# Patient Record
Sex: Male | Born: 1960 | Race: Black or African American | Hispanic: No | Marital: Single | Smoking: Current every day smoker
Health system: Southern US, Community
[De-identification: ages and names within clinical notes are randomized; demographics above are authoritative.]

## PROBLEM LIST (undated history)

## (undated) DIAGNOSIS — M545 Low back pain, unspecified: Secondary | ICD-10-CM

## (undated) DIAGNOSIS — R569 Unspecified convulsions: Secondary | ICD-10-CM

## (undated) DIAGNOSIS — M1990 Unspecified osteoarthritis, unspecified site: Secondary | ICD-10-CM

## (undated) DIAGNOSIS — D649 Anemia, unspecified: Secondary | ICD-10-CM

## (undated) DIAGNOSIS — J45909 Unspecified asthma, uncomplicated: Secondary | ICD-10-CM

## (undated) DIAGNOSIS — F1010 Alcohol abuse, uncomplicated: Secondary | ICD-10-CM

## (undated) DIAGNOSIS — I10 Essential (primary) hypertension: Secondary | ICD-10-CM

## (undated) DIAGNOSIS — J449 Chronic obstructive pulmonary disease, unspecified: Secondary | ICD-10-CM

## (undated) DIAGNOSIS — I82409 Acute embolism and thrombosis of unspecified deep veins of unspecified lower extremity: Secondary | ICD-10-CM

## (undated) DIAGNOSIS — R0602 Shortness of breath: Secondary | ICD-10-CM

## (undated) DIAGNOSIS — G8929 Other chronic pain: Secondary | ICD-10-CM

## (undated) DIAGNOSIS — E119 Type 2 diabetes mellitus without complications: Secondary | ICD-10-CM

## (undated) HISTORY — PX: NO PAST SURGERIES: SHX2092

---

## 2000-01-18 ENCOUNTER — Encounter: Payer: Self-pay | Admitting: Emergency Medicine

## 2000-01-18 ENCOUNTER — Emergency Department (HOSPITAL_COMMUNITY): Admission: EM | Admit: 2000-01-18 | Discharge: 2000-01-19 | Payer: Self-pay | Admitting: Emergency Medicine

## 2001-03-12 ENCOUNTER — Emergency Department (HOSPITAL_COMMUNITY): Admission: EM | Admit: 2001-03-12 | Discharge: 2001-03-12 | Payer: Self-pay | Admitting: Emergency Medicine

## 2002-02-08 ENCOUNTER — Emergency Department (HOSPITAL_COMMUNITY): Admission: EM | Admit: 2002-02-08 | Discharge: 2002-02-08 | Payer: Self-pay | Admitting: Emergency Medicine

## 2002-02-08 ENCOUNTER — Encounter: Payer: Self-pay | Admitting: Emergency Medicine

## 2002-03-08 ENCOUNTER — Emergency Department (HOSPITAL_COMMUNITY): Admission: EM | Admit: 2002-03-08 | Discharge: 2002-03-08 | Payer: Self-pay | Admitting: Emergency Medicine

## 2003-06-28 ENCOUNTER — Emergency Department (HOSPITAL_COMMUNITY): Admission: EM | Admit: 2003-06-28 | Discharge: 2003-06-28 | Payer: Self-pay | Admitting: Emergency Medicine

## 2003-09-10 ENCOUNTER — Encounter: Payer: Self-pay | Admitting: Emergency Medicine

## 2003-09-10 ENCOUNTER — Emergency Department (HOSPITAL_COMMUNITY): Admission: EM | Admit: 2003-09-10 | Discharge: 2003-09-10 | Payer: Self-pay | Admitting: Emergency Medicine

## 2004-10-09 ENCOUNTER — Ambulatory Visit: Payer: Self-pay | Admitting: Nurse Practitioner

## 2004-10-10 ENCOUNTER — Ambulatory Visit: Payer: Self-pay | Admitting: *Deleted

## 2004-11-02 ENCOUNTER — Ambulatory Visit: Payer: Self-pay | Admitting: Nurse Practitioner

## 2004-12-03 ENCOUNTER — Emergency Department (HOSPITAL_COMMUNITY): Admission: AC | Admit: 2004-12-03 | Discharge: 2004-12-04 | Payer: Self-pay
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
## 2005-03-03 ENCOUNTER — Emergency Department (HOSPITAL_COMMUNITY): Admission: EM | Admit: 2005-03-03 | Discharge: 2005-03-03 | Payer: Self-pay | Admitting: Emergency Medicine

## 2005-03-24 ENCOUNTER — Emergency Department (HOSPITAL_COMMUNITY): Admission: EM | Admit: 2005-03-24 | Discharge: 2005-03-24 | Payer: Self-pay | Admitting: Emergency Medicine

## 2006-04-30 ENCOUNTER — Emergency Department (HOSPITAL_COMMUNITY): Admission: EM | Admit: 2006-04-30 | Discharge: 2006-04-30 | Payer: Self-pay | Admitting: Emergency Medicine
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
## 2006-07-01 ENCOUNTER — Emergency Department (HOSPITAL_COMMUNITY): Admission: EM | Admit: 2006-07-01 | Discharge: 2006-07-01 | Payer: Self-pay | Admitting: *Deleted

## 2006-08-29 ENCOUNTER — Emergency Department (HOSPITAL_COMMUNITY): Admission: EM | Admit: 2006-08-29 | Discharge: 2006-08-29 | Payer: Self-pay | Admitting: *Deleted

## 2006-10-01 ENCOUNTER — Emergency Department (HOSPITAL_COMMUNITY): Admission: EM | Admit: 2006-10-01 | Discharge: 2006-10-01 | Payer: Self-pay | Admitting: Emergency Medicine

## 2006-12-11 ENCOUNTER — Emergency Department (HOSPITAL_COMMUNITY): Admission: EM | Admit: 2006-12-11 | Discharge: 2006-12-12 | Payer: Self-pay | Admitting: Emergency Medicine

## 2007-04-29 ENCOUNTER — Inpatient Hospital Stay (HOSPITAL_COMMUNITY): Admission: EM | Admit: 2007-04-29 | Discharge: 2007-05-02 | Payer: Self-pay | Admitting: Emergency Medicine

## 2007-04-29 IMAGING — CR DG HAND 3V BILAT
6 series · 6 of 6 positions shown · non-contrast
Comparison: none

CLINICAL DATA: Bilateral metacarpal pain. 
 BILATERAL HANDS ? 3 VIEW: 
 LEFT HAND:

[x hand ap left]
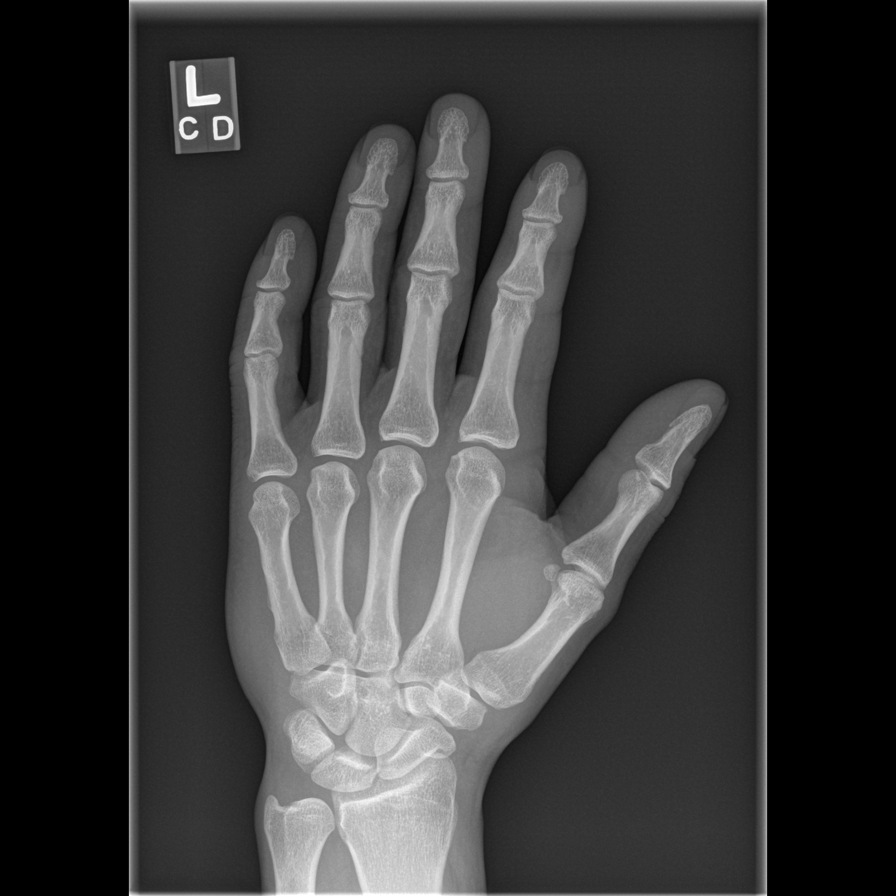

[x hand oblique left]
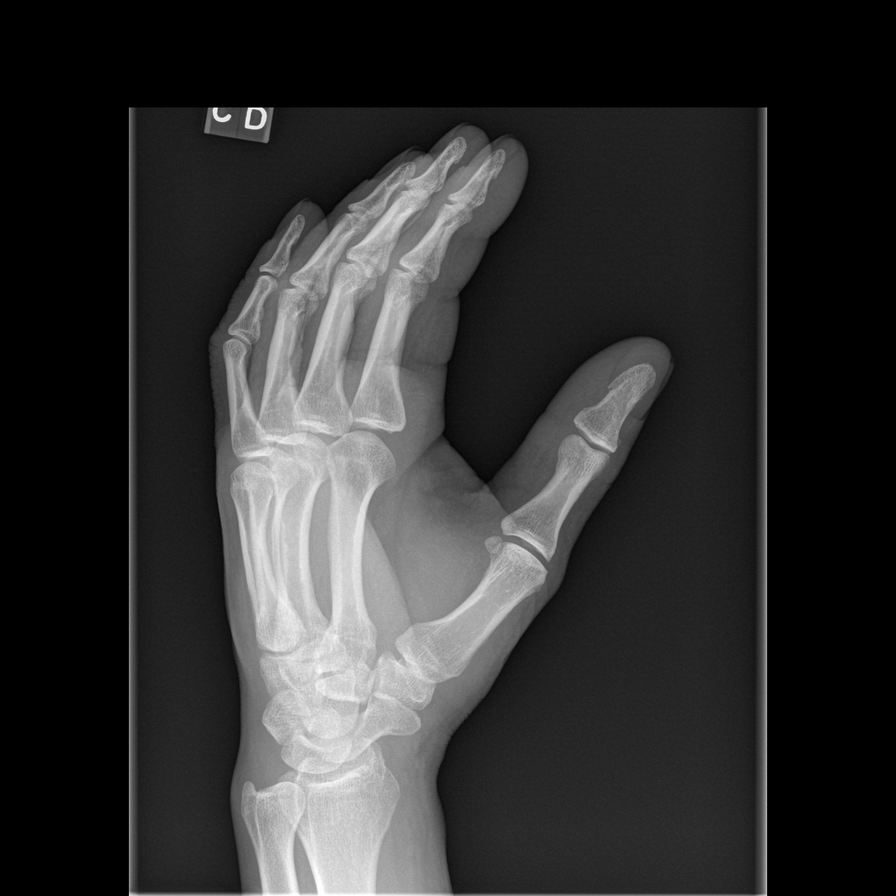

[x hand lat left]
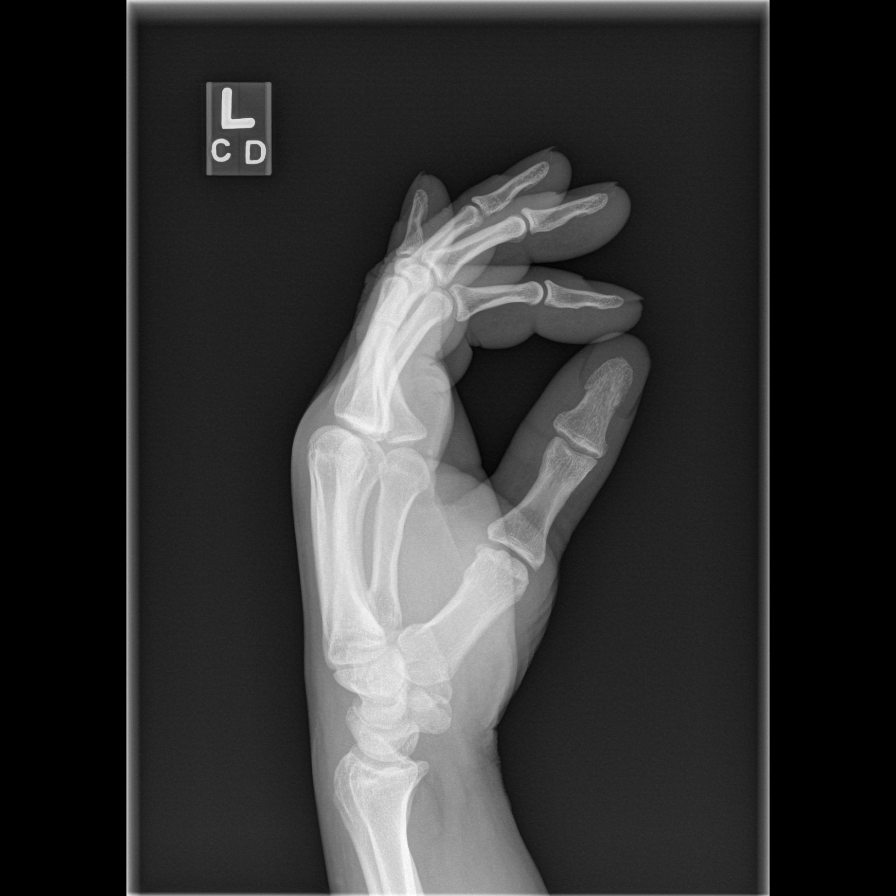

[x hand ap right]
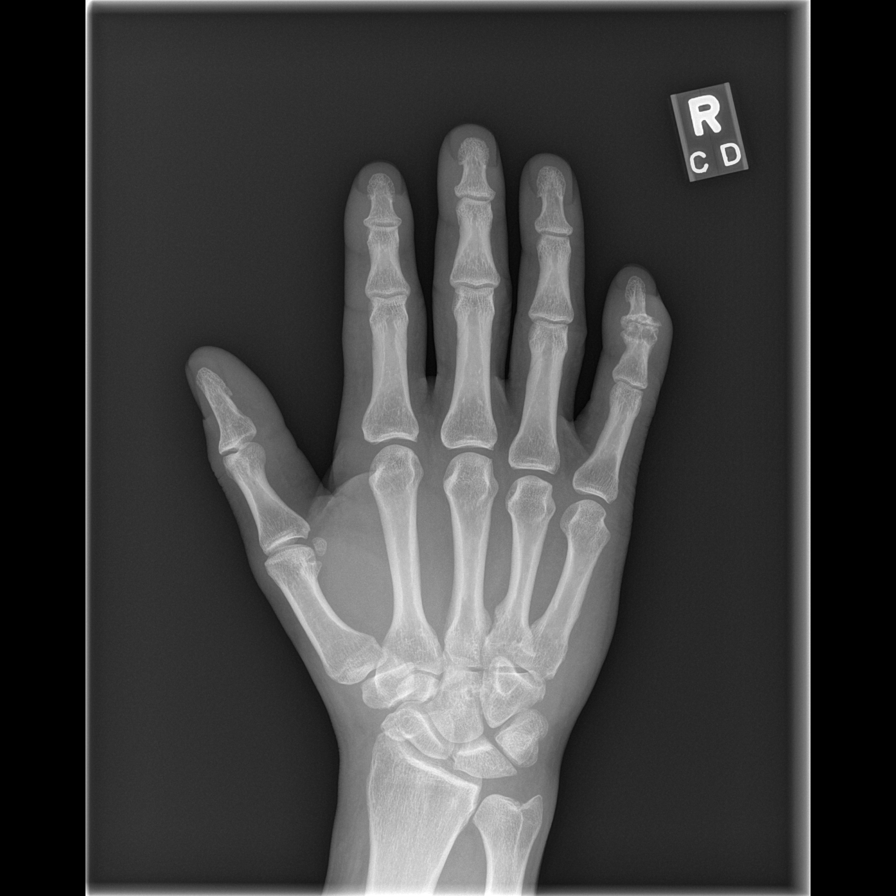

[x hand oblique right]
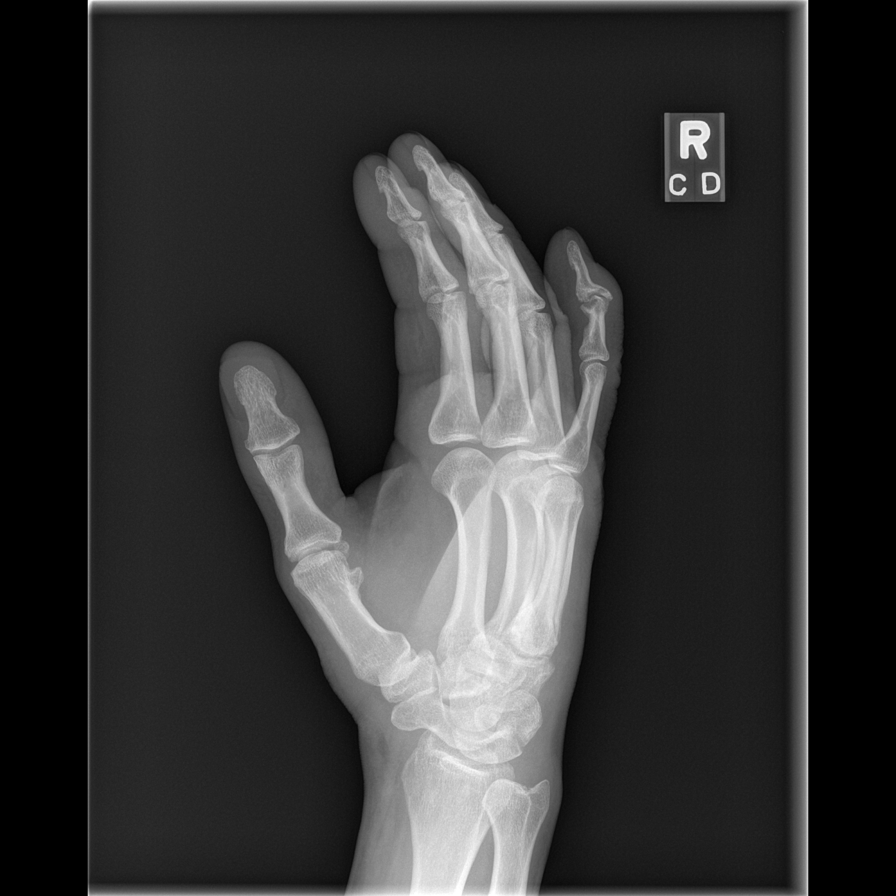

[x hand lat right]
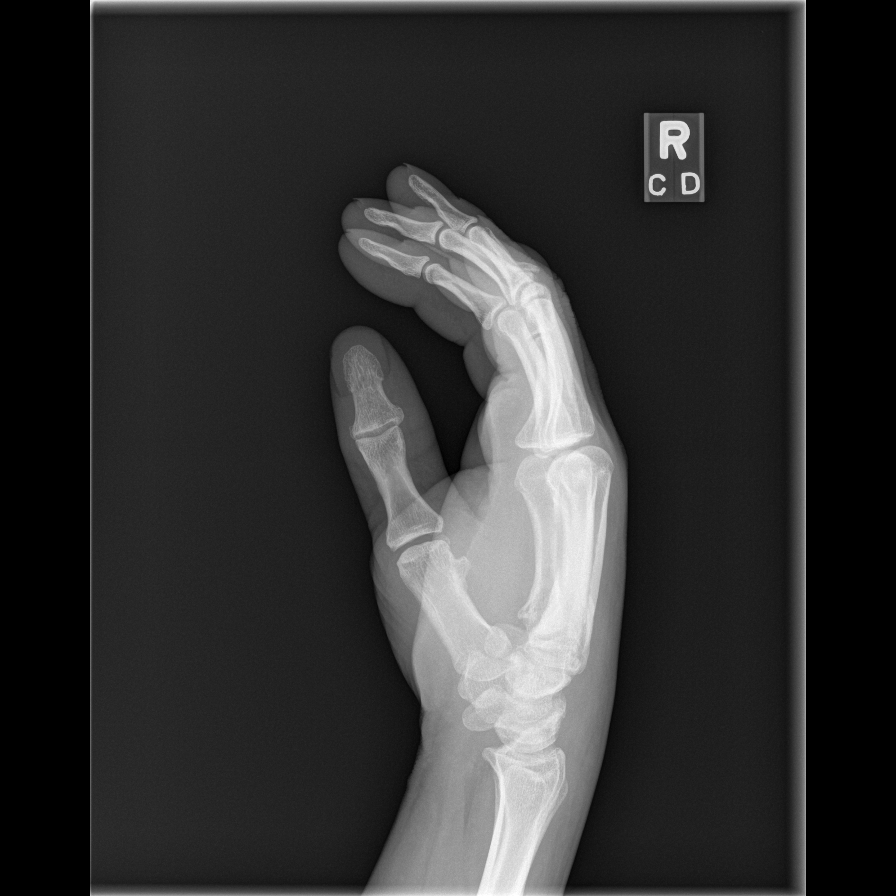

[6 of 6 positions shown; findings below may reference images not displayed]

FINDINGS: MCP joint spaces are well maintained as all of the joint spaces throughout the carpus and fingers.   No erosions are identified.  No soft tissue swelling.  There is no fracture.  
 RIGHT HAND:
 Three views of the right hand also demonstrate well-maintained MCP joint spaces.   There is marked degenerative change of the DIP joint of the little finger with prominent osteophytes identified.  There is no fracture.
IMPRESSION: No acute finding or evidence of inflammatory arthropathy in either hand.  Marked degenerative changes of the dip joint and the little finger of the right hand is noted.

## 2007-07-20 ENCOUNTER — Emergency Department (HOSPITAL_COMMUNITY): Admission: EM | Admit: 2007-07-20 | Discharge: 2007-07-20 | Payer: Self-pay | Admitting: Emergency Medicine

## 2007-08-12 ENCOUNTER — Emergency Department (HOSPITAL_COMMUNITY): Admission: EM | Admit: 2007-08-12 | Discharge: 2007-08-12 | Payer: Self-pay | Admitting: *Deleted

## 2007-08-20 ENCOUNTER — Encounter (INDEPENDENT_AMBULATORY_CARE_PROVIDER_SITE_OTHER): Payer: Self-pay | Admitting: *Deleted

## 2008-02-02 ENCOUNTER — Emergency Department (HOSPITAL_COMMUNITY): Admission: EM | Admit: 2008-02-02 | Discharge: 2008-02-02 | Payer: Self-pay | Admitting: Emergency Medicine

## 2008-03-05 ENCOUNTER — Emergency Department (HOSPITAL_COMMUNITY): Admission: EM | Admit: 2008-03-05 | Discharge: 2008-03-05 | Payer: Self-pay | Admitting: Emergency Medicine

## 2008-06-18 ENCOUNTER — Emergency Department (HOSPITAL_COMMUNITY): Admission: EM | Admit: 2008-06-18 | Discharge: 2008-06-18 | Payer: Self-pay | Admitting: Emergency Medicine

## 2008-06-18 ENCOUNTER — Encounter (INDEPENDENT_AMBULATORY_CARE_PROVIDER_SITE_OTHER): Payer: Self-pay | Admitting: Emergency Medicine

## 2008-06-18 ENCOUNTER — Ambulatory Visit: Payer: Self-pay | Admitting: Vascular Surgery

## 2008-06-18 IMAGING — CR DG SHOULDER 2+V*R*
3 series · 3 of 3 positions shown · non-contrast
Comparison: None

CLINICAL DATA: Pain.  Swelling.

RIGHT SHOULDER - 2+ VIEW

[w shoulder ap external right]
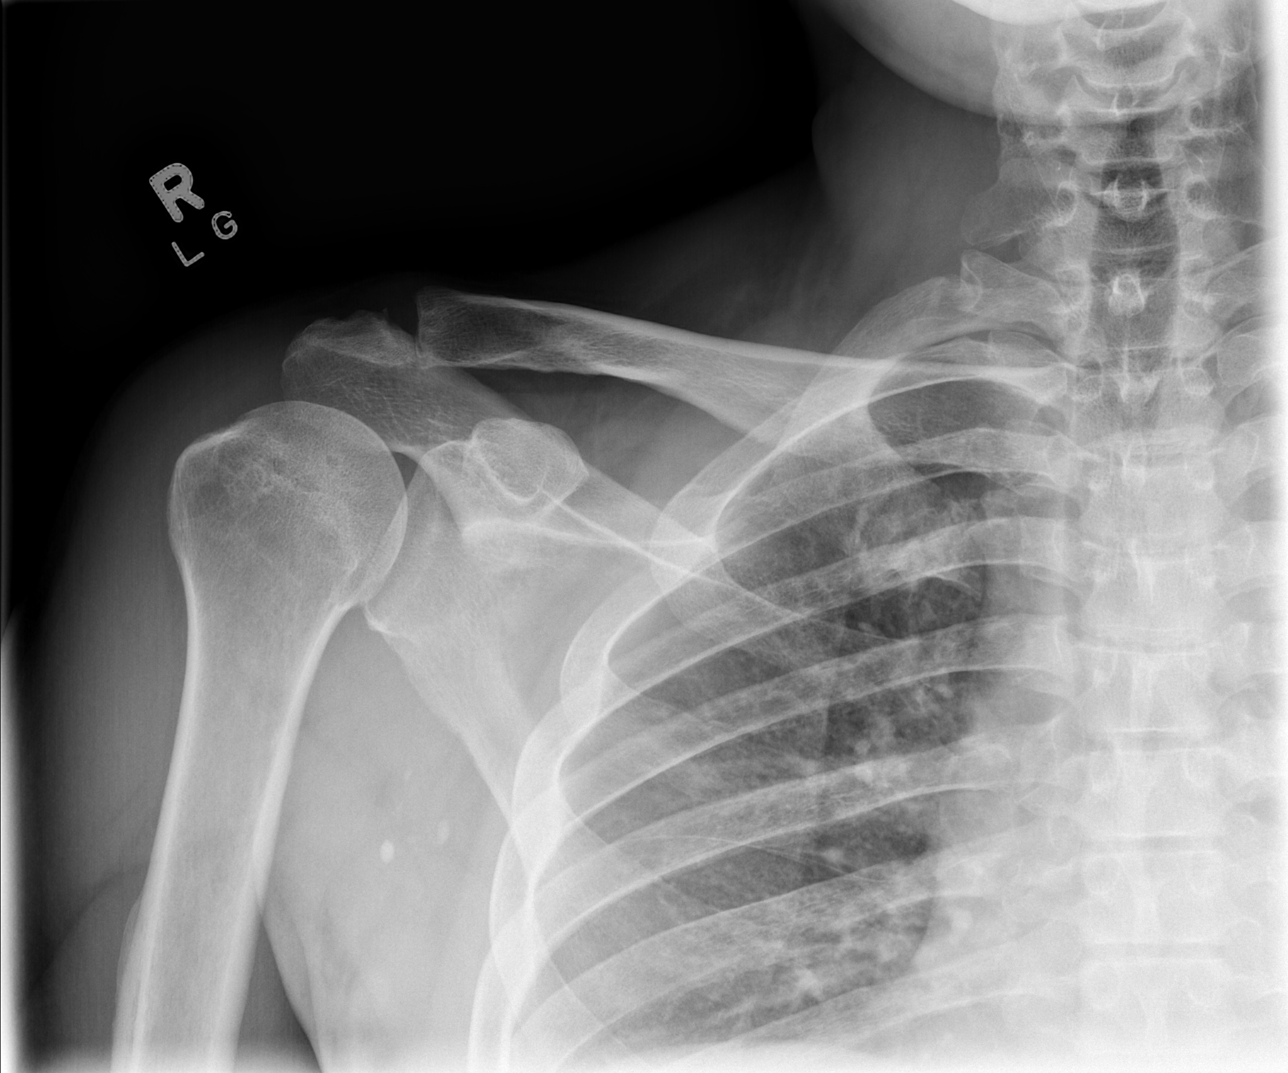

[w shoulder ap internal right]
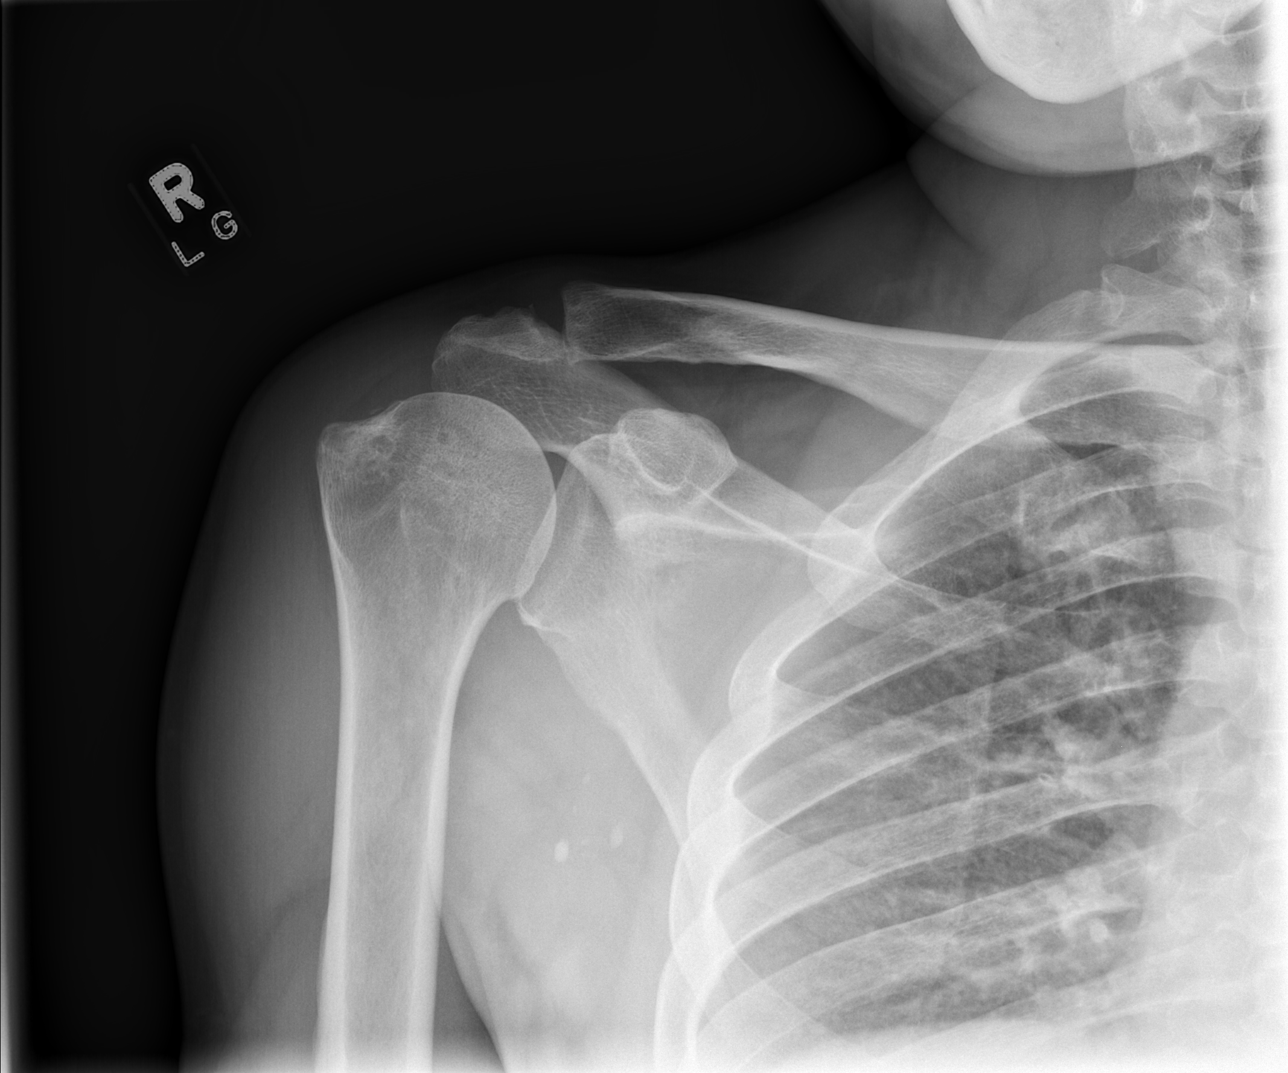

[w shoulder y view right *]
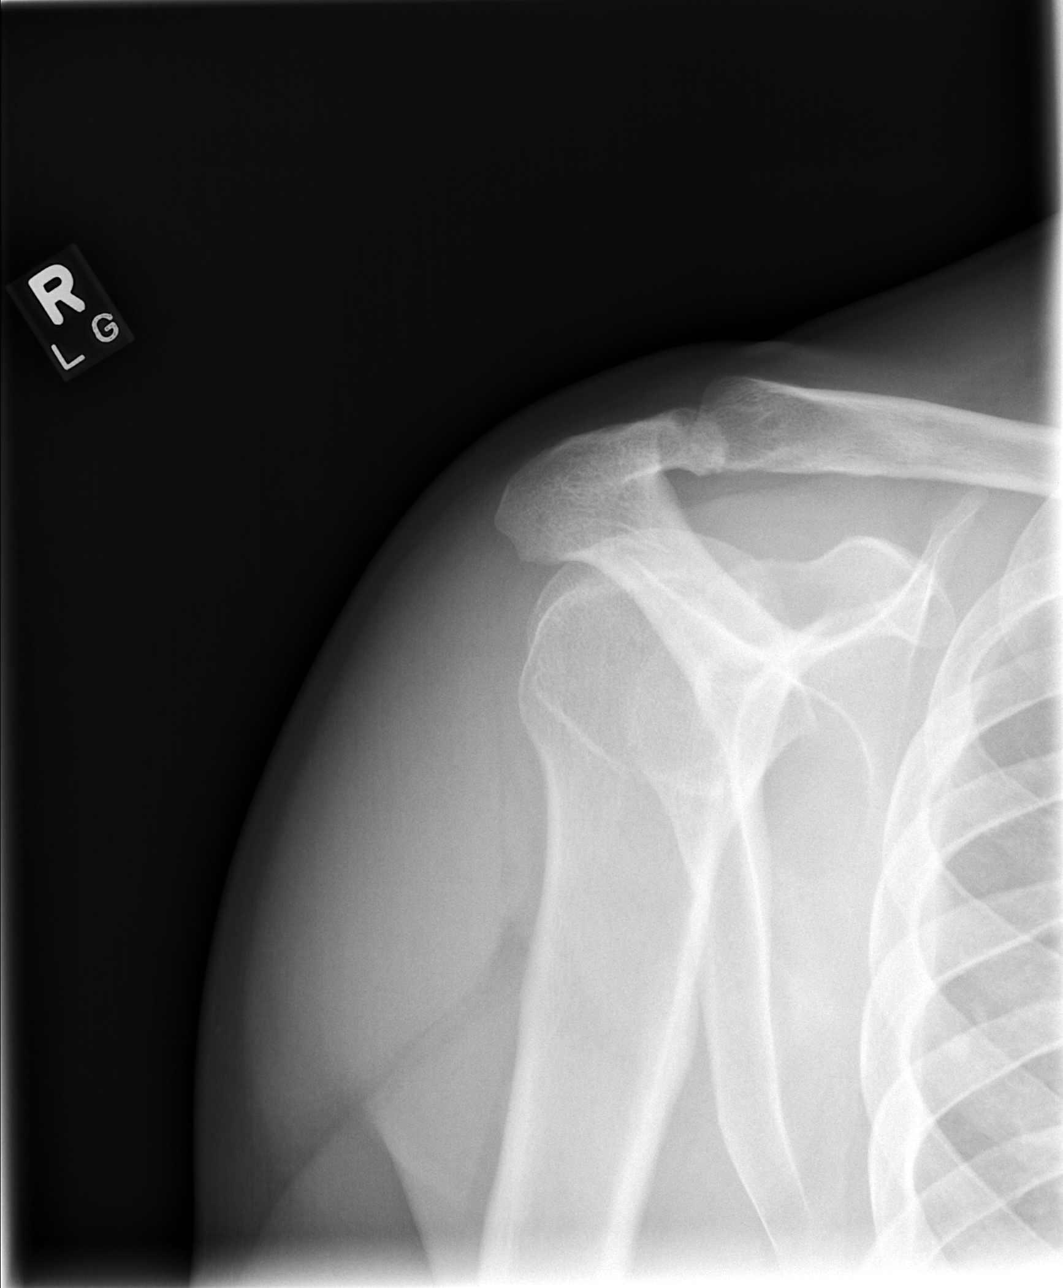

[3 of 3 positions shown; findings below may reference images not displayed]

FINDINGS: Mild acromioclavicular joint degenerative change with
insertional resorptive cysts at the rotator cuff.  No acute
fracture dislocation.  foci of increased density identified about
the right axilla and may be within calcified axillary lymph nodes.
IMPRESSION: 1.  Degenerative change without acute finding about the right
shoulder.
2.  Foci of increased density suspicious for calcified right
axillary lymph nodes.

## 2008-06-18 IMAGING — CR DG CHEST 2V
2 series · 2 of 2 positions shown · non-contrast
Comparison: None

CLINICAL DATA: right arm swelling and pain.

CHEST - 2 VIEW

[w chest pa]
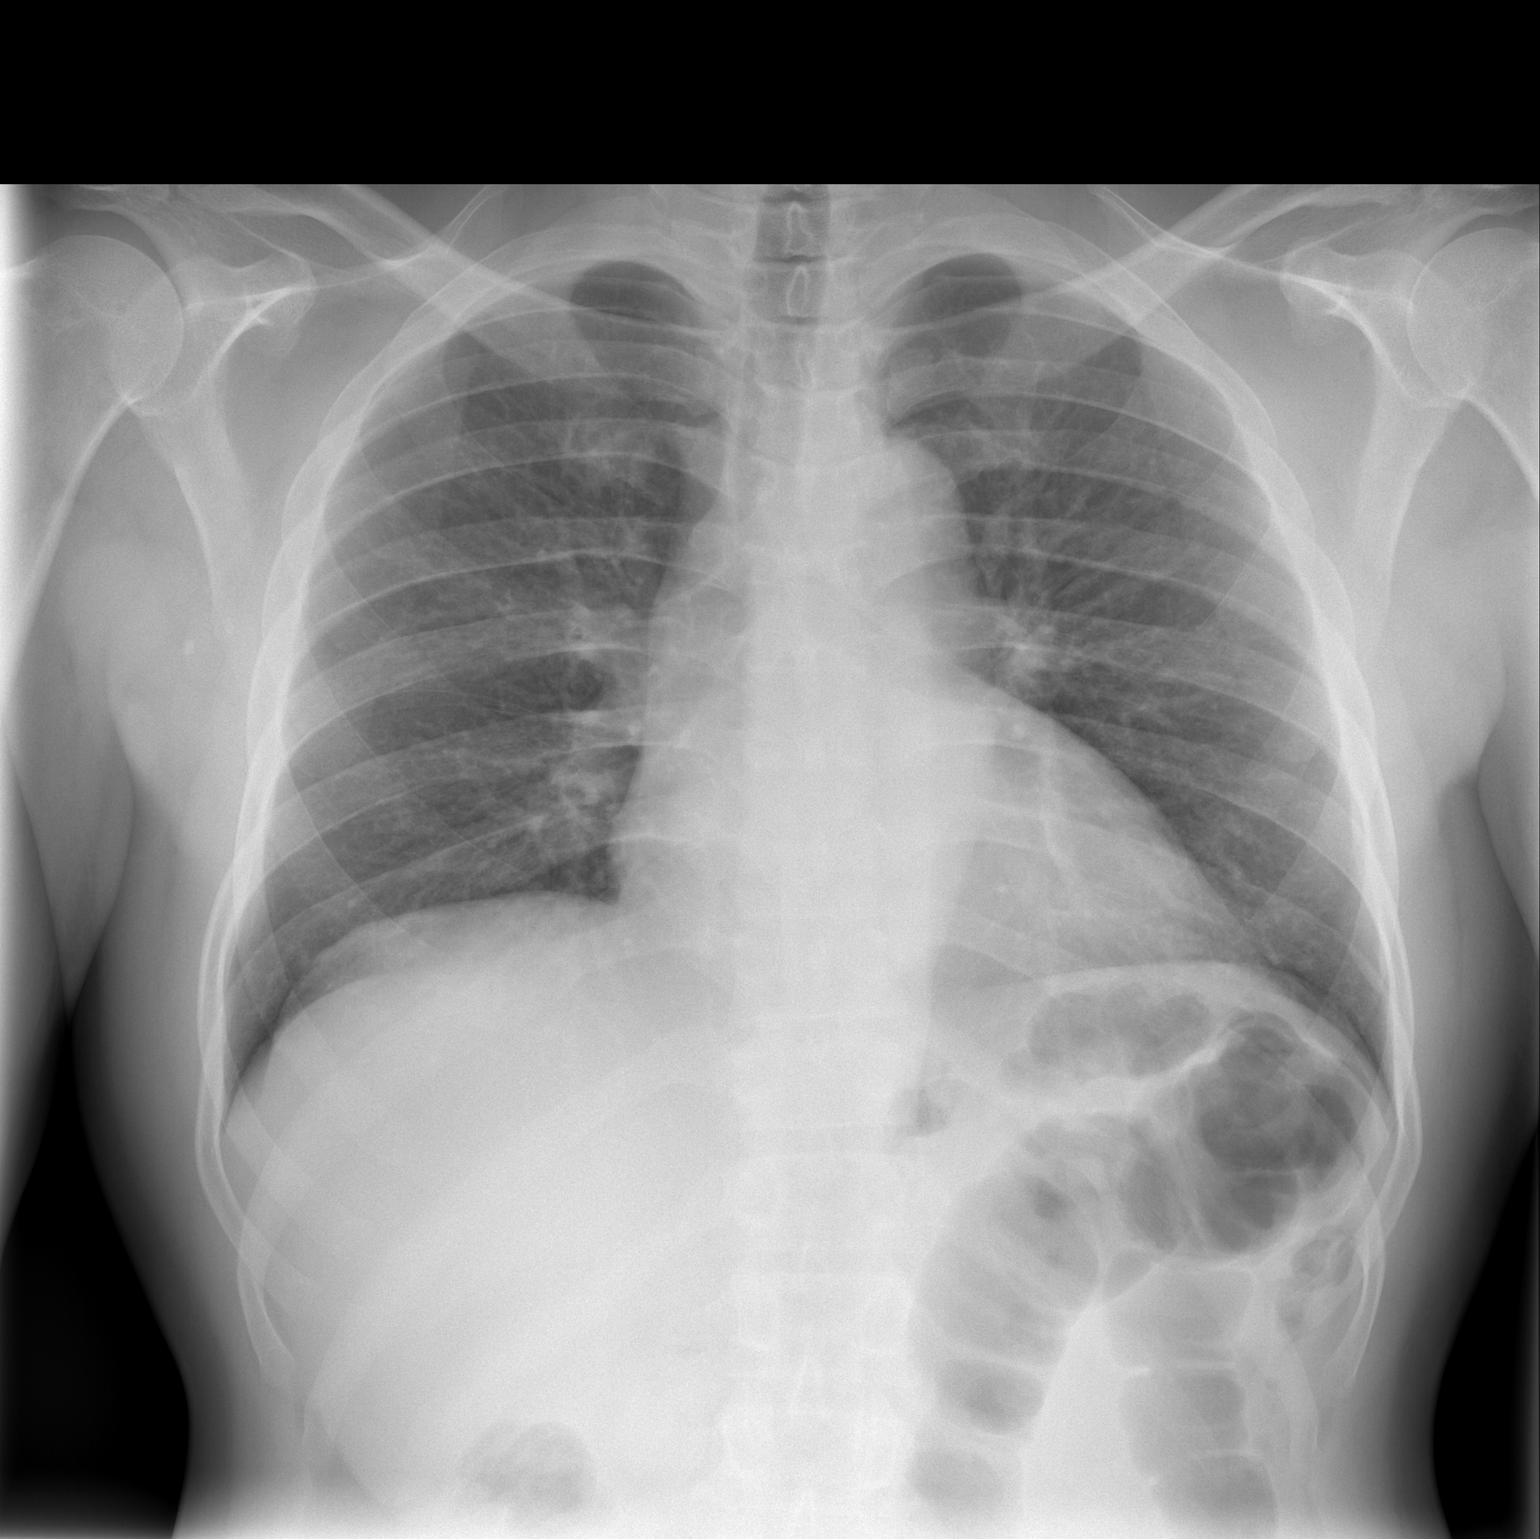

[w chest lat]
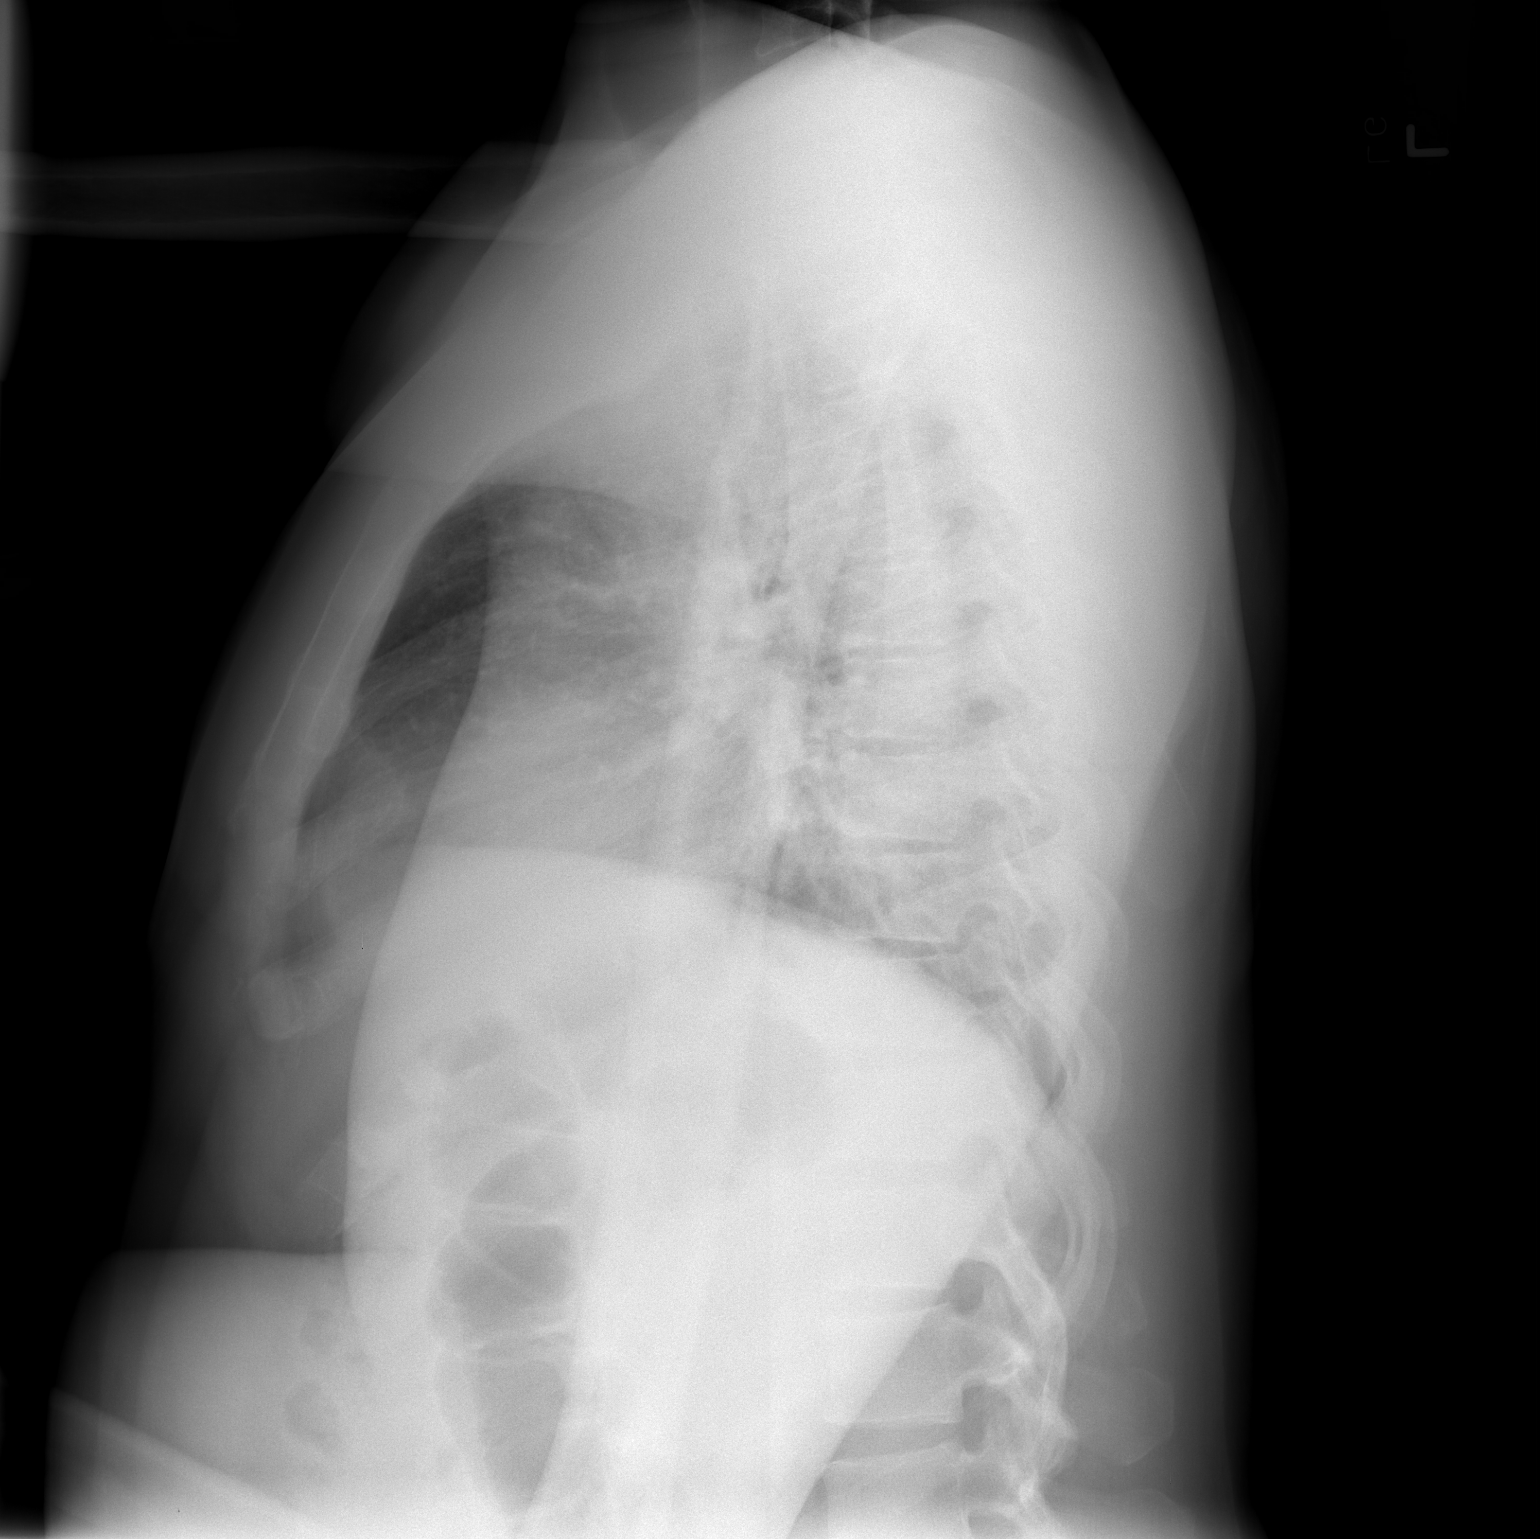

[2 of 2 positions shown; findings below may reference images not displayed]

FINDINGS: Lateral view limited as the patient is not able to raise
the arm.  Question foci of increased density projecting over the
right axilla. Midline trachea. Heart size upper limits of normal.
Normal mediastinal contours. No pleural effusion or pneumothorax.
Low lung volumes with resultant pulmonary interstitial prominence.
Mildly prominent loops of bowel in the left upper quadrant
incidentally noted.
IMPRESSION: 1. No acute cardiopulmonary disease.
2.  Possible foci of increased density project over the right
axilla.  Indeterminate.
3.  Prominent left upper quadrant bowel loops without free
intraperitoneal air.

## 2008-06-18 IMAGING — CR DG HAND COMPLETE 3+V*R*
3 series · 3 of 3 positions shown · non-contrast
Comparison: None

CLINICAL DATA: Inflamed right hand.  No known injury.  Patient
unable to straighten fingers.

RIGHT HAND - COMPLETE 3+ VIEW

[x hand ap right]
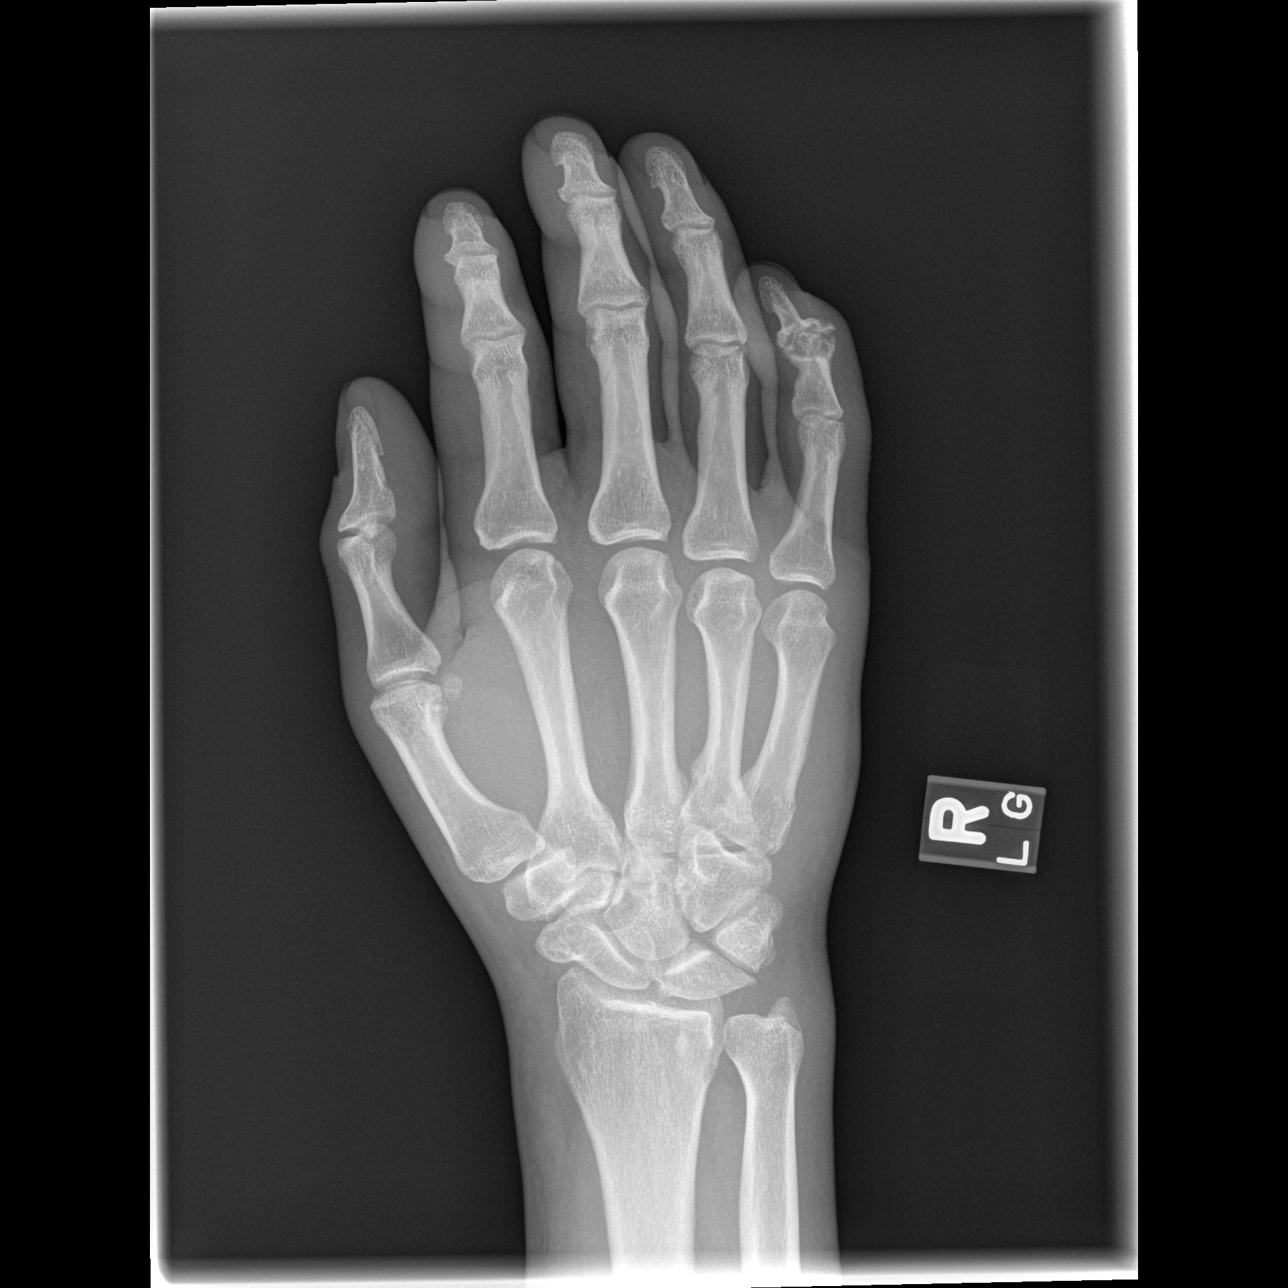

[x hand oblique right]
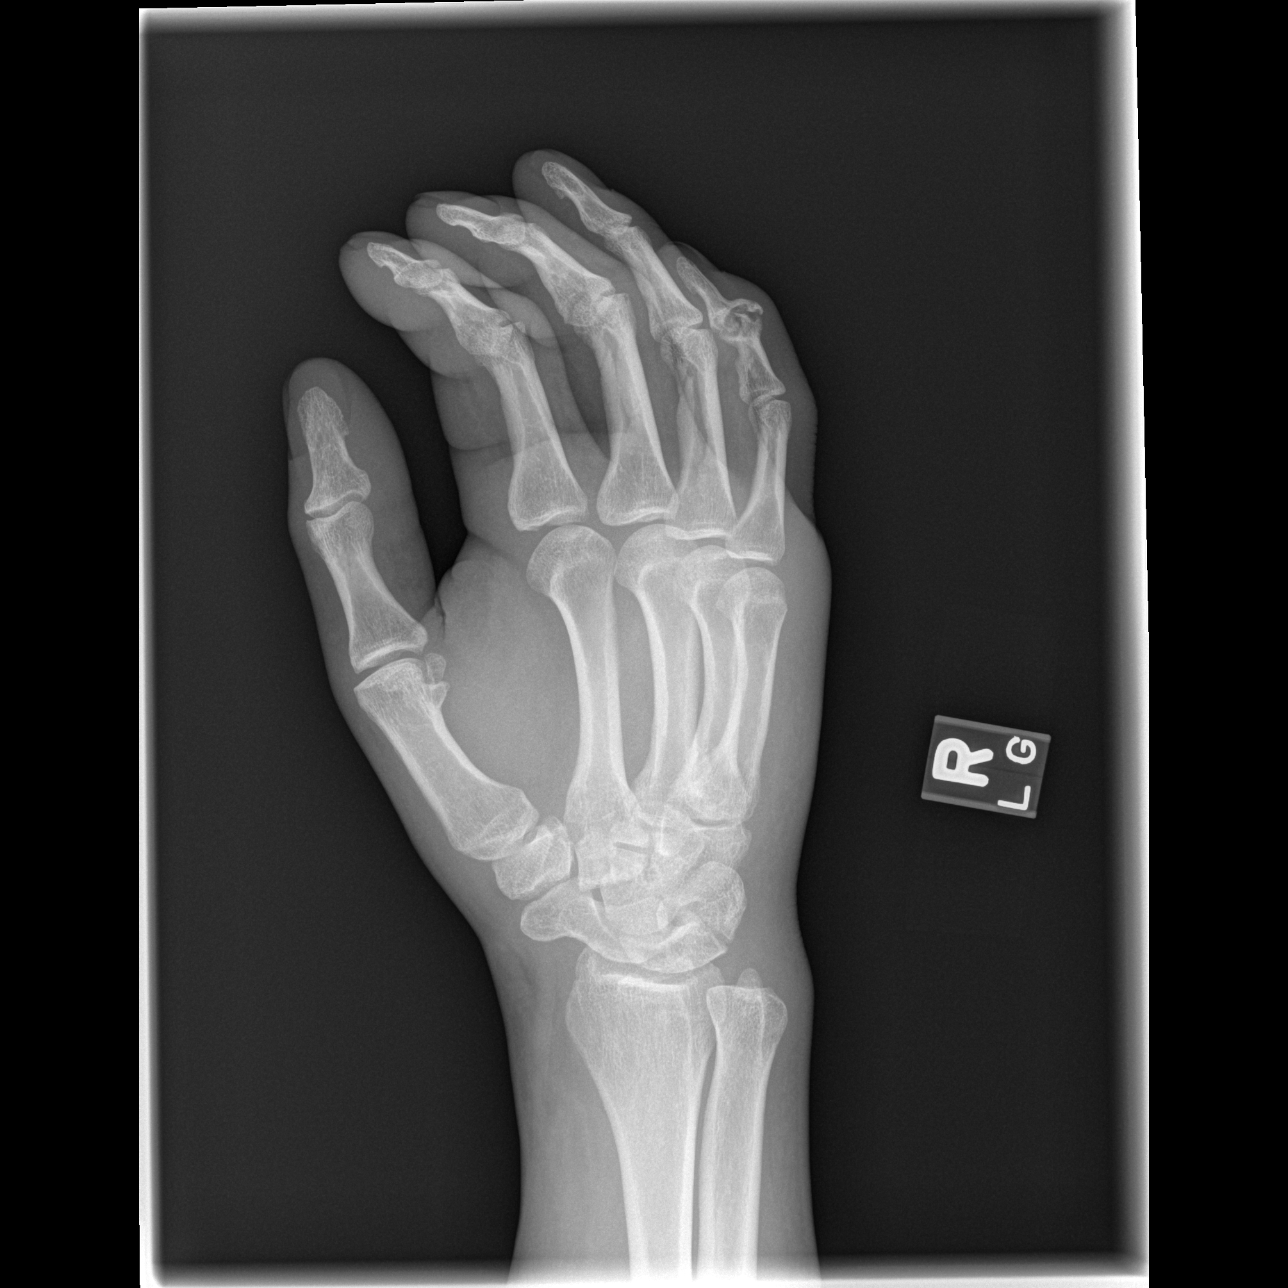

[x hand lat right]
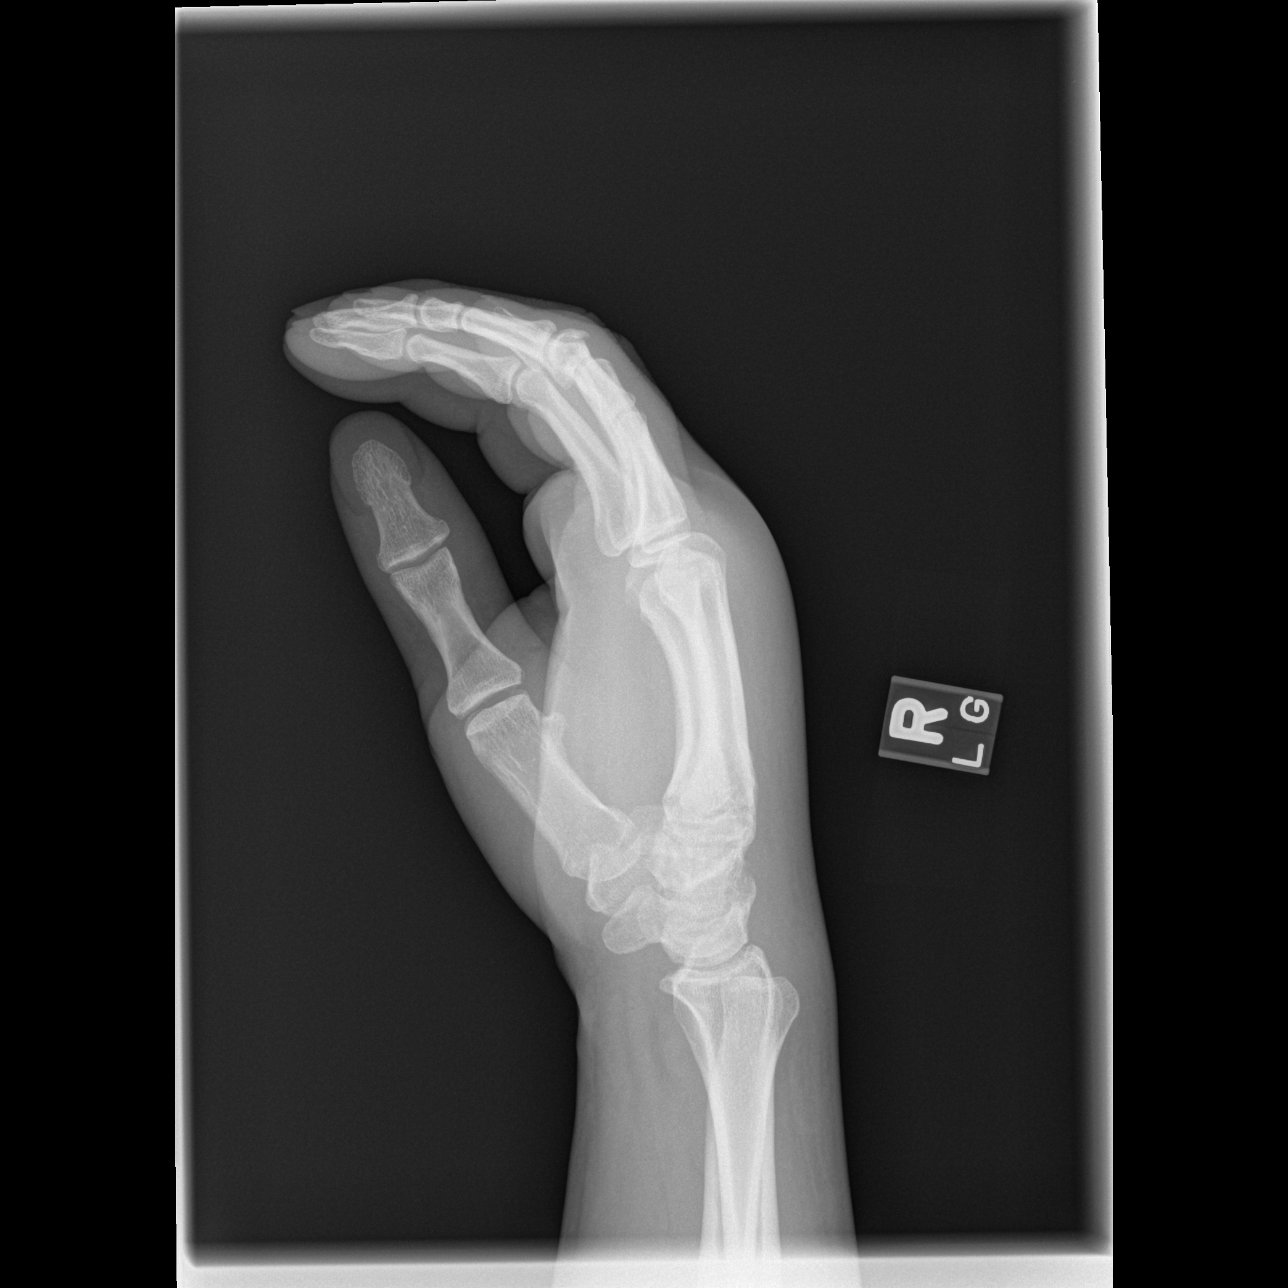

[3 of 3 positions shown; findings below may reference images not displayed]

FINDINGS: Limited by patient positioning with fingers not extended.
Diffuse soft tissue swelling about the metacarpal especially
dorsally.  The fingers are not separated on the lateral view which
limits evaluation.  Abnormal appearance of the distal
interphalangeal joint of the fifth ray.  Suboptimally evaluated.
Probable joint space narrowing with osseous irregularity and
probable overgrowth about the margin of the joint.
IMPRESSION: 1.  Limited by patient positioning.
2.  Diffuse soft tissue swelling without acute
fracture/dislocation.
3.  Abnormal appearance of the distal interphalangeal joint of the
fifth ray.  Possibilities include remote trauma/age advanced
degenerative change, inflammatory arthritis (question psoriatic
arthritis), or inflammatory osteoarthritis.

## 2008-07-09 ENCOUNTER — Emergency Department (HOSPITAL_COMMUNITY): Admission: EM | Admit: 2008-07-09 | Discharge: 2008-07-09 | Payer: Self-pay | Admitting: Emergency Medicine

## 2008-07-14 ENCOUNTER — Emergency Department (HOSPITAL_COMMUNITY): Admission: EM | Admit: 2008-07-14 | Discharge: 2008-07-14 | Payer: Self-pay | Admitting: *Deleted

## 2008-07-14 IMAGING — CR DG CHEST 2V
2 series · 2 of 2 positions shown · non-contrast
Comparison: [DATE]

CLINICAL DATA: Chest pain/short of breath

CHEST - 2 VIEW

[w chest pa]
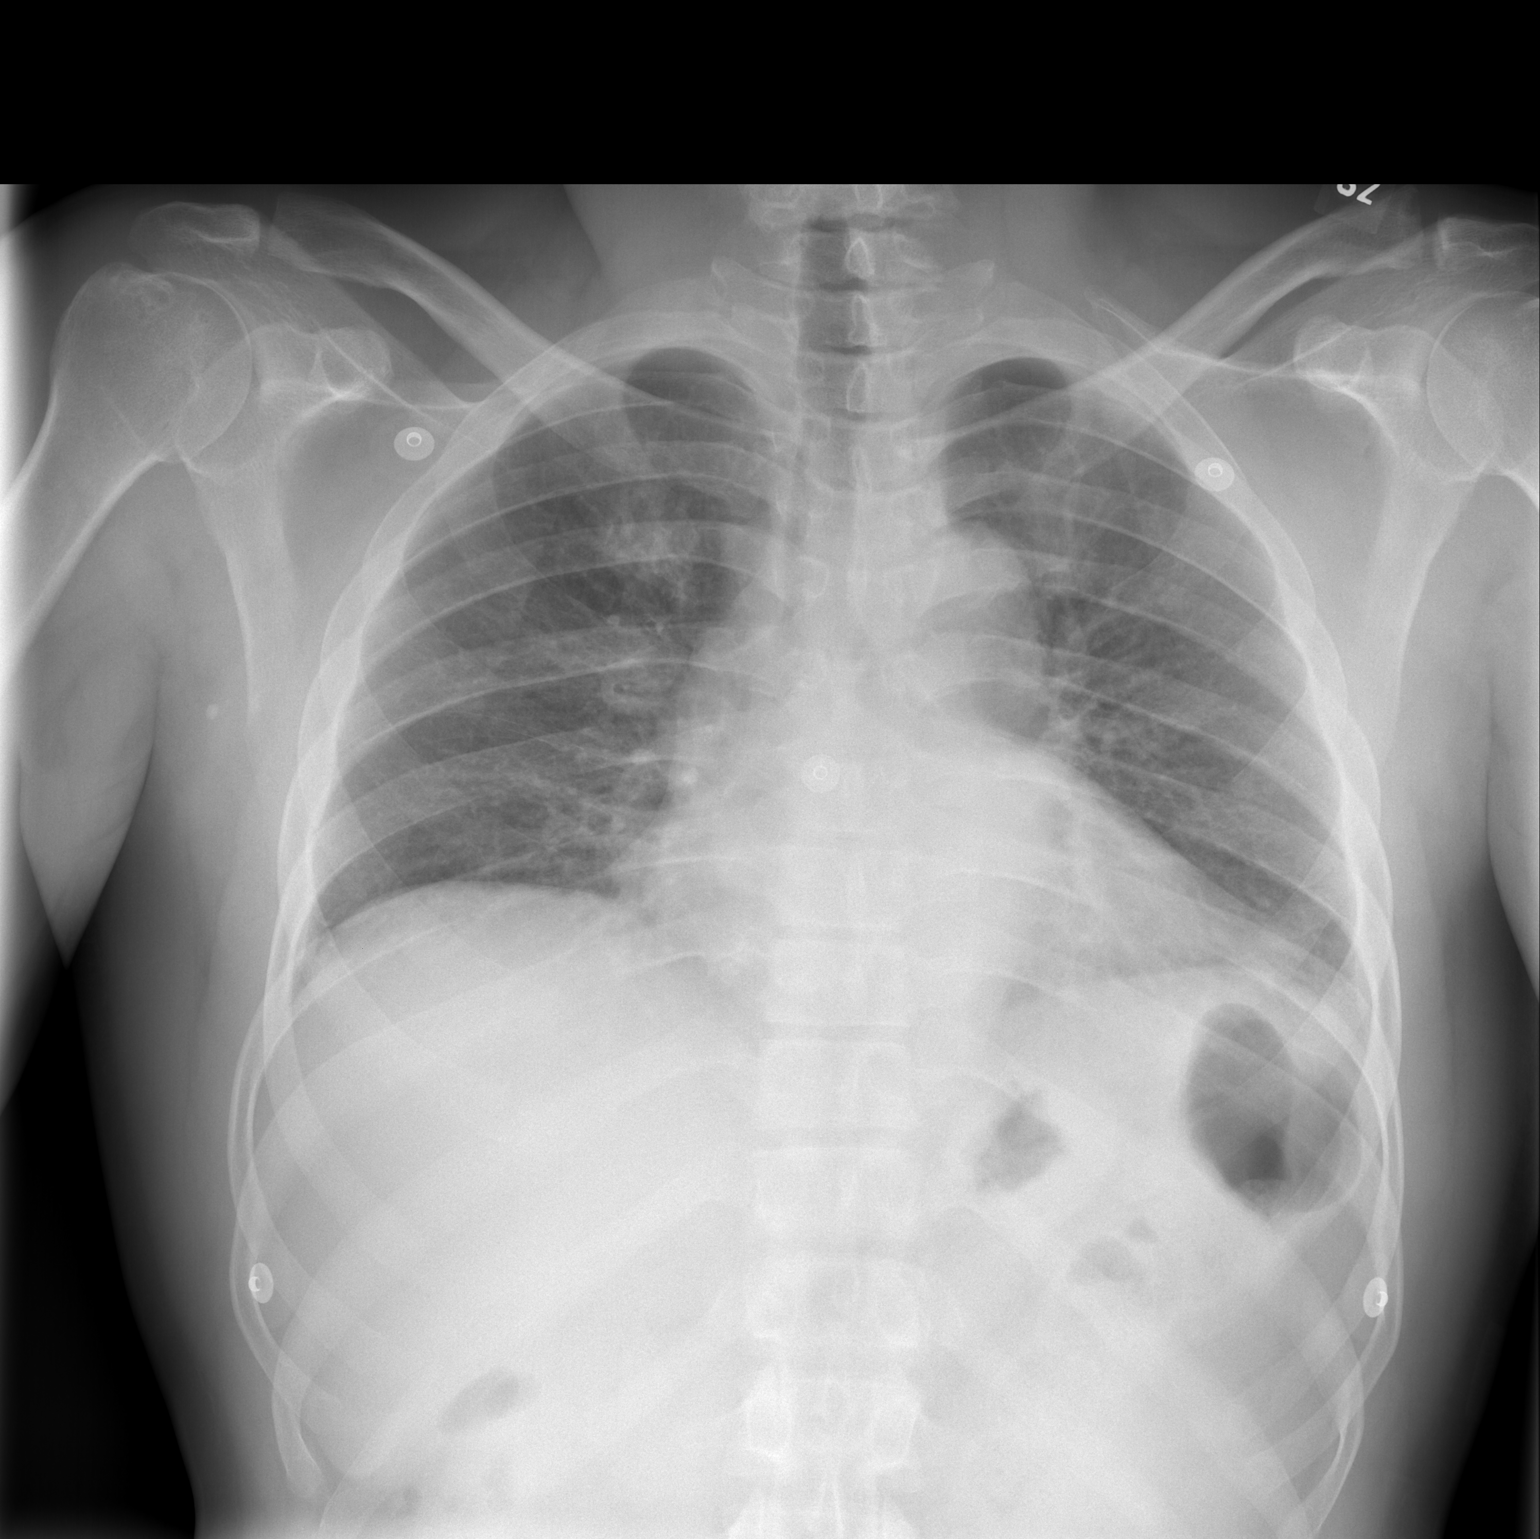

[w chest lat]
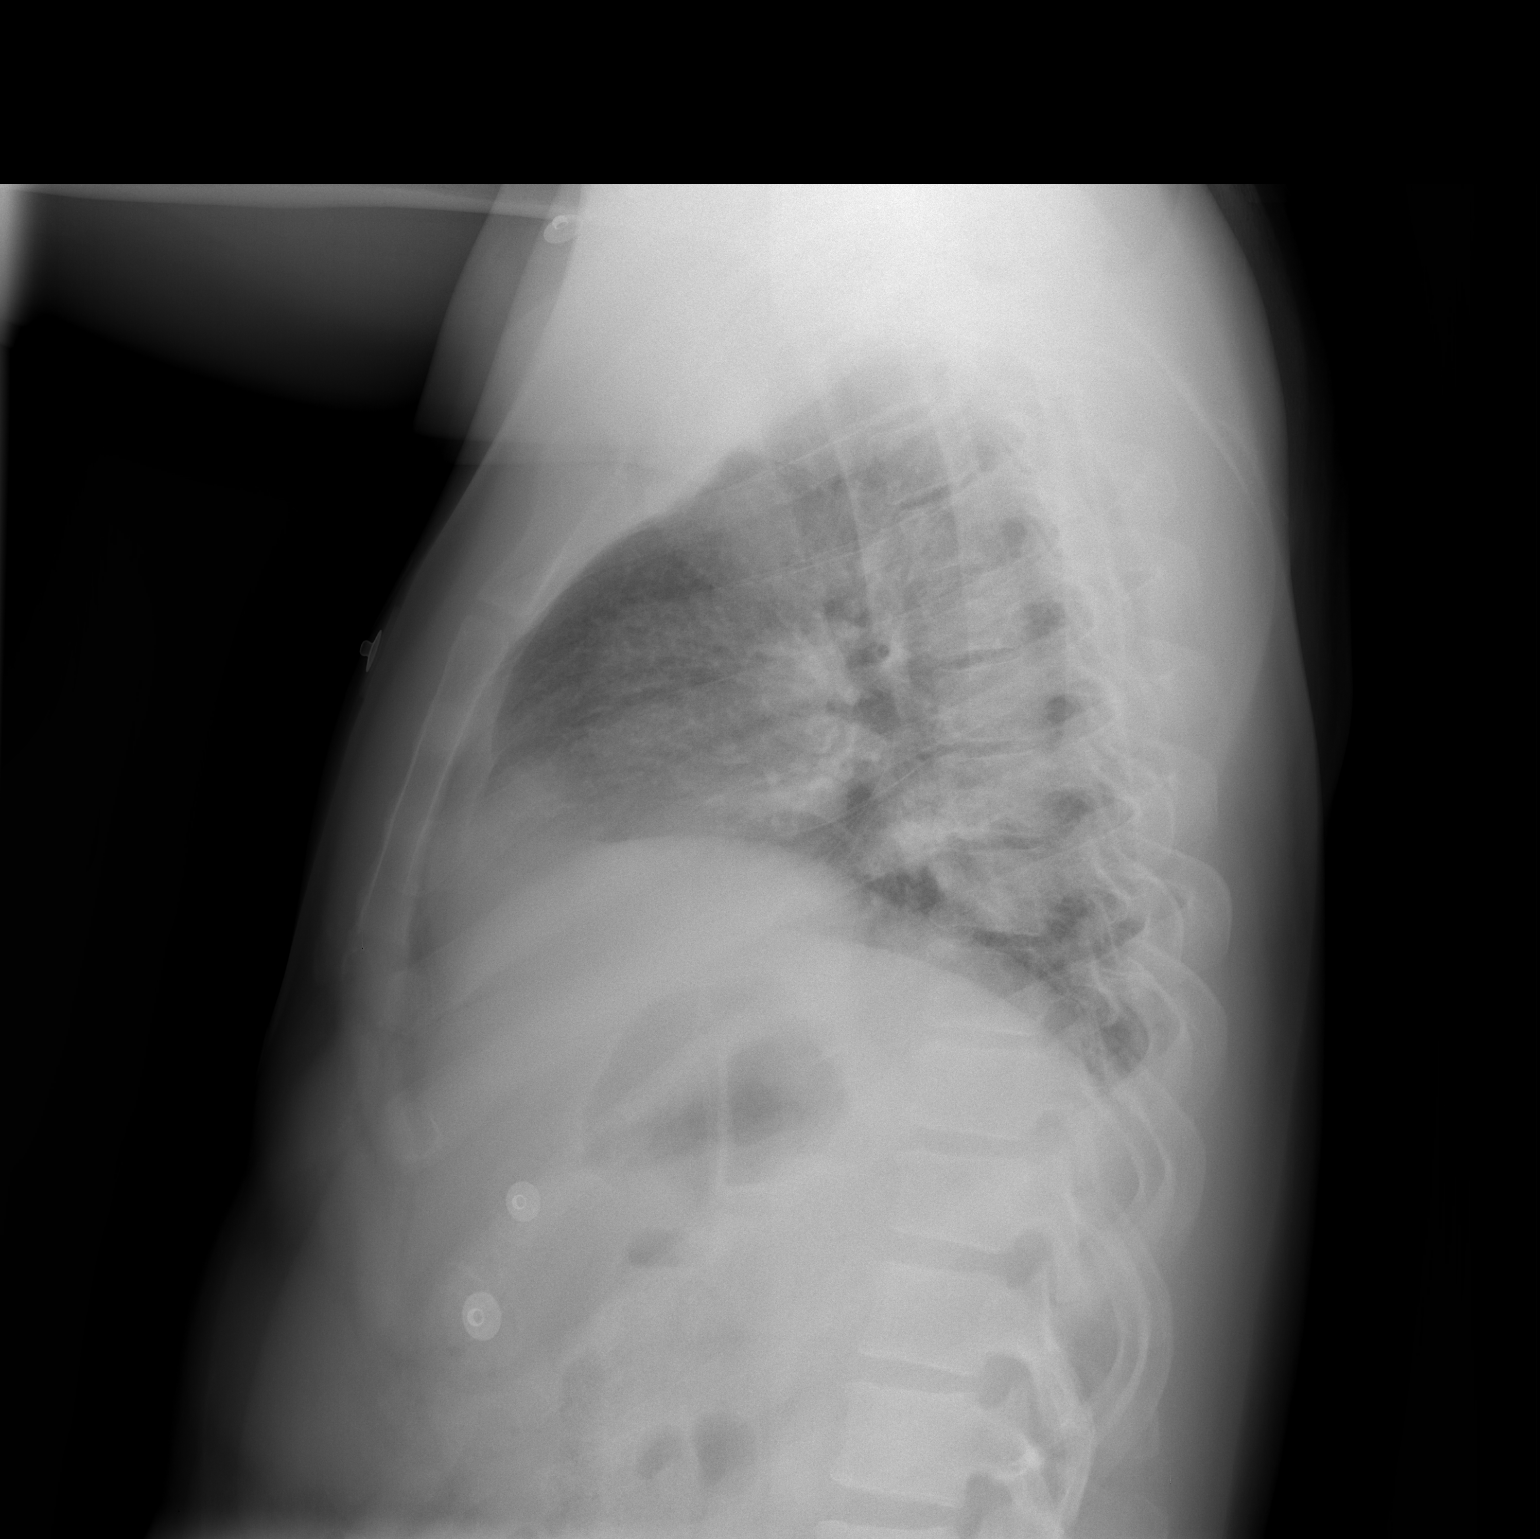

[2 of 2 positions shown; findings below may reference images not displayed]

FINDINGS: Suboptimal level of inspiration.  No heart upper normal.
There is crowding of the pulmonary markings due to shallow
inspiration.  There is no definite congestive heart failure or
pneumonia.  No pleural fluid or osseous lesions.
IMPRESSION: Suboptimal level of inspiration with some volume loss at the bases
- no definite pneumonia or congestive heart failure.

## 2009-03-28 ENCOUNTER — Emergency Department (HOSPITAL_COMMUNITY): Admission: EM | Admit: 2009-03-28 | Discharge: 2009-03-29 | Payer: Self-pay | Admitting: *Deleted

## 2009-03-29 IMAGING — CT CT ANGIO CHEST
2 of 5 series · 19 of 36 positions shown · IV contrast (APPLIED)
Comparison: Today's chest x-ray.

CLINICAL DATA: Right-sided pain.

CT ANGIOGRAPHY CHEST
TECHNIQUE: Multidetector CT imaging of the chest was performed
using the standard protocol during bolus administration of
intravenous contrast. Multiplanar CT image reconstructions
including MIPs were obtained to evaluate the vascular anatomy.
Contrast: 100 ml [WB]

[Series 8: pulm embolism 1.0 b25f thins · axial · 0.66mm/px · z∈[+1116,+1328]mm · 16 of 236 slices shown]
[im 12/236  lung]
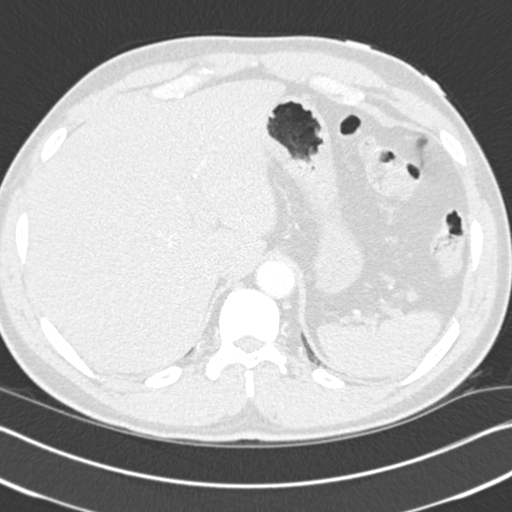
[im 24/236  mediastinal]
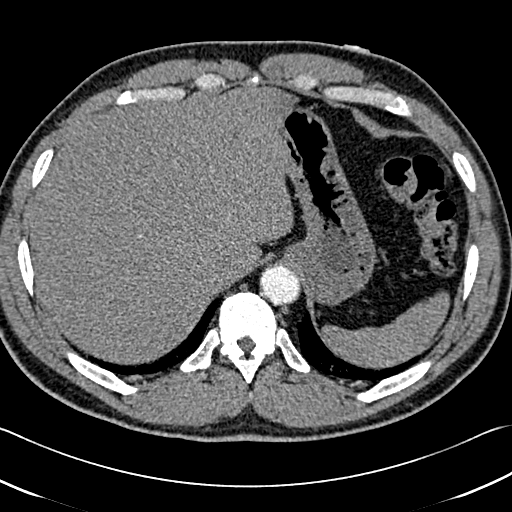
[im 36/236  lung]
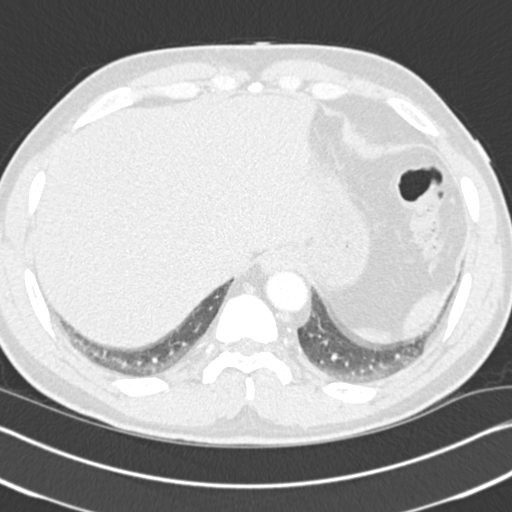
[im 59/236  mediastinal]
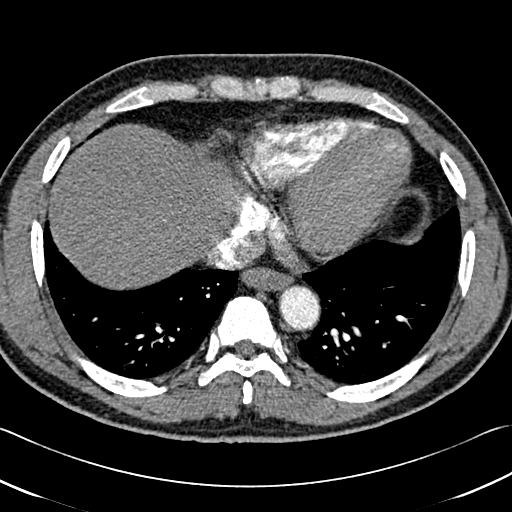
[im 71/236  lung]
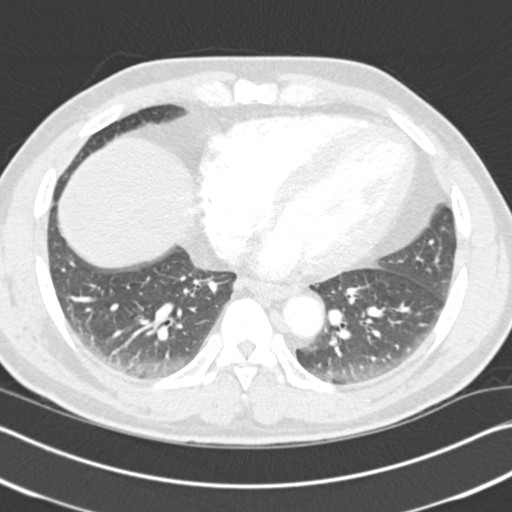
[im 83/236  mediastinal]
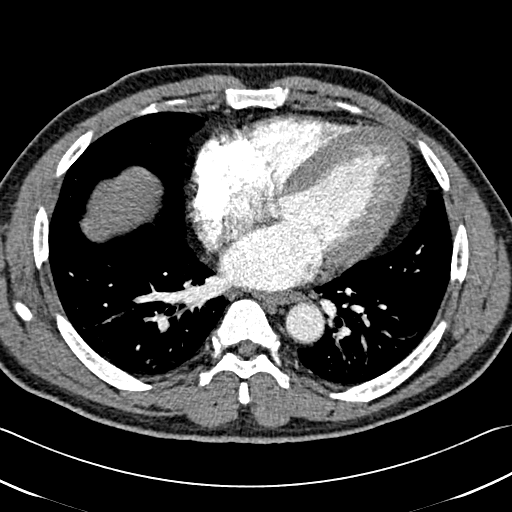
[im 95/236  lung]
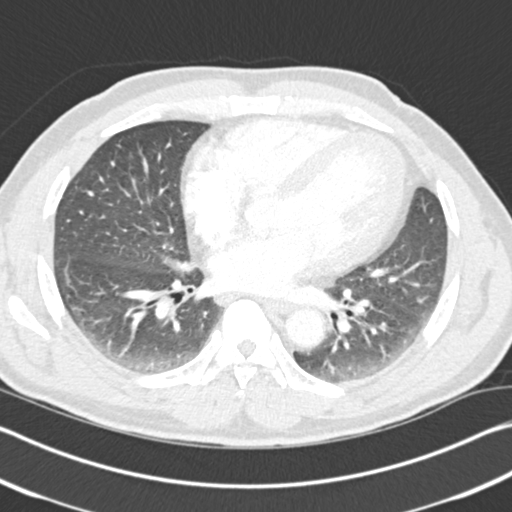
[im 106/236  mediastinal]
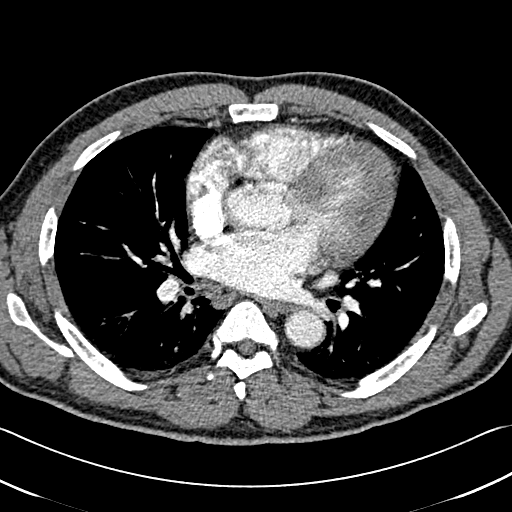
[im 130/236  lung]
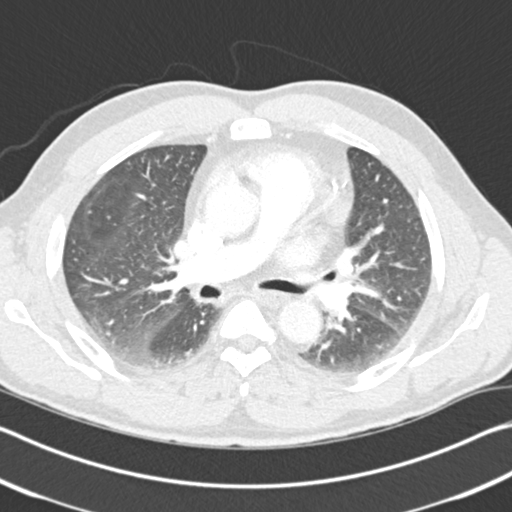
[im 142/236  mediastinal]
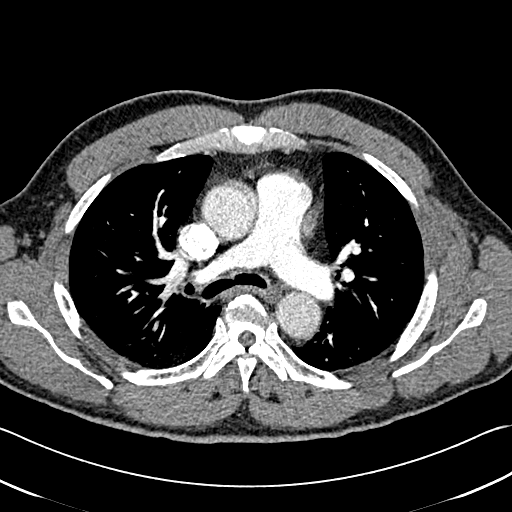
[im 153/236  lung]
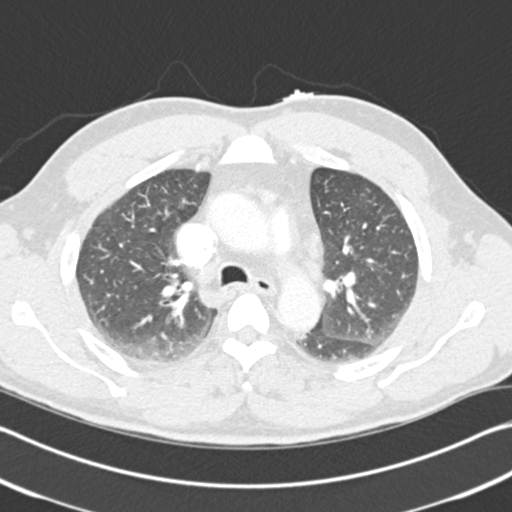
[im 165/236  mediastinal]
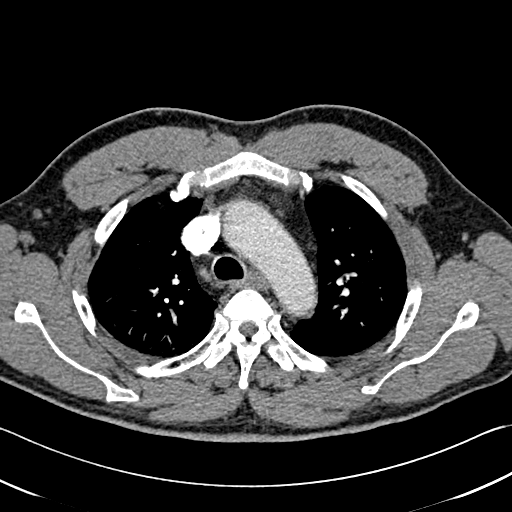
[im 177/236  lung]
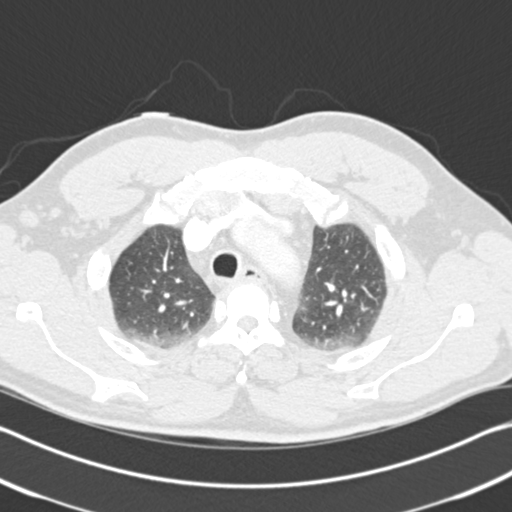
[im 200/236  mediastinal]
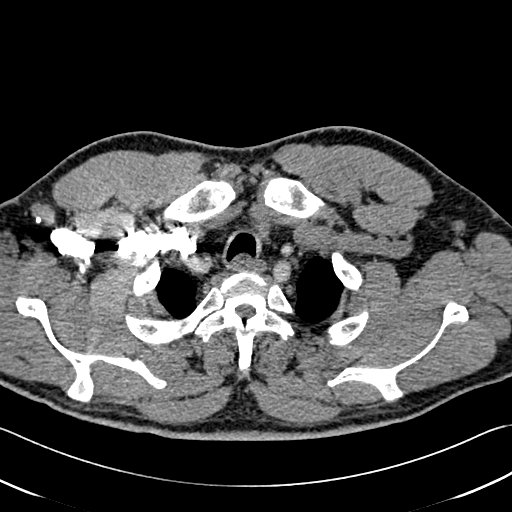
[im 212/236  lung]
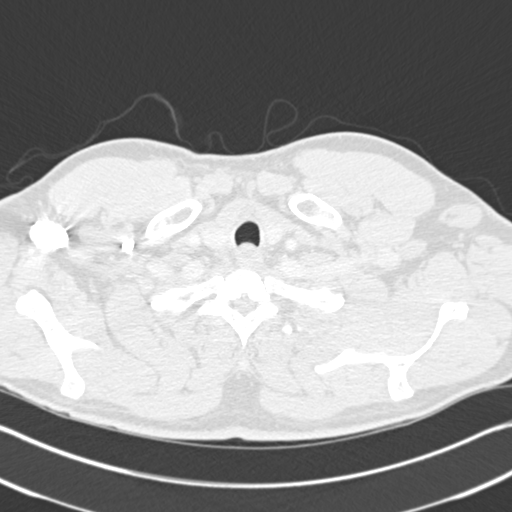
[im 224/236  mediastinal]
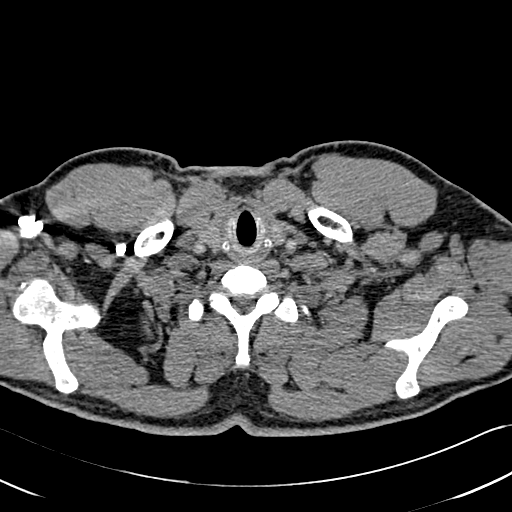

[Series 602: cor · coronal · 0.66mm/px · 3 of 105 slices shown]
[im 21/105  mediastinal]
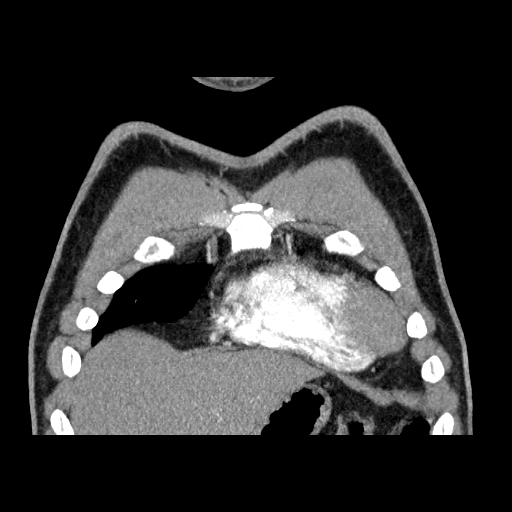
[im 42/105  mediastinal]
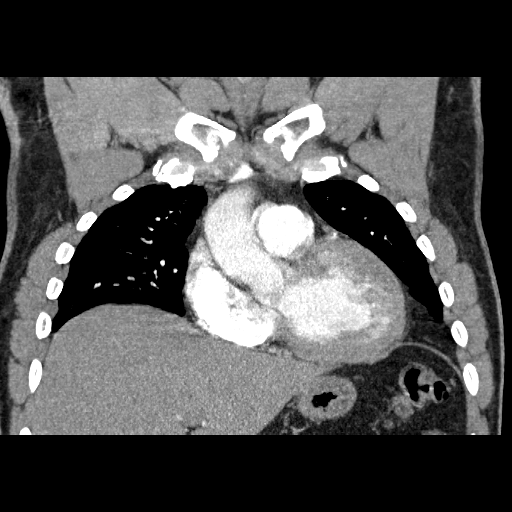
[im 63/105  mediastinal]
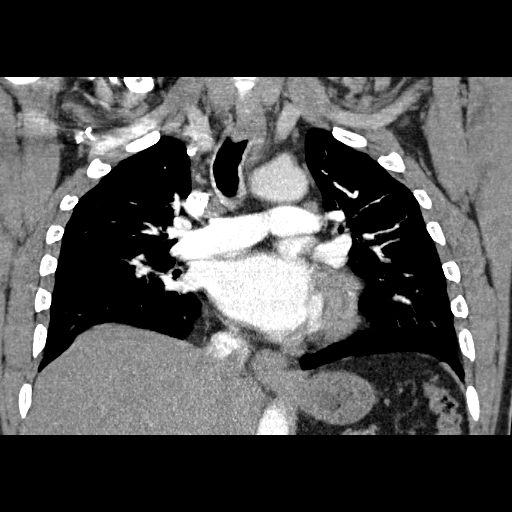

[19 of 36 positions shown; findings below may reference images not displayed]

FINDINGS: No filling defects in the pulmonary arteries to suggest
pulmonary emboli.  There is cardiomegaly.  Coronary artery
calcifications are present.  Small scattered mediastinal lymph
nodes, none pathologically enlarged.  There are mildly enlarged
bilateral axillary nodes.  Index left axillary node measures 2.8 x
1.8 cm on image 9.

No pleural or pericardial effusion.  Clustered tiny nodules in the
right upper lobe, most likely postinflammatory.  Dependent ground-
glass opacities in the lungs, likely dependent atelectasis.

Imaging into the upper abdomen shows no acute findings.  No acute
bony abnormality.

 Review of the MIP images confirms the above findings.
IMPRESSION: No evidence of pulmonary embolus.

Cardiomegaly, coronary artery calcifications.

A few clustered small nodules in the right upper lobe, most likely
infectious or inflammatory.

Borderline and mildly enlarged bilateral axillary adenopathy.
Recommend close clinical follow-up.

## 2009-03-29 IMAGING — CR DG SHOULDER 2+V*R*
3 series · 3 of 3 positions shown · non-contrast
Comparison: [DATE]

CLINICAL DATA: Right shoulder pain.

RIGHT SHOULDER - 2+ VIEW

[t shoulder ap internal righ]
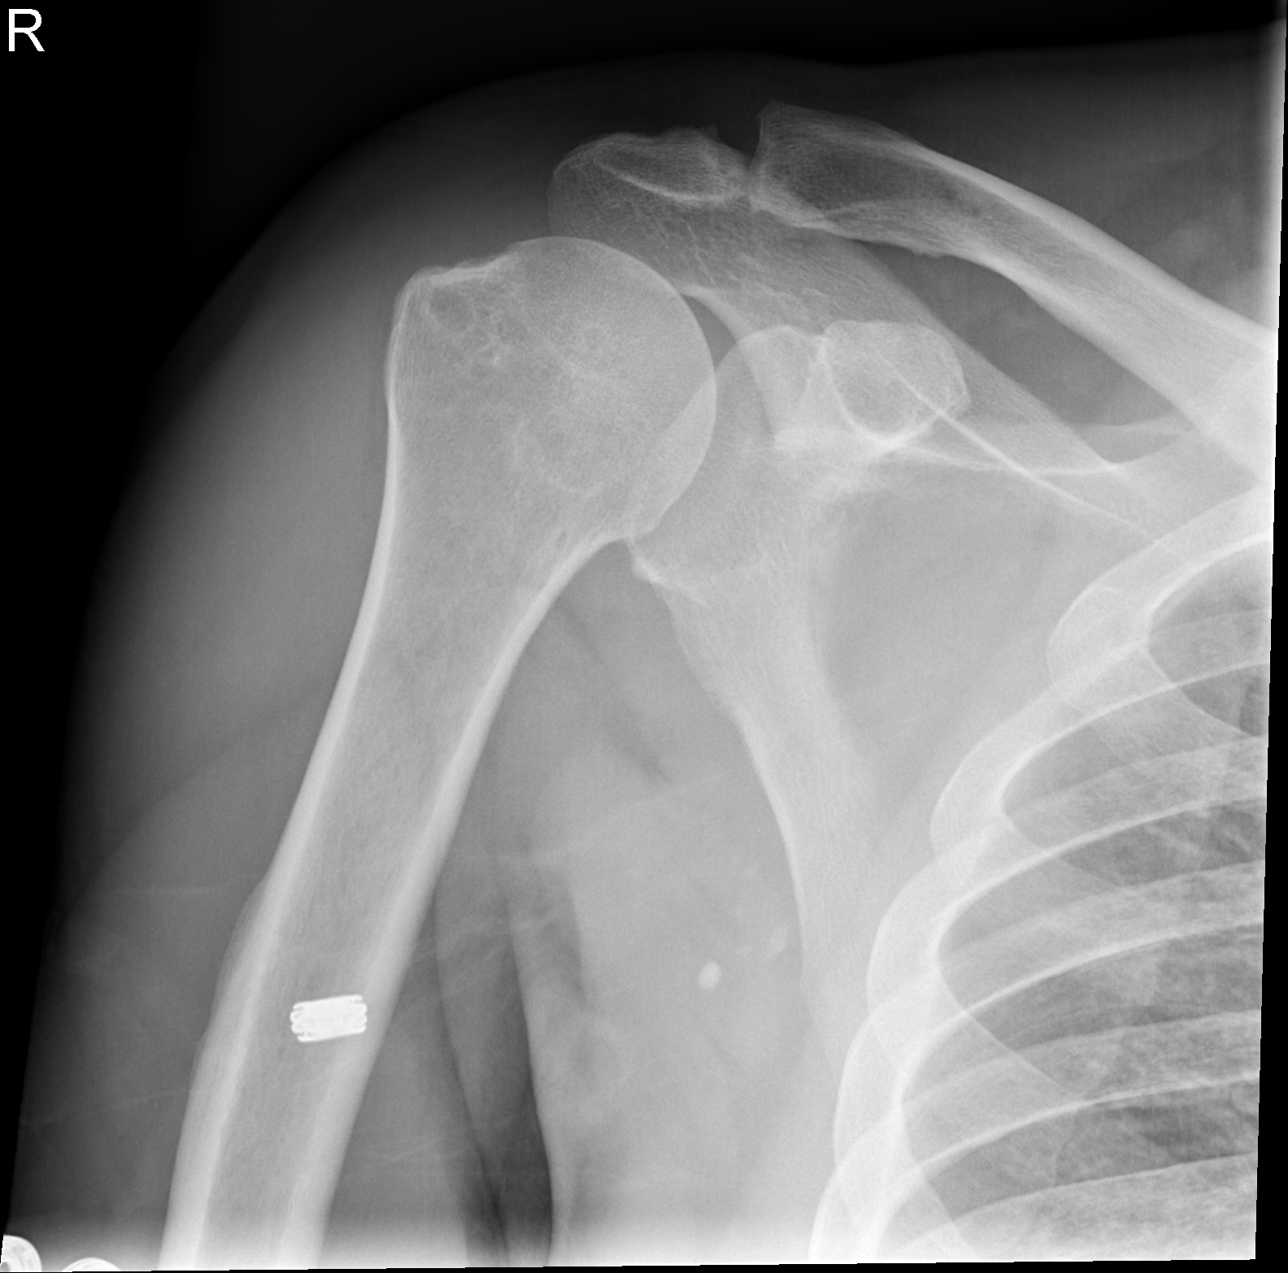

[t shoulder ap external righ]
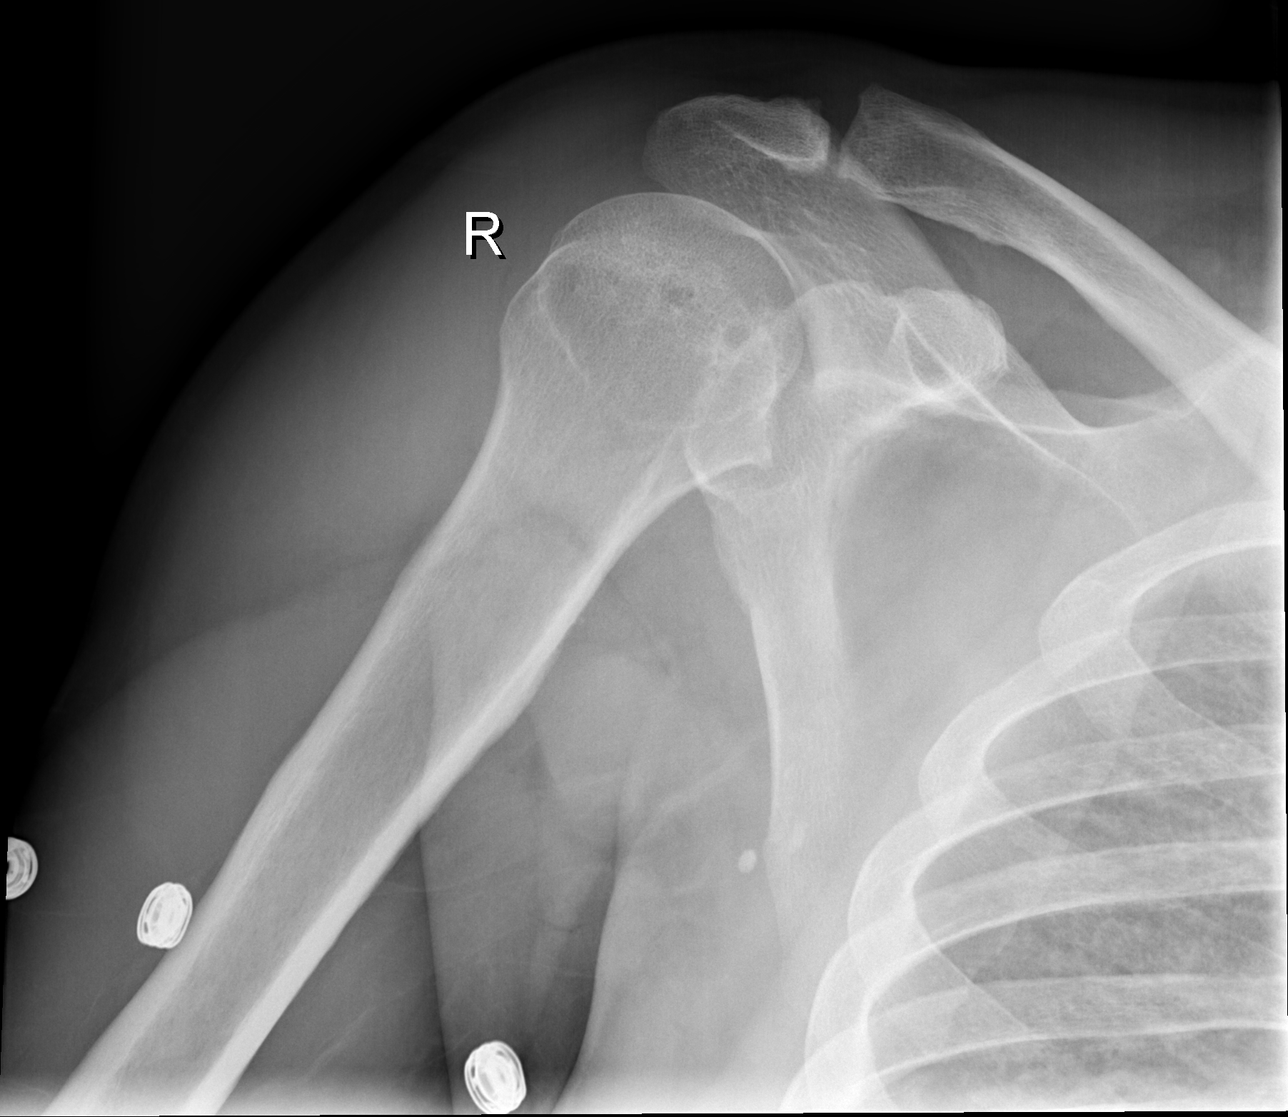

[t shoulder y view right]
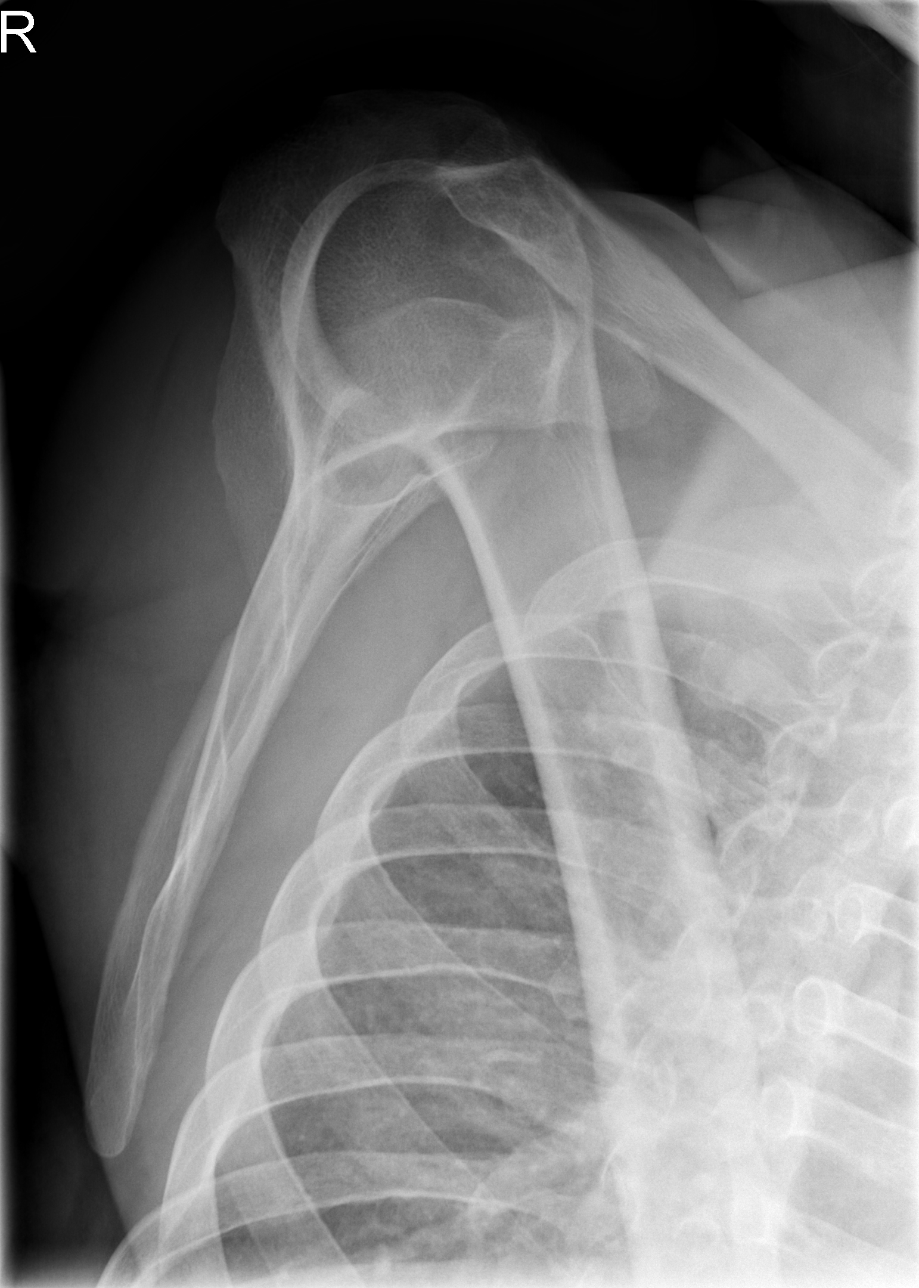

[3 of 3 positions shown; findings below may reference images not displayed]

FINDINGS: Degenerative changes noted in the right shoulder, stable
since prior study.  Also stable R probable calcified right axillary
lymph nodes. No acute bony abnormality.  Specifically, no fracture,
subluxation, or dislocation.  Soft tissues are intact.
IMPRESSION: No acute bony abnormality.

## 2009-03-29 IMAGING — CR DG CHEST 2V
2 series · 2 of 2 positions shown · non-contrast
Comparison: [DATE]

CLINICAL DATA: Chest pain.

CHEST - 2 VIEW

[w chest lat]
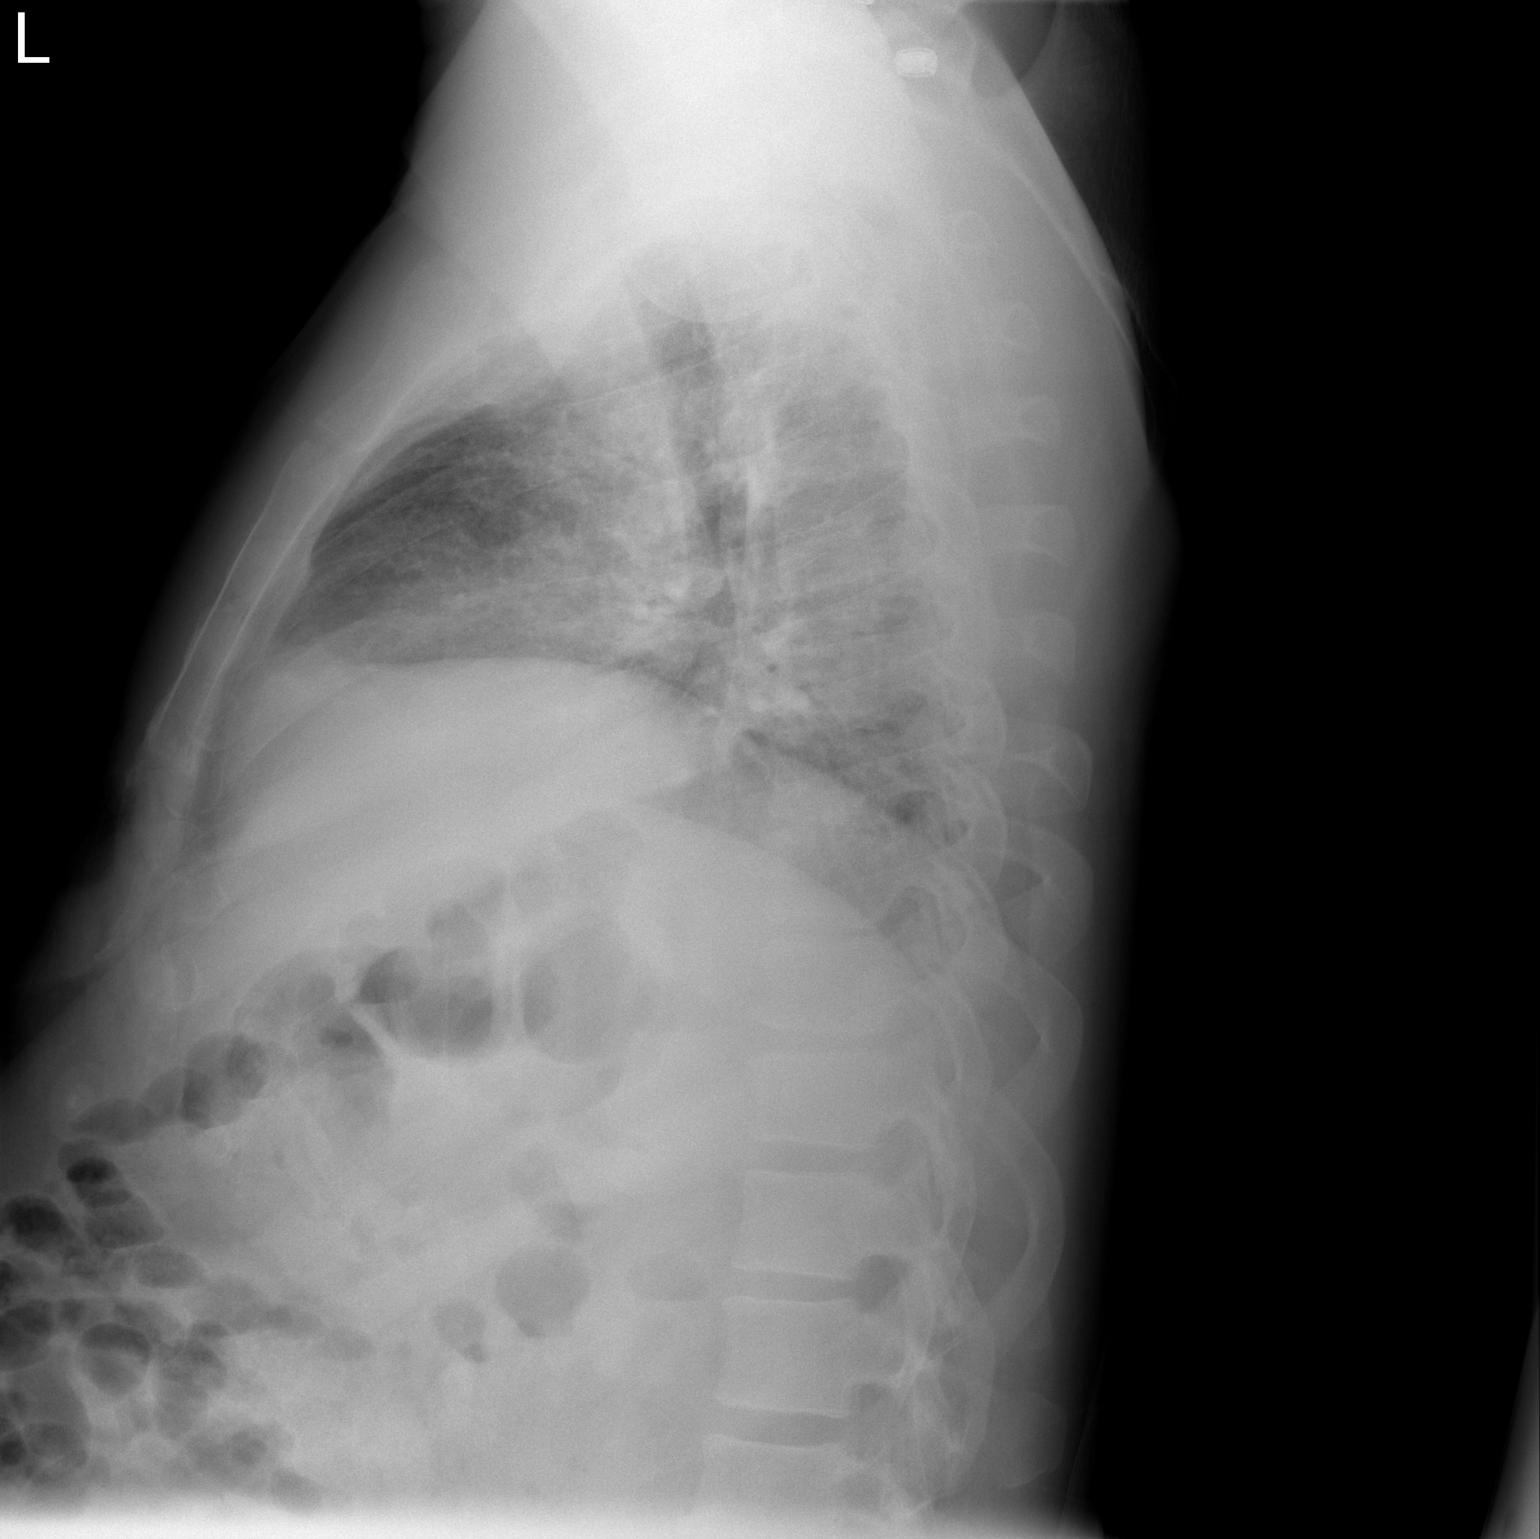

[view not recorded]
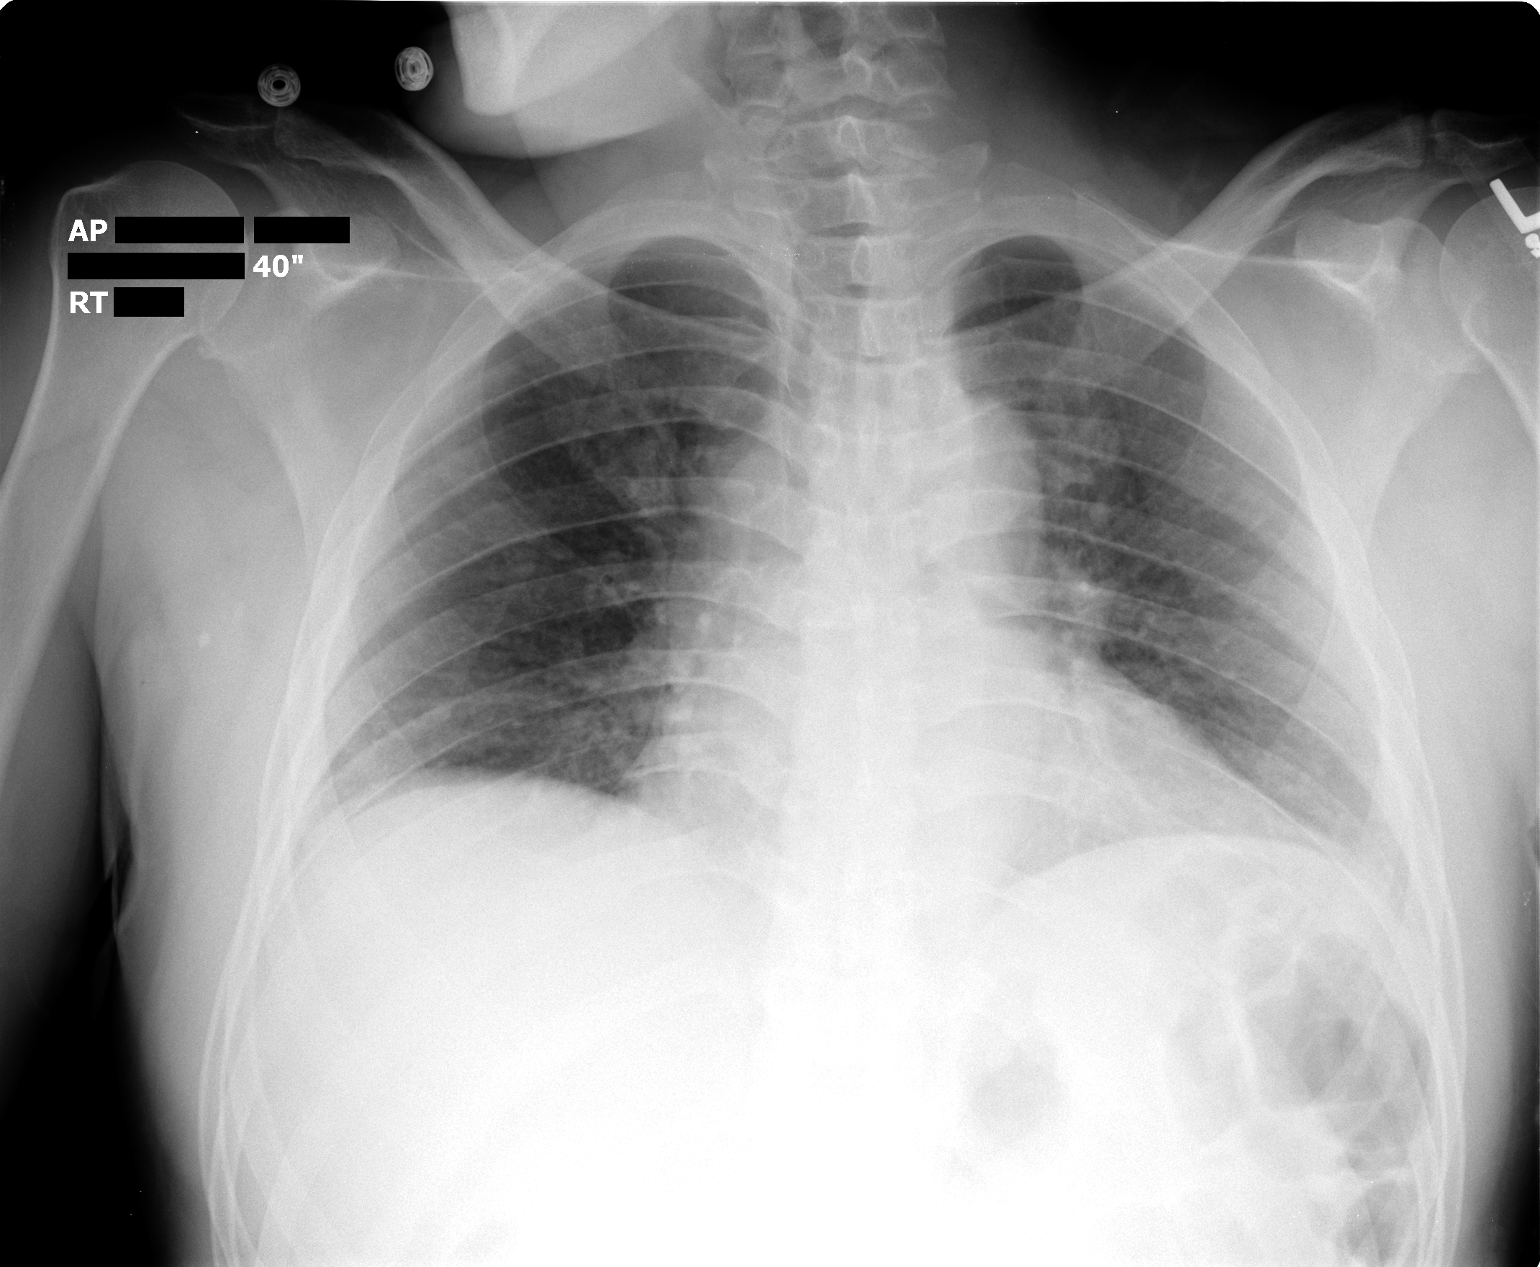

[2 of 2 positions shown; findings below may reference images not displayed]

FINDINGS: There are low lung volumes with bibasilar atelectasis and
mild cardiomegaly.  Again seen are calcifications in the right
axilla, likely calcified nodes, unchanged.  No effusions.  No acute
bony abnormality.
IMPRESSION: Low lung volumes, bibasilar atelectasis, cardiomegaly.

## 2009-04-27 ENCOUNTER — Ambulatory Visit: Payer: Self-pay | Admitting: Cardiology

## 2009-04-27 DIAGNOSIS — R079 Chest pain, unspecified: Secondary | ICD-10-CM

## 2009-04-27 DIAGNOSIS — I10 Essential (primary) hypertension: Secondary | ICD-10-CM | POA: Insufficient documentation

## 2009-04-27 DIAGNOSIS — F17200 Nicotine dependence, unspecified, uncomplicated: Secondary | ICD-10-CM

## 2009-04-27 DIAGNOSIS — I209 Angina pectoris, unspecified: Secondary | ICD-10-CM | POA: Insufficient documentation

## 2009-06-29 ENCOUNTER — Emergency Department (HOSPITAL_COMMUNITY): Admission: EM | Admit: 2009-06-29 | Discharge: 2009-06-30 | Payer: Self-pay | Admitting: Emergency Medicine

## 2009-06-29 IMAGING — CR DG CHEST 2V
2 series · 2 of 2 positions shown · non-contrast
Comparison: [DATE]

CLINICAL DATA: Chest pain, hypertension, smoker

CHEST - 2 VIEW

[w chest pa]
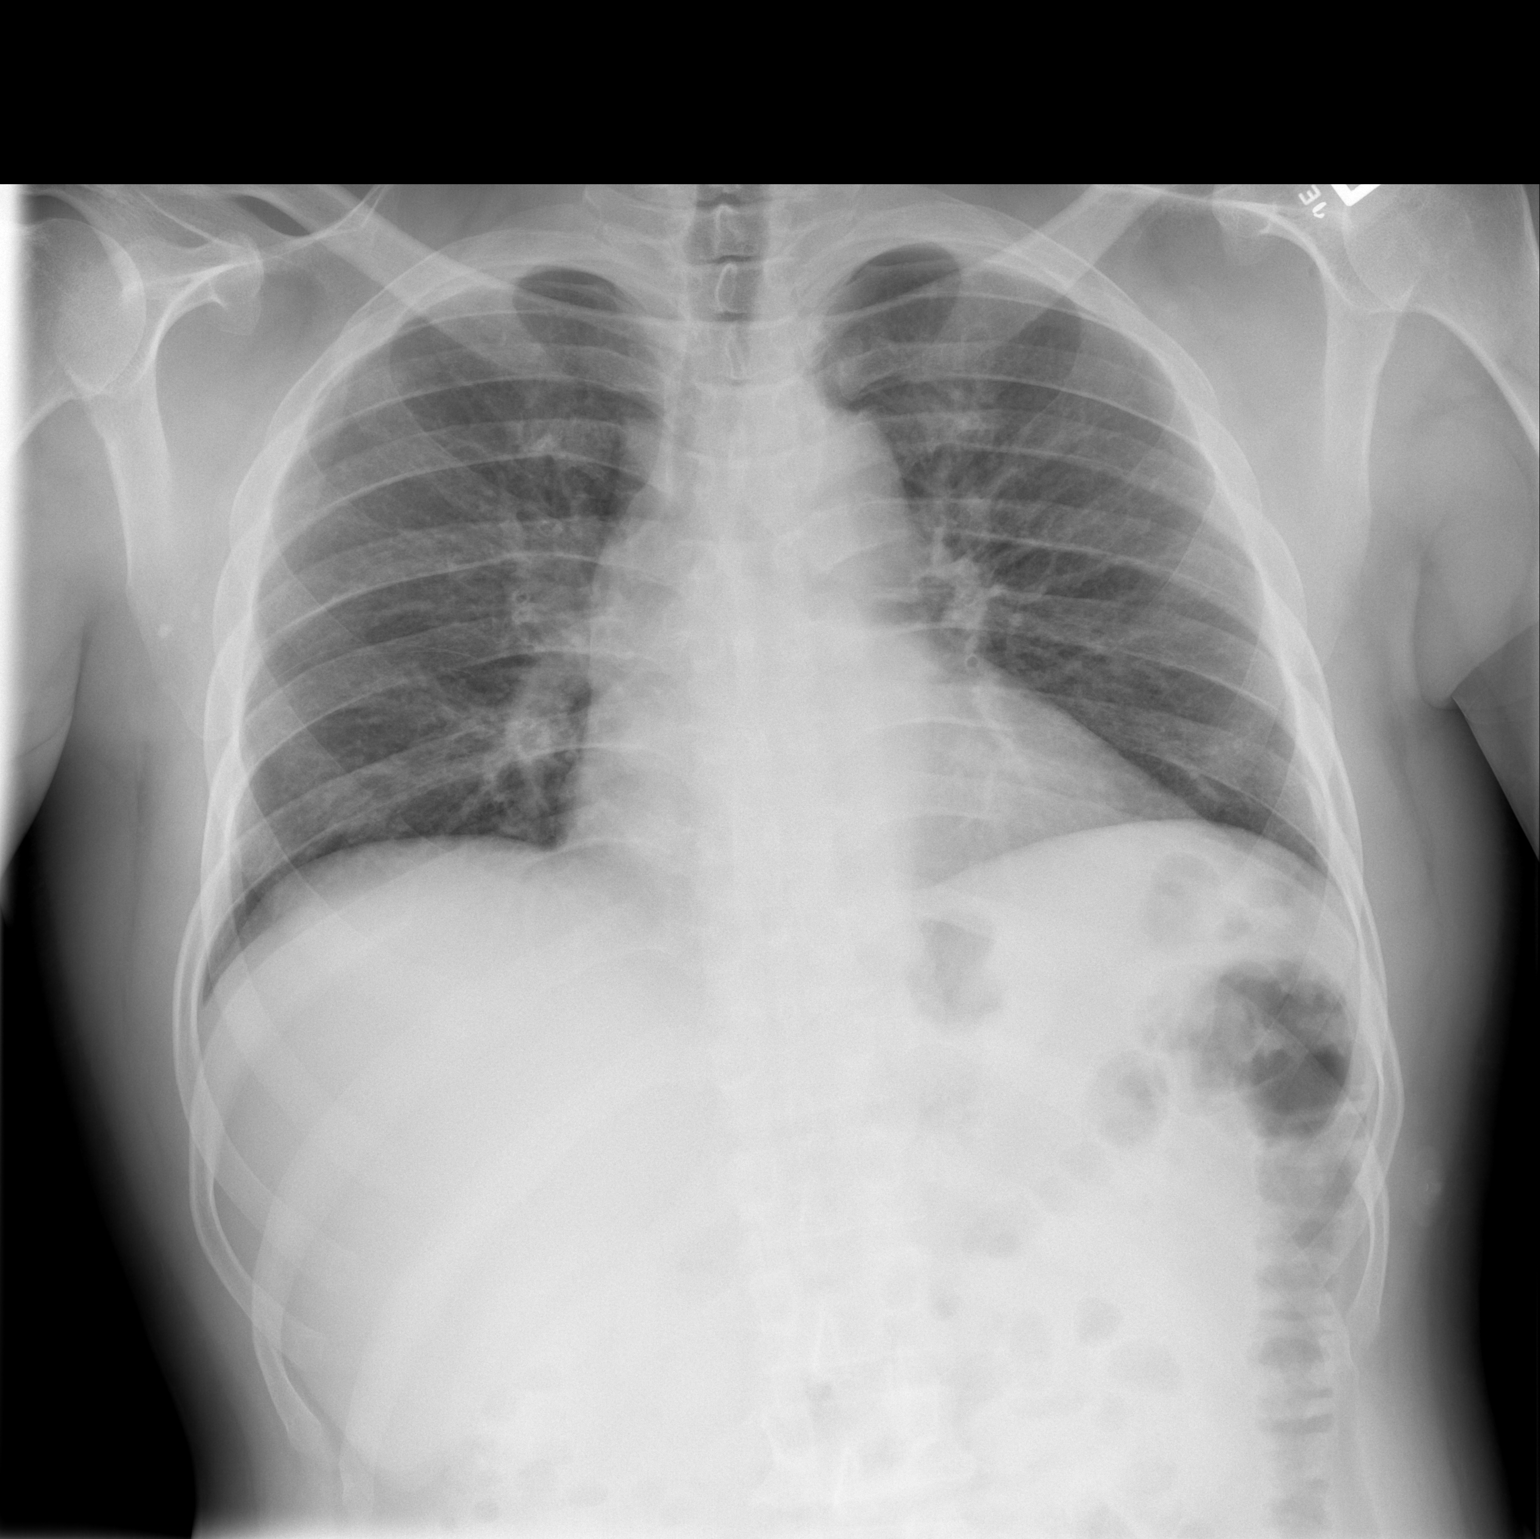

[w chest lat]
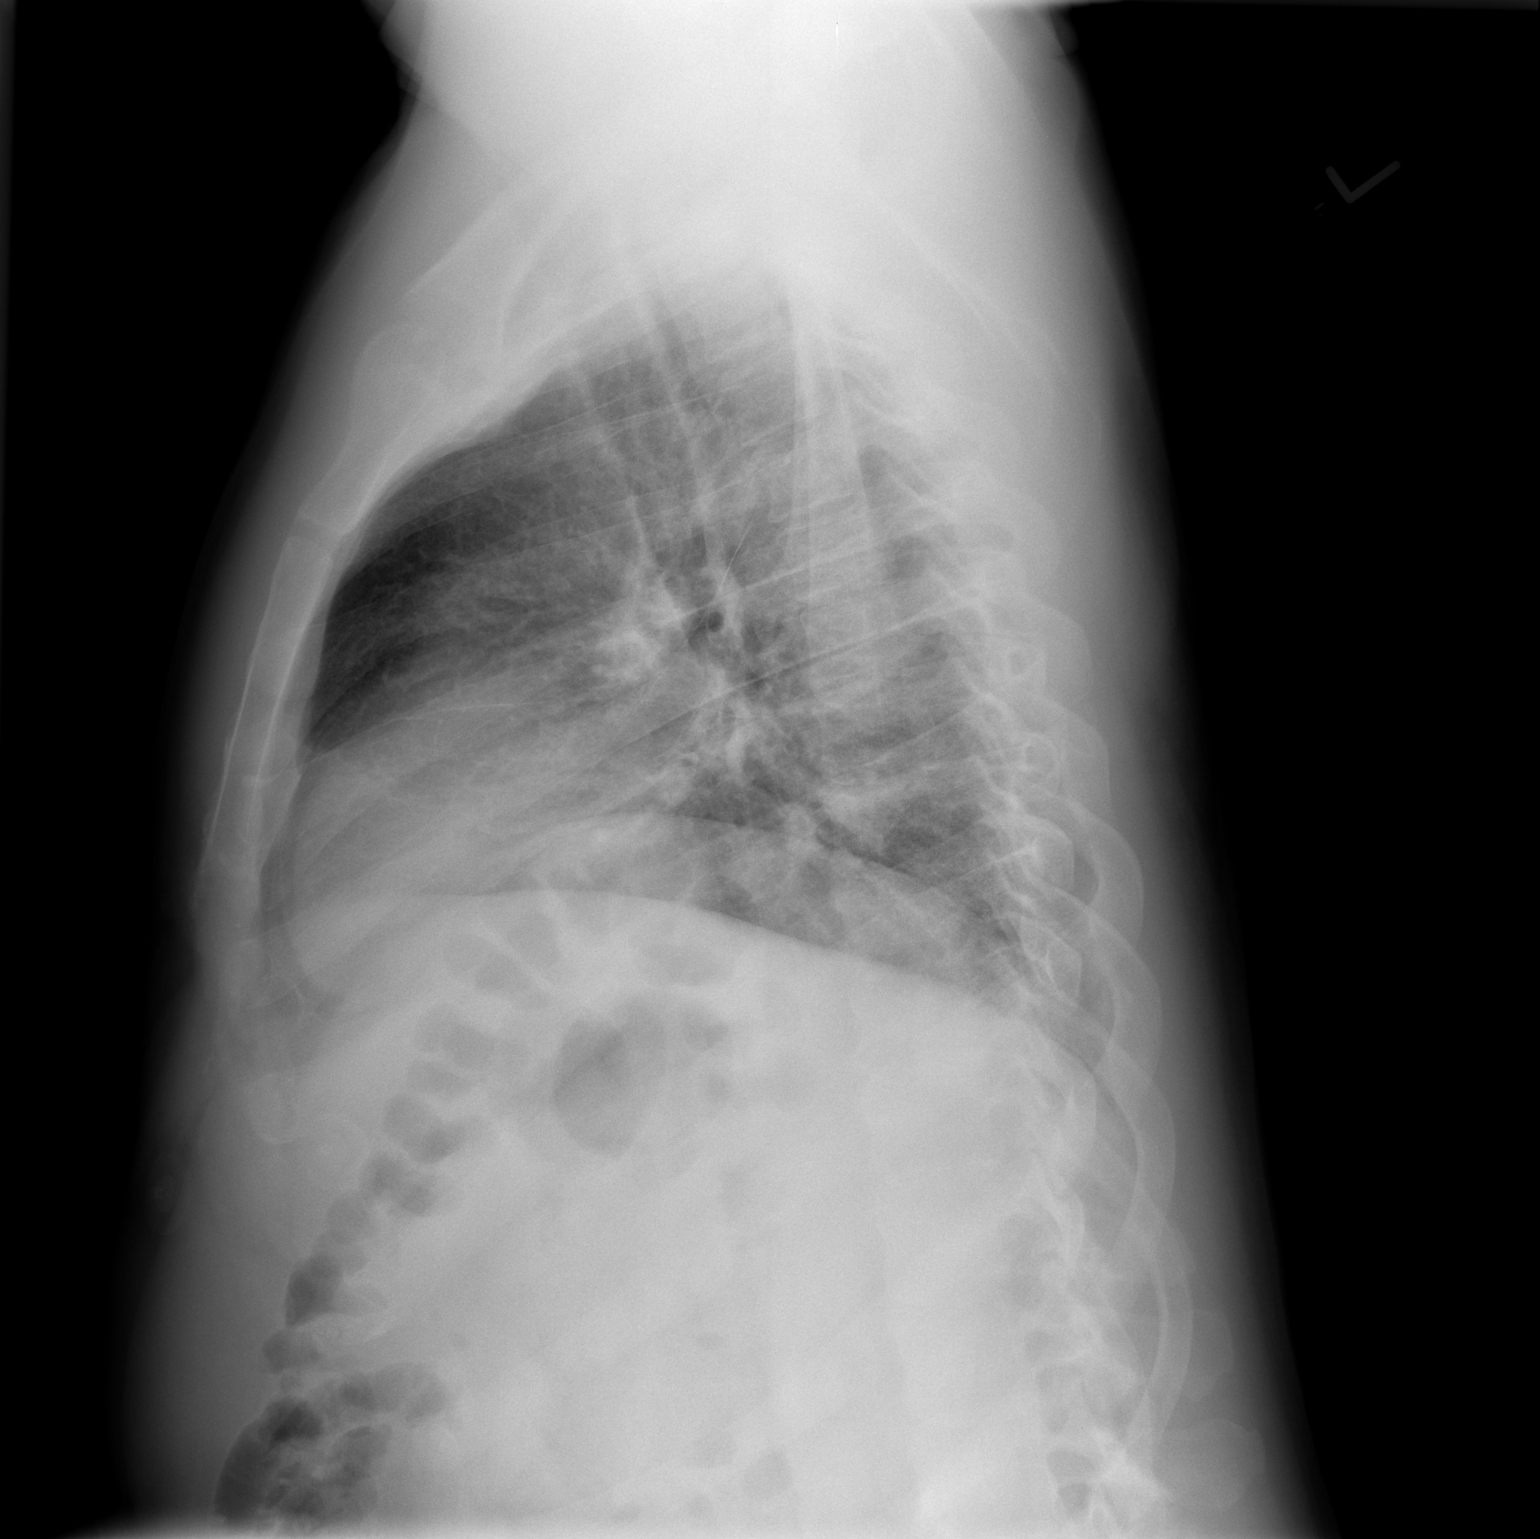

[2 of 2 positions shown; findings below may reference images not displayed]

FINDINGS: Low lung volumes with minimal basilar atelectasis.
Accentuated heart size.
Mediastinal contours normal.
Moderate bronchitic changes without infiltrate or effusion.
No pneumothorax.
Bones unremarkable.
IMPRESSION: Decreased lung volumes with moderate bronchitic changes and minimal
basilar atelectasis.

## 2009-07-06 ENCOUNTER — Inpatient Hospital Stay (HOSPITAL_COMMUNITY): Admission: EM | Admit: 2009-07-06 | Discharge: 2009-07-09 | Payer: Self-pay | Admitting: Emergency Medicine

## 2009-07-06 IMAGING — CT CT ANGIO CHEST
1 of 2 series · 19 of 32 positions shown · IV contrast (agent unspecified)
Comparison: [DATE] and chest radiograph on [DATE]

CLINICAL DATA: 48-year-old with pain and elevated D-dimer.
Shortness of breath.

CT ANGIOGRAPHY CHEST WITH CONTRAST
TECHNIQUE: Multidetector CT imaging of the chest was performed
using the standard protocol during bolus administration of
intravenous contrast. Multiplanar CT image reconstructions
including MIPs were obtained to evaluate the vascular anatomy.
Contrast: 80 ml [MZ]

[Series 7: pe thins @ 1mm · axial · 0.70mm/px · z∈[-298,-40]mm · 19 of 284 slices shown]
[im 13/284  lung]
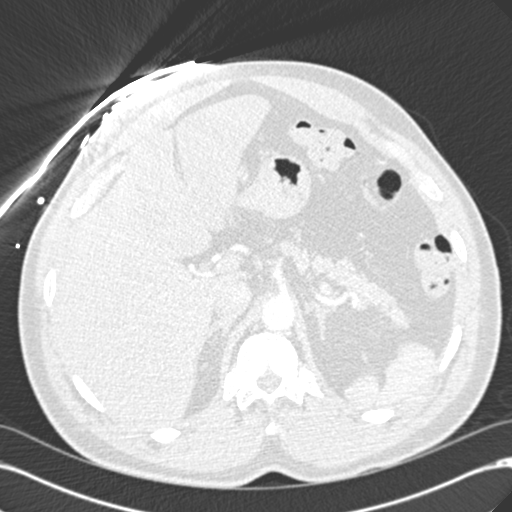
[im 25/284  soft-tissue]
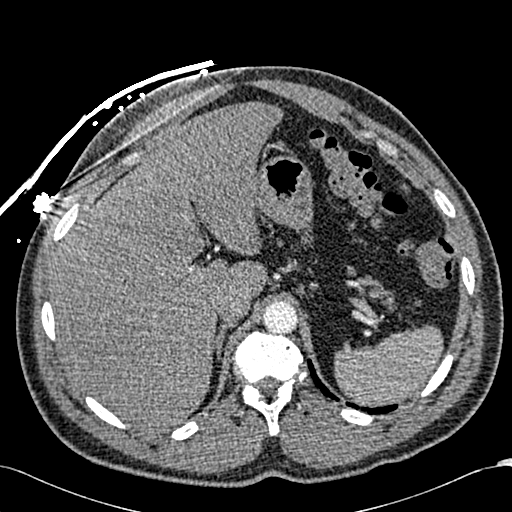
[im 37/284  lung]
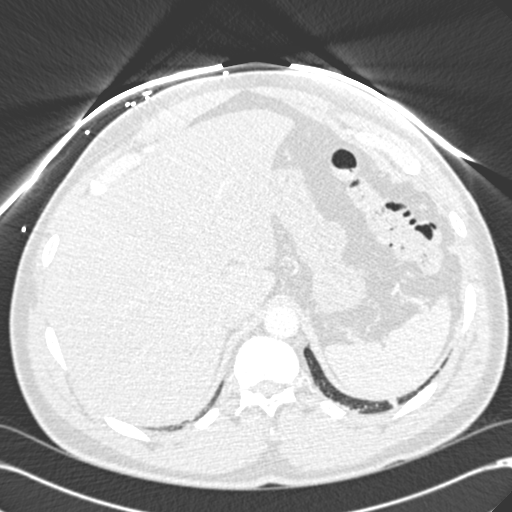
[im 62/284  soft-tissue]
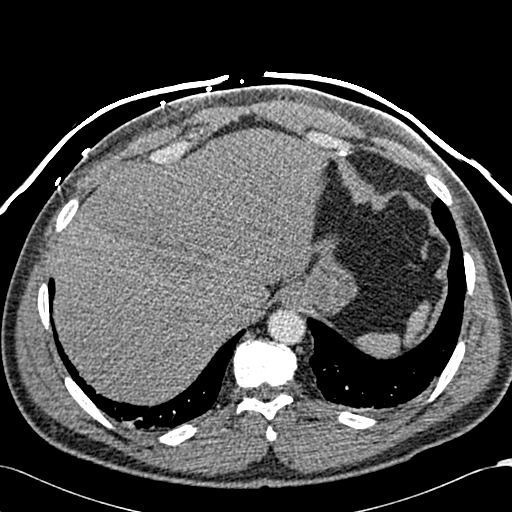
[im 74/284  lung]
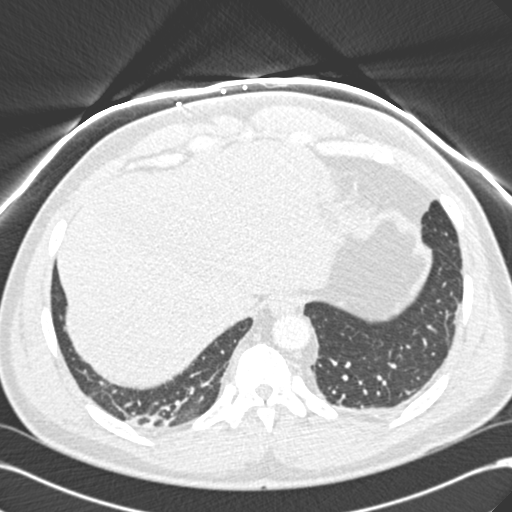
[im 87/284  soft-tissue]
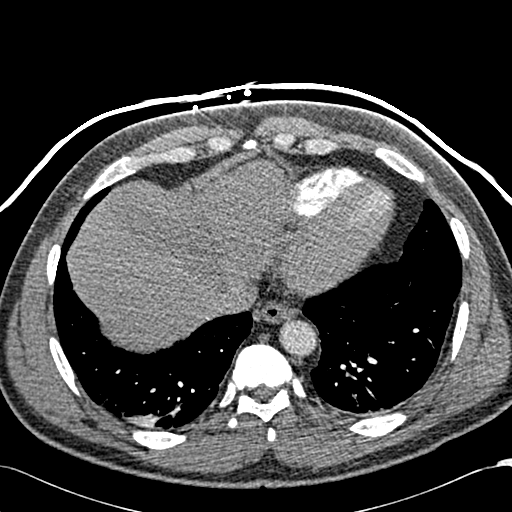
[im 99/284  lung]
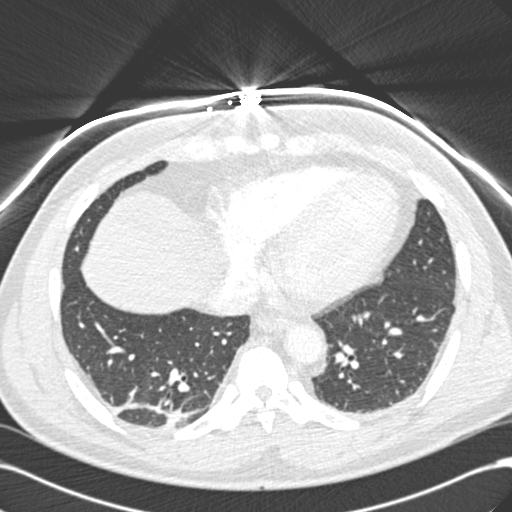
[im 111/284  soft-tissue]
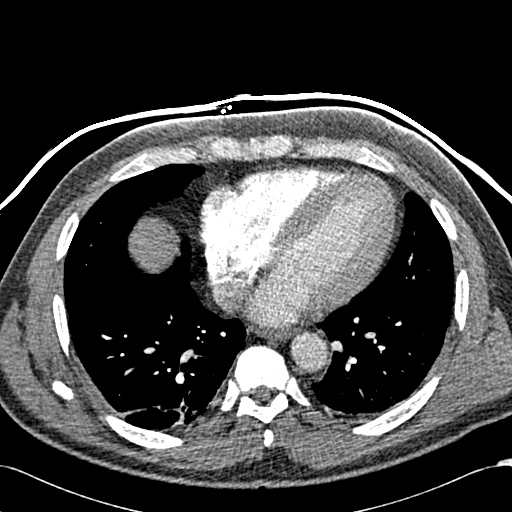
[im 124/284  lung]
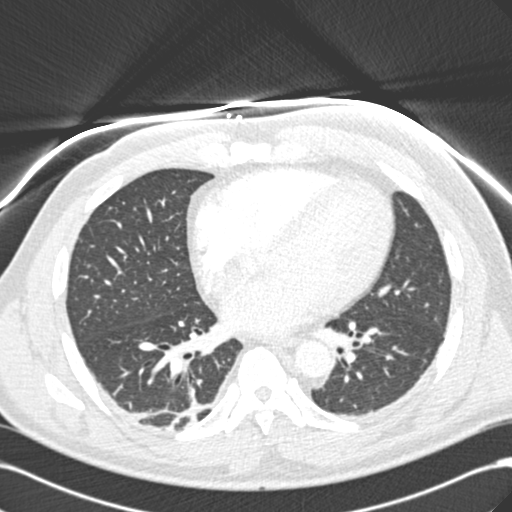
[im 148/284  soft-tissue]
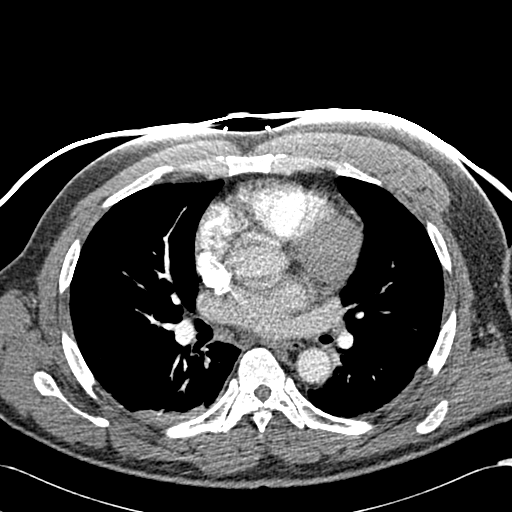
[im 160/284  lung]
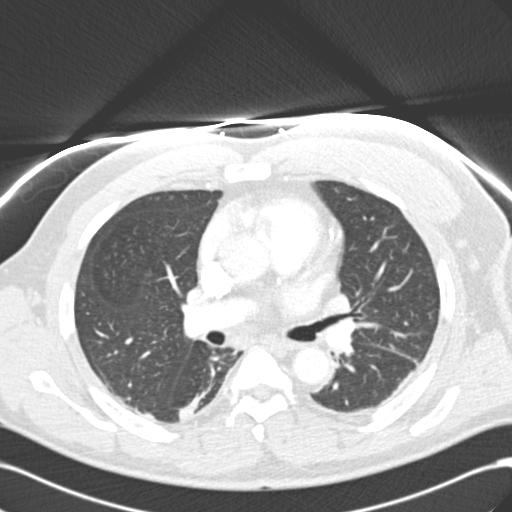
[im 173/284  soft-tissue]
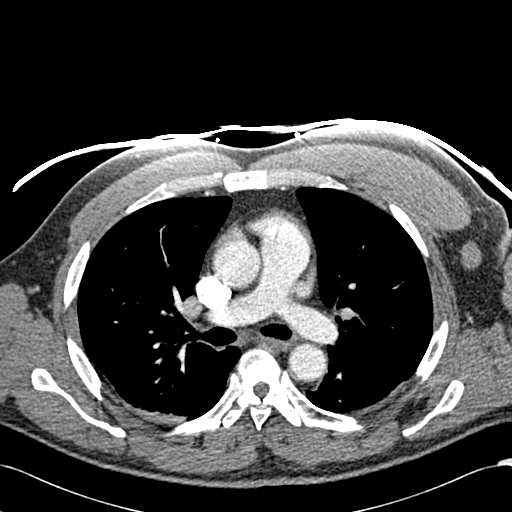
[im 185/284  lung]
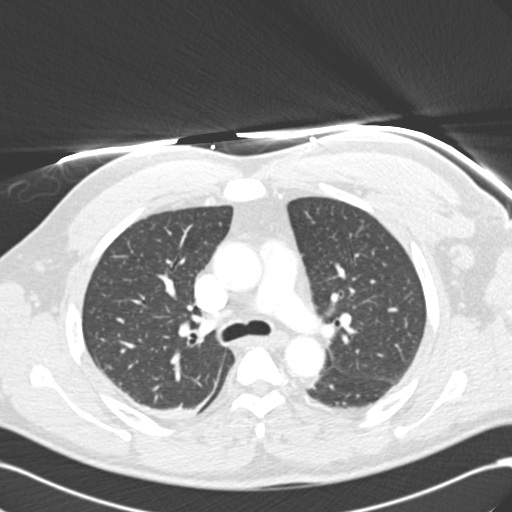
[im 197/284  soft-tissue]
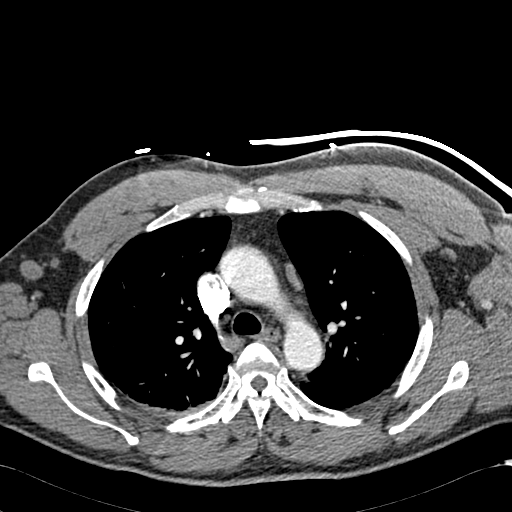
[im 210/284  lung]
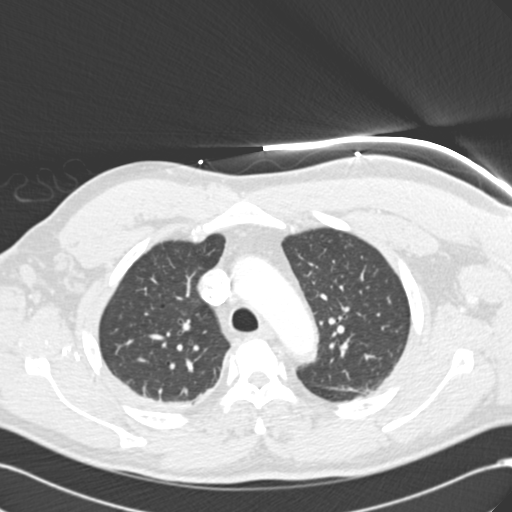
[im 222/284  soft-tissue]
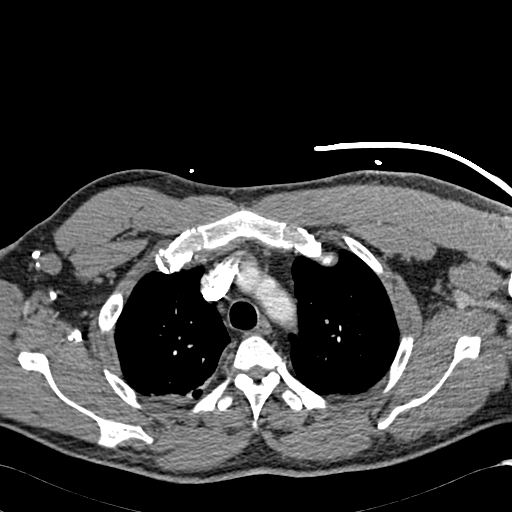
[im 247/284  lung]
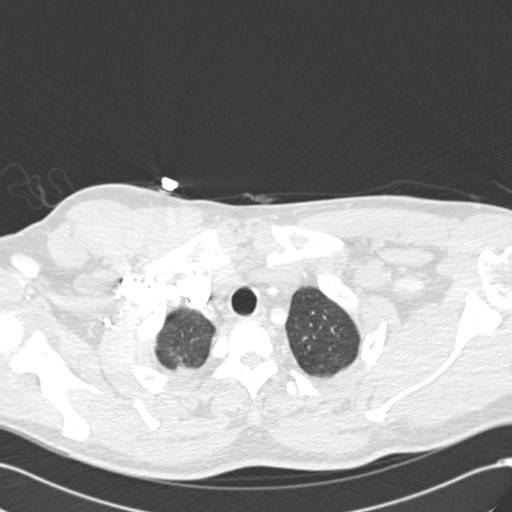
[im 259/284  soft-tissue]
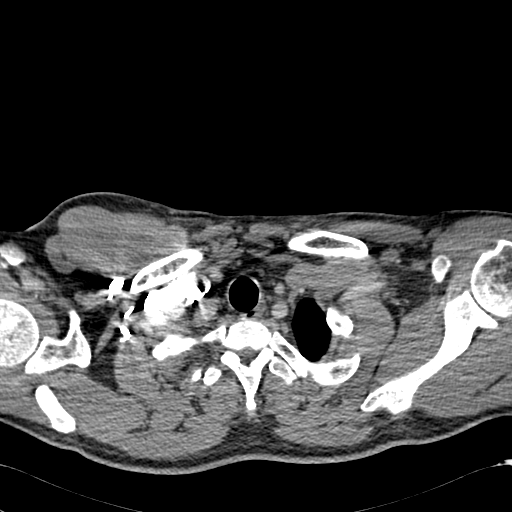
[im 271/284  lung]
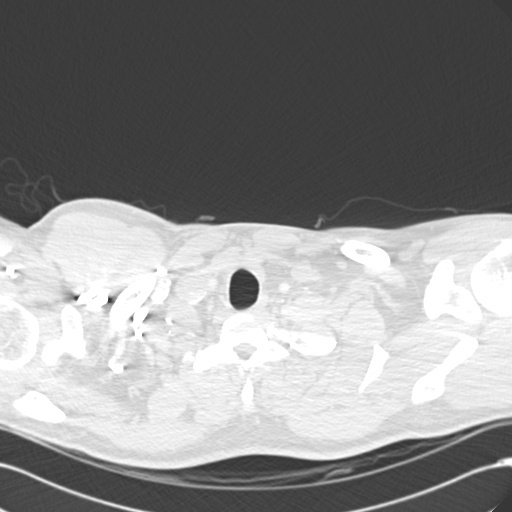

[19 of 32 positions shown; findings below may reference images not displayed]

FINDINGS: No evidence to suggest a pulmonary embolism.  The upper
lobe pulmonary arteries are not well opacified but there are no
large filling defects.  The images of the upper abdomen are
unremarkable.  There is a prominent lymph node in the gastrohepatic
ligament on sequence #6, image 92 that measures 1.2 x 0.9 cm.
Again seen is a small amount of right subcarinal soft tissue that
measures 1.1 cm on image 43, sequence 6 and previously measured
cm.  Again noted are prominent mediastinal lymph nodes which have
not significantly changed.  The patient has a prominent left
axillary lymph node measuring up to 2.2 cm and previously measured
2.8 cm.  Right axillary lymph node measures 2 cm and previously
measured 1.8 cm.  There is another right axillary node on image 29
that measures up to 1.9 cm and previously measured 1.5 cm.  There
is no significant pericardial or pleural fluid.  The trachea and
mainstem bronchi are patent.  The patient has extensive dependent
atelectasis.  There are small punctate densities near the right
minor fissure which have not significantly changed.  There is no
focal airspace disease.  No acute bony abnormalities.

Review of the MIP images confirms the above findings.
IMPRESSION: Negative for pulmonary embolism.

Dependent atelectatic changes.

Punctate nodular densities near the right minor fissure which have
not significantly changed.

Slight enlargement of the right axillary lymph nodes.  No
significant change in the mediastinal lymph nodes.

## 2009-07-06 IMAGING — CR DG CHEST 2V
2 series · 2 of 2 positions shown · non-contrast
Comparison: Chest x-ray of [DATE]

CLINICAL DATA: Diffuse pain, short of breath, cough

CHEST - 2 VIEW

[w chest lat]
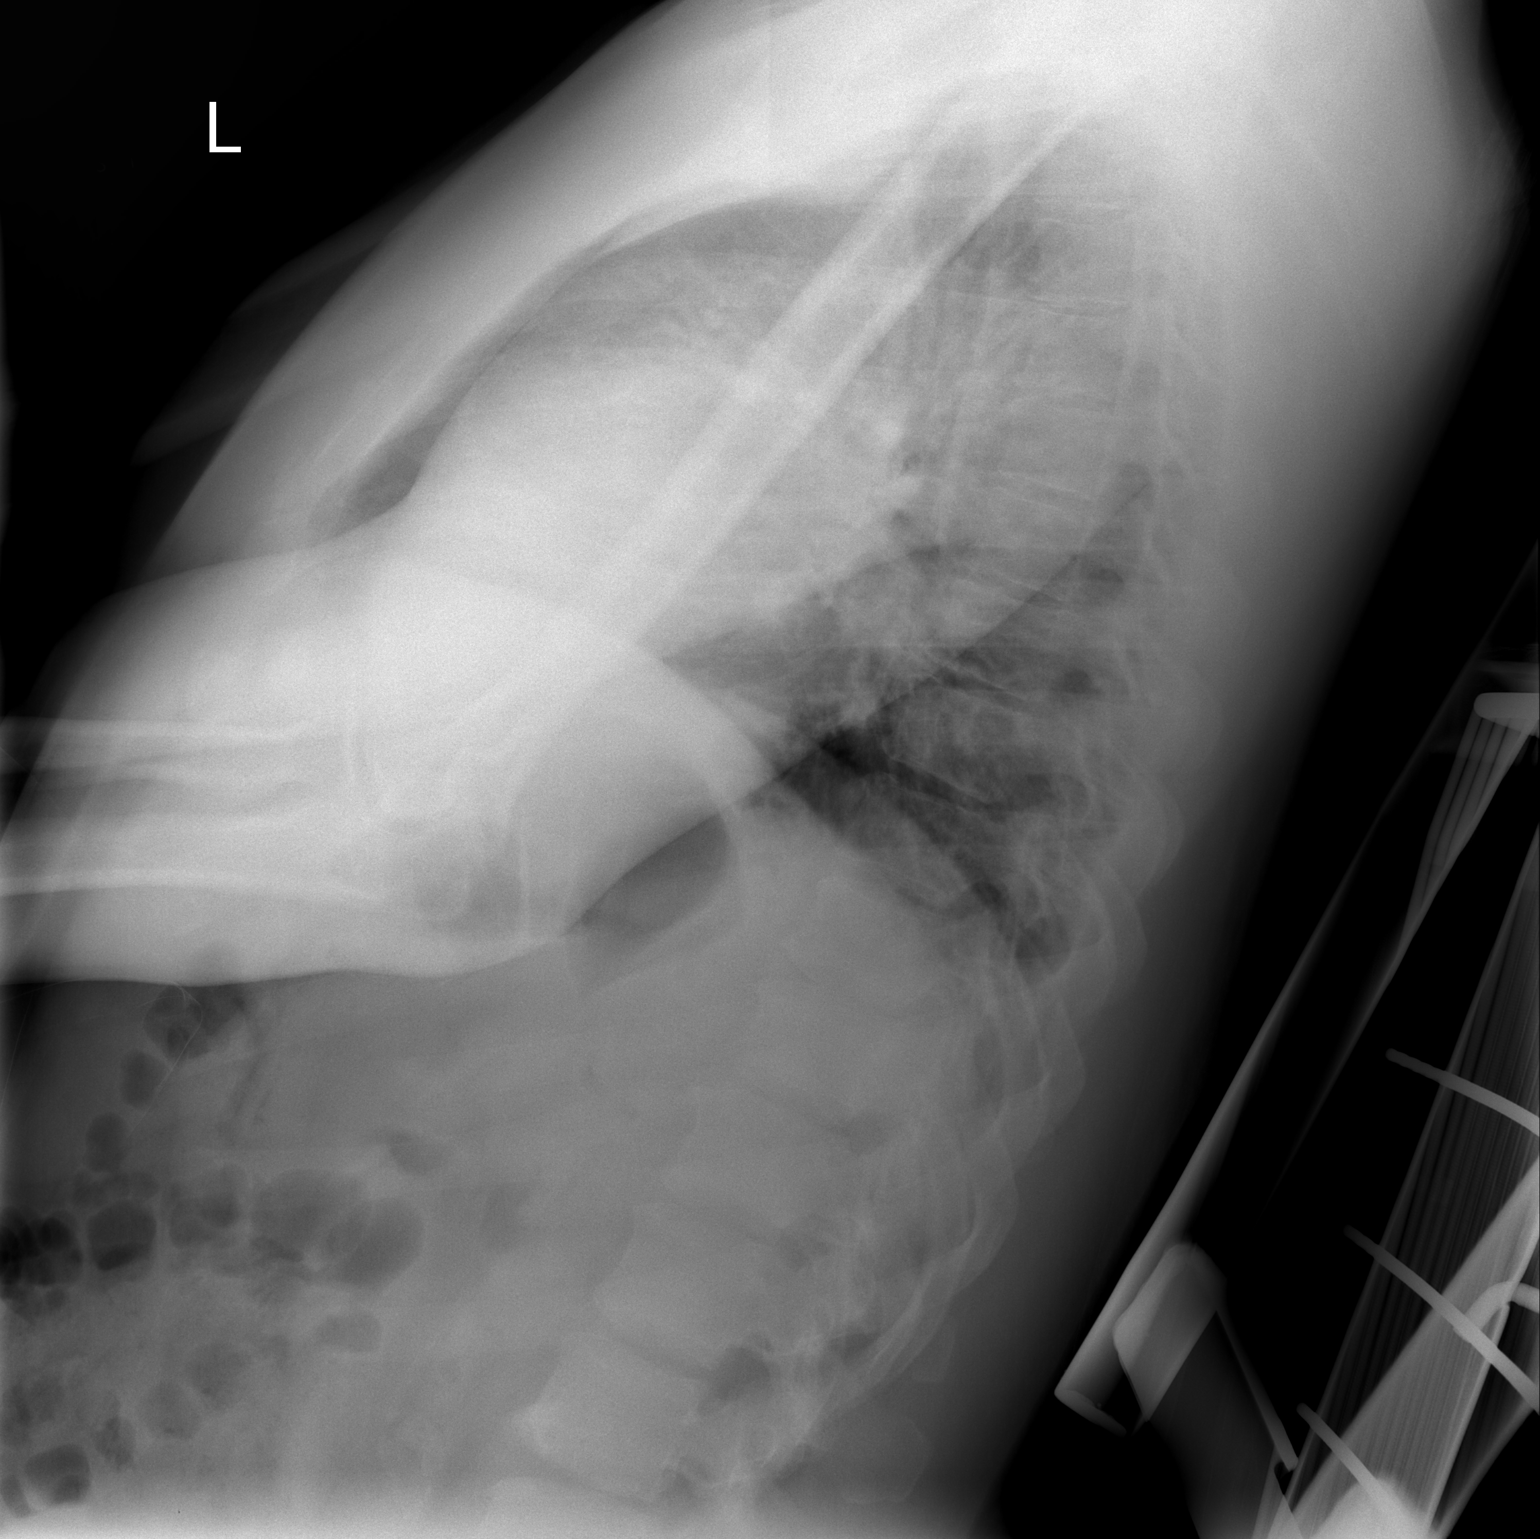

[view not recorded]
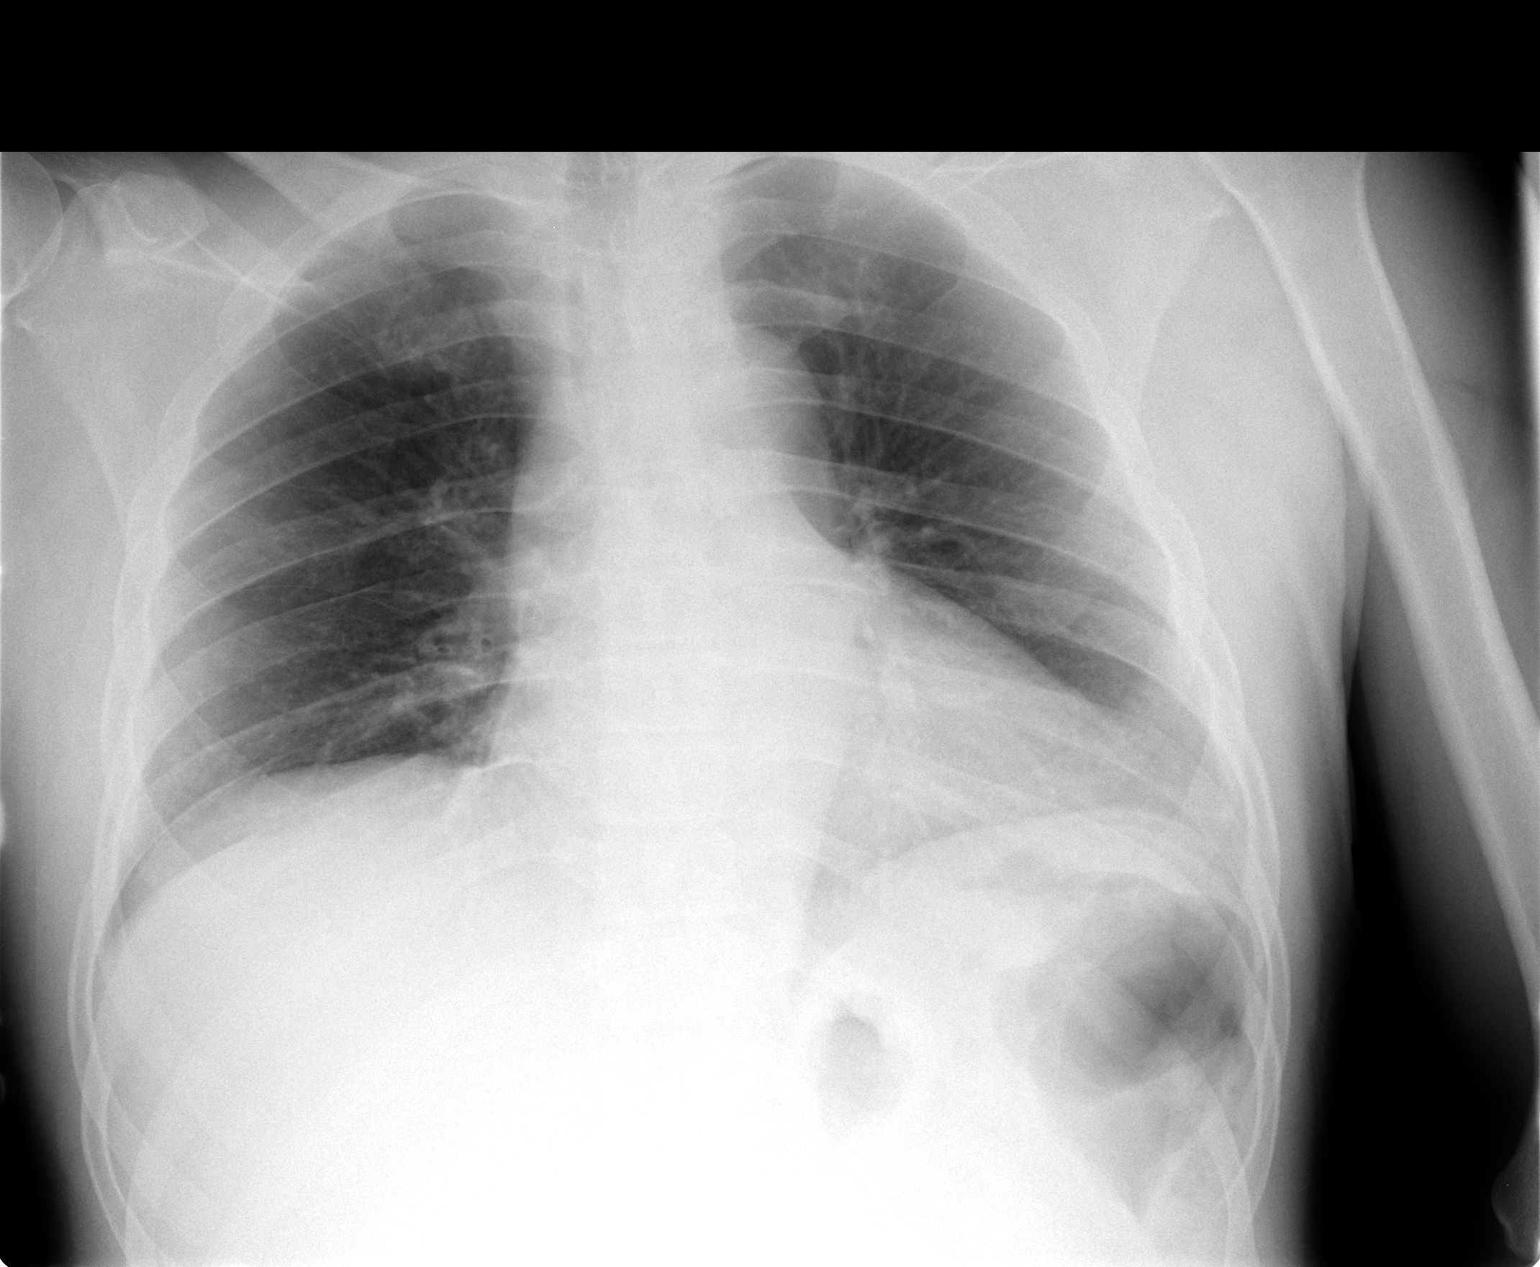

[2 of 2 positions shown; findings below may reference images not displayed]

FINDINGS: The lungs are clear.  Cardiomegaly is stable.  No bony
abnormality is seen.
IMPRESSION: Stable cardiomegaly.  No active lung disease.

## 2009-07-07 ENCOUNTER — Encounter (INDEPENDENT_AMBULATORY_CARE_PROVIDER_SITE_OTHER): Payer: Self-pay | Admitting: Internal Medicine

## 2009-07-29 ENCOUNTER — Telehealth (INDEPENDENT_AMBULATORY_CARE_PROVIDER_SITE_OTHER): Payer: Self-pay | Admitting: *Deleted

## 2009-09-22 ENCOUNTER — Ambulatory Visit: Payer: Self-pay | Admitting: Internal Medicine

## 2009-09-22 LAB — CONVERTED CEMR LAB
Alkaline Phosphatase: 69 units/L (ref 39–117)
BUN: 11 mg/dL (ref 6–23)
CO2: 21 meq/L (ref 19–32)
Creatinine, Ser: 0.94 mg/dL (ref 0.40–1.50)
Glucose, Bld: 175 mg/dL — ABNORMAL HIGH (ref 70–99)
Hgb A1c MFr Bld: 6.5 % — ABNORMAL HIGH (ref 4.6–6.1)
Total Bilirubin: 0.8 mg/dL (ref 0.3–1.2)

## 2009-10-19 ENCOUNTER — Ambulatory Visit: Payer: Self-pay | Admitting: Internal Medicine

## 2011-03-11 LAB — CBC
HCT: 33.9 % — ABNORMAL LOW (ref 39.0–52.0)
Hemoglobin: 11.4 g/dL — ABNORMAL LOW (ref 13.0–17.0)
Hemoglobin: 14.1 g/dL (ref 13.0–17.0)
MCHC: 34 g/dL (ref 30.0–36.0)
MCHC: 34 g/dL (ref 30.0–36.0)
Platelets: 305 10*3/uL (ref 150–400)
Platelets: 317 10*3/uL (ref 150–400)
Platelets: 283 10*3/uL (ref 150–400)
RDW: 15.6 % — ABNORMAL HIGH (ref 11.5–15.5)
RDW: 15.9 % — ABNORMAL HIGH (ref 11.5–15.5)
WBC: 6.3 10*3/uL (ref 4.0–10.5)

## 2011-03-11 LAB — POCT CARDIAC MARKERS
CKMB, poc: 1 ng/mL (ref 1.0–8.0)
Troponin i, poc: <0.05 ng/mL (ref 0.00–0.09)
Troponin i, poc: <0.05 ng/mL (ref 0.00–0.09)

## 2011-03-11 LAB — CULTURE, BLOOD (ROUTINE X 2): Culture: NO GROWTH

## 2011-03-11 LAB — CARDIAC PANEL(CRET KIN+CKTOT+MB+TROPI)
CK, MB: 0.7 ng/mL (ref 0.3–4.0)
CK, MB: 0.9 ng/mL (ref 0.3–4.0)
CK, MB: 1.2 ng/mL (ref 0.3–4.0)
Relative Index: 1.1 (ref 0.0–2.5)
Relative Index: INVALID (ref 0.0–2.5)
Total CK: 76 U/L (ref 7–232)
Total CK: 94 U/L (ref 7–232)
Total CK: 98 U/L (ref 7–232)
Troponin I: 0.02 ng/mL (ref 0.00–0.06)

## 2011-03-11 LAB — DIFFERENTIAL
Basophils Absolute: 0 10*3/uL (ref 0.0–0.1)
Basophils Absolute: 0 10*3/uL (ref 0.0–0.1)
Basophils Relative: 0 % (ref 0–1)
Eosinophils Relative: 0 % (ref 0–5)
Eosinophils Relative: 2 % (ref 0–5)
Lymphocytes Relative: 24 % (ref 12–46)
Monocytes Absolute: 1.1 10*3/uL — ABNORMAL HIGH (ref 0.1–1.0)
Monocytes Absolute: 0.4 10*3/uL (ref 0.1–1.0)
Monocytes Relative: 6 % (ref 3–12)
Neutro Abs: 9.4 10*3/uL — ABNORMAL HIGH (ref 1.7–7.7)

## 2011-03-11 LAB — BASIC METABOLIC PANEL
BUN: 20 mg/dL (ref 6–23)
CO2: 26 mEq/L (ref 19–32)
Glucose, Bld: 317 mg/dL — ABNORMAL HIGH (ref 70–99)
Potassium: 3.7 mEq/L (ref 3.5–5.1)
Sodium: 136 mEq/L (ref 135–145)

## 2011-03-11 LAB — HEPATIC FUNCTION PANEL
ALT: 20 U/L (ref 0–53)
AST: 36 U/L (ref 0–37)
Total Protein: 8 g/dL (ref 6.0–8.3)

## 2011-03-11 LAB — COMPREHENSIVE METABOLIC PANEL
AST: 28 U/L (ref 0–37)
Albumin: 3.7 g/dL (ref 3.5–5.2)
Alkaline Phosphatase: 71 U/L (ref 39–117)
Chloride: 107 mEq/L (ref 96–112)
Creatinine, Ser: 1.1 mg/dL (ref 0.4–1.5)
GFR calc Af Amer: >60 mL/min (ref >60)
Potassium: 3.3 mEq/L — ABNORMAL LOW (ref 3.5–5.1)
Total Bilirubin: 1 mg/dL (ref 0.3–1.2)
Total Protein: 7.5 g/dL (ref 6.0–8.3)

## 2011-03-11 LAB — URINALYSIS, ROUTINE W REFLEX MICROSCOPIC
Bilirubin Urine: NEGATIVE
Ketones, ur: NEGATIVE mg/dL
Leukocytes, UA: NEGATIVE
Nitrite: POSITIVE — AB
Nitrite: NEGATIVE
Protein, ur: 100 mg/dL — AB
Specific Gravity, Urine: 1.026 (ref 1.005–1.030)
Urobilinogen, UA: 0.2 mg/dL (ref 0.0–1.0)
pH: 5 (ref 5.0–8.0)

## 2011-03-11 LAB — POCT I-STAT, CHEM 8
BUN: 11 mg/dL (ref 6–23)
Calcium, Ion: 1.13 mmol/L (ref 1.12–1.32)
Chloride: 96 mEq/L (ref 96–112)
Glucose, Bld: 137 mg/dL — ABNORMAL HIGH (ref 70–99)

## 2011-03-11 LAB — ANA: Anti Nuclear Antibody(ANA): NEGATIVE

## 2011-03-11 LAB — URINE MICROSCOPIC-ADD ON

## 2011-03-11 LAB — D-DIMER, QUANTITATIVE

## 2011-03-11 LAB — GLUCOSE, CAPILLARY: Glucose-Capillary: 132 mg/dL — ABNORMAL HIGH (ref 70–99)

## 2011-03-11 LAB — HEPATITIS PANEL, ACUTE
Hep B C IgM: NEGATIVE
Hepatitis B Surface Ag: NEGATIVE

## 2011-03-11 LAB — RAPID URINE DRUG SCREEN, HOSP PERFORMED
Amphetamines: NOT DETECTED
Barbiturates: NOT DETECTED
Benzodiazepines: NOT DETECTED
Cocaine: NOT DETECTED
Opiates: NOT DETECTED

## 2011-03-11 LAB — HIV ANTIBODY (ROUTINE TESTING W REFLEX): HIV: NONREACTIVE

## 2011-03-11 LAB — ETHANOL: Alcohol, Ethyl (B): 158 mg/dL — ABNORMAL HIGH (ref 0–10)

## 2011-03-11 LAB — C-PEPTIDE: C-Peptide: 3.56 ng/mL (ref 0.80–3.90)

## 2011-03-11 LAB — RPR: RPR Ser Ql: NONREACTIVE

## 2011-03-11 LAB — GC/CHLAMYDIA PROBE AMP, GENITAL

## 2011-03-11 LAB — HEPATITIS B SURFACE ANTIBODY,QUALITATIVE: Hep B S Ab: NEGATIVE

## 2011-03-11 LAB — HEPATITIS C ANTIBODY: HCV Ab: NEGATIVE

## 2011-03-11 LAB — RHEUMATOID FACTOR: Rhuematoid fact SerPl-aCnc: 23 IU/mL — ABNORMAL HIGH (ref 0–20)

## 2011-03-14 LAB — POCT I-STAT, CHEM 8
Creatinine, Ser: 1.3 mg/dL (ref 0.4–1.5)
Glucose, Bld: 136 mg/dL — ABNORMAL HIGH (ref 70–99)
HCT: 45 % (ref 39.0–52.0)
Hemoglobin: 15.3 g/dL (ref 13.0–17.0)
Potassium: 3.5 mEq/L (ref 3.5–5.1)
Sodium: 139 mEq/L (ref 135–145)
TCO2: 22 mmol/L (ref 0–100)

## 2011-03-14 LAB — BASIC METABOLIC PANEL
BUN: 13 mg/dL (ref 6–23)
Calcium: 9.2 mg/dL (ref 8.4–10.5)
Creatinine, Ser: 1.03 mg/dL (ref 0.4–1.5)
GFR calc non Af Amer: >60 mL/min (ref >60)
Potassium: 3.5 mEq/L (ref 3.5–5.1)

## 2011-03-14 LAB — DIFFERENTIAL
Basophils Absolute: 0 10*3/uL (ref 0.0–0.1)
Basophils Relative: 0 % (ref 0–1)
Lymphocytes Relative: 21 % (ref 12–46)
Neutro Abs: 6.4 10*3/uL (ref 1.7–7.7)
Neutrophils Relative %: 73 % (ref 43–77)

## 2011-03-14 LAB — PROTIME-INR
INR: 1 (ref 0.00–1.49)
Prothrombin Time: 13.3 seconds (ref 11.6–15.2)

## 2011-03-14 LAB — POCT CARDIAC MARKERS
CKMB, poc: 2.7 ng/mL (ref 1.0–8.0)
Myoglobin, poc: 168 ng/mL (ref 12–200)

## 2011-03-14 LAB — RAPID URINE DRUG SCREEN, HOSP PERFORMED
Amphetamines: NOT DETECTED
Barbiturates: NOT DETECTED
Benzodiazepines: NOT DETECTED
Cocaine: NOT DETECTED

## 2011-03-14 LAB — CBC
Hemoglobin: 14.5 g/dL (ref 13.0–17.0)
MCHC: 35 g/dL (ref 30.0–36.0)
RDW: 15.9 % — ABNORMAL HIGH (ref 11.5–15.5)

## 2011-03-14 LAB — URINALYSIS, ROUTINE W REFLEX MICROSCOPIC
Glucose, UA: NEGATIVE mg/dL
Hgb urine dipstick: NEGATIVE
Ketones, ur: NEGATIVE mg/dL
Protein, ur: NEGATIVE mg/dL
pH: 5.5 (ref 5.0–8.0)

## 2011-03-14 LAB — CK TOTAL AND CKMB (NOT AT ARMC)
CK, MB: 3.3 ng/mL (ref 0.3–4.0)
Relative Index: 0.9 (ref 0.0–2.5)
Total CK: 388 U/L — ABNORMAL HIGH (ref 7–232)

## 2011-03-14 LAB — TROPONIN I: Troponin I: 0.01 ng/mL (ref 0.00–0.06)

## 2011-03-14 LAB — ETHANOL: Alcohol, Ethyl (B): 226 mg/dL — ABNORMAL HIGH (ref 0–10)

## 2011-04-17 NOTE — H&P (Signed)
NAME:  Darryl Hurley, Darryl Hurley               ACCOUNT NO.:  [id number]   MEDICAL RECORD NO.:  [id number]          PATIENT TYPE:  INP   LOCATION:  1428                         FACILITY:  [facility]   PHYSICIAN:  Virgie Dad, MD     DATE OF BIRTH:  12/25/1960   DATE OF ADMISSION:  07/06/2009  DATE OF DISCHARGE:                              HISTORY & PHYSICAL   CHIEF COMPLAINT:  Shortness of breath, painful joints, and dysuria.   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  He was brought to the emergency room at the coaxing  of his wife for complaints of generalized swelling and tenderness of all  the joints of the body, especially both elbows, both hands, both knees,  both ankles and feet.  One month ago he was treated for an apparent  urinary tract infection and finished the antibiotic.  He denied any  penile discharge.  He said he had always felt swellen all over and had  some kind of gout, but this time the gout is all over for the past 8  weeks.  He has been increasingly short of breath, needs 3 pillows on  lying down.  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  No chest pain.  Workup in the emergency  room showed blood work, sodium 133, potassium 4, BUN 11, creatinine 1.4,  blood sugar 137, hemoglobin 14.1, hematocrit 41.3, white count 11.2, and  platelet count 305,000.  His cardiac enzymes are negative.  Uric acid  11.9.  Liver function tests are normal except for low albumin of 2.9.  The D-dimer is elevated to 3.95 and sedimentation rate was markedly  elevated to 127 mm per hour.  Urinalysis showed positive nitrite.  No  leukocyte esterase.  Urine showed many bacteria.  Chest x-ray showed  lungs are clear.  Cardiomegaly.  Electrocardiogram showed sinus  tachycardia with LVH and an isolated deep Q-wave on lead III.   PAST MEDICAL  HISTORY:  He has not had any operations.  No  hospitalization for serious illness other than a visit to [facility] 1 month ago for a urinary tract infection.  He has been  healthy.  Also he has gout every now and then for which he takes  allopurinol and colchicine.  The last gout attack was 6 months ago and  it was his big toe on the left.   SOCIAL HISTORY:  He works as a Curator.  He does not smoke, although  the wife says the patient does smoke, consuming a pack of cigarettes  every 3 days.  He drinks beer every day and [employer] gin on the  weekend.   ALLERGIES:  No known drug allergies.   MEDICATIONS:  The medications he remembers are Oxycodone for pain and  nifedipine for blood pressure which he has not taken lately.   FAMILY HISTORY:  Mother died of coma.  Father killed himself.  Baby  brother has asthma and a  sister died from an accident.   REVIEW OF SYSTEMS:  He gets frequent headaches.  Never gets his blood  pressure checked.  Every now and them he buys his blood pressure pill,  but stated he does not have insurance so he does not get to buy his  medications.  Also he had a prescription for hydrochlorothiazide and if  he takes it every day ,he gets gout.  He has no complaints of blurring  of vision, although his wife believes he needs glasses.  No complaints  of post nasal discharge.  Increasing complaints of shortness of breath,  even at rest, and more so on exertion.  He uses 3 pillows for sleep.  He  has shortness of breath and some wheezing even at rest.  Denies any  chest pain or palpitations.  He has complaints of dyspepsia every now  and then for which he takes Mylanta or Tums.  No diarrhea.  No  constipation.  The patient stated if he takes his hydrochlorothiazide he  urinates a lot, but lately he has no complaints in reference to the  genitourinary tract.  Musculoskeletal complaints on and off for the last  2 years for acute gout of the big toe.  He has  generalized stiffness,  but for the last 8 weeks his elbows on both sides, his wrists and  fingers are swollen and tender and so are his knees, his ankles, and his  toes.  Stiffness in the morning lasts more than 1-1/2 hours and when he  starts moving he used to be able to get up.  For the last 2 days the  pain of the extremities, both upper and lower extremities is very severe  to the point that today he cannot walk because of painful knees and  bilateral ankles.   PHYSICAL EXAMINATION:  VITAL SIGNS:  Blood pressure on admission was  140/90, at 1:10 it was 158/90, heart rate 100, respirations 18,  temperature 98.8, oxygen saturation with oxygen at 2 L per minute was  92%.  O2 saturation on room air is 87%.  SKIN:  Warm and dry.  No dermatitis.  HEENT:  Pupils are equal and reacting to light.  No scleral icterus.  NECK:  The neck veins are flat, not distended.  Thyroid gland is normal  in size.  Carotids, normal upstroke.  CHEST:  Bilateral wheezing and sibilant rales.  Clear on coughing.  HEART:  Heart rate 100 per minute.  Regular.  S1, S2 normal.  No S4.  No  gallop.  No murmur heard.  ABDOMEN:  Protuberant.  No ascites.  Negative fluid wave test.  Thick  abdominal adiposity.  Liver cannot be palpated.  Normal bowel sounds.  No guarding.  No tenderness.  EXTREMITIES:  Pedal edema 2+ bilaterally and also has swollen, tender  hands and fingers.  No sign of DVT.  Upper extremities, elbows  bilaterally and the wrists are swollen and tender with increased  temperature.  The digits are bilaterally swollen and tender, both distal  interphalangeal joints and proximal interphalangeal joints.  Examination  of the lower extremities, even the toes, all the phalangeals are swollen  and tender.  Bilateral ankles are swollen and tender.  Bilateral knees  are swollen and tender but no palpable effusion.  NEUROLOGIC:  Cerebellar function intact.  Gait is not tested because the  patient cannot get  out of bed and put weight on his lower extremities  because of pain and tenderness of bilateral  knees and feet.  Spinal and  cranial nerves are normal.  Deep tendon reflexes are normal.  There is  no Babinski.   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  Known gout usually affecting the big toe, on allopurinol and colchicine.  Recent history of urinary tract infection 1 month ago.   1. Hypoxemia, wheezing, orthopnea, with uncontrolled hypertension,      cardiomegaly on x-ray, and left ventricular hypertrophy on      electrocardiogram.  All these findings are suggestive of congestive      heart failure, although surprisingly the BNP is normal at 45.      Nevertheless, the patient has been given Lasix and lisinopril and      Norvasc and carvedilol to treat the high blood pressure.  Will      order cardiac echo to assess cardiac function and better yet,      myocardial perfusion scan will assess cardiac function more      effectively than the screening cardiac echo.  2. Polyarthralgia, polyarthritis, with visible swelling of the      bilateral upper extremities and lower extremities.  This is not      gout.  The sed rate is elevated.  Will rule out rheumatoid      arthritis, polymyalgia rheumatica, other autoimmune diseases that      causes polyarthralgia, polyarthritis, synovitis.  Will also      consider Ryder syndrome since the patient had an apparent urinary      tract infection 1 month ago.  Human immunodeficiency virus, RPR,      and penile culture was ordered and IV Rocephin 1 g was initially      started in the emergency department and will continue with that      pending cultures.  Solu-Medrol is given according to the emergency      department doctor for suspect polymyalgia rheumatica.  This will      also stop acute gout which looks like he has an acute gout of the      left big toe  because this is more swollen and tender compared to      bilateral swelling and edema of the other joints.  Also colchicine      is given for gout.  3. Elevated D-dimer.  CTA is negative for pulmonary embolism.  Will      order deep venous thrombosis prophylaxis.  4. Hypoalbuminemia which most likely is not the reason for the      synovitis and the edema, but will monitor albumin.  The rest of the      liver function tests are normal.  The patient has been advised if      he wants counseling      regarding alcohol abuse and stated that he would like to stop      drinking.  5. Urinalysis showed positive urine as stated above.  Urine culture,      blood culture, and even culture of the penile aspirate.  Rocephin      has been started pending cultures.      Virgie Dad, MD  Electronically Signed     EA/MEDQ  D:  07/06/2009  T:  07/06/2009  Job:  [id number]

## 2011-04-17 NOTE — Discharge Summary (Signed)
NAME:  Darryl Hurley, Darryl Hurley NO.:  [id number]   MEDICAL RECORD NO.:  [id number]          PATIENT TYPE:  INP   LOCATION:  1317                         FACILITY:  [facility]   PHYSICIAN:  Isidor Holts, M.D.  DATE OF BIRTH:  March 04, 1961   DATE OF ADMISSION:  04/29/2007  DATE OF DISCHARGE:  05/02/2007                               DISCHARGE SUMMARY   PRIMARY CARE PHYSICIAN:  PMD is [facility], the patient is unassigned.   DISCHARGE DIAGNOSES:  1. Flare up of polyarticular gout.  2. Hypertension.  3. Smoker.  4. Alcohol excess.   DISCHARGE MEDICATIONS:  1. Lisinopril 20 mg p.o. daily.  2. Ibuprofen 400 mg p.o. t.i.d. with food, for 1 week only.  3. Nicoderm CQ (7 mg/24 hours), 1 patch to skin daily.  4. Prednisone 30 mg p.o. daily for 3 days, then 20 mg p.o. daily for 3      days, then 10 mg p.o. daily for 3 days, then stop.   PROCEDURES:  Bilateral hand x-rays dated Apr 29, 2007.  No acute finding  or evidence of inflammatory arthropathy in either hand.  Marked  degenerative changes of the DIP joint of the little finger of the right  hand is noted.   CONSULTATIONS:  None.   ADMISSION HISTORY:  See H&P notes of Apr 29, 2007. However, in brief,  this is a 50 year old male, with known history of hypertension, gout and  smoking history, who presents with a 2-day history of marked swelling  and tenderness of the right wrist, and also the small joints of both  hands.  On evaluation he was found to have elevated serum uric acid  level of 11.1.  He was admitted for further evaluation, investigation  and management.   CLINICAL COURSE:  1. Polyarticular gout.  The patient had a known history of gout and      periodically has flare-ups.  He presents with yet another flare,      which on presentation was associated with swelling and tenderness      of the right wrist joint, first metacarpophalangeal joints of the      hands, and also proximal interphalangeal joints of the  index      fingers of both hands.  Because of the symmetrical distribution of      these findings the possibility of an inflammatory arthropathy was      entertained.  X-ray of the affected areas however showed no      evidence of synovitis.  Antinuclear antibody and rheumatoid factor      were negative.  The patient was managed with NSAIDs and steroids,      with satisfactory clinical response.  By May 01, 2007 the      inflammatory phenomena had largely subside, the patient had      dramatically improved functionality in the affected joints, was      keen to be discharged.  It is anticipated that he will complete      another 1 week of NSAID therapy, and has been discharged on a      tapering  course of steroids.  Our recommendation is that after      approximately 4 weeks the patient may be commenced on Allopurinol      for prophylactic purposes.  This is deferred to his primary MD.   1. Hypertension.  The patient has a known history of hypertension.      Pre-admission, however he was not on any regular medication.  The      hypertension was managed with Lisinopril, with normalization of the      BP.  He has been recommended a heart-healthy diet.   1. Smoker.  The patient continues to smoke, although during the course      of this admission however he expressed a determination to quit.  He      has been counseled appropriately and commenced on Nicoderm CQ      patch.   1. Alcohol excess.  The patient states that he drinks about 2-3 beers      a day, and his last drink was on Apr 17, 2007.  During the course      of his hospitalization, he showed no evidence of alcohol withdrawal      phenomena.  He has been counseled to limit his drinking somewhat.   DISPOSITION:  The patient was considered sufficiently recovered and  clinically stable to be discharged on May 02, 2007.  He may return to  regular duties on May 05, 2007.   DIET:  Heart-healthy diet.   ACTIVITY:  As tolerated.    FOLLOWUP INSTRUCTIONS:  The patient is instructed to follow up with his  primary MD at [facility] within 1 week of discharge.  He apparently  used to attend [facility] in the past, but has not done so for a long  time now.  He has been supplied the appropriate information and will  reregister at [facility].   SPECIAL INSTRUCTIONS TO PATIENT'S PMD:  Recommended to commence the  patient on prophylactic Allopurinol in approximately 4 week's time.      Isidor Holts, M.D.  Electronically Signed     CO/MEDQ  D:  05/02/2007  T:  05/02/2007  Job:  [id number]   cc:   Medical Department [facility]

## 2011-04-17 NOTE — H&P (Signed)
NAME:  Darryl Hurley, Darryl Hurley NO.:  [id number]   MEDICAL RECORD NO.:  [id number]          PATIENT TYPE:  EMS   LOCATION:  ED                           FACILITY:  [facility]   PHYSICIAN:  Isidor Holts, M.D.  DATE OF BIRTH:  05/18/1961   DATE OF ADMISSION:  04/29/2007  DATE OF DISCHARGE:                              HISTORY & PHYSICAL   CHIEF COMPLAINT:  Pain in the right wrist and small joints of both  hands, for the past 3 days.   HISTORY OF PRESENT ILLNESS:  This is a 50 year old male, with past  medical history significant for hypertension and gout.  Although his PMD  is at [facility], he does not go to the clinic on a regular basis.  According to him, he was quite well until 3 days ago, when he developed  swelling and pain in the right wrist joint and similar symptoms  affecting the small digits of both hands bilaterally.  These symptoms  have continued to progress.  In the past his gout flare-up has mainly  affected his left knee and very occasionally, his right knee.  As the  patient is not on any regular medication, does not regularly see a  physician, he has come to the emergency department.   PAST MEDICAL HISTORY:  1. Hypertension.  2. Gout.  3. Smoking history.   MEDICATION HISTORY:  He is not on any regular medication.   ALLERGIES:  NO KNOWN DRUG ALLERGIES.   REVIEW OF SYSTEMS:  As per HPI and chief complaint negative.  Patient  has no complaints of abdominal pain, vomiting, diarrhea, fever,  shortness of breath or chest pain.   SOCIAL HISTORY:  The patient is single, has an offspring who is alive  and well.  He does not have regular employment, although he works part-  time as a Engineer, water at a service station.  Smokes about 4 to 5 cigarettes  per day, has done so all his life.  Drinks alcohol about three beers per  day.  Last drink was on 04/28/07.   FAMILY HISTORY:  The patient's mother is deceased status post CVA.  She  was diabetic.  His father  killed himself.  Medical history is not known  to the patient.   PHYSICAL EXAMINATION:  VITALS:  Temperature 97.3, pulse 84 per minute  regular, respiratory rate 16, BP 180/127 mmHg, rechecked 142/104 mmHg,  pulse oximeter 96% on room air.  GENERAL:  The patient is somewhat drowsy following intravenous  injections of opiates in the emergency department.  However, he is  easily rousable, alert when roused, communicative, not short of breath  at rest.  HEENT: No clinical pallor or jaundice.  No conjunctival injection.  Throat is clear.  NECK:  Supple.  JVP not seen.  No palpable lymphadenopathy.  No palpable  goiter.  CHEST:  Clinically clear to auscultation.  No wheezes or crackles.  HEART:  Heart sounds S1 and S2 are heard.  Normal, regular, no murmurs.  ABDOMEN:  Full, soft and nontender.  No palpable organomegaly.  No  palpable masses.  Normal bowel sounds.  EXTREMITIES:  No pitting edema, palpable peripheral pulses.  MUSCULOSKELETAL SYSTEM:  The patient has evidence of active synovitis  right wrist as indicated by swelling, increased local temperature,  bogginess and tenderness.  He also experiences substantial pain on  attempts to flex right wrist.  Left wrist examination is quite  unremarkable.  On both hands the patient has swelling of the first  metacarpal phalangeal joint of the thumbs.  Also proximal and distal  interphalangeal joints of the index fingers bilaterally.  Examination of  his lower extremities reveals unremarkable right knee joint.  The left  knee joint is somewhat swollen and a mild effusion is palpable, however,  there is full range of motion and no increased local temperature.  No  pain on passive or active movement.  The rest of the musculoskeletal  examination was quite unremarkable.  CENTRAL NERVOUS SYSTEM:  No focal neurologic deficits on gross  examination.   INVESTIGATION:  CBC:  WBC 10.9, hemoglobin 13.9, hematocrit 40,  platelets 326.  Electrolytes:   Sodium 139, potassium 3.9, chloride 102,  CO2 25, BUN 11, creatinine 1.25, glucose 124, AST 33, ALT 19, uric acid  11.1.   ASSESSMENT AND PLAN:  1. Flare-up of polyarticular gout.  We shall admit the patient because      he is in considerable discomfort, unable to utilize both upper      extremities.  Utilize scheduled NSAID therapy, p.r.n. opioid      analgesics, and steroids. After this acute flare up has subsided,      say in about two to three weeks time, it would be advisable to      commence Allopurinol therapy for prophylactic purposes. For      completeness, due to bilateral symmetrical distribution of      arthropathy, will do ESR, Rheumatoid factor, Anti nuclear antibody      titers.   1. Uncontrolled hypertension.  We shall utilize ACE inhibitor      treatment, monitor, and adjust medications as indicated.   1. Smoking history.  The patient has been counseled appropriately.  We      have instituted Nicoderm CQ patch.   1. Alcohol excess.  The patient's last drink was on 04/28/07.  We shall      counsel the patient appropriately, and watch for alcohol withdrawal      phenomena.  Should this occur, we shall utilize p.r.n.      Benzodiazepine.  Meanwhile, we shall place the patient on vitamin      supplements.   Further management will depend on clinical course.      Isidor Holts, M.D.  Electronically Signed     CO/MEDQ  D:  04/29/2007  T:  04/29/2007  Job:  [id number]

## 2011-04-17 NOTE — Discharge Summary (Signed)
NAME:  Darryl Hurley, FIDEL NO.:  [id number]   MEDICAL RECORD NO.:  [id number]          PATIENT TYPE:  INP   LOCATION:  1428                         FACILITY:  [facility]   PHYSICIAN:  Michelene Gardener, MD    DATE OF BIRTH:  11-04-1961   DATE OF ADMISSION:  07/06/2009  DATE OF DISCHARGE:  07/09/2009                               DISCHARGE SUMMARY   ADDENDUM:  For details about the discharge diagnosis, discharge  medications, procedures, consultations, history and hospital course,  please refer to previously dictated discharge summary done by Dr. Ladell Pier on August 6.   The patient remains stable since the dictation of discharge summary.  He  developed back pain overnight which was evaluated by troponin that came  to be normal and EKG that did not show any evidence of arrhythmia.  His  oxygen was discontinued and he maintained a good saturation on room air.  Sugar was followed and his glucose was 132.  I advised him to be more  compliant with his diet and to follow with his primary doctor for  further adjustment.  Currently, vitals are stable.  The patient will be  discharged today in stable condition.  Will follow with [facility]  within a week.   Total assessment time is 40 minutes.      Michelene Gardener, MD  Electronically Signed     NAE/MEDQ  D:  07/09/2009  T:  07/09/2009  Job:  [id number]

## 2011-04-17 NOTE — Discharge Summary (Signed)
NAME:  Darryl Hurley, Darryl Hurley               ACCOUNT NO.:  [id number]   MEDICAL RECORD NO.:  [id number]          PATIENT TYPE:  INP   LOCATION:  1428                         FACILITY:  [facility]   PHYSICIAN:  Ladell Pier, M.D.   DATE OF BIRTH:  04-06-1961   DATE OF ADMISSION:  07/06/2009  DATE OF DISCHARGE:                               DISCHARGE SUMMARY   DISCHARGE DIAGNOSES:  1. Dyspnea, question of the etiology.  2. Polyarthralgias.  3. Urinary tract infection.  4. Elevated D-dimer with negative spiral CT scan.  5. Obesity.  6. Hypoalbuminemia.  7. Elevated blood sugars, question of diabetes.  8. Gout.   DISCHARGE MEDICATIONS:  1. Norvasc 10 mg daily.  2. Lisinopril 40 mg daily.  3. Cipro 500 mg twice daily for 2 days.  4. Colchicine p.r.n.  5. Allopurinol as directed by PCP.  6. Nifedipine discontinued.   FOLLOWUP APPOINTMENTS:  The patient is to follow up with [facility] in  1 week.   PROCEDURES:  The patient had a 2-D echo done, official results still  pending.  Preliminary results shows normal LV function, mild LVH, no  significant valvular abnormality.  CT scan of the chest showed no PE,  atelectasis, punctate nodular densities near the right minor fissure  which are not significantly changed from the previous CAT scan, slightly  enlarged left and right axillary lymph node, no significant change in  mediastinal lymph nodes.   CONSULTANTS:  None.   HISTORY OF PRESENT ILLNESS:  The patient is a 50 year old African  American male with longstanding history of untreated hypertension who  was brought to the emergency room by his wife secondary to generalized  swelling and tenderness throughout all the joints especially the elbows,  both hands, both knees, ankles, feet.  Please see admission note for  remainder of history.   Past medical history, family history, social history, meds, allergies  and review of systems per admission H and P.   PHYSICAL EXAMINATION ON DISCHARGE:   VITAL SIGNS:  Temperature 98.3,  pulse of 59, respirations 19, blood pressure 144/93, pulse ox 97% on  room air.  GENERAL:  The patient is sitting up in bed, does not seem to be in any  acute distress.  HEENT:  Head is normocephalic, atraumatic.  Pupils reactive to light.  Throat is without erythema.  CARDIOVASCULAR:  Regular rate and rhythm.  LUNGS:  He has some rhonchi throughout.  ABDOMEN:  Positive bowel sounds.  EXTREMITIES:  Without edema.   HOSPITAL COURSE:  1. Dyspnea:  The patient was admitted to the hospital.  It was first      thought that he had new onset CHF.  He was started on CHF regimen      but his echo came back normal.  His dyspnea improved.  He did have      some blood work with some inflammation noted with elevated D-dimer      and mild elevation of rheumatoid factor of 23 and C-reactive      protein of 37.8.  The patient's ANA however is negative.  The  patient however needs to follow up outpatient with his primary care      doctor for further workup of these findings.  He may need a      rheumatologic workup with his joint symptoms.  I am unsure of the      etiology of his dyspnea with the CT findings and his 2-D echo      findings.  His symptoms have now resolved.  2. Hypertension:  The patient was hypertensive with admission.  His      blood pressures are improving with the addition of medications.      Will continue to monitor.  3. Elevated blood sugar:  His blood sugar was noted to be 370.  Will      check his hemoglobin A1c and will do Accu-Cheks and cover with      sliding scale insulin.  It could be that his blood sugar is      elevated from getting Solu-Medrol IV in the emergency room.  Will      monitor his blood sugars for now and check his hemoglobin A1c.  If      he does have diabetes then he will need to get diabetes teaching      and be discharged on antidiabetic medication.  He also has a BMP      scheduled which will have a fasting blood  sugar on it in the      morning as well. An addendum will be dictated for this discharge      summary with the results of these findings.   DISCHARGE LABS:  WBC 17.6, hemoglobin 11.4, MCV 88.8, platelets 317.  Sodium 136, potassium 3.7, chloride 100, CO2 26, glucose 317, BUN 20,  creatinine 1.13, rheumatoid factor of 23.  HIV negative.  C-reactive  protein 37.8, cryptococcal antigen negative.  CK 111, MB 1.2, troponin  0.02, RPR nonreactive.  GC and chlamydia negative.  BNP less than 30, D-  dimer of 3.95.  CT angiogram of the chest negative for PE, dependent  atelectasis, punctate nodular densities near the right minor fissure  which have not significantly changed.      Ladell Pier, M.D.  Electronically Signed     NJ/MEDQ  D:  07/08/2009  T:  07/08/2009  Job:  [id number]

## 2011-04-20 NOTE — Consult Note (Signed)
NAME:  Darryl Hurley, TORAL NO.:  [id number]   MEDICAL RECORD NO.:  [id number]                   PATIENT TYPE:  EMS   LOCATION:  MAJO                                 FACILITY:  [facility]   PHYSICIAN:  Candyce Churn, M.D.          DATE OF BIRTH:  Sep 25, 1961   DATE OF CONSULTATION:  09/11/2003  DATE OF DISCHARGE:                                   CONSULTATION   EMERGENCY ROOM CONSULTATION   FINDINGS:  1. Olecranon bursitis - likely secondary to gout.  2. History of gout.   TREATMENT:  1. Colchicine 0.6 mg b.i.d. to t.i.d. p.r.n. left elbow pain.  2. Indocin 50 mg p.o. b.i.d. p.r.n. elbow pain.  3. Keflex 500 mg t.i.d. for seven days for possible mild bronchitis.   EMERGENCY ROOM COURSE:  Mr. Janard Culp is a pleasant 50 year old male who  has a history of gout.  He started to develop left elbow pain this morning.  This progressed during the day.  He also started to develop a congested  cough in the last couple of days.  He denies any chills.  He is on no  medications currently but has taken colchicine and Indocin in the past for  gouty episodes.  His gouty episodes usually involve his great toe or knee  but he had had it in the midfoot.  On physical examination he is somewhat  somnolent secondary to treatment with Vicodin one p.o. x1; he is arousable,  however.  He states that his elbow does not hurt to flex and extend the  forearm but is painful over the olecranon bursa area on the left.   PHYSICAL EXAMINATION:  VITAL SIGNS:  His temperature initially was 99.0 and  Tmax was 100.6.  Pulse initially was 104, respiratory rate 20 and easy.  Pain was 9/10 on his scale when first admitted.  O2 saturation is 83% on  room air.  HEENT:  Benign.  NECK:  Supple without JVD.  CHEST:  Has coarse breath sounds bilaterally but there are no expiratory  wheezes or rales.  CARDIAC:  Regular rhythm without murmur, rub, or gallop.  ABDOMEN:  Soft, nontender.  EXTREMITIES:  Within normal limits except for the left upper extremity.  He  has swelling of the posterior aspect of the left elbow and tenderness over  the olecranon bursa.   The ER physicians tried to aspirate the olecranon bursa but did not obtain  any fluid.   LABORATORY DATA:  Reveals white count 11,000; hemoglobin 13.8; platelet  count 222,000.  He had 76% neutrophils, 50% lymphocytes, and 7% monocytes.  Sodium is 140, potassium 3.5, chloride 108, bicarb 25, glucose 180, BUN 18,  creatinine 1.5, calcium 8.9.  Uric acid level elevated at 9.8.   ASSESSMENT:  I suspect that he has the following:  1. Acute gouty bursitis involving the left olecranon bursa.  2. May have a low-grade bronchitis secondary to smoking.  I should mention that he did have a respiratory treatment while admitted and  felt like his breathing improved a great deal.  I do not hear any chronic  wheezing.   Chest x-ray revealed no active disease and left elbow x-ray revealed  olecranon spur and fluid to suggest olecranon bursitis.   The patient will be discharged on the above medications and he will return  to [facility] for follow-up, but if he acutely worsens over the next 24-48  hours he will definitely return for follow-up.                                               Candyce Churn, M.D.    RNG/MEDQ  D:  09/11/2003  T:  09/11/2003  Job:  [id number]   cc:   [facility] on [address]

## 2011-08-28 LAB — CBC
MCHC: 35.4
Platelets: 265
RDW: 15.3

## 2011-08-28 LAB — URIC ACID: Uric Acid, Serum: 10.1 — ABNORMAL HIGH

## 2011-08-31 LAB — DIFFERENTIAL
Basophils Relative: 0
Eosinophils Absolute: 0.1
Lymphs Abs: 1.2
Neutro Abs: 7.2
Neutrophils Relative %: 78 — ABNORMAL HIGH

## 2011-08-31 LAB — CBC
Platelets: 344
RDW: 14.6
WBC: 9.2

## 2011-08-31 LAB — COMPREHENSIVE METABOLIC PANEL
ALT: 22
Alkaline Phosphatase: 65
BUN: 15
CO2: 26
Calcium: 10
GFR calc non Af Amer: >60
Glucose, Bld: 124 — ABNORMAL HIGH
Sodium: 137

## 2011-08-31 LAB — URIC ACID: Uric Acid, Serum: 10.5 — ABNORMAL HIGH

## 2011-08-31 LAB — POCT CARDIAC MARKERS: Myoglobin, poc: 60

## 2011-12-06 ENCOUNTER — Emergency Department (HOSPITAL_COMMUNITY)
Admission: EM | Admit: 2011-12-06 | Discharge: 2011-12-06 | Discharge status: Against Medical Advice | Payer: Medicaid Other

## 2011-12-06 NOTE — ED Notes (Signed)
Rt. Wrist is also red, swollen and painful

## 2011-12-06 NOTE — ED Notes (Signed)
Rt. Foot pain hx of gout, for  A few months, lower back pain, also rt. Upper thigh pain, Pt. Need pain medication

## 2011-12-07 NOTE — ED Notes (Signed)
Unable to locate pt in department.

## 2011-12-08 ENCOUNTER — Emergency Department (HOSPITAL_COMMUNITY)
Admission: EM | Admit: 2011-12-08 | Discharge: 2011-12-08 | Disposition: A | Discharge status: Home / Self Care | Payer: Medicaid Other | Attending: Emergency Medicine | Admitting: Emergency Medicine

## 2011-12-08 ENCOUNTER — Encounter (HOSPITAL_COMMUNITY): Payer: Self-pay | Admitting: Emergency Medicine

## 2011-12-08 DIAGNOSIS — M79609 Pain in unspecified limb: Secondary | ICD-10-CM | POA: Insufficient documentation

## 2011-12-08 DIAGNOSIS — R070 Pain in throat: Secondary | ICD-10-CM | POA: Insufficient documentation

## 2011-12-08 DIAGNOSIS — Z79899 Other long term (current) drug therapy: Secondary | ICD-10-CM | POA: Insufficient documentation

## 2011-12-08 DIAGNOSIS — M549 Dorsalgia, unspecified: Secondary | ICD-10-CM

## 2011-12-08 DIAGNOSIS — M545 Low back pain, unspecified: Secondary | ICD-10-CM | POA: Insufficient documentation

## 2011-12-08 DIAGNOSIS — M25569 Pain in unspecified knee: Secondary | ICD-10-CM | POA: Insufficient documentation

## 2011-12-08 DIAGNOSIS — R269 Unspecified abnormalities of gait and mobility: Secondary | ICD-10-CM | POA: Insufficient documentation

## 2011-12-08 DIAGNOSIS — M542 Cervicalgia: Secondary | ICD-10-CM

## 2011-12-08 DIAGNOSIS — M109 Gout, unspecified: Secondary | ICD-10-CM | POA: Insufficient documentation

## 2011-12-08 DIAGNOSIS — M25469 Effusion, unspecified knee: Secondary | ICD-10-CM | POA: Insufficient documentation

## 2011-12-08 DIAGNOSIS — I10 Essential (primary) hypertension: Secondary | ICD-10-CM | POA: Insufficient documentation

## 2011-12-08 DIAGNOSIS — G8929 Other chronic pain: Secondary | ICD-10-CM | POA: Insufficient documentation

## 2011-12-08 DIAGNOSIS — M7989 Other specified soft tissue disorders: Secondary | ICD-10-CM | POA: Insufficient documentation

## 2011-12-08 HISTORY — DX: Essential (primary) hypertension: I10

## 2011-12-08 MED ORDER — IBUPROFEN 600 MG PO TABS: 600.0000 mg | ORAL_TABLET | Freq: Four times a day (QID) | ORAL | Status: AC | PRN

## 2011-12-08 MED ORDER — OXYCODONE-ACETAMINOPHEN 5-325 MG PO TABS: 1.0000 | ORAL_TABLET | ORAL | Status: AC | PRN

## 2011-12-08 MED ORDER — DIAZEPAM 5 MG PO TABS: 5.0000 mg | ORAL_TABLET | Freq: Three times a day (TID) | ORAL | Status: AC | PRN

## 2011-12-08 MED ORDER — PREDNISONE 20 MG PO TABS
60.0000 mg | ORAL_TABLET | Freq: Once | ORAL | Status: AC
  Administered 2011-12-08: 60 mg via ORAL
  Filled 2011-12-08: qty 3

## 2011-12-08 MED ORDER — ATENOLOL 25 MG PO TABS: 100.0000 mg | ORAL_TABLET | Freq: Every day | ORAL | Status: DC

## 2011-12-08 MED ORDER — CLONIDINE HCL 0.2 MG PO TABS: 0.2000 mg | ORAL_TABLET | Freq: Two times a day (BID) | ORAL | Status: DC

## 2011-12-08 MED ORDER — PREDNISONE 20 MG PO TABS: 60.0000 mg | ORAL_TABLET | Freq: Every day | ORAL | Status: DC

## 2011-12-08 MED ORDER — OXYCODONE-ACETAMINOPHEN 5-325 MG PO TABS
2.0000 | ORAL_TABLET | Freq: Once | ORAL | Status: AC
  Administered 2011-12-08: 2 via ORAL
  Filled 2011-12-08: qty 2

## 2011-12-08 NOTE — ED Provider Notes (Signed)
History     CSN: [id number]  Arrival date & time 12/08/11  1128   First MD Initiated Contact with Patient 12/08/11 1512      Chief Complaint  Patient presents with  . Sore Throat    (Consider location/radiation/quality/duration/timing/severity/associated sxs/prior treatment) Patient is a 51 y.o. male presenting with musculoskeletal pain. The history is provided by the patient.  Muscle Pain This is a new problem. The current episode started in the past 7 days. The problem occurs constantly. The problem has been gradually worsening. Associated symptoms include arthralgias and joint swelling. Pertinent negatives include no fever, headaches, myalgias or weakness. The symptoms are aggravated by standing and walking. He has tried NSAIDs for the symptoms.  Pt states he has gout. States has had inflammation, swelling, pain in right big toe, right knee, right hand. Feels like previous gout. Denies fever, chills, injury. States also having neck pain and back pain that he says is chronic. Denies urinary symptoms, denies headache, dizziness, abdominal pain. Pt taking colchicine, tramadol with no relief. Also ran out of his blood pressure medications.   Past Medical History  Diagnosis Date  . Hypertension   . Gout   . Angina pectoris     History reviewed. No pertinent past surgical history.  No family history on file.  History  Substance Use Topics  . Smoking status: Not on file  . Smokeless tobacco: Not on file  . Alcohol Use: Not on file      Review of Systems  Constitutional: Negative.  Negative for fever.  HENT: Negative.   Eyes: Negative.   Respiratory: Negative.   Cardiovascular: Negative.   Genitourinary: Negative.   Musculoskeletal: Positive for joint swelling, arthralgias and gait problem. Negative for myalgias.  Neurological: Negative for dizziness, weakness, light-headedness and headaches.  Psychiatric/Behavioral: Negative.     Allergies  Review of patient's  allergies indicates no known allergies.  Home Medications   Current Outpatient Rx  Name Route Sig Dispense Refill  . CLONIDINE HCL 0.2 MG PO TABS Oral Take 0.2 mg by mouth 2 (two) times daily.     . COLCHICINE 0.6 MG PO TABS Oral Take 0.6 mg by mouth 2 (two) times daily. For gout (colcrys)     . ATENOLOL 100 MG PO TABS Oral Take 100 mg by mouth daily.     . TRAMADOL HCL 50 MG PO TABS Oral Take 50 mg by mouth every 8 (eight) hours as needed. For pain.       BP 164/108  Pulse 88  Resp 16  SpO2 98%  Physical Exam  Nursing note and vitals reviewed. Constitutional: He is oriented to person, place, and time. He appears well-developed and well-nourished. No distress.  HENT:  Head: Normocephalic and atraumatic.  Nose: Nose normal.  Mouth/Throat: Oropharynx is clear and moist.  Eyes: Pupils are equal, round, and reactive to light.  Neck: Neck supple.  Cardiovascular: Normal rate, regular rhythm and normal heart sounds.   Pulmonary/Chest: Effort normal and breath sounds normal. No respiratory distress.  Musculoskeletal: Normal range of motion.       Swelling erythema, tenderness with light touch over right MTP joint of the great toe, swelling and tenderness over right knee. Full rom, joint stable. Swelling and erythema noted over right hand. Tender to palpation. Good cap refill over all fingers. No erythema stranding from the swelling. Full rom of neck. Pain with palpation over right trapezius muscle and left lower back. No midline tenderness. Normal strength and patellar reflexes over  bilateral LE.   Neurological: He is alert and oriented to person, place, and time.  Skin: Skin is warm and dry.  Psychiatric: He has a normal mood and affect.    ED Course  Procedures (including critical care time)  Pt with chronic gout that is worsening, and chronic back pain. No new injuries. Doubt infection, swelling now for almost 6 months, no fever, no malaise. Will start on NSAIDs, stronger pain meds,  prednisone.   MDM          Lottie Mussel, PA 12/08/11 1536

## 2011-12-08 NOTE — ED Provider Notes (Signed)
Medical screening examination/treatment/procedure(s) were performed by non-physician practitioner and as supervising physician I was immediately available for consultation/collaboration.  Donnetta Hutching, MD 12/08/11 1540

## 2011-12-08 NOTE — ED Notes (Signed)
Pt. Stated, I've had gout for 6 months, I'm having neck, throat and back pain for 4 days.

## 2012-01-11 ENCOUNTER — Encounter (HOSPITAL_COMMUNITY): Payer: Self-pay | Admitting: *Deleted

## 2012-01-11 ENCOUNTER — Emergency Department (HOSPITAL_COMMUNITY)
Admission: EM | Admit: 2012-01-11 | Discharge: 2012-01-11 | Disposition: A | Discharge status: Home / Self Care | Payer: Medicaid Other | Attending: Emergency Medicine | Admitting: Emergency Medicine

## 2012-01-11 ENCOUNTER — Emergency Department (HOSPITAL_COMMUNITY): Payer: Medicaid Other

## 2012-01-11 DIAGNOSIS — R4789 Other speech disturbances: Secondary | ICD-10-CM | POA: Insufficient documentation

## 2012-01-11 DIAGNOSIS — M25519 Pain in unspecified shoulder: Secondary | ICD-10-CM | POA: Insufficient documentation

## 2012-01-11 DIAGNOSIS — M25512 Pain in left shoulder: Secondary | ICD-10-CM

## 2012-01-11 DIAGNOSIS — Z8639 Personal history of other endocrine, nutritional and metabolic disease: Secondary | ICD-10-CM | POA: Insufficient documentation

## 2012-01-11 DIAGNOSIS — I10 Essential (primary) hypertension: Secondary | ICD-10-CM | POA: Insufficient documentation

## 2012-01-11 DIAGNOSIS — M25419 Effusion, unspecified shoulder: Secondary | ICD-10-CM | POA: Insufficient documentation

## 2012-01-11 DIAGNOSIS — Z862 Personal history of diseases of the blood and blood-forming organs and certain disorders involving the immune mechanism: Secondary | ICD-10-CM | POA: Insufficient documentation

## 2012-01-11 DIAGNOSIS — R29898 Other symptoms and signs involving the musculoskeletal system: Secondary | ICD-10-CM | POA: Insufficient documentation

## 2012-01-11 DIAGNOSIS — Z79899 Other long term (current) drug therapy: Secondary | ICD-10-CM | POA: Insufficient documentation

## 2012-01-11 IMAGING — CR DG SHOULDER 2+V*L*
1 series · 2 of 2 positions shown · non-contrast
Comparison: None.

CLINICAL DATA: Shoulder pain

LEFT SHOULDER - 2+ VIEW

[Series 1: AP · U · 2 of 2 slices shown]
[im 1/2]
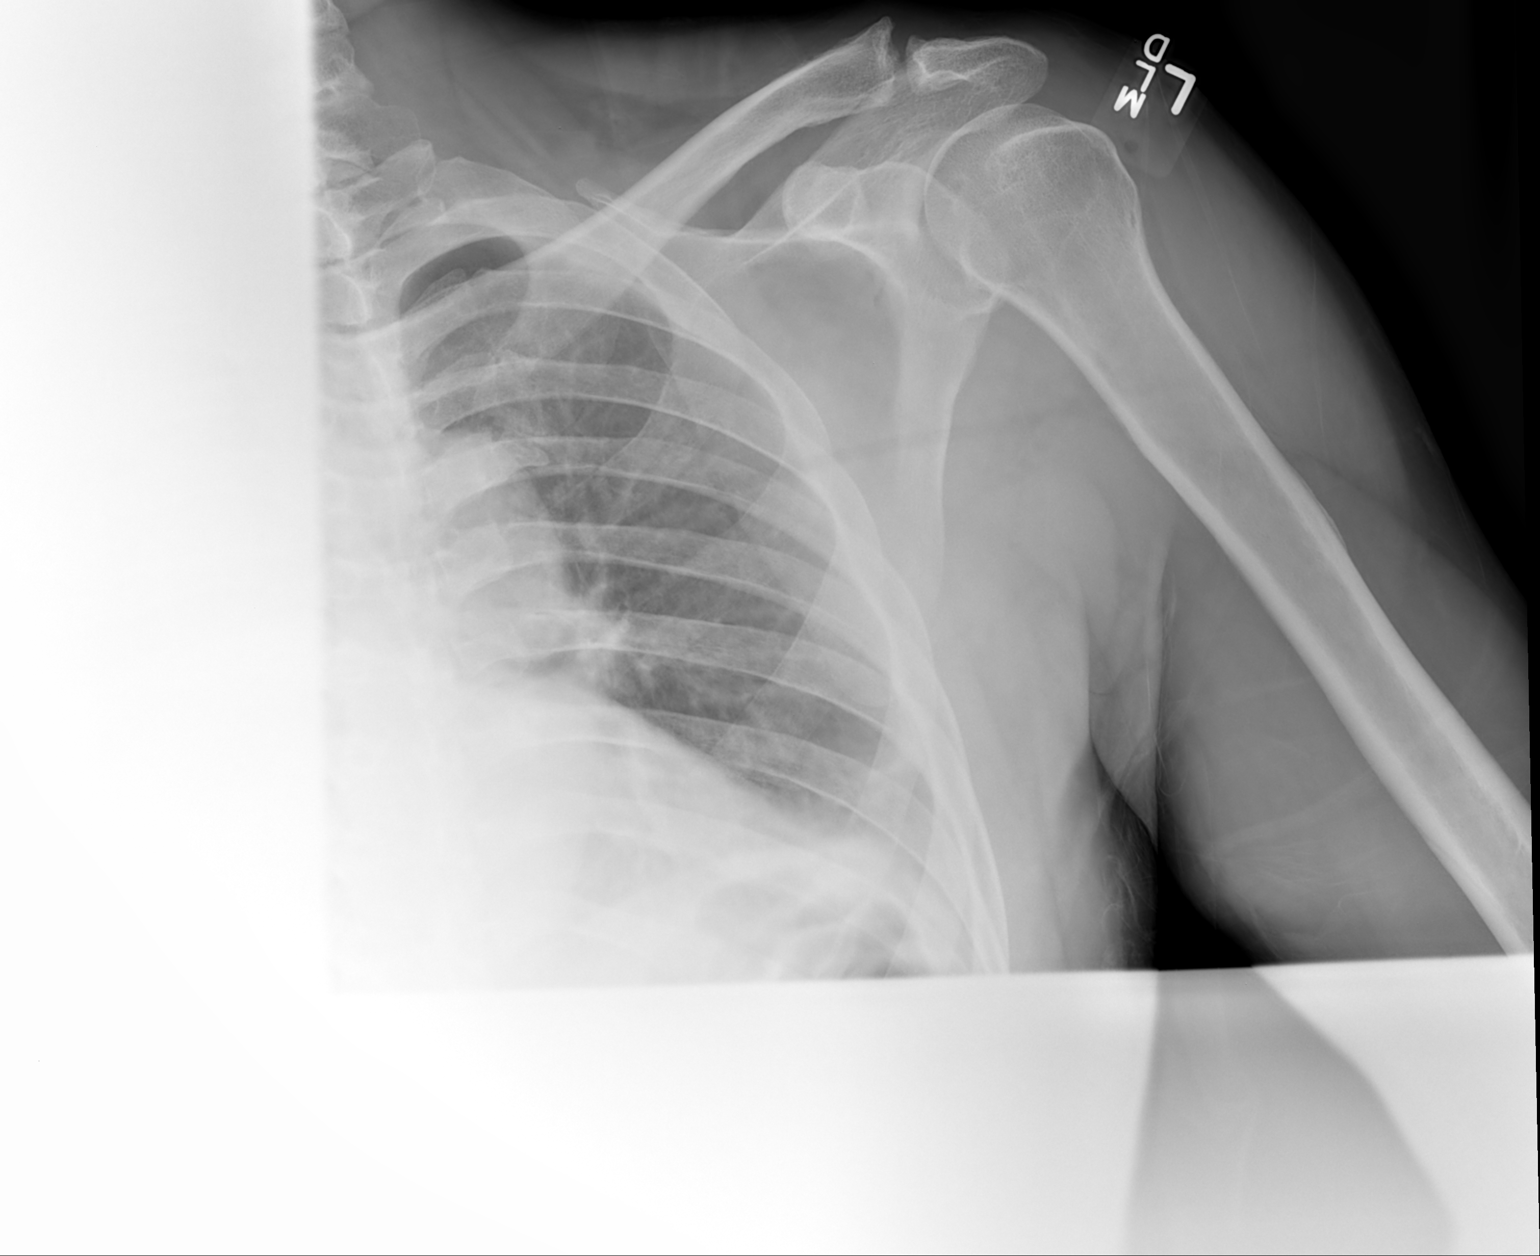
[im 2/2]
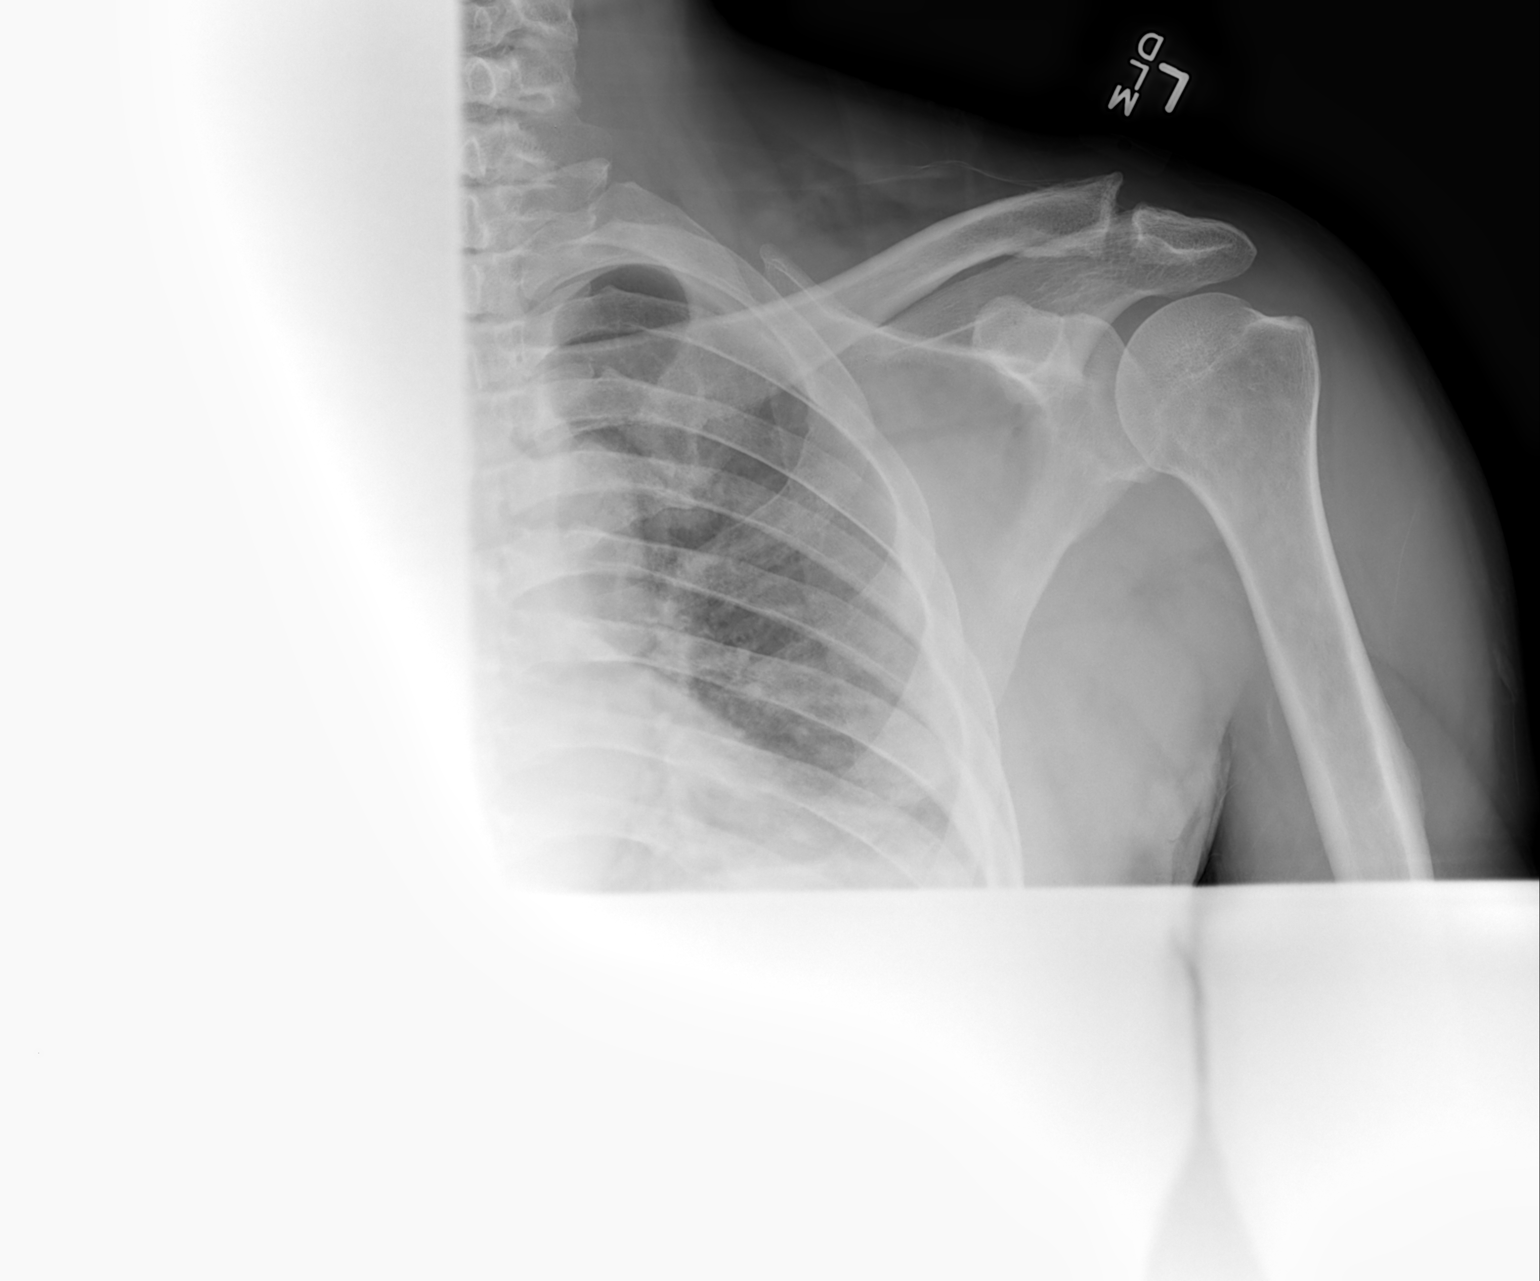

[2 of 2 positions shown; findings below may reference images not displayed]

FINDINGS: Two views of the left shoulder submitted.  There is no
acute fracture or subluxation.  Mild degenerative changes left
acromioclavicular joint.
IMPRESSION: No acute fracture or subluxation.  Mild degenerative changes left
AC joint.

## 2012-01-11 MED ORDER — INDOMETHACIN 50 MG PO CAPS
50.0000 mg | ORAL_CAPSULE | Freq: Once | ORAL | Status: AC
  Administered 2012-01-11: 50 mg via ORAL
  Filled 2012-01-11: qty 1

## 2012-01-11 MED ORDER — INDOMETHACIN 50 MG PO CAPS
50.0000 mg | ORAL_CAPSULE | Freq: Three times a day (TID) | ORAL | Status: AC
Start: 2012-01-11 — End: 2012-04-10

## 2012-01-11 NOTE — ED Provider Notes (Signed)
History     CSN: [id number]  Arrival date & time 01/11/12  1651   First MD Initiated Contact with Patient 01/11/12 1728      Chief Complaint  Patient presents with  . Shoulder Pain    (Consider location/radiation/quality/duration/timing/severity/associated sxs/prior treatment) HPI Comments: Patient here with left shoulder pain - denies any injury - states with history of gout - worse in left elbow - pain with movement of joint  Patient is a 51 y.o. male presenting with shoulder pain. The history is provided by the patient. No language interpreter was used.  Shoulder Pain This is a new problem. The current episode started in the past 7 days. The problem occurs constantly. The problem has been unchanged. Associated symptoms include arthralgias, joint swelling and weakness. Pertinent negatives include no abdominal pain, anorexia, change in bowel habit, chest pain, chills, congestion, coughing, diaphoresis, fatigue, fever, headaches, myalgias, nausea, neck pain, numbness, rash, sore throat, swollen glands, urinary symptoms, vertigo, visual change or vomiting. The symptoms are aggravated by bending. He has tried nothing for the symptoms. The treatment provided no relief.    Past Medical History  Diagnosis Date  . Hypertension   . Gout   . Angina pectoris     History reviewed. No pertinent past surgical history.  History reviewed. No pertinent family history.  History  Substance Use Topics  . Smoking status: Not on file  . Smokeless tobacco: Not on file  . Alcohol Use: Not on file      Review of Systems  Constitutional: Negative for fever, chills, diaphoresis and fatigue.  HENT: Negative for congestion, sore throat and neck pain.   Respiratory: Negative for cough.   Cardiovascular: Negative for chest pain.  Gastrointestinal: Negative for nausea, vomiting, abdominal pain, anorexia and change in bowel habit.  Musculoskeletal: Positive for joint swelling and arthralgias.  Negative for myalgias.  Skin: Negative for rash.  Neurological: Positive for weakness. Negative for vertigo, numbness and headaches.  All other systems reviewed and are negative.    Allergies  Review of patient's allergies indicates no known allergies.  Home Medications   Current Outpatient Rx  Name Route Sig Dispense Refill  . ATENOLOL 100 MG PO TABS Oral Take 100 mg by mouth daily.       BP 132/79  Pulse 91  Temp(Src) 98.9 F (37.2 C) (Oral)  Resp 14  SpO2 96%  Physical Exam  Nursing note and vitals reviewed. Constitutional: He is oriented to person, place, and time. He appears well-developed and well-nourished. No distress.       Slurred speech  HENT:  Head: Normocephalic and atraumatic.  Right Ear: External ear normal.  Left Ear: External ear normal.  Nose: Nose normal.  Mouth/Throat: Oropharynx is clear and moist. No oropharyngeal exudate.  Eyes: Conjunctivae are normal. Pupils are equal, round, and reactive to light. No scleral icterus.  Neck: Normal range of motion. Neck supple.  Cardiovascular: Normal rate, regular rhythm and normal heart sounds.  Exam reveals no gallop and no friction rub.   No murmur heard. Pulmonary/Chest: Effort normal and breath sounds normal. No respiratory distress. He exhibits no tenderness.  Abdominal: Soft. Bowel sounds are normal. He exhibits no distension. There is no tenderness.  Musculoskeletal:       Left shoulder: He exhibits decreased range of motion and tenderness. He exhibits no bony tenderness, no swelling, no effusion, no crepitus, no deformity and normal strength.  Lymphadenopathy:    He has no cervical adenopathy.  Neurological: He is  alert and oriented to person, place, and time. No cranial nerve deficit.  Skin: Skin is warm and dry.  Psychiatric: He has a normal mood and affect. His behavior is normal. Judgment and thought content normal.    ED Course  Procedures (including critical care time)  Labs Reviewed - No  data to display Dg Shoulder Left  01/11/2012  *RADIOLOGY REPORT*  Clinical Data: Shoulder pain  LEFT SHOULDER - 2+ VIEW  Comparison: None.  Findings: Two views of the left shoulder submitted.  There is no acute fracture or subluxation.  Mild degenerative changes left acromioclavicular joint.  IMPRESSION: No acute fracture or subluxation.  Mild degenerative changes left AC joint.  Original Report Authenticated By: Natasha Mead, M.D.     Left shoulder pain    MDM  Patient with history of gout with pain in left shoulder - no fracture - mild degenerative changes to joint - likely arthritis - placed on indocin - patient currently intoxicated, will not prescribe stronger pain medication - placed in shoulder sling per Meryle Ready C. [place], [place] 01/11/12 1821

## 2012-01-11 NOTE — ED Provider Notes (Signed)
Medical screening examination/treatment/procedure(s) were performed by non-physician practitioner and as supervising physician I was immediately available for consultation/collaboration.   Forbes Cellar, MD 01/11/12 [phone]

## 2012-01-11 NOTE — ED Notes (Signed)
Pt in c/o left shoulder pain x4 days, states he has been drinking today to try to cure the pain, pain increased with movement, states he has gout

## 2012-03-01 ENCOUNTER — Emergency Department (HOSPITAL_COMMUNITY)
Admission: EM | Admit: 2012-03-01 | Discharge: 2012-03-01 | Disposition: A | Discharge status: Home / Self Care | Payer: Medicaid Other | Attending: Emergency Medicine | Admitting: Emergency Medicine

## 2012-03-01 ENCOUNTER — Encounter (HOSPITAL_COMMUNITY): Payer: Self-pay | Admitting: *Deleted

## 2012-03-01 ENCOUNTER — Other Ambulatory Visit: Payer: Self-pay

## 2012-03-01 ENCOUNTER — Emergency Department (HOSPITAL_COMMUNITY): Payer: Medicaid Other

## 2012-03-01 DIAGNOSIS — F10929 Alcohol use, unspecified with intoxication, unspecified: Secondary | ICD-10-CM

## 2012-03-01 DIAGNOSIS — I10 Essential (primary) hypertension: Secondary | ICD-10-CM | POA: Insufficient documentation

## 2012-03-01 DIAGNOSIS — M549 Dorsalgia, unspecified: Secondary | ICD-10-CM | POA: Insufficient documentation

## 2012-03-01 DIAGNOSIS — R0789 Other chest pain: Secondary | ICD-10-CM

## 2012-03-01 DIAGNOSIS — R05 Cough: Secondary | ICD-10-CM | POA: Insufficient documentation

## 2012-03-01 DIAGNOSIS — Z8639 Personal history of other endocrine, nutritional and metabolic disease: Secondary | ICD-10-CM | POA: Insufficient documentation

## 2012-03-01 DIAGNOSIS — R0602 Shortness of breath: Secondary | ICD-10-CM | POA: Insufficient documentation

## 2012-03-01 DIAGNOSIS — R059 Cough, unspecified: Secondary | ICD-10-CM | POA: Insufficient documentation

## 2012-03-01 DIAGNOSIS — R071 Chest pain on breathing: Secondary | ICD-10-CM | POA: Insufficient documentation

## 2012-03-01 DIAGNOSIS — Z862 Personal history of diseases of the blood and blood-forming organs and certain disorders involving the immune mechanism: Secondary | ICD-10-CM | POA: Insufficient documentation

## 2012-03-01 DIAGNOSIS — Z79899 Other long term (current) drug therapy: Secondary | ICD-10-CM | POA: Insufficient documentation

## 2012-03-01 DIAGNOSIS — IMO0001 Reserved for inherently not codable concepts without codable children: Secondary | ICD-10-CM | POA: Insufficient documentation

## 2012-03-01 DIAGNOSIS — F1010 Alcohol abuse, uncomplicated: Secondary | ICD-10-CM | POA: Insufficient documentation

## 2012-03-01 LAB — URINALYSIS, ROUTINE W REFLEX MICROSCOPIC
Leukocytes, UA: NEGATIVE
Nitrite: NEGATIVE
Specific Gravity, Urine: 1.014 (ref 1.005–1.030)
pH: 5 (ref 5.0–8.0)

## 2012-03-01 LAB — URINE MICROSCOPIC-ADD ON

## 2012-03-01 LAB — COMPREHENSIVE METABOLIC PANEL
ALT: 10 U/L (ref 0–53)
AST: 21 U/L (ref 0–37)
Albumin: 3.3 g/dL — ABNORMAL LOW (ref 3.5–5.2)
Alkaline Phosphatase: 61 U/L (ref 39–117)
Glucose, Bld: 126 mg/dL — ABNORMAL HIGH (ref 70–99)
Potassium: 3.5 mEq/L (ref 3.5–5.1)
Sodium: 140 mEq/L (ref 135–145)
Total Protein: 7.2 g/dL (ref 6.0–8.3)

## 2012-03-01 LAB — CBC
Hemoglobin: 13.7 g/dL (ref 13.0–17.0)
MCH: 29.2 pg (ref 26.0–34.0)
MCV: 88.7 fL (ref 78.0–100.0)
RBC: 4.69 MIL/uL (ref 4.22–5.81)

## 2012-03-01 LAB — DIFFERENTIAL
Basophils Absolute: 0 10*3/uL (ref 0.0–0.1)
Basophils Relative: 0 % (ref 0–1)
Eosinophils Absolute: 0.1 10*3/uL (ref 0.0–0.7)
Lymphocytes Relative: 26 % (ref 12–46)
Neutrophils Relative %: 68 % (ref 43–77)

## 2012-03-01 LAB — CARDIAC PANEL(CRET KIN+CKTOT+MB+TROPI): Relative Index: 0.8 (ref 0.0–2.5)

## 2012-03-01 LAB — ETHANOL: Alcohol, Ethyl (B): 195 mg/dL — ABNORMAL HIGH (ref 0–11)

## 2012-03-01 LAB — RAPID URINE DRUG SCREEN, HOSP PERFORMED
Benzodiazepines: NOT DETECTED
Cocaine: NOT DETECTED

## 2012-03-01 IMAGING — CR DG CHEST 1V
1 series · 1 of 1 positions shown · non-contrast
Comparison: [DATE]

CLINICAL DATA: Left-sided chest pain.  Status seizures.  Altered
mental status.  Pain for 3 weeks.

CHEST - 1 VIEW

[w chest pa]
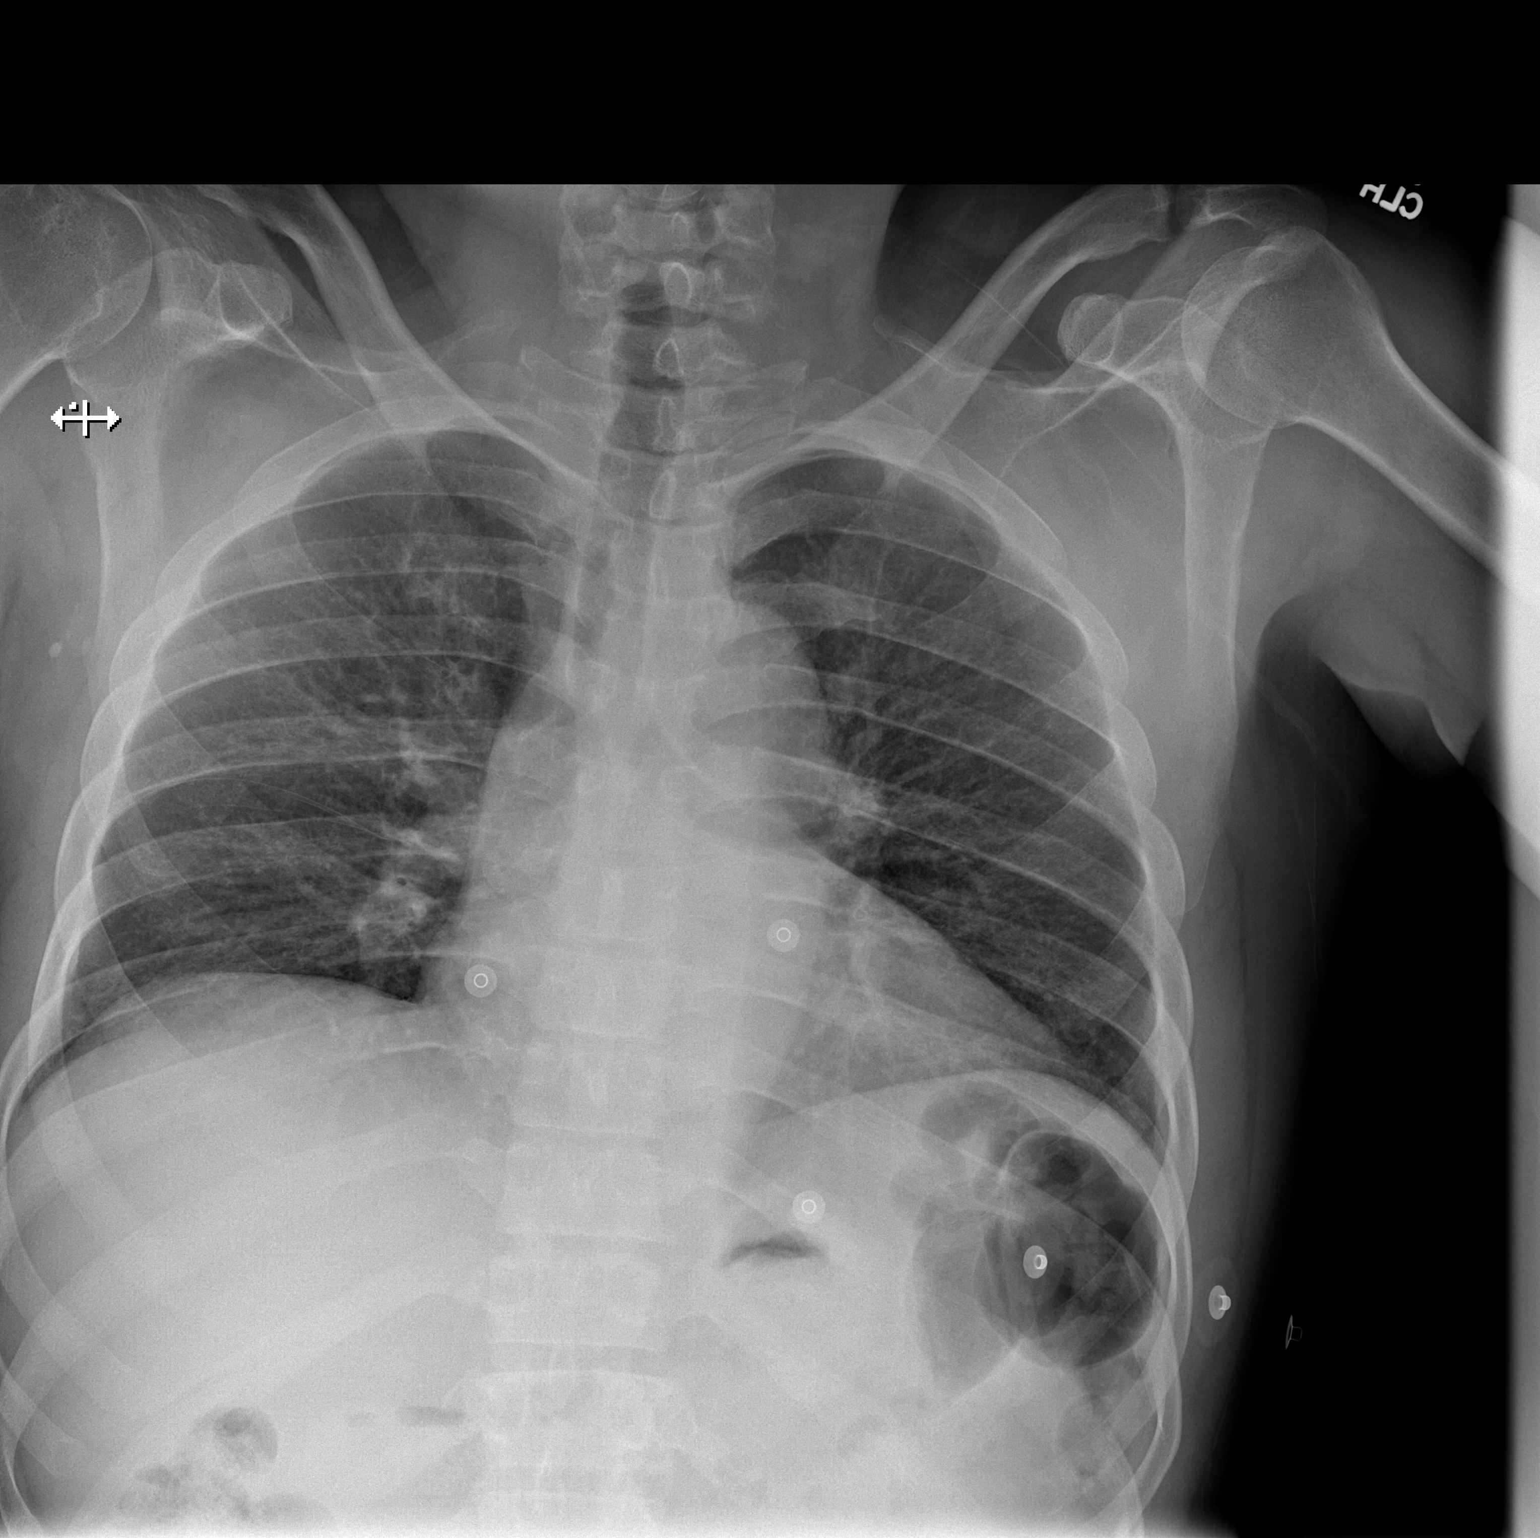

[1 of 1 positions shown; findings below may reference images not displayed]

FINDINGS: Shallow inspiration.  Normal heart size and pulmonary
vascularity.  Vascular crowding due to shallow inspiration.  No
focal airspace consolidation in the lungs.  No blunting of
costophrenic angles.  No pneumothorax.  No significant change since
previous study.
IMPRESSION: No evidence of active pulmonary disease.

## 2012-03-01 MED ORDER — TRAMADOL HCL 50 MG PO TABS
ORAL_TABLET | ORAL | Status: AC
  Filled 2012-03-01: qty 1

## 2012-03-01 MED ORDER — KETOROLAC TROMETHAMINE 30 MG/ML IJ SOLN
30.0000 mg | Freq: Once | INTRAMUSCULAR | Status: AC
  Administered 2012-03-01: 30 mg via INTRAVENOUS
  Filled 2012-03-01: qty 1

## 2012-03-01 MED ORDER — METHOCARBAMOL 100 MG/ML IJ SOLN
1000.0000 mg | Freq: Once | INTRAVENOUS | Status: AC
  Administered 2012-03-01: 1000 mg via INTRAVENOUS
  Filled 2012-03-01: qty 10

## 2012-03-01 MED ORDER — SODIUM CHLORIDE 0.9 % IV BOLUS (SEPSIS)
1000.0000 mL | Freq: Once | INTRAVENOUS | Status: AC
  Administered 2012-03-01: 1000 mL via INTRAVENOUS

## 2012-03-01 MED ORDER — TRAMADOL HCL 50 MG PO TABS
50.0000 mg | ORAL_TABLET | Freq: Once | ORAL | Status: AC
  Administered 2012-03-01: 50 mg via ORAL

## 2012-03-01 MED ORDER — METHOCARBAMOL 500 MG PO TABS: 500.0000 mg | ORAL_TABLET | Freq: Two times a day (BID) | ORAL | Status: AC

## 2012-03-01 MED ORDER — TRAMADOL HCL 50 MG PO TABS: 50.0000 mg | ORAL_TABLET | Freq: Four times a day (QID) | ORAL | Status: AC | PRN

## 2012-03-01 MED ORDER — METHOCARBAMOL 100 MG/ML IJ SOLN: 1000.0000 mg | Freq: Once | INTRAMUSCULAR | Status: DC

## 2012-03-01 NOTE — ED Notes (Signed)
Urinal at bedside.  

## 2012-03-01 NOTE — ED Provider Notes (Signed)
History     CSN: [id number]  Arrival date & time 03/01/12  0143   First MD Initiated Contact with Patient 03/01/12 0144      Chief Complaint  Patient presents with  . Chest Pain    (Consider location/radiation/quality/duration/timing/severity/associated sxs/prior treatment) HPI Pt with sharp L-sided CP worse with movement x 3 days. Pt is very poor historian and uncooperative. State he has mild SOB with non-productive cough. No fever, chills, ABd pain, N/V/D. Past Medical History  Diagnosis Date  . Hypertension   . Gout   . Angina pectoris     History reviewed. No pertinent past surgical history.  History reviewed. No pertinent family history.  History  Substance Use Topics  . Smoking status: Not on file  . Smokeless tobacco: Not on file  . Alcohol Use: Not on file      Review of Systems  Constitutional: Negative for fever and chills.  Respiratory: Positive for cough and shortness of breath.   Cardiovascular: Positive for chest pain. Negative for palpitations and leg swelling.  Gastrointestinal: Negative for nausea, vomiting, abdominal pain and diarrhea.  Musculoskeletal: Positive for myalgias and back pain. Negative for joint swelling.  Skin: Negative for rash and wound.  Neurological: Negative for weakness and numbness.    Allergies  Review of patient's allergies indicates no known allergies.  Home Medications   Current Outpatient Rx  Name Route Sig Dispense Refill  . ATENOLOL 100 MG PO TABS Oral Take 100 mg by mouth daily.     . INDOMETHACIN 50 MG PO CAPS Oral Take 1 capsule (50 mg total) by mouth 3 (three) times daily with meals. 90 capsule 0  . METHOCARBAMOL 500 MG PO TABS Oral Take 1 tablet (500 mg total) by mouth 2 (two) times daily. 20 tablet 0  . TRAMADOL HCL 50 MG PO TABS Oral Take 1 tablet (50 mg total) by mouth every 6 (six) hours as needed for pain. 15 tablet 0    BP 167/103  Pulse 92  Temp(Src) 97.9 F (36.6 C) (Oral)  Resp 14  SpO2  98%  Physical Exam  Nursing note and vitals reviewed. Constitutional: He is oriented to person, place, and time. He appears well-developed and well-nourished. No distress.  HENT:  Head: Normocephalic and atraumatic.  Mouth/Throat: Oropharynx is clear and moist.  Eyes: EOM are normal. Pupils are equal, round, and reactive to light.  Neck: Normal range of motion. Neck supple.  Cardiovascular: Normal rate and regular rhythm.   Pulmonary/Chest: Effort normal and breath sounds normal. No respiratory distress. He has no wheezes. He has no rales. He exhibits tenderness (Reproduced chest wall tenderness with palpation of L pectoralis).  Abdominal: Soft. Bowel sounds are normal. There is no tenderness. There is no rebound and no guarding.  Musculoskeletal: Normal range of motion. He exhibits tenderness (TTP over midline lumbar spine, unchanged chronic pain per pt). He exhibits no edema.  Neurological: He is alert and oriented to person, place, and time.       5/5 motor, sensation intact  Skin: Skin is warm and dry. No rash noted. No erythema.  Psychiatric:       Pt with strange uncooperative behavior and at times drowsy    ED Course  Procedures (including critical care time)  Labs Reviewed  CBC - Abnormal; Notable for the following:    RDW 16.7 (*)    All other components within normal limits  COMPREHENSIVE METABOLIC PANEL - Abnormal; Notable for the following:    Glucose,  Bld 126 (*)    Albumin 3.3 (*)    All other components within normal limits  CARDIAC PANEL(CRET KIN+CKTOT+MB+TROPI) - Abnormal; Notable for the following:    Total CK 304 (*)    All other components within normal limits  URINALYSIS, ROUTINE W REFLEX MICROSCOPIC - Abnormal; Notable for the following:    Protein, ur 30 (*)    All other components within normal limits  ETHANOL - Abnormal; Notable for the following:    Alcohol, Ethyl (B) 195 (*)    All other components within normal limits  DIFFERENTIAL  URINE RAPID  DRUG SCREEN (HOSP PERFORMED)  URINE MICROSCOPIC-ADD ON  TROPONIN I   Dg Chest 1 View  03/01/2012  *RADIOLOGY REPORT*  Clinical Data: Left-sided chest pain.  Status seizures.  Altered mental status.  Pain for 3 weeks.  CHEST - 1 VIEW  Comparison: 07/06/2009  Findings: Shallow inspiration.  Normal heart size and pulmonary vascularity.  Vascular crowding due to shallow inspiration.  No focal airspace consolidation in the lungs.  No blunting of costophrenic angles.  No pneumothorax.  No significant change since previous study.  IMPRESSION: No evidence of active pulmonary disease.  Original Report Authenticated By: Marlon Pel, M.D.     1. Chest wall pain   2. Alcohol intoxication      Date: 03/01/2012  Rate: 95  Rhythm: normal sinus rhythm  QRS Axis: normal  Intervals: normal  ST/T Wave abnormalities: nonspecific T wave changes  Conduction Disutrbances:none  Narrative Interpretation:   Old EKG Reviewed: most recent EKG 2010, new t wave inversions noted    MDM  Tech witnessed "siezure" while attempting to get EKG. His partner told him to stop and immediately stopped   Pt resting comfortably. Trop x 2neg. Pain reproduced with palpation. Likelihood for CAD very low.   Loren Racer, MD 03/01/12 [phone]

## 2012-03-01 NOTE — Discharge Instructions (Signed)
Chest Wall Pain Chest wall pain is pain in or around the bones and muscles of your chest. It may take up to 6 weeks to get better. It may take longer if you must stay physically active in your work and activities.  CAUSES  Chest wall pain may happen on its own. However, it may be caused by:  A viral illness like the flu.   Injury.   Coughing.   Exercise.   Arthritis.   Fibromyalgia.   Shingles.  HOME CARE INSTRUCTIONS   Avoid overtiring physical activity. Try not to strain or perform activities that cause pain. This includes any activities using your chest or your abdominal and side muscles, especially if heavy weights are used.   Put ice on the sore area.   Put ice in a plastic bag.   Place a towel between your skin and the bag.   Leave the ice on for 15 to 20 minutes per hour while awake for the first 2 days.   Only take over-the-counter or prescription medicines for pain, discomfort, or fever as directed by your caregiver.  SEEK IMMEDIATE MEDICAL CARE IF:   Your pain increases, or you are very uncomfortable.   You have a fever.   Your chest pain becomes worse.   You have new, unexplained symptoms.   You have nausea or vomiting.   You feel sweaty or lightheaded.   You have a cough with phlegm (sputum), or you cough up blood.  MAKE SURE YOU:   Understand these instructions.   Will watch your condition.   Will get help right away if you are not doing well or get worse.  Document Released: 11/19/2005 Document Revised: 11/08/2011 Document Reviewed: 07/16/2011 [facility] Patient Information 2012 [place], [place].

## 2012-03-01 NOTE — ED Notes (Signed)
Pt c/o lower back pain "for a while now," unable to verbalize exactly when pain began. Pt also w/ left upper chest pain worse w/ movement and tender to palpation, pt w/o n/v or diaphoresis, in no acute distress.. Pt w/ resp even and unlabored. Friend at bedside x1.

## 2012-03-01 NOTE — ED Notes (Signed)
Per EMS - pt from home - pt c/o left-sided chest pain x2 days - pt admits to some shortness of breath - denies any n/v or diaphoresis - pt given x3 0.4mg  SL nitro by EMS, reduced pain periodically. Pt not currently having chest pain. Pt in no acute distress on arrival, smiling and laughing w/ staff.

## 2012-06-14 IMAGING — CR DG KNEE 1-2V*R*
2 series · 2 of 2 positions shown · non-contrast
Comparison: [DATE]

CLINICAL DATA: Chronic bilateral knee pain, swelling

RIGHT KNEE - 1-2 VIEW

[x knee ap right]
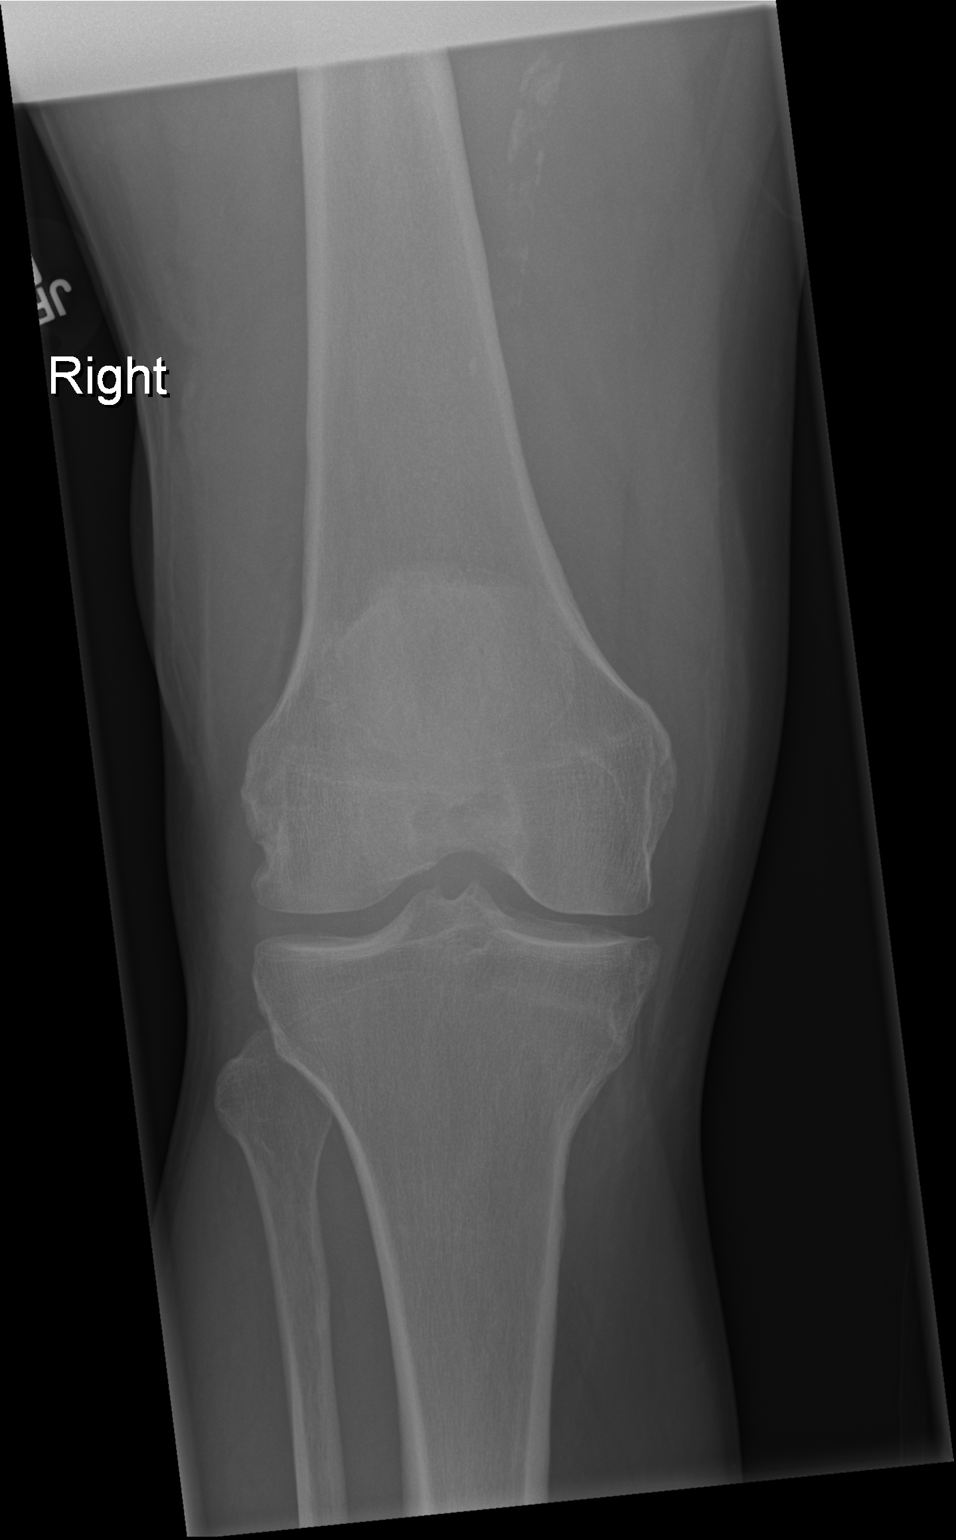

[t knee lat right]
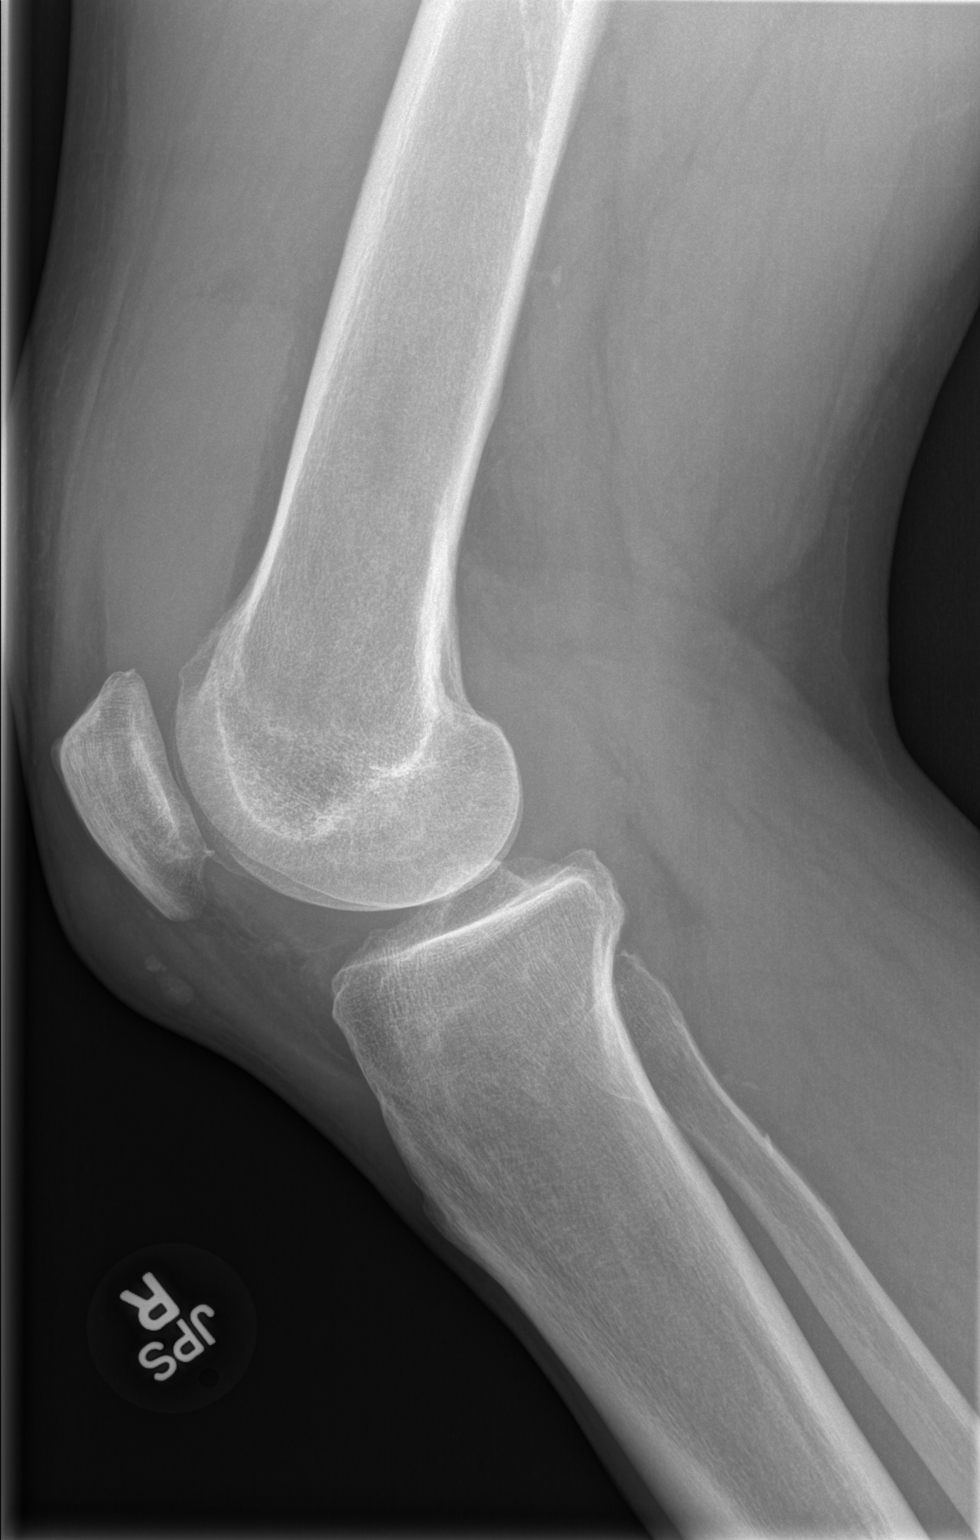

[2 of 2 positions shown; findings below may reference images not displayed]

FINDINGS: No fracture or dislocation is seen.

Mild tricompartmental degenerative changes.

Large total knee joint effusion, increased.

Prepatellar soft tissue swelling.
IMPRESSION: No fracture or dislocation is seen.

Mild degenerative changes.

Large suprapatellar knee joint effusion.

## 2012-07-21 ENCOUNTER — Emergency Department (HOSPITAL_COMMUNITY)
Admission: EM | Admit: 2012-07-21 | Discharge: 2012-07-22 | Disposition: A | Discharge status: Home / Self Care | Payer: Self-pay | Attending: Emergency Medicine | Admitting: Emergency Medicine

## 2012-07-21 DIAGNOSIS — I10 Essential (primary) hypertension: Secondary | ICD-10-CM | POA: Insufficient documentation

## 2012-07-21 DIAGNOSIS — F1010 Alcohol abuse, uncomplicated: Secondary | ICD-10-CM | POA: Insufficient documentation

## 2012-07-21 DIAGNOSIS — R509 Fever, unspecified: Secondary | ICD-10-CM | POA: Insufficient documentation

## 2012-07-21 DIAGNOSIS — Z79899 Other long term (current) drug therapy: Secondary | ICD-10-CM | POA: Insufficient documentation

## 2012-07-21 DIAGNOSIS — R0789 Other chest pain: Secondary | ICD-10-CM | POA: Insufficient documentation

## 2012-07-21 DIAGNOSIS — M109 Gout, unspecified: Secondary | ICD-10-CM | POA: Insufficient documentation

## 2012-07-21 LAB — COMPREHENSIVE METABOLIC PANEL
AST: 22 U/L (ref 0–37)
BUN: 13 mg/dL (ref 6–23)
CO2: 20 mEq/L (ref 19–32)
Calcium: 9.3 mg/dL (ref 8.4–10.5)
Chloride: 102 mEq/L (ref 96–112)
Creatinine, Ser: 1.07 mg/dL (ref 0.50–1.35)
GFR calc Af Amer: >90 mL/min (ref >90)
GFR calc non Af Amer: 79 mL/min — ABNORMAL LOW (ref >90)
Glucose, Bld: 140 mg/dL — ABNORMAL HIGH (ref 70–99)
Total Bilirubin: 0.4 mg/dL (ref 0.3–1.2)

## 2012-07-21 LAB — CBC WITH DIFFERENTIAL/PLATELET
Basophils Absolute: 0.1 10*3/uL (ref 0.0–0.1)
Eosinophils Relative: 1 % (ref 0–5)
HCT: 37.2 % — ABNORMAL LOW (ref 39.0–52.0)
Hemoglobin: 12.6 g/dL — ABNORMAL LOW (ref 13.0–17.0)
Lymphocytes Relative: 19 % (ref 12–46)
MCV: 88.6 fL (ref 78.0–100.0)
Monocytes Absolute: 0.7 10*3/uL (ref 0.1–1.0)
Monocytes Relative: 7 % (ref 3–12)
RDW: 16.4 % — ABNORMAL HIGH (ref 11.5–15.5)
WBC: 10.1 10*3/uL (ref 4.0–10.5)

## 2012-07-21 NOTE — ED Notes (Signed)
[place].  Per EMS, pt has had chest pain 4 hours ago but did not want to come to ED-- pt further proceeded to drink more alcohol.  Presents to ED drowsy but with uncooperative and aggressive behavior.

## 2012-07-21 NOTE — ED Notes (Signed)
AVW:[id number]<XB> Expected date:<BR> Expected time:<BR> Means of arrival:<BR> Comments:<BR> Intoxicated and combative

## 2012-07-22 ENCOUNTER — Emergency Department (HOSPITAL_COMMUNITY): Payer: Self-pay

## 2012-07-22 ENCOUNTER — Encounter (HOSPITAL_COMMUNITY): Payer: Self-pay | Admitting: Emergency Medicine

## 2012-07-22 LAB — GLUCOSE, CAPILLARY: Glucose-Capillary: 162 mg/dL — ABNORMAL HIGH (ref 70–99)

## 2012-07-22 LAB — TROPONIN I: Troponin I: <0.3 ng/mL (ref <0.30)

## 2012-07-22 LAB — RAPID URINE DRUG SCREEN, HOSP PERFORMED
Benzodiazepines: NOT DETECTED
Cocaine: NOT DETECTED
Opiates: NOT DETECTED

## 2012-07-22 LAB — POCT I-STAT, CHEM 8
BUN: 12 mg/dL (ref 6–23)
Calcium, Ion: 1.14 mmol/L (ref 1.12–1.23)
Chloride: 108 mEq/L (ref 96–112)
Creatinine, Ser: 1.1 mg/dL (ref 0.50–1.35)
Glucose, Bld: 113 mg/dL — ABNORMAL HIGH (ref 70–99)
HCT: 35 % — ABNORMAL LOW (ref 39.0–52.0)
Potassium: 3.7 mEq/L (ref 3.5–5.1)

## 2012-07-22 LAB — CK TOTAL AND CKMB (NOT AT ARMC): Total CK: 406 U/L — ABNORMAL HIGH (ref 7–232)

## 2012-07-22 IMAGING — CR DG CHEST 2V
2 series · 2 of 2 positions shown · non-contrast
Comparison: [DATE]

CLINICAL DATA: Fever.  Chest pain.

CHEST - 2 VIEW

[w chest lat]
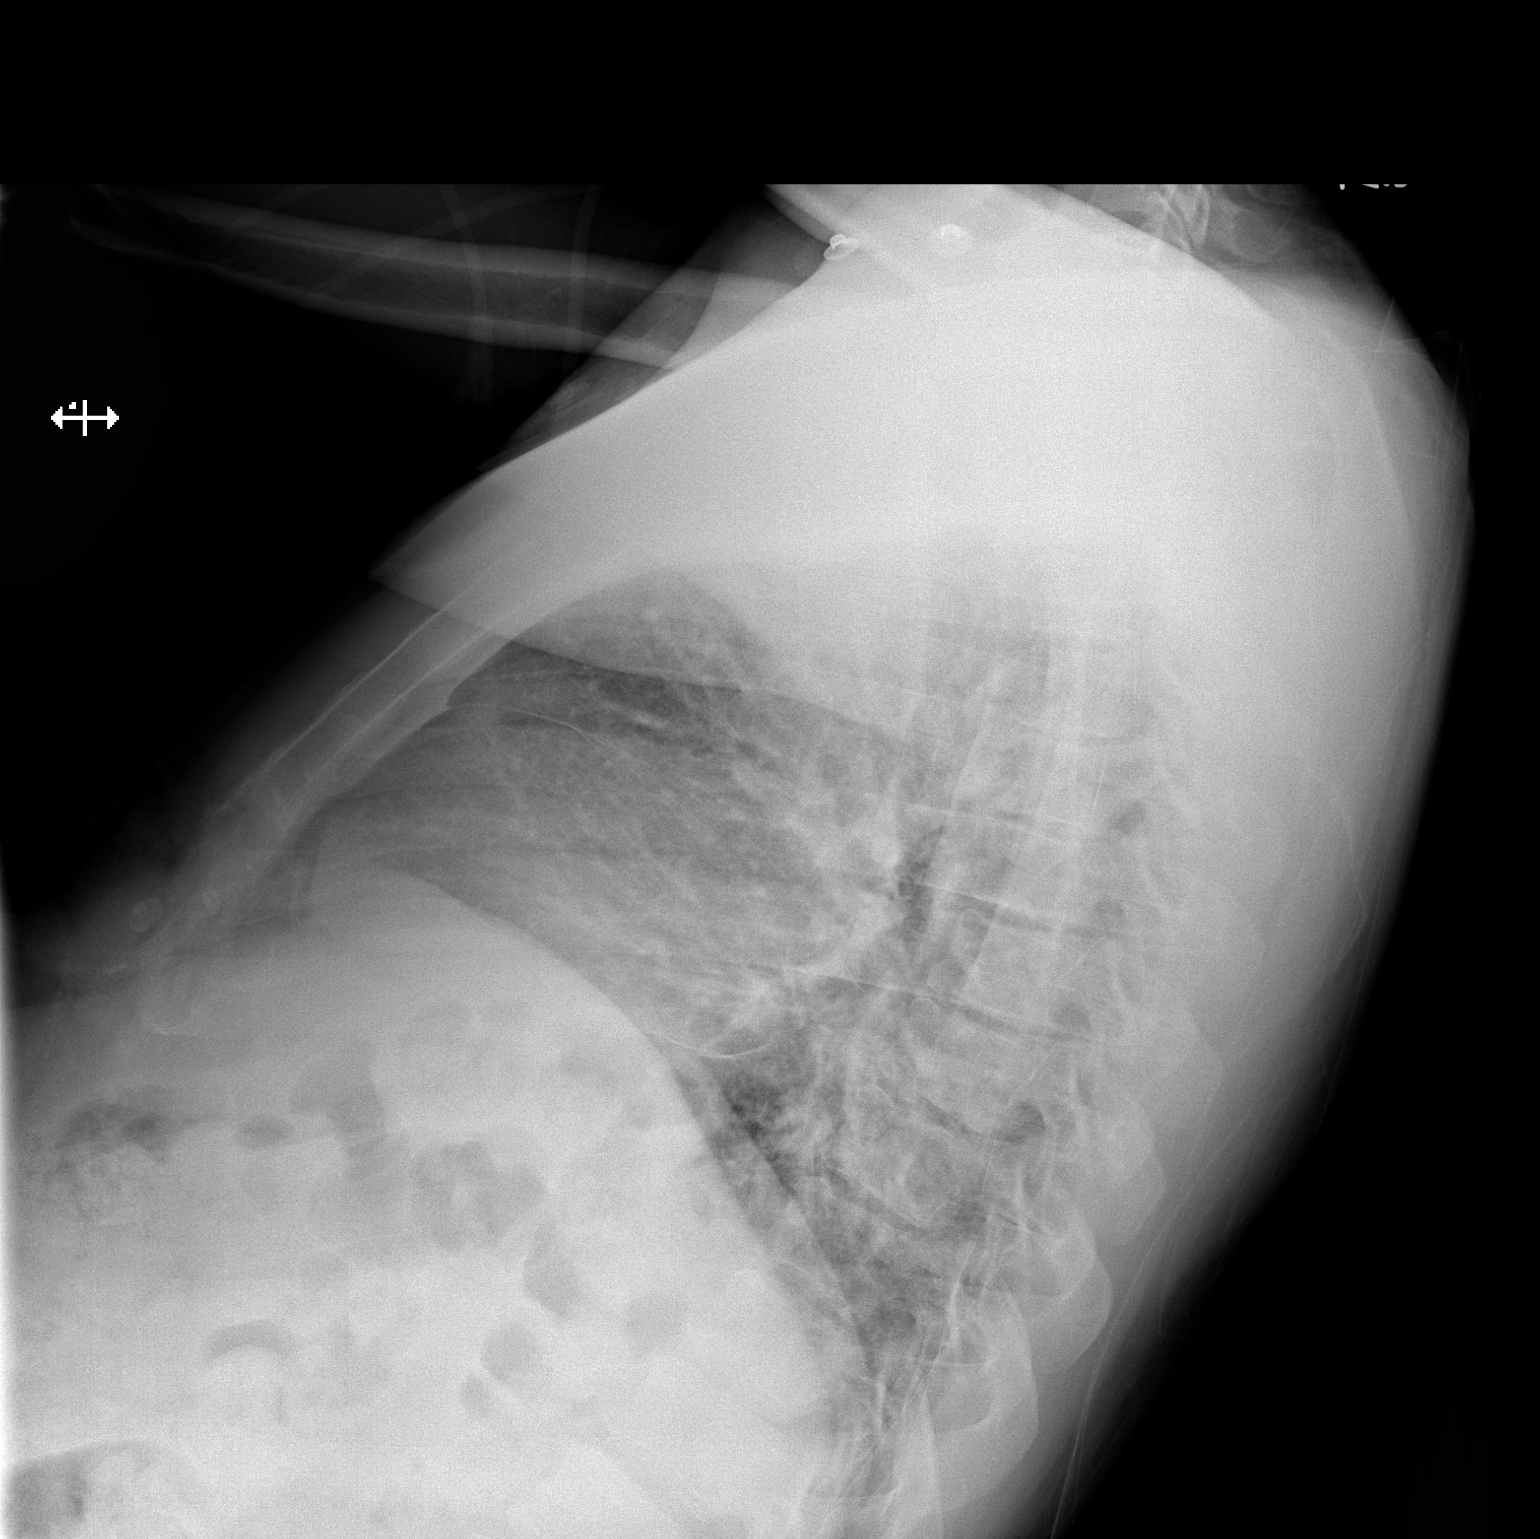

[x chest ap]
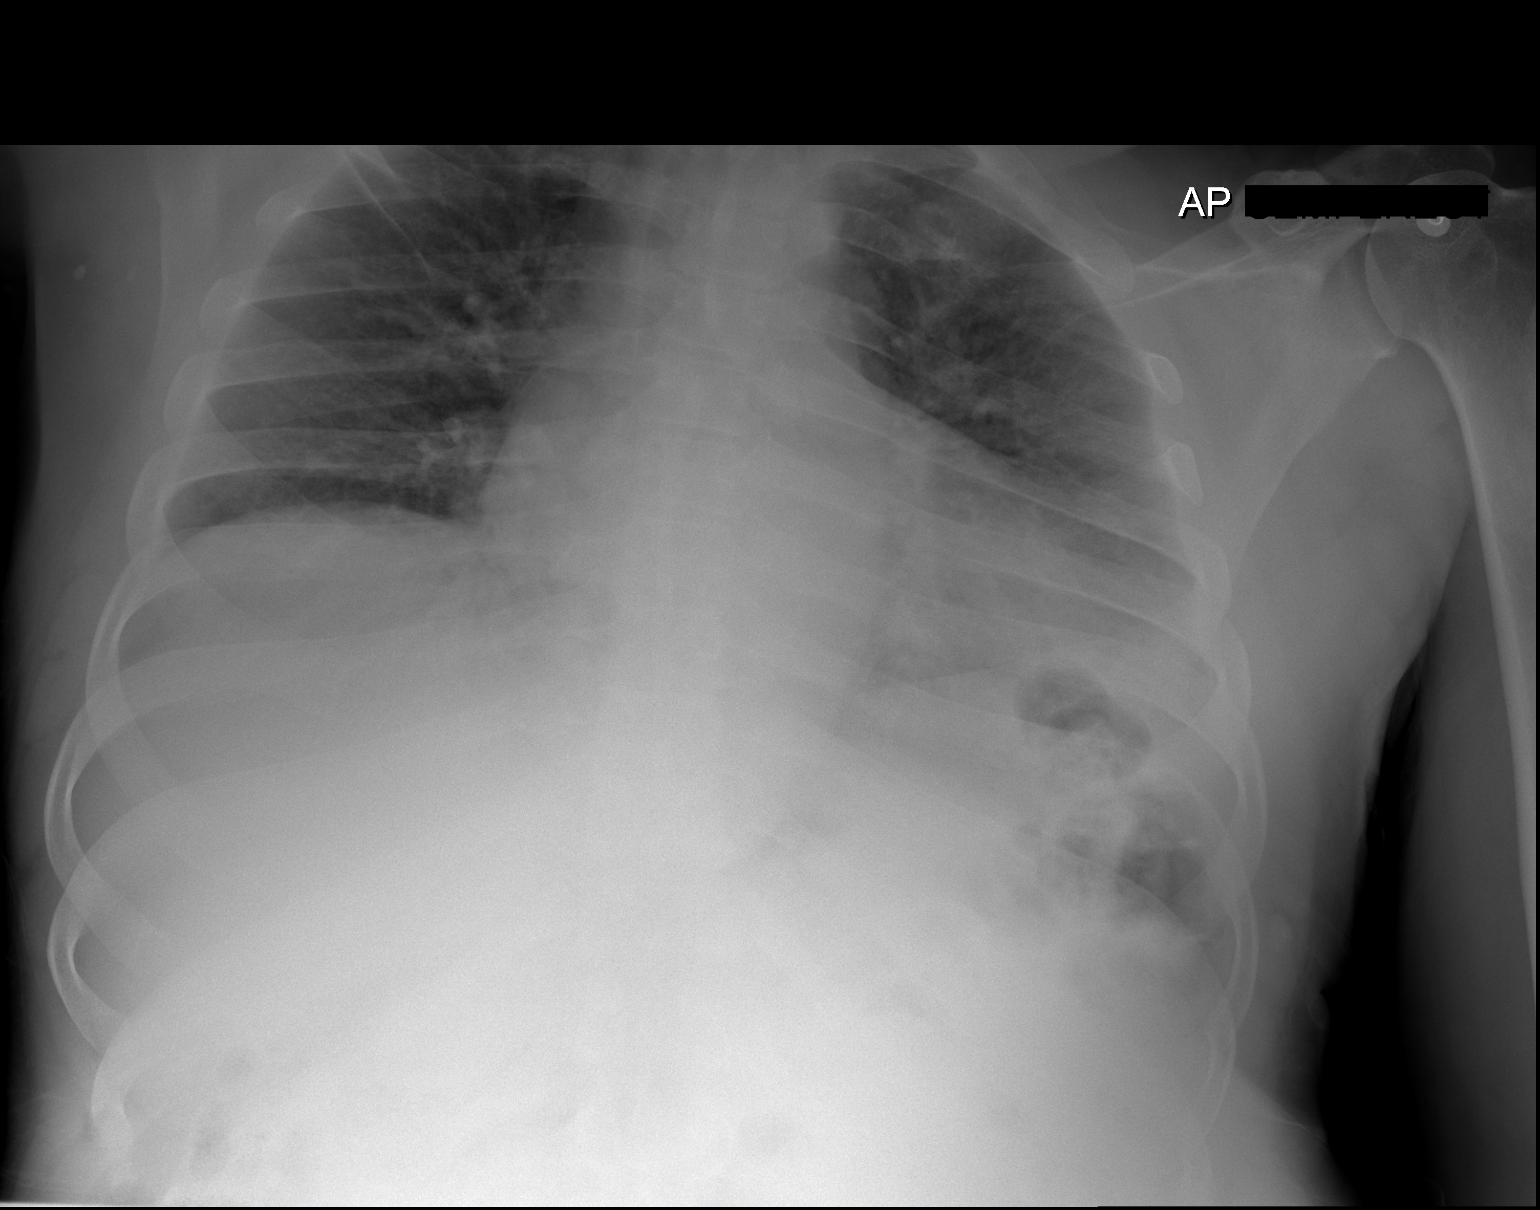

[2 of 2 positions shown; findings below may reference images not displayed]

FINDINGS: Shallow inspiration.  Normal heart size and pulmonary
vascularity for technique.  Increased opacity in the left chest is
probably due to soft tissue attenuation.  No definite airspace
consolidation or edema.  No blunting of costophrenic angles.  No
pneumothorax.  Tortuous aorta.  No significant change since
previous study, allowing for differences in technique.
IMPRESSION: No evidence of active pulmonary disease.

## 2012-07-22 MED ORDER — INDOMETHACIN 25 MG PO CAPS: 25.0000 mg | ORAL_CAPSULE | Freq: Three times a day (TID) | ORAL | Status: AC | PRN

## 2012-07-22 MED ORDER — INDOMETHACIN 50 MG PO CAPS
50.0000 mg | ORAL_CAPSULE | Freq: Once | ORAL | Status: AC
  Administered 2012-07-22: 50 mg via ORAL
  Filled 2012-07-22: qty 1

## 2012-07-22 MED ORDER — PREDNISONE 20 MG PO TABS: 60.0000 mg | ORAL_TABLET | Freq: Every day | ORAL | Status: AC

## 2012-07-22 MED ORDER — OXYCODONE-ACETAMINOPHEN 5-325 MG PO TABS
1.0000 | ORAL_TABLET | Freq: Once | ORAL | Status: AC
  Administered 2012-07-22: 1 via ORAL
  Filled 2012-07-22: qty 1

## 2012-07-22 MED ORDER — GI COCKTAIL ~~LOC~~
30.0000 mL | Freq: Once | ORAL | Status: AC
  Administered 2012-07-22: 30 mL via ORAL
  Filled 2012-07-22: qty 30

## 2012-07-22 MED ORDER — SODIUM CHLORIDE 0.9 % IV SOLN
Freq: Once | INTRAVENOUS | Status: AC
  Administered 2012-07-22: 02:00:00 via INTRAVENOUS

## 2012-07-22 MED ORDER — PREDNISONE 20 MG PO TABS
60.0000 mg | ORAL_TABLET | Freq: Once | ORAL | Status: AC
  Administered 2012-07-22: 60 mg via ORAL
  Filled 2012-07-22: qty 3

## 2012-07-22 MED ORDER — OXYCODONE-ACETAMINOPHEN 5-325 MG PO TABS: 2.0000 | ORAL_TABLET | ORAL | Status: AC | PRN

## 2012-07-22 NOTE — ED Provider Notes (Signed)
History     CSN: [id number]  Arrival date & time 07/21/12  2305   First MD Initiated Contact with Patient 07/22/12 0140      Chief Complaint  Patient presents with  . Alcohol Intoxication  . Chest Pain  . Aggressive Behavior    (Consider location/radiation/quality/duration/timing/severity/associated sxs/prior treatment) HPI 51 year old male presents to emergency department via EMS with complaint of chest pressure ongoing for the last week, left knee and ankle pain. Patient is highly intoxicated, is agitated and will not answer questions. Patient reports he's been drinking to take away the pain in his leg and chest. Patient has history of angina, hypertension and gout. He denies any shortness of breath, no sweating no vomiting. He denies history of pancreatitis.  Past Medical History  Diagnosis Date  . Hypertension   . Gout   . Angina pectoris     History reviewed. No pertinent past surgical history.  History reviewed. No pertinent family history.  History  Substance Use Topics  . Smoking status: Not on file  . Smokeless tobacco: Not on file  . Alcohol Use: Yes      Review of Systems  Unable to perform ROS: Mental status change   intoxicated  Allergies  Review of patient's allergies indicates no known allergies.  Home Medications   Current Outpatient Rx  Name Route Sig Dispense Refill  . ATENOLOL 100 MG PO TABS Oral Take 100 mg by mouth daily.     . INDOMETHACIN 25 MG PO CAPS Oral Take 25 mg by mouth 2 (two) times daily with a meal.    . INDOMETHACIN 25 MG PO CAPS Oral Take 1 capsule (25 mg total) by mouth 3 (three) times daily as needed. 30 capsule 0  . OXYCODONE-ACETAMINOPHEN 5-325 MG PO TABS Oral Take 2 tablets by mouth every 4 (four) hours as needed for pain. 20 tablet 0  . PREDNISONE 20 MG PO TABS Oral Take 3 tablets (60 mg total) by mouth daily. 15 tablet 0    BP 173/107  Pulse 94  Temp 98.3 F (36.8 C) (Oral)  Resp 16  SpO2 95%  Physical Exam    Nursing note and vitals reviewed. Constitutional: He appears well-developed and well-nourished. He appears distressed.  HENT:  Head: Normocephalic and atraumatic.  Nose: Nose normal.  Mouth/Throat: Oropharynx is clear and moist.  Eyes: Conjunctivae and EOM are normal. Pupils are equal, round, and reactive to light.  Neck: Normal range of motion. Neck supple. No JVD present. No tracheal deviation present. No thyromegaly present.  Cardiovascular: Normal rate, regular rhythm, normal heart sounds and intact distal pulses.  Exam reveals no gallop and no friction rub.   No murmur heard. Pulmonary/Chest: Effort normal and breath sounds normal. No stridor. No respiratory distress. He has no wheezes. He has no rales. He exhibits no tenderness.  Abdominal: Soft. Bowel sounds are normal. He exhibits no distension and no mass. There is no tenderness. There is no rebound and no guarding.  Musculoskeletal: Normal range of motion. He exhibits tenderness (tenderness with palpation of left knee and left ankle. Normal range of motion some limitation secondary to pain.). He exhibits no edema (no effusion noted in areas of pain of left knee and ankle).  Lymphadenopathy:    He has no cervical adenopathy.  Neurological: He exhibits normal muscle tone. Coordination (patient with ataxia) abnormal.  Skin: Skin is warm and dry. No rash noted. He is not diaphoretic. No erythema. No pallor.  Psychiatric:  Patient is belligerent, intoxicated     ED Course  Procedures (including critical care time)  Labs Reviewed  CK TOTAL AND CKMB - Abnormal; Notable for the following:    Total CK 406 (*)     All other components within normal limits  ETHANOL - Abnormal; Notable for the following:    Alcohol, Ethyl (B) 198 (*)     All other components within normal limits  CBC WITH DIFFERENTIAL - Abnormal; Notable for the following:    RBC 4.20 (*)     Hemoglobin 12.6 (*)     HCT 37.2 (*)     RDW 16.4 (*)     All other  components within normal limits  COMPREHENSIVE METABOLIC PANEL - Abnormal; Notable for the following:    Glucose, Bld 140 (*)     Albumin 3.4 (*)     GFR calc non Af Amer 79 (*)     All other components within normal limits  POCT I-STAT, CHEM 8 - Abnormal; Notable for the following:    Glucose, Bld 113 (*)     Hemoglobin 11.9 (*)     HCT 35.0 (*)     All other components within normal limits  GLUCOSE, CAPILLARY - Abnormal; Notable for the following:    Glucose-Capillary 162 (*)     All other components within normal limits  TROPONIN I  URINE RAPID DRUG SCREEN (HOSP PERFORMED)  LIPASE, BLOOD   Dg Chest 2 View  07/22/2012  *RADIOLOGY REPORT*  Clinical Data: Fever.  Chest pain.  CHEST - 2 VIEW  Comparison: 03/01/2012  Findings: Shallow inspiration.  Normal heart size and pulmonary vascularity for technique.  Increased opacity in the left chest is probably due to soft tissue attenuation.  No definite airspace consolidation or edema.  No blunting of costophrenic angles.  No pneumothorax.  Tortuous aorta.  No significant change since previous study, allowing for differences in technique.  IMPRESSION: No evidence of active pulmonary disease.   Original Report Authenticated By: Marlon Pel, M.D.     Date: 07/22/2012  Rate: 102  Rhythm: sinus tachycardia  QRS Axis: normal  Intervals: normal  ST/T Wave abnormalities: nonspecific ST/T changes and early repolarization  Conduction Disutrbances:none  Narrative Interpretation:   Old EKG Reviewed: changes noted    1. Gout attack   2. Alcohol abuse   3. Atypical chest pain       MDM  51 year old male who initially presented agitated and intoxicated complaining of chest and left leg pain. Once patient sobered, he denies any chest pain. He reports that he has been having some burning in his chest over the last several days. He's been drinking heavily to help with the gout flare in his left knee and ankle. Patient reports improvement in  pain after Percocet. We'll send home on Indocin, Percocet and prednisone. Patient given crutches for ambulation due to pain        Olivia Mackie, MD 07/23/12 [phone]

## 2012-07-22 NOTE — ED Notes (Signed)
Patient was unable to ambulate with his cane, due to pain and swelling in the left knee. He stated that he could not put weight on that leg at all. Patient become dizzy and started to sweat.

## 2012-09-10 ENCOUNTER — Emergency Department (HOSPITAL_COMMUNITY)
Admission: EM | Admit: 2012-09-10 | Discharge: 2012-09-10 | Disposition: A | Discharge status: Home / Self Care | Payer: Self-pay | Attending: Emergency Medicine | Admitting: Emergency Medicine

## 2012-09-10 ENCOUNTER — Emergency Department (HOSPITAL_COMMUNITY): Payer: Self-pay

## 2012-09-10 DIAGNOSIS — M109 Gout, unspecified: Secondary | ICD-10-CM | POA: Insufficient documentation

## 2012-09-10 DIAGNOSIS — R0602 Shortness of breath: Secondary | ICD-10-CM | POA: Insufficient documentation

## 2012-09-10 DIAGNOSIS — I10 Essential (primary) hypertension: Secondary | ICD-10-CM | POA: Insufficient documentation

## 2012-09-10 DIAGNOSIS — R079 Chest pain, unspecified: Secondary | ICD-10-CM | POA: Insufficient documentation

## 2012-09-10 LAB — CBC WITH DIFFERENTIAL/PLATELET
Basophils Absolute: 0 10*3/uL (ref 0.0–0.1)
Basophils Relative: 0 % (ref 0–1)
Eosinophils Absolute: 0.1 10*3/uL (ref 0.0–0.7)
Eosinophils Relative: 1 % (ref 0–5)
HCT: 35.9 % — ABNORMAL LOW (ref 39.0–52.0)
MCH: 28.6 pg (ref 26.0–34.0)
MCHC: 33.4 g/dL (ref 30.0–36.0)
MCV: 85.5 fL (ref 78.0–100.0)
Monocytes Absolute: 0.9 10*3/uL (ref 0.1–1.0)
Monocytes Relative: 10 % (ref 3–12)
RDW: 14.5 % (ref 11.5–15.5)

## 2012-09-10 LAB — ETHANOL: Alcohol, Ethyl (B): <11 mg/dL (ref 0–11)

## 2012-09-10 LAB — BASIC METABOLIC PANEL
BUN: 14 mg/dL (ref 6–23)
Calcium: 9.7 mg/dL (ref 8.4–10.5)
Creatinine, Ser: 0.99 mg/dL (ref 0.50–1.35)
GFR calc Af Amer: >90 mL/min (ref >90)

## 2012-09-10 LAB — TROPONIN I: Troponin I: <0.3 ng/mL (ref <0.30)

## 2012-09-10 IMAGING — CR DG CHEST 2V
2 series · 2 of 2 positions shown · non-contrast
Comparison: [DATE] and earlier.

CLINICAL DATA: 51-year-old male shortness of breath and left side
chest pain.

CHEST - 2 VIEW

[w chest lat]
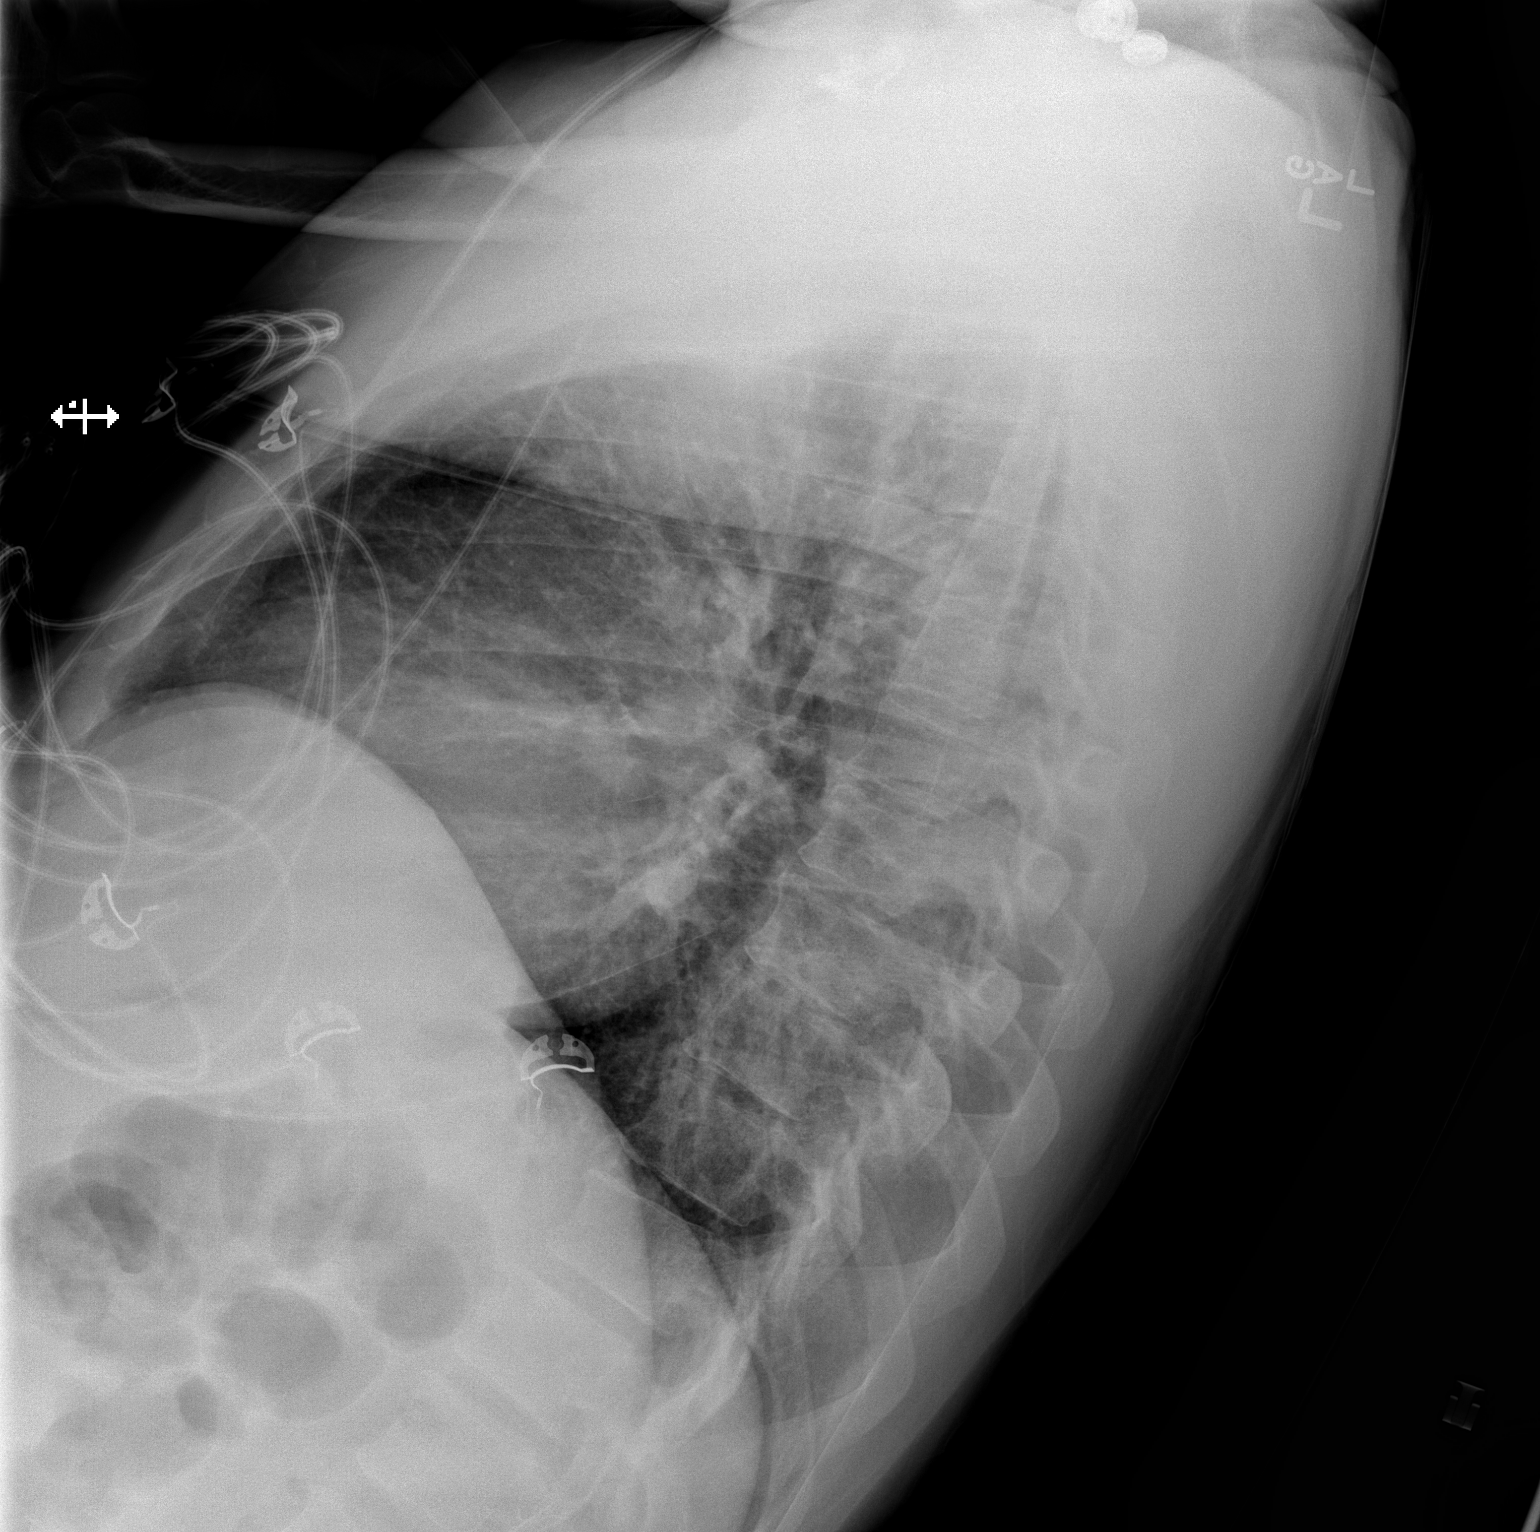

[x chest ap]
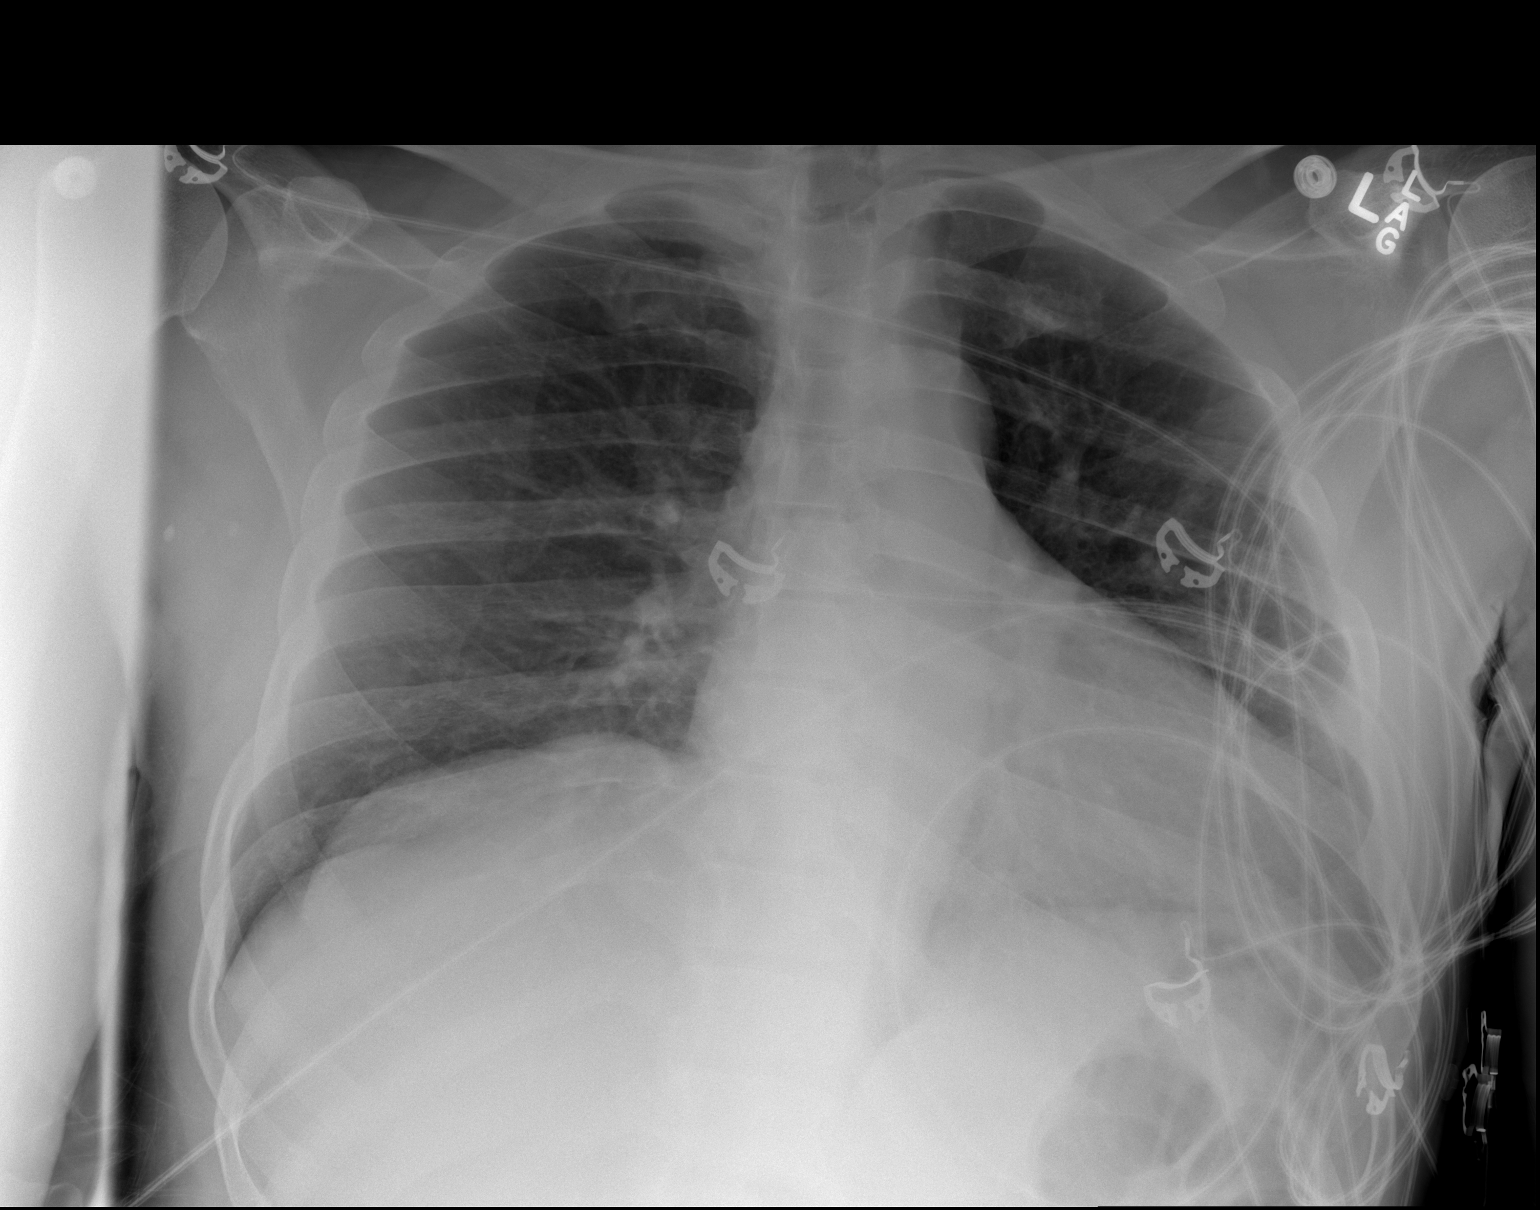

[2 of 2 positions shown; findings below may reference images not displayed]

FINDINGS: Upright AP and lateral views of the chest.  Chronic mild
cardiomegaly. Other mediastinal contours are within normal limits.
Chronic somewhat low lung volumes.  No pneumothorax or pulmonary
edema.  Chronic retrocardiac hypoventilation.  No pleural effusion
or confluent pulmonary opacity. No acute osseous abnormality
identified.
IMPRESSION: No acute cardiopulmonary abnormality.

## 2012-09-10 MED ORDER — OXYCODONE-ACETAMINOPHEN 5-325 MG PO TABS
1.0000 | ORAL_TABLET | Freq: Once | ORAL | Status: AC
  Administered 2012-09-10: 1 via ORAL
  Filled 2012-09-10 (×2): qty 1

## 2012-09-10 MED ORDER — INDOMETHACIN 25 MG PO CAPS
25.0000 mg | ORAL_CAPSULE | Freq: Once | ORAL | Status: AC
  Administered 2012-09-10: 25 mg via ORAL
  Filled 2012-09-10: qty 1

## 2012-09-10 MED ORDER — PREDNISONE 20 MG PO TABS: 40.0000 mg | ORAL_TABLET | Freq: Every day | ORAL | Status: DC

## 2012-09-10 MED ORDER — PREDNISONE 20 MG PO TABS
40.0000 mg | ORAL_TABLET | Freq: Once | ORAL | Status: AC
  Administered 2012-09-10: 40 mg via ORAL
  Filled 2012-09-10: qty 2

## 2012-09-10 MED ORDER — OXYCODONE-ACETAMINOPHEN 5-325 MG PO TABS
1.0000 | ORAL_TABLET | Freq: Once | ORAL | Status: AC
  Administered 2012-09-10: 1 via ORAL
  Filled 2012-09-10: qty 1

## 2012-09-10 MED ORDER — OXYCODONE-ACETAMINOPHEN 5-325 MG PO TABS: 1.0000 | ORAL_TABLET | ORAL | Status: DC | PRN

## 2012-09-10 MED ORDER — INDOMETHACIN 25 MG PO CAPS: 25.0000 mg | ORAL_CAPSULE | Freq: Three times a day (TID) | ORAL | Status: DC

## 2012-09-10 NOTE — ED Notes (Signed)
Discharge instructions reviewed. Pt verbalized understanding.  

## 2012-09-10 NOTE — ED Provider Notes (Signed)
Medical screening examination/treatment/procedure(s) were performed by non-physician practitioner and as supervising physician I was immediately available for consultation/collaboration.   Richardean Canal, MD 09/10/12 [phone]

## 2012-09-10 NOTE — ED Provider Notes (Signed)
History     CSN: [id number]  Arrival date & time 09/10/12  0102   First MD Initiated Contact with Patient 09/10/12 0111      Chief Complaint  Patient presents with  . Joint Pain    (Consider location/radiation/quality/duration/timing/severity/associated sxs/prior treatment) The history is provided by the patient and medical records.    Darryl Hurley is a 51 y.o. male presents to the emergency department complaining of joint pain .  The onset of the symptoms was gradual starting 4 weeks ago.  The patient has associated chest pain which began today.  The symptoms have been  persistent, gradually worsened.  nothing makes the symptoms worse and nothing makes symptoms better.  The patient denies fever, chills, neck pain, headache, abdominal pain, nausea, vomiting, diarrhea, constipation, weakness, dizziness or syncope.  Pt states his chest pain began today and he describes it as burning and pressure. He has a Hx of gout, angina and HTN.  Pt denies Hx of MI.  Pt is able to answer all questions, but intermittently uncooperative with questioning simply stating that he hurts and needs pain medication.  Pt with Hx of EtOH abuse, when questioned he states he "took a sip" of the alcohol to "help my pain."   Past Medical History  Diagnosis Date  . Hypertension   . Gout   . Angina pectoris     No past surgical history on file.  No family history on file.  History  Substance Use Topics  . Smoking status: Not on file  . Smokeless tobacco: Not on file  . Alcohol Use: Yes      Review of Systems  Constitutional: Negative for fever, diaphoresis, appetite change, fatigue and unexpected weight change.  HENT: Negative for mouth sores and neck stiffness.   Eyes: Negative for visual disturbance.  Respiratory: Negative for cough, chest tightness, shortness of breath and wheezing.   Cardiovascular: Positive for chest pain.  Gastrointestinal: Negative for nausea, vomiting, abdominal pain,  diarrhea and constipation.  Genitourinary: Negative for dysuria, urgency, frequency and hematuria.  Musculoskeletal: Positive for arthralgias.  Skin: Negative for rash.  Neurological: Negative for syncope, light-headedness and headaches.  Psychiatric/Behavioral: Negative for disturbed wake/sleep cycle. The patient is not nervous/anxious.     Allergies  Review of patient's allergies indicates no known allergies.  Home Medications   Current Outpatient Rx  Name Route Sig Dispense Refill  . ATENOLOL 100 MG PO TABS Oral Take 100 mg by mouth daily.     . INDOMETHACIN 25 MG PO CAPS Oral Take 1 capsule (25 mg total) by mouth 3 (three) times daily as needed. 30 capsule 0    BP 168/116  Pulse 91  Temp 98.2 F (36.8 C) (Oral)  Resp 24  SpO2 98%  Physical Exam  Nursing note and vitals reviewed. Constitutional: He is oriented to person, place, and time. He appears well-developed and well-nourished. No distress.  HENT:  Head: Normocephalic and atraumatic.  Mouth/Throat: Oropharynx is clear and moist. No oropharyngeal exudate.  Eyes: Conjunctivae normal are normal. No scleral icterus.  Neck: Normal range of motion. Neck supple.  Cardiovascular: Normal rate, regular rhythm and intact distal pulses.   Pulmonary/Chest: Effort normal. No respiratory distress. He has no decreased breath sounds. He has no wheezes. He has rhonchi (coarse rhonchi throughout). He has no rales. He exhibits tenderness (sternum). He exhibits no deformity, no swelling and no retraction.  Abdominal: Soft. Bowel sounds are normal. He exhibits no mass. There is no tenderness. There is  no rebound and no guarding.  Musculoskeletal: Normal range of motion. He exhibits no edema.       TTP of left knee, left ankle, bilateral wrists.  ROM: mildly limited 2/2 pain  Neurological: He is alert and oriented to person, place, and time. No cranial nerve deficit. He exhibits normal muscle tone. Coordination normal.       Speech is clear  and goal oriented Strength 4/5, decreased 2/2 pain Sensation normal to light touch.    Skin: Skin is warm and dry. No rash noted. He is not diaphoretic. There is erythema (L ankle, L knee - warm to touch).  Psychiatric: He has a normal mood and affect.    ED Course  Procedures (including critical care time)  Labs Reviewed  CBC WITH DIFFERENTIAL - Abnormal; Notable for the following:    RBC 4.20 (*)     Hemoglobin 12.0 (*)     HCT 35.9 (*)     Platelets 426 (*)     All other components within normal limits  BASIC METABOLIC PANEL - Abnormal; Notable for the following:    Glucose, Bld 136 (*)     All other components within normal limits  GLUCOSE, CAPILLARY - Abnormal; Notable for the following:    Glucose-Capillary 144 (*)     All other components within normal limits  TROPONIN I  ETHANOL  TROPONIN I   Dg Chest 2 View  09/10/2012  *RADIOLOGY REPORT*  Clinical Data: 51 year old male shortness of breath and left side chest pain.  CHEST - 2 VIEW  Comparison: 07/22/2012 and earlier.  Findings: Upright AP and lateral views of the chest.  Chronic mild cardiomegaly. Other mediastinal contours are within normal limits. Chronic somewhat low lung volumes.  No pneumothorax or pulmonary edema.  Chronic retrocardiac hypoventilation.  No pleural effusion or confluent pulmonary opacity. No acute osseous abnormality identified.  IMPRESSION: No acute cardiopulmonary abnormality.   Original Report Authenticated By: Harley Hallmark, M.D.     Results for orders placed during the hospital encounter of 09/10/12  TROPONIN I      Component Value Range   Troponin I <0.30  <0.30 ng/mL  CBC WITH DIFFERENTIAL      Component Value Range   WBC 9.1  4.0 - 10.5 K/uL   RBC 4.20 (*) 4.22 - 5.81 MIL/uL   Hemoglobin 12.0 (*) 13.0 - 17.0 g/dL   HCT 45.4 (*) 09.8 - 11.9 %   MCV 85.5  78.0 - 100.0 fL   MCH 28.6  26.0 - 34.0 pg   MCHC 33.4  30.0 - 36.0 g/dL   RDW 14.7  82.9 - 56.2 %   Platelets 426 (*) 150 - 400  K/uL   Neutrophils Relative 69  43 - 77 %   Neutro Abs 6.3  1.7 - 7.7 K/uL   Lymphocytes Relative 20  12 - 46 %   Lymphs Abs 1.8  0.7 - 4.0 K/uL   Monocytes Relative 10  3 - 12 %   Monocytes Absolute 0.9  0.1 - 1.0 K/uL   Eosinophils Relative 1  0 - 5 %   Eosinophils Absolute 0.1  0.0 - 0.7 K/uL   Basophils Relative 0  0 - 1 %   Basophils Absolute 0.0  0.0 - 0.1 K/uL  BASIC METABOLIC PANEL      Component Value Range   Sodium 136  135 - 145 mEq/L   Potassium 3.5  3.5 - 5.1 mEq/L   Chloride 99  96 -  112 mEq/L   CO2 24  19 - 32 mEq/L   Glucose, Bld 136 (*) 70 - 99 mg/dL   BUN 14  6 - 23 mg/dL   Creatinine, Ser 4.01  0.50 - 1.35 mg/dL   Calcium 9.7  8.4 - 02.7 mg/dL   GFR calc non Af Amer >90  >90 mL/min   GFR calc Af Amer >90  >90 mL/min  ETHANOL      Component Value Range   Alcohol, Ethyl (B) <11  0 - 11 mg/dL  GLUCOSE, CAPILLARY      Component Value Range   Glucose-Capillary 144 (*) 70 - 99 mg/dL   Dg Chest 2 View  25/02/6643  *RADIOLOGY REPORT*  Clinical Data: 51 year old male shortness of breath and left side chest pain.  CHEST - 2 VIEW  Comparison: 07/22/2012 and earlier.  Findings: Upright AP and lateral views of the chest.  Chronic mild cardiomegaly. Other mediastinal contours are within normal limits. Chronic somewhat low lung volumes.  No pneumothorax or pulmonary edema.  Chronic retrocardiac hypoventilation.  No pleural effusion or confluent pulmonary opacity. No acute osseous abnormality identified.  IMPRESSION: No acute cardiopulmonary abnormality.   Original Report Authenticated By: Harley Hallmark, M.D.    ECG:  Date: 09/10/2012  Rate: 90  Rhythm: normal sinus rhythm  QRS Axis: normal  Intervals: normal  ST/T Wave abnormalities: normal  Conduction Disutrbances:none  Narrative Interpretation: NSR, non-ischemic ECG  Old EKG Reviewed: unchanged    1. Gout   2. Chest pain   3. Essential hypertension, benign       MDM  [name] presents with c/o CP,  joint pain.  CBC with mild anemia, troponin negative, ethanol level less than 11, BMP unremarkable.  Chest x-ray without evidence of pneumonia or pulmonary edema. Nonischemic ECG.  Pt with warmth, swelling and erythema of the L ankle and knee.  No new injury, doubt infection as pt is afebrile, non-tachycardiac, non-toxic and nonseptic appearing.  Will treat with again with indocin, percocet and prednisone; this seems to have treated the pts symptoms well in the past.  Patient states he feels much better; chest pain completely resolved.   Chest pain is not likely of cardiac or pulmonary etiology d/t presentation, perc negative, VSS, no tracheal deviation, no JVD or new murmur, RRR, breath sounds equal bilaterally, EKG without acute abnormalities, negative troponin, and negative CXR. Pt has been advised start a PPI and return to the ED is CP becomes exertional, associated with diaphoresis or nausea, radiates to left jaw/arm, worsens or becomes concerning in any way. Pending delta troponin; if neg pt may be discharged home.  Discussed patient with Fayrene Helper PA-C and he will follow.    Case has been discussed with and seen by Dr. Chaney Malling who agrees with the above plan to discharge.   1. Medications: indocin, percocet, prednisone 2. Treatment: rest, drink plenty of fluids, gentle stretching, Prilosec over the counter 3. Follow Up: with PCP or in the ED if symptoms worsen or you develop a fever.           Dahlia Client Tennelle Taflinger, PA-C 09/10/12 [phone]

## 2012-09-10 NOTE — ED Notes (Signed)
As per EMS, has HX of gout and has uncontrolled pain. Pt has not been able to get Rx for gout and pain control.[email or web address].pwd

## 2012-09-10 NOTE — ED Provider Notes (Signed)
Assumed care from [facility].  Pt has pain consistent with gout.  Awaits last troponin, prior to d/c.    7:11 AM Delta troponin is normal.  Pain is well controlled.  Discharged pt filled.  Will d/c.    Fayrene Helper, PA-C 09/10/12 [phone]

## 2012-09-10 NOTE — ED Notes (Signed)
Bed:WA12<BR> Expected date:<BR> Expected time:<BR> Means of arrival:<BR> Comments:<BR> EMS

## 2012-09-10 NOTE — ED Provider Notes (Signed)
Medical screening examination/treatment/procedure(s) were performed by non-physician practitioner and as supervising physician I was immediately available for consultation/collaboration.   Richardean Canal, MD 09/10/12 [phone]

## 2012-12-29 ENCOUNTER — Encounter (HOSPITAL_COMMUNITY): Payer: Self-pay

## 2012-12-29 ENCOUNTER — Emergency Department (HOSPITAL_COMMUNITY): Payer: Medicaid Other

## 2012-12-29 ENCOUNTER — Emergency Department (HOSPITAL_COMMUNITY)
Admission: EM | Admit: 2012-12-29 | Discharge: 2012-12-29 | Disposition: A | Discharge status: Home / Self Care | Payer: Medicaid Other | Attending: Emergency Medicine | Admitting: Emergency Medicine

## 2012-12-29 ENCOUNTER — Emergency Department (HOSPITAL_COMMUNITY)
Admission: EM | Admit: 2012-12-29 | Discharge: 2012-12-30 | Disposition: A | Discharge status: Home / Self Care | Payer: Medicaid Other | Attending: Emergency Medicine | Admitting: Emergency Medicine

## 2012-12-29 DIAGNOSIS — Z9119 Patient's noncompliance with other medical treatment and regimen: Secondary | ICD-10-CM | POA: Insufficient documentation

## 2012-12-29 DIAGNOSIS — F17200 Nicotine dependence, unspecified, uncomplicated: Secondary | ICD-10-CM | POA: Insufficient documentation

## 2012-12-29 DIAGNOSIS — M109 Gout, unspecified: Secondary | ICD-10-CM | POA: Insufficient documentation

## 2012-12-29 DIAGNOSIS — J40 Bronchitis, not specified as acute or chronic: Secondary | ICD-10-CM

## 2012-12-29 DIAGNOSIS — I10 Essential (primary) hypertension: Secondary | ICD-10-CM | POA: Insufficient documentation

## 2012-12-29 DIAGNOSIS — J449 Chronic obstructive pulmonary disease, unspecified: Secondary | ICD-10-CM | POA: Insufficient documentation

## 2012-12-29 DIAGNOSIS — J4489 Other specified chronic obstructive pulmonary disease: Secondary | ICD-10-CM | POA: Insufficient documentation

## 2012-12-29 DIAGNOSIS — Z720 Tobacco use: Secondary | ICD-10-CM

## 2012-12-29 DIAGNOSIS — F1010 Alcohol abuse, uncomplicated: Secondary | ICD-10-CM | POA: Insufficient documentation

## 2012-12-29 DIAGNOSIS — F10929 Alcohol use, unspecified with intoxication, unspecified: Secondary | ICD-10-CM

## 2012-12-29 DIAGNOSIS — Z8679 Personal history of other diseases of the circulatory system: Secondary | ICD-10-CM | POA: Insufficient documentation

## 2012-12-29 DIAGNOSIS — Z91199 Patient's noncompliance with other medical treatment and regimen due to unspecified reason: Secondary | ICD-10-CM | POA: Insufficient documentation

## 2012-12-29 DIAGNOSIS — Z9114 Patient's other noncompliance with medication regimen: Secondary | ICD-10-CM

## 2012-12-29 LAB — POCT I-STAT, CHEM 8
BUN: 11 mg/dL (ref 6–23)
Calcium, Ion: 1.15 mmol/L (ref 1.12–1.23)
Glucose, Bld: 143 mg/dL — ABNORMAL HIGH (ref 70–99)
TCO2: 26 mmol/L (ref 0–100)

## 2012-12-29 LAB — CBC WITH DIFFERENTIAL/PLATELET
Eosinophils Absolute: 0.1 10*3/uL (ref 0.0–0.7)
Eosinophils Relative: 1 % (ref 0–5)
HCT: 39.3 % (ref 39.0–52.0)
Lymphocytes Relative: 15 % (ref 12–46)
Lymphs Abs: 1.8 10*3/uL (ref 0.7–4.0)
MCH: 30.5 pg (ref 26.0–34.0)
MCV: 86.9 fL (ref 78.0–100.0)
Monocytes Absolute: 1.3 10*3/uL — ABNORMAL HIGH (ref 0.1–1.0)
Platelets: 357 10*3/uL (ref 150–400)
RDW: 15.7 % — ABNORMAL HIGH (ref 11.5–15.5)

## 2012-12-29 LAB — CBC
HCT: 37.3 % — ABNORMAL LOW (ref 39.0–52.0)
Hemoglobin: 12.8 g/dL — ABNORMAL LOW (ref 13.0–17.0)
MCH: 29.9 pg (ref 26.0–34.0)
MCHC: 34.3 g/dL (ref 30.0–36.0)
MCV: 87.1 fL (ref 78.0–100.0)

## 2012-12-29 LAB — POCT I-STAT TROPONIN I: Troponin i, poc: 0 ng/mL (ref 0.00–0.08)

## 2012-12-29 LAB — URIC ACID: Uric Acid, Serum: 11.6 mg/dL — ABNORMAL HIGH (ref 4.0–7.8)

## 2012-12-29 IMAGING — CR DG CHEST 2V
2 series · 2 of 2 positions shown · non-contrast
Comparison: [DATE]

CLINICAL DATA: Shortness of breath

CHEST - 2 VIEW

[w chest lat]
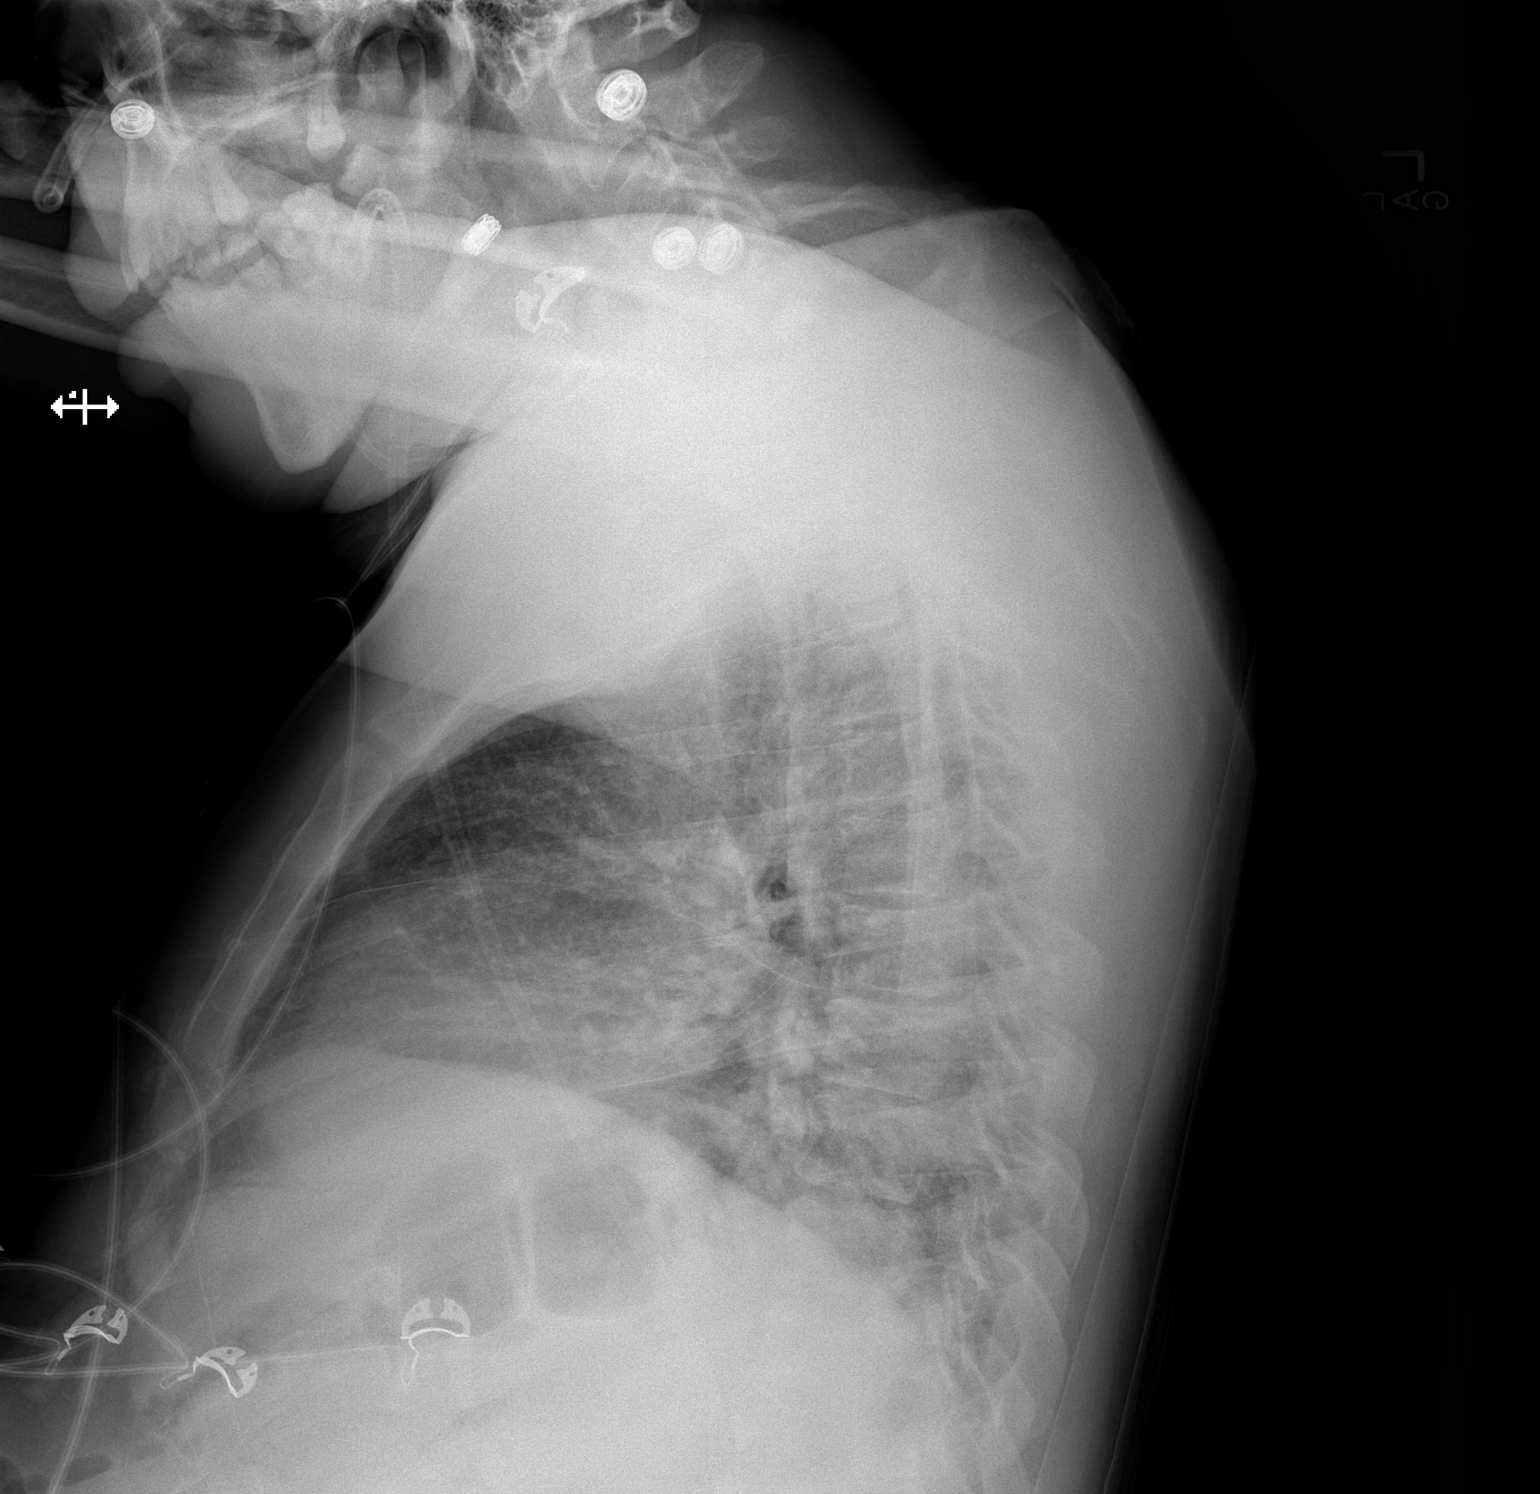

[x chest ap]
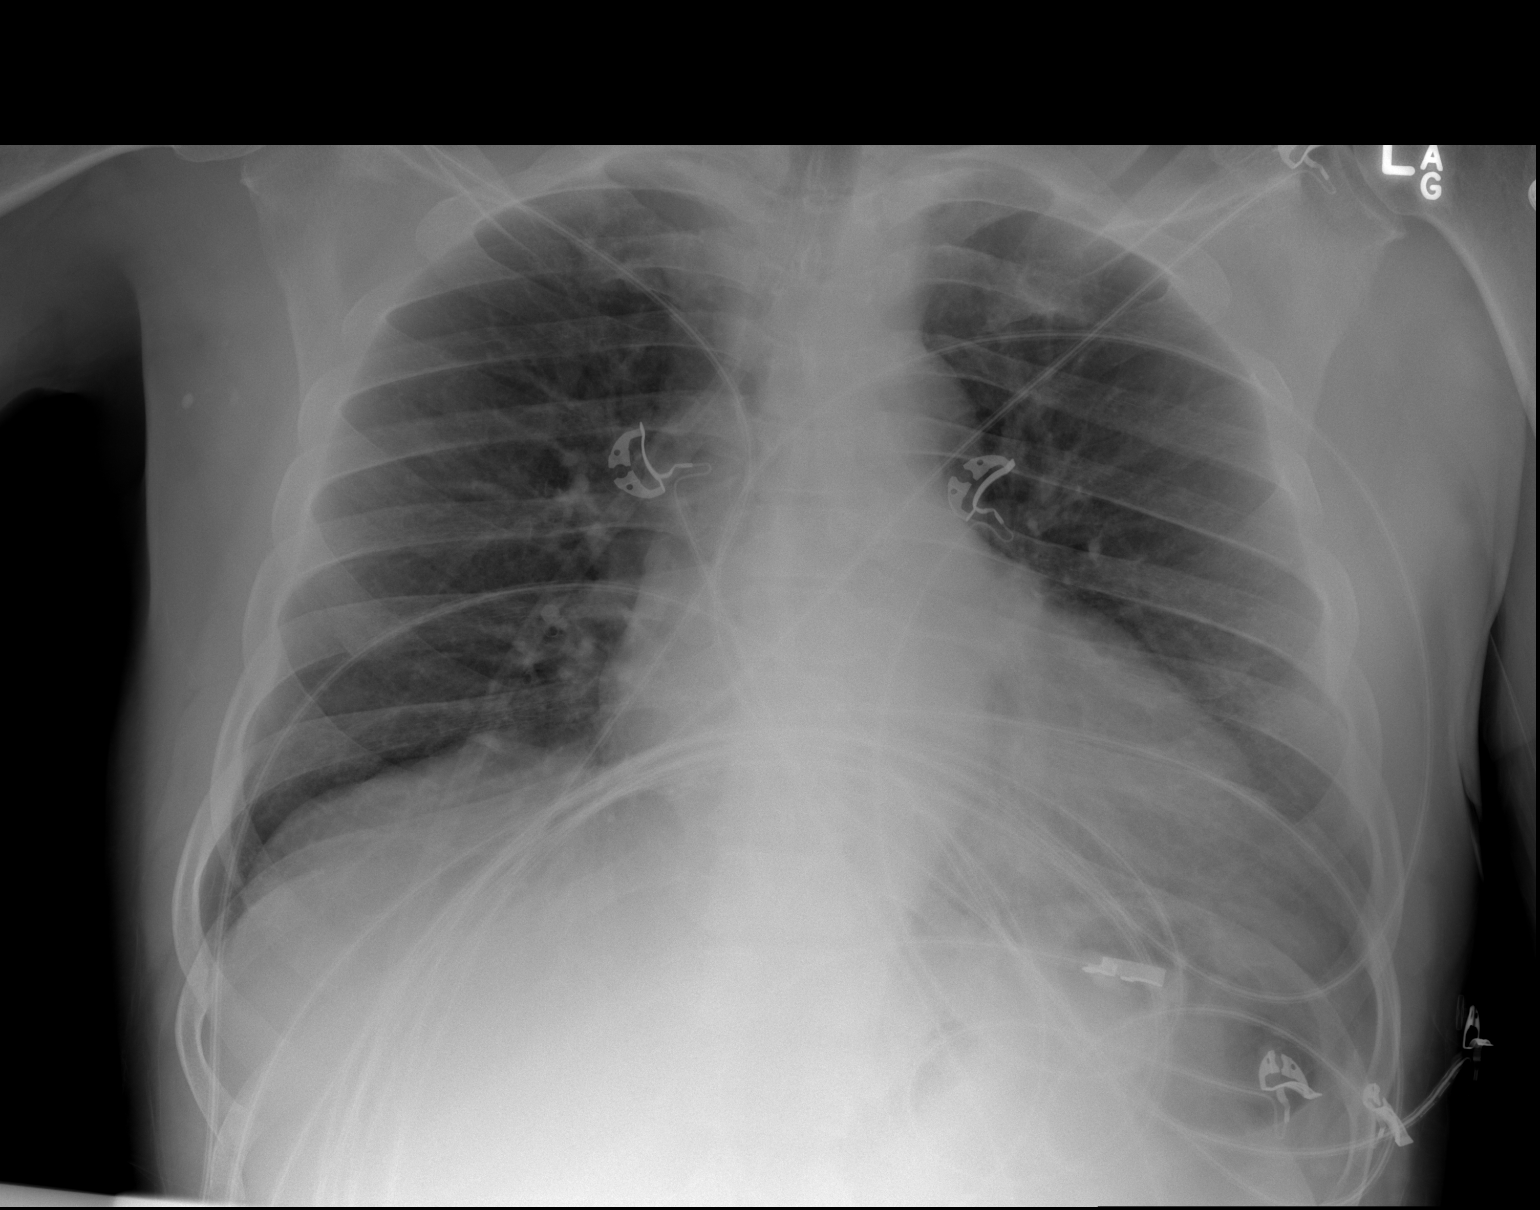

[2 of 2 positions shown; findings below may reference images not displayed]

FINDINGS: The cardiomediastinal silhouette is unremarkable.
Mild peribronchial thickening again noted.
Increased bibasilar opacities are noted on the lateral view -
likely atelectasis.
There is no evidence of pleural effusion or pneumothorax.
No acute bony abnormalities are present.
IMPRESSION: Increased bibasilar opacities - likely atelectasis.

Mild chronic peribronchial thickening / COPD.

## 2012-12-29 IMAGING — CR DG CHEST 2V
2 series · 2 of 2 positions shown · non-contrast
Comparison: [DATE] and prior chest radiographs

CLINICAL DATA: 51-year-old male with chest pain and shortness of
breath.

CHEST - 2 VIEW

[w chest lat]
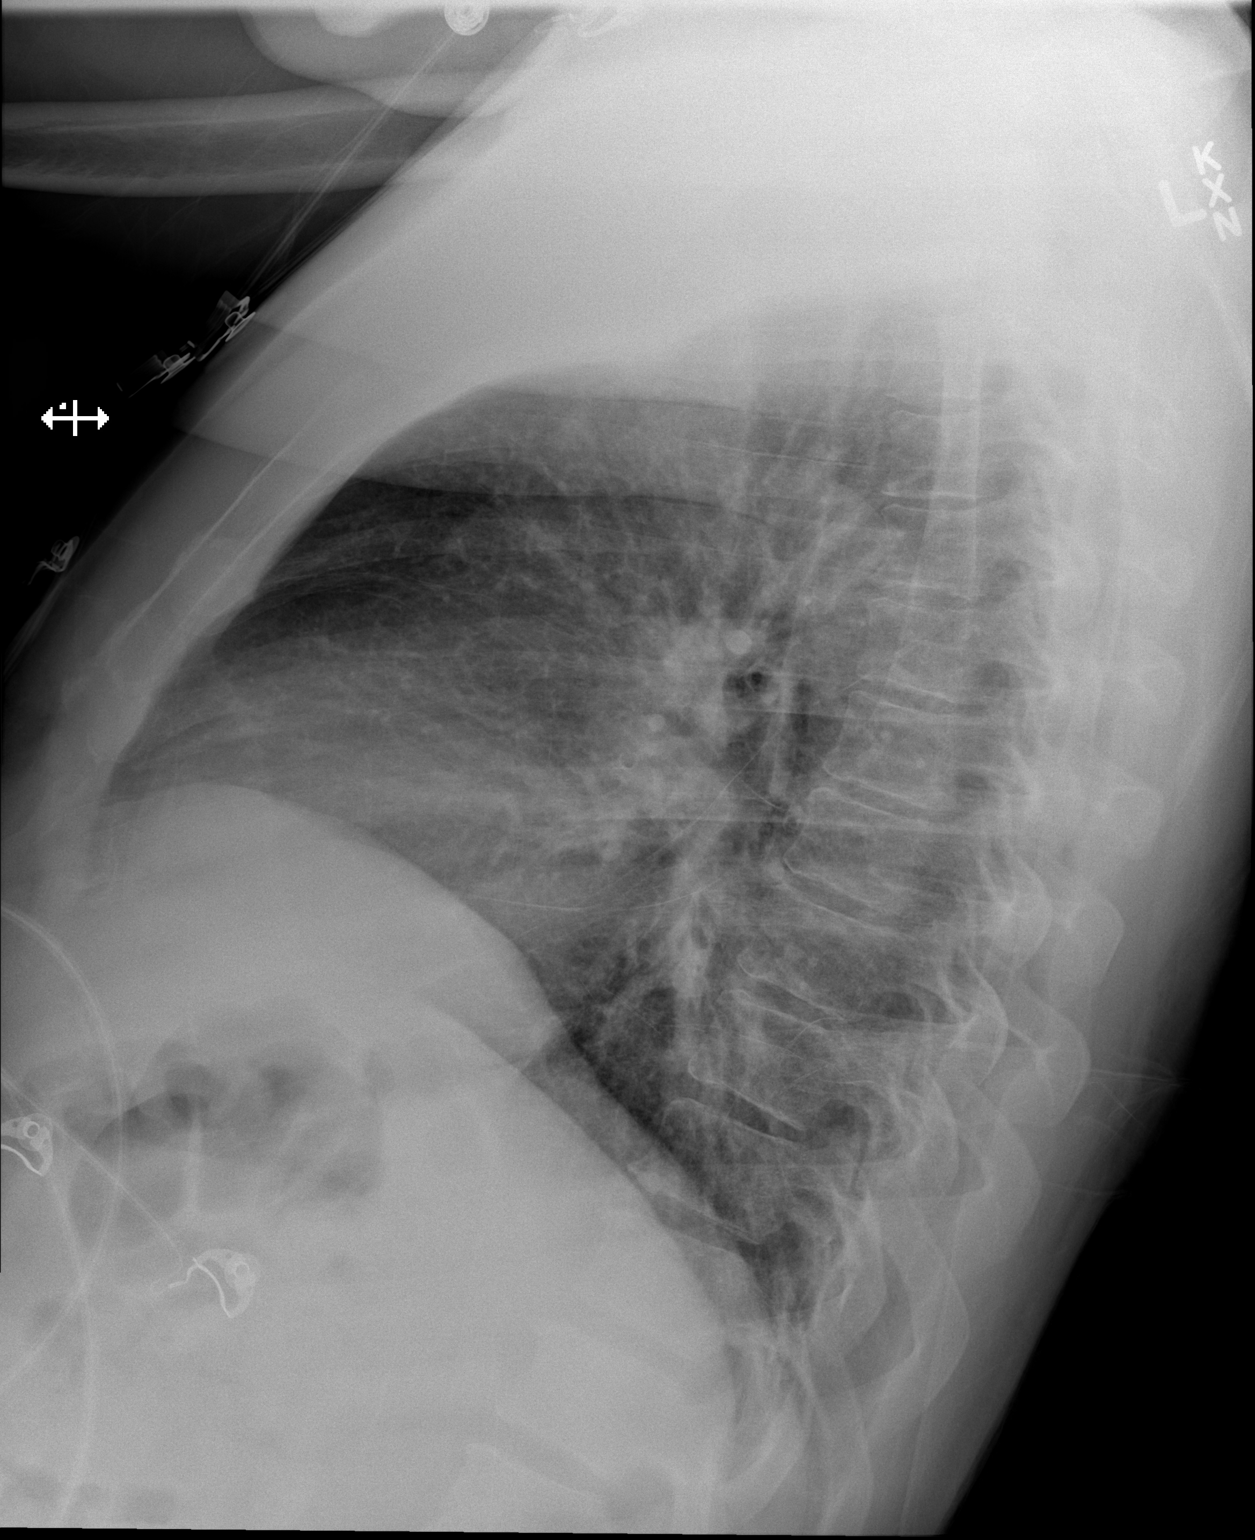

[x chest ap]
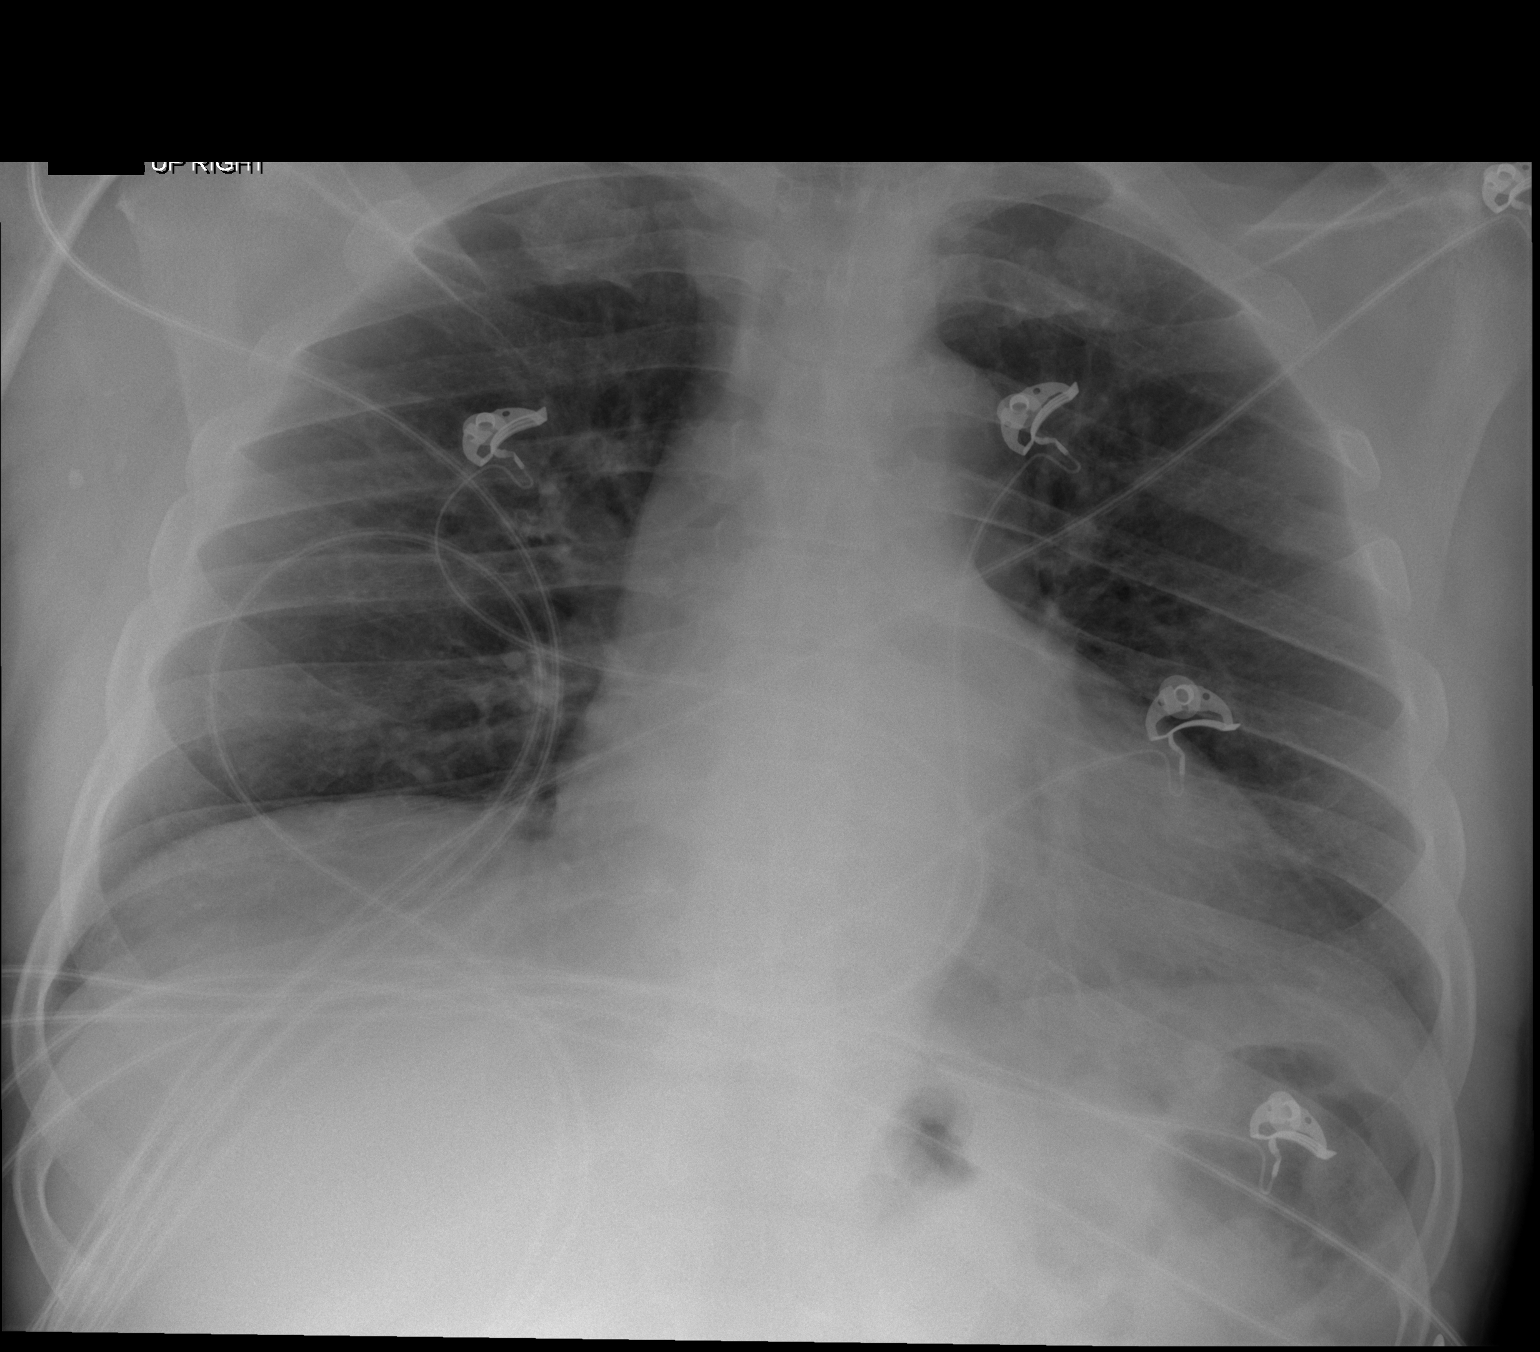

[2 of 2 positions shown; findings below may reference images not displayed]

FINDINGS: The cardiomediastinal silhouette is unremarkable.
COPD is again identified.
There is no evidence of focal airspace disease, pulmonary edema,
suspicious pulmonary nodule/mass, pleural effusion, or
pneumothorax.
No acute bony abnormalities are identified.
IMPRESSION: No evidence of acute cardiopulmonary disease.

COPD.

## 2012-12-29 IMAGING — CT CT HEAD W/O CM
2 of 4 series · 17 of 30 positions shown, 20 images · non-contrast
Comparison: [DATE]

CLINICAL DATA: Unsteady gait, alcohol use

CT HEAD WITHOUT CONTRAST
TECHNIQUE: Contiguous axial images were obtained from the base of
the skull through the vertex without contrast.

[Series 2: head w/o · axial · non-contrast · 0.43mm/px · z∈[+542,+657]mm · 11 of 29 slices shown, 14 images]
[im 3/29  brain]
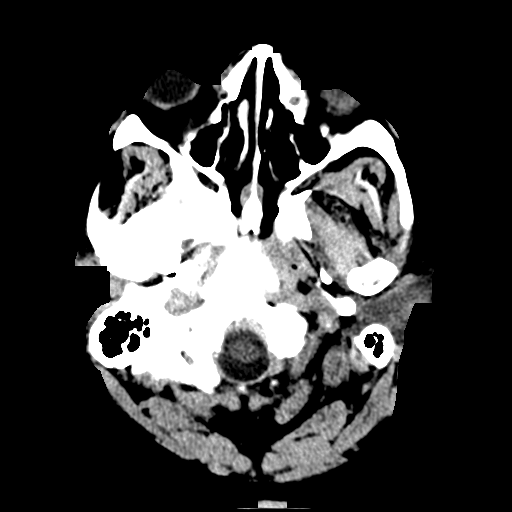
[im 3/29  bone]
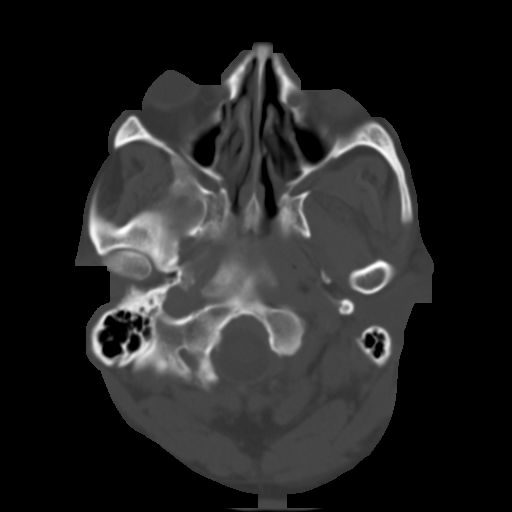
[im 5/29  brain]
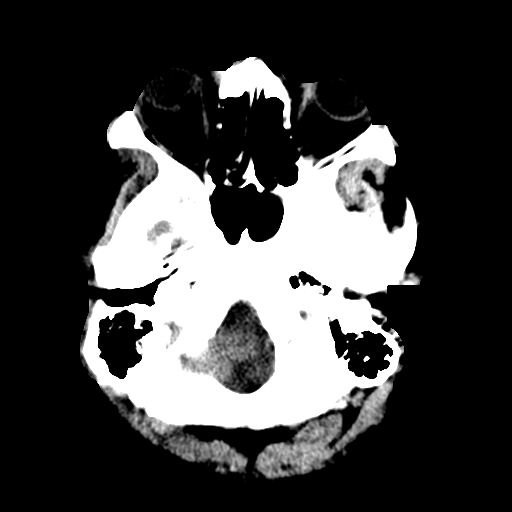
[im 8/29  brain]
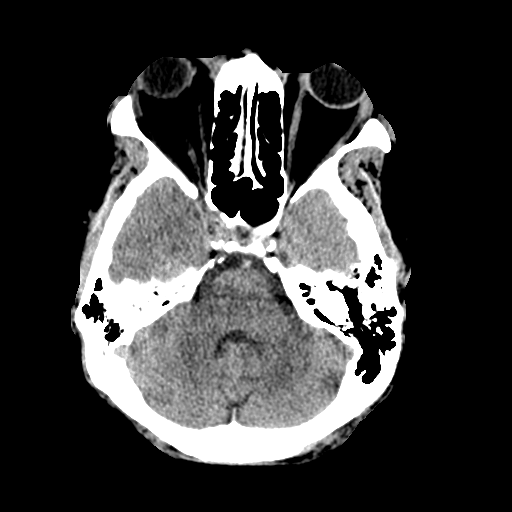
[im 10/29  brain]
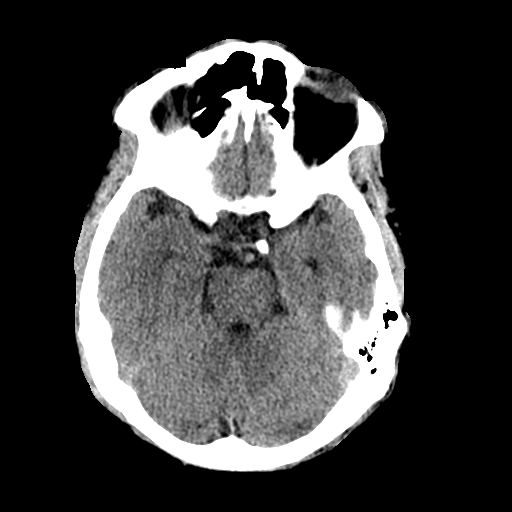
[im 12/29  brain]
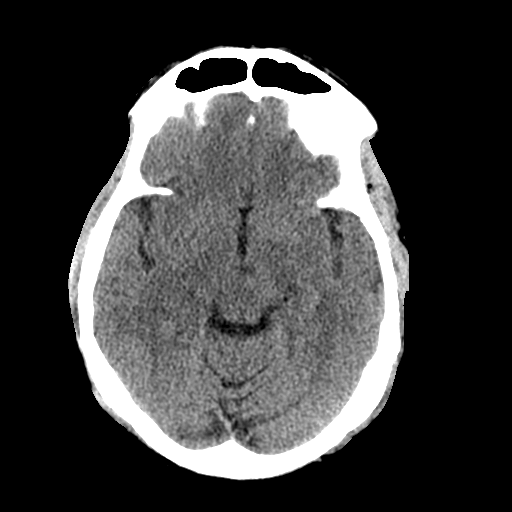
[im 12/29  bone]
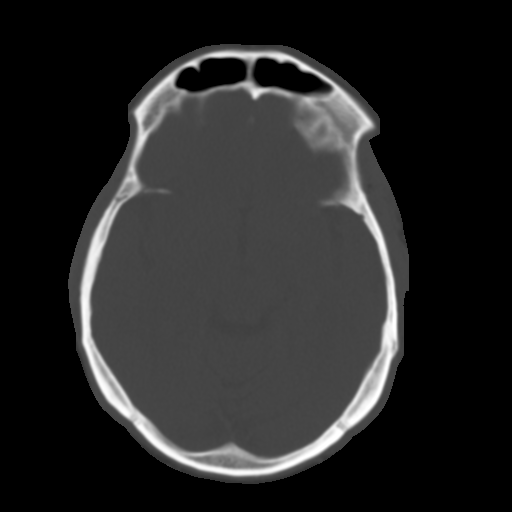
[im 15/29  brain]
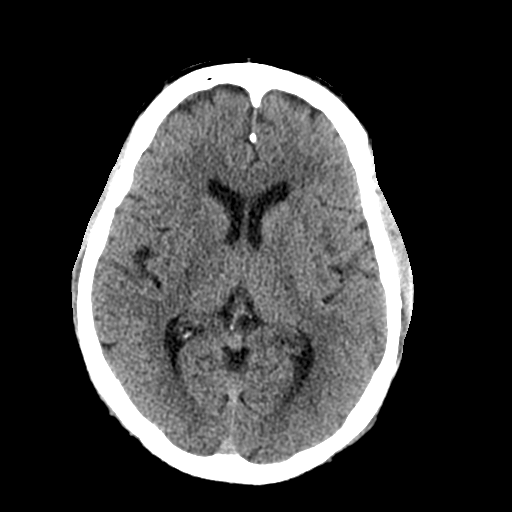
[im 17/29  brain]
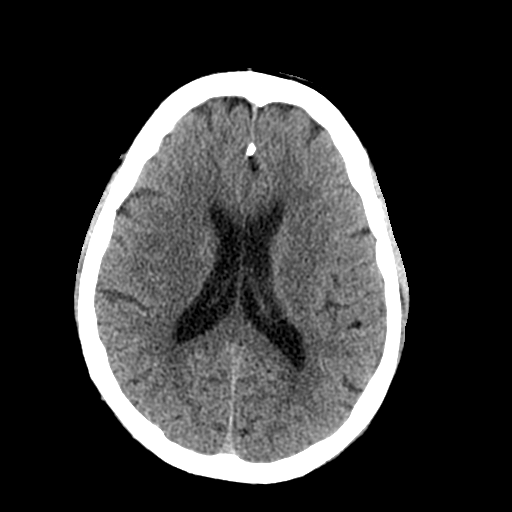
[im 19/29  brain]
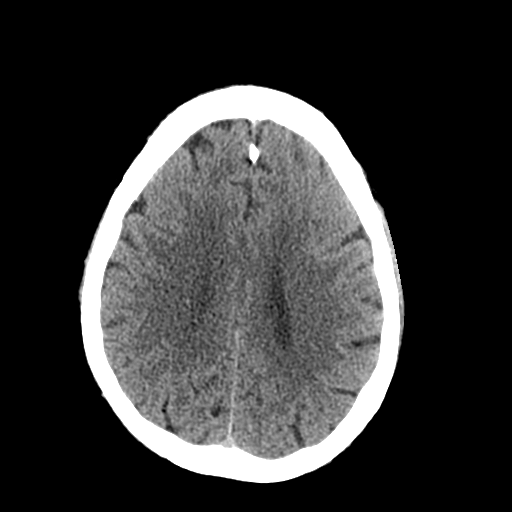
[im 22/29  brain]
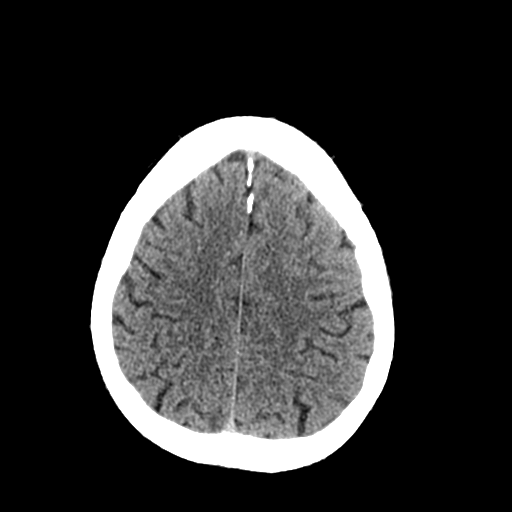
[im 22/29  bone]
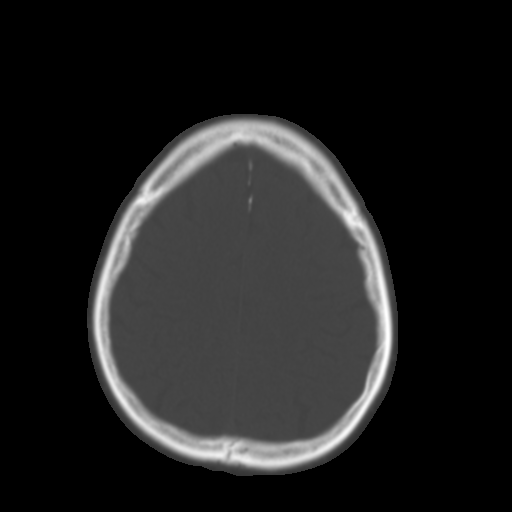
[im 24/29  brain]
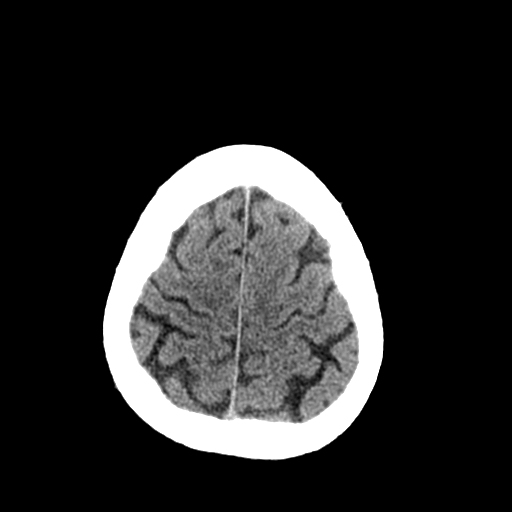
[im 26/29  brain]
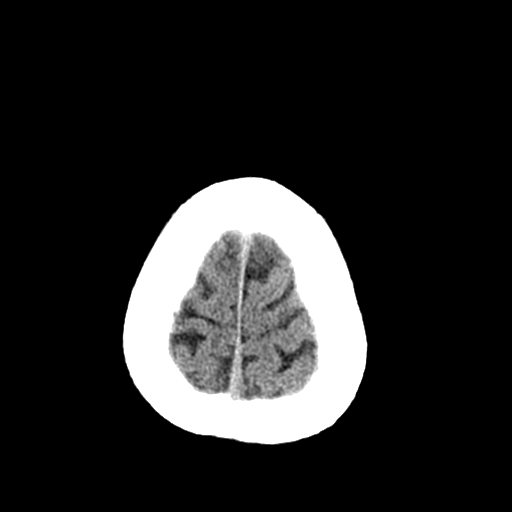

[Series 3: bone windows · axial · 0.43mm/px · z∈[+542,+622]mm · 6 of 29 slices shown]
[im 3/29  bone]
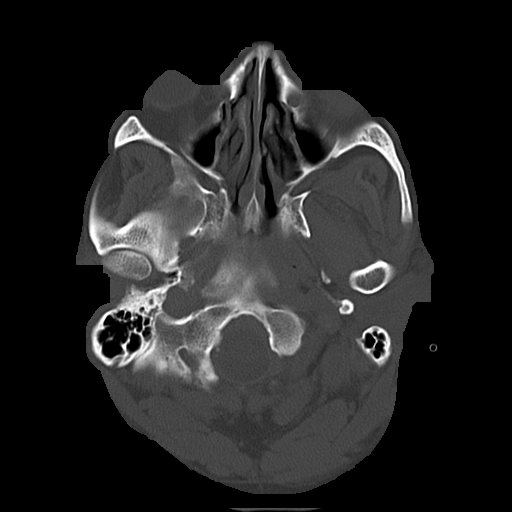
[im 8/29  bone]
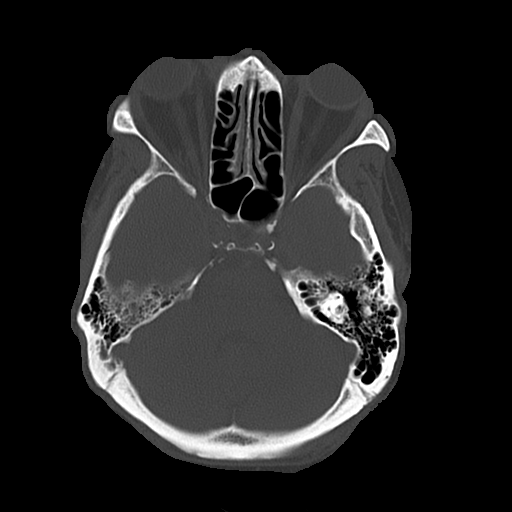
[im 10/29  bone]
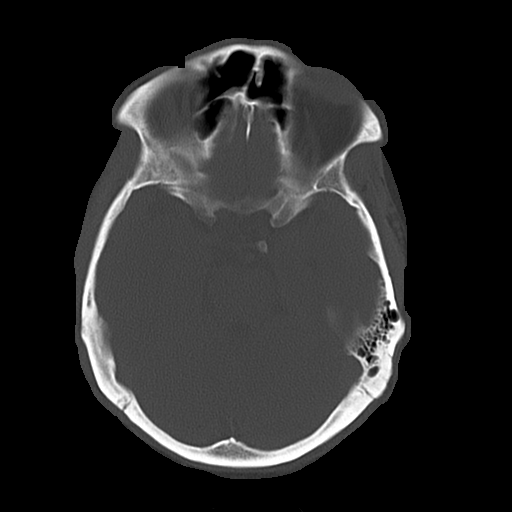
[im 12/29  bone]
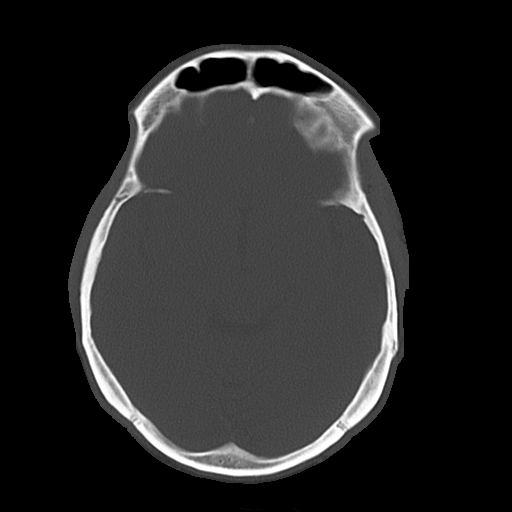
[im 17/29  bone]
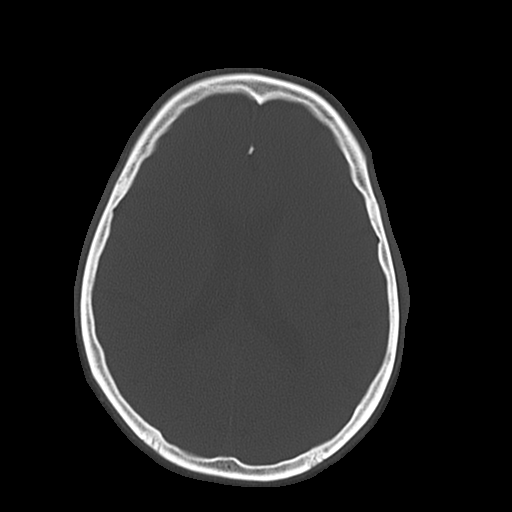
[im 19/29  bone]
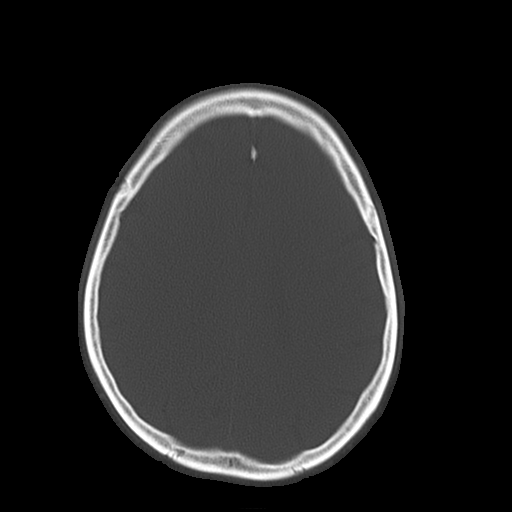

[17 of 30 positions shown; findings below may reference images not displayed]

FINDINGS: Calvarium is intact.  No hemorrhage, infarct, mass, or
extra-axial fluid.  No hydrocephalus.  No significant inflammatory
change in the sinuses or mastoid air cells.
IMPRESSION: No acute abnormalities

## 2012-12-29 MED ORDER — FENTANYL CITRATE 0.05 MG/ML IJ SOLN
100.0000 ug | Freq: Once | INTRAMUSCULAR | Status: AC
  Administered 2012-12-29: 100 ug via INTRAVENOUS
  Filled 2012-12-29: qty 2

## 2012-12-29 MED ORDER — ATENOLOL 100 MG PO TABS: ORAL_TABLET | ORAL | Status: DC

## 2012-12-29 MED ORDER — NAPROXEN 250 MG PO TABS
500.0000 mg | ORAL_TABLET | Freq: Once | ORAL | Status: AC
  Administered 2012-12-29: 500 mg via ORAL
  Filled 2012-12-29: qty 2
  Filled 2012-12-29: qty 1

## 2012-12-29 MED ORDER — SODIUM CHLORIDE 0.9 % IV SOLN
Freq: Once | INTRAVENOUS | Status: AC
  Administered 2012-12-29: 20 mL/h via INTRAVENOUS

## 2012-12-29 MED ORDER — ALBUTEROL SULFATE (5 MG/ML) 0.5% IN NEBU
5.0000 mg | INHALATION_SOLUTION | Freq: Once | RESPIRATORY_TRACT | Status: AC
  Administered 2012-12-29: 5 mg via RESPIRATORY_TRACT
  Filled 2012-12-29 (×2): qty 1

## 2012-12-29 MED ORDER — PREDNISONE 20 MG PO TABS
60.0000 mg | ORAL_TABLET | Freq: Once | ORAL | Status: AC
  Administered 2012-12-30: 60 mg via ORAL
  Filled 2012-12-29: qty 3

## 2012-12-29 MED ORDER — IPRATROPIUM BROMIDE 0.02 % IN SOLN
0.5000 mg | Freq: Once | RESPIRATORY_TRACT | Status: AC
  Administered 2012-12-29: 0.5 mg via RESPIRATORY_TRACT
  Filled 2012-12-29: qty 2.5

## 2012-12-29 MED ORDER — OXYCODONE HCL 10 MG PO TABS: 10.0000 mg | ORAL_TABLET | ORAL | Status: DC | PRN

## 2012-12-29 MED ORDER — IPRATROPIUM BROMIDE 0.02 % IN SOLN
0.5000 mg | Freq: Once | RESPIRATORY_TRACT | Status: AC
  Administered 2012-12-29: 0.5 mg via RESPIRATORY_TRACT
  Filled 2012-12-29 (×2): qty 2.5

## 2012-12-29 MED ORDER — METHYLPREDNISOLONE 4 MG PO KIT: PACK | ORAL | Status: DC

## 2012-12-29 MED ORDER — ATENOLOL 50 MG PO TABS
100.0000 mg | ORAL_TABLET | Freq: Once | ORAL | Status: AC
  Administered 2012-12-29: 100 mg via ORAL
  Filled 2012-12-29: qty 2

## 2012-12-29 MED ORDER — ALBUTEROL SULFATE HFA 108 (90 BASE) MCG/ACT IN AERS
2.0000 | INHALATION_SPRAY | RESPIRATORY_TRACT | Status: DC | PRN
  Filled 2012-12-29: qty 6.7

## 2012-12-29 MED ORDER — ALBUTEROL SULFATE (5 MG/ML) 0.5% IN NEBU
5.0000 mg | INHALATION_SOLUTION | Freq: Once | RESPIRATORY_TRACT | Status: AC
  Administered 2012-12-29: 5 mg via RESPIRATORY_TRACT
  Filled 2012-12-29: qty 40

## 2012-12-29 NOTE — ED Provider Notes (Addendum)
History     CSN: [id number]  Arrival date & time 12/29/12  0027   First MD Initiated Contact with Patient 12/29/12 [phone]      Chief Complaint  Patient presents with  . Chest Pain    (Consider location/radiation/quality/duration/timing/severity/associated sxs/prior treatment) HPI This is a 52 year old male with a three-day history of pain in his chest. The pain was initially on the right side of his chest but yesterday moved to the left side of his chest. The pain is sharp and worse with movement or some deep breathing. It is also exacerbated by palpation. He has also been coughing and shortness of breath during this time. He states he is having a gout attack with pain in his right hand, pain and swelling in his knees bilaterally and pain in his right ankle. He is wearing an ankle support on his right ankle. He describes his symptoms as moderate to severe. He has a history of hypertension but admits he has not been taking any medications for it due to financial reasons. He states the pain of his gout is severe enough that he is not able to walk. He denies nausea, vomiting, fever or chills.  Past Medical History  Diagnosis Date  . Hypertension   . Gout   . Angina pectoris     No past surgical history on file.  No family history on file.  History  Substance Use Topics  . Smoking status: Not on file  . Smokeless tobacco: Not on file  . Alcohol Use: Yes      Review of Systems  All other systems reviewed and are negative.    Allergies  Review of patient's allergies indicates no known allergies.  Home Medications   No current outpatient prescriptions on file.  BP 161/112  Pulse 94  Temp 98.6 F (37 C) (Oral)  Resp 24  SpO2 93%  Physical Exam General: Well-developed, well-nourished male in no acute distress; appearance consistent with age of record HENT: normocephalic, atraumatic Eyes: pupils equal round and reactive to light; extraocular muscles intact Neck:  supple Heart: regular rate and rhythm Lungs: Rhonchi diffusely with decreased air movement; mild tachypnea Chest: Chest wall tenderness, left greater than right Abdomen: soft; nondistended; nontender; bowel sounds present Extremities: No deformity; swelling of fingers of right hand with tenderness; effusions and tenderness of knees bilaterally with swelling and tenderness of right prepatellar bursa; right ankle tender and immobilized in ankle support orthotic Neurologic: Awake, alert and oriented; motor function intact in all extremities and symmetric; no facial droop Skin: Warm, clammy Psychiatric: Appears anxious    ED Course  Procedures (including critical care time)    MDM   Nursing notes and vitals signs, including pulse oximetry, reviewed.  Summary of this visit's results, reviewed by myself:  Labs:  Results for orders placed during the hospital encounter of 12/29/12 (from the past 24 hour(s))  PRO B NATRIURETIC PEPTIDE     Status: Abnormal   Collection Time   12/29/12 12:47 AM      Component Value Range   Pro B Natriuretic peptide (BNP) 257.0 (*) 0 - 125 pg/mL  ETHANOL     Status: Normal   Collection Time   12/29/12 12:48 AM      Component Value Range   Alcohol, Ethyl (B) <11  0 - 11 mg/dL  URIC ACID     Status: Abnormal   Collection Time   12/29/12 12:48 AM      Component Value Range   Uric  Acid, Serum 11.6 (*) 4.0 - 7.8 mg/dL  CBC WITH DIFFERENTIAL     Status: Abnormal   Collection Time   12/29/12 12:54 AM      Component Value Range   WBC 11.5 (*) 4.0 - 10.5 K/uL   RBC 4.52  4.22 - 5.81 MIL/uL   Hemoglobin 13.8  13.0 - 17.0 g/dL   HCT 16.1  09.6 - 04.5 %   MCV 86.9  78.0 - 100.0 fL   MCH 30.5  26.0 - 34.0 pg   MCHC 35.1  30.0 - 36.0 g/dL   RDW 40.9 (*) 81.1 - 91.4 %   Platelets 357  150 - 400 K/uL   Neutrophils Relative 72  43 - 77 %   Neutro Abs 8.3 (*) 1.7 - 7.7 K/uL   Lymphocytes Relative 15  12 - 46 %   Lymphs Abs 1.8  0.7 - 4.0 K/uL   Monocytes  Relative 11  3 - 12 %   Monocytes Absolute 1.3 (*) 0.1 - 1.0 K/uL   Eosinophils Relative 1  0 - 5 %   Eosinophils Absolute 0.1  0.0 - 0.7 K/uL   Basophils Relative 0  0 - 1 %   Basophils Absolute 0.0  0.0 - 0.1 K/uL  POCT I-STAT TROPONIN I     Status: Normal   Collection Time   12/29/12  1:10 AM      Component Value Range   Troponin i, poc 0.00  0.00 - 0.08 ng/mL   Comment 3           POCT I-STAT, CHEM 8     Status: Abnormal   Collection Time   12/29/12  1:12 AM      Component Value Range   Sodium 137  135 - 145 mEq/L   Potassium 3.7  3.5 - 5.1 mEq/L   Chloride 104  96 - 112 mEq/L   BUN 11  6 - 23 mg/dL   Creatinine, Ser 7.82 (*) 0.50 - 1.35 mg/dL   Glucose, Bld 956 (*) 70 - 99 mg/dL   Calcium, Ion 2.13  0.86 - 1.23 mmol/L   TCO2 26  0 - 100 mmol/L   Hemoglobin 13.9  13.0 - 17.0 g/dL   HCT 57.8  46.9 - 62.9 %    Imaging Studies: Dg Chest 2 View  12/29/2012  *RADIOLOGY REPORT*  Clinical Data: 52 year old male with chest pain and shortness of breath.  CHEST - 2 VIEW  Comparison: 09/10/2012 and prior chest radiographs  Findings: The cardiomediastinal silhouette is unremarkable. COPD is again identified. There is no evidence of focal airspace disease, pulmonary edema, suspicious pulmonary nodule/mass, pleural effusion, or pneumothorax. No acute bony abnormalities are identified.  IMPRESSION: No evidence of acute cardiopulmonary disease.  COPD.   Original Report Authenticated By: Harmon Pier, M.D.       EKG Interpretation:  Date & Time: 12/29/2012 12:34 AM  Rate: 89  Rhythm: normal sinus rhythm  QRS Axis: normal  Intervals: normal  ST/T Wave abnormalities: Lateral T wave inversions  Conduction Disutrbances:none  Narrative Interpretation: LVH  Old EKG Reviewed: unchanged  2:36 AM Patient's pain significantly improved with IV fentanyl and naproxen by mouth. Shortness of breath improved with albuterol and Atrovent neb treatment; patient's tachypnea is resolved in air movement is  improved. The plan is to restart him on his blood pressure medication, provided with albuterol inhaler and treat his acute gout pain. We will have him meet with our social worker later this morning  for assistance.  6:08 AM Patient has been stable overnight. We are still planning to have him meet with the social worker this morning.       Hanley Seamen, MD 12/29/12 1610  Hanley Seamen, MD 12/29/12 [phone]

## 2012-12-29 NOTE — ED Notes (Signed)
Pt now reporting pain to left chest that hurts "when I lift my left arm and take a deep breath" x 1 month.

## 2012-12-29 NOTE — ED Notes (Signed)
MD at bedside. 

## 2012-12-29 NOTE — ED Notes (Signed)
Per EMS: Pt from home with "chest wall" pain x 3 days with pain with deep breaths. Pt has NP cough x 3 days. Rhonchi auscultated in bilateral upper lungs. Pt reports weakness. Pt diaphoretic. Denies N/V. Hx: HTN, gout. 97% RA. 100 NSR. 206/138.

## 2012-12-29 NOTE — ED Notes (Signed)
Per EMS: Pt was at a friends house dancing when he became SOB. Pt admits to drinking EtOH "one 40". Lung sounds clear. Hx of COPD.

## 2012-12-29 NOTE — ED Notes (Signed)
Spoke with social work and she is on her way to see pt. Pt meal ordered and given drinks.

## 2012-12-29 NOTE — ED Notes (Signed)
Pt reports generalized weakness, NP cough and dizziness x 3 days. Denies pain except for bilateral knees due to gout. Pt hypertensive but states he doesn't take any rx for it. Denies N/V/D. No pain on palpation to abdomen.

## 2012-12-29 NOTE — ED Notes (Signed)
WUJ:[id number]<JY> Expected date:12/29/12<BR> Expected time: 8:14 PM<BR> Means of arrival:Ambulance<BR> Comments:<BR> Shortness of breath, ETOH

## 2012-12-30 LAB — BASIC METABOLIC PANEL
BUN: 15 mg/dL (ref 6–23)
Calcium: 9.1 mg/dL (ref 8.4–10.5)
Creatinine, Ser: 1.11 mg/dL (ref 0.50–1.35)
GFR calc non Af Amer: 76 mL/min — ABNORMAL LOW (ref >90)
Glucose, Bld: 134 mg/dL — ABNORMAL HIGH (ref 70–99)
Sodium: 137 mEq/L (ref 135–145)

## 2012-12-30 MED ORDER — AZITHROMYCIN 250 MG PO TABS: 250.0000 mg | ORAL_TABLET | Freq: Every day | ORAL | Status: DC

## 2012-12-30 NOTE — ED Provider Notes (Signed)
History     CSN: [id number]  Arrival date & time 12/29/12  2024   First MD Initiated Contact with Patient 12/29/12 2059      Chief Complaint  Patient presents with  . Shortness of Breath    The history is provided by the patient.   patient reports he was at his friend's house dancing and became more short of breath this evening.  His had some shortness of breath over the past several days including some productive cough and pain in his chest.  He was seen emergency room last night and was told he was having a COPD exacerbation.  He was given albuterol inhaler which he does use some today.  He is also prescribed blood pressure medications as well as steroids.  He has not filled any of his medication secondary to what he describes is no money for his medications.  He was drinking alcohol this evening.  He states his shortness of breath feels much better at this time.  His symptoms are mild to moderate in severity.  His symptoms are improving.  He denies unilateral leg swelling.  No radiation of his chest pain.  No prior history of cardiac disease.  He denies cocaine abuse.  Past Medical History  Diagnosis Date  . Hypertension   . Gout   . Angina pectoris     History reviewed. No pertinent past surgical history.  No family history on file.  History  Substance Use Topics  . Smoking status: Current Some Day Smoker    Types: Cigarettes  . Smokeless tobacco: Not on file  . Alcohol Use: Yes      Review of Systems  Respiratory: Positive for shortness of breath.   All other systems reviewed and are negative.    Allergies  Review of patient's allergies indicates no known allergies.  Home Medications   Current Outpatient Rx  Name  Route  Sig  Dispense  Refill  . ATENOLOL 100 MG PO TABS      Take one -half tablet daily for 7 days, then one tablet daily thereafter.   30 tablet   0   . METHYLPREDNISOLONE 4 MG PO KIT      Take tapering dose per package instructions.   21  tablet   0   . AZITHROMYCIN 250 MG PO TABS   Oral   Take 1 tablet (250 mg total) by mouth daily. Take 2 tabs for first dose, then 1 tab for each additional dose   6 tablet   0   . OXYCODONE HCL 10 MG PO TABS   Oral   Take 1 tablet (10 mg total) by mouth every 4 (four) hours as needed (for pain).   30 tablet   0     BP 125/73  Pulse 86  Temp 98.3 F (36.8 C) (Oral)  Resp 16  SpO2 97%  Physical Exam  Nursing note and vitals reviewed. Constitutional: He is oriented to person, place, and time. He appears well-developed and well-nourished.  HENT:  Head: Normocephalic and atraumatic.  Eyes: EOM are normal. Pupils are equal, round, and reactive to light.  Neck: Normal range of motion.  Cardiovascular: Normal rate, regular rhythm, normal heart sounds and intact distal pulses.   Pulmonary/Chest: Effort normal and breath sounds normal. No respiratory distress.  Abdominal: Soft. He exhibits no distension. There is no tenderness.  Musculoskeletal: Normal range of motion.  Neurological: He is alert and oriented to person, place, and time.  5/5 strength in major muscle groups of  bilateral upper and lower extremities. Speech normal. No facial asymetry.   Skin: Skin is warm and dry.  Psychiatric: He has a normal mood and affect. Judgment normal.    ED Course  Procedures (including critical care time)  Labs Reviewed  CBC - Abnormal; Notable for the following:    Hemoglobin 12.8 (*)     HCT 37.3 (*)     RDW 16.0 (*)     All other components within normal limits  BASIC METABOLIC PANEL - Abnormal; Notable for the following:    Glucose, Bld 134 (*)     GFR calc non Af Amer 76 (*)     GFR calc Af Amer 88 (*)     All other components within normal limits  ETHANOL - Abnormal; Notable for the following:    Alcohol, Ethyl (B) 167 (*)     All other components within normal limits  TROPONIN I   Dg Chest 2 View  12/29/2012  *RADIOLOGY REPORT*  Clinical Data: Shortness of breath   CHEST - 2 VIEW  Comparison: 12/29/2012  Findings: The cardiomediastinal silhouette is unremarkable. Mild peribronchial thickening again noted. Increased bibasilar opacities are noted on the lateral view - likely atelectasis. There is no evidence of pleural effusion or pneumothorax. No acute bony abnormalities are present.  IMPRESSION: Increased bibasilar opacities - likely atelectasis.  Mild chronic peribronchial thickening / COPD.   Original Report Authenticated By: Harmon Pier, M.D.    Dg Chest 2 View  12/29/2012  *RADIOLOGY REPORT*  Clinical Data: 52 year old male with chest pain and shortness of breath.  CHEST - 2 VIEW  Comparison: 09/10/2012 and prior chest radiographs  Findings: The cardiomediastinal silhouette is unremarkable. COPD is again identified. There is no evidence of focal airspace disease, pulmonary edema, suspicious pulmonary nodule/mass, pleural effusion, or pneumothorax. No acute bony abnormalities are identified.  IMPRESSION: No evidence of acute cardiopulmonary disease.  COPD.   Original Report Authenticated By: Harmon Pier, M.D.    Ct Head Wo Contrast  12/30/2012  *RADIOLOGY REPORT*  Clinical Data: Unsteady gait, alcohol use  CT HEAD WITHOUT CONTRAST  Technique:  Contiguous axial images were obtained from the base of the skull through the vertex without contrast.  Comparison: 12/03/2004  Findings: Calvarium is intact.  No hemorrhage, infarct, mass, or extra-axial fluid.  No hydrocephalus.  No significant inflammatory change in the sinuses or mastoid air cells.  IMPRESSION:   No acute abnormalities   Original Report Authenticated By: Esperanza Heir, M.D.    I personally reviewed the imaging tests through PACS system I reviewed available ER/hospitalization records through the EMR   1. Bronchitis   2. COPD (chronic obstructive pulmonary disease)   3. H/O medication noncompliance   4. Alcohol intoxication   5. Tobacco abuse       MDM  Likely COPD exacerbation.  I recommended  that the patient stop drinking alcohol and stop smoking cigarettes.  He is intoxicated the emergency department.  His chest x-ray today shows possible worsening bibasilar opacities which may represent atelectasis.  This may also represent atypical pneumonia.  Home with azithromycin.  I recommended that he fill his prescriptions prescribed to him yesterday as instructed by the prior physician        Lyanne Co, MD 12/30/12 [phone]

## 2013-01-19 ENCOUNTER — Encounter (HOSPITAL_COMMUNITY): Payer: Self-pay | Admitting: Family Medicine

## 2013-01-19 ENCOUNTER — Observation Stay (HOSPITAL_COMMUNITY)
Admission: EM | Admit: 2013-01-19 | Discharge: 2013-01-20 | Disposition: A | Discharge status: Home / Self Care | Payer: Medicaid Other | Attending: Family Medicine | Admitting: Family Medicine

## 2013-01-19 ENCOUNTER — Emergency Department (HOSPITAL_COMMUNITY): Payer: Medicaid Other

## 2013-01-19 DIAGNOSIS — F17200 Nicotine dependence, unspecified, uncomplicated: Secondary | ICD-10-CM | POA: Insufficient documentation

## 2013-01-19 DIAGNOSIS — J40 Bronchitis, not specified as acute or chronic: Secondary | ICD-10-CM

## 2013-01-19 DIAGNOSIS — J441 Chronic obstructive pulmonary disease with (acute) exacerbation: Principal | ICD-10-CM | POA: Diagnosis present

## 2013-01-19 DIAGNOSIS — I10 Essential (primary) hypertension: Secondary | ICD-10-CM | POA: Diagnosis present

## 2013-01-19 DIAGNOSIS — M109 Gout, unspecified: Secondary | ICD-10-CM | POA: Diagnosis present

## 2013-01-19 DIAGNOSIS — R7309 Other abnormal glucose: Secondary | ICD-10-CM | POA: Insufficient documentation

## 2013-01-19 HISTORY — DX: Chronic obstructive pulmonary disease, unspecified: J44.9

## 2013-01-19 LAB — CBC WITH DIFFERENTIAL/PLATELET
Basophils Absolute: 0 10*3/uL (ref 0.0–0.1)
Eosinophils Relative: 0 % (ref 0–5)
Lymphocytes Relative: 13 % (ref 12–46)
MCV: 89.6 fL (ref 78.0–100.0)
Platelets: 259 10*3/uL (ref 150–400)
RDW: 15.7 % — ABNORMAL HIGH (ref 11.5–15.5)
WBC: 11.6 10*3/uL — ABNORMAL HIGH (ref 4.0–10.5)

## 2013-01-19 LAB — COMPREHENSIVE METABOLIC PANEL
ALT: 17 U/L (ref 0–53)
AST: 30 U/L (ref 0–37)
Albumin: 3.1 g/dL — ABNORMAL LOW (ref 3.5–5.2)
CO2: 24 mEq/L (ref 19–32)
Calcium: 9.5 mg/dL (ref 8.4–10.5)
GFR calc non Af Amer: 64 mL/min — ABNORMAL LOW (ref >90)
Sodium: 136 mEq/L (ref 135–145)
Total Protein: 7.8 g/dL (ref 6.0–8.3)

## 2013-01-19 LAB — PRO B NATRIURETIC PEPTIDE: Pro B Natriuretic peptide (BNP): 365.2 pg/mL — ABNORMAL HIGH (ref 0–125)

## 2013-01-19 IMAGING — CR DG CHEST 2V
2 series · 2 of 2 positions shown · non-contrast
Comparison: [DATE]

CLINICAL DATA: chest pain

CHEST - 2 VIEW

[w chest lat]
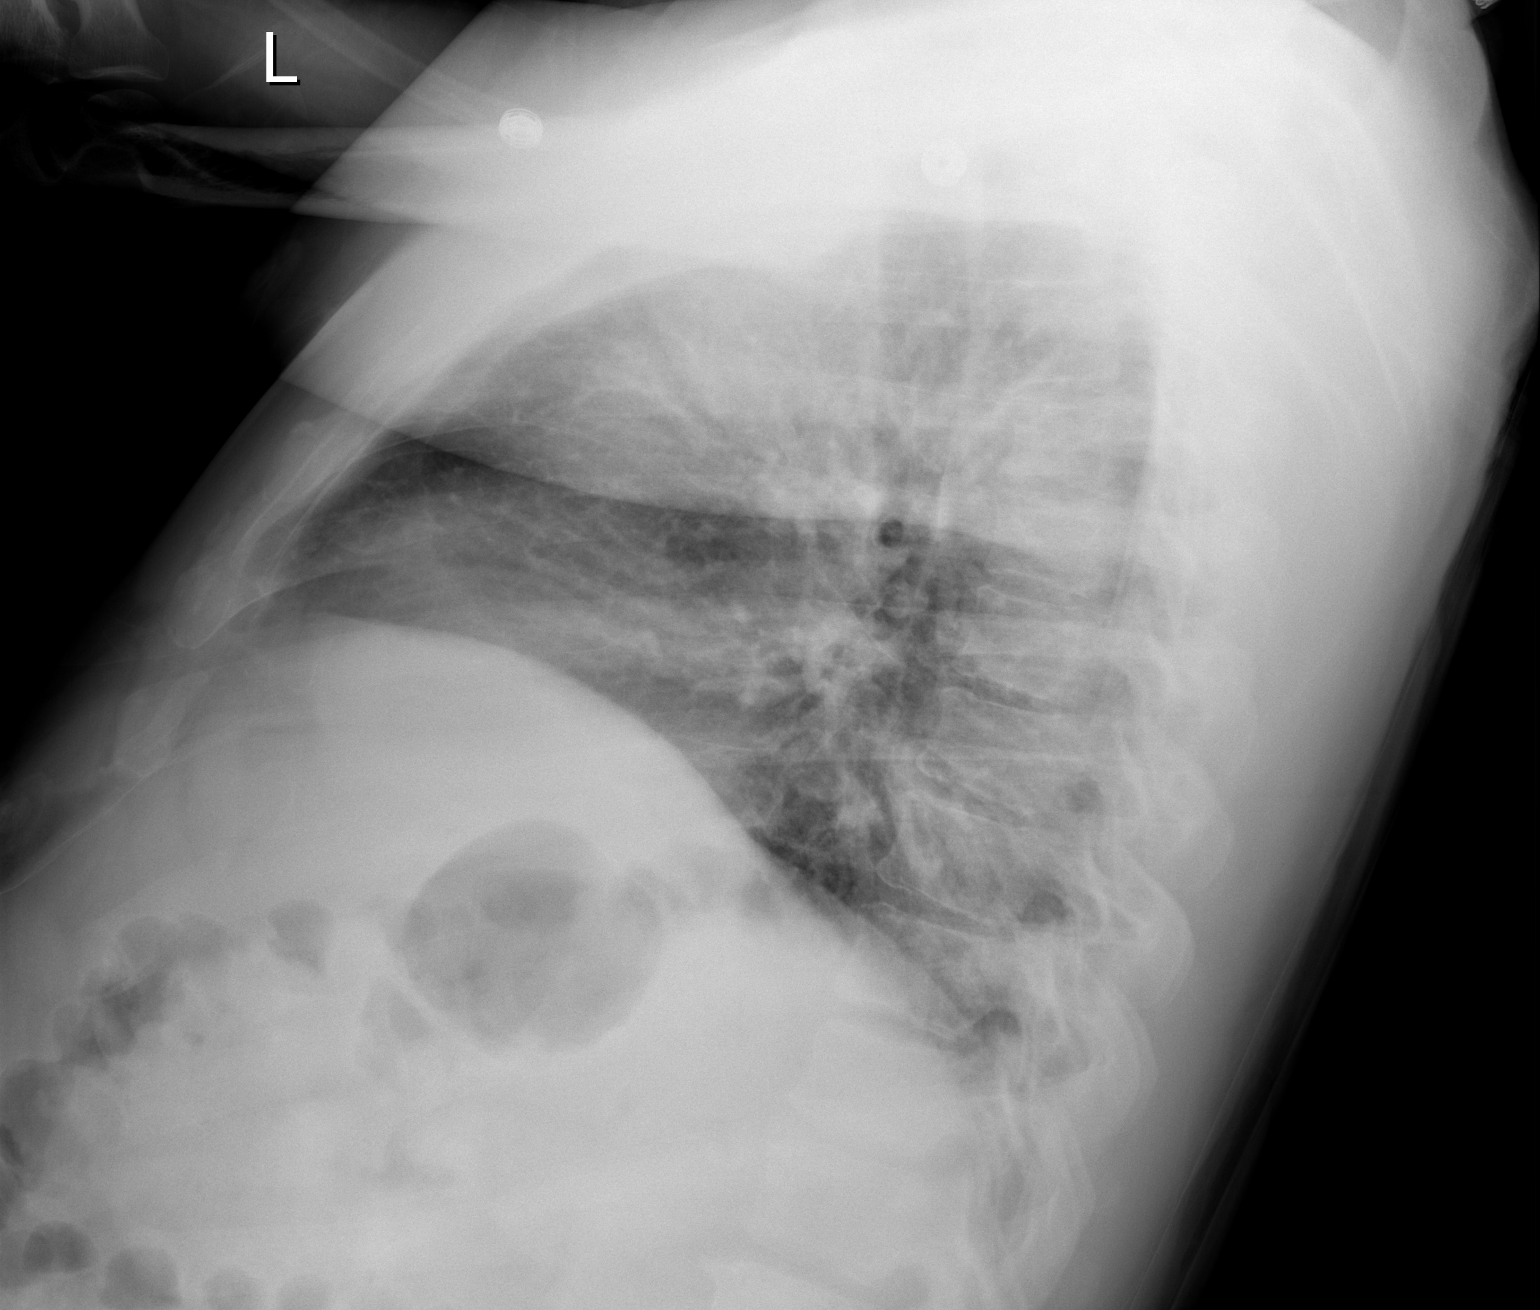

[view not recorded]
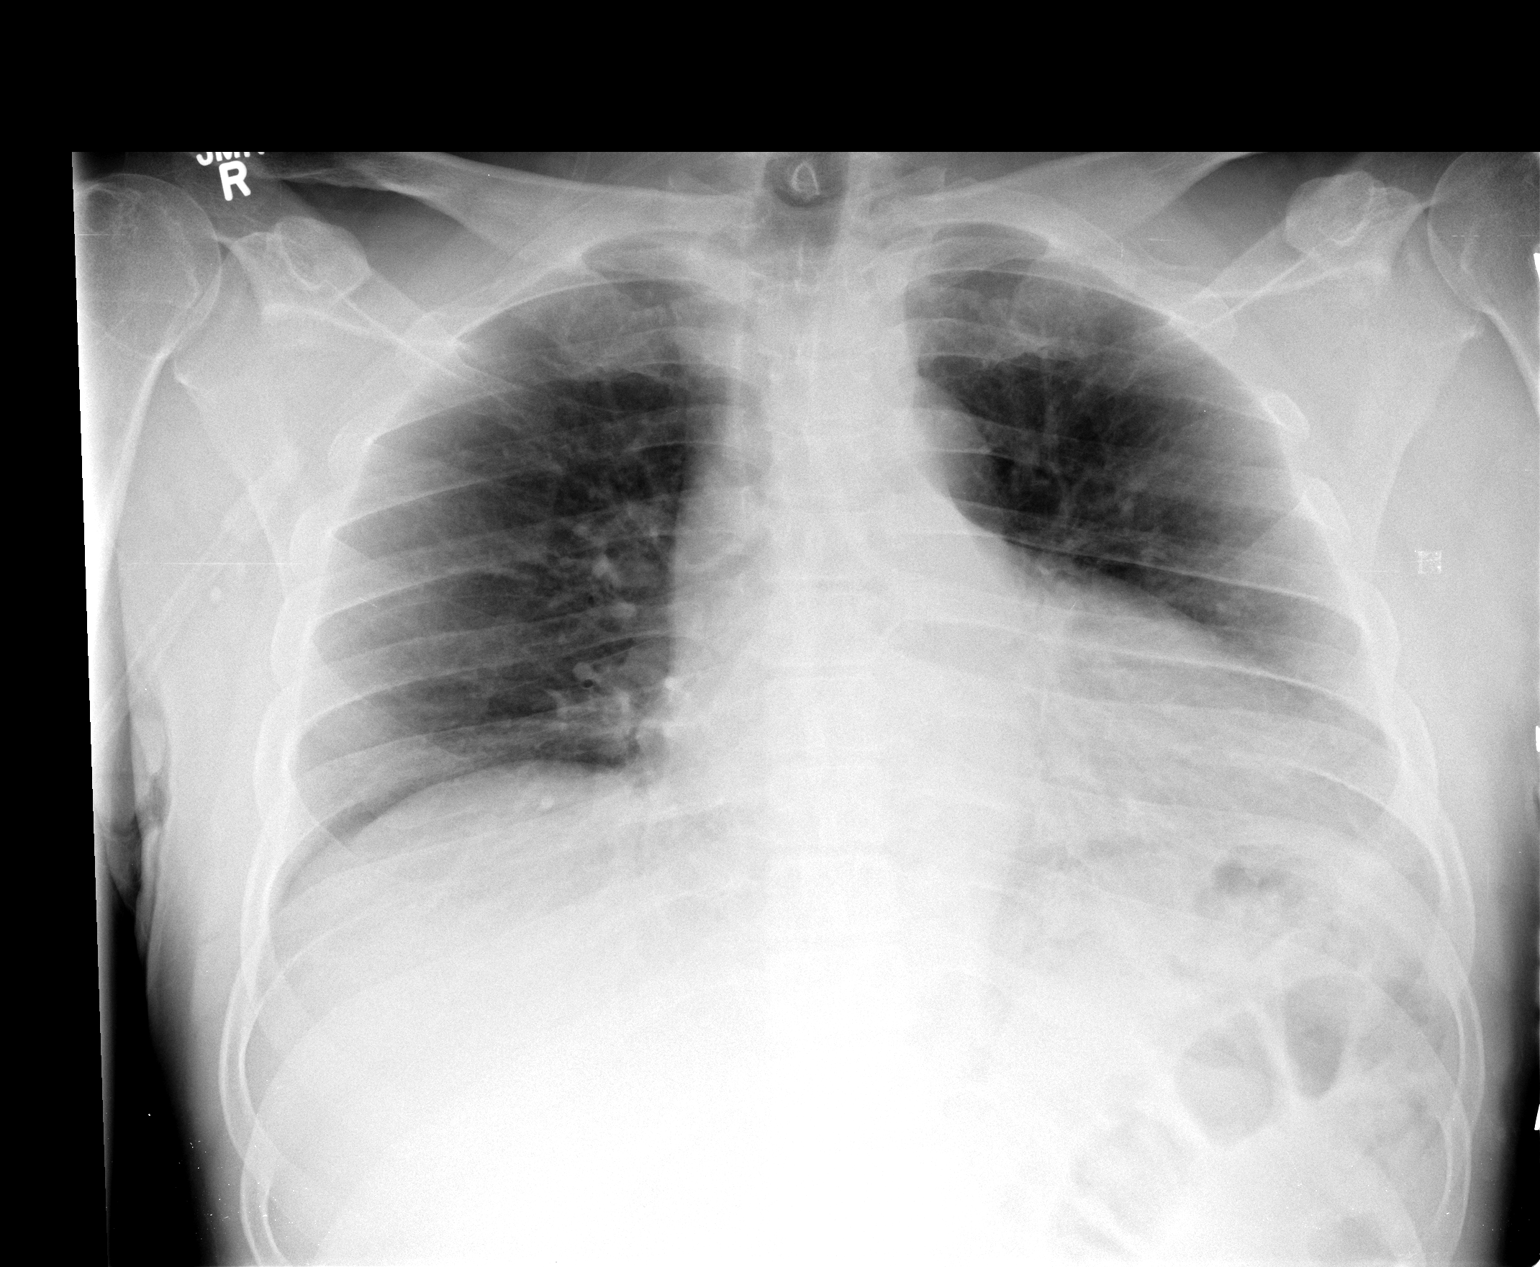

[2 of 2 positions shown; findings below may reference images not displayed]

FINDINGS: Low lung volumes accentuating the vascular markings.
Normal heart size and vascularity.  No focal pneumonia, collapse,
consolidation, effusion or pneumothorax.  Trachea midline.
IMPRESSION: Low volume exam.  No acute process by plain radiography.

## 2013-01-19 MED ORDER — ALBUTEROL SULFATE (5 MG/ML) 0.5% IN NEBU: 2.5000 mg | INHALATION_SOLUTION | RESPIRATORY_TRACT | Status: DC | PRN

## 2013-01-19 MED ORDER — METHYLPREDNISOLONE SODIUM SUCC 125 MG IJ SOLR
125.0000 mg | Freq: Once | INTRAMUSCULAR | Status: AC
  Administered 2013-01-19: 125 mg via INTRAVENOUS
  Filled 2013-01-19: qty 2

## 2013-01-19 MED ORDER — IPRATROPIUM BROMIDE 0.02 % IN SOLN
0.5000 mg | Freq: Once | RESPIRATORY_TRACT | Status: AC
  Administered 2013-01-19: 0.5 mg via RESPIRATORY_TRACT
  Filled 2013-01-19: qty 2.5

## 2013-01-19 MED ORDER — ONDANSETRON HCL 4 MG/2ML IJ SOLN: 4.0000 mg | Freq: Four times a day (QID) | INTRAMUSCULAR | Status: DC | PRN

## 2013-01-19 MED ORDER — COLCHICINE 0.6 MG PO TABS: 0.6000 mg | ORAL_TABLET | Freq: Every day | ORAL | Status: DC

## 2013-01-19 MED ORDER — ALBUTEROL (5 MG/ML) CONTINUOUS INHALATION SOLN
10.0000 mg/h | INHALATION_SOLUTION | Freq: Once | RESPIRATORY_TRACT | Status: AC
Start: 2013-01-19 — End: 2013-01-19
  Administered 2013-01-19: 10 mg/h via RESPIRATORY_TRACT
  Filled 2013-01-19: qty 20

## 2013-01-19 MED ORDER — HYDROMORPHONE HCL PF 1 MG/ML IJ SOLN
1.0000 mg | Freq: Once | INTRAMUSCULAR | Status: AC
  Administered 2013-01-19: 1 mg via INTRAVENOUS
  Filled 2013-01-19: qty 1

## 2013-01-19 MED ORDER — HYDROCODONE-ACETAMINOPHEN 5-325 MG PO TABS
1.0000 | ORAL_TABLET | ORAL | Status: DC | PRN
  Administered 2013-01-19: 2 via ORAL
  Filled 2013-01-19: qty 2

## 2013-01-19 MED ORDER — PREDNISONE 50 MG PO TABS
50.0000 mg | ORAL_TABLET | Freq: Every day | ORAL | Status: DC
  Administered 2013-01-20: 50 mg via ORAL
  Filled 2013-01-19 (×2): qty 1

## 2013-01-19 MED ORDER — SODIUM CHLORIDE 0.9 % IV SOLN
INTRAVENOUS | Status: DC
  Administered 2013-01-19: 1000 mL via INTRAVENOUS

## 2013-01-19 MED ORDER — POLYETHYLENE GLYCOL 3350 17 G PO PACK
17.0000 g | PACK | Freq: Every day | ORAL | Status: DC | PRN
  Filled 2013-01-19: qty 1

## 2013-01-19 MED ORDER — COLCHICINE 0.6 MG PO TABS
0.6000 mg | ORAL_TABLET | Freq: Every day | ORAL | Status: DC
  Administered 2013-01-20: 0.6 mg via ORAL
  Filled 2013-01-19: qty 1

## 2013-01-19 MED ORDER — ALBUTEROL SULFATE (5 MG/ML) 0.5% IN NEBU
5.0000 mg | INHALATION_SOLUTION | Freq: Once | RESPIRATORY_TRACT | Status: AC
  Administered 2013-01-19: 5 mg via RESPIRATORY_TRACT
  Filled 2013-01-19: qty 1

## 2013-01-19 MED ORDER — ALUM & MAG HYDROXIDE-SIMETH 200-200-20 MG/5ML PO SUSP: 30.0000 mL | Freq: Four times a day (QID) | ORAL | Status: DC | PRN

## 2013-01-19 MED ORDER — HEPARIN SODIUM (PORCINE) 5000 UNIT/ML IJ SOLN
5000.0000 [IU] | Freq: Three times a day (TID) | INTRAMUSCULAR | Status: DC
  Administered 2013-01-19 – 2013-01-20 (×3): 5000 [IU] via SUBCUTANEOUS
  Filled 2013-01-19 (×5): qty 1

## 2013-01-19 MED ORDER — COLCHICINE 0.6 MG PO TABS
1.2000 mg | ORAL_TABLET | Freq: Once | ORAL | Status: AC
  Administered 2013-01-19: 1.2 mg via ORAL
  Filled 2013-01-19: qty 2

## 2013-01-19 MED ORDER — GUAIFENESIN ER 600 MG PO TB12
600.0000 mg | ORAL_TABLET | Freq: Two times a day (BID) | ORAL | Status: DC
  Administered 2013-01-19 – 2013-01-20 (×2): 600 mg via ORAL
  Filled 2013-01-19 (×3): qty 1

## 2013-01-19 MED ORDER — METOPROLOL TARTRATE 25 MG PO TABS
25.0000 mg | ORAL_TABLET | Freq: Two times a day (BID) | ORAL | Status: DC
  Administered 2013-01-19 – 2013-01-20 (×2): 25 mg via ORAL
  Filled 2013-01-19 (×3): qty 1

## 2013-01-19 MED ORDER — IBUPROFEN 600 MG PO TABS
600.0000 mg | ORAL_TABLET | Freq: Four times a day (QID) | ORAL | Status: DC
  Administered 2013-01-19 – 2013-01-20 (×3): 600 mg via ORAL
  Filled 2013-01-19 (×2): qty 1
  Filled 2013-01-19: qty 3
  Filled 2013-01-19 (×2): qty 1

## 2013-01-19 MED ORDER — LEVOFLOXACIN 750 MG PO TABS
750.0000 mg | ORAL_TABLET | Freq: Every day | ORAL | Status: DC
  Administered 2013-01-19 – 2013-01-20 (×2): 750 mg via ORAL
  Filled 2013-01-19 (×2): qty 1

## 2013-01-19 MED ORDER — ZOLPIDEM TARTRATE 5 MG PO TABS
5.0000 mg | ORAL_TABLET | Freq: Every evening | ORAL | Status: DC | PRN
  Administered 2013-01-20: 5 mg via ORAL
  Filled 2013-01-19: qty 1

## 2013-01-19 MED ORDER — ONDANSETRON HCL 4 MG PO TABS: 4.0000 mg | ORAL_TABLET | Freq: Four times a day (QID) | ORAL | Status: DC | PRN

## 2013-01-19 NOTE — ED Notes (Signed)
Patient transported to X-ray 

## 2013-01-19 NOTE — ED Provider Notes (Signed)
History     CSN: [id number]  Arrival date & time 01/19/13  1542   First MD Initiated Contact with Patient 01/19/13 1552      Chief Complaint  Patient presents with  . Cough    (Consider location/radiation/quality/duration/timing/severity/associated sxs/prior treatment) Patient is a 52 y.o. male presenting with cough. The history is provided by the patient.  Cough  patient here with cough and congestion x3 weeks that progressively worse. Complains of pain in his chest only with coughing. Denies any anginal type chest pain and. No orthopnea but does have some dyspnea on exertion. He also notes bilateral knee pain as well as right ankle pain and left elbow pain similar to his prior gout. Denies any fever at the joint. He currently does not have any insurance and has not been taking any medications. His gout symptoms are worse when he walks and better with remaining still.  Past Medical History  Diagnosis Date  . Hypertension   . Gout   . Angina pectoris     No past surgical history on file.  No family history on file.  History  Substance Use Topics  . Smoking status: Current Some Day Smoker    Types: Cigarettes  . Smokeless tobacco: Not on file  . Alcohol Use: Yes      Review of Systems  Respiratory: Positive for cough.   All other systems reviewed and are negative.    Allergies  Review of patient's allergies indicates no known allergies.  Home Medications   Current Outpatient Rx  Name  Route  Sig  Dispense  Refill  . albuterol (PROVENTIL HFA;VENTOLIN HFA) 108 (90 BASE) MCG/ACT inhaler   Inhalation   Inhale 2 puffs into the lungs 2 (two) times daily as needed for wheezing or shortness of breath.         Marland Kitchen atenolol (TENORMIN) 100 MG tablet   Oral   Take 100 mg by mouth 2 (two) times daily.           BP 181/110  Pulse 94  Temp(Src) 98.3 F (36.8 C) (Oral)  Resp 17  SpO2 94%  Physical Exam  Nursing note and vitals reviewed. Constitutional: He is  oriented to person, place, and time. He appears well-developed and well-nourished.  Non-toxic appearance. No distress.  HENT:  Head: Normocephalic and atraumatic.  Eyes: Conjunctivae, EOM and lids are normal. Pupils are equal, round, and reactive to light.  Neck: Normal range of motion. Neck supple. No tracheal deviation present. No mass present.  Cardiovascular: Normal rate, regular rhythm and normal heart sounds.  Exam reveals no gallop.   No murmur heard. Pulmonary/Chest: Effort normal. No stridor. No respiratory distress. He has decreased breath sounds. He has no wheezes. He has rhonchi. He has no rales.  Abdominal: Soft. Normal appearance and bowel sounds are normal. He exhibits no distension. There is no tenderness. There is no rebound and no CVA tenderness.  Musculoskeletal: Normal range of motion. He exhibits no edema and no tenderness.  Swelling and tenderness with patellar effusion is a bilateral knees. No erythema the joint. Right ankle with tenderness and swelling without gross deformity. Left elbow with full range of motion and tenderness to palpation. Both knees also have full range of motion as well as his right ankle.  Neurological: He is alert and oriented to person, place, and time. He has normal strength. No cranial nerve deficit or sensory deficit. GCS eye subscore is 4. GCS verbal subscore is 5. GCS motor subscore is  6.  Skin: Skin is warm and dry. No abrasion and no rash noted.  Psychiatric: He has a normal mood and affect. His speech is normal and behavior is normal.    ED Course  Procedures (including critical care time)  Labs Reviewed  PRO B NATRIURETIC PEPTIDE  CBC WITH DIFFERENTIAL  COMPREHENSIVE METABOLIC PANEL   No results found.   No diagnosis found.    MDM   Date: 01/19/2013  Rate: 95  Rhythm: normal sinus rhythm  QRS Axis: normal  Intervals: normal  ST/T Wave abnormalities: normal  Conduction Disutrbances:none  Narrative Interpretation:   Old  EKG Reviewed: unchanged   8:22 PM Patient given albutero and Solu-Medro. As well as pain medication for his gout. Wheezes maintained. Repeat l albuterol treatment done. He is slightly hypoxic. He'll be admitted  CRITICAL CARE Performed by: Toy Baker   Total critical care time: 46  Critical care time was exclusive of separately billable procedures and treating other patients.  Critical care was necessary to treat or prevent imminent or life-threatening deterioration.  Critical care was time spent personally by me on the following activities: development of treatment plan with patient and/or surrogate as well as nursing, discussions with consultants, evaluation of patient's response to treatment, examination of patient, obtaining history from patient or surrogate, ordering and performing treatments and interventions, ordering and review of laboratory studies, ordering and review of radiographic studies, pulse oximetry and re-evaluation of patient's condition.        Toy Baker, MD 01/19/13 2023

## 2013-01-19 NOTE — ED Notes (Signed)
Informed Radiology that pt was done with his breathing treatment

## 2013-01-19 NOTE — H&P (Signed)
Family Medicine Teaching Service History and Physical Service Pager [phone]  Darryl Hurley is an 52 y.o. male.    PCP: Former[employer] Complaint: Shortness of breath, knee pain HPI: Darryl Hurley is a 52 year old male with PMHx of Gout, COPD, HTN who presents to the ER with one week of cough and wheezing.  He has had subjective fevers and chills and non-productive cough.  He has a history of COPD with a 40 pack year history.  He has never been admitted for COPD before.  He says over the past few years he has cut back on his smoking and now only smokes a few cigarettes a day.  He used to have an albuterol inhaler at home but no longer does because he has no insurance.   He also has pain in his knees that he says is a gout flair.  He says last night he work up with extreme pain.  He does not take any medications for his gout because he has run out of them.  Says he could not walk because of the pain.   He also does not take his blood pressure medication because he is out. Denies chest pain or palpitations.   Past Medical History  Diagnosis Date  . Hypertension   . Gout   . Angina pectoris   . COPD (chronic obstructive pulmonary disease)     No past surgical history on file.  Family History  Problem Relation Age of Onset  . Diabetes type II Mother   . Depression Father   . Asthma Brother    Social History:  reports that he has been smoking Cigarettes.  He has been smoking about 0.00 packs per day. He has never used smokeless tobacco. He reports that he drinks about 10.5 ounces of alcohol per week. He reports that he does not use illicit drugs.  Allergies: No Known Allergies  Results for orders placed during the hospital encounter of 01/19/13 (from the past 48 hour(s))  PRO B NATRIURETIC PEPTIDE     Status: Abnormal   Collection Time    01/19/13  4:04 PM      Result Value Range   Pro B Natriuretic peptide (BNP) 365.2 (*) 0 - 125 pg/mL  CBC WITH DIFFERENTIAL     Status:  Abnormal   Collection Time    01/19/13  4:04 PM      Result Value Range   WBC 11.6 (*) 4.0 - 10.5 K/uL   RBC 4.89  4.22 - 5.81 MIL/uL   Hemoglobin 14.6  13.0 - 17.0 g/dL   HCT 11.9  14.7 - 82.9 %   MCV 89.6  78.0 - 100.0 fL   MCH 29.9  26.0 - 34.0 pg   MCHC 33.3  30.0 - 36.0 g/dL   RDW 56.2 (*) 13.0 - 86.5 %   Platelets 259  150 - 400 K/uL   Neutrophils Relative 75  43 - 77 %   Neutro Abs 8.6 (*) 1.7 - 7.7 K/uL   Lymphocytes Relative 13  12 - 46 %   Lymphs Abs 1.5  0.7 - 4.0 K/uL   Monocytes Relative 12  3 - 12 %   Monocytes Absolute 1.4 (*) 0.1 - 1.0 K/uL   Eosinophils Relative 0  0 - 5 %   Eosinophils Absolute 0.0  0.0 - 0.7 K/uL   Basophils Relative 0  0 - 1 %   Basophils Absolute 0.0  0.0 - 0.1 K/uL  COMPREHENSIVE  METABOLIC PANEL     Status: Abnormal   Collection Time    01/19/13  4:04 PM      Result Value Range   Sodium 136  135 - 145 mEq/L   Potassium 3.7  3.5 - 5.1 mEq/L   Chloride 98  96 - 112 mEq/L   CO2 24  19 - 32 mEq/L   Glucose, Bld 136 (*) 70 - 99 mg/dL   BUN 12  6 - 23 mg/dL   Creatinine, Ser 1.61  0.50 - 1.35 mg/dL   Calcium 9.5  8.4 - 09.6 mg/dL   Total Protein 7.8  6.0 - 8.3 g/dL   Albumin 3.1 (*) 3.5 - 5.2 g/dL   AST 30  0 - 37 U/L   ALT 17  0 - 53 U/L   Alkaline Phosphatase 64  39 - 117 U/L   Total Bilirubin 0.7  0.3 - 1.2 mg/dL   GFR calc non Af Amer 64 (*) >90 mL/min   GFR calc Af Amer 74 (*) >90 mL/min   Comment:            The eGFR has been calculated     using the CKD EPI equation.     This calculation has not been     validated in all clinical     situations.     eGFR's persistently     <90 mL/min signify     possible Chronic Kidney Disease.  POCT I-STAT TROPONIN I     Status: None   Collection Time    01/19/13  4:28 PM      Result Value Range   Troponin i, poc 0.00  0.00 - 0.08 ng/mL   Comment 3            Comment: Due to the release kinetics of cTnI,     a negative result within the first hours     of the onset of symptoms does  not rule out     myocardial infarction with certainty.     If myocardial infarction is still suspected,     repeat the test at appropriate intervals.   Dg Chest 2 View  01/19/2013  *RADIOLOGY REPORT*  Clinical Data:  chest pain  CHEST - 2 VIEW  Comparison: 12/29/2012  Findings: Low lung volumes accentuating the vascular markings. Normal heart size and vascularity.  No focal pneumonia, collapse, consolidation, effusion or pneumothorax.  Trachea midline.  IMPRESSION: Low volume exam.  No acute process by plain radiography.   Original Report Authenticated By: Judie Petit. Shick, M.D.     ROS: Negative except HPI  Blood pressure 138/88, pulse 90, temperature 98.3 F (36.8 C), temperature source Oral, resp. rate 22, SpO2 96.00%. General appearance: alert and cooperative, nasal cannula in place, seems somewhat short of breath with full sentances Eyes: PERRL, EOMIT Throat: oral mucosa moist Neck: no JVD and supple, symmetrical, trachea midline Back: symmetric, no curvature. ROM normal. No CVA tenderness. Lungs: diffuse wheezing with good air movement Heart: regular rate and rhythm, S1, S2 normal, no murmur, click, rub or gallop Abdomen: soft, non-tender; bowel sounds normal; no masses,  no organomegaly Extremities: Patient's knees are warm to the touch and he is extremely painful with any touch.  There is no erythema, but there is mild effusion bilaterally.  Pulses: 2+ and symmetric  Assessment/Plan Darryl Hurley is a 52 y.o. male with PMHx of COPD, Gout, and HTN who presents with COPD exacerbation and acute gout flair:   #COPD  exacerbation: s/p solumedrol and albuterol in ER with some improvement - Levofloxacin, PO prednisone for COPD exacerbation, Albuterol PRN - Patient does have mild leukocytosis will monitor for other signs of bacterial infection/PNA.  - Oxygen PRN  #Gout: Acute flair, will give colchicine 1.2 mg treatment dose then start daily 0.6 mg - Schedule Ibuprofen, will need to monitor  kidney function - Vicodin PRN - Ice packs to knees  #HTN: Elevated, pt off medications due to no insurance.  - was on Atenolol, will start metoprolol and monitor BP  # FEN/GI:Saline lock IV, heart healthy diet, monitor electrolytes.    # Code Status: Full Code  #PPx: SQ Heparin  #Disposition: Pending Clinical Improvement  Bennie Scaff 01/19/2013, 9:08 PM

## 2013-01-19 NOTE — ED Notes (Signed)
Pt states that over the last 3 weeks he has been having a increase in SOB and CP with cough.  Pt was dx'd with PNA 3 weeks ago.

## 2013-01-20 ENCOUNTER — Observation Stay (HOSPITAL_COMMUNITY): Payer: Medicaid Other

## 2013-01-20 ENCOUNTER — Encounter (HOSPITAL_COMMUNITY): Payer: Self-pay | Admitting: *Deleted

## 2013-01-20 DIAGNOSIS — J441 Chronic obstructive pulmonary disease with (acute) exacerbation: Secondary | ICD-10-CM | POA: Diagnosis present

## 2013-01-20 DIAGNOSIS — M109 Gout, unspecified: Secondary | ICD-10-CM | POA: Diagnosis present

## 2013-01-20 LAB — BASIC METABOLIC PANEL
BUN: 18 mg/dL (ref 6–23)
CO2: 23 mEq/L (ref 19–32)
Chloride: 98 mEq/L (ref 96–112)
GFR calc non Af Amer: 70 mL/min — ABNORMAL LOW (ref >90)
Glucose, Bld: 435 mg/dL — ABNORMAL HIGH (ref 70–99)
Potassium: 3.8 mEq/L (ref 3.5–5.1)
Sodium: 136 mEq/L (ref 135–145)

## 2013-01-20 LAB — GLUCOSE, CAPILLARY
Glucose-Capillary: 337 mg/dL — ABNORMAL HIGH (ref 70–99)
Glucose-Capillary: 210 mg/dL — ABNORMAL HIGH (ref 70–99)

## 2013-01-20 LAB — CBC
HCT: 37.4 % — ABNORMAL LOW (ref 39.0–52.0)
Hemoglobin: 12.5 g/dL — ABNORMAL LOW (ref 13.0–17.0)
MCH: 29.8 pg (ref 26.0–34.0)
MCHC: 33.4 g/dL (ref 30.0–36.0)
MCV: 89.3 fL (ref 78.0–100.0)
RBC: 4.19 MIL/uL — ABNORMAL LOW (ref 4.22–5.81)

## 2013-01-20 IMAGING — CR DG KNEE 1-2V*L*
3 series · 3 of 3 positions shown · non-contrast
Comparison: None.

CLINICAL DATA: Chronic bilateral knee pain

LEFT KNEE - 1-2 VIEW

[x knee ap left]
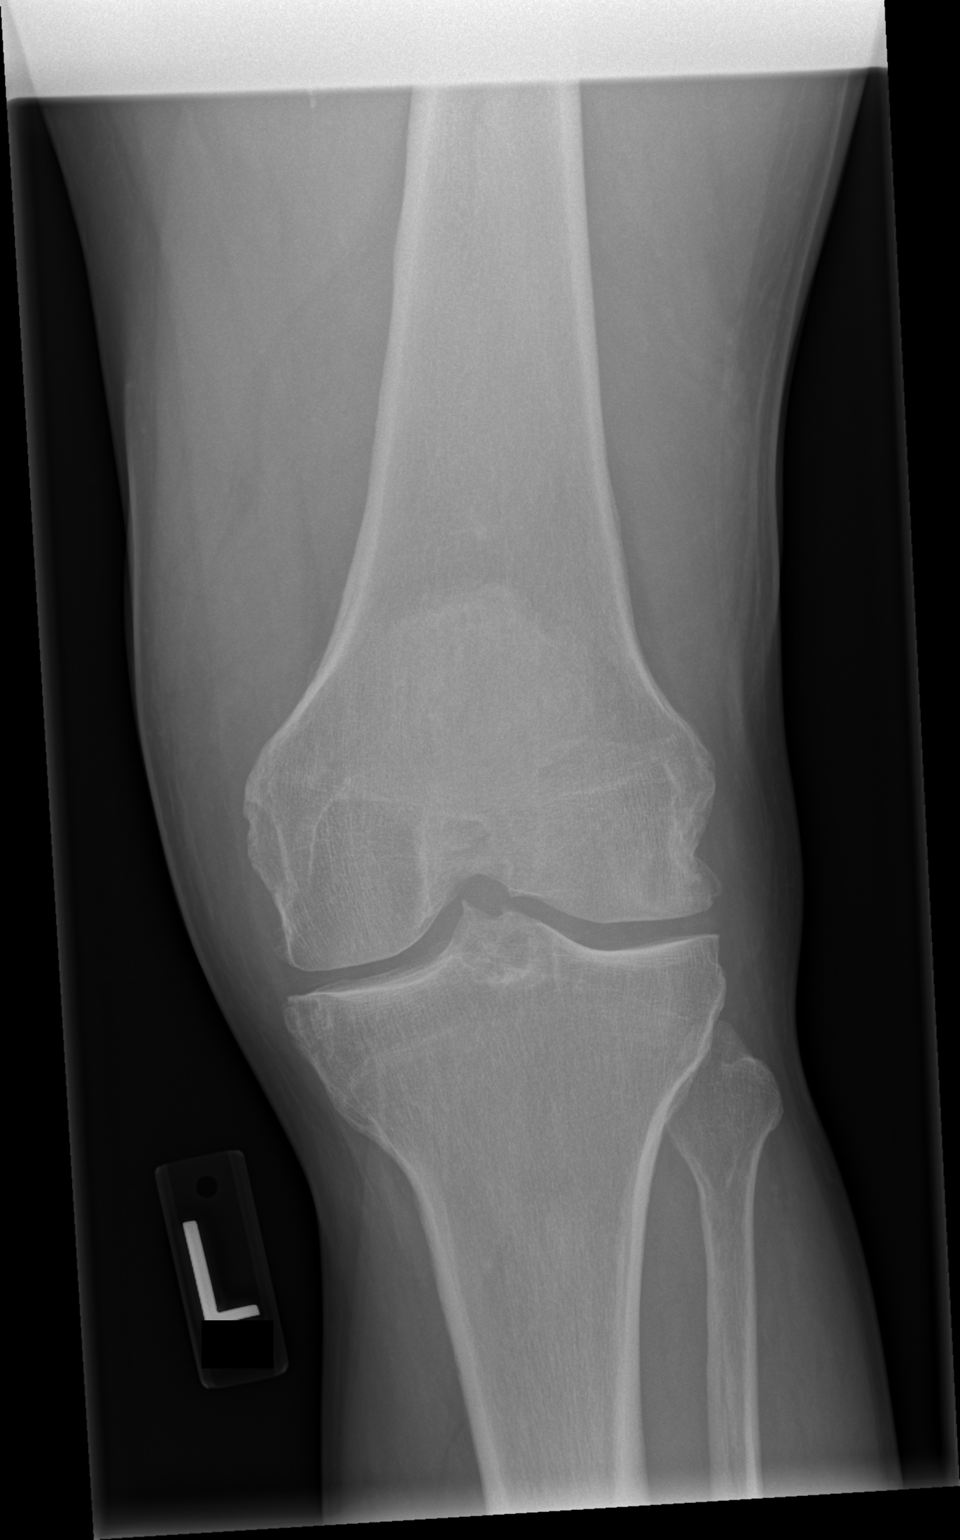

[x knee lat left]
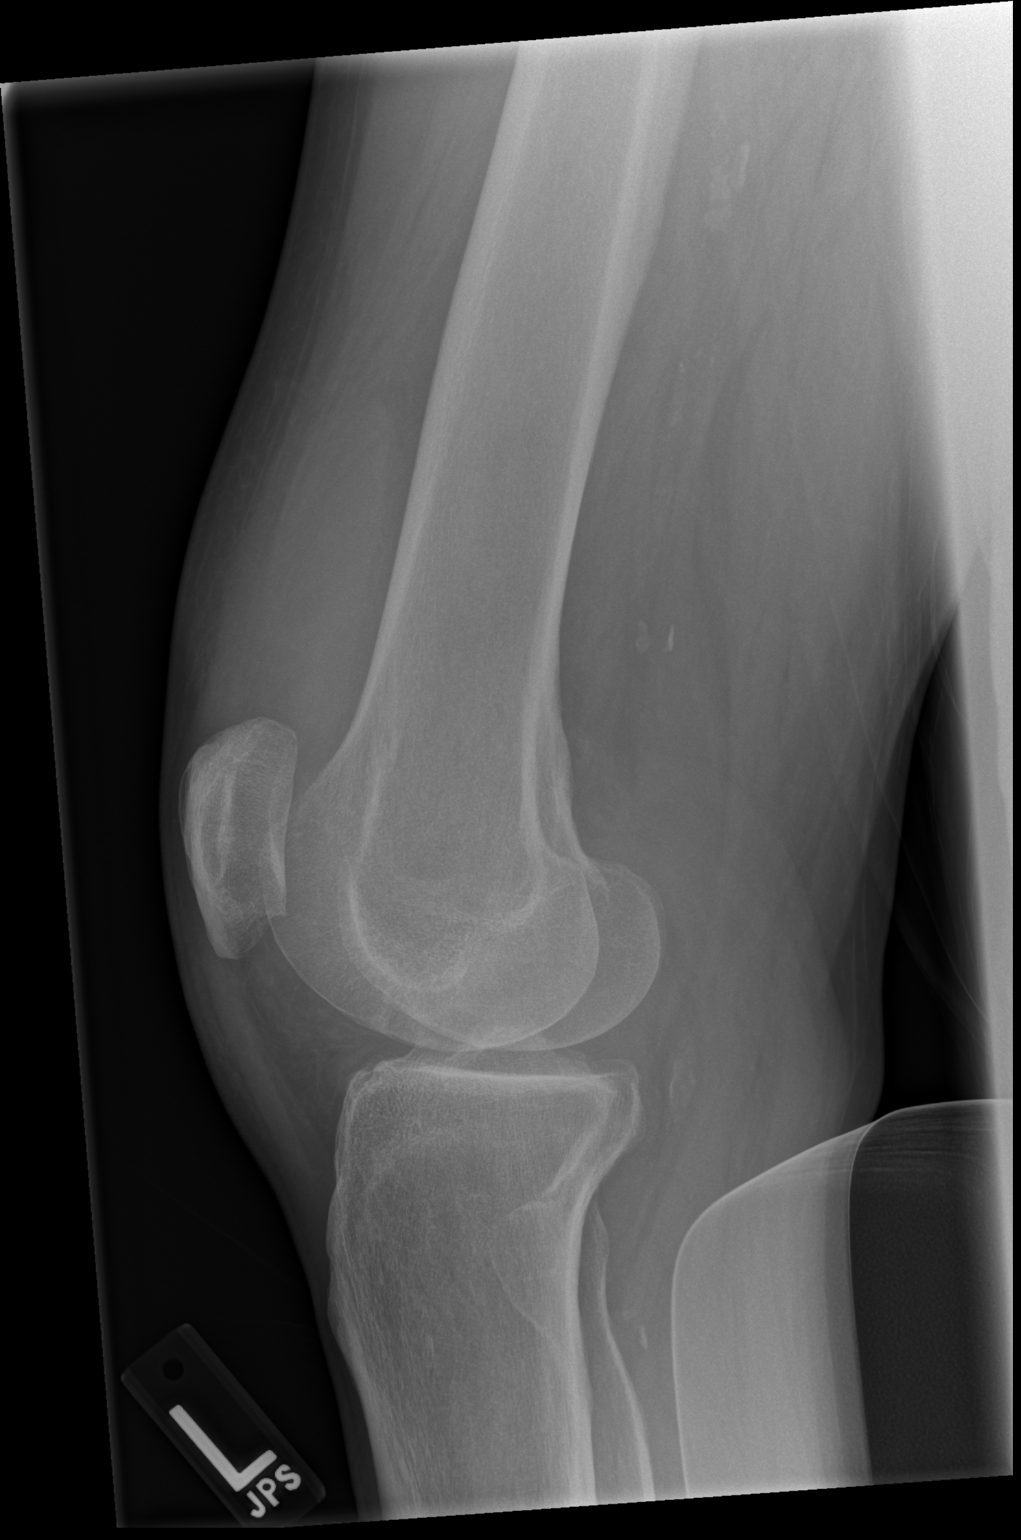

[t knee lat left]
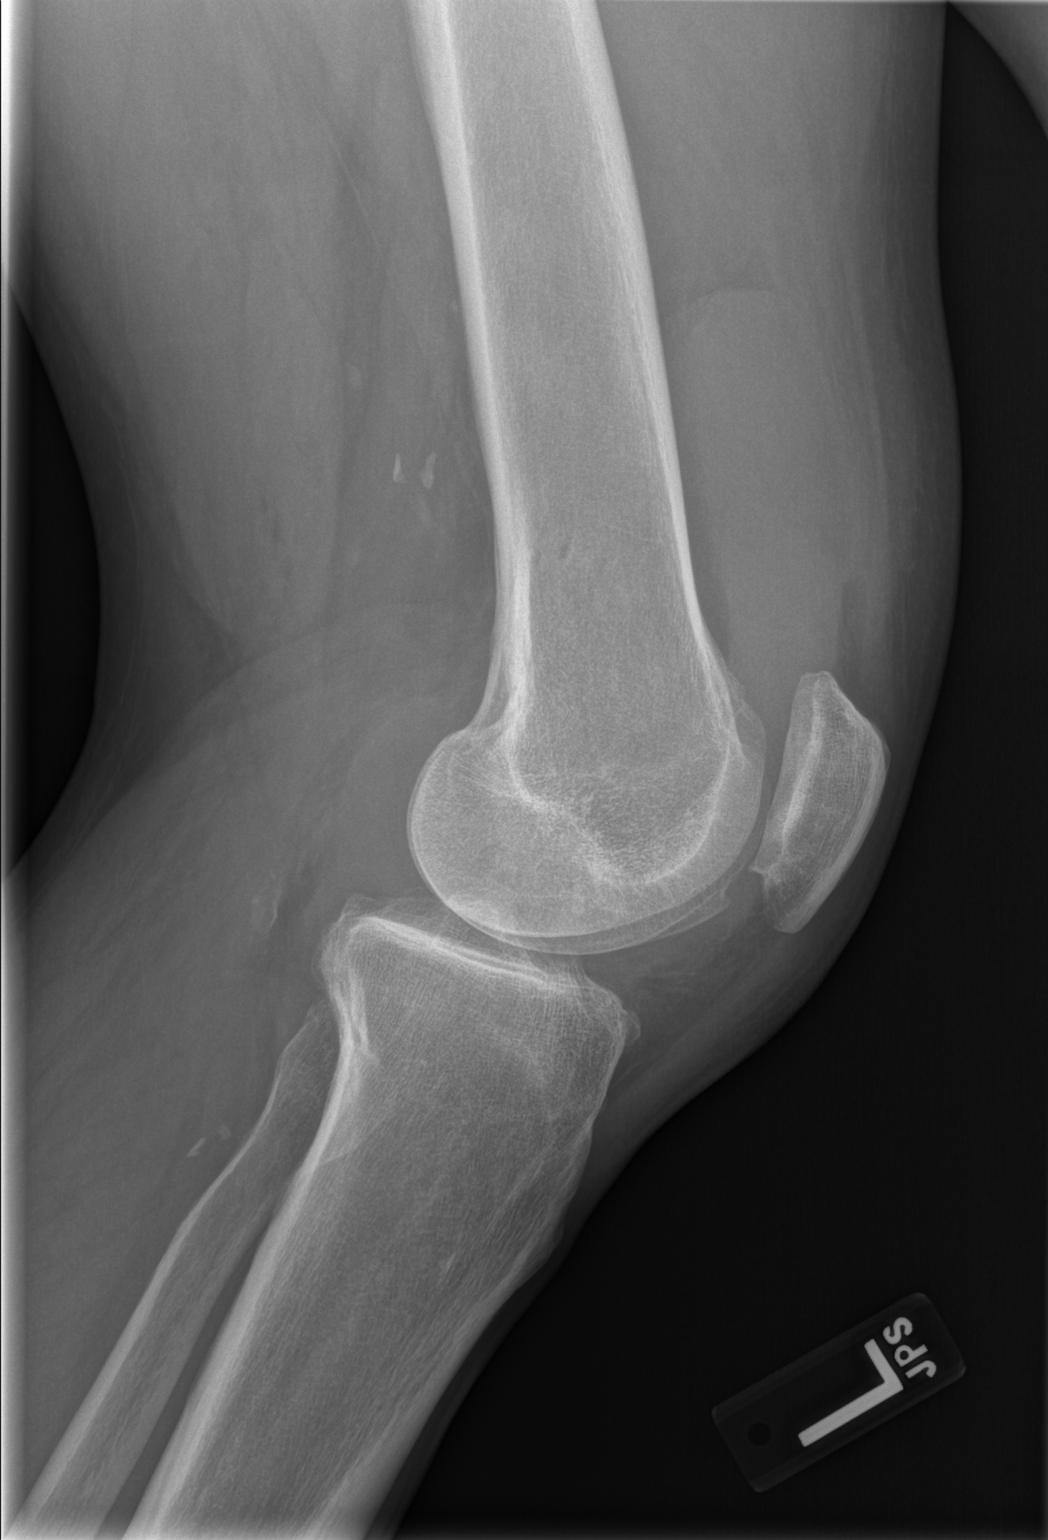

[3 of 3 positions shown; findings below may reference images not displayed]

FINDINGS: No fracture or dislocation is seen.

Mild tricompartmental degenerative changes with possible
chondrocalcinosis.

Large suprasellar knee joint effusion.
IMPRESSION: No fracture or dislocation is seen.

Mild degenerative changes.

Large suprapatellar knee joint effusion.

## 2013-01-20 MED ORDER — LEVOFLOXACIN 750 MG PO TABS: 750.0000 mg | ORAL_TABLET | Freq: Every day | ORAL | Status: DC

## 2013-01-20 MED ORDER — LOSARTAN POTASSIUM 50 MG PO TABS: 50.0000 mg | ORAL_TABLET | Freq: Every day | ORAL | Status: DC

## 2013-01-20 MED ORDER — PREDNISONE 50 MG PO TABS: ORAL_TABLET | ORAL | Status: DC

## 2013-01-20 MED ORDER — INSULIN ASPART 100 UNIT/ML ~~LOC~~ SOLN: 0.0000 [IU] | Freq: Every day | SUBCUTANEOUS | Status: DC

## 2013-01-20 MED ORDER — COLCHICINE 0.6 MG PO TABS: 0.6000 mg | ORAL_TABLET | Freq: Every day | ORAL | Status: DC

## 2013-01-20 MED ORDER — IBUPROFEN 600 MG PO TABS: 600.0000 mg | ORAL_TABLET | Freq: Four times a day (QID) | ORAL | Status: DC | PRN

## 2013-01-20 MED ORDER — INSULIN ASPART 100 UNIT/ML ~~LOC~~ SOLN
0.0000 [IU] | Freq: Three times a day (TID) | SUBCUTANEOUS | Status: DC
  Administered 2013-01-20: 3 [IU] via SUBCUTANEOUS

## 2013-01-20 MED ORDER — ALBUTEROL SULFATE HFA 108 (90 BASE) MCG/ACT IN AERS: 2.0000 | INHALATION_SPRAY | Freq: Four times a day (QID) | RESPIRATORY_TRACT | Status: DC

## 2013-01-20 NOTE — Progress Notes (Signed)
Family Medicine Teaching Service Daily Progress Note Service Page: [phone]  Subjective: Feels like he's doing better this morning. States is breathing better and his knees feel better.  Objective: Temp:  [97.3 F (36.3 C)-98.3 F (36.8 C)] 97.3 F (36.3 C) (02/18 0617) Pulse Rate:  [88-106] 97 (02/18 0617) Resp:  [16-28] 20 (02/18 0617) BP: (123-181)/(67-110) 127/67 mmHg (02/18 0617) SpO2:  [89 %-99 %] 96 % (02/18 0833) Weight:  [186 lb 4.6 oz (84.5 kg)] 186 lb 4.6 oz (84.5 kg) (02/18 0603) Exam: General: no acute distress Cardiovascular: rrr, no mrg Respiratory: CTAB, no wheezes or crackles Abdomen: soft, NT, ND Extremities: bilateral suprapatellar effusion, minimally TTP  CBC BMET   Recent Labs Lab 01/19/13 1604 01/20/13 0525  WBC 11.6* 15.1*  HGB 14.6 12.5*  HCT 43.8 37.4*  PLT 259 269    Recent Labs Lab 01/19/13 1604 01/20/13 0525  NA 136 136  K 3.7 3.8  CL 98 98  CO2 24 23  BUN 12 18  CREATININE 1.28 1.18  GLUCOSE 136* 435*  CALCIUM 9.5 9.4     Imaging/Diagnostic Tests: CXR: no acute processes  Assessment/Plan: Darryl Hurley is a 52 y.o. male with PMHx of COPD, Gout, and HTN who presents with COPD exacerbation and acute gout flair:   #COPD exacerbation: s/p solumedrol and albuterol in ER with some improvement  - Levofloxacin, PO prednisone for COPD exacerbation, Albuterol PRN  - Patient does have mild leukocytosis will monitor for other signs of bacterial infection/PNA. No evidence on exam. - Oxygen PRN-on 2 L - plan to walk patient today to see if tolerates off O2   #Gout: Acute flare, improved - continue daily colchicine 0.6 mg  - Schedule Ibuprofen, will need to monitor kidney function  - Vicodin PRN  - Ice packs to knees - PT/OT consult - will need outpatient addition of allopurinol - will f/u on knee film reads   #HTN: Elevated, pt off medications due to no insurance.  - was on Atenolol, started metoprolol and monitor BP - will place  on cozaar for BP, d/c metoprolol   #Elevated blood glucose: to 435 this morning -will start SSI -obtain A1c and monitor CBGs  # FEN/GI: Saline lock IV, heart healthy diet, monitor electrolytes.  # Code Status: Full Code  #PPx: SQ Heparin  #Disposition: Pending PT/OT eval and weaning of O2   Marikay Alar, MD 01/20/2013, 9:32 AM

## 2013-01-20 NOTE — Evaluation (Signed)
Physical Therapy Evaluation Patient Details Name: SHYLO DILLENBECK MRN: [id number] DOB: [birth year] Today's Date: 01/20/2013 Time: 1478-2956 PT Time Calculation (min): 21 min  PT Assessment / Plan / Recommendation Clinical Impression  Pt. is a 52 y/o male who presents to [facility] with COPD and gout exacerbation.  Anticipate pt. is near baseline for mobility and will be safe to d/c home with girlfriend.    PT Assessment  Patient needs continued PT services    Follow Up Recommendations  No PT follow up    Does the patient have the potential to tolerate intense rehabilitation      Barriers to Discharge None      Equipment Recommendations  None recommended by PT;Other (comment) (pt. has SPC at home)    Recommendations for Other Services     Frequency Min 3X/week    Precautions / Restrictions Precautions Precautions: None Restrictions Weight Bearing Restrictions: No   Pertinent Vitals/Pain Pt. C/o minimal pain in R knee; did not rate      Mobility  Bed Mobility Bed Mobility: Supine to Sit Supine to Sit: 7: Independent Transfers Transfers: Sit to Stand;Stand to Sit Sit to Stand: 7: Independent;With upper extremity assist;From bed Stand to Sit: 5: Supervision;With upper extremity assist;To chair/3-in-1;Other (comment) (Cues for hand placement) Ambulation/Gait Ambulation/Gait Assistance: 5: Supervision;6: Modified independent (Device/Increase time) Ambulation Distance (Feet): 200 Feet Assistive device: Rolling walker;Other (Comment) (IV pole) Ambulation/Gait Assistance Details: Ambulated with IV pole to simulate SPC, transitioned to RW.  Pt. with decreased reliance on RW, anticipate pt. can safely ambulate with SPC Gait Pattern: Within Functional Limits Stairs: No Wheelchair Mobility Wheelchair Mobility: No    Exercises     PT Diagnosis: Difficulty walking  PT Problem List: Decreased mobility;Decreased knowledge of use of DME PT Treatment Interventions: Gait training;Stair  training;Therapeutic activities;Therapeutic exercise;Functional mobility training;Balance training;Patient/family education;DME instruction   PT Goals Acute Rehab PT Goals PT Goal Formulation: With patient Time For Goal Achievement: 01/27/13 Potential to Achieve Goals: Good Pt will Ambulate: >150 feet;with modified independence;with cane PT Goal: Ambulate - Progress: Goal set today Pt will Go Up / Down Stairs: 1-2 stairs;with modified independence;with cane PT Goal: Up/Down Stairs - Progress: Goal set today  Visit Information  Last PT Received On: 01/20/13 Assistance Needed: +1    Subjective Data  Subjective: "I uses a cane at home." Patient Stated Goal: to go home   Prior Functioning  Home Living Lives With: Significant other Available Help at Discharge: Friend(s);Available 24 hours/day Type of Home: Apartment Home Access: Stairs to enter Entrance Stairs-Number of Steps: 2 Entrance Stairs-Rails: None Home Layout: One level;Full bath on main level;Able to live on main level with bedroom/bathroom Bathroom Shower/Tub: Tub/shower unit;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: Standard Bathroom Accessibility: Yes How Accessible: Accessible via walker Home Adaptive Equipment: Straight cane;Crutches Prior Function Level of Independence: Independent with assistive device(s) Able to Take Stairs?: Yes Driving: Yes Vocation: On disability Communication Communication: No difficulties    Cognition  Cognition Overall Cognitive Status: Appears within functional limits for tasks assessed/performed Arousal/Alertness: Awake/alert Orientation Level: Appears intact for tasks assessed Behavior During Session: [facility] for tasks performed    Extremity/Trunk Assessment Right Lower Extremity Assessment RLE ROM/Strength/Tone: Within functional levels RLE Coordination: WFL - gross/fine motor Left Lower Extremity Assessment LLE ROM/Strength/Tone: Within functional levels LLE Coordination: WFL - gross/fine  motor Trunk Assessment Trunk Assessment: Normal   Balance Balance Balance Assessed: Yes Static Standing Balance Static Standing - Level of Assistance: 7: Independent  End of Session PT - End of Session  Equipment Utilized During Treatment: Gait belt;Other (comment) (O2 sats > 96% during session) Activity Tolerance: Patient tolerated treatment well Patient left: in chair;with call bell/phone within reach Nurse Communication: Mobility status  GP Functional Assessment Tool Used: clinical judgement Functional Limitation: Mobility: Walking and moving around Mobility: Walking and Moving Around Current Status (M0102): At least 1 percent but less than 20 percent impaired, limited or restricted Mobility: Walking and Moving Around Goal Status [id number]): 0 percent impaired, limited or restricted   Feltis, Makenna Macaluso K 01/20/2013, 11:52 AM  Nicki Reaper. Feltis, PT, DPT [phone]

## 2013-01-20 NOTE — Discharge Summary (Signed)
Physician Discharge Summary  Patient ID: Darryl Hurley MRN: [id number] DOB: Apr 07, 1961 Age: 52 y.o.  Admit date: 01/19/2013 Discharge date: 01/20/2013 Admitting Physician: Sanjuana Letters, MD  PCP: none, has f/u scheduled with Dr. Laural Benes  Consultants: none   Discharge Diagnosis:  Principal Problem:   COPD exacerbation Active Problems:   Essential hypertension, benign   Gout flare    Hospital Course Darryl Hurley is a 52 y.o. male with PMHx of COPD, Gout, and HTN who presents with COPD exacerbation and acute gout flair:   #COPD exacerbation: patient presented with one week of cough and wheezing with subjective fevers and chills. In the ED he received solumedrol and albuterol with some improvement. He was started on daily prednisone, levaquin, and albuterol. He initially required O2 at 2 L [place], but this was weaned to room air the morning following admission. His shortness of breath was improved on discharge and the patient had no oxygen requirement.  #Gout: patient additionally with pain in his knees that started last night. Stated pain was so bad he couldn't walk. Additionally patient had swelling in bilateral legs in the suprapatellar region. Started on daily colchicine, ibuprofen, and prn vicodin. Used ice packs on knees. Had improvement of pain at time of discharge. PT was consulted and recommended no PT follow-up. Knee films were obtained that revealed mild degenerative changes.   #HTN: Elevated to 181/110 in the ED, pt off medications due to no insurance. Previously on atenolol. Initially started on metoprolol and discharged on cozaar. BP stable at time of discharge.  #Elevated blood glucose: to 435 the morning of discharge. Placed on SSI. A1c obtained and pending at time of discharge.   Problem List 1. COPD exacerbation 2. Gout flare 3. HTN 4. Elevated blood glucose         Discharge PE   Filed Vitals:   01/20/13 0617  BP: 127/67  Pulse: 97  Temp: 97.3 F (36.3  C)  Resp: 20   General: no acute distress  Cardiovascular: rrr, no mrg  Respiratory: CTAB, no wheezes or crackles  Abdomen: soft, NT, ND  Extremities: bilateral suprapatellar effusion, minimally TTP   Procedures/Imaging:  Dg Chest 2 View  01/19/2013  IMPRESSION: Low volume exam.  No acute process by plain radiography.   Original Report Authenticated By: Judie Petit. Shick, M.D.    Dg Knee 1-2 Views Left  01/20/2013  IMPRESSION: No fracture or dislocation is seen.  Mild degenerative changes.  Large suprapatellar knee joint effusion.   Original Report Authenticated By: Charline Bills, M.D.    Labs  CBC  Recent Labs Lab 01/19/13 1604 01/20/13 0525  WBC 11.6* 15.1*  HGB 14.6 12.5*  HCT 43.8 37.4*  PLT 259 269   BMET  Recent Labs Lab 01/19/13 1604 01/20/13 0525  NA 136 136  K 3.7 3.8  CL 98 98  CO2 24 23  BUN 12 18  CREATININE 1.28 1.18  CALCIUM 9.5 9.4  PROT 7.8  --   BILITOT 0.7  --   ALKPHOS 64  --   ALT 17  --   AST 30  --   GLUCOSE 136* 435*   Results for orders placed during the hospital encounter of 01/19/13 (from the past 72 hour(s))  PRO B NATRIURETIC PEPTIDE     Status: Abnormal   Collection Time    01/19/13  4:04 PM      Result Value Range   Pro B Natriuretic peptide (BNP) 365.2 (*) 0 - 125 pg/mL  CBC WITH DIFFERENTIAL     Status: Abnormal   Collection Time    01/19/13  4:04 PM      Result Value Range   WBC 11.6 (*) 4.0 - 10.5 K/uL   RBC 4.89  4.22 - 5.81 MIL/uL   Hemoglobin 14.6  13.0 - 17.0 g/dL   HCT 78.2  95.6 - 21.3 %   MCV 89.6  78.0 - 100.0 fL   MCH 29.9  26.0 - 34.0 pg   MCHC 33.3  30.0 - 36.0 g/dL   RDW 08.6 (*) 57.8 - 46.9 %   Platelets 259  150 - 400 K/uL   Neutrophils Relative 75  43 - 77 %   Neutro Abs 8.6 (*) 1.7 - 7.7 K/uL   Lymphocytes Relative 13  12 - 46 %   Lymphs Abs 1.5  0.7 - 4.0 K/uL   Monocytes Relative 12  3 - 12 %   Monocytes Absolute 1.4 (*) 0.1 - 1.0 K/uL   Eosinophils Relative 0  0 - 5 %   Eosinophils Absolute 0.0   0.0 - 0.7 K/uL   Basophils Relative 0  0 - 1 %   Basophils Absolute 0.0  0.0 - 0.1 K/uL  COMPREHENSIVE METABOLIC PANEL     Status: Abnormal   Collection Time    01/19/13  4:04 PM      Result Value Range   Sodium 136  135 - 145 mEq/L   Potassium 3.7  3.5 - 5.1 mEq/L   Chloride 98  96 - 112 mEq/L   CO2 24  19 - 32 mEq/L   Glucose, Bld 136 (*) 70 - 99 mg/dL   BUN 12  6 - 23 mg/dL   Creatinine, Ser 6.29  0.50 - 1.35 mg/dL   Calcium 9.5  8.4 - 52.8 mg/dL   Total Protein 7.8  6.0 - 8.3 g/dL   Albumin 3.1 (*) 3.5 - 5.2 g/dL   AST 30  0 - 37 U/L   ALT 17  0 - 53 U/L   Alkaline Phosphatase 64  39 - 117 U/L   Total Bilirubin 0.7  0.3 - 1.2 mg/dL   GFR calc non Af Amer 64 (*) >90 mL/min   GFR calc Af Amer 74 (*) >90 mL/min   Comment:            The eGFR has been calculated     using the CKD EPI equation.     This calculation has not been     validated in all clinical     situations.     eGFR's persistently     <90 mL/min signify     possible Chronic Kidney Disease.  POCT I-STAT TROPONIN I     Status: None   Collection Time    01/19/13  4:28 PM      Result Value Range   Troponin i, poc 0.00  0.00 - 0.08 ng/mL   Comment 3            Comment: Due to the release kinetics of cTnI,     a negative result within the first hours     of the onset of symptoms does not rule out     myocardial infarction with certainty.     If myocardial infarction is still suspected,     repeat the test at appropriate intervals.  BASIC METABOLIC PANEL     Status: Abnormal   Collection Time    01/20/13  5:25 AM  Result Value Range   Sodium 136  135 - 145 mEq/L   Potassium 3.8  3.5 - 5.1 mEq/L   Chloride 98  96 - 112 mEq/L   CO2 23  19 - 32 mEq/L   Glucose, Bld 435 (*) 70 - 99 mg/dL   BUN 18  6 - 23 mg/dL   Creatinine, Ser 0.98  0.50 - 1.35 mg/dL   Calcium 9.4  8.4 - 11.9 mg/dL   GFR calc non Af Amer 70 (*) >90 mL/min   GFR calc Af Amer 82 (*) >90 mL/min   Comment:            The eGFR has been  calculated     using the CKD EPI equation.     This calculation has not been     validated in all clinical     situations.     eGFR's persistently     <90 mL/min signify     possible Chronic Kidney Disease.  CBC     Status: Abnormal   Collection Time    01/20/13  5:25 AM      Result Value Range   WBC 15.1 (*) 4.0 - 10.5 K/uL   RBC 4.19 (*) 4.22 - 5.81 MIL/uL   Hemoglobin 12.5 (*) 13.0 - 17.0 g/dL   HCT 14.7 (*) 82.9 - 56.2 %   MCV 89.3  78.0 - 100.0 fL   MCH 29.8  26.0 - 34.0 pg   MCHC 33.4  30.0 - 36.0 g/dL   RDW 13.0 (*) 86.5 - 78.4 %   Platelets 269  150 - 400 K/uL  GLUCOSE, CAPILLARY     Status: Abnormal   Collection Time    01/20/13  7:44 AM      Result Value Range   Glucose-Capillary 337 (*) 70 - 99 mg/dL   Comment 1 Notify RN    GLUCOSE, CAPILLARY     Status: Abnormal   Collection Time    01/20/13 12:45 PM      Result Value Range   Glucose-Capillary 210 (*) 70 - 99 mg/dL       Patient condition at time of discharge/disposition: stable  Disposition-home   Follow up issues: 1. Will need addition of COPD controller medication at follow-up 2. Will likely need to start on allopurinol for his tophaceous gout-will need to bridge with colchicine for up to a month given chronicity and extent of patients gout. 3. F/u patients pending A1c, start treatment for DM if elevated  Discharge follow up:  Follow-up Information   Follow up with Standley Dakins, MD On 01/23/2013. (11:30 am, please bring medications and paper work from discharge)    Contact information:   [address] [place], [place], [place] [address]  [phone]         Discharge Instructions: Please refer to Patient Instructions section of EMR for full details.  Patient was counseled important signs and symptoms that should prompt return to medical care, changes in medications, dietary instructions, activity restrictions, and follow up appointments.   Discharge Medications   Medication List    STOP taking  these medications       atenolol 100 MG tablet  Commonly known as:  TENORMIN      TAKE these medications       albuterol 108 (90 BASE) MCG/ACT inhaler  Commonly known as:  PROVENTIL HFA;VENTOLIN HFA  Inhale 2 puffs into the lungs every 6 (six) hours. Do this for 2 days, then use as needed  colchicine 0.6 MG tablet  Take 1 tablet (0.6 mg total) by mouth daily.     ibuprofen 600 MG tablet  Commonly known as:  ADVIL,MOTRIN  Take 1 tablet (600 mg total) by mouth every 6 (six) hours as needed for pain.     levofloxacin 750 MG tablet  Commonly known as:  LEVAQUIN  Take 1 tablet (750 mg total) by mouth daily.     losartan 50 MG tablet  Commonly known as:  COZAAR  Take 1 tablet (50 mg total) by mouth daily.     predniSONE 50 MG tablet  Commonly known as:  DELTASONE  Please take one tablet per day for 4 days       Marikay Alar, MD of [facility] 01/20/2013 8:27 PM

## 2013-01-20 NOTE — H&P (Signed)
Seen and examined.  Discussed with Dr. Lula Olszewski.  Agree with admit, documentation and management.  Briefly, 52 yo male with acute on chronic bilateral knee pain and SOB.  Issues: 1. Acute exacerbation of COPD.  No breathing better.  Responding to steroids.  He now deserves COPD on his problem list. 2. 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  I am concerned about very poor control.  Even on a good day, he has a bed to chair existence.  No recent knee films to see if he now has erosive arthritis.  Uric acid remains chronically elevated - we ought to be able to do better. 3. Poor control of chronic problems.  Gout is just one of several chronic med problems which include COPD, HBP, DM, likely alcohol abuse, tobacco abuse, etc.  Seems to have no regular chronic care and makes frequent trips to ER.  We will get over these acute problems quickly.  He needs reliable outpatient follow up or these problems will just repeat themselves.

## 2013-01-20 NOTE — Progress Notes (Signed)
FMTS Attending Daily Note:  Renold Don MD  [phone] pager  Family Practice pager:  [phone] I have discussed this patient with the resident Dr. Birdie Sons and attending physician Dr. Leveda Anna.  I agree with their findings, assessment, and care plan

## 2013-01-21 LAB — GLUCOSE, CAPILLARY

## 2013-01-21 LAB — HEMOGLOBIN A1C: Mean Plasma Glucose: 146 mg/dL — ABNORMAL HIGH (ref <117)

## 2013-01-22 ENCOUNTER — Emergency Department (HOSPITAL_COMMUNITY)
Admission: EM | Admit: 2013-01-22 | Discharge: 2013-01-23 | Disposition: A | Discharge status: Home / Self Care | Payer: Self-pay | Attending: Emergency Medicine | Admitting: Emergency Medicine

## 2013-01-22 ENCOUNTER — Encounter (HOSPITAL_COMMUNITY): Payer: Self-pay

## 2013-01-22 ENCOUNTER — Emergency Department (HOSPITAL_COMMUNITY): Payer: Self-pay

## 2013-01-22 DIAGNOSIS — G40909 Epilepsy, unspecified, not intractable, without status epilepticus: Secondary | ICD-10-CM | POA: Insufficient documentation

## 2013-01-22 DIAGNOSIS — F1010 Alcohol abuse, uncomplicated: Secondary | ICD-10-CM | POA: Insufficient documentation

## 2013-01-22 DIAGNOSIS — M109 Gout, unspecified: Secondary | ICD-10-CM | POA: Insufficient documentation

## 2013-01-22 DIAGNOSIS — Z8679 Personal history of other diseases of the circulatory system: Secondary | ICD-10-CM | POA: Insufficient documentation

## 2013-01-22 DIAGNOSIS — I10 Essential (primary) hypertension: Secondary | ICD-10-CM | POA: Insufficient documentation

## 2013-01-22 DIAGNOSIS — F17200 Nicotine dependence, unspecified, uncomplicated: Secondary | ICD-10-CM | POA: Insufficient documentation

## 2013-01-22 DIAGNOSIS — R4182 Altered mental status, unspecified: Secondary | ICD-10-CM | POA: Insufficient documentation

## 2013-01-22 DIAGNOSIS — J4489 Other specified chronic obstructive pulmonary disease: Secondary | ICD-10-CM | POA: Insufficient documentation

## 2013-01-22 LAB — CBC WITH DIFFERENTIAL/PLATELET
Basophils Relative: 0 % (ref 0–1)
HCT: 38.7 % — ABNORMAL LOW (ref 39.0–52.0)
Hemoglobin: 13 g/dL (ref 13.0–17.0)
Lymphs Abs: 2.5 10*3/uL (ref 0.7–4.0)
MCHC: 33.6 g/dL (ref 30.0–36.0)
Monocytes Absolute: 0.4 10*3/uL (ref 0.1–1.0)
Monocytes Relative: 5 % (ref 3–12)
Neutro Abs: 4.6 10*3/uL (ref 1.7–7.7)
Neutrophils Relative %: 61 % (ref 43–77)
RBC: 4.41 MIL/uL (ref 4.22–5.81)

## 2013-01-22 LAB — URINALYSIS, ROUTINE W REFLEX MICROSCOPIC
Glucose, UA: NEGATIVE mg/dL
Ketones, ur: NEGATIVE mg/dL
Leukocytes, UA: NEGATIVE
Protein, ur: NEGATIVE mg/dL
Urobilinogen, UA: 0.2 mg/dL (ref 0.0–1.0)

## 2013-01-22 LAB — COMPREHENSIVE METABOLIC PANEL
Alkaline Phosphatase: 59 U/L (ref 39–117)
BUN: 16 mg/dL (ref 6–23)
CO2: 24 mEq/L (ref 19–32)
Chloride: 103 mEq/L (ref 96–112)
Creatinine, Ser: 1 mg/dL (ref 0.50–1.35)
GFR calc Af Amer: >90 mL/min (ref >90)
GFR calc non Af Amer: 86 mL/min — ABNORMAL LOW (ref >90)
Glucose, Bld: 108 mg/dL — ABNORMAL HIGH (ref 70–99)
Potassium: 3.6 mEq/L (ref 3.5–5.1)
Total Bilirubin: 0.3 mg/dL (ref 0.3–1.2)

## 2013-01-22 LAB — RAPID URINE DRUG SCREEN, HOSP PERFORMED
Amphetamines: NOT DETECTED
Opiates: NOT DETECTED

## 2013-01-22 LAB — LIPASE, BLOOD: Lipase: 47 U/L (ref 11–59)

## 2013-01-22 LAB — TROPONIN I: Troponin I: <0.3 ng/mL (ref <0.30)

## 2013-01-22 LAB — ETHANOL: Alcohol, Ethyl (B): 186 mg/dL — ABNORMAL HIGH (ref 0–11)

## 2013-01-22 IMAGING — CR DG CHEST 1V
1 series · 1 of 1 positions shown · non-contrast
Comparison: [DATE] and [DATE]

CLINICAL DATA: Chest pain.  Seizure.

CHEST - 1 VIEW

[x chest ap]
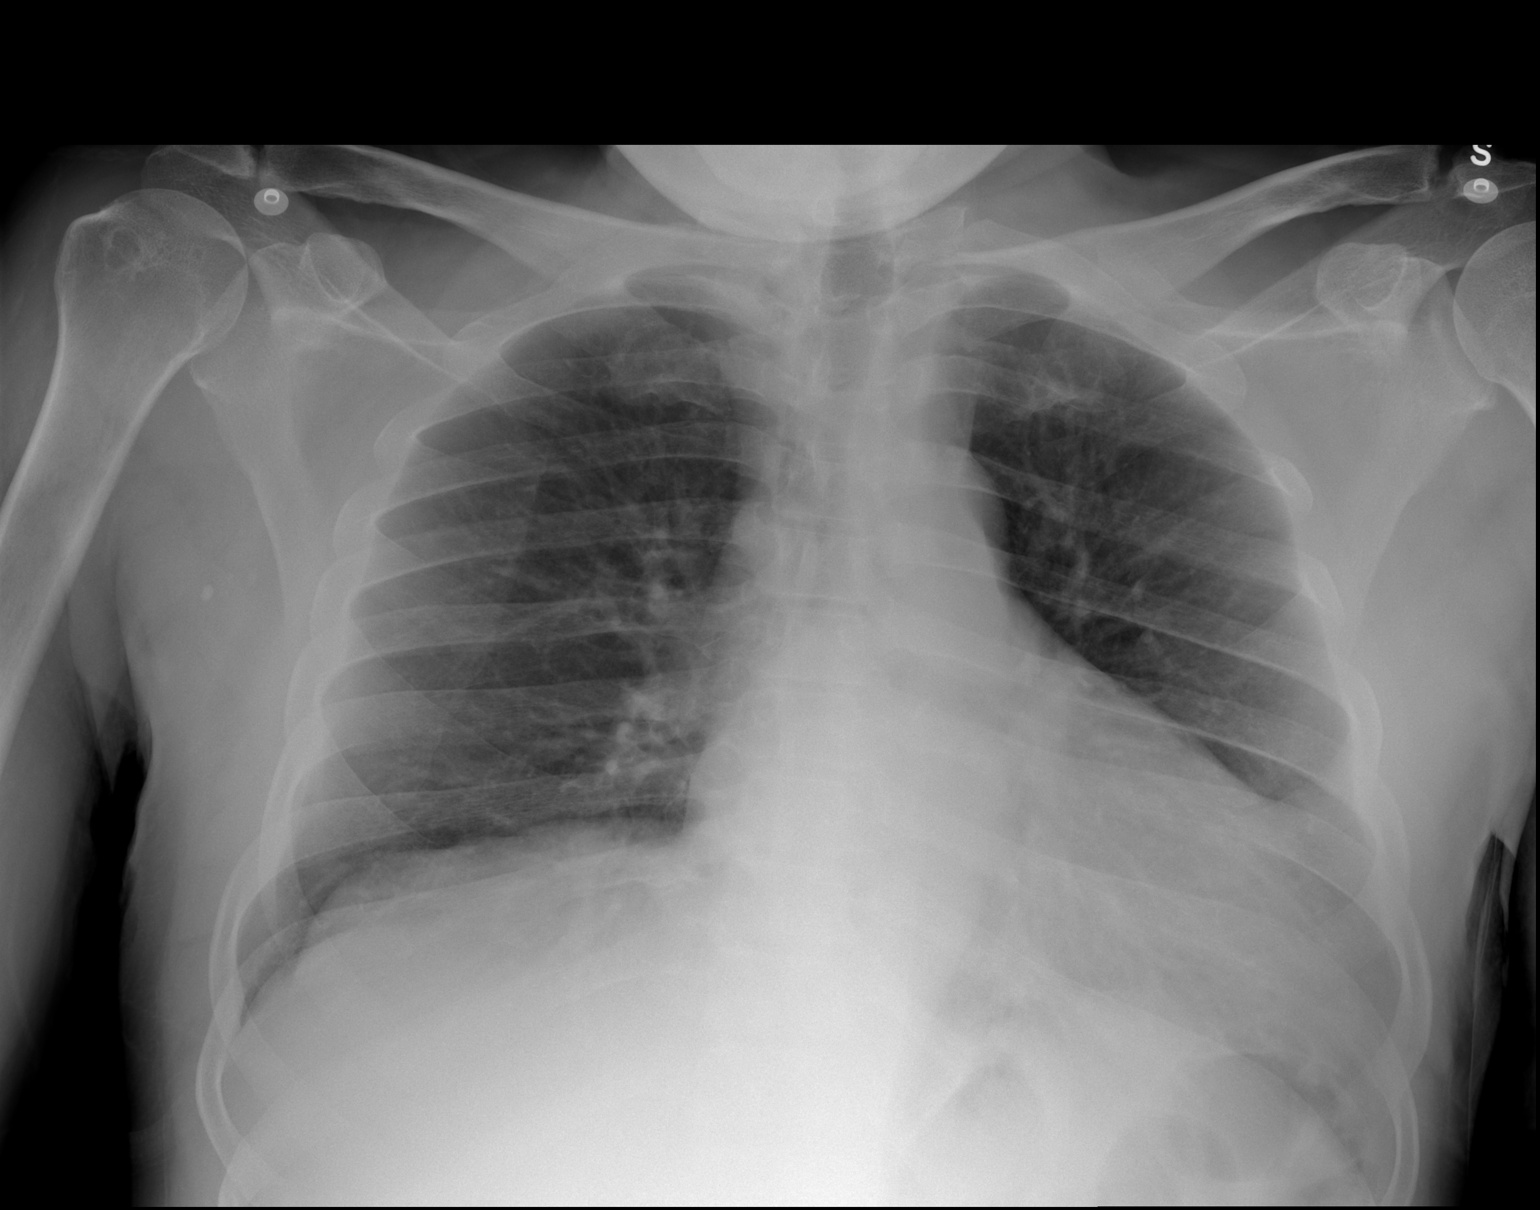

[1 of 1 positions shown; findings below may reference images not displayed]

FINDINGS: The heart size and pulmonary vascularity are normal and
the lungs are clear.  No osseous abnormality of significance.
IMPRESSION: No acute disease.

## 2013-01-22 IMAGING — CT CT CERVICAL SPINE W/O CM
2 of 4 series · 6 of 14 positions shown, 7 images · non-contrast
Comparison: CT head [DATE].

CT HEAD

CLINICAL DATA: Alcohol intoxication, possible seizure.

CT HEAD WITHOUT CONTRAST
CT CERVICAL SPINE WITHOUT CONTRAST
TECHNIQUE: Multidetector CT imaging of the head and cervical spine
was performed following the standard protocol without intravenous
contrast.  Multiplanar CT image reconstructions of the cervical
spine were also generated.

[Series 4: c-spine st · axial · 0.24mm/px · z∈[+466,+546]mm · 3 of 81 slices shown, 4 images]
[im 21/81  soft-tissue]
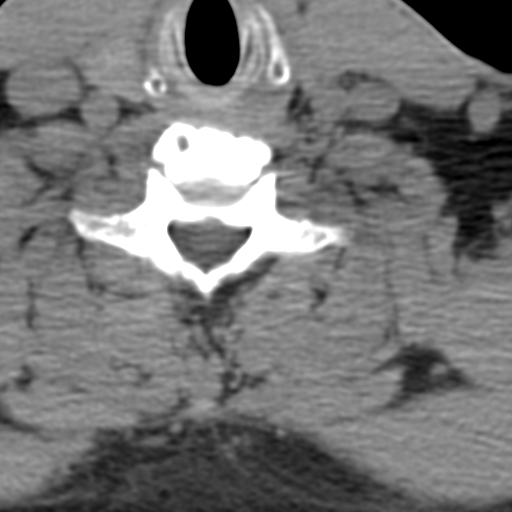
[im 21/81  bone]
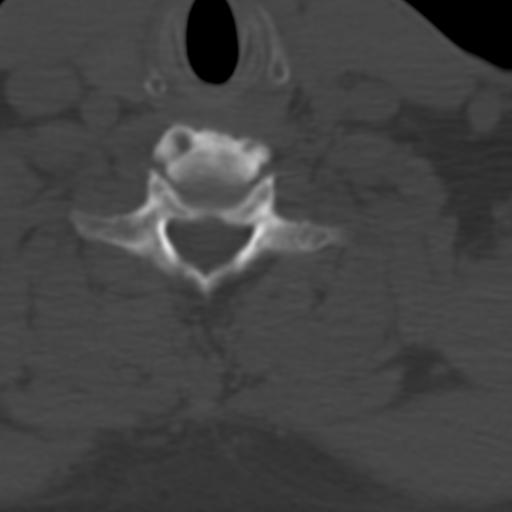
[im 41/81  bone]
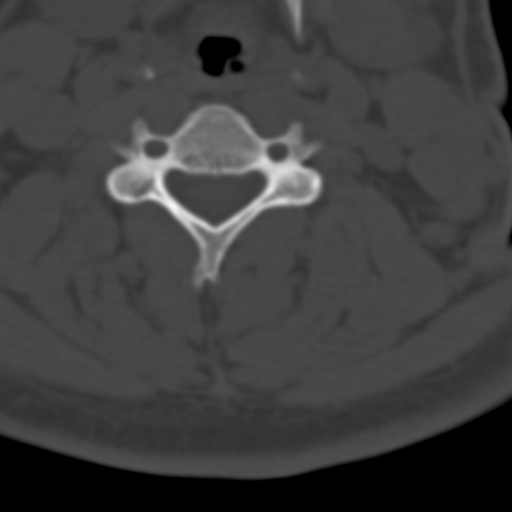
[im 61/81  bone]
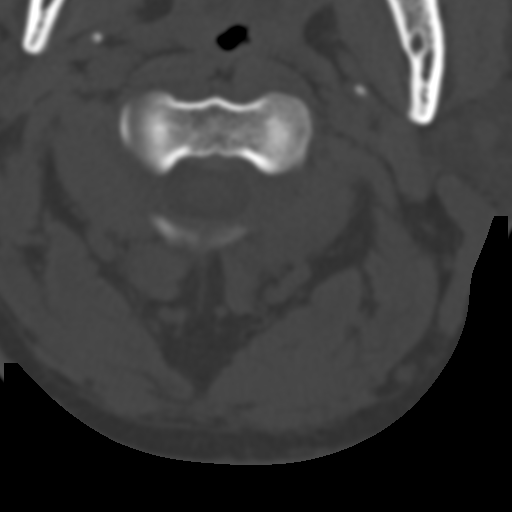

[Series 7: axial recon · axial · 0.23mm/px · z∈[+452,+532]mm · 3 of 83 slices shown]
[im 21/83  bone]
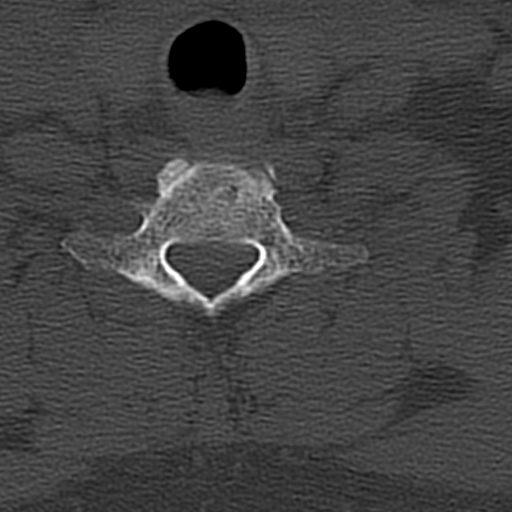
[im 42/83  bone]
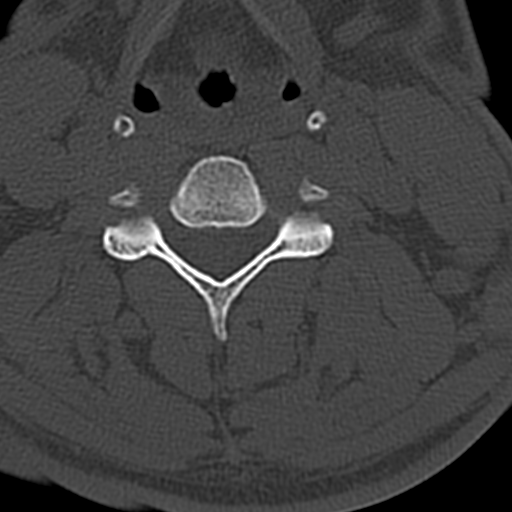
[im 62/83  bone]
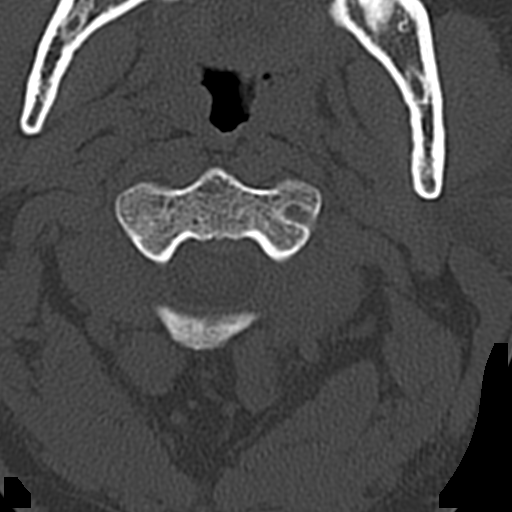

[6 of 14 positions shown; findings below may reference images not displayed]

FINDINGS: There is no evidence for acute infarction, intracranial
hemorrhage, mass lesion, hydrocephalus, or extra-axial fluid. There
may be early cerebellar atrophy.  Borderline cerebral atrophy.  No
definite white matter disease.  Calvarium intact.  Mild chronic
sinus disease.  No mastoid fluid.
IMPRESSION: Suspect mild atrophy, particularly superior vermian.  No acute
intracranial findings.  Similar appearance to priors.

CT CERVICAL SPINE
FINDINGS: No visible cervical spine fracture or traumatic
subluxation.  Mild spondylosis C6-C7.  Mild carotid calcification.
Lung apices clear.  No prevertebral soft tissue swelling
intraspinal hematoma.
IMPRESSION: No acute cervical spine findings.

## 2013-01-22 MED ORDER — SODIUM CHLORIDE 0.9 % IV BOLUS (SEPSIS)
500.0000 mL | Freq: Once | INTRAVENOUS | Status: AC
  Administered 2013-01-22: 500 mL via INTRAVENOUS

## 2013-01-22 NOTE — ED Provider Notes (Signed)
History     CSN: [id number]  Arrival date & time 01/22/13  1808   First MD Initiated Contact with Patient 01/22/13 1838      Chief Complaint  Patient presents with  . Alcohol Intoxication   level V caveat due 2 intoxication   ( she andConsider location/radiation/quality/duration/timing/severity/associated sxs/prior treatment) Patient is a 52 y.o. male presenting with intoxication. The history is provided by the patient.  Alcohol Intoxication   patient was brought in after a likely seizure. He has a history of seizures, but states he's not had one in a long time. He appears intoxicated. He states he has been drinking.   Past Medical History  Diagnosis Date  . Hypertension   . Gout   . Angina pectoris   . COPD (chronic obstructive pulmonary disease)     History reviewed. No pertinent past surgical history.  Family History  Problem Relation Age of Onset  . Diabetes type II Mother   . Depression Father   . Asthma Brother     History  Substance Use Topics  . Smoking status: Current Every Day Smoker    Types: Cigarettes  . Smokeless tobacco: Never Used  . Alcohol Use: 10.5 oz/week    21 drink(s) per week      Review of Systems  Unable to perform ROS: Mental status change    Allergies  Review of patient's allergies indicates no known allergies.  Home Medications   Current Outpatient Rx  Name  Route  Sig  Dispense  Refill  . albuterol (PROVENTIL HFA;VENTOLIN HFA) 108 (90 BASE) MCG/ACT inhaler   Inhalation   Inhale 2 puffs into the lungs every 6 (six) hours. Do this for 2 days, then use as needed   1 Inhaler   0   . colchicine 0.6 MG tablet   Oral   Take 1 tablet (0.6 mg total) by mouth daily.   30 tablet   1   . ibuprofen (ADVIL,MOTRIN) 600 MG tablet   Oral   Take 1 tablet (600 mg total) by mouth every 6 (six) hours as needed for pain.   120 tablet   0   . levofloxacin (LEVAQUIN) 750 MG tablet   Oral   Take 1 tablet (750 mg total) by mouth  daily.   6 tablet   0   . losartan (COZAAR) 50 MG tablet   Oral   Take 1 tablet (50 mg total) by mouth daily.   30 tablet   1   . predniSONE (DELTASONE) 50 MG tablet      Please take one tablet per day for 4 days   4 tablet   0     BP 169/108  Pulse 97  Temp(Src) 97.6 F (36.4 C) (Oral)  Resp 20  SpO2 90%  Physical Exam  Nursing note and vitals reviewed. Constitutional: He appears well-developed and well-nourished.  HENT:  Head: Normocephalic.  Tenderness posterior scalp.  Eyes: EOM are normal. Pupils are equal, round, and reactive to light.  Neck: Normal range of motion. Neck supple.  Cardiovascular: Normal rate, regular rhythm and normal heart sounds.   No murmur heard. Pulmonary/Chest: Effort normal and breath sounds normal.  Abdominal: Soft. Bowel sounds are normal. He exhibits no distension and no mass. There is no tenderness. There is no rebound and no guarding.  Musculoskeletal: Normal range of motion. He exhibits no edema.  Neurological: He is alert. No cranial nerve deficit.  Patient appears intoxicated.  Skin: Skin is warm and dry.  Psychiatric: He has a normal mood and affect.    ED Course  Procedures (including critical care time)  Labs Reviewed  CBC WITH DIFFERENTIAL - Abnormal; Notable for the following:    HCT 38.7 (*)    All other components within normal limits  COMPREHENSIVE METABOLIC PANEL - Abnormal; Notable for the following:    Glucose, Bld 108 (*)    Albumin 3.0 (*)    GFR calc non Af Amer 86 (*)    All other components within normal limits  ETHANOL - Abnormal; Notable for the following:    Alcohol, Ethyl (B) 186 (*)    All other components within normal limits  URINALYSIS, ROUTINE W REFLEX MICROSCOPIC  URINE RAPID DRUG SCREEN (HOSP PERFORMED)  LIPASE, BLOOD  TROPONIN I   Dg Chest 1 View  01/22/2013  *RADIOLOGY REPORT*  Clinical Data: Chest pain.  Seizure.  CHEST - 1 VIEW  Comparison: 02/17 and 12/29/2012  Findings: The heart  size and pulmonary vascularity are normal and the lungs are clear.  No osseous abnormality of significance.  IMPRESSION: No acute disease.   Original Report Authenticated By: Francene Boyers, M.D.    Ct Head Wo Contrast  01/22/2013  *RADIOLOGY REPORT*  Clinical Data:  Alcohol intoxication, possible seizure.  CT HEAD WITHOUT CONTRAST CT CERVICAL SPINE WITHOUT CONTRAST  Technique:  Multidetector CT imaging of the head and cervical spine was performed following the standard protocol without intravenous contrast.  Multiplanar CT image reconstructions of the cervical spine were also generated.  Comparison:  CT head 12/29/2012.  CT HEAD  Findings: There is no evidence for acute infarction, intracranial hemorrhage, mass lesion, hydrocephalus, or extra-axial fluid. There may be early cerebellar atrophy.  Borderline cerebral atrophy.  No definite white matter disease.  Calvarium intact.  Mild chronic sinus disease.  No mastoid fluid.  IMPRESSION: Suspect mild atrophy, particularly superior vermian.  No acute intracranial findings.  Similar appearance to priors.  CT CERVICAL SPINE  Findings: No visible cervical spine fracture or traumatic subluxation.  Mild spondylosis C6-C7.  Mild carotid calcification. Lung apices clear.  No prevertebral soft tissue swelling intraspinal hematoma.  IMPRESSION: No acute cervical spine findings.   Original Report Authenticated By: Davonna Belling, M.D.    Ct Cervical Spine Wo Contrast  01/22/2013  *RADIOLOGY REPORT*  Clinical Data:  Alcohol intoxication, possible seizure.  CT HEAD WITHOUT CONTRAST CT CERVICAL SPINE WITHOUT CONTRAST  Technique:  Multidetector CT imaging of the head and cervical spine was performed following the standard protocol without intravenous contrast.  Multiplanar CT image reconstructions of the cervical spine were also generated.  Comparison:  CT head 12/29/2012.  CT HEAD  Findings: There is no evidence for acute infarction, intracranial hemorrhage, mass lesion,  hydrocephalus, or extra-axial fluid. There may be early cerebellar atrophy.  Borderline cerebral atrophy.  No definite white matter disease.  Calvarium intact.  Mild chronic sinus disease.  No mastoid fluid.  IMPRESSION: Suspect mild atrophy, particularly superior vermian.  No acute intracranial findings.  Similar appearance to priors.  CT CERVICAL SPINE  Findings: No visible cervical spine fracture or traumatic subluxation.  Mild spondylosis C6-C7.  Mild carotid calcification. Lung apices clear.  No prevertebral soft tissue swelling intraspinal hematoma.  IMPRESSION: No acute cervical spine findings.   Original Report Authenticated By: Davonna Belling, M.D.      1. Altered mental status   2. Alcohol intoxication   3. Seizure       MDM  Patient presents with altered mental status and  possibly have had a seizure. He appears to be intoxicated. Head CT is negative. He continues to be sleepy. Lab work is overall reassuring. His alcohol level is 186. Patient's exam is nonfocal. Wait further sobriety and reevaluate with possible discharge home.        Juliet Rude. Rubin Payor, MD 01/23/13 [phone]

## 2013-01-22 NOTE — ED Notes (Signed)
Per EMS pt found at home with "lots" 40 bottles laying around the house, neighbor called EMS states thought pt had a seizure, pt has hx of seizure with no meds. 160/90, p-92

## 2013-01-23 MED ORDER — COLCHICINE 0.6 MG PO TABS
0.6000 mg | ORAL_TABLET | Freq: Once | ORAL | Status: AC
  Administered 2013-01-23: 0.6 mg via ORAL
  Filled 2013-01-23: qty 1

## 2013-01-23 MED ORDER — IPRATROPIUM BROMIDE 0.02 % IN SOLN
0.5000 mg | RESPIRATORY_TRACT | Status: DC
  Administered 2013-01-23: 0.5 mg via RESPIRATORY_TRACT
  Filled 2013-01-23: qty 2.5

## 2013-01-23 MED ORDER — ALBUTEROL SULFATE HFA 108 (90 BASE) MCG/ACT IN AERS
2.0000 | INHALATION_SPRAY | RESPIRATORY_TRACT | Status: DC | PRN
  Administered 2013-01-23: 2 via RESPIRATORY_TRACT
  Filled 2013-01-23: qty 6.7

## 2013-01-23 MED ORDER — LOSARTAN POTASSIUM 50 MG PO TABS
50.0000 mg | ORAL_TABLET | Freq: Once | ORAL | Status: AC
  Administered 2013-01-23: 50 mg via ORAL
  Filled 2013-01-23: qty 1

## 2013-01-23 MED ORDER — ALBUTEROL SULFATE (5 MG/ML) 0.5% IN NEBU
2.5000 mg | INHALATION_SOLUTION | RESPIRATORY_TRACT | Status: DC
  Administered 2013-01-23: 2.5 mg via RESPIRATORY_TRACT
  Filled 2013-01-23: qty 0.5

## 2013-01-23 NOTE — ED Notes (Signed)
MD at bedside.  Notified of pts BP.

## 2013-01-23 NOTE — Discharge Summary (Signed)
Family Medicine Teaching Service  Discharge Note : Attending Jeff Farin Buhman MD Pager [phone] Inpatient Team Pager:  [phone]  I have seen and examined this patient, reviewed their chart and discussed discharge planning with the resident at the time of discharge. I agree with the discharge plan as above.  

## 2013-01-23 NOTE — Progress Notes (Signed)
PT was provided written and verbal education on COPD and Asthma (COPD Gold). PT states he does not take any respiratory medications at home other than Albuterol MDI. MD aware that PT needs MDI for discharge. Order was placed by MD, MDI with spacer was retrieved by RT, RT provided education on medication administration. PT demonstrated verbal and hands on understanding of Albuterol MDI. Per MD, Bonk PT has information on following up with pulmonologist.

## 2013-01-23 NOTE — ED Provider Notes (Signed)
Darryl Hurley is a 52 y.o. male who presented with possible seizure last night intoxicated with alcohol during his wife's birthday party.  Patient was actually released from the hospital the day before he presented to the ER again, patient was hospitalized for COPD exacerbation and gout flare.  Patient says his foot is now hurting.  I assumed care of this patient overnight from second shift, patient is awake, alert and oriented, he is complaining about right foot pain and is not complaining about shortness of breath but does have some wheezing when auscultated. We'll give the patient some breathing treatments as well as a colchicine tablet. Patient has appropriate followup, he is to be referred to a pulmonologist in the near future per notes from his hospitalization.  I explained the diagnosis and have given explicit precautions to return to the ER including any other new or worsening symptoms. The patient understands and accepts the medical plan as it's been dictated and I have answered their questions. Discharge instructions concerning home care and prescriptions have been given.  The patient is STABLE and is discharged to home in good condition.    Jones Skene, MD 01/24/13 [phone]

## 2013-05-04 ENCOUNTER — Emergency Department (HOSPITAL_COMMUNITY): Payer: Medicaid Other

## 2013-05-04 ENCOUNTER — Encounter (HOSPITAL_COMMUNITY): Payer: Self-pay | Admitting: *Deleted

## 2013-05-04 ENCOUNTER — Inpatient Hospital Stay (HOSPITAL_COMMUNITY)
Admission: EM | Admit: 2013-05-04 | Discharge: 2013-05-06 | DRG: 194 | Disposition: A | Discharge status: Home / Self Care | Payer: Medicaid Other | Attending: Internal Medicine | Admitting: Internal Medicine

## 2013-05-04 DIAGNOSIS — J189 Pneumonia, unspecified organism: Principal | ICD-10-CM | POA: Diagnosis present

## 2013-05-04 DIAGNOSIS — J441 Chronic obstructive pulmonary disease with (acute) exacerbation: Secondary | ICD-10-CM | POA: Diagnosis present

## 2013-05-04 DIAGNOSIS — M109 Gout, unspecified: Secondary | ICD-10-CM | POA: Diagnosis present

## 2013-05-04 DIAGNOSIS — I10 Essential (primary) hypertension: Secondary | ICD-10-CM | POA: Diagnosis present

## 2013-05-04 DIAGNOSIS — Z79899 Other long term (current) drug therapy: Secondary | ICD-10-CM

## 2013-05-04 DIAGNOSIS — J4531 Mild persistent asthma with (acute) exacerbation: Secondary | ICD-10-CM

## 2013-05-04 DIAGNOSIS — J45901 Unspecified asthma with (acute) exacerbation: Secondary | ICD-10-CM

## 2013-05-04 DIAGNOSIS — Z23 Encounter for immunization: Secondary | ICD-10-CM

## 2013-05-04 DIAGNOSIS — J45909 Unspecified asthma, uncomplicated: Secondary | ICD-10-CM | POA: Diagnosis present

## 2013-05-04 DIAGNOSIS — Z91199 Patient's noncompliance with other medical treatment and regimen due to unspecified reason: Secondary | ICD-10-CM

## 2013-05-04 DIAGNOSIS — Z9119 Patient's noncompliance with other medical treatment and regimen: Secondary | ICD-10-CM

## 2013-05-04 DIAGNOSIS — F17200 Nicotine dependence, unspecified, uncomplicated: Secondary | ICD-10-CM | POA: Diagnosis present

## 2013-05-04 LAB — BASIC METABOLIC PANEL
BUN: 9 mg/dL (ref 6–23)
Chloride: 100 mEq/L (ref 96–112)
Creatinine, Ser: 1.02 mg/dL (ref 0.50–1.35)
GFR calc Af Amer: >90 mL/min (ref >90)
GFR calc non Af Amer: 84 mL/min — ABNORMAL LOW (ref >90)
Potassium: 3.9 mEq/L (ref 3.5–5.1)

## 2013-05-04 LAB — POCT I-STAT TROPONIN I: Troponin i, poc: 0 ng/mL (ref 0.00–0.08)

## 2013-05-04 LAB — CBC
MCHC: 33.2 g/dL (ref 30.0–36.0)
RDW: 15.8 % — ABNORMAL HIGH (ref 11.5–15.5)
WBC: 12.4 10*3/uL — ABNORMAL HIGH (ref 4.0–10.5)

## 2013-05-04 LAB — EXPECTORATED SPUTUM ASSESSMENT W GRAM STAIN, RFLX TO RESP C

## 2013-05-04 LAB — STREP PNEUMONIAE URINARY ANTIGEN: Strep Pneumo Urinary Antigen: NEGATIVE

## 2013-05-04 IMAGING — CR DG CHEST 2V
2 series · 2 of 2 positions shown · non-contrast
Comparison: PA and lateral chest [DATE] and portable chest
[DATE].

CLINICAL DATA: Cough.

CHEST - 2 VIEW

[w chest pa]
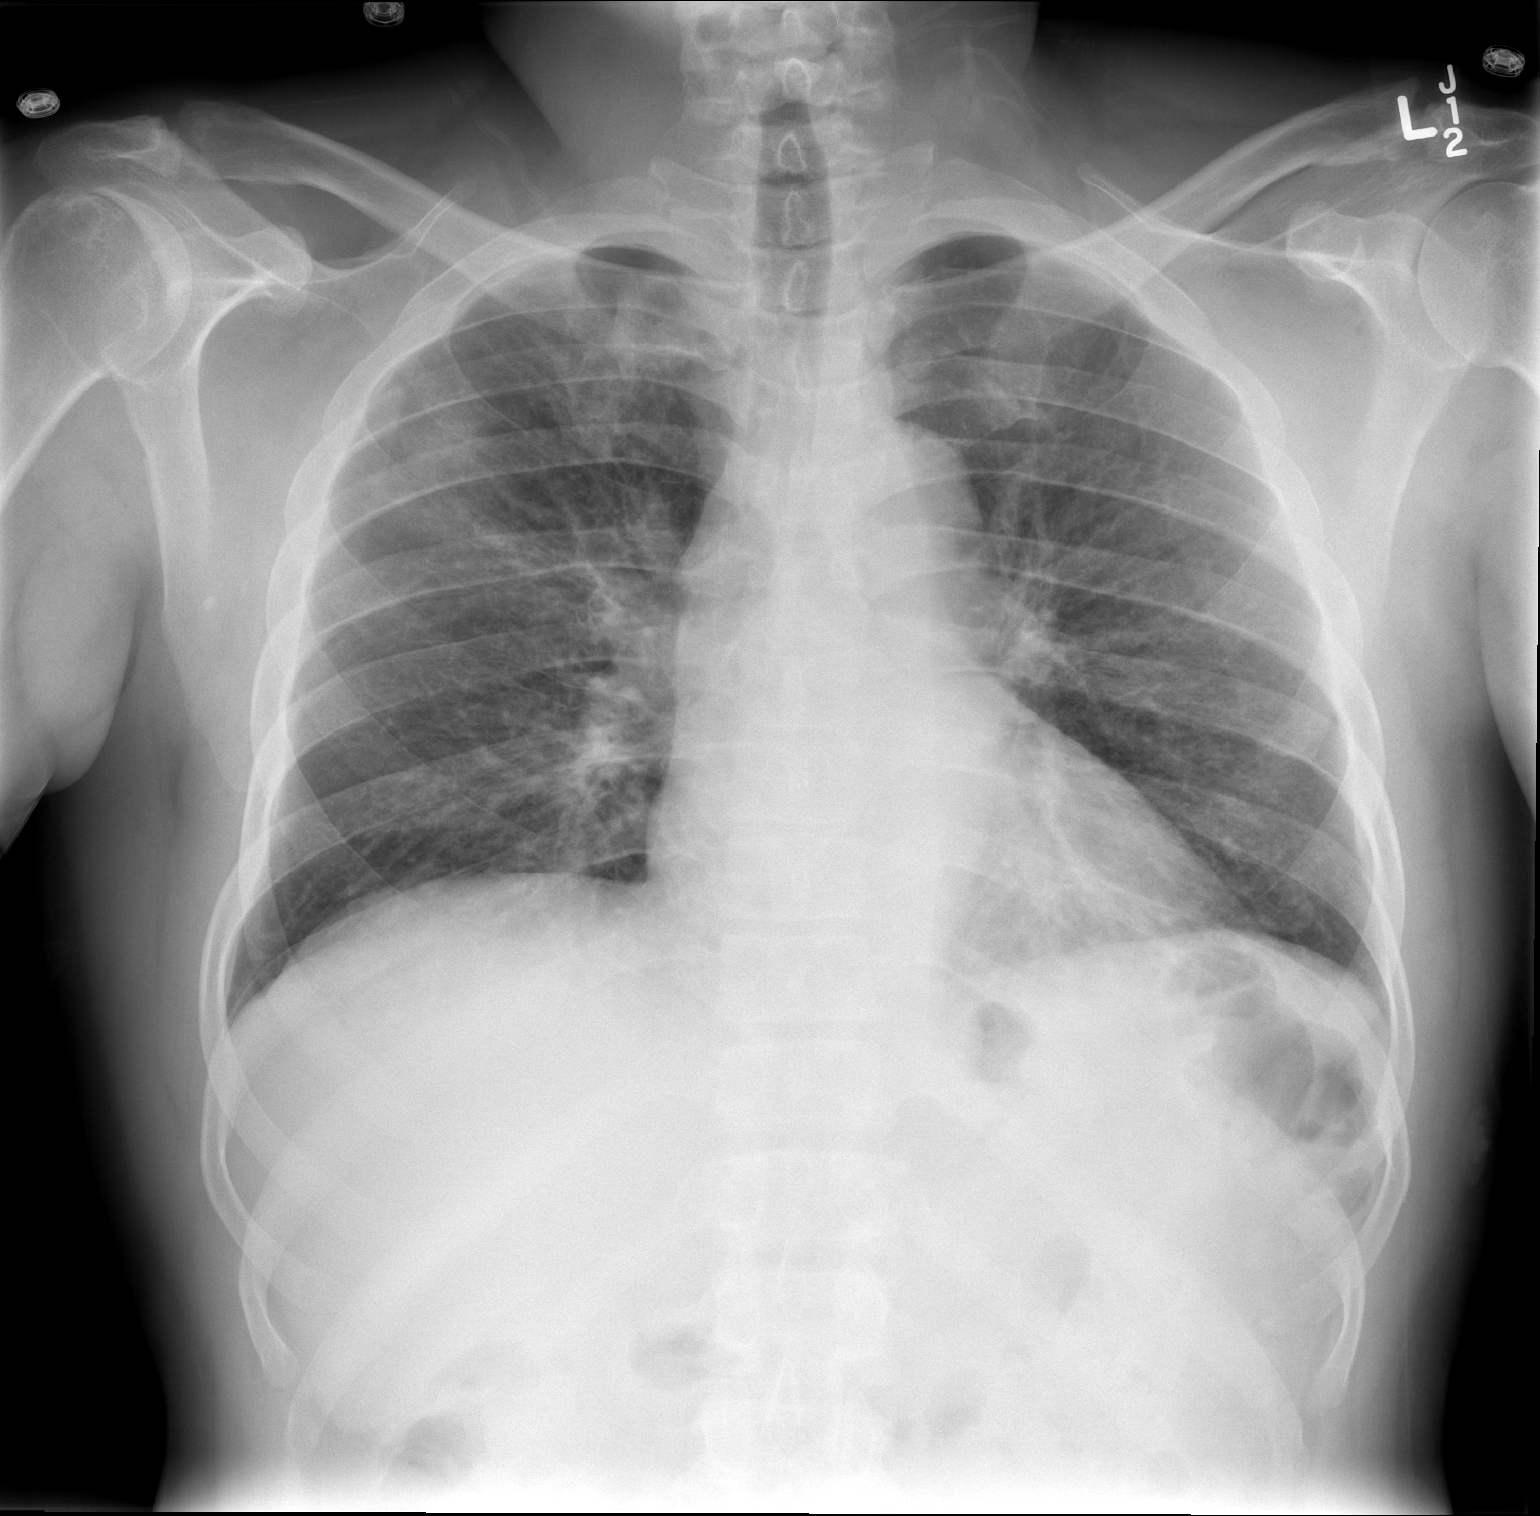

[w chest lat]
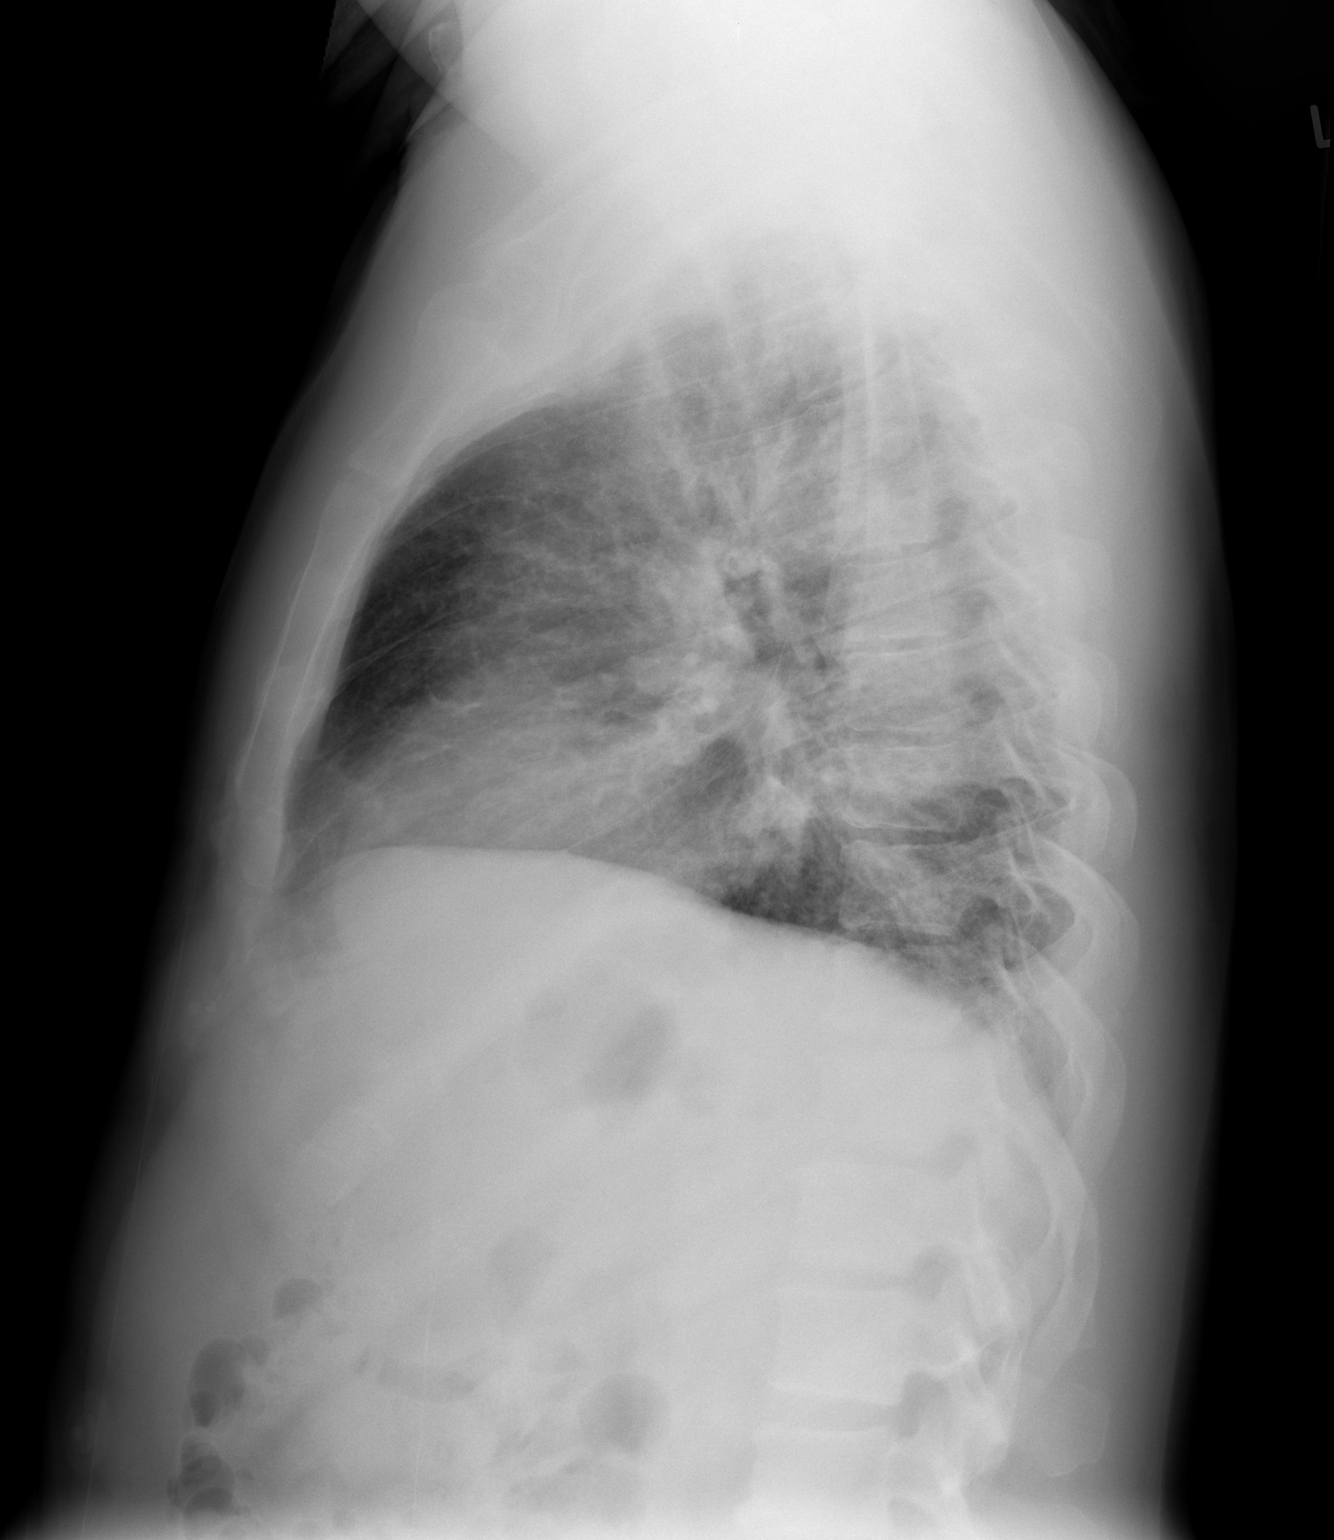

[2 of 2 positions shown; findings below may reference images not displayed]

FINDINGS: There is patchy airspace disease in the right upper lobe
most consistent with pneumonia.  The lungs are otherwise clear.
Peribronchial thickening is noted.  Heart size is normal.  No
pneumothorax or pleural fluid.
IMPRESSION: Bronchitic change with patchy right upper lobe airspace opacity
worrisome for pneumonia.

## 2013-05-04 MED ORDER — DEXTROSE 5 % IV SOLN
500.0000 mg | INTRAVENOUS | Status: DC
  Administered 2013-05-04 – 2013-05-05 (×2): 500 mg via INTRAVENOUS
  Filled 2013-05-04 (×3): qty 500

## 2013-05-04 MED ORDER — SODIUM CHLORIDE 0.9 % IJ SOLN
3.0000 mL | Freq: Two times a day (BID) | INTRAMUSCULAR | Status: DC
  Administered 2013-05-04 – 2013-05-05 (×2): 3 mL via INTRAVENOUS

## 2013-05-04 MED ORDER — HYDROCODONE-ACETAMINOPHEN 10-325 MG PO TABS: 2.0000 | ORAL_TABLET | Freq: Four times a day (QID) | ORAL | Status: DC | PRN

## 2013-05-04 MED ORDER — SODIUM CHLORIDE 0.9 % IV SOLN: 250.0000 mL | INTRAVENOUS | Status: DC | PRN

## 2013-05-04 MED ORDER — IPRATROPIUM BROMIDE 0.02 % IN SOLN
0.5000 mg | Freq: Once | RESPIRATORY_TRACT | Status: AC
  Administered 2013-05-04: 0.5 mg via RESPIRATORY_TRACT
  Filled 2013-05-04: qty 2.5

## 2013-05-04 MED ORDER — COLCHICINE 0.6 MG PO TABS
0.6000 mg | ORAL_TABLET | Freq: Every day | ORAL | Status: DC
Start: 2013-05-04 — End: 2013-05-06
  Administered 2013-05-04 – 2013-05-06 (×3): 0.6 mg via ORAL
  Filled 2013-05-04 (×3): qty 1

## 2013-05-04 MED ORDER — DEXTROSE 5 % IV SOLN
1.0000 g | INTRAVENOUS | Status: DC
  Administered 2013-05-04 – 2013-05-05 (×2): 1 g via INTRAVENOUS
  Filled 2013-05-04 (×3): qty 10

## 2013-05-04 MED ORDER — IPRATROPIUM BROMIDE 0.02 % IN SOLN
0.5000 mg | Freq: Four times a day (QID) | RESPIRATORY_TRACT | Status: DC
  Administered 2013-05-04 – 2013-05-05 (×2): 0.5 mg via RESPIRATORY_TRACT
  Filled 2013-05-04 (×2): qty 2.5

## 2013-05-04 MED ORDER — ENOXAPARIN SODIUM 40 MG/0.4ML ~~LOC~~ SOLN
40.0000 mg | SUBCUTANEOUS | Status: DC
  Administered 2013-05-04 – 2013-05-05 (×2): 40 mg via SUBCUTANEOUS
  Filled 2013-05-04 (×3): qty 0.4

## 2013-05-04 MED ORDER — PREDNISONE 20 MG PO TABS
40.0000 mg | ORAL_TABLET | Freq: Every day | ORAL | Status: DC
  Administered 2013-05-05 – 2013-05-06 (×2): 40 mg via ORAL
  Filled 2013-05-04 (×3): qty 2

## 2013-05-04 MED ORDER — LOSARTAN POTASSIUM 50 MG PO TABS
50.0000 mg | ORAL_TABLET | Freq: Every day | ORAL | Status: DC
  Administered 2013-05-04 – 2013-05-06 (×3): 50 mg via ORAL
  Filled 2013-05-04 (×4): qty 1

## 2013-05-04 MED ORDER — HYDROCODONE-ACETAMINOPHEN 10-325 MG PO TABS
1.0000 | ORAL_TABLET | Freq: Four times a day (QID) | ORAL | Status: DC | PRN
  Administered 2013-05-04: 2 via ORAL
  Administered 2013-05-06: 1 via ORAL
  Filled 2013-05-04: qty 1
  Filled 2013-05-04: qty 2

## 2013-05-04 MED ORDER — PNEUMOCOCCAL VAC POLYVALENT 25 MCG/0.5ML IJ INJ
0.5000 mL | INJECTION | INTRAMUSCULAR | Status: AC
  Filled 2013-05-04: qty 0.5

## 2013-05-04 MED ORDER — METHYLPREDNISOLONE SODIUM SUCC 125 MG IJ SOLR
125.0000 mg | Freq: Once | INTRAMUSCULAR | Status: AC
  Administered 2013-05-04: 125 mg via INTRAVENOUS
  Filled 2013-05-04 (×3): qty 2

## 2013-05-04 MED ORDER — ALBUTEROL (5 MG/ML) CONTINUOUS INHALATION SOLN
10.0000 mg/h | INHALATION_SOLUTION | RESPIRATORY_TRACT | Status: DC
  Administered 2013-05-04: 10 mg/h via RESPIRATORY_TRACT
  Filled 2013-05-04: qty 20

## 2013-05-04 MED ORDER — ALBUTEROL SULFATE (5 MG/ML) 0.5% IN NEBU
5.0000 mg | INHALATION_SOLUTION | Freq: Once | RESPIRATORY_TRACT | Status: AC
  Administered 2013-05-04: 5 mg via RESPIRATORY_TRACT
  Filled 2013-05-04: qty 1

## 2013-05-04 MED ORDER — SODIUM CHLORIDE 0.9 % IJ SOLN
3.0000 mL | INTRAMUSCULAR | Status: DC | PRN
  Administered 2013-05-05 – 2013-05-06 (×3): 3 mL via INTRAVENOUS

## 2013-05-04 MED ORDER — ALBUTEROL SULFATE (5 MG/ML) 0.5% IN NEBU
2.5000 mg | INHALATION_SOLUTION | Freq: Four times a day (QID) | RESPIRATORY_TRACT | Status: DC
  Administered 2013-05-04 – 2013-05-05 (×2): 2.5 mg via RESPIRATORY_TRACT
  Filled 2013-05-04 (×2): qty 0.5

## 2013-05-04 NOTE — ED Notes (Signed)
Ordered lunch tray 

## 2013-05-04 NOTE — H&P (Signed)
Date: 05/04/2013               Patient Name:  Darryl Hurley MRN: [id number]  DOB: 03-Jul-1961 Age / Sex: 52 y.o., male   PCP: Provider Default, MD              Medical Service: Internal Medicine Teaching Service              Attending Physician: Dr. Carleene Cooper III, MD    First Contact: Dr. Elenor Legato Pager: [phone]  Second Contact: Dr. Suszanne Conners Pager: [phone]            After Hours (After 5p/  First Contact Pager: [phone]  weekends / holidays): Second Contact Pager: [phone]     Chief Complaint: shortness of breath, cough  History of Present Illness: Patient is a 52 y.o. male with a PMHx of COPD, gout, HTN who presents to [facility] for evaluation of shortness of breath and cough. Patient states that he developed a cough productive of green sputum approximately 6 days ago, after which he began to develop slowly and progressively worsening shortness of breath. He admits to fever and chills for the same period of time. The patient states that he has an albuterol inhaler for COPD/asthma but states that he ran out of this medication prior to his symptoms starting, and does not have any other inhalers. [name] states that his girlfriend was sick with similar symptoms of SOB/cough and subsequently improved after being prescribed a Z-Pak.   [name] is a long-time former smoker, however he states he recently quit smoking. He denies any alcohol or drug use.  Review of Systems: Constitutional:  admits to fever, chills.  HEENT: denies photophobia, eye pain, redness, hearing loss, ear pain, congestion, sore throat, rhinorrhea, sneezing, neck pain, neck stiffness and tinnitus.  Respiratory: admits to SOB, cough, chest tightness, and wheezing.  Cardiovascular: denies chest pain, palpitations and leg swelling.  Gastrointestinal: denies nausea, vomiting, abdominal pain, diarrhea, constipation, blood in stool.  Genitourinary: denies dysuria, urgency, frequency, hematuria, flank pain and  difficulty urinating.  Musculoskeletal: denies  myalgias, back pain, joint swelling, arthralgias and gait problem.   Skin: denies pallor, rash and wound.  Neurological: denies dizziness, seizures, syncope, weakness, light-headedness, numbness and headaches.   Hematological: denies adenopathy, easy bruising, personal or family bleeding history.  Psychiatric/ Behavioral: denies suicidal ideation, mood changes, confusion, nervousness, sleep disturbance and agitation.    Current Outpatient Medications: No current facility-administered medications on file prior to encounter.   Current Outpatient Prescriptions on File Prior to Encounter  Medication Sig Dispense Refill  . albuterol (PROVENTIL HFA;VENTOLIN HFA) 108 (90 BASE) MCG/ACT inhaler Inhale 2 puffs into the lungs every 6 (six) hours. Do this for 2 days, then use as needed  1 Inhaler  0  . colchicine 0.6 MG tablet Take 1 tablet (0.6 mg total) by mouth daily.  30 tablet  1  . losartan (COZAAR) 50 MG tablet Take 1 tablet (50 mg total) by mouth daily.  30 tablet  1     Allergies: No Known Allergies   Past Medical History: Past Medical History  Diagnosis Date  . Hypertension   . Gout   . Angina pectoris   . COPD (chronic obstructive pulmonary disease)     Past Surgical History: History reviewed. No pertinent past surgical history.  Family History: Family History  Problem Relation Age of Onset  . Diabetes type II Mother   . Depression Father   .  Asthma Brother     Social History: History   Social History  . Marital Status: Single    Spouse Name: N/A    Number of Children: N/A  . Years of Education: N/A   Occupational History  . Not on file.   Social History Main Topics  . Smoking status: Current Every Day Smoker    Types: Cigarettes  . Smokeless tobacco: Never Used  . Alcohol Use: 10.5 oz/week    21 drink(s) per week  . Drug Use: No  . Sexually Active: Not on file   Other Topics Concern  . Not on file    Social History Narrative  . No narrative on file     Vital Signs: Blood pressure 154/108, pulse 116, temperature 98.2 F (36.8 C), temperature source Oral, resp. rate 24, SpO2 93.00%.  Physical Exam: General: Vital signs reviewed and noted. Well-developed, well-nourished, in no acute distress; alert, appropriate and cooperative throughout examination.  Head: Normocephalic, atraumatic.  Eyes: PERRL, EOMI, No signs of anemia or jaundince.  Nose: Mucous membranes moist, not inflammed, nonerythematous.  Throat: Oropharynx nonerythematous, no exudate appreciated.   Neck: No deformities, masses, or tenderness noted.Supple, No carotid Bruits, no JVD.  Lungs:  Normal respiratory effort. Clear to auscultation BL without crackles or wheezes.  Heart: RRR. S1 and S2 normal without gallop, murmur, or rubs.  Abdomen:  BS normoactive. Soft, Nondistended, non-tender.  No masses or organomegaly.  Extremities: No pretibial edema.  Neurologic: A&O X3, CN II - XII are grossly intact. Motor strength is 5/5 in the all 4 extremities, Sensations intact to light touch, Cerebellar signs negative.  Skin: No visible rashes, scars.   Lab results: Comprehensive Metabolic Panel:    Component Value Date/Time   NA 140 05/04/2013 0940   K 3.9 05/04/2013 0940   CL 100 05/04/2013 0940   CO2 26 05/04/2013 0940   BUN 9 05/04/2013 0940   CREATININE 1.02 05/04/2013 0940   GLUCOSE 135* 05/04/2013 0940   CALCIUM 9.7 05/04/2013 0940   AST 23 01/22/2013 1911   ALT 19 01/22/2013 1911   ALKPHOS 59 01/22/2013 1911   BILITOT 0.3 01/22/2013 1911   PROT 7.5 01/22/2013 1911   ALBUMIN 3.0* 01/22/2013 1911    CBC:    Component Value Date/Time   WBC 12.4* 05/04/2013 0940   HGB 13.7 05/04/2013 0940   HCT 41.3 05/04/2013 0940   PLT 320 05/04/2013 0940   MCV 88.1 05/04/2013 0940   NEUTROABS 4.6 01/22/2013 1911   LYMPHSABS 2.5 01/22/2013 1911   MONOABS 0.4 01/22/2013 1911   EOSABS 0.0 01/22/2013 1911   BASOSABS 0.0 01/22/2013 1911    Urinalysis:   No results found for this basename: COLORURINE, APPERANCEUR, LABSPEC, PHURINE, GLUCOSEU, HGBUR, BILIRUBINUR, KETONESUR, PROTEINUR, UROBILINOGEN, NITRITE, LEUKOCYTESUR,  in the last 72 hours  Drugs of Abuse     Component Value Date/Time   LABOPIA NONE DETECTED 01/22/2013 1922   COCAINSCRNUR NONE DETECTED 01/22/2013 1922   LABBENZ NONE DETECTED 01/22/2013 1922   AMPHETMU NONE DETECTED 01/22/2013 1922   THCU NONE DETECTED 01/22/2013 1922   LABBARB NONE DETECTED 01/22/2013 1922     Troponin (Point of Care Test)  Recent Labs  05/04/13 0950  TROPIPOC 0.00     Historical Labs: Lab Results  Component Value Date   HGBA1C 6.7* 01/20/2013    No results found for this basename: CHOL, HDL, LDLCALC, LDLDIRECT, TRIG, CHOLHDL    No results found for this basename: TSH, T3TOTAL, T4TOTAL, THYROIDAB  Imaging results:  Dg Chest 2 View (if Patient Has Fever And/or Copd)  05/04/2013   *RADIOLOGY REPORT*  Clinical Data: Cough.  CHEST - 2 VIEW  Comparison: PA and lateral chest 12/29/2012 and portable chest 01/22/2013.  Findings: There is patchy airspace disease in the right upper lobe most consistent with pneumonia.  The lungs are otherwise clear. Peribronchial thickening is noted.  Heart size is normal.  No pneumothorax or pleural fluid.  IMPRESSION: Bronchitic change with patchy right upper lobe airspace opacity worrisome for pneumonia.   Original Report Authenticated By: Holley Dexter, M.D.     Other results:  EKG (05/04/2013) - Sinus Tachycardia, approximately 100 bpm, normal axis, ST segments: ST depressions laterally.     Assessment & Plan: Pt is a 52 y.o. yo male with a PMHx of COPD who was admitted on 05/04/2013 with symptoms of SOB/cough, which was determined to be secondary to pneumonia.  Community-acquired pneumonia - patient has signs/symptoms consistent with pneumonia and has evidence of a RUL infiltrate. Tachypneic but afebrile in the ED. Leukocytosis with WBC = 12.4.  - Admit to  floor - Monitor vitals - Azithromycin IV - Rocephin IV - Check Legionella/strep urine antigens - Blood cultures x2 - 02 via [place]  - check HIV antibody - repeat CBC/BMET tomorrow AM  COPD exacerbation - secondary to RUL pneumonia as described above. Pt's last exacerbation was in 01/2013. He was discharged home on a prednisone taper and an albuterol inhaler at that time with instructions to establish with a PCP for further evaluation/management, however this was never performed.  - duonebs q6h - prednisone 40mg  QD  HTN - BP moderately elevated in the ED likely 2/2 med noncompliance (pt ran out of all meds ~1 week ago).  - cont losartan  Gout - No evidence of flare.  - cont colchicine    DVT PPX - lovenox  CODE STATUS - full  CONSULTS PLACED - N/A  DISPO - Disposition is deferred at this time, awaiting improvement of CAP.   Anticipated discharge in approximately 1-2 day(s).   The patient does not have a current PCP (Provider Default, MD) and does need an [facility] hospital follow-up appointment after discharge.    Is the [facility] hospital follow-up appointment a one-time only appointment? yes.  Does the patient have transportation limitations that hinder transportation to clinic appointments? unknown   SERVICE NEEDED AT DISCHARGE - TO BE DETERMINED DURING HOSPITAL COURSE         Y = Yes, Blank = No PT:   OT:   RN:   Equipment:   Other:     Signed: Elfredia Nevins, MD  PGY-1, Internal Medicine Resident Pager: [phone]) 05/04/2013, 2:05 PM

## 2013-05-04 NOTE — ED Notes (Signed)
Pt is here with cough, sob, left side pain, and back pain.  No cardiac problems, but reports asthma

## 2013-05-04 NOTE — ED Provider Notes (Signed)
9:20 AM  Date: 05/04/2013  Rate: 103  Rhythm: sinus tachycardia  QRS Axis: normal  Intervals: normal  ST/T Wave abnormalities: Inverted T waves in lateral precordial leads.  Conduction Disutrbances:none  Narrative Interpretation: Abnormal EKG  Old EKG Reviewed: none available    Carleene Cooper III, MD 05/04/13 [phone]

## 2013-05-04 NOTE — Progress Notes (Signed)
Utilization  Review completed.    Darryl Hurley

## 2013-05-04 NOTE — ED Provider Notes (Signed)
History     CSN: [id number]  Arrival date & time 05/04/13  4782   First MD Initiated Contact with Patient 05/04/13 0913      Chief Complaint  Patient presents with  . Shortness of Breath  . Cough  . Back Pain    (Consider location/radiation/quality/duration/timing/severity/associated sxs/prior treatment) HPI Comments: Patient presents emergency department with chief complaints of shortness of breath. He states that he has been short of breath for the past 6 days. He has a history of asthma. He states that he has been coughing up green sputum. He has not tried anything to alleviate her symptoms. He is in mild to moderate respiratory distress. He denies fevers, chills, nausea, vomiting, diarrhea, or constipation.  The history is provided by the patient. No language interpreter was used.    Past Medical History  Diagnosis Date  . Hypertension   . Gout   . Angina pectoris   . COPD (chronic obstructive pulmonary disease)     History reviewed. No pertinent past surgical history.  Family History  Problem Relation Age of Onset  . Diabetes type II Mother   . Depression Father   . Asthma Brother     History  Substance Use Topics  . Smoking status: Current Every Day Smoker    Types: Cigarettes  . Smokeless tobacco: Never Used  . Alcohol Use: 10.5 oz/week    21 drink(s) per week      Review of Systems  All other systems reviewed and are negative.    Allergies  Review of patient's allergies indicates no known allergies.  Home Medications   Current Outpatient Rx  Name  Route  Sig  Dispense  Refill  . albuterol (PROVENTIL HFA;VENTOLIN HFA) 108 (90 BASE) MCG/ACT inhaler   Inhalation   Inhale 2 puffs into the lungs every 6 (six) hours. Do this for 2 days, then use as needed   1 Inhaler   0   . colchicine 0.6 MG tablet   Oral   Take 1 tablet (0.6 mg total) by mouth daily.   30 tablet   1   . ibuprofen (ADVIL,MOTRIN) 200 MG tablet   Oral   Take 400 mg by mouth  every 6 (six) hours as needed for pain.         Marland Kitchen losartan (COZAAR) 50 MG tablet   Oral   Take 1 tablet (50 mg total) by mouth daily.   30 tablet   1     BP 171/97  Pulse 107  Temp(Src) 98.2 F (36.8 C) (Oral)  Resp 31  SpO2 95%  Physical Exam  Nursing note and vitals reviewed. Constitutional: He is oriented to person, place, and time. He appears well-developed and well-nourished.  HENT:  Head: Normocephalic and atraumatic.  Right Ear: External ear normal.  Left Ear: External ear normal.  Nose: Nose normal.  Mouth/Throat: Oropharynx is clear and moist. No oropharyngeal exudate.  Eyes: Conjunctivae and EOM are normal. Pupils are equal, round, and reactive to light. Right eye exhibits no discharge. Left eye exhibits no discharge. No scleral icterus.  Neck: Normal range of motion. Neck supple. No JVD present.  Cardiovascular: Normal rate, regular rhythm, normal heart sounds and intact distal pulses.  Exam reveals no gallop and no friction rub.   No murmur heard. Pulmonary/Chest: Effort normal. No respiratory distress. He has wheezes. He has no rales. He exhibits no tenderness.  Diffuse wheezes and crackles, increased work of breathing  Abdominal: Soft. Bowel sounds are normal. He  exhibits no distension and no mass. There is no tenderness. There is no rebound and no guarding.  Musculoskeletal: Normal range of motion. He exhibits no edema and no tenderness.  Neurological: He is alert and oriented to person, place, and time. He has normal reflexes.  CN 3-12 intact  Skin: Skin is warm and dry.  Psychiatric: He has a normal mood and affect. His behavior is normal. Judgment and thought content normal.    ED Course  Procedures (including critical care time)  Labs Reviewed  BASIC METABOLIC PANEL - Abnormal; Notable for the following:    Glucose, Bld 135 (*)    GFR calc non Af Amer 84 (*)    All other components within normal limits  CBC - Abnormal; Notable for the following:     WBC 12.4 (*)    RDW 15.8 (*)    All other components within normal limits  POCT I-STAT TROPONIN I   Dg Chest 2 View (if Patient Has Fever And/or Copd)  05/04/2013   *RADIOLOGY REPORT*  Clinical Data: Cough.  CHEST - 2 VIEW  Comparison: PA and lateral chest 12/29/2012 and portable chest 01/22/2013.  Findings: There is patchy airspace disease in the right upper lobe most consistent with pneumonia.  The lungs are otherwise clear. Peribronchial thickening is noted.  Heart size is normal.  No pneumothorax or pleural fluid.  IMPRESSION: Bronchitic change with patchy right upper lobe airspace opacity worrisome for pneumonia.   Original Report Authenticated By: Holley Dexter, M.D.     1. CAP (community acquired pneumonia)   2. Essential hypertension, benign   3. Asthma, mild persistent, with acute exacerbation       MDM  Patient was wheezing and shortness of breath. I suspect that the patient might be having asthma exacerbation versus pneumonia. Been ongoing for 6 days.  Chest x-ray is remarkable for patchy infiltrate. Ambulate and reevaluate.  Patient had desaturation of O2 during ambulation. He does not have a primary care physician. I will admit to internal medicine unassigned or CAP.        Roxy Horseman, PA-C 05/04/13 1457

## 2013-05-04 NOTE — ED Notes (Signed)
Pt c/o headache, dizziness.

## 2013-05-04 NOTE — ED Notes (Signed)
Pt given warm lunch meal tray. 

## 2013-05-04 NOTE — ED Notes (Signed)
Pt ambulated in hallway on pulse ox monitor. Tolerated well. Lowest O2 sat 87% on RA. Pt noted to be SOB. Assisted patient onto stretcher. Placed on 2 lpm Reading.

## 2013-05-04 NOTE — Progress Notes (Signed)
05/04/2013 Pharmacy may adjust antibiotic dose based on patient's renal function. Darryl Hurley is a 52 yo man admitted with CAP with RUL infiltrate, WBC 12.4, T max 99.4, HR 102, creat 1.02. Abxs ordered: Azithromycin  Ceftriaxone.  Neither of these require renal dose adjustments. Thanks Herby Abraham, Pharm.D. [phone] 05/04/2013 4:20 PM

## 2013-05-04 NOTE — ED Notes (Signed)
Patient transported to X-ray 

## 2013-05-05 LAB — CBC
Hemoglobin: 12.3 g/dL — ABNORMAL LOW (ref 13.0–17.0)
Platelets: 338 10*3/uL (ref 150–400)
RBC: 4.21 MIL/uL — ABNORMAL LOW (ref 4.22–5.81)
WBC: 13.4 10*3/uL — ABNORMAL HIGH (ref 4.0–10.5)

## 2013-05-05 LAB — LEGIONELLA ANTIGEN, URINE: Legionella Antigen, Urine: NEGATIVE

## 2013-05-05 LAB — BASIC METABOLIC PANEL
Calcium: 9.5 mg/dL (ref 8.4–10.5)
GFR calc Af Amer: >90 mL/min (ref >90)
GFR calc non Af Amer: >90 mL/min (ref >90)
Potassium: 3.6 mEq/L (ref 3.5–5.1)
Sodium: 135 mEq/L (ref 135–145)

## 2013-05-05 MED ORDER — IPRATROPIUM BROMIDE 0.02 % IN SOLN: 0.5000 mg | Freq: Four times a day (QID) | RESPIRATORY_TRACT | Status: DC | PRN

## 2013-05-05 MED ORDER — ALBUTEROL SULFATE (5 MG/ML) 0.5% IN NEBU: 2.5000 mg | INHALATION_SOLUTION | Freq: Four times a day (QID) | RESPIRATORY_TRACT | Status: DC | PRN

## 2013-05-05 NOTE — Care Management Note (Addendum)
    Page 1 of 2   05/12/2013     10:33:10 AM   CARE MANAGEMENT NOTE 05/12/2013  Patient:  Darryl Hurley, Darryl Hurley   Account Number:  [id number]  Date Initiated:  05/05/2013  Documentation initiated by:  Letha Cape  Subjective/Objective Assessment:   dx pna  admit- lives with girlfriend.  pta indep.     Action/Plan:   Anticipated DC Date:  05/06/2013   Anticipated DC Plan:  HOME/SELF CARE  In-house referral  Clinical Social Worker      DC Associate Professor  CM consult  MATCH Program      [facility] Choice  DURABLE MEDICAL EQUIPMENT   Choice offered to / List presented to:  C-1 Patient   DME arranged  [facility]      DME agency  [facility]        Status of service:  Completed, signed off Medicare Important Message given?   (If response is "NO", the following Medicare IM given date fields will be blank) Date Medicare IM given:   Date Additional Medicare IM given:    Discharge Disposition:  HOME/SELF CARE  Per UR Regulation:  Reviewed for med. necessity/level of care/duration of stay  If discussed at Long Length of Stay Meetings, dates discussed:    Comments:  05/12/13 10:27 Letha Cape RN, BSN [phone] [facility] called stating patient went to his internal medicine clinic appt today and he was in distress and they sent him to the [facility].  Patient tells them he has not had any of his medications.  Informed [facility] Rep that pateint has had the Match Program already , MD changed his abx to doxy 100mg  #8 for which meds was $21.92  at [facility] and prednisone was $4 which patient states he could afford, MD was ordering inhalers for patient to take home before dc.  Patient will be coming to [facility] ED because bp is elevated.   05/06/13 11:38 Letha Cape RN, BSN [phone] pateint is for dc today, patient has f/u apt with [facility] and he has paper work to fill out for eligibility for the orange card, informed him to take this to his  appt on 6/11.  Patient states he will need a bus pass, called CSW left vm that pt needs bus pass.  Justin with [facility] will bring oxygen up to patient's room.  Patient states he will need ast with medications, I will ast hime thru the Match Program.  Looks like patient has already used the [employer] so I will not be able to ast him with meds. Informed MD , he will order the albuterol inhaler for patient and change abx to doxy tabs 100mg  #8 which will be $21.92 per [facility] also the prednisone will be $4 at [facility] which the patient states he can afford both of these.  Patient will follow up with the [facility] on  6/10 and they will help him to apply for the orange card as well.  05/05/13 10:42 Letha Cape RN, BSN [phone] patient lives with girlfriend, NCM will continue to follow for dc needs.

## 2013-05-05 NOTE — H&P (Signed)
Internal Medicine Attending Admission Note Date: 05/05/2013  Patient name: Darryl Hurley Medical record number: [id number] Date of birth: 12/26/1960 Age: 52 y.o. Gender: male  I saw and evaluated the patient. I reviewed the resident's note and I agree with the resident's findings and plan as documented in the resident's note, with the following additional comments.  Chief Complaint(s): Cough, shortness of breath  History - key components related to admission: Patient is a 52 year old man with a history of obstructive lung disease (asthma/COPD), hypertension, gout, and other problems as outlined in the medical history admitted with a complaint of shortness of breath with cough productive of green sputum for about one week.  Patient reports that he stopped smoking over a month ago.  He uses an inhaler at home but has recently been out of that medication.   Physical Exam - key components related to admission:  Filed Vitals:   05/04/13 1945 05/04/13 2119 05/05/13 0546 05/05/13 0604  BP: 161/84 146/95  157/85  Pulse: 98 84  71  Temp:  98.4 F (36.9 C)  97.7 F (36.5 C)  TempSrc:  Oral  Oral  Resp:  29  25  Height:      Weight:      SpO2:  99% 97% 92%   General: Alert, no distress Lungs: Mild diffuse expiratory wheezing bilaterally; scattered crackles/rhonchi Heart: Regular; S1-S2, no S3, no S4, no murmur Abdomen: Bowel sounds present, soft, nontender  Extremities: No edema   Lab results:   Basic Metabolic Panel:  Recent Labs  78/29/56 0940 05/05/13 0452  NA 140 135  K 3.9 3.6  CL 100 96  CO2 26 24  GLUCOSE 135* 247*  BUN 9 13  CREATININE 1.02 0.99  CALCIUM 9.7 9.5    CBC:  Recent Labs  05/04/13 0940 05/05/13 0452  WBC 12.4* 13.4*  HGB 13.7 12.3*  HCT 41.3 36.8*  MCV 88.1 87.4  PLT 320 338    Imaging results:  Dg Chest 2 View (if Patient Has Fever And/or Copd)  05/04/2013   *RADIOLOGY REPORT*  Clinical Data: Cough.  CHEST - 2 VIEW  Comparison: PA and  lateral chest 12/29/2012 and portable chest 01/22/2013.  Findings: There is patchy airspace disease in the right upper lobe most consistent with pneumonia.  The lungs are otherwise clear. Peribronchial thickening is noted.  Heart size is normal.  No pneumothorax or pleural fluid.  IMPRESSION: Bronchitic change with patchy right upper lobe airspace opacity worrisome for pneumonia.   Original Report Authenticated By: Holley Dexter, M.D.    Other results: EKG: Sinus tachycardia; ST-T abnormality V5 and V6  Assessment & Plan by Problem:  1.  Pneumonia.  Patient presents with subjective fever, productive cough, and chest x-ray findings consistent with community-acquired pneumonia.  The plan is empiric antibiotic coverage with ceftriaxone and azithromycin pending culture results; supplement oxygen and follow saturations; treat asthma/COPD as noted below.  2.  Obstructive lung disease (asthma/COPD).  Patient reports a history of asthma, and given his prior smoking history COPD is likely.  He has apparently had no pulmonary function studies done.  The plan is to treat pneumonia as above with empiric antibiotics; supplement oxygen and follow saturations; inhaled bronchodilators; steroids; follow peak flows.  Once patient is beyond his acute episode, then pulmonary function studies should be done as an outpatient.  3.  History of gout.  Patient has no active or recent gout symptoms.  4.  History of hyperglycemia on steroids.  Plan is to follow  blood sugar while patient is being treated with systemic steroids.  5.  Other problems and plans as per the resident physician's note.

## 2013-05-05 NOTE — Progress Notes (Signed)
Resident Addendum to Medical Student Note   I have seen and examined the patient, and agree with the the medical student assessment and plan outlined above. Please see my brief note below for additional details.  S: Patient is feeling better this AM. SOB significantly improved. Fever/chills resolved. Still complaining of green sputum production. Denies chest pain.    OBJECTIVE: VS: Reviewed  Meds: Reviewed  Labs: Reviewed  Imaging: Reviewed   Physical Exam: General: Vital signs reviewed and noted. Well-developed, well-nourished, in no acute distress; alert, appropriate and cooperative throughout examination.  HEENT: Normocephalic, atraumatic  Lungs:  Normal WOB. Bibasilar rales. Scattered rhonchi.   Heart: RRR. S1 and S2 normal without gallop, murmur, or rubs.  Abdomen:  BS normoactive. Soft, Nondistended, non-tender.  No masses or organomegaly.  Extremities: No pretibial edema.     ASSESSMENT/ PLAN: Pt is a 52 y.o. male with a PMHx of COPD who was admitted on 05/04/2013 with symptoms of SOB/cough, which was determined to be secondary to pneumonia.  Community-acquired pneumonia - patient improving clinically, however still somewhat tachypneic. Afebrile overnight. O2 sats >90% on [place] @ 3L O2. Leukocytosis persists, however pt is now on corticosteroids which would also account for this. Strep/legionella urine antigens negative. Blood cultures NGTD. Sputum culture pending. HIV negative.  - cont azithro IV - cont rocephin IV  COPD exacerbation - SOB significantly improved. Pt does not appear to have a formal diagnosis of COPD or asthma, although clinically he seems to likely have COPD (also supported by bronchitic changes on CXR).  - cont prednisone 40mg  QD - cont duonebs (switch to PRN) - outpatient spirometry after discharge  HTN - BP slightly elevated this AM but improved since admission. Will continue to monitor and escalate therapy if BP remains elevated.  - cont losartan  Gout -  No evidence of flare.  - cont colchicine  Length of Stay: 1   Lawren Sexson R  05/05/2013, 11:14 AM

## 2013-05-05 NOTE — ED Provider Notes (Signed)
Medical screening examination/treatment/procedure(s) were performed by non-physician practitioner and as supervising physician I was immediately available for consultation/collaboration.   Carleene Cooper III, MD 05/05/13 [phone]

## 2013-05-05 NOTE — Progress Notes (Signed)
SATURATION QUALIFICATIONS: (This note is used to comply with regulatory documentation for home oxygen)  Patient Saturations on 3 Liters of oxygen while Ambulating 91-92%

## 2013-05-05 NOTE — Progress Notes (Signed)
Subjective: Darryl Hurley states his shortness of breath has improved this morning. Satting 92-97% on 3L [place] this AM. Still endorses intermittent cough but now productive of clear sputum. Denies chest pain or abdominal pain.   Objective: Vital signs in last 24 hours: Filed Vitals:   05/04/13 1945 05/04/13 2119 05/05/13 0546 05/05/13 0604  BP: 161/84 146/95  157/85  Pulse: 98 84  71  Temp:  98.4 F (36.9 C)  97.7 F (36.5 C)  TempSrc:  Oral  Oral  Resp:  29  25  Height:      Weight:      SpO2:  99% 97% 92%    Intake/Output Summary (Last 24 hours) at 05/05/13 0957 Last data filed at 05/05/13 0900  Gross per 24 hour  Intake    420 ml  Output    250 ml  Net    170 ml   BP 157/85  Pulse 71  Temp(Src) 97.7 F (36.5 C) (Oral)  Resp 25  Ht 5\' 5"  (1.651 m)  Wt 80.922 kg (178 lb 6.4 oz)  BMI 29.69 kg/m2  SpO2 92%  General: Alert, cooperative, NAD Eyes: Conjunctiva/corneas clear, EOMI Nose: Mucosa normal, no drainage Throat: Lips, mucosa, tongue normal Neck: Supple, no adenopathy, no thyromegaly appreciated, no carotid bruits or JVD Lungs: Normal work of breathing. Coarse breath sounds and rhonchi in bilateral bases R>L, expiratory>inspiratory Chest: No tenderness or deformity Heart: RRR, no murmurs appreciated Abdomen: Normal BS. Soft, nontender. No masses appreciated. Extremities: No LE edema. 2+ pedal pulses bilaterally. Skin: No visible rashes. Neuro: A&O x3, No focal deficits  Lab Results: Basic Metabolic Panel:  Recent Labs Lab 05/04/13 0940 05/05/13 0452  NA 140 135  K 3.9 3.6  CL 100 96  CO2 26 24  GLUCOSE 135* 247*  BUN 9 13  CREATININE 1.02 0.99  CALCIUM 9.7 9.5   CBC:  Recent Labs Lab 05/04/13 0940 05/05/13 0452  WBC 12.4* 13.4*  HGB 13.7 12.3*  HCT 41.3 36.8*  MCV 88.1 87.4  PLT 320 338    Micro Results: Recent Results (from the past 240 hour(s))  CULTURE, EXPECTORATED SPUTUM-ASSESSMENT     Status: None   Collection Time    05/04/13   4:11 PM      Result Value Range Status   Specimen Description SPUTUM   Final   Special Requests NONE   Final   Sputum evaluation     Final   Value: THIS SPECIMEN IS ACCEPTABLE. RESPIRATORY CULTURE REPORT TO FOLLOW.   Report Status 05/04/2013 FINAL   Final   Studies/Results: Dg Chest 2 View (if Patient Has Fever And/or Copd)  05/04/2013   *RADIOLOGY REPORT*  Clinical Data: Cough.  CHEST - 2 VIEW  Comparison: PA and lateral chest 12/29/2012 and portable chest 01/22/2013.  Findings: There is patchy airspace disease in the right upper lobe most consistent with pneumonia.  The lungs are otherwise clear. Peribronchial thickening is noted.  Heart size is normal.  No pneumothorax or pleural fluid.  IMPRESSION: Bronchitic change with patchy right upper lobe airspace opacity worrisome for pneumonia.   Original Report Authenticated By: Holley Dexter, M.D.   Other results:  EKG (05/04/2013) - Sinus Tachycardia, approximately 100 bpm, normal axis, ST segments: ST depressions laterally.  Strep pneumo urinary antigen negative (05/04/13) HIV negative (05/04/13)  Medications: I have reviewed the patient's current medications. Scheduled Meds: . azithromycin  500 mg Intravenous Q24H  . cefTRIAXone (ROCEPHIN)  IV  1 g Intravenous Q24H  . colchicine  0.6 mg Oral Daily  . enoxaparin (LOVENOX) injection  40 mg Subcutaneous Q24H  . losartan  50 mg Oral Daily  . pneumococcal 23 valent vaccine  0.5 mL Intramuscular Tomorrow-1000  . predniSONE  40 mg Oral Q breakfast  . sodium chloride  3 mL Intravenous Q12H   Continuous Infusions:  PRN Meds:.sodium chloride, albuterol, HYDROcodone-acetaminophen, ipratropium, sodium chloride Assessment/Plan: Pt is a 52 y.o. male with a PMHx of COPD who was admitted on 05/04/2013 with symptoms of SOB/cough, which was determined to be secondary to pneumonia.  Principal Problem:   CAP (community acquired pneumonia) Active Problems:   Essential hypertension, benign    Asthma  Community-acquired pneumonia - patient has signs/symptoms consistent with pneumonia and has evidence of a RUL infiltrate. Tachypnea resolving and tachycardia improved. Remains afebrile. Leukocytosis with WBC = 13.4 today though some elevation may be secondary to steroids.  - Monitor vitals, pulse ox with ambulation  - Azithromycin IV  - Rocephin IV  - Legionella urinary antigen pending - Strep pneumo urinary antigen negative - Blood cultures x2 and sputum cultures pending - 02 via [place] currently on 3L, wean as tolerated and encouraged ambulation  - HIV negative - repeat CBC/BMET tomorrow AM   COPD exacerbation - secondary to RUL pneumonia as described above. Pt's last exacerbation was in 01/2013. He was discharged home on a prednisone taper and an albuterol inhaler at that time with instructions to establish with a PCP for further evaluation/management, however this was never performed.  - duonebs ipratropium and albuterol q6hr PRN - s/p solumedrol 125mg  yesterday - prednisone 40mg  QD, planned 5 day course with taper  - pneumovax today  HTN - BP moderately elevated in the ED likely 2/2 med noncompliance (pt ran out of all meds ~1 week ago).  - cont losartan   Gout - No evidence of flare.  - cont colchicine   DVT PPX - lovenox  CODE STATUS - full CONSULTS PLACED - N/A DISPO - Awaiting improvement of CAP, likely 05/06/13.  Anticipated discharge in approximately 1-2 day(s).  The patient does not have a current PCP (Provider Default, MD) and does need an [facility] hospital follow-up appointment after discharge.  Is the [facility] hospital follow-up appointment a one-time only appointment? yes.  Does the patient have transportation limitations that hinder transportation to clinic appointments? unknown  This is a Psychologist, occupational Note.  The care of the patient was discussed with Dr. Meredith Pel and Dr. Lavena Bullion and the assessment and plan formulated with their assistance.  Please see their attached note  for official documentation of the daily encounter.   LOS: 1 day   [name], Med Student 05/05/2013, 9:57 AM

## 2013-05-05 NOTE — Progress Notes (Signed)
Inpatient Diabetes Program Recommendations  AACE/ADA: New Consensus Statement on Inpatient Glycemic Control (2013)  Target Ranges:  Prepandial:   less than 140 mg/dL      Peak postprandial:   less than 180 mg/dL (1-2 hours)      Critically ill patients:  140 - 180 mg/dL   Inpatient Diabetes Program Recommendations Correction (SSI): start sensitive scale TID during steroid therapy HgbA1C: =6.7 (can be diagnosed with A1C 6.5 or greater) Outpatient Referral: will need follow up as outpatient for elevated A1C Thank you  Darryl Hurley BSN, RN,CDE Inpatient Diabetes Coordinator [phone] (team pager)

## 2013-05-06 DIAGNOSIS — J441 Chronic obstructive pulmonary disease with (acute) exacerbation: Secondary | ICD-10-CM

## 2013-05-06 MED ORDER — TIOTROPIUM BROMIDE MONOHYDRATE 18 MCG IN CAPS: 18.0000 ug | ORAL_CAPSULE | Freq: Every day | RESPIRATORY_TRACT | Status: DC

## 2013-05-06 MED ORDER — DOXYCYCLINE MONOHYDRATE 100 MG PO CAPS: 100.0000 mg | ORAL_CAPSULE | Freq: Two times a day (BID) | ORAL | Status: DC

## 2013-05-06 MED ORDER — ALBUTEROL SULFATE HFA 108 (90 BASE) MCG/ACT IN AERS: 2.0000 | INHALATION_SPRAY | Freq: Four times a day (QID) | RESPIRATORY_TRACT | Status: DC | PRN

## 2013-05-06 MED ORDER — PNEUMOCOCCAL VAC POLYVALENT 25 MCG/0.5ML IJ INJ
0.5000 mL | INJECTION | Freq: Once | INTRAMUSCULAR | Status: AC
  Administered 2013-05-06: 0.5 mL via INTRAMUSCULAR
  Filled 2013-05-06: qty 0.5

## 2013-05-06 MED ORDER — LEVOFLOXACIN 750 MG PO TABS
750.0000 mg | ORAL_TABLET | Freq: Once | ORAL | Status: AC
  Administered 2013-05-06: 750 mg via ORAL
  Filled 2013-05-06: qty 1

## 2013-05-06 MED ORDER — LOSARTAN POTASSIUM 50 MG PO TABS: 50.0000 mg | ORAL_TABLET | Freq: Every day | ORAL | Status: DC

## 2013-05-06 MED ORDER — LEVOFLOXACIN 750 MG PO TABS: 750.0000 mg | ORAL_TABLET | Freq: Every day | ORAL | Status: DC

## 2013-05-06 MED ORDER — ALBUTEROL SULFATE HFA 108 (90 BASE) MCG/ACT IN AERS: 2.0000 | INHALATION_SPRAY | RESPIRATORY_TRACT | Status: DC | PRN

## 2013-05-06 MED ORDER — PREDNISONE 20 MG PO TABS: 40.0000 mg | ORAL_TABLET | Freq: Every day | ORAL | Status: DC

## 2013-05-06 NOTE — Progress Notes (Signed)
Internal Medicine Attending  Date: 05/06/2013  Patient name: Darryl Hurley Medical record number: [id number] Date of birth: 1961-10-16 Age: 52 y.o. Gender: male  I saw and evaluated the patient on a.m. rounds with house staff. I reviewed the resident's note by Dr. Lavena Bullion and I agree with the resident's findings and plans as documented in his note.

## 2013-05-06 NOTE — Discharge Summary (Deleted)
Physician Discharge Summary  Patient ID: Darryl Hurley MRN: [id number] DOB/AGE: 51-Jul-1962 51 y.o.  Admit date: 05/04/2013 Discharge date: 05/06/2013  Admission Diagnoses: Community Acquired Pneumonia, Asthma/COPD   Discharge Diagnoses: Community Acquired Pneumonia, Asthma/COPD  Hospital Course: Darryl Hurley is a 52 yo gentleman with PMH of asthma who presented on 05/04/13 with a 6 day history of cough and SOB and was found to have RUL pneumonia on CXR. HIV, Strep, and Legionella testing were negative. He was started on IV azithromycin and ceftriaxone, ipratropium and albuterol nebulizers q6h, and placed on 3L O2 by [place]. By HD 1, his duonebs were transitioned to PRN. For a presumed COPD exacerbation, he received solumedrol 125mg  and was started on prednisone 40mg  qd with a planned 5 day course with taper. He also received pneumovax. His home losartan and colchicine were continued to treat his HTN and gout. He is being sent home on levofloxacin 750mg  qd for a 7 day course (6/4-6/10). Because of desats to the 80s when his O2 was removed, he is being sent home on 2L O2 [place]. Blood cultures were negative and respiratory cultures were remarkable for normal oropharyngeal flora at the time of discharge. Darryl Hurley is being discharged on HD 2 in stable condition and will follow-up in clinic in 2 weeks for outpatient spirometry.       Discharge Exam: Blood pressure 166/98, pulse 10, temperature 98 F (36.7 C), temperature source Oral, resp. rate 22, height 5\' 5"  (1.651 m), weight 80.922 kg (178 lb 6.4 oz), SpO2 93.00%.  General: Alert, cooperative, NAD  Eyes: Conjunctiva/corneas clear, EOMI  Nose: Mucosa normal, no drainage  Throat: Lips, mucosa, tongue normal  Neck: Supple, no adenopathy, no thyromegaly appreciated, no carotid bruits or JVD  Lungs: Normal work of breathing. Minimal scattered rhonchi and crackles. Chest: No tenderness or deformity  Heart: RRR, no murmurs appreciated  Abdomen: Normal BS. Soft,  nontender. No masses appreciated.  Extremities: No LE edema. 2+ pedal pulses bilaterally.  Skin: No visible rashes.  Neuro: A&O x3, No focal deficits   Disposition: 01-Home or Self Care     Medication List    ASK your doctor about these medications       albuterol 108 (90 BASE) MCG/ACT inhaler  Commonly known as:  PROVENTIL HFA;VENTOLIN HFA  Inhale 2 puffs into the lungs every 6 (six) hours. Do this for 2 days, then use as needed     colchicine 0.6 MG tablet  Take 1 tablet (0.6 mg total) by mouth daily.     ibuprofen 200 MG tablet  Commonly known as:  ADVIL,MOTRIN  Take 400 mg by mouth every 6 (six) hours as needed for pain.     losartan 50 MG tablet  Commonly known as:  COZAAR  Take 1 tablet (50 mg total) by mouth daily.         Signed: [name] 05/06/2013, 10:41 AM

## 2013-05-06 NOTE — Progress Notes (Signed)
RN checked pt 02 SAT after MD removed 02 for recheck of 02 SAT - 02 SAT on RA after 5 minutes 84%. Pt placed back on 2L 02

## 2013-05-06 NOTE — Progress Notes (Signed)
Pt 02 SAT on RA 80% pt ambulated approx 300 ft 02 SAT 84-86% no c/o sob or cp, tolerated well, pt resting 02 SAT 82%. Pt placed back on 2 L [place] 02 SAT 96% after 2 minutes rest on O2.

## 2013-05-06 NOTE — Progress Notes (Signed)
NURSING PROGRESS NOTE  Darryl Hurley [id number] Discharge Data: 05/06/2013 11:42 AM Attending Provider: Farley Ly, MD WUJ:[id number], Provider, MD     [name] to be D/C'd Home per MD order.  Discussed with the patient the After Visit Summary and all questions fully answered. All IV's discontinued with no bleeding noted. All belongings returned to patient for patient to take home.   Last Vital Signs:  Blood pressure 166/98, pulse 10, temperature 98 F (36.7 C), temperature source Oral, resp. rate 22, height 5\' 5"  (1.651 m), weight 80.922 kg (178 lb 6.4 oz), SpO2 93.00%.  Discharge Medication List   Medication List    TAKE these medications       albuterol 108 (90 BASE) MCG/ACT inhaler  Commonly known as:  PROVENTIL HFA;VENTOLIN HFA  Inhale 2 puffs into the lungs every 6 (six) hours as needed for wheezing.     colchicine 0.6 MG tablet  Take 1 tablet (0.6 mg total) by mouth daily.     ibuprofen 200 MG tablet  Commonly known as:  ADVIL,MOTRIN  Take 400 mg by mouth every 6 (six) hours as needed for pain.     levofloxacin 750 MG tablet  Commonly known as:  LEVAQUIN  Take 1 tablet (750 mg total) by mouth daily.  Start taking on:  05/07/2013     losartan 50 MG tablet  Commonly known as:  COZAAR  Take 1 tablet (50 mg total) by mouth daily.     predniSONE 20 MG tablet  Commonly known as:  DELTASONE  Take 2 tablets (40 mg total) by mouth daily with breakfast.     tiotropium 18 MCG inhalation capsule  Commonly known as:  SPIRIVA  Place 1 capsule (18 mcg total) into inhaler and inhale daily.

## 2013-05-06 NOTE — Discharge Summary (Signed)
Patient Name: ARDELL MAKAREWICZ  MRN:  [id number]   DOB: 18-Jan-1961   PCP: Provider Default, MD         Date of Admission: 05/04/2013  Date of Discharge: 05/06/2013        Attending Physician: Farley Ly, MD      DISCHARGE DIAGNOSES: Community-acquired pneumonia COPD with exacerbation HTN Gout   DISPOSITION AND FOLLOW-UP: BRADAN CONGROVE is to follow-up with the listed providers as detailed below, at which time, the following should be addressed:   1. Assess compliance with antibiotics, steroids, and bronchodilators. Assess O2 saturation on RA and determine whether the patient will need ongoing home O2.  2. F/u CXR in 4-6 weeks to assess for resolution of PNA.  3. Labs / imaging needed at time of follow-up: N/A.  4. Pending labs/ test needing follow-up: Blood cultures.    DISCHARGE INSTRUCTIONS: Follow-up Information   Follow up with [facility] On 05/13/2013. (bring $20 copay with photo id and medicaitons)    Contact information:   [address] [place] [phone]       Follow up with Janalyn Harder, MD On 05/12/2013. (8:45AM)    Contact information:   [address] [place] [address] [phone]          Discharge Orders   Future Appointments Provider Department Dept Phone   05/12/2013 8:45 AM Linward Headland, MD [facility] [phone]   05/13/2013 1:00 PM [facility] Covering Provider [facility] [phone]   Future Orders Complete By Expires     Diet - low sodium heart healthy  As directed     For home use only DME oxygen  As directed     Questions:      Mode or (Route):  Nasal cannula    Liters per Minute:  2    Frequency:  Continuous    Oxygen conserving device:      Increase activity slowly  As directed         DISCHARGE MEDICATIONS:   Medication List    TAKE these medications       albuterol 108 (90 BASE) MCG/ACT  inhaler  Commonly known as:  PROVENTIL HFA;VENTOLIN HFA  Inhale 2 puffs into the lungs every 6 (six) hours as needed for wheezing.     colchicine 0.6 MG tablet  Take 1 tablet (0.6 mg total) by mouth daily.     ibuprofen 200 MG tablet  Commonly known as:  ADVIL,MOTRIN  Take 400 mg by mouth every 6 (six) hours as needed for pain.     levofloxacin 750 MG tablet  Commonly known as:  LEVAQUIN  Take 1 tablet (750 mg total) by mouth daily.  Start taking on:  05/07/2013     losartan 50 MG tablet  Commonly known as:  COZAAR  Take 1 tablet (50 mg total) by mouth daily.     predniSONE 20 MG tablet  Commonly known as:  DELTASONE  Take 2 tablets (40 mg total) by mouth daily with breakfast.     tiotropium 18 MCG inhalation capsule  Commonly known as:  SPIRIVA  Place 1 capsule (18 mcg total) into inhaler and inhale daily.        PROCEDURES PERFORMED:  Dg Chest 2 View (if Patient Has Fever And/or Copd)  05/04/2013   *RADIOLOGY REPORT*  Clinical Data: Cough.  CHEST - 2 VIEW  Comparison: PA and lateral chest 12/29/2012 and portable chest 01/22/2013.  Findings: There is patchy airspace disease in the right upper lobe most consistent with pneumonia.  The lungs are otherwise clear. Peribronchial thickening is noted.  Heart size is normal.  No pneumothorax or pleural fluid.  IMPRESSION: Bronchitic change with patchy right upper lobe airspace opacity worrisome for pneumonia.   Original Report Authenticated By: Holley Dexter, M.D.       ADMISSION DATA: H&P:  Patient is a 52 y.o. male with a PMHx of COPD, gout, HTN who presents to [facility] for evaluation of shortness of breath and cough. Patient states that he developed a cough productive of green sputum approximately 6 days ago, after which he began to develop slowly and progressively worsening shortness of breath. He admits to fever and chills for the same period of time. The patient states that he has an albuterol inhaler for COPD/asthma but states that  he ran out of this medication prior to his symptoms starting, and does not have any other inhalers. Mr. Sesler states that his girlfriend was sick with similar symptoms of SOB/cough and subsequently improved after being prescribed a Z-Pak.  Mr. Molesworth is a long-time former smoker, however he states he recently quit smoking. He denies any alcohol or drug use.  Physical Exam: General: Vital signs reviewed and noted. Well-developed, well-nourished, in no acute distress; alert, appropriate and cooperative throughout examination.  Head: Normocephalic, atraumatic.  Eyes: PERRL, EOMI, No signs of anemia or jaundince.  Nose: Mucous membranes moist, not inflammed, nonerythematous.  Throat: Oropharynx nonerythematous, no exudate appreciated.  Neck: No deformities, masses, or tenderness noted.Supple, No carotid Bruits, no JVD.  Lungs: Normal respiratory effort. Clear to auscultation BL without crackles or wheezes.  Heart: RRR. S1 and S2 normal without gallop, murmur, or rubs.  Abdomen: BS normoactive. Soft, Nondistended, non-tender. No masses or organomegaly.  Extremities: No pretibial edema.  Neurologic: A&O X3, CN II - XII are grossly intact. Motor strength is 5/5 in the all 4 extremities, Sensations intact to light touch, Cerebellar signs negative.  Skin: No visible rashes, scars.    Labs: Comprehensive Metabolic Panel:    Component  Value  Date/Time    NA  140  05/04/2013 0940    K  3.9  05/04/2013 0940    CL  100  05/04/2013 0940    CO2  26  05/04/2013 0940    BUN  9  05/04/2013 0940    CREATININE  1.02  05/04/2013 0940    GLUCOSE  135*  05/04/2013 0940    CALCIUM  9.7  05/04/2013 0940    AST  23  01/22/2013 1911    ALT  19  01/22/2013 1911    ALKPHOS  59  01/22/2013 1911    BILITOT  0.3  01/22/2013 1911    PROT  7.5  01/22/2013 1911    ALBUMIN  3.0*  01/22/2013 1911    CBC:    Component  Value  Date/Time    WBC  12.4*  05/04/2013 0940    HGB  13.7  05/04/2013 0940    HCT  41.3  05/04/2013 0940    PLT  320   05/04/2013 0940    MCV  88.1  05/04/2013 0940    NEUTROABS  4.6  01/22/2013 1911    LYMPHSABS  2.5  01/22/2013 1911    MONOABS  0.4  01/22/2013 1911    EOSABS  0.0  01/22/2013 1911    BASOSABS  0.0  01/22/2013 1911    Drugs of Abuse    Component  Value  Date/Time    LABOPIA  NONE DETECTED  01/22/2013 1922    COCAINSCRNUR  NONE DETECTED  01/22/2013 1922    LABBENZ  NONE DETECTED  01/22/2013 1922    AMPHETMU  NONE DETECTED  01/22/2013 1922    THCU  NONE DETECTED  01/22/2013 1922    LABBARB  NONE DETECTED  01/22/2013 1922    Troponin (Point of Care Test)   Recent Labs   05/04/13 0950   TROPIPOC  0.00    Historical Labs:  Lab Results   Component  Value  Date    HGBA1C  6.7*  01/20/2013    No results found for this basename: CHOL, HDL, LDLCALC, LDLDIRECT, TRIG, CHOLHDL    No results found for this basename: TSH, T3TOTAL, T4TOTAL, THYROIDAB      HOSPITAL COURSE: Community-acquired pneumonia - patient presented with signs/symptoms of pneumonia and CXR revealed a RUL infiltrate. He was tachypneic and dyspneic on admission, however he began improving rapidly after beginning antibiotic therapy with IV azithromycin and ceftriaxone. Although the patient's dyspnea had nearly resolved at the time of discharge, he continued to have low O2 saturations (84% on RA) and was thus discharged with home O2. He was transitioned to oral levaquin and was instructed to complete a total 7 day course of antibiotics.   COPD with exacerbation - secondary to CAP. Pt was given solumedrol on admission and was then started on prednisone 40mg  daily which he was instructed to continue at discharge (5d course). He had wheezing on admission which resolved during his hospital course with steroids and bronchodilators. He was discharged with an albuterol inhaler and also with a prescription for tiotropium. He will need outpatient PFTs in the future to confirm his suspected diagnosis of COPD (as supported by clinical evidence,  including CXR with bronchitic changes, although his smoking history is not as extensive as would usually be seen with COPD patients).   HTN - Pt was hypertensive on admission, likely secondary to noncompliance with losartan. His anti-HTN (losartan) was restarted after admission, however the pt remained mildly hypertensive, likely secondary to corticosteroids. At f/u, recommend increasing anti-HTN therapy if he remains hypertensive after completing his short prednisone course (if deemed compliant with losartan as prescribed).   Gout - no evidence of flare. Colchicine was continued during hospital course.    DISCHARGE DATA: Vital Signs: BP 166/98  Pulse 10  Temp(Src) 98 F (36.7 C) (Oral)  Resp 22  Ht 5\' 5"  (1.651 m)  Wt 178 lb 6.4 oz (80.922 kg)  BMI 29.69 kg/m2  SpO2 93%  Labs: No results found for this or any previous visit (from the past 24 hour(s)).   Time spent on discharge: 25 minutes  Services Ordered on Discharge: Y = Yes; Blank = No PT:   OT:   RN:   Equipment:   Other:     Signed: Elfredia Nevins, MD   PGY 1, Internal Medicine Resident 05/06/2013, 11:35 AM

## 2013-05-06 NOTE — Progress Notes (Signed)
Subjective:    Patient feels much better today. SOB much improved. He was able to ambulate in the hallway without any dyspnea. States he is ready to go home.   Interval Events: No acute events.    Objective:    Vital Signs:   Temp:  [98 F (36.7 C)-98.3 F (36.8 C)] 98 F (36.7 C) (06/04 0659) Pulse Rate:  [10-84] 10 (06/04 0659) Resp:  [21-22] 22 (06/04 0659) BP: (145-166)/(84-98) 166/98 mmHg (06/04 0659) SpO2:  [93 %-97 %] 93 % (06/04 0659) Last BM Date: 05/05/13  24-hour weight change: Weight change:   Intake/Output:   Intake/Output Summary (Last 24 hours) at 05/06/13 1037 Last data filed at 05/05/13 2008  Gross per 24 hour  Intake    702 ml  Output    700 ml  Net      2 ml      Physical Exam: General: Vital signs reviewed and noted. Well-developed, well-nourished, in no acute distress; alert, appropriate and cooperative throughout examination.  HEENT: Normocephalic, atraumatic  Lungs: Normal WOB. Bibasilar rales. Scattered rhonchi.  Heart: RRR. S1 and S2 normal without gallop, murmur, or rubs.  Abdomen: BS normoactive. Soft, Nondistended, non-tender. No masses or organomegaly.  Extremities: No pretibial edema.   Labs:  Basic Metabolic Panel:  Recent Labs Lab 05/04/13 0940 05/05/13 0452  NA 140 135  K 3.9 3.6  CL 100 96  CO2 26 24  GLUCOSE 135* 247*  BUN 9 13  CREATININE 1.02 0.99  CALCIUM 9.7 9.5    CBC:  Recent Labs Lab 05/04/13 0940 05/05/13 0452  WBC 12.4* 13.4*  HGB 13.7 12.3*  HCT 41.3 36.8*  MCV 88.1 87.4  PLT 320 338    Microbiology: Results for orders placed during the hospital encounter of 05/04/13  CULTURE, EXPECTORATED SPUTUM-ASSESSMENT     Status: None   Collection Time    05/04/13  4:11 PM      Result Value Range Status   Specimen Description SPUTUM   Final   Special Requests NONE   Final   Sputum evaluation     Final   Value: THIS SPECIMEN IS ACCEPTABLE. RESPIRATORY CULTURE REPORT TO FOLLOW.   Report Status  05/04/2013 FINAL   Final  CULTURE, RESPIRATORY (NON-EXPECTORATED)     Status: None   Collection Time    05/04/13  4:11 PM      Result Value Range Status   Specimen Description SPUTUM   Final   Special Requests NONE   Final   Gram Stain     Final   Value: RARE WBC PRESENT, PREDOMINANTLY PMN     RARE SQUAMOUS EPITHELIAL CELLS PRESENT     RARE GRAM NEGATIVE RODS     RARE GRAM POSITIVE COCCI     IN PAIRS RARE GRAM POSITIVE RODS   Culture NORMAL OROPHARYNGEAL FLORA   Final   Report Status PENDING   Incomplete  CULTURE, BLOOD (ROUTINE X 2)     Status: None   Collection Time    05/04/13  5:50 PM      Result Value Range Status   Specimen Description BLOOD RIGHT ARM   Final   Special Requests BOTTLES DRAWN AEROBIC AND ANAEROBIC 10CC   Final   Culture  Setup Time 05/05/2013 01:03   Final   Culture     Final   Value:        BLOOD CULTURE RECEIVED NO GROWTH TO DATE CULTURE WILL BE HELD FOR 5 DAYS BEFORE ISSUING A  FINAL NEGATIVE REPORT   Report Status PENDING   Incomplete  CULTURE, BLOOD (ROUTINE X 2)     Status: None   Collection Time    05/04/13  6:15 PM      Result Value Range Status   Specimen Description BLOOD RIGHT ARM   Final   Special Requests BOTTLES DRAWN AEROBIC AND ANAEROBIC 10CC   Final   Culture  Setup Time 05/05/2013 01:03   Final   Culture     Final   Value:        BLOOD CULTURE RECEIVED NO GROWTH TO DATE CULTURE WILL BE HELD FOR 5 DAYS BEFORE ISSUING A FINAL NEGATIVE REPORT   Report Status PENDING   Incomplete      Medications:    Infusions:    Scheduled Medications: . azithromycin  500 mg Intravenous Q24H  . cefTRIAXone (ROCEPHIN)  IV  1 g Intravenous Q24H  . colchicine  0.6 mg Oral Daily  . enoxaparin (LOVENOX) injection  40 mg Subcutaneous Q24H  . losartan  50 mg Oral Daily  . predniSONE  40 mg Oral Q breakfast  . sodium chloride  3 mL Intravenous Q12H    PRN Medications: sodium chloride, albuterol, HYDROcodone-acetaminophen, ipratropium, sodium  chloride   Assessment/ Plan:   Pt is a 52 y.o. male with a PMHx of COPD who was admitted on 05/04/2013 with symptoms of SOB/cough, which was determined to be secondary to pneumonia.   Community-acquired pneumonia - SOB much improved. O2 sats are 84% on RA at rest (no dyspnea). Pt therefore will need home O2 in addition to antibiotics.  - d/c IV abx - start levaquin - home O2 at discharge  COPD exacerbation - SOB significantly improved. Pt does not appear to have a formal diagnosis of COPD or asthma, although clinically he seems to likely have COPD (also supported by bronchitic changes on CXR).  - cont prednisone 40mg  QD  - cont duonebs - outpatient spirometry after discharge   HTN - BP remains slightly elevated today. Likely 2/2 to corticosteroids. - cont losartan  Gout - No evidence of flare.  - cont colchicine   Length of Stay: 2 day(s)   Signed: Elfredia Nevins, MD  PGY-1, Internal Medicine Resident Pager: [phone] (7AM-5PM) 05/06/2013, 10:37 AM

## 2013-05-07 LAB — CULTURE, RESPIRATORY W GRAM STAIN: Culture: NORMAL

## 2013-05-11 LAB — CULTURE, BLOOD (ROUTINE X 2): Culture: NO GROWTH

## 2013-05-12 ENCOUNTER — Observation Stay (HOSPITAL_COMMUNITY)
Admission: EM | Admit: 2013-05-12 | Discharge: 2013-05-13 | Disposition: A | Discharge status: Home / Self Care | Payer: Medicaid Other | Attending: Internal Medicine | Admitting: Internal Medicine

## 2013-05-12 ENCOUNTER — Encounter (HOSPITAL_COMMUNITY): Payer: Self-pay | Admitting: Emergency Medicine

## 2013-05-12 ENCOUNTER — Ambulatory Visit: Payer: Medicaid Other | Attending: Family Medicine | Admitting: Family Medicine

## 2013-05-12 ENCOUNTER — Emergency Department (HOSPITAL_COMMUNITY): Payer: Medicaid Other

## 2013-05-12 ENCOUNTER — Ambulatory Visit: Payer: Self-pay | Admitting: Internal Medicine

## 2013-05-12 ENCOUNTER — Encounter: Payer: Self-pay | Admitting: Family Medicine

## 2013-05-12 VITALS — BP 187/124 | HR 94 | Temp 97.8°F | Wt 190.0 lb

## 2013-05-12 DIAGNOSIS — J4489 Other specified chronic obstructive pulmonary disease: Secondary | ICD-10-CM | POA: Insufficient documentation

## 2013-05-12 DIAGNOSIS — I160 Hypertensive urgency: Secondary | ICD-10-CM

## 2013-05-12 DIAGNOSIS — R0602 Shortness of breath: Secondary | ICD-10-CM

## 2013-05-12 DIAGNOSIS — M109 Gout, unspecified: Secondary | ICD-10-CM | POA: Insufficient documentation

## 2013-05-12 DIAGNOSIS — J189 Pneumonia, unspecified organism: Secondary | ICD-10-CM | POA: Insufficient documentation

## 2013-05-12 DIAGNOSIS — J449 Chronic obstructive pulmonary disease, unspecified: Secondary | ICD-10-CM

## 2013-05-12 DIAGNOSIS — I10 Essential (primary) hypertension: Secondary | ICD-10-CM

## 2013-05-12 DIAGNOSIS — R519 Headache, unspecified: Secondary | ICD-10-CM | POA: Diagnosis present

## 2013-05-12 DIAGNOSIS — E876 Hypokalemia: Secondary | ICD-10-CM | POA: Insufficient documentation

## 2013-05-12 HISTORY — DX: Low back pain, unspecified: M54.50

## 2013-05-12 HISTORY — DX: Low back pain: M54.5

## 2013-05-12 HISTORY — DX: Shortness of breath: R06.02

## 2013-05-12 HISTORY — DX: Unspecified asthma, uncomplicated: J45.909

## 2013-05-12 HISTORY — DX: Unspecified osteoarthritis, unspecified site: M19.90

## 2013-05-12 HISTORY — DX: Other chronic pain: G89.29

## 2013-05-12 LAB — CBC
MCH: 29.2 pg (ref 26.0–34.0)
MCV: 86.4 fL (ref 78.0–100.0)
Platelets: 405 10*3/uL — ABNORMAL HIGH (ref 150–400)
RBC: 4.56 MIL/uL (ref 4.22–5.81)

## 2013-05-12 LAB — BASIC METABOLIC PANEL
CO2: 26 mEq/L (ref 19–32)
Calcium: 9.3 mg/dL (ref 8.4–10.5)
Glucose, Bld: 106 mg/dL — ABNORMAL HIGH (ref 70–99)
Sodium: 138 mEq/L (ref 135–145)

## 2013-05-12 IMAGING — CR DG CHEST 2V
2 series · 2 of 2 positions shown · non-contrast
Comparison: [DATE]

CLINICAL DATA: Shortness of breath; chest pain

CHEST - 2 VIEW

[w chest lat]
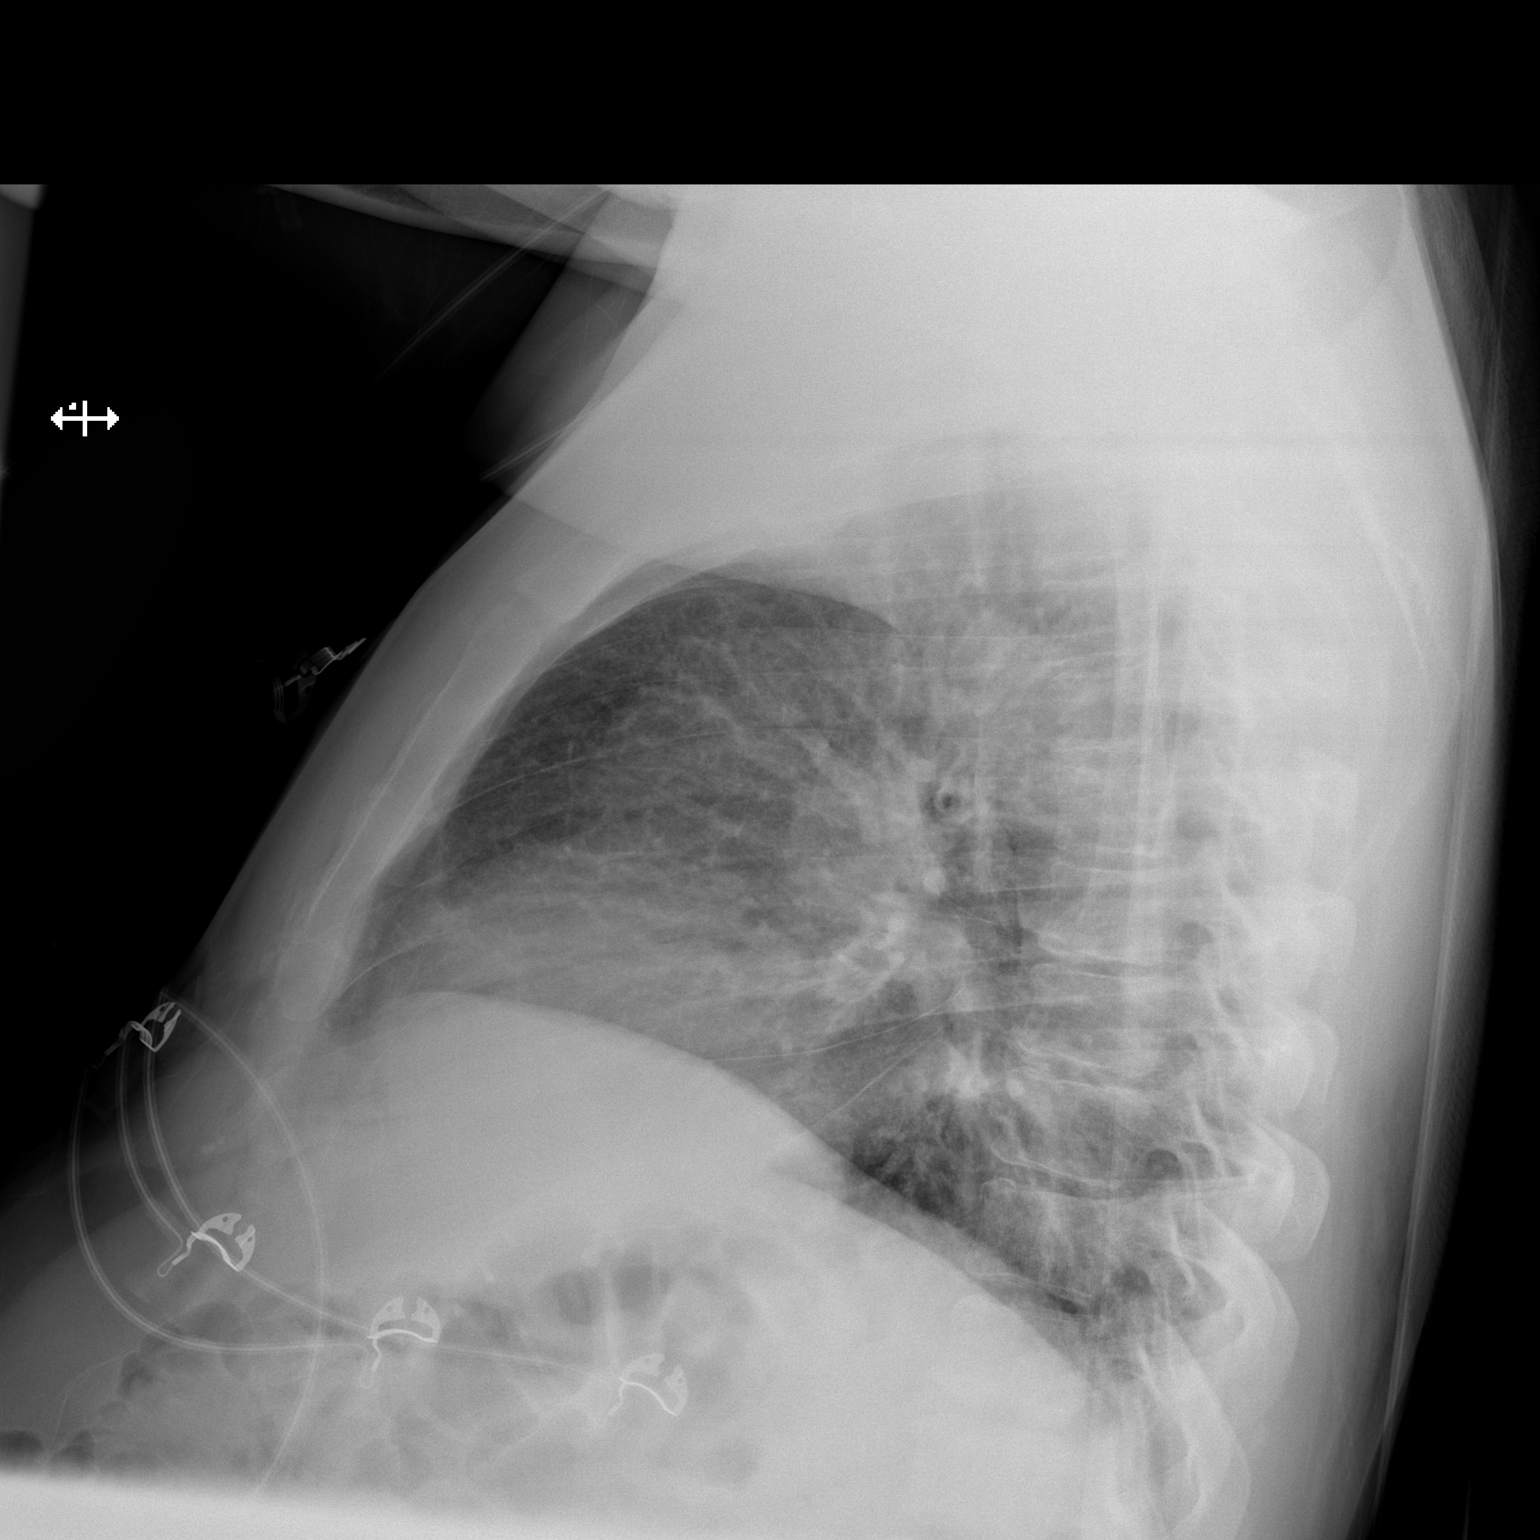

[x chest ap]
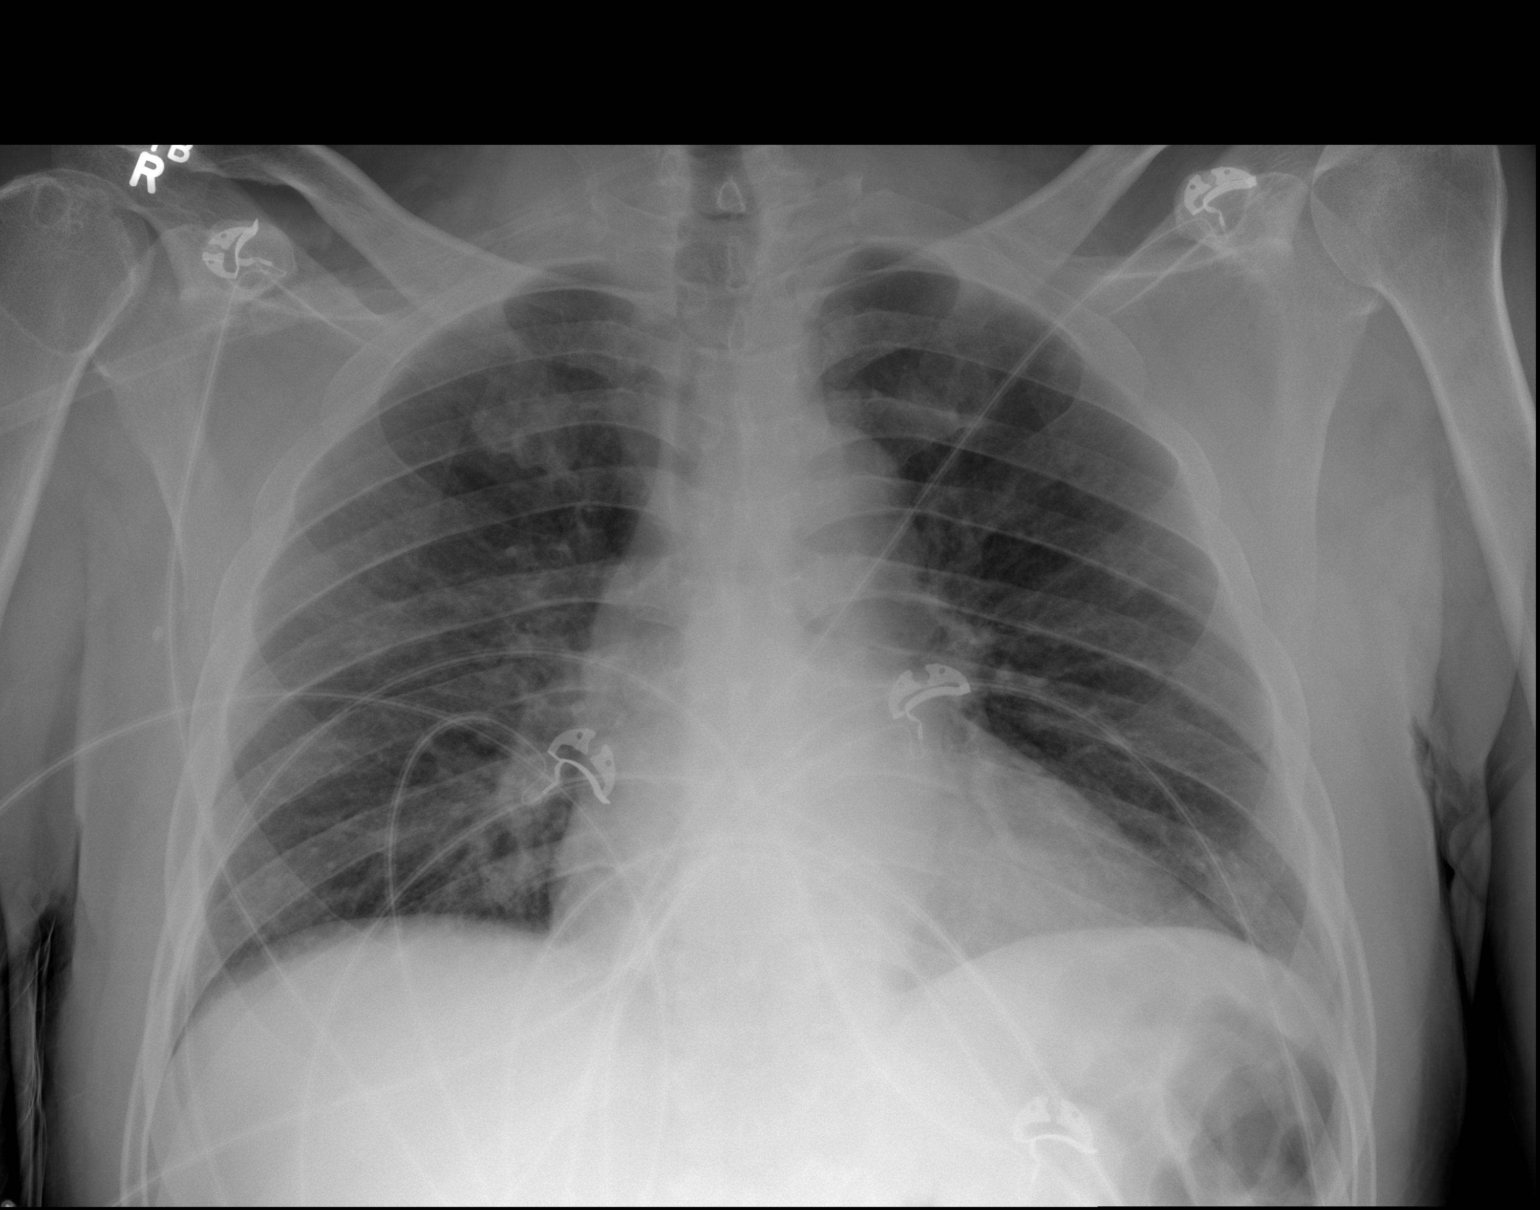

[2 of 2 positions shown; findings below may reference images not displayed]

FINDINGS: There has been interval clearing of infiltrate from the
right upper lobe.  Currently, the lungs are clear.  Heart is upper
normal in size with normal pulmonary vascularity.  No adenopathy.
No bone lesions.  No pneumothorax.
IMPRESSION: Currently, lungs are clear.  There has been interval clearing of
consolidation from the right upper lobe.

## 2013-05-12 MED ORDER — ENOXAPARIN SODIUM 40 MG/0.4ML ~~LOC~~ SOLN
40.0000 mg | SUBCUTANEOUS | Status: DC
  Administered 2013-05-12: 40 mg via SUBCUTANEOUS
  Filled 2013-05-12 (×2): qty 0.4

## 2013-05-12 MED ORDER — LEVOFLOXACIN 750 MG PO TABS
750.0000 mg | ORAL_TABLET | Freq: Every day | ORAL | Status: DC
  Administered 2013-05-12 – 2013-05-13 (×2): 750 mg via ORAL
  Filled 2013-05-12 (×2): qty 1

## 2013-05-12 MED ORDER — TIOTROPIUM BROMIDE MONOHYDRATE 18 MCG IN CAPS
18.0000 ug | ORAL_CAPSULE | Freq: Every day | RESPIRATORY_TRACT | Status: DC
  Administered 2013-05-13: 18 ug via RESPIRATORY_TRACT
  Filled 2013-05-12: qty 5

## 2013-05-12 MED ORDER — HYDROCHLOROTHIAZIDE 12.5 MG PO CAPS
12.5000 mg | ORAL_CAPSULE | Freq: Every day | ORAL | Status: DC
  Administered 2013-05-12 – 2013-05-13 (×2): 12.5 mg via ORAL
  Filled 2013-05-12 (×2): qty 1

## 2013-05-12 MED ORDER — IBUPROFEN 800 MG PO TABS
800.0000 mg | ORAL_TABLET | ORAL | Status: DC | PRN
  Administered 2013-05-12 – 2013-05-13 (×2): 800 mg via ORAL
  Filled 2013-05-12 (×4): qty 1

## 2013-05-12 MED ORDER — LISINOPRIL 10 MG PO TABS
10.0000 mg | ORAL_TABLET | Freq: Every day | ORAL | Status: DC
  Administered 2013-05-12 – 2013-05-13 (×2): 10 mg via ORAL
  Filled 2013-05-12 (×2): qty 1

## 2013-05-12 MED ORDER — ALBUTEROL SULFATE HFA 108 (90 BASE) MCG/ACT IN AERS: 1.0000 | INHALATION_SPRAY | Freq: Four times a day (QID) | RESPIRATORY_TRACT | Status: DC | PRN

## 2013-05-12 MED ORDER — LABETALOL HCL 5 MG/ML IV SOLN
20.0000 mg | Freq: Once | INTRAVENOUS | Status: AC
  Administered 2013-05-12: 20 mg via INTRAVENOUS
  Filled 2013-05-12: qty 4

## 2013-05-12 NOTE — ED Notes (Signed)
Pt brought to ED by EMS with SOB and hypertension from urgent care

## 2013-05-12 NOTE — ED Provider Notes (Signed)
History     CSN: [id number]  Arrival date & time 05/12/13  1046   First MD Initiated Contact with Patient 05/12/13 1105      Chief Complaint  Patient presents with  . Shortness of Breath    (Consider location/radiation/quality/duration/timing/severity/associated sxs/prior treatment) Patient is a 51 y.o. male presenting with general illness. The history is provided by the patient and medical records. No language interpreter was used.  Illness Location:  Pt is a 52 yo man with multiple medical problems.  His blood pressure is high.  He has trouble breathing and was recently discharged from [facility] for pneumonia.  He has pain with gout.   Quality:  He had been seen in the [facility], was sent to the new [facility], who sent him back to [facility] ED.   Severity:  Moderate Onset quality:  Gradual Duration:  3 days Timing:  Constant Progression:  Unchanged Chronicity:  Recurrent Associated symptoms: cough, headaches and shortness of breath   Associated symptoms: no chest pain, no congestion, no fever and no sore throat     Past Medical History  Diagnosis Date  . Hypertension   . Gout   . Angina pectoris   . COPD (chronic obstructive pulmonary disease)     No past surgical history on file.  Family History  Problem Relation Age of Onset  . Diabetes type II Mother   . Depression Father   . Asthma Brother     History  Substance Use Topics  . Smoking status: Current Every Day Smoker -- 1.50 packs/day    Types: Cigarettes  . Smokeless tobacco: Never Used  . Alcohol Use: 17.7 oz/week    21 Drinks containing 0.5 oz of alcohol, 12 Cans of beer per week      Review of Systems  Constitutional: Negative.  Negative for fever and chills.  HENT: Negative for congestion and sore throat.   Eyes: Negative.   Respiratory: Positive for cough and shortness of breath.   Cardiovascular: Negative for chest pain and palpitations.        History of hypertension.  Gastrointestinal: Negative.   Genitourinary: Negative.   Musculoskeletal:       Joint pain from gout.  Skin: Negative.   Neurological: Positive for headaches.  Psychiatric/Behavioral: Negative.     Allergies  Review of patient's allergies indicates no known allergies.  Home Medications   Current Outpatient Rx  Name  Route  Sig  Dispense  Refill  . albuterol (PROVENTIL HFA;VENTOLIN HFA) 108 (90 BASE) MCG/ACT inhaler   Inhalation   Inhale 2 puffs into the lungs every 6 (six) hours as needed for wheezing.   2 Inhaler   5   . colchicine 0.6 MG tablet   Oral   Take 1 tablet (0.6 mg total) by mouth daily.   30 tablet   1   . doxycycline (MONODOX) 100 MG capsule   Oral   Take 1 capsule (100 mg total) by mouth 2 (two) times daily.   8 capsule   0   . ibuprofen (ADVIL,MOTRIN) 200 MG tablet   Oral   Take 400 mg by mouth every 6 (six) hours as needed for pain.         Marland Kitchen losartan (COZAAR) 50 MG tablet   Oral   Take 1 tablet (50 mg total) by mouth daily.   30 tablet   1   . predniSONE (DELTASONE) 20 MG tablet   Oral  Take 2 tablets (40 mg total) by mouth daily with breakfast.   4 tablet   0   . tiotropium (SPIRIVA) 18 MCG inhalation capsule   Inhalation   Place 1 capsule (18 mcg total) into inhaler and inhale daily.   30 capsule   12     BP 198/117  Pulse 84  Temp(Src) 97.7 F (36.5 C) (Oral)  Resp 18  SpO2 96%  Physical Exam  Nursing note and vitals reviewed. Constitutional: He is oriented to person, place, and time. He appears well-developed and well-nourished. No distress.  BP 198/117  HENT:  Head: Normocephalic and atraumatic.  Right Ear: External ear normal.  Left Ear: External ear normal.  Mouth/Throat: Oropharynx is clear and moist.  Eyes: Conjunctivae and EOM are normal. Pupils are equal, round, and reactive to light.  Neck: Normal range of motion. Neck supple.  Cardiovascular: Normal rate, regular rhythm and  normal heart sounds.   Pulmonary/Chest: Effort normal and breath sounds normal.  Abdominal: Soft. Bowel sounds are normal.  Musculoskeletal: Normal range of motion. He exhibits no edema and no tenderness.  Neurological: He is alert and oriented to person, place, and time.  No sensory or motor deficit.  Skin: Skin is warm and dry.  Psychiatric: He has a normal mood and affect. His behavior is normal.    ED Course  Procedures (including critical care time)  11:07 AM  Date: 05/12/2013  Rate:83  Rhythm: normal sinus rhythm  QRS Axis: normal  Intervals: normal QRS:  Left ventricular hypertrophy  ST/T Wave abnormalities: ST depressions inferiorly and ST depressions laterally  Conduction Disutrbances:none  Narrative Interpretation: Abnormal EKG  Old EKG Reviewed: unchanged  Results for orders placed during the hospital encounter of 05/12/13  BASIC METABOLIC PANEL      Result Value Range   Sodium 138  135 - 145 mEq/L   Potassium 3.5  3.5 - 5.1 mEq/L   Chloride 100  96 - 112 mEq/L   CO2 26  19 - 32 mEq/L   Glucose, Bld 106 (*) 70 - 99 mg/dL   BUN 11  6 - 23 mg/dL   Creatinine, Ser 1.61  0.50 - 1.35 mg/dL   Calcium 9.3  8.4 - 09.6 mg/dL   GFR calc non Af Amer >90  >90 mL/min   GFR calc Af Amer >90  >90 mL/min  CBC      Result Value Range   WBC 11.3 (*) 4.0 - 10.5 K/uL   RBC 4.56  4.22 - 5.81 MIL/uL   Hemoglobin 13.3  13.0 - 17.0 g/dL   HCT 04.5  40.9 - 81.1 %   MCV 86.4  78.0 - 100.0 fL   MCH 29.2  26.0 - 34.0 pg   MCHC 33.8  30.0 - 36.0 g/dL   RDW 91.4  78.2 - 95.6 %   Platelets 405 (*) 150 - 400 K/uL  POCT I-STAT TROPONIN I      Result Value Range   Troponin i, poc 0.01  0.00 - 0.08 ng/mL   Comment 3            Dg Chest 2 View  05/12/2013   *RADIOLOGY REPORT*  Clinical Data: Shortness of breath; chest pain  CHEST - 2 VIEW  Comparison:  May 04, 2013  Findings: There has been interval clearing of infiltrate from the right upper lobe.  Currently, the lungs are clear.   Heart is upper normal in size with normal pulmonary vascularity.  No adenopathy. No  bone lesions.  No pneumothorax.  IMPRESSION: Currently, lungs are clear.  There has been interval clearing of consolidation from the right upper lobe.   Original Report Authenticated By: Bretta Bang, M.D.   12:35 PM Pt seen --> physical exam performed.  Lab wrokup reviewed, shows WBC mildly elevated, Chest x-ray shows pneumonia has cleared.  He has hypertensive urgency.  Will Rx with IV Labetalol, ask Internal Medicine Teaching Service to readmit him to control his BP, get him on his medications.   12:47 PM MTSB will see pt.   1. Hypertensive urgency   2. Gout   3. COPD (chronic obstructive pulmonary disease)          Carleene Cooper III, MD 05/12/13 1247

## 2013-05-12 NOTE — Patient Instructions (Signed)
Go to ER for further treatment by EMS now

## 2013-05-12 NOTE — H&P (Signed)
Internal Medicine Attending Admission Note Date: 05/12/2013  Patient name: Darryl Hurley Medical record number: [id number] Date of birth: Jul 10, 1961 Age: 52 y.o. Gender: male  I saw and evaluated the patient and discussed his care with house staff; see the note by resident Dr. Lavena Bullion for details of history, clinical findings, and plans.  Chief Complaint(s): Shortness of breath; headache  History - key components related to admission: Patient is a 52 year old man with history of obstructive lung disease (asthma/COPD), hypertension, gout, and other problems as outlined in the medical history, recently hospitalized on our service with pneumonia, who was referred to the emergency department from a follow-up clinic visit with reported complaints of shortness of breath and chest pain and severely elevated blood pressure.  Patient reports that following his hospital discharge 6 days ago, he did not obtain any of his medications do to problems with transportation and problems affording the medications.  He reports stable shortness of breath aggravated by exertion.  He reports a headache today that resolved after he was treated with IV labetalol in the emergency department.  He denies chest pain.   Physical Exam - key components related to admission:  Filed Vitals:   05/12/13 1500 05/12/13 1552 05/12/13 1600 05/12/13 1636  BP:  177/101 164/103 178/109  Pulse: 83  86 86  Temp:    98.1 F (36.7 C)  TempSrc:    Oral  Resp: 25 17 19 19   SpO2: 95% 95% 95% 94%   General: Alert, oriented Lungs: Bilateral coarse rhonchi Heart: Regular; No extra sounds or murmurs Abdomen: Bowel sounds present, soft, nontender Extremities: No edema  Lab results:   Basic Metabolic Panel:  Recent Labs  57/84/69 1113  NA 138  K 3.5  CL 100  CO2 26  GLUCOSE 106*  BUN 11  CREATININE 0.99  CALCIUM 9.3    CBC:  Recent Labs  05/12/13 1113  WBC 11.3*  HGB 13.3  HCT 39.4  MCV 86.4  PLT 405*      Imaging results:  Dg Chest 2 View  05/12/2013   *RADIOLOGY REPORT*  Clinical Data: Shortness of breath; chest pain  CHEST - 2 VIEW  Comparison:  May 04, 2013  Findings: There has been interval clearing of infiltrate from the right upper lobe.  Currently, the lungs are clear.  Heart is upper normal in size with normal pulmonary vascularity.  No adenopathy. No bone lesions.  No pneumothorax.  IMPRESSION: Currently, lungs are clear.  There has been interval clearing of consolidation from the right upper lobe.   Original Report Authenticated By: Bretta Bang, M.D.     Assessment & Plan by Problem:  1.  Severe uncontrolled hypertension.  Patient had associated headache on presentation, which resolved after administration of IV labetalol in the emergency department.  He has not been taking his prescribed antihypertensive medication since his hospital discharge because he could not afford the medication.  He has chronic dyspnea but reports that it is stable.  The plan is to monitor; restart antihypertensive regimen, follow blood pressure closely, and adjust as indicated.  2.  Headache.  Likely secondary to #1.  The headache resolved after administration of IV labetalol.  Patient has no neurologic signs or symptoms.  The plan is to follow clinically.  3.  COPD.  Patient has not been using inhalers; plan is to resume inhaled bronchodilators.  4.  Recent pneumonia.  Patient received an inadequate course of treatment for his pneumonia.  He has diffuse coarse rhonchi on  exam, and the plan is to complete a course of oral antibiotics with coverage for community-acquired pneumonia.  5.  Social.  Patient reports that he is unable to afford his medications; the plan is to consult social work and case management, and tried to make sure that he does have access to needed medications when discharged.  6.  Other problems and plans as per the resident physician's note.

## 2013-05-12 NOTE — H&P (Signed)
Date: 05/12/2013               Patient Name:  Darryl Hurley MRN: [id number]  DOB: 08/11/1961 Age / Sex: 52 y.o., male   PCP: Cleora Fleet, MD         Medical Service: Internal Medicine Teaching Service         Attending Physician: Dr. Meredith Pel    First Contact: Dr. Elenor Legato Pager: [phone]  Second Contact: Dr. Suszanne Conners Pager: [phone]       After Hours (After 5p/  First Contact Pager: [phone]  weekends / holidays): Second Contact Pager: [phone]   Chief Complaint: Elevated BP  History of Present Illness:  Pt is a 52 yo man with PMH significant for COPD, HTN, gout, who was recently admitted for community acquired pneumonia. He was discharged on 05/06/2013 with instructions to complete a seven-day course of antibiotics (prescribed doxycycline at d/c). He was also discharged on home O2 which he has been using. He states that he was unable to fill his doxycycline prescription, however he states his symptoms of shortness of breath and cough have resolved. He admits to bitemporal headaches that have persisted for the entire day, however he states that this is a chronic issue which she has had intermittently for several years, and that his headaches are not any worse than they have been previously. He denies any other complaints today. He states he was attending his hospital followup appointment today when his blood pressure was found to be elevated which resulted in him being sent to the emergency department. Of note, he admits that he was not able to fill his losartan prescription or any other of his medications following his recent discharge.   Meds: No current facility-administered medications for this encounter.   No current outpatient prescriptions on file.    Allergies: Allergies as of 05/12/2013  . (No Known Allergies)   Past Medical History  Diagnosis Date  . Hypertension   . Gout   . Angina pectoris   . COPD (chronic obstructive pulmonary disease)    No past  surgical history on file. Family History  Problem Relation Age of Onset  . Diabetes type II Mother   . Depression Father   . Asthma Brother    History   Social History  . Marital Status: Single    Spouse Name: N/A    Number of Children: N/A  . Years of Education: N/A   Occupational History  . Not on file.   Social History Main Topics  . Smoking status: Current Every Day Smoker -- 1.50 packs/day    Types: Cigarettes  . Smokeless tobacco: Never Used  . Alcohol Use: 17.7 oz/week    21 Drinks containing 0.5 oz of alcohol, 12 Cans of beer per week  . Drug Use: No  . Sexually Active: Not on file   Other Topics Concern  . Not on file   Social History Narrative  . No narrative on file    Review of Systems: Review of Systems  Constitutional: Negative for fever and chills.  HENT: Negative for congestion.  Eyes: Negative.  Respiratory: Negative for cough, shortness of breath and wheezing.  Cardiovascular: Negative for chest pain and palpitations.  Gastrointestinal: Negative for nausea, vomiting and abdominal pain.  Genitourinary: Negative for dysuria and hematuria.  Musculoskeletal: Negative.  Neurological: Positive for headaches.  Endo/Heme/Allergies: Negative.  Psychiatric/Behavioral: Negative.    Physical Exam: Blood pressure 162/112, pulse 83, temperature 98.1 F (36.7 C),  temperature source Oral, resp. rate 25, SpO2 95.00%. Constitutional: He is oriented to person, place, and time and well-developed, well-nourished, and in no distress. No distress.  HENT:  Head: Normocephalic and atraumatic.  Eyes: Conjunctivae are normal. Pupils are equal, round, and reactive to light. No scleral icterus.  Neck: Normal range of motion. Neck supple. No tracheal deviation present.  Cardiovascular: Normal rate and regular rhythm.  No murmur heard.  Pulmonary/Chest: Effort normal. He has no wheezes. He has rales (R basilar rales).  Abdominal: Soft. Bowel sounds are normal. He  exhibits no distension. There is no tenderness.  Musculoskeletal: Normal range of motion. He exhibits no edema and no tenderness.  Neurological: He is alert and oriented to person, place, and time. No cranial nerve deficit.  Skin: Skin is warm and dry. He is not diaphoretic. No erythema.  Psychiatric: Affect and judgment normal.    Lab results: Basic Metabolic Panel:  Recent Labs  16/10/96 1113  NA 138  K 3.5  CL 100  CO2 26  GLUCOSE 106*  BUN 11  CREATININE 0.99  CALCIUM 9.3   CBC:  Recent Labs  05/12/13 1113  WBC 11.3*  HGB 13.3  HCT 39.4  MCV 86.4  PLT 405*   Urine Drug Screen: Drugs of Abuse     Component Value Date/Time   LABOPIA NONE DETECTED 01/22/2013 1922   COCAINSCRNUR NONE DETECTED 01/22/2013 1922   LABBENZ NONE DETECTED 01/22/2013 1922   AMPHETMU NONE DETECTED 01/22/2013 1922   THCU NONE DETECTED 01/22/2013 1922   LABBARB NONE DETECTED 01/22/2013 1922     Imaging results:  Dg Chest 2 View  05/12/2013   *RADIOLOGY REPORT*  Clinical Data: Shortness of breath; chest pain  CHEST - 2 VIEW  Comparison:  May 04, 2013  Findings: There has been interval clearing of infiltrate from the right upper lobe.  Currently, the lungs are clear.  Heart is upper normal in size with normal pulmonary vascularity.  No adenopathy. No bone lesions.  No pneumothorax.  IMPRESSION: Currently, lungs are clear.  There has been interval clearing of consolidation from the right upper lobe.   Original Report Authenticated By: Bretta Bang, M.D.    Assessment & Plan by Problem: Hypertension - BP elevated in ED to 198/117 likely secondary to medication non-compliance and walking to the ED. Improved to 168/95 on repeat measurements several hours later after receiving IV labetalol. Asymptomatic on admission, although he admits to bitemporal headaches earlier today which began after walking to his follow-up appointment. He was unable to acquire his medications as an outpatient which he states  was the primary reason for his noncompliance with losartan. - admit to floor - d/c labetalol - start lisinopril-HCTZ 10-12.5mg  daily  Hx recent CAP - CXR reveals resolution of RUL pneumonia. Pt states his SOB and cough have resolved despite being unable to fill his doxycycline prescription (could not afford levaquin). Pt received 2 days of rocephin/azithromycin and 1 day of levaquin on his previous admission.  - cont levaquin (d4/7 of total abx therapy)  COPD - SOB improved. Pt never filled his prednisone prescription, however he has no further evidence of exacerbation of his COPD symptoms and his pneumonia appears resolved.  - cont albuterol - cont spiriva  Gout - No evidence of acute gout flare.   Dispo: Disposition is deferred at this time, awaiting improvement of current medical problems. Anticipated discharge in approximately 1-2 day(s).   The patient does have a current PCP (Clanford Cyndie Mull, MD), therefore  will be require OPC follow-up after discharge.   The patient does have transportation limitations that hinder transportation to clinic appointments.  Signed: Elfredia Nevins, MD 05/12/2013, 3:13 PM

## 2013-05-12 NOTE — Progress Notes (Signed)
Patient ID: Darryl Hurley, male   DOB: June 05, 1961, 52 y.o.   MRN: [id number]  CC: hospital follow up   HPI: This gentleman was discharged from hospital by internal medicine service on 6/3 for presumed COPD exacerbation and pneumonia.  He was discharged on home oxygen.  The patient has hypertension and diabetes.  He has no medical insurance.  He has a history of angina and gout.  He is reporting that he is having an acute attack of gout in his left hand.  He is struggling to breathe.  He did not get any of his discharge medications filled.  He has not completed the full 10 day course of antibiotics.  He is reporting that he is SOB and having chest pain.  No Known Allergies Past Medical History  Diagnosis Date  . Hypertension   . Gout   . Angina pectoris   . COPD (chronic obstructive pulmonary disease)    Current Outpatient Prescriptions on File Prior to Visit  Medication Sig Dispense Refill  . albuterol (PROVENTIL HFA;VENTOLIN HFA) 108 (90 BASE) MCG/ACT inhaler Inhale 2 puffs into the lungs every 6 (six) hours as needed for wheezing.  2 Inhaler  5  . colchicine 0.6 MG tablet Take 1 tablet (0.6 mg total) by mouth daily.  30 tablet  1  . doxycycline (MONODOX) 100 MG capsule Take 1 capsule (100 mg total) by mouth 2 (two) times daily.  8 capsule  0  . ibuprofen (ADVIL,MOTRIN) 200 MG tablet Take 400 mg by mouth every 6 (six) hours as needed for pain.      Marland Kitchen losartan (COZAAR) 50 MG tablet Take 1 tablet (50 mg total) by mouth daily.  30 tablet  1  . predniSONE (DELTASONE) 20 MG tablet Take 2 tablets (40 mg total) by mouth daily with breakfast.  4 tablet  0  . tiotropium (SPIRIVA) 18 MCG inhalation capsule Place 1 capsule (18 mcg total) into inhaler and inhale daily.  30 capsule  12   No current facility-administered medications on file prior to visit.   Family History  Problem Relation Age of Onset  . Diabetes type II Mother   . Depression Father   . Asthma Brother    History   Social  History  . Marital Status: Single    Spouse Name: N/A    Number of Children: N/A  . Years of Education: N/A   Occupational History  . Not on file.   Social History Main Topics  . Smoking status: Current Every Day Smoker -- 1.50 packs/day    Types: Cigarettes  . Smokeless tobacco: Never Used  . Alcohol Use: 17.7 oz/week    21 Drinks containing 0.5 oz of alcohol, 12 Cans of beer per week  . Drug Use: No  . Sexually Active: Not on file   Other Topics Concern  . Not on file   Social History Narrative  . No narrative on file    Review of Systems  Constitutional: Negative for fever, chills, diaphoresis, activity change, appetite change and fatigue.  HENT: Negative for ear pain, nosebleeds, congestion, facial swelling, rhinorrhea, neck pain, neck stiffness and ear discharge.   Eyes: Negative for pain, discharge, redness, itching and visual disturbance.  Respiratory: Negative for cough, choking, chest tightness, shortness of breath, wheezing and stridor.   Cardiovascular: Negative for chest pain, palpitations and leg swelling.  Gastrointestinal: Negative for abdominal distention.  Genitourinary: Negative for dysuria, urgency, frequency, hematuria, flank pain, decreased urine volume, difficulty urinating and dyspareunia.  Musculoskeletal: Negative for back pain, joint swelling, arthralgias and gait problem.  Neurological: Negative for dizziness, tremors, seizures, syncope, facial asymmetry, speech difficulty, weakness, light-headedness, numbness and headaches.  Hematological: Negative for adenopathy. Does not bruise/bleed easily.  Psychiatric/Behavioral: Negative for hallucinations, behavioral problems, confusion, dysphoric mood, decreased concentration and agitation.    Objective:   Filed Vitals:   05/12/13 0938  BP: 187/124  Pulse: 94  Temp: 97.8 F (36.6 C)    Physical Exam  Constitutional: Appears ill, in mild distress.  HENT: nasal cannula oxygen.  Normocephalic.  External right and left ear normal. Oropharynx is clear and moist.  Eyes: Conjunctivae and EOM are normal. PERRLA, no scleral icterus.  Neck: Normal ROM. Neck supple. No JVD. No tracheal deviation. No thyromegaly.  CVS: RRR, S1/S2 +, no murmurs, no gallops, no carotid bruit.  Pulmonary:insp exp wheezes noted, no stridor  Abdominal: Soft. BS +,  no distension, tenderness, rebound or guarding.  Musculoskeletal: Normal range of motion. No edema and no tenderness.  Lymphadenopathy: No lymphadenopathy noted, cervical, inguinal. Neuro: Alert. Normal reflexes, muscle tone coordination. No cranial nerve deficit. Skin: Skin is warm and dry. No rash noted. Not diaphoretic. No erythema. No pallor.  Psychiatric: Normal mood and affect. Behavior, judgment, thought content normal.   Lab Results  Component Value Date   WBC 13.4* 05/05/2013   HGB 12.3* 05/05/2013   HCT 36.8* 05/05/2013   MCV 87.4 05/05/2013   PLT 338 05/05/2013   Lab Results  Component Value Date   CREATININE 0.99 05/05/2013   BUN 13 05/05/2013   NA 135 05/05/2013   K 3.6 05/05/2013   CL 96 05/05/2013   CO2 24 05/05/2013    Lab Results  Component Value Date   HGBA1C 6.7* 01/20/2013   Lipid Panel  No results found for this basename: chol, trig, hdl, cholhdl, vldl, ldlcalc      Assessment and plan:   Patient Active Problem List   Diagnosis Date Noted  . CAP (community acquired pneumonia) 05/04/2013  . Asthma 05/04/2013  . COPD exacerbation 01/20/2013  . Gout flare 01/20/2013  . TOBACCO ABUSE 04/27/2009  . Essential hypertension, benign 04/27/2009   Hypertensive Urgency - Pt has not had any medications since being discharged 1 week ago.  He is having chest pain, SOB and not properly treated for his pneumonia.  I am sending him to the ER  For appropriate treatment by EMS.  His EKG is abnormal and was reviewed.   Pt's O2 desatted to 84% when taken off so we will continue oxygen treatment.  Also the patient has an acute gout exacerbation and  untreated diabetes mellitus that will need to be addressed.  ER was called and notified and EMS was called to pick patient up for transport to ER.   Maryln Manuel, MD

## 2013-05-12 NOTE — Progress Notes (Signed)
Nursing Note  While in pt's room completing head to toe assessment, pt appeared to be very restless and fidgeting in bed. Pt also kept looking over his right shoulder and mumbling. I asked pt who he was talking to or if he saw someone - pt stated "no one's there," but continued to keep talking over his right shoulder. Per day shift report, pt was alert and oriented without any issues. MD notified who stated he would come up to evaluate the patient. Will continue to monitor. C.Minnie Legros, RN.

## 2013-05-12 NOTE — ED Notes (Signed)
Pt says he has joint pain .

## 2013-05-12 NOTE — Progress Notes (Signed)
IMTS Night Float Progress Note.  Called by RN regarding patient restlessness/fidgeting, looking over his right shoulder and mumbling.  Subjective: Patient is without acute complaints.  He reports getting pain medication for gout in his hands.  He admits to thinking outloud and sometimes feeling like he is in another world during which time he won't respond to others, especially now because he has a lot of things on his mind.  He has been told by his lady friend that he talks in his sleep.  He has family members that have died, but he does not believe that others are talking to him.  He is not any acute pain.  He was visited by his lady friend earlier, and he reports that she did not give him any medications.  He does not read/write.  He admits to beer use and a fifth of liquor a week, reports his last drink was 2 weeks ago at a party, but no other drug use.  Cocaine use one year ago, no history of heroin use.  Objective: Filed Vitals:   05/12/13 1900  BP: 169/118  Pulse: 87  Temp: 99 F (37.2 C)  Resp: 20   General: resting in bed, no acute distress Cardiac: RRR, no rubs, murmurs or gallops Pulm: clear to auscultation bilaterally, moving normal volumes of air Ext: warm and well perfused, no pedal edema, swollen, warm left wrist Neuro: alert and oriented X person, place and season (not oriented to year, reports that he usually looks at the calender - went to 11th grade), cranial nerves II-XII grossly intact  Assessment/Plan Patient is 52 yo man admitted earlier today with cough/SOB, being treated for CAP.  He is without psychiatry history but does endorse EtOH use, and denies visual and auditory hallucinations, occasionally thinks out loud and talks in sleep at baseline.    -No acute psychiatric or medical issues, patient appears comfortable and oriented -Continue ibuprofen for gout -Continue to monitor for signs of EtOH withdrawal

## 2013-05-13 ENCOUNTER — Inpatient Hospital Stay: Payer: Self-pay

## 2013-05-13 DIAGNOSIS — I10 Essential (primary) hypertension: Secondary | ICD-10-CM

## 2013-05-13 LAB — COMPREHENSIVE METABOLIC PANEL
Albumin: 2.8 g/dL — ABNORMAL LOW (ref 3.5–5.2)
Alkaline Phosphatase: 67 U/L (ref 39–117)
BUN: 12 mg/dL (ref 6–23)
CO2: 26 mEq/L (ref 19–32)
Chloride: 98 mEq/L (ref 96–112)
Creatinine, Ser: 1.09 mg/dL (ref 0.50–1.35)
GFR calc Af Amer: 90 mL/min — ABNORMAL LOW (ref >90)
GFR calc non Af Amer: 77 mL/min — ABNORMAL LOW (ref >90)
Glucose, Bld: 147 mg/dL — ABNORMAL HIGH (ref 70–99)
Potassium: 3.4 mEq/L — ABNORMAL LOW (ref 3.5–5.1)
Total Bilirubin: 0.6 mg/dL (ref 0.3–1.2)

## 2013-05-13 LAB — CBC
MCV: 87.6 fL (ref 78.0–100.0)
Platelets: 397 10*3/uL (ref 150–400)
RDW: 15.7 % — ABNORMAL HIGH (ref 11.5–15.5)
WBC: 8.8 10*3/uL (ref 4.0–10.5)

## 2013-05-13 MED ORDER — POTASSIUM CHLORIDE CRYS ER 20 MEQ PO TBCR
40.0000 meq | EXTENDED_RELEASE_TABLET | Freq: Once | ORAL | Status: AC
  Administered 2013-05-13: 40 meq via ORAL
  Filled 2013-05-13: qty 2

## 2013-05-13 MED ORDER — ALBUTEROL SULFATE HFA 108 (90 BASE) MCG/ACT IN AERS: 1.0000 | INHALATION_SPRAY | Freq: Four times a day (QID) | RESPIRATORY_TRACT | Status: DC | PRN

## 2013-05-13 MED ORDER — TIOTROPIUM BROMIDE MONOHYDRATE 18 MCG IN CAPS: 18.0000 ug | ORAL_CAPSULE | Freq: Every day | RESPIRATORY_TRACT | Status: DC

## 2013-05-13 MED ORDER — LEVOFLOXACIN 500 MG PO TABS: 500.0000 mg | ORAL_TABLET | Freq: Every day | ORAL | Status: DC

## 2013-05-13 MED ORDER — LISINOPRIL-HYDROCHLOROTHIAZIDE 10-12.5 MG PO TABS: 1.0000 | ORAL_TABLET | Freq: Every day | ORAL | Status: DC

## 2013-05-13 NOTE — Care Management Note (Signed)
   CARE MANAGEMENT NOTE 05/13/2013  Patient:  Darryl Hurley, Darryl Hurley   Account Number:  [id number]  Date Initiated:  05/13/2013  Documentation initiated by:  Johny Shock  Subjective/Objective Assessment:   6/11 Informed by charge RN that pt was unable to purchase medication at time of last d/c.     Action/Plan:   Spoke with pt last CM re pt failure to purchase medications after last d/c, even after he was informed of cost and stated that he could afford the meds. Pt has previously used the [facility] program for this year and was not eligible again.   Anticipated DC Date:     Anticipated DC Plan:  HOME/SELF CARE         Choice offered to / List presented to:             Status of service:  In process, will continue to follow Medicare Important Message given?   (If response is "NO", the following Medicare IM given date fields will be blank) Date Medicare IM given:   Date Additional Medicare IM given:    Discharge Disposition:    Per UR Regulation:    If discussed at Long Length of Stay Meetings, dates discussed:    Comments:  05/13/2013 Spoke with CM Letha Cape RN re this pt last visit and her efforts to assist this patient. Patient was not eligible for assistance as he has already used the [facility] program this year. CM had obtained cheapest prices at [employer] and pt had agree that he would purchase Doyx and Prednisone for approx $25 for the two. However pt apparently did not purchase medications and is now readmitted. CM will continue to follow. Johny Shock RN MPH Case Manager [phone]

## 2013-05-13 NOTE — Progress Notes (Signed)
Nutrition Brief Note  Patient identified on the Malnutrition Screening Tool (MST) Report. Pt reported 30 lb weight loss PTA, however per chart review, pt with minimal, non-significant wt fluctuations.  Wt Readings from Last 10 Encounters:  05/12/13 186 lb 11.7 oz (84.7 kg)  05/12/13 190 lb (86.183 kg)  05/04/13 178 lb 6.4 oz (80.922 kg)  01/20/13 186 lb 4.6 oz (84.5 kg)  12/06/11 160 lb (72.576 kg)  04/27/09 196 lb (88.905 kg)    Body mass index is 31.07 kg/(m^2). Patient meets criteria for Obese Class I based on current BMI.   Current diet order is Regular. Labs and medications reviewed.   No nutrition interventions warranted at this time. If nutrition issues arise, please consult RD.   Jarold Motto MS, RD, LDN Pager: [phone] After-hours pager: [phone]

## 2013-05-13 NOTE — Progress Notes (Signed)
UR COMPLETED  

## 2013-05-13 NOTE — Progress Notes (Signed)
Internal Medicine Attending  Date: 05/13/2013  Patient name: Darryl Hurley Medical record number: [id number] Date of birth: [birth year] Age: 52 y.o. Gender: male  I saw and evaluated the patient on AM rounds and discussed his care with resident Dr. Lavena Bullion. I reviewed the resident's note by Dr. Lavena Bullion and I agree with the resident's findings and plans as documented in his note.

## 2013-05-13 NOTE — Progress Notes (Signed)
Spoke with pt regarding rx- states he can not get to a [facility], requesting to use [facility] pharmacy. MD paged to notify.

## 2013-05-13 NOTE — Discharge Summary (Signed)
Name: Darryl Hurley MRN: [id number] DOB: 09/27/1961 52 y.o. PCP: Cleora Fleet, MD  Date of Admission: 05/12/2013 10:47 AM Date of Discharge: 05/13/2013 Attending Physician: Farley Ly, MD  Discharge Diagnosis: Hypertension Hx recent CAP COPD Gout  Discharge Medications:   Medication List    TAKE these medications       albuterol 108 (90 BASE) MCG/ACT inhaler  Commonly known as:  PROVENTIL HFA;VENTOLIN HFA  Inhale 1-2 puffs into the lungs every 6 (six) hours as needed for wheezing or shortness of breath.     levofloxacin 500 MG tablet  Commonly known as:  LEVAQUIN  Take 1 tablet (500 mg total) by mouth daily.     lisinopril-hydrochlorothiazide 10-12.5 MG per tablet  Commonly known as:  PRINZIDE,ZESTORETIC  Take 1 tablet by mouth daily.     tiotropium 18 MCG inhalation capsule  Commonly known as:  SPIRIVA  Place 1 capsule (18 mcg total) into inhaler and inhale daily.        Disposition and follow-up:   Darryl Hurley was discharged from [facility] in Good condition.  At the hospital follow up visit please address:  1.  F/u on pt's hypertension and assess BP. Ensure he is filling his anti-HTN prescriptions. Also ensure pt has filled prescription for antibiotics (as described below).   2.  Labs / imaging needed at time of follow-up: BMET  3.  Pending labs/ test needing follow-up: N/A  Follow-up Appointments:     Follow-up Information   Follow up with [facility]     On 05/14/2013. (3:45PM)    Contact information:   [address] [place] [address]       Discharge Instructions: Discharge Orders   Future Appointments Provider Department Dept Phone   05/14/2013 3:45 PM [facility] Covering Provider [facility] Joan Flores [phone]   Future Orders Complete By Expires     Diet - low sodium heart healthy  As directed     Increase activity slowly  As directed         Consultations:    Procedures Performed:  Dg Chest 2 View  05/12/2013   *RADIOLOGY REPORT*  Clinical Data: Shortness of breath; chest pain  CHEST - 2 VIEW  Comparison:  May 04, 2013  Findings: There has been interval clearing of infiltrate from the right upper lobe.  Currently, the lungs are clear.  Heart is upper normal in size with normal pulmonary vascularity.  No adenopathy. No bone lesions.  No pneumothorax.  IMPRESSION: Currently, lungs are clear.  There has been interval clearing of consolidation from the right upper lobe.   Original Report Authenticated By: Bretta Bang, M.D.   Dg Chest 2 View (if Patient Has Fever And/or Copd)  05/04/2013   *RADIOLOGY REPORT*  Clinical Data: Cough.  CHEST - 2 VIEW  Comparison: PA and lateral chest 12/29/2012 and portable chest 01/22/2013.  Findings: There is patchy airspace disease in the right upper lobe most consistent with pneumonia.  The lungs are otherwise clear. Peribronchial thickening is noted.  Heart size is normal.  No pneumothorax or pleural fluid.  IMPRESSION: Bronchitic change with patchy right upper lobe airspace opacity worrisome for pneumonia.   Original Report Authenticated By: Holley Dexter, M.D.    Admission HPI: Pt is a 52 yo man with PMH significant for COPD, HTN, gout, who was recently admitted for community acquired pneumonia. He was discharged on 05/06/2013 with instructions to complete  a seven-day course of antibiotics (prescribed doxycycline at d/c). He was also discharged on home O2 which he has been using. He states that he was unable to fill his doxycycline prescription, however he states his symptoms of shortness of breath and cough have resolved. He admits to bitemporal headaches that have persisted for the entire day, however he states that this is a chronic issue which she has had intermittently for several years, and that his headaches are not any worse than they have been previously. He denies any other complaints  today. He states he was attending his hospital followup appointment today when his blood pressure was found to be elevated which resulted in him being sent to the emergency department. Of note, he admits that he was not able to fill his losartan prescription or any other of his medications following his recent discharge.   Hospital Course by problem list: Hypertension - BP elevated to 198/117 on admission. Admits to headache prior to admission but asymptomatic throughout hospital course. He received IV labetalol x 1 dose in the ED and was started on lisinopril-HCTZ 10-12.5mg  after admission. BP began to normalize after admission and at time of discharge BP = 143/87. He was discharged on lisinopril-HCTZ 10-12.5mg  and was instructed to f/u with the [facility] the day after discharge.   Hx recent CAP - CXR reveals resolution of RUL pneumonia. Pt states his SOB and cough have resolved despite being unable to fill his doxycycline prescription (could not afford levaquin). Pt received 2 days of rocephin/azithromycin and 1 day of levaquin on his previous admission. He received 2 days of levaquin during his hospital course and was discharged with instructions to take 3 more days of levaquin totaling a 5d consecutive antibiotic course (in addition to abx received on previous admission).   COPD - SOB improved. Pt never filled his prednisone prescription, however he has no further evidence of exacerbation of his COPD symptoms and his pneumonia appears resolved. He was given an albuterol inhaler at time of discharge. Likely will not be able to afford spiriva, however this was continued at discharge as well. Combivent might be a better option and should be discussed at time of hospital f/u.   Gout - No evidence of acute gout flare. Colchicine not renewed at discharge, as pt cannot afford this medication. Management of gout going forward should be addressed at f/u.   Hypokalemia - pt mildly  hypokalemic after starting HCTZ-lisinopril (K = 3.4). Repleted with K-dur x 1 dose. Consider BMET at time of hospital f/u to reassess this issue.  Discharge Vitals:   BP 143/87  Pulse 80  Temp(Src) 98.5 F (36.9 C) (Oral)  Resp 20  Ht 5\' 5"  (1.651 m)  Wt 186 lb 11.7 oz (84.7 kg)  BMI 31.07 kg/m2  SpO2 99%  Discharge Labs:  Results for orders placed during the hospital encounter of 05/12/13 (from the past 24 hour(s))  COMPREHENSIVE METABOLIC PANEL     Status: Abnormal   Collection Time    05/13/13  5:40 AM      Result Value Range   Sodium 137  135 - 145 mEq/L   Potassium 3.4 (*) 3.5 - 5.1 mEq/L   Chloride 98  96 - 112 mEq/L   CO2 26  19 - 32 mEq/L   Glucose, Bld 147 (*) 70 - 99 mg/dL   BUN 12  6 - 23 mg/dL   Creatinine, Ser 1.61  0.50 - 1.35 mg/dL   Calcium  9.1  8.4 - 10.5 mg/dL   Total Protein 7.4  6.0 - 8.3 g/dL   Albumin 2.8 (*) 3.5 - 5.2 g/dL   AST 16  0 - 37 U/L   ALT 14  0 - 53 U/L   Alkaline Phosphatase 67  39 - 117 U/L   Total Bilirubin 0.6  0.3 - 1.2 mg/dL   GFR calc non Af Amer 77 (*) >90 mL/min   GFR calc Af Amer 90 (*) >90 mL/min  CBC     Status: Abnormal   Collection Time    05/13/13  5:40 AM      Result Value Range   WBC 8.8  4.0 - 10.5 K/uL   RBC 4.26  4.22 - 5.81 MIL/uL   Hemoglobin 12.2 (*) 13.0 - 17.0 g/dL   HCT 47.8 (*) 29.5 - 62.1 %   MCV 87.6  78.0 - 100.0 fL   MCH 28.6  26.0 - 34.0 pg   MCHC 32.7  30.0 - 36.0 g/dL   RDW 30.8 (*) 65.7 - 84.6 %   Platelets 397  150 - 400 K/uL    Signed: Elfredia Nevins, MD 05/13/2013, 2:17 PM   Time Spent on Discharge: 35 minutes Equipment Ordered on Discharge: Pt should continue home O2

## 2013-05-13 NOTE — Progress Notes (Signed)
Subjective:    Pt feels well this AM. Denies SOB or CP. Denies any further headache since admission.   Interval Events: Pt was thought to be talking to himself yesterday evening while in his room alone. IMTS night float team was called to evaluate at which time the patient denied any visual or auditory hallucinations and stated that he talks in his sleep at baseline and this is a normal occurrence for him.    Objective:    Vital Signs:   Temp:  [97.7 F (36.5 C)-99 F (37.2 C)] 97.9 F (36.6 C) (06/11 0545) Pulse Rate:  [76-91] 76 (06/11 0545) Resp:  [8-26] 22 (06/11 0545) BP: (157-198)/(95-125) 165/109 mmHg (06/11 0545) SpO2:  [93 %-100 %] 100 % (06/11 0756) Weight:  [186 lb 11.7 oz (84.7 kg)] 186 lb 11.7 oz (84.7 kg) (06/10 1727) Last BM Date: 05/12/13  24-hour weight change: Weight change:   Intake/Output:   Intake/Output Summary (Last 24 hours) at 05/13/13 1002 Last data filed at 05/13/13 0700  Gross per 24 hour  Intake    340 ml  Output    950 ml  Net   -610 ml      Physical Exam: General: Vital signs reviewed and noted. Well-developed, well-nourished, in no acute distress; alert, appropriate and cooperative throughout examination.  Lungs:  Normal respiratory effort. Rales in R base improved since admission.  Heart: RRR. S1 and S2 normal without gallop, murmur, or rubs.  Abdomen:  BS normoactive. Soft, Nondistended, non-tender.  No masses or organomegaly.  Extremities: No pretibial edema.     Labs:  Basic Metabolic Panel:  Recent Labs Lab 05/12/13 1113 05/13/13 0540  NA 138 137  K 3.5 3.4*  CL 100 98  CO2 26 26  GLUCOSE 106* 147*  BUN 11 12  CREATININE 0.99 1.09  CALCIUM 9.3 9.1    Liver Function Tests:  Recent Labs Lab 05/13/13 0540  AST 16  ALT 14  ALKPHOS 67  BILITOT 0.6  PROT 7.4  ALBUMIN 2.8*    CBC:  Recent Labs Lab 05/12/13 1113 05/13/13 0540  WBC 11.3* 8.8  HGB 13.3 12.2*  HCT 39.4 37.3*  MCV 86.4 87.6  PLT 405* 397     Microbiology: Results for orders placed during the hospital encounter of 05/04/13  CULTURE, EXPECTORATED SPUTUM-ASSESSMENT     Status: None   Collection Time    05/04/13  4:11 PM      Result Value Range Status   Specimen Description SPUTUM   Final   Special Requests NONE   Final   Sputum evaluation     Final   Value: THIS SPECIMEN IS ACCEPTABLE. RESPIRATORY CULTURE REPORT TO FOLLOW.   Report Status 05/04/2013 FINAL   Final  CULTURE, RESPIRATORY (NON-EXPECTORATED)     Status: None   Collection Time    05/04/13  4:11 PM      Result Value Range Status   Specimen Description SPUTUM   Final   Special Requests NONE   Final   Gram Stain     Final   Value: RARE WBC PRESENT, PREDOMINANTLY PMN     RARE SQUAMOUS EPITHELIAL CELLS PRESENT     RARE GRAM NEGATIVE RODS     RARE GRAM POSITIVE COCCI     IN PAIRS RARE GRAM POSITIVE RODS   Culture NORMAL OROPHARYNGEAL FLORA   Final   Report Status 05/07/2013 FINAL   Final  CULTURE, BLOOD (ROUTINE X 2)     Status: None  Collection Time    05/04/13  5:50 PM      Result Value Range Status   Specimen Description BLOOD RIGHT ARM   Final   Special Requests BOTTLES DRAWN AEROBIC AND ANAEROBIC 10CC   Final   Culture  Setup Time 05/05/2013 01:03   Final   Culture NO GROWTH 5 DAYS   Final   Report Status 05/11/2013 FINAL   Final  CULTURE, BLOOD (ROUTINE X 2)     Status: None   Collection Time    05/04/13  6:15 PM      Result Value Range Status   Specimen Description BLOOD RIGHT ARM   Final   Special Requests BOTTLES DRAWN AEROBIC AND ANAEROBIC 10CC   Final   Culture  Setup Time 05/05/2013 01:03   Final   Culture NO GROWTH 5 DAYS   Final   Report Status 05/11/2013 FINAL   Final    Imaging: Dg Chest 2 View  05/12/2013   *RADIOLOGY REPORT*  Clinical Data: Shortness of breath; chest pain  CHEST - 2 VIEW  Comparison:  May 04, 2013  Findings: There has been interval clearing of infiltrate from the right upper lobe.  Currently, the lungs are clear.   Heart is upper normal in size with normal pulmonary vascularity.  No adenopathy. No bone lesions.  No pneumothorax.  IMPRESSION: Currently, lungs are clear.  There has been interval clearing of consolidation from the right upper lobe.   Original Report Authenticated By: Bretta Bang, M.D.       Medications:    Infusions:    Scheduled Medications: . enoxaparin (LOVENOX) injection  40 mg Subcutaneous Q24H  . hydrochlorothiazide  12.5 mg Oral Daily  . levofloxacin  750 mg Oral Daily  . lisinopril  10 mg Oral Daily  . potassium chloride  40 mEq Oral Once  . tiotropium  18 mcg Inhalation Daily    PRN Medications: albuterol, ibuprofen   Assessment/ Plan:   Hypertension - BP stable this AM at ~160/110. He received lisinopril 10mg  and HCTZ 12.5mg  yesterday evening. Asymptomatic. He will need close follow-up at discharge.  - lisinopril-HCTZ 10-12.5mg   - f/u with   Hx recent CAP - CXR reveals resolution of RUL pneumonia. Pt states his SOB and cough have resolved despite being unable to fill his doxycycline prescription (could not afford levaquin). Pt received 2 days of rocephin/azithromycin and 1 day of levaquin on his previous admission.  - cont levaquin (d5/7 of total abx therapy)   COPD - SOB improved. Pt never filled his prednisone prescription, however he has no further evidence of exacerbation of his COPD symptoms and his pneumonia appears resolved.  - cont albuterol  - cont spiriva   Gout - No evidence of acute gout flare.     DVT PPX - lovenox  CODE STATUS - full  CONSULTS PLACED - N/A  DISPO - Likely discharge home today with close follow-up at the [facility].   The patient does have a current PCP (Clanford Laural Benes, MD) and does not need an [facility] hospital follow-up appointment after discharge.    Is the [facility] hospital follow-up appointment a one-time only appointment? not applicable.  Does the patient have transportation limitations that  hinder transportation to clinic appointments? no   SERVICE NEEDED AT DISCHARGE - TO BE DETERMINED DURING HOSPITAL COURSE         Y = Yes, Blank = No PT:   OT:   RN:   Equipment:  Other:      Length of Stay: 1 day(s)   Signed: Elfredia Nevins, MD  PGY-1, Internal Medicine Resident Pager: [phone] (7AM-5PM) 05/13/2013, 10:02 AM

## 2013-05-13 NOTE — H&P (Signed)
Internal Medicine Attending  Date: 05/13/2013  Patient name: Darryl Hurley Medical record number: [id number] Date of birth: 03-28-1961 Age: 52 y.o. Gender: male  I saw and evaluated the patient. I reviewed the resident's note by Dr. Lavena Bullion and I agree with the resident's findings and plans as documented in his note; see attending H&P for additional comments.

## 2013-05-13 NOTE — Progress Notes (Signed)
Pt signed d/c papers. Instructions given on importance of getting medications and compliance. Inhalers used during admission given to patient. IV d/c'd. Room checked for belongings. Escorted via wheelchair and NT.

## 2013-05-14 ENCOUNTER — Ambulatory Visit: Payer: Medicaid Other | Attending: Family Medicine | Admitting: Family Medicine

## 2013-05-14 ENCOUNTER — Encounter: Payer: Self-pay | Admitting: Family Medicine

## 2013-05-14 VITALS — BP 145/86 | HR 89 | Temp 98.1°F | Resp 20 | Ht 66.14 in | Wt 188.1 lb

## 2013-05-14 DIAGNOSIS — E119 Type 2 diabetes mellitus without complications: Secondary | ICD-10-CM

## 2013-05-14 DIAGNOSIS — E1165 Type 2 diabetes mellitus with hyperglycemia: Secondary | ICD-10-CM | POA: Insufficient documentation

## 2013-05-14 DIAGNOSIS — E1142 Type 2 diabetes mellitus with diabetic polyneuropathy: Secondary | ICD-10-CM | POA: Insufficient documentation

## 2013-05-14 DIAGNOSIS — R739 Hyperglycemia, unspecified: Secondary | ICD-10-CM

## 2013-05-14 DIAGNOSIS — Z595 Extreme poverty: Secondary | ICD-10-CM

## 2013-05-14 DIAGNOSIS — R7309 Other abnormal glucose: Secondary | ICD-10-CM

## 2013-05-14 DIAGNOSIS — Z598 Other problems related to housing and economic circumstances: Secondary | ICD-10-CM

## 2013-05-14 DIAGNOSIS — Z09 Encounter for follow-up examination after completed treatment for conditions other than malignant neoplasm: Secondary | ICD-10-CM | POA: Insufficient documentation

## 2013-05-14 NOTE — Patient Instructions (Addendum)

## 2013-05-14 NOTE — Progress Notes (Signed)
Patient ID: Darryl Hurley, male   DOB: 1961-08-15, 52 y.o.   MRN: [id number]  CC: Hospital followup  HPI: The patient was discharged from the hospital yesterday and is presenting today to followup.  He reports that he was able to get the prescription filled for the lisinopril today.  He reports that he has not been able to get the other medications filled.  He reports that he feels better.  He is not having headaches or shortness of breath or chest pain.  He denies syncope and significant fatigue.  He is trying to get a [facility] card established.  He plans on doing that in the next several days.  No Known Allergies Past Medical History  Diagnosis Date  . Hypertension   . Gout   . Angina pectoris   . COPD (chronic obstructive pulmonary disease)   . Asthma   . Exertional shortness of breath   . Arthritis   . Chronic lower back pain   . Pneumonia     "recently had walking pneumonia" (05/12/2013)  . On home oxygen therapy     "2L 24/7" (05/12/2013)   Current Outpatient Prescriptions on File Prior to Visit  Medication Sig Dispense Refill  . albuterol (PROVENTIL HFA;VENTOLIN HFA) 108 (90 BASE) MCG/ACT inhaler Inhale 1-2 puffs into the lungs every 6 (six) hours as needed for wheezing or shortness of breath.  2 Inhaler  5  . levofloxacin (LEVAQUIN) 500 MG tablet Take 1 tablet (500 mg total) by mouth daily.  3 tablet  0  . lisinopril-hydrochlorothiazide (PRINZIDE,ZESTORETIC) 10-12.5 MG per tablet Take 1 tablet by mouth daily.  30 tablet  1  . tiotropium (SPIRIVA) 18 MCG inhalation capsule Place 1 capsule (18 mcg total) into inhaler and inhale daily.  30 capsule  12   No current facility-administered medications on file prior to visit.   Family History  Problem Relation Age of Onset  . Diabetes type II Mother   . Depression Father   . Asthma Brother    History   Social History  . Marital Status: Single    Spouse Name: N/A    Number of Children: N/A  . Years of Education: N/A    Occupational History  . Not on file.   Social History Main Topics  . Smoking status: Current Every Day Smoker -- 2.00 packs/day for 40 years    Types: Cigarettes  . Smokeless tobacco: Never Used     Comment: 05/12/2013 'eased off smoking since last month"  . Alcohol Use: 42.0 oz/week    70 Cans of beer per week     Comment: 05/12/2013 "usually drink 3, 40oz/day"  . Drug Use: Yes    Special: "Crack" cocaine     Comment: 05/12/2013 "quit smoking crack long time ago"  . Sexually Active: No   Other Topics Concern  . Not on file   Social History Narrative  . No narrative on file    Review of Systems  Constitutional: Negative for fever, chills, diaphoresis, activity change, appetite change and fatigue.  HENT: Negative for ear pain, nosebleeds, congestion, facial swelling, rhinorrhea, neck pain, neck stiffness and ear discharge.   Eyes: Negative for pain, discharge, redness, itching and visual disturbance.  Respiratory: Negative for cough, choking, chest tightness, shortness of breath, wheezing and stridor.   Cardiovascular: Negative for chest pain, palpitations and leg swelling.  Gastrointestinal: Negative for abdominal distention.  Genitourinary: Negative for dysuria, urgency, frequency, hematuria, flank pain, decreased urine volume, difficulty urinating and dyspareunia.  Musculoskeletal: Negative for back pain, joint swelling, arthralgias and gait problem.  Neurological: Negative for dizziness, tremors, seizures, syncope, facial asymmetry, speech difficulty, weakness, light-headedness, numbness and headaches.  Hematological: Negative for adenopathy. Does not bruise/bleed easily.  Psychiatric/Behavioral: Negative for hallucinations, behavioral problems, confusion, dysphoric mood, decreased concentration and agitation.    Objective:   Filed Vitals:   05/14/13 1526  BP: 145/86  Pulse: 89  Temp: 98.1 F (36.7 C)  Resp: 20   Physical Exam  Constitutional: Appears  well-developed and well-nourished. No distress.  HENT: Normocephalic. External right and left ear normal. Oropharynx is clear and moist.  Eyes: Conjunctivae and EOM are normal. PERRLA, no scleral icterus.  Neck: Normal ROM. Neck supple. No JVD. No tracheal deviation. No thyromegaly.  CVS: RRR, S1/S2 +, no murmurs, no gallops, no carotid bruit.  Pulmonary: Effort and breath sounds normal, no stridor, rhonchi, wheezes, rales.  Abdominal: Soft. BS +,  no distension, tenderness, rebound or guarding.  Musculoskeletal: Normal range of motion. No edema and no tenderness.  Lymphadenopathy: No lymphadenopathy noted, cervical, inguinal. Neuro: Alert. Normal reflexes, muscle tone coordination. No cranial nerve deficit. Skin: Skin is warm and dry. No rash noted. Not diaphoretic. No erythema. No pallor.  Psychiatric: Normal mood and affect. Behavior, judgment, thought content normal.   Lab Results  Component Value Date   WBC 8.8 05/13/2013   HGB 12.2* 05/13/2013   HCT 37.3* 05/13/2013   MCV 87.6 05/13/2013   PLT 397 05/13/2013   Lab Results  Component Value Date   CREATININE 1.09 05/13/2013   BUN 12 05/13/2013   NA 137 05/13/2013   K 3.4* 05/13/2013   CL 98 05/13/2013   CO2 26 05/13/2013   Lab Results  Component Value Date   HGBA1C 6.7* 01/20/2013   Lipid Panel  No results found for this basename: chol, trig, hdl, cholhdl, vldl, ldlcalc     Assessment and plan:   Patient Active Problem List   Diagnosis Date Noted  . Chronic obstructive pulmonary disease (COPD) 05/12/2013  . Gout 05/12/2013  . Headache 05/12/2013  . CAP (community acquired pneumonia) 05/04/2013  . Asthma 05/04/2013  . TOBACCO ABUSE 04/27/2009  . Severe uncontrolled hypertension 04/27/2009   Patient is presenting to followup from his hospitalization.  He is doing much better.  He is still on oxygen at this time.  I would like for him to return to clinic next week to have an ambulatory pulse ox done and he can be taken off the  oxygen if appropriate.  I am going to have the nurse call him and schedule him next week to be tested and then taken off the oxygen.    His blood pressures are much better controlled now that he is able to take his blood pressure medication.  I strongly encouraged him to be sure I get the [facility] card so that he can get assistance with his medications through the MAP program.  He said that he would do so tomorrow.  I also made arrangements for him to meet with the eligibility benefits coordinator today.  Strongly encouraged tobacco cessation and counseled with him extensively today.  The patient verbalized understanding.  Offered medical assistance which the patient declined.  Medical followup in 3 weeks  The patient was given clear instructions to go to ER or return to medical center if symptoms don't improve, worsen or new problems develop.  The patient verbalized understanding.  The patient was told to call to get lab results if they  haven't heard anything in the next week.    Rodney Langton, MD, CDE, FAAFP [facility] [place], [place]

## 2013-05-14 NOTE — Progress Notes (Signed)
Pt is here for a hosp f/u for HTN Denies: SOB, CP, HA, blurry vision, edema Taking meds Rx at hosp and tolerating well The only problem he has is gout of right wrist He is alert and oriented w/no signs of acute distress.

## 2013-06-03 ENCOUNTER — Ambulatory Visit: Payer: Self-pay

## 2013-06-21 ENCOUNTER — Emergency Department (HOSPITAL_COMMUNITY)
Admission: EM | Admit: 2013-06-21 | Discharge: 2013-06-21 | Disposition: A | Discharge status: Home / Self Care | Payer: Medicaid Other | Attending: Emergency Medicine | Admitting: Emergency Medicine

## 2013-06-21 ENCOUNTER — Encounter (HOSPITAL_COMMUNITY): Payer: Self-pay | Admitting: Emergency Medicine

## 2013-06-21 ENCOUNTER — Emergency Department (HOSPITAL_COMMUNITY): Payer: Medicaid Other

## 2013-06-21 DIAGNOSIS — Z79899 Other long term (current) drug therapy: Secondary | ICD-10-CM | POA: Insufficient documentation

## 2013-06-21 DIAGNOSIS — I10 Essential (primary) hypertension: Secondary | ICD-10-CM | POA: Insufficient documentation

## 2013-06-21 DIAGNOSIS — Z9981 Dependence on supplemental oxygen: Secondary | ICD-10-CM | POA: Insufficient documentation

## 2013-06-21 DIAGNOSIS — Z8709 Personal history of other diseases of the respiratory system: Secondary | ICD-10-CM | POA: Insufficient documentation

## 2013-06-21 DIAGNOSIS — F17200 Nicotine dependence, unspecified, uncomplicated: Secondary | ICD-10-CM | POA: Insufficient documentation

## 2013-06-21 DIAGNOSIS — Z8701 Personal history of pneumonia (recurrent): Secondary | ICD-10-CM | POA: Insufficient documentation

## 2013-06-21 DIAGNOSIS — Z8679 Personal history of other diseases of the circulatory system: Secondary | ICD-10-CM | POA: Insufficient documentation

## 2013-06-21 DIAGNOSIS — G8929 Other chronic pain: Secondary | ICD-10-CM | POA: Insufficient documentation

## 2013-06-21 DIAGNOSIS — M25569 Pain in unspecified knee: Secondary | ICD-10-CM | POA: Insufficient documentation

## 2013-06-21 DIAGNOSIS — J449 Chronic obstructive pulmonary disease, unspecified: Secondary | ICD-10-CM | POA: Insufficient documentation

## 2013-06-21 DIAGNOSIS — M109 Gout, unspecified: Secondary | ICD-10-CM | POA: Insufficient documentation

## 2013-06-21 DIAGNOSIS — M549 Dorsalgia, unspecified: Secondary | ICD-10-CM | POA: Insufficient documentation

## 2013-06-21 DIAGNOSIS — M25579 Pain in unspecified ankle and joints of unspecified foot: Secondary | ICD-10-CM | POA: Insufficient documentation

## 2013-06-21 DIAGNOSIS — R609 Edema, unspecified: Secondary | ICD-10-CM | POA: Insufficient documentation

## 2013-06-21 DIAGNOSIS — J4489 Other specified chronic obstructive pulmonary disease: Secondary | ICD-10-CM | POA: Insufficient documentation

## 2013-06-21 DIAGNOSIS — Z8739 Personal history of other diseases of the musculoskeletal system and connective tissue: Secondary | ICD-10-CM | POA: Insufficient documentation

## 2013-06-21 LAB — CBC WITH DIFFERENTIAL/PLATELET
Basophils Absolute: 0 10*3/uL (ref 0.0–0.1)
Basophils Relative: 0 % (ref 0–1)
Hemoglobin: 12.6 g/dL — ABNORMAL LOW (ref 13.0–17.0)
Lymphocytes Relative: 15 % (ref 12–46)
MCHC: 32.9 g/dL (ref 30.0–36.0)
Monocytes Relative: 8 % (ref 3–12)
Neutro Abs: 5.9 10*3/uL (ref 1.7–7.7)
Neutrophils Relative %: 77 % (ref 43–77)
WBC: 7.6 10*3/uL (ref 4.0–10.5)

## 2013-06-21 LAB — BASIC METABOLIC PANEL
BUN: 6 mg/dL (ref 6–23)
Chloride: 99 mEq/L (ref 96–112)
GFR calc Af Amer: >90 mL/min (ref >90)
Potassium: 3.6 mEq/L (ref 3.5–5.1)

## 2013-06-21 LAB — URIC ACID: Uric Acid, Serum: 9.3 mg/dL — ABNORMAL HIGH (ref 4.0–7.8)

## 2013-06-21 IMAGING — CR DG KNEE COMPLETE 4+V*L*
4 series · 4 of 4 positions shown · non-contrast
Comparison: [DATE]

CLINICAL DATA: Left knee pain, history of gout

LEFT KNEE - COMPLETE 4+ VIEW

[t knee lat left]
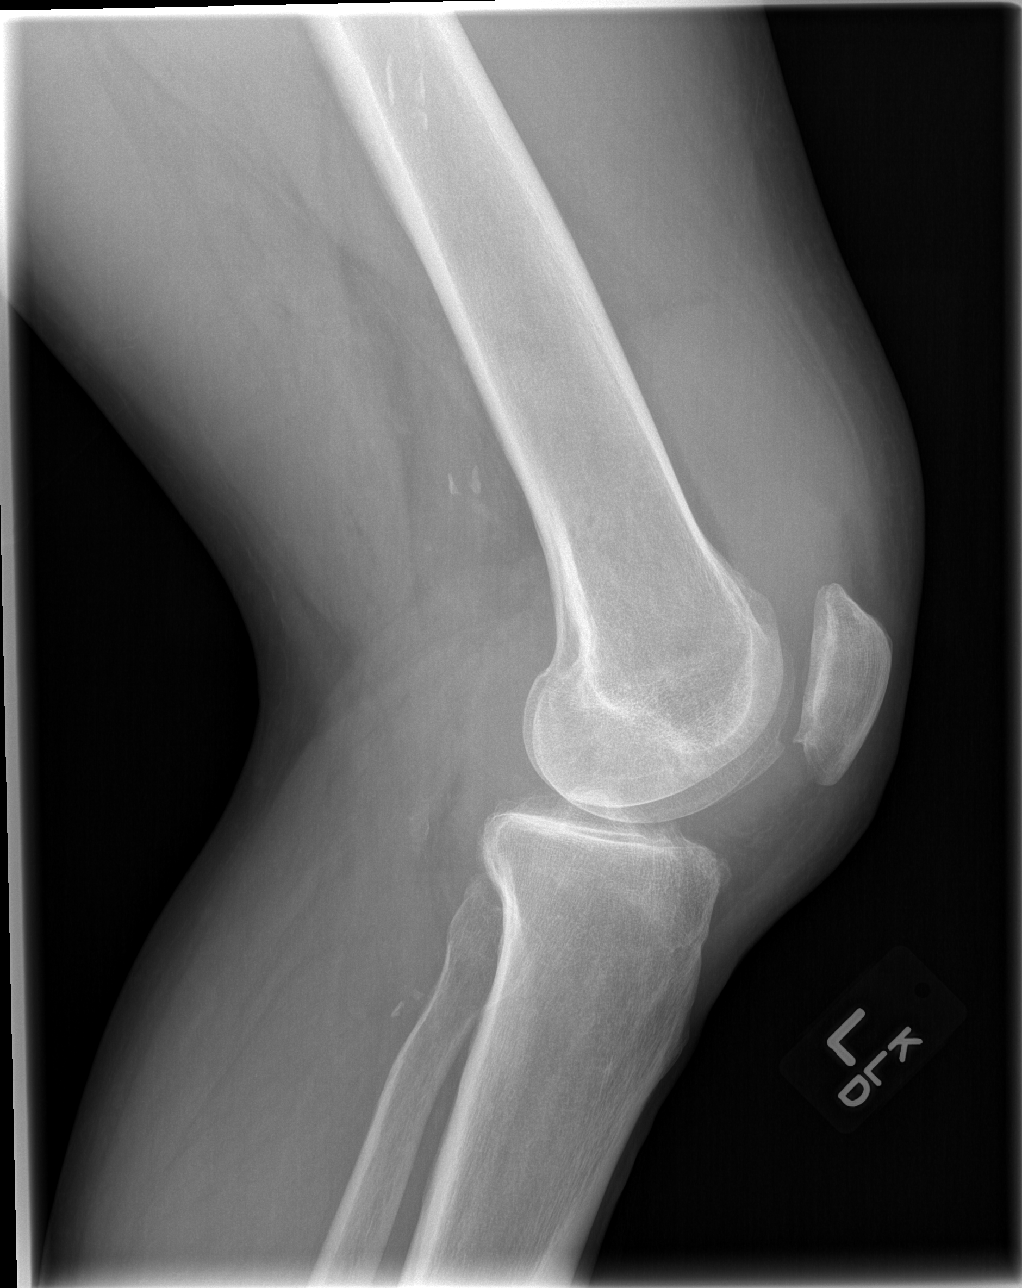

[t knee ap left]
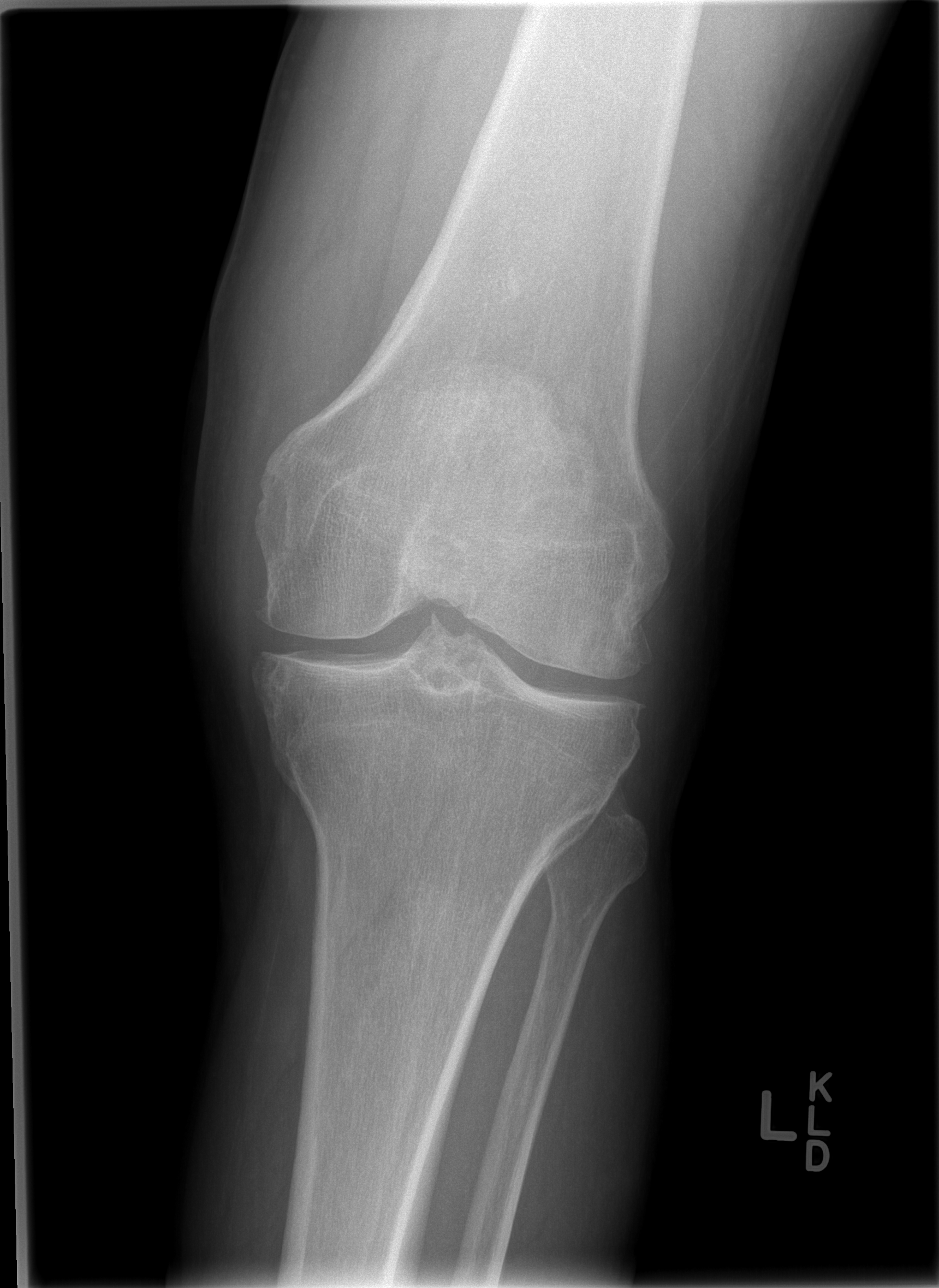

[t knee oblique left (1 of 2)]
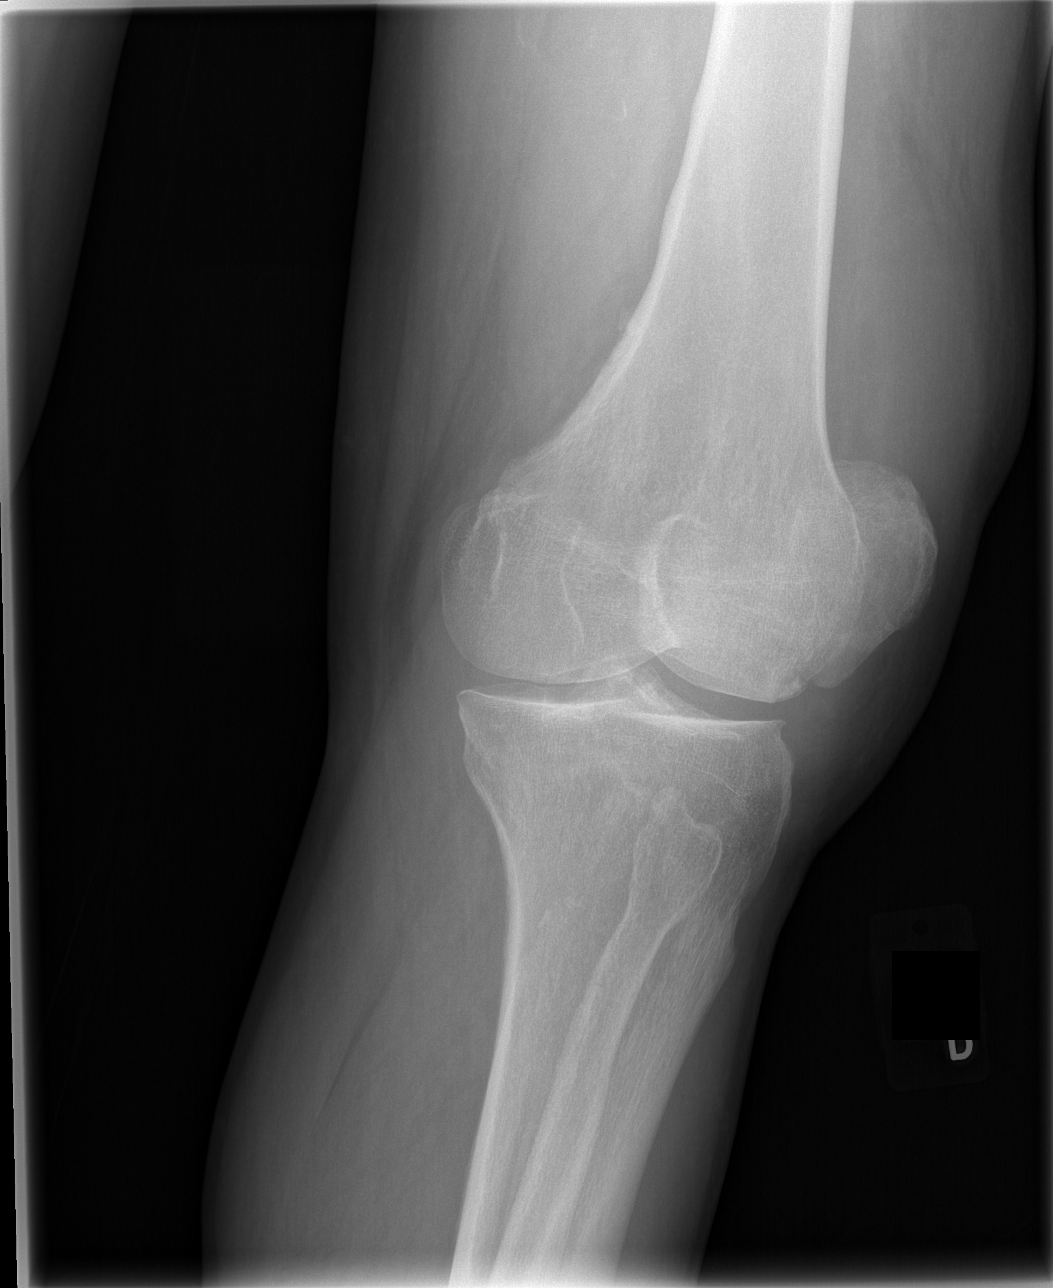

[t knee oblique left (2 of 2)]
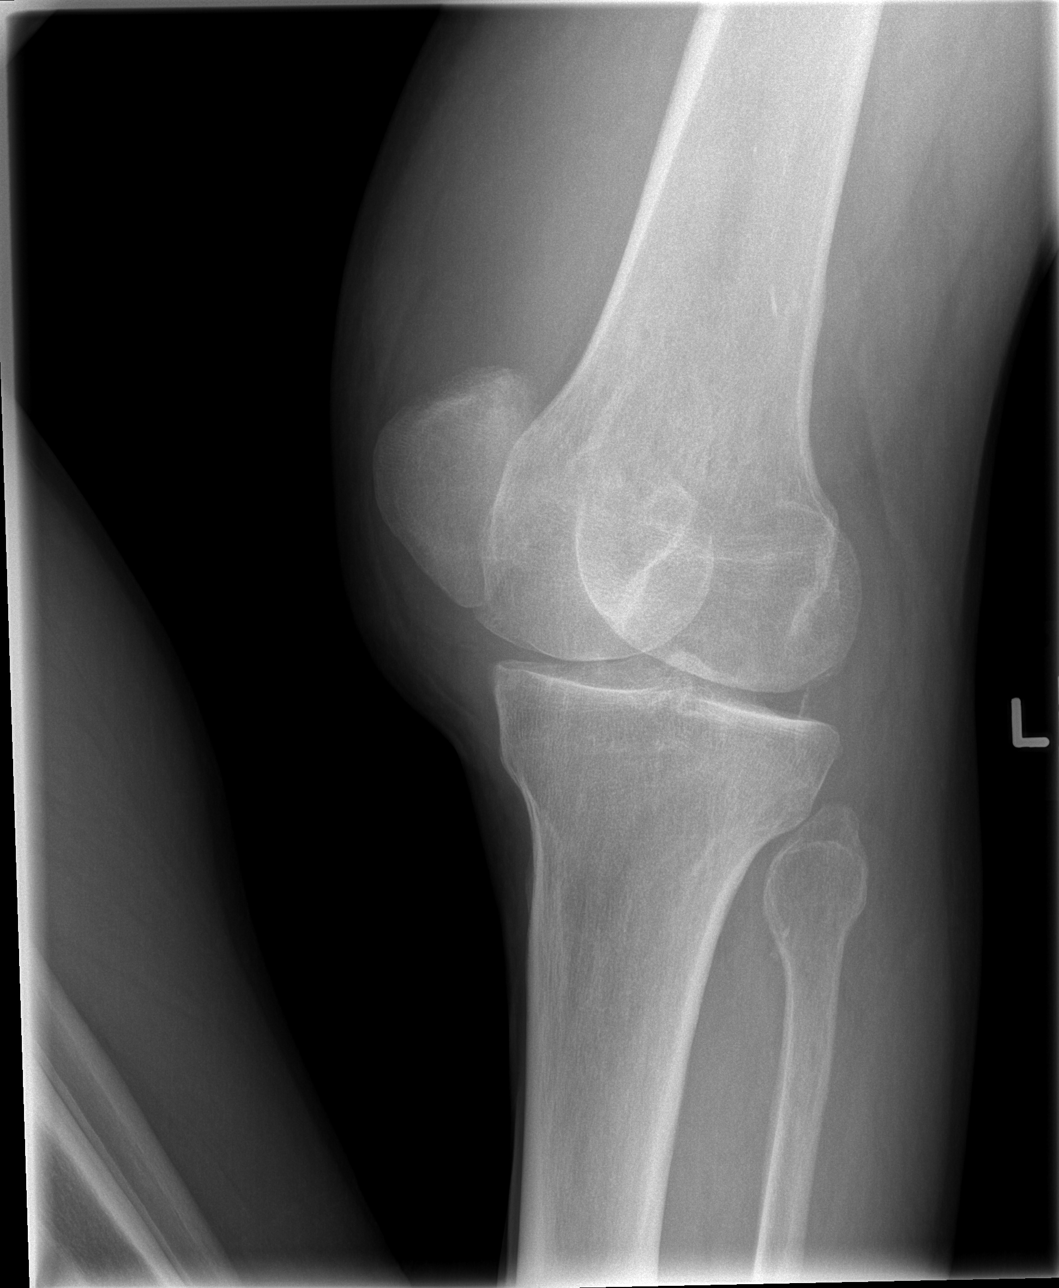

[4 of 4 positions shown; findings below may reference images not displayed]

FINDINGS: No fracture or dislocation is seen.

Mild tricompartmental degenerative changes.

Moderate suprapatellar knee joint effusion, unchanged.
IMPRESSION: Mild tricompartmental degenerative changes.

Moderate suprapatellar knee joint effusion, unchanged.

## 2013-06-21 MED ORDER — MORPHINE SULFATE 4 MG/ML IJ SOLN
4.0000 mg | Freq: Once | INTRAMUSCULAR | Status: AC
  Administered 2013-06-21: 4 mg via INTRAVENOUS
  Filled 2013-06-21: qty 1

## 2013-06-21 MED ORDER — LISINOPRIL-HYDROCHLOROTHIAZIDE 10-12.5 MG PO TABS: 1.0000 | ORAL_TABLET | Freq: Every day | ORAL | Status: DC

## 2013-06-21 MED ORDER — NAPROXEN 500 MG PO TABS: 500.0000 mg | ORAL_TABLET | Freq: Two times a day (BID) | ORAL | Status: DC

## 2013-06-21 MED ORDER — HYDROCHLOROTHIAZIDE 12.5 MG PO CAPS
12.5000 mg | ORAL_CAPSULE | Freq: Every day | ORAL | Status: DC
  Administered 2013-06-21: 12.5 mg via ORAL
  Filled 2013-06-21 (×2): qty 1

## 2013-06-21 MED ORDER — KETOROLAC TROMETHAMINE 30 MG/ML IJ SOLN
30.0000 mg | Freq: Once | INTRAMUSCULAR | Status: AC
  Administered 2013-06-21: 30 mg via INTRAVENOUS
  Filled 2013-06-21: qty 1

## 2013-06-21 MED ORDER — COLCHICINE 0.6 MG PO TABS: 0.6000 mg | ORAL_TABLET | Freq: Two times a day (BID) | ORAL | Status: DC

## 2013-06-21 MED ORDER — LISINOPRIL 10 MG PO TABS
10.0000 mg | ORAL_TABLET | Freq: Once | ORAL | Status: AC
  Administered 2013-06-21: 10 mg via ORAL
  Filled 2013-06-21: qty 1

## 2013-06-21 MED ORDER — COLCHICINE 0.6 MG PO TABS
1.2000 mg | ORAL_TABLET | ORAL | Status: AC
  Administered 2013-06-21: 1.2 mg via ORAL
  Filled 2013-06-21: qty 2

## 2013-06-21 NOTE — ED Notes (Signed)
Patient brought in via [facility] with c/o pain in the  Right hand, Left knee and back pain covering entire back. No trauma noted rates pain 10/10. NAD noted at this time

## 2013-06-21 NOTE — ED Notes (Addendum)
Patient transported to X-ray 

## 2013-06-21 NOTE — ED Provider Notes (Signed)
History    CSN: [id number] Arrival date & time 06/21/13  4098  First MD Initiated Contact with Patient 06/21/13 [phone]     Chief Complaint  Patient presents with  . Hand Pain   (Consider location/radiation/quality/duration/timing/severity/associated sxs/prior Treatment) HPI Comments: Patient is a 52 year old male with a past medical history of gout, COPD, and hypertension who presents with a 2 day history of right hand, left knee and ankle and generalized back pain. Symptoms started gradually and progressively worsened since the onset. The pain is aching and severe without radiation. Patient reports associated warmth of the joint and swelling. Patient reports this feels like his gout. He did not try anything for symptoms. Palpation and movement of the joint make the pain worse. Nothing makes the pain better. Patient denies any injury.   Past Medical History  Diagnosis Date  . Hypertension   . Gout   . Angina pectoris   . COPD (chronic obstructive pulmonary disease)   . Asthma   . Exertional shortness of breath   . Arthritis   . Chronic lower back pain   . Pneumonia     "recently had walking pneumonia" (05/12/2013)  . On home oxygen therapy     "2L 24/7" (05/12/2013)   Past Surgical History  Procedure Laterality Date  . No past surgeries     Family History  Problem Relation Age of Onset  . Diabetes type II Mother   . Depression Father   . Asthma Brother    History  Substance Use Topics  . Smoking status: Current Every Day Smoker -- 2.00 packs/day for 40 years    Types: Cigarettes  . Smokeless tobacco: Never Used     Comment: 05/12/2013 'eased off smoking since last month"  . Alcohol Use: 42.0 oz/week    70 Cans of beer per week     Comment: 05/12/2013 "usually drink 3, 40oz/day"    Review of Systems  Musculoskeletal: Positive for back pain, joint swelling and arthralgias.  All other systems reviewed and are negative.    Allergies  Review of patient's allergies  indicates no known allergies.  Home Medications   Current Outpatient Rx  Name  Route  Sig  Dispense  Refill  . albuterol (PROVENTIL HFA;VENTOLIN HFA) 108 (90 BASE) MCG/ACT inhaler   Inhalation   Inhale 1-2 puffs into the lungs every 6 (six) hours as needed for wheezing or shortness of breath.   2 Inhaler   5   . levofloxacin (LEVAQUIN) 500 MG tablet   Oral   Take 1 tablet (500 mg total) by mouth daily.   3 tablet   0   . lisinopril-hydrochlorothiazide (PRINZIDE,ZESTORETIC) 10-12.5 MG per tablet   Oral   Take 1 tablet by mouth daily.   30 tablet   1   . tiotropium (SPIRIVA) 18 MCG inhalation capsule   Inhalation   Place 1 capsule (18 mcg total) into inhaler and inhale daily.   30 capsule   12    BP 172/107  Pulse 94  Temp(Src) 98.4 F (36.9 C) (Oral)  Resp 22  SpO2 96% Physical Exam  Nursing note and vitals reviewed. Constitutional: He is oriented to person, place, and time. He appears well-developed and well-nourished. No distress.  HENT:  Head: Normocephalic and atraumatic.  Eyes: Conjunctivae and EOM are normal. Pupils are equal, round, and reactive to light.  Neck: Normal range of motion.  Cardiovascular: Normal rate and regular rhythm.  Exam reveals no gallop and no friction  rub.   No murmur heard. Pulmonary/Chest: Effort normal and breath sounds normal. He has no wheezes. He has no rales. He exhibits no tenderness.  Abdominal: Soft. He exhibits no distension. There is no tenderness. There is no rebound and no guarding.  Musculoskeletal:  Left knee and left ankle generalized edema and tenderness to mild palpation. Both joints are warm to touch. ROM limited due to pain and edema. No obvious deformity. Right wrist tender to palpation, no edema noted, and ROM limited due to pain.   Neurological: He is alert and oriented to person, place, and time. Coordination normal.  Speech is goal-oriented. Moves limbs without ataxia.   Skin: Skin is warm and dry.   Psychiatric: He has a normal mood and affect. His behavior is normal.    ED Course  Procedures (including critical care time) Labs Reviewed  CBC WITH DIFFERENTIAL - Abnormal; Notable for the following:    Hemoglobin 12.6 (*)    HCT 38.3 (*)    RDW 16.0 (*)    All other components within normal limits  BASIC METABOLIC PANEL - Abnormal; Notable for the following:    Glucose, Bld 143 (*)    GFR calc non Af Amer 79 (*)    All other components within normal limits  URIC ACID - Abnormal; Notable for the following:    Uric Acid, Serum 9.3 (*)    All other components within normal limits   Dg Knee Complete 4 Views Left  06/21/2013   *RADIOLOGY REPORT*  Clinical Data: Left knee pain, history of gout  LEFT KNEE - COMPLETE 4+ VIEW  Comparison: 01/20/2013  Findings: No fracture or dislocation is seen.  Mild tricompartmental degenerative changes.  Moderate suprapatellar knee joint effusion, unchanged.  IMPRESSION: Mild tricompartmental degenerative changes.  Moderate suprapatellar knee joint effusion, unchanged.   Original Report Authenticated By: Charline Bills, M.D.   1. Gout flare     MDM  8:05 AM Labs and xray pending. Vitals stable and patient afebrile. Patient will have toradol for pain.  10:18 AM Labs show elevated uric acid levels. Knee xray shows no acute changes. Patient will have colchicine here and morphine for pain. Patient will be discharged with Naprosyn, colchicine, and blood pressure medications. Vitals stable and patient afebrile. Patient instructed to follow up with PCP. Patient instructed to return to the ED with worsening or concerning symptoms.    Emilia Beck, PA-C 06/21/13 1022

## 2013-06-21 NOTE — ED Notes (Signed)
Right hand slightly edematous, Left knee very edematous and warm to touch.

## 2013-06-21 NOTE — ED Notes (Signed)
Patient advised that he continues to have pain although it has improved now a 7/10. NAD noted at this time

## 2013-06-22 NOTE — ED Provider Notes (Signed)
Medical screening examination/treatment/procedure(s) were performed by non-physician practitioner and as supervising physician I was immediately available for consultation/collaboration.    Vassie Kugel D Nitya Cauthon, MD 06/22/13 0949 

## 2013-07-30 ENCOUNTER — Emergency Department (HOSPITAL_COMMUNITY)
Admission: EM | Admit: 2013-07-30 | Discharge: 2013-07-30 | Disposition: A | Discharge status: Home / Self Care | Payer: Medicaid Other | Attending: Emergency Medicine | Admitting: Emergency Medicine

## 2013-07-30 ENCOUNTER — Encounter (HOSPITAL_COMMUNITY): Payer: Self-pay | Admitting: Nurse Practitioner

## 2013-07-30 DIAGNOSIS — Z8701 Personal history of pneumonia (recurrent): Secondary | ICD-10-CM | POA: Insufficient documentation

## 2013-07-30 DIAGNOSIS — G8929 Other chronic pain: Secondary | ICD-10-CM | POA: Insufficient documentation

## 2013-07-30 DIAGNOSIS — M25569 Pain in unspecified knee: Secondary | ICD-10-CM | POA: Insufficient documentation

## 2013-07-30 DIAGNOSIS — J4489 Other specified chronic obstructive pulmonary disease: Secondary | ICD-10-CM | POA: Insufficient documentation

## 2013-07-30 DIAGNOSIS — M545 Low back pain, unspecified: Secondary | ICD-10-CM | POA: Insufficient documentation

## 2013-07-30 DIAGNOSIS — Z79899 Other long term (current) drug therapy: Secondary | ICD-10-CM | POA: Insufficient documentation

## 2013-07-30 DIAGNOSIS — M109 Gout, unspecified: Secondary | ICD-10-CM

## 2013-07-30 DIAGNOSIS — J449 Chronic obstructive pulmonary disease, unspecified: Secondary | ICD-10-CM | POA: Insufficient documentation

## 2013-07-30 DIAGNOSIS — Z9981 Dependence on supplemental oxygen: Secondary | ICD-10-CM | POA: Insufficient documentation

## 2013-07-30 DIAGNOSIS — I209 Angina pectoris, unspecified: Secondary | ICD-10-CM | POA: Insufficient documentation

## 2013-07-30 DIAGNOSIS — I10 Essential (primary) hypertension: Secondary | ICD-10-CM | POA: Insufficient documentation

## 2013-07-30 DIAGNOSIS — R42 Dizziness and giddiness: Secondary | ICD-10-CM | POA: Insufficient documentation

## 2013-07-30 DIAGNOSIS — F17200 Nicotine dependence, unspecified, uncomplicated: Secondary | ICD-10-CM | POA: Insufficient documentation

## 2013-07-30 DIAGNOSIS — M25549 Pain in joints of unspecified hand: Secondary | ICD-10-CM | POA: Insufficient documentation

## 2013-07-30 DIAGNOSIS — R51 Headache: Secondary | ICD-10-CM | POA: Insufficient documentation

## 2013-07-30 MED ORDER — OXYCODONE-ACETAMINOPHEN 5-325 MG PO TABS: 1.0000 | ORAL_TABLET | ORAL | Status: DC | PRN

## 2013-07-30 MED ORDER — PREDNISONE 20 MG PO TABS: ORAL_TABLET | ORAL | Status: DC

## 2013-07-30 MED ORDER — OXYCODONE-ACETAMINOPHEN 5-325 MG PO TABS
1.0000 | ORAL_TABLET | Freq: Once | ORAL | Status: AC
  Administered 2013-07-30: 1 via ORAL
  Filled 2013-07-30: qty 1

## 2013-07-30 MED ORDER — PREDNISONE 20 MG PO TABS
40.0000 mg | ORAL_TABLET | Freq: Once | ORAL | Status: AC
  Administered 2013-07-30: 40 mg via ORAL
  Filled 2013-07-30: qty 2

## 2013-07-30 MED ORDER — NAPROXEN 500 MG PO TABS: 500.0000 mg | ORAL_TABLET | Freq: Two times a day (BID) | ORAL | Status: DC

## 2013-07-30 NOTE — ED Notes (Addendum)
Pt noted on discharge his O2 tank empty. Arranged transport back to home via [facility].

## 2013-07-30 NOTE — ED Notes (Addendum)
Per ems: pt called for R lower arm and R knee pain onset last night. "feels like gout." VSS en route. Pt on 2L home O2.

## 2013-07-30 NOTE — ED Provider Notes (Signed)
CSN: [id number]     Arrival date & time 07/30/13  1223 History   First MD Initiated Contact with Patient 07/30/13 1230     Chief Complaint  Patient presents with  . Gout   (Consider location/radiation/quality/duration/timing/severity/associated sxs/prior Treatment) HPI Comments: Patient presents complaining of his typical gout pain in his right hand and right knee, also occasional occipital headaches, also right lower back pain. Denies any injury prior to the onset of this pain. Patient states that he has had all of these pains for the past several days. States that he recently had a gout flare last month and states this is because his primary care provider through way his medications. Headache is in his occiput and bilateral sides of his neck. It feels like a tightness and is relieved with massage. Right lower back pain feels like aching and does not radiate. Denies any fevers chills, focal neurological deficits. States that the pain in his knee and his hand are exactly the same as his prior gout pain. Patient states that he does eat a lot of red meat and drinks beer. He does not adhere to a diet to prevent gout flares. Patient was recently hospitalized with "walking pneumonia"-states that he has not had any fevers or shortness of breath or cough.  The history is provided by the patient.    Past Medical History  Diagnosis Date  . Hypertension   . Gout   . Angina pectoris   . COPD (chronic obstructive pulmonary disease)   . Asthma   . Exertional shortness of breath   . Arthritis   . Chronic lower back pain   . Pneumonia     "recently had walking pneumonia" (05/12/2013)  . On home oxygen therapy     "2L 24/7" (05/12/2013)   Past Surgical History  Procedure Laterality Date  . No past surgeries     Family History  Problem Relation Age of Onset  . Diabetes type II Mother   . Depression Father   . Asthma Brother    History  Substance Use Topics  . Smoking status: Current Every Day  Smoker -- 2.00 packs/day for 40 years    Types: Cigarettes  . Smokeless tobacco: Never Used     Comment: 05/12/2013 'eased off smoking since last month"  . Alcohol Use: 42.0 oz/week    70 Cans of beer per week     Comment: 05/12/2013 "usually drink 3, 40oz/day"    Review of Systems  Constitutional: Negative for fever and chills.  Respiratory: Negative for cough and shortness of breath.   Cardiovascular: Negative for chest pain.  Gastrointestinal: Negative for nausea, vomiting, abdominal pain and diarrhea.  Musculoskeletal: Positive for back pain and arthralgias.  Neurological: Positive for light-headedness and headaches. Negative for syncope, weakness and numbness.    Allergies  Review of patient's allergies indicates no known allergies.  Home Medications   Current Outpatient Rx  Name  Route  Sig  Dispense  Refill  . albuterol (PROVENTIL HFA;VENTOLIN HFA) 108 (90 BASE) MCG/ACT inhaler   Inhalation   Inhale 1-2 puffs into the lungs every 6 (six) hours as needed for wheezing or shortness of breath.   2 Inhaler   5   . colchicine 0.6 MG tablet   Oral   Take 1 tablet (0.6 mg total) by mouth 2 (two) times daily.   8 tablet   0   . levofloxacin (LEVAQUIN) 500 MG tablet   Oral   Take 1 tablet (500 mg total)  by mouth daily.   3 tablet   0   . lisinopril-hydrochlorothiazide (PRINZIDE) 10-12.5 MG per tablet   Oral   Take 1 tablet by mouth daily.   30 tablet   1   . lisinopril-hydrochlorothiazide (PRINZIDE,ZESTORETIC) 10-12.5 MG per tablet   Oral   Take 1 tablet by mouth daily.   30 tablet   1   . naproxen (NAPROSYN) 500 MG tablet   Oral   Take 1 tablet (500 mg total) by mouth 2 (two) times daily with a meal.   30 tablet   0   . naproxen (NAPROSYN) 500 MG tablet   Oral   Take 1 tablet (500 mg total) by mouth 2 (two) times daily.   30 tablet   0   . oxyCODONE-acetaminophen (PERCOCET) 5-325 MG per tablet   Oral   Take 1 tablet by mouth every 4 (four) hours as  needed for pain.   15 tablet   0   . predniSONE (DELTASONE) 20 MG tablet      3 tabs po daily x 3 days, then 2 tabs x 3 days, then 1.5 tabs x 3 days, then 1 tab x 3 days, then 0.5 tabs x 3 days   27 tablet   0   . tiotropium (SPIRIVA) 18 MCG inhalation capsule   Inhalation   Place 1 capsule (18 mcg total) into inhaler and inhale daily.   30 capsule   12    BP 175/107  Pulse 116  Temp(Src) 99.4 F (37.4 C) (Oral)  Resp 20  SpO2 98% Physical Exam  Nursing note and vitals reviewed. Constitutional: He appears well-developed and well-nourished. No distress.  HENT:  Head: Normocephalic and atraumatic.  Neck: Neck supple.  Cardiovascular: Normal rate and regular rhythm.   Pulmonary/Chest: Effort normal and breath sounds normal. No respiratory distress. He has no wheezes. He has no rales.  Abdominal: Soft. He exhibits no distension and no mass. There is no tenderness. There is no rebound and no guarding.  Musculoskeletal:  Spine nontender, no crepitus, or steopoffs.  Lower extremities:  Strength 5/5, sensation intact, distal pulses intact.     Bilateral knees with mild tenderness.  Left knee appears edematous but is nontender.   Right knee with diffuse tenderness and mild warmth. Right hand and wrist with diffuse tenderness mild erythema and mild warmth. Tender to palpation. Right elbow also with mild tenderness to palpation but without erythema or warmth. Right forearm is unremarkable. Capillary refill less than 2 seconds, sensation intact, patient is able to move all digits of right hand.   Neurological: He is alert. He exhibits normal muscle tone.  Skin: He is not diaphoretic.    ED Course  Procedures (including critical care time) Labs Review Labs Reviewed - No data to display Imaging Review No results found.  Filed Vitals:   07/30/13 1223  BP: 175/107  Pulse: 116  Temp: 99.4 F (37.4 C)  Resp: 20     MDM   1. Gout attack   2. Headache   3. Low back pain     Patient with history of gout presenting for a gout flare. Pain and swelling in his right hand and wrist and right knee identified as exactly like his previous gout pain. Doubt septic joint. Patient is afebrile nontoxic. Patient is tachycardic and hypertensive, likely due to pain. He also notes that his primary care provider recently changed his blood pressure medication and he thinks it is not the correct medication.  I have advised that he follow up with his PCP as soon as possible and to recheck his blood pressure when his pain is controlled. Patient also complains of intermittent headache and intermittent right lower back pain. Right lower back pain appears to be musculoskeletal. There are no red flags. Headache is only present at night, and by description appears to be a tension headache, but the with massage. No headache currently.  No chest pain, shortness of breath, altered mental status Discussed all results with patient.  Pt given return precautions.  Pt verbalizes understanding and agrees with plan.     I doubt any other EMC precluding discharge at this time including, but not necessarily limited to the following: hypertensive emergency, septic joint    Trixie Dredge, PA-C 07/30/13 1551

## 2013-08-03 NOTE — ED Provider Notes (Signed)
Medical screening examination/treatment/procedure(s) were performed by non-physician practitioner and as supervising physician I was immediately available for consultation/collaboration.  Brandt T Kc Sedlak, MD 08/03/13 0829 

## 2013-08-06 ENCOUNTER — Telehealth: Payer: Self-pay

## 2013-08-06 NOTE — Telephone Encounter (Addendum)
error 

## 2013-08-08 ENCOUNTER — Emergency Department (HOSPITAL_COMMUNITY)
Admission: EM | Admit: 2013-08-08 | Discharge: 2013-08-09 | Disposition: A | Discharge status: Home / Self Care | Payer: Medicaid Other | Attending: Emergency Medicine | Admitting: Emergency Medicine

## 2013-08-08 ENCOUNTER — Encounter (HOSPITAL_COMMUNITY): Payer: Self-pay | Admitting: Emergency Medicine

## 2013-08-08 DIAGNOSIS — F17200 Nicotine dependence, unspecified, uncomplicated: Secondary | ICD-10-CM | POA: Insufficient documentation

## 2013-08-08 DIAGNOSIS — J449 Chronic obstructive pulmonary disease, unspecified: Secondary | ICD-10-CM | POA: Insufficient documentation

## 2013-08-08 DIAGNOSIS — M545 Low back pain, unspecified: Secondary | ICD-10-CM | POA: Insufficient documentation

## 2013-08-08 DIAGNOSIS — I10 Essential (primary) hypertension: Secondary | ICD-10-CM | POA: Insufficient documentation

## 2013-08-08 DIAGNOSIS — J4489 Other specified chronic obstructive pulmonary disease: Secondary | ICD-10-CM | POA: Insufficient documentation

## 2013-08-08 DIAGNOSIS — Z8701 Personal history of pneumonia (recurrent): Secondary | ICD-10-CM | POA: Insufficient documentation

## 2013-08-08 DIAGNOSIS — F1010 Alcohol abuse, uncomplicated: Secondary | ICD-10-CM | POA: Insufficient documentation

## 2013-08-08 DIAGNOSIS — M109 Gout, unspecified: Secondary | ICD-10-CM | POA: Insufficient documentation

## 2013-08-08 DIAGNOSIS — F10929 Alcohol use, unspecified with intoxication, unspecified: Secondary | ICD-10-CM

## 2013-08-08 DIAGNOSIS — R0602 Shortness of breath: Secondary | ICD-10-CM | POA: Insufficient documentation

## 2013-08-08 DIAGNOSIS — G8929 Other chronic pain: Secondary | ICD-10-CM | POA: Insufficient documentation

## 2013-08-08 DIAGNOSIS — Z9981 Dependence on supplemental oxygen: Secondary | ICD-10-CM | POA: Insufficient documentation

## 2013-08-08 DIAGNOSIS — Z79899 Other long term (current) drug therapy: Secondary | ICD-10-CM | POA: Insufficient documentation

## 2013-08-08 MED ORDER — ALBUTEROL SULFATE HFA 108 (90 BASE) MCG/ACT IN AERS: 4.0000 | INHALATION_SPRAY | RESPIRATORY_TRACT | Status: DC | PRN

## 2013-08-08 NOTE — ED Notes (Signed)
Pt brought in by EMS. According to family and witness on the scene, pt was having "seizures". However, when the EMT told him to stop shaking, he did so. EMS reports that the pt had 9 40 oz cans of beer today. Pt also complained of SOB while en route to the hospital, but when his oxygen was turned up to 6 L, the SOB resided. Pt with hx of COPD, and wears home oxygen at 2 L.

## 2013-08-09 ENCOUNTER — Emergency Department (HOSPITAL_COMMUNITY): Payer: Medicaid Other

## 2013-08-09 LAB — CBC WITH DIFFERENTIAL/PLATELET
Basophils Absolute: 0 10*3/uL (ref 0.0–0.1)
HCT: 36.4 % — ABNORMAL LOW (ref 39.0–52.0)
Hemoglobin: 12.9 g/dL — ABNORMAL LOW (ref 13.0–17.0)
Lymphocytes Relative: 33 % (ref 12–46)
Monocytes Absolute: 0.5 10*3/uL (ref 0.1–1.0)
Monocytes Relative: 5 % (ref 3–12)
Neutro Abs: 5.4 10*3/uL (ref 1.7–7.7)
RDW: 16.9 % — ABNORMAL HIGH (ref 11.5–15.5)
WBC: 9 10*3/uL (ref 4.0–10.5)

## 2013-08-09 LAB — BASIC METABOLIC PANEL
Chloride: 96 mEq/L (ref 96–112)
GFR calc Af Amer: >90 mL/min (ref >90)
Potassium: 3.5 mEq/L (ref 3.5–5.1)
Sodium: 135 mEq/L (ref 135–145)

## 2013-08-09 LAB — ETHANOL: Alcohol, Ethyl (B): 199 mg/dL — ABNORMAL HIGH (ref 0–11)

## 2013-08-09 IMAGING — CR DG CHEST 2V
2 series · 2 of 2 positions shown · non-contrast
Comparison: [DATE]

CLINICAL DATA: Cough, shortness of breath, chest pain, reported
seizure-like activity, patient confused, altered mental status,
history COPD

CHEST - 2 VIEW

[w chest lat]
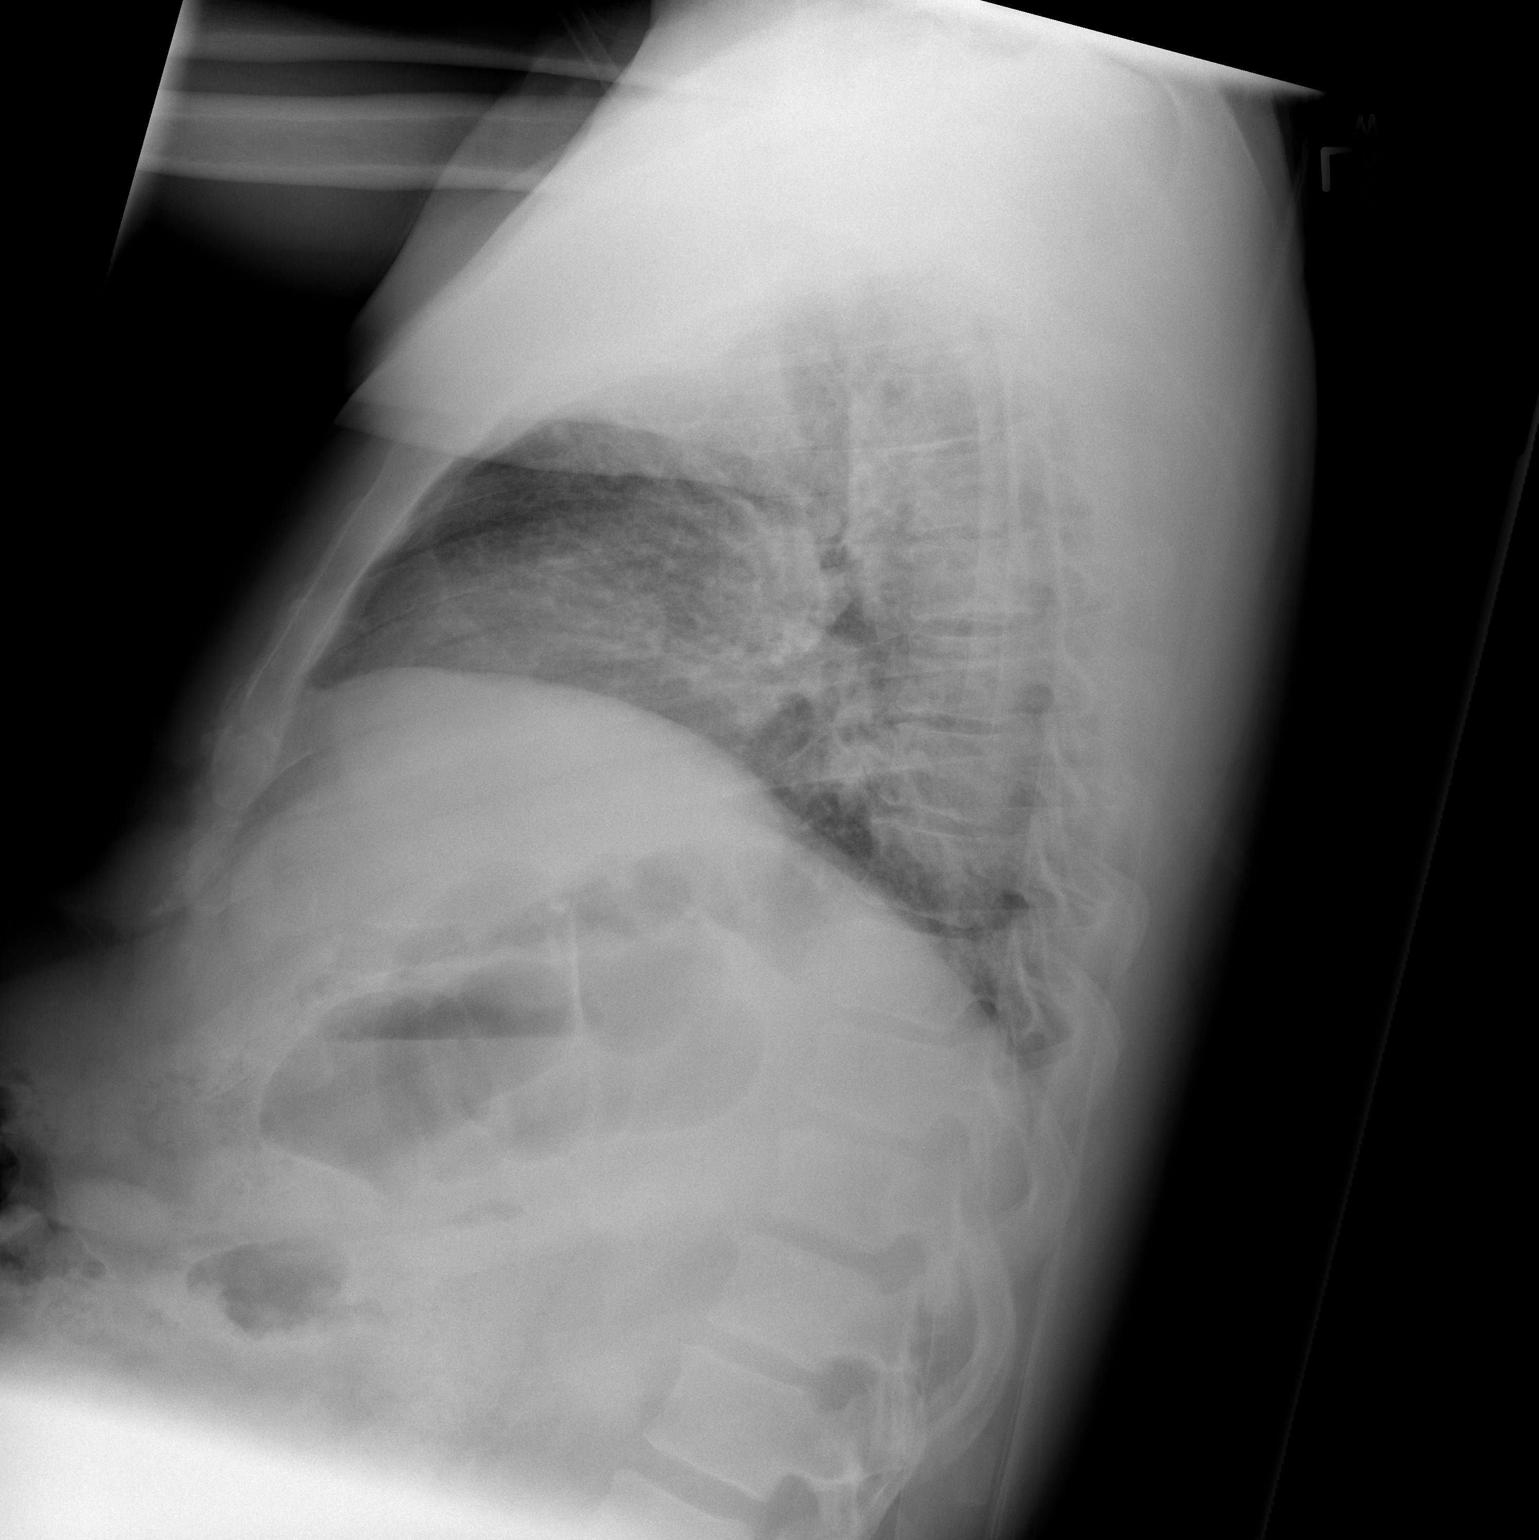

[view not recorded]
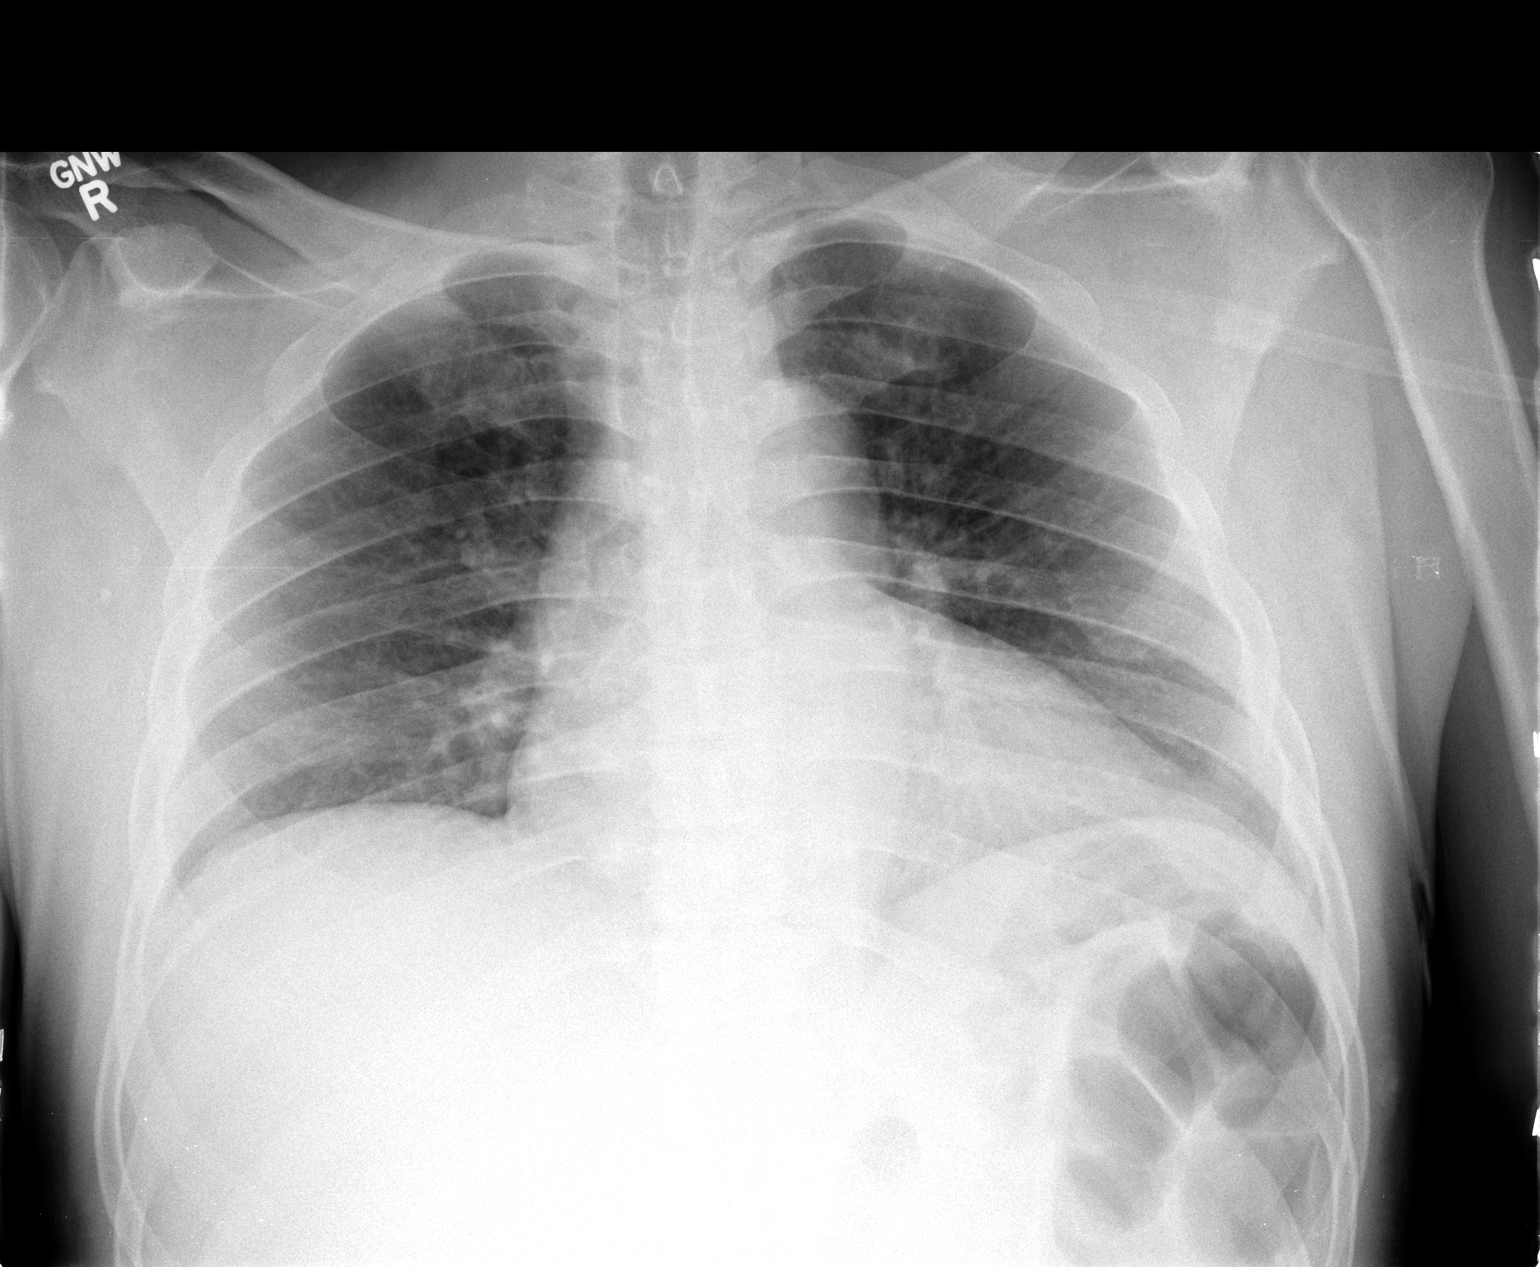

[2 of 2 positions shown; findings below may reference images not displayed]

FINDINGS: Enlargement of cardiac silhouette.
Tortuous aorta.
Pulmonary vascularity normal.
Low lung volumes with minimal bibasilar atelectasis.
Lungs otherwise clear.
No pleural effusion or pneumothorax.
Bones unremarkable.
IMPRESSION: Low lung volumes with minimal bibasilar atelectasis.

## 2013-08-09 MED ORDER — HYDROCHLOROTHIAZIDE 12.5 MG PO CAPS
12.5000 mg | ORAL_CAPSULE | Freq: Every day | ORAL | Status: DC
  Filled 2013-08-09: qty 1

## 2013-08-09 MED ORDER — IBUPROFEN 800 MG PO TABS
800.0000 mg | ORAL_TABLET | Freq: Once | ORAL | Status: AC
  Administered 2013-08-09: 800 mg via ORAL
  Filled 2013-08-09: qty 1

## 2013-08-09 MED ORDER — LISINOPRIL 10 MG PO TABS
10.0000 mg | ORAL_TABLET | Freq: Once | ORAL | Status: AC
  Administered 2013-08-09: 10 mg via ORAL
  Filled 2013-08-09: qty 1

## 2013-08-09 MED ORDER — HYDROCHLOROTHIAZIDE 12.5 MG PO CAPS
12.5000 mg | ORAL_CAPSULE | Freq: Every day | ORAL | Status: DC
  Administered 2013-08-09: 12.5 mg via ORAL

## 2013-08-09 NOTE — ED Provider Notes (Signed)
CSN: [id number]     Arrival date & time 08/08/13  2328 History   First MD Initiated Contact with Patient 08/08/13 2356     Chief Complaint  Patient presents with  . Illegal value: [    Family reports seizure-like activity  . Alcohol Intoxication   (Consider location/radiation/quality/duration/timing/severity/associated sxs/prior Treatment) HPI Comments: Pt brought in by EMS. According to family pt was having "seizures". However, when the EMT told him to stop shaking, he did. EMS reports that the pt had 9 40 oz beers today. Pt also complained of SOB while en route to the hospital, but when his oxygen was turned up to 6 L, the SOB resided. Pt with hx of COPD, and wears home oxygen at 2 L.  He denies current CP, fever, ab pain, n/v, d/a.  He has L knee pain from recent gout flare.   Patient is a 52 y.o. male presenting with intoxication. The history is provided by the patient and the EMS personnel. The history is limited by the condition of the patient. No language interpreter was used.  Alcohol Intoxication This is a new problem. The current episode started 6 to 12 hours ago. The problem occurs constantly. The problem has not changed since onset.Associated symptoms include chest pain (for two days, resolved 2 days ago) and shortness of breath. Pertinent negatives include no abdominal pain and no headaches. Nothing aggravates the symptoms. Nothing relieves the symptoms. He has tried nothing for the symptoms. The treatment provided no relief.    Past Medical History  Diagnosis Date  . Hypertension   . Gout   . Angina pectoris   . COPD (chronic obstructive pulmonary disease)   . Asthma   . Exertional shortness of breath   . Arthritis   . Chronic lower back pain   . Pneumonia     "recently had walking pneumonia" (05/12/2013)  . On home oxygen therapy     "2L 24/7" (05/12/2013)   Past Surgical History  Procedure Laterality Date  . No past surgeries     Family History  Problem Relation Age  of Onset  . Diabetes type II Mother   . Depression Father   . Asthma Brother    History  Substance Use Topics  . Smoking status: Current Every Day Smoker -- 2.00 packs/day for 40 years    Types: Cigarettes  . Smokeless tobacco: Never Used     Comment: 05/12/2013 'eased off smoking since last month"  . Alcohol Use: 42.0 oz/week    70 Cans of beer per week     Comment: 05/12/2013 "usually drink 3, 40oz/day"    Review of Systems  Constitutional: Negative for fever, activity change, appetite change and fatigue.  HENT: Negative for congestion, facial swelling, rhinorrhea and trouble swallowing.   Eyes: Negative for photophobia and pain.  Respiratory: Positive for shortness of breath. Negative for cough and chest tightness.   Cardiovascular: Positive for chest pain (for two days, resolved 2 days ago). Negative for leg swelling.  Gastrointestinal: Negative for nausea, vomiting, abdominal pain, diarrhea and constipation.  Endocrine: Negative for polydipsia and polyuria.  Genitourinary: Negative for dysuria, urgency, decreased urine volume and difficulty urinating.  Musculoskeletal: Negative for back pain and gait problem.  Skin: Negative for color change, rash and wound.  Allergic/Immunologic: Negative for immunocompromised state.  Neurological: Negative for dizziness, facial asymmetry, speech difficulty, weakness, numbness and headaches.  Psychiatric/Behavioral: Negative for confusion, decreased concentration and agitation.    Allergies  Review of patient's allergies indicates  no known allergies.  Home Medications   Current Outpatient Rx  Name  Route  Sig  Dispense  Refill  . albuterol (PROVENTIL HFA;VENTOLIN HFA) 108 (90 BASE) MCG/ACT inhaler   Inhalation   Inhale 1-2 puffs into the lungs every 6 (six) hours as needed for wheezing or shortness of breath.   2 Inhaler   5   . colchicine 0.6 MG tablet   Oral   Take 1 tablet (0.6 mg total) by mouth 2 (two) times daily.   8  tablet   0   . levofloxacin (LEVAQUIN) 500 MG tablet   Oral   Take 1 tablet (500 mg total) by mouth daily.   3 tablet   0   . lisinopril-hydrochlorothiazide (PRINZIDE) 10-12.5 MG per tablet   Oral   Take 1 tablet by mouth daily.   30 tablet   1   . lisinopril-hydrochlorothiazide (PRINZIDE,ZESTORETIC) 10-12.5 MG per tablet   Oral   Take 1 tablet by mouth daily.   30 tablet   1   . naproxen (NAPROSYN) 500 MG tablet   Oral   Take 1 tablet (500 mg total) by mouth 2 (two) times daily with a meal.   30 tablet   0   . naproxen (NAPROSYN) 500 MG tablet   Oral   Take 1 tablet (500 mg total) by mouth 2 (two) times daily.   30 tablet   0   . oxyCODONE-acetaminophen (PERCOCET) 5-325 MG per tablet   Oral   Take 1 tablet by mouth every 4 (four) hours as needed for pain.   15 tablet   0   . predniSONE (DELTASONE) 20 MG tablet      3 tabs po daily x 3 days, then 2 tabs x 3 days, then 1.5 tabs x 3 days, then 1 tab x 3 days, then 0.5 tabs x 3 days   27 tablet   0   . tiotropium (SPIRIVA) 18 MCG inhalation capsule   Inhalation   Place 1 capsule (18 mcg total) into inhaler and inhale daily.   30 capsule   12    BP 171/127  Pulse 87  Resp 18  SpO2 98% Physical Exam  Constitutional: He is oriented to person, place, and time. He appears well-developed and well-nourished. No distress.  Disheveled, slurred speech, smells of ETOH   HENT:  Head: Normocephalic and atraumatic.  Mouth/Throat: No oropharyngeal exudate.  Eyes: Pupils are equal, round, and reactive to light.  Neck: Normal range of motion. Neck supple.  Cardiovascular: Normal rate, regular rhythm and normal heart sounds.  Exam reveals no gallop and no friction rub.   No murmur heard. Pulmonary/Chest: Effort normal and breath sounds normal. No respiratory distress. He has no wheezes. He has no rales.  Abdominal: Soft. Bowel sounds are normal. He exhibits no distension and no mass. There is no tenderness. There is no  rebound and no guarding.  Musculoskeletal: Normal range of motion. He exhibits no edema and no tenderness.  Neurological: He is alert and oriented to person, place, and time. No cranial nerve deficit or sensory deficit. He exhibits abnormal muscle tone. GCS eye subscore is 4. GCS verbal subscore is 4. GCS motor subscore is 6.  Skin: Skin is warm and dry.  Psychiatric: He has a normal mood and affect.    ED Course  Procedures (including critical care time) Labs Review Labs Reviewed  CBC WITH DIFFERENTIAL   Imaging Review Dg Chest 2 View  08/09/2013   *RADIOLOGY  REPORT*  Clinical Data: Cough, shortness of breath, chest pain, reported seizure-like activity, patient confused, altered mental status, history COPD  CHEST - 2 VIEW  Comparison: 05/12/2013  Findings: Enlargement of cardiac silhouette. Tortuous aorta. Pulmonary vascularity normal. Low lung volumes with minimal bibasilar atelectasis. Lungs otherwise clear. No pleural effusion or pneumothorax. Bones unremarkable.  IMPRESSION: Low lung volumes with minimal bibasilar atelectasis.   Original Report Authenticated By: Ulyses Southward, M.D.     Date: 08/09/2013  Rate: 89  Rhythm: normal sinus rhythm  QRS Axis: borderline R axis deviation  Intervals: normal  ST/T Wave abnormalities: nonspecific ST/T changes  Conduction Disutrbances:LVH with repol changes  Narrative Interpretation:   Old EKG Reviewed: unchanged    MDM   1. Alcohol intoxication   2. Shortness of breath    Pt is a 52 y.o. male with Pmhx as above who presents by EMS complaining of SOB and is intoxicated.  Per EMS report, family witnessed seizure lke activity at home, but stopped shaking when EMS asked him to.  He had 9 40oz beers today.  On exam, pt does not know why he is here, but states he is SOB and that he had 2 days of CP while resolved 2 days ago.  No signs of trauma or exam, no focal neuro fidings.    7:10 AM After allowing pt to metabolize several hours in dept, pt  awake, alert, appropriate, no longer slurring speech.  He complains of h/a, not sure how he got to the ED.  Denies CP, SOB.  Pt able to ambulate in hall.  Trop not elevated, CBC, BMP, BNP, trop, CXR unremarkable.  I believe pt safe for d/c.  Will recommend close PCP f/u, Return precautions given for new or worsening symptoms including SOB, fever, CP.   1. Alcohol intoxication   2. Shortness of breath         Shanna Cisco, MD 08/09/13 1902

## 2013-08-09 NOTE — ED Notes (Signed)
Ambulated with pt. Pt states that he usually walks with a cane. Pt states that he is dizzy, and this RN notes that he is not steady on his feet.

## 2013-09-27 ENCOUNTER — Emergency Department (HOSPITAL_COMMUNITY): Payer: Medicaid Other

## 2013-09-27 ENCOUNTER — Encounter (HOSPITAL_COMMUNITY): Payer: Self-pay | Admitting: Emergency Medicine

## 2013-09-27 ENCOUNTER — Emergency Department (HOSPITAL_COMMUNITY)
Admission: EM | Admit: 2013-09-27 | Discharge: 2013-09-27 | Disposition: A | Discharge status: Home / Self Care | Payer: Medicaid Other | Attending: Emergency Medicine | Admitting: Emergency Medicine

## 2013-09-27 DIAGNOSIS — M940 Chondrocostal junction syndrome [Tietze]: Secondary | ICD-10-CM | POA: Insufficient documentation

## 2013-09-27 DIAGNOSIS — I209 Angina pectoris, unspecified: Secondary | ICD-10-CM | POA: Insufficient documentation

## 2013-09-27 DIAGNOSIS — J441 Chronic obstructive pulmonary disease with (acute) exacerbation: Secondary | ICD-10-CM | POA: Insufficient documentation

## 2013-09-27 DIAGNOSIS — M129 Arthropathy, unspecified: Secondary | ICD-10-CM | POA: Insufficient documentation

## 2013-09-27 DIAGNOSIS — Z8701 Personal history of pneumonia (recurrent): Secondary | ICD-10-CM | POA: Insufficient documentation

## 2013-09-27 DIAGNOSIS — F17200 Nicotine dependence, unspecified, uncomplicated: Secondary | ICD-10-CM | POA: Insufficient documentation

## 2013-09-27 DIAGNOSIS — N179 Acute kidney failure, unspecified: Secondary | ICD-10-CM | POA: Insufficient documentation

## 2013-09-27 DIAGNOSIS — M109 Gout, unspecified: Secondary | ICD-10-CM | POA: Insufficient documentation

## 2013-09-27 DIAGNOSIS — I10 Essential (primary) hypertension: Secondary | ICD-10-CM | POA: Insufficient documentation

## 2013-09-27 DIAGNOSIS — G8929 Other chronic pain: Secondary | ICD-10-CM | POA: Insufficient documentation

## 2013-09-27 DIAGNOSIS — Z9981 Dependence on supplemental oxygen: Secondary | ICD-10-CM | POA: Insufficient documentation

## 2013-09-27 LAB — CBC WITH DIFFERENTIAL/PLATELET
Eosinophils Relative: 1 % (ref 0–5)
Lymphocytes Relative: 34 % (ref 12–46)
Lymphs Abs: 2.7 10*3/uL (ref 0.7–4.0)
MCV: 89.7 fL (ref 78.0–100.0)
Neutro Abs: 4.7 10*3/uL (ref 1.7–7.7)
Platelets: 271 10*3/uL (ref 150–400)
RBC: 4.07 MIL/uL — ABNORMAL LOW (ref 4.22–5.81)
WBC: 7.9 10*3/uL (ref 4.0–10.5)

## 2013-09-27 LAB — POCT I-STAT, CHEM 8
BUN: 19 mg/dL (ref 6–23)
Calcium, Ion: 1.2 mmol/L (ref 1.12–1.23)
HCT: 37 % — ABNORMAL LOW (ref 39.0–52.0)
Hemoglobin: 12.6 g/dL — ABNORMAL LOW (ref 13.0–17.0)
Potassium: 3.6 mEq/L (ref 3.5–5.1)
Sodium: 141 mEq/L (ref 135–145)
TCO2: 21 mmol/L (ref 0–100)

## 2013-09-27 LAB — TROPONIN I
Troponin I: <0.3 ng/mL (ref <0.30)
Troponin I: <0.3 ng/mL (ref <0.30)

## 2013-09-27 IMAGING — CR DG CHEST 2V
2 series · 2 of 2 positions shown · non-contrast
Comparison: Chest radiograph performed [DATE]

CLINICAL DATA: Right flank pain for 3 days.

EXAM:
CHEST  2 VIEW

[w chest lat]
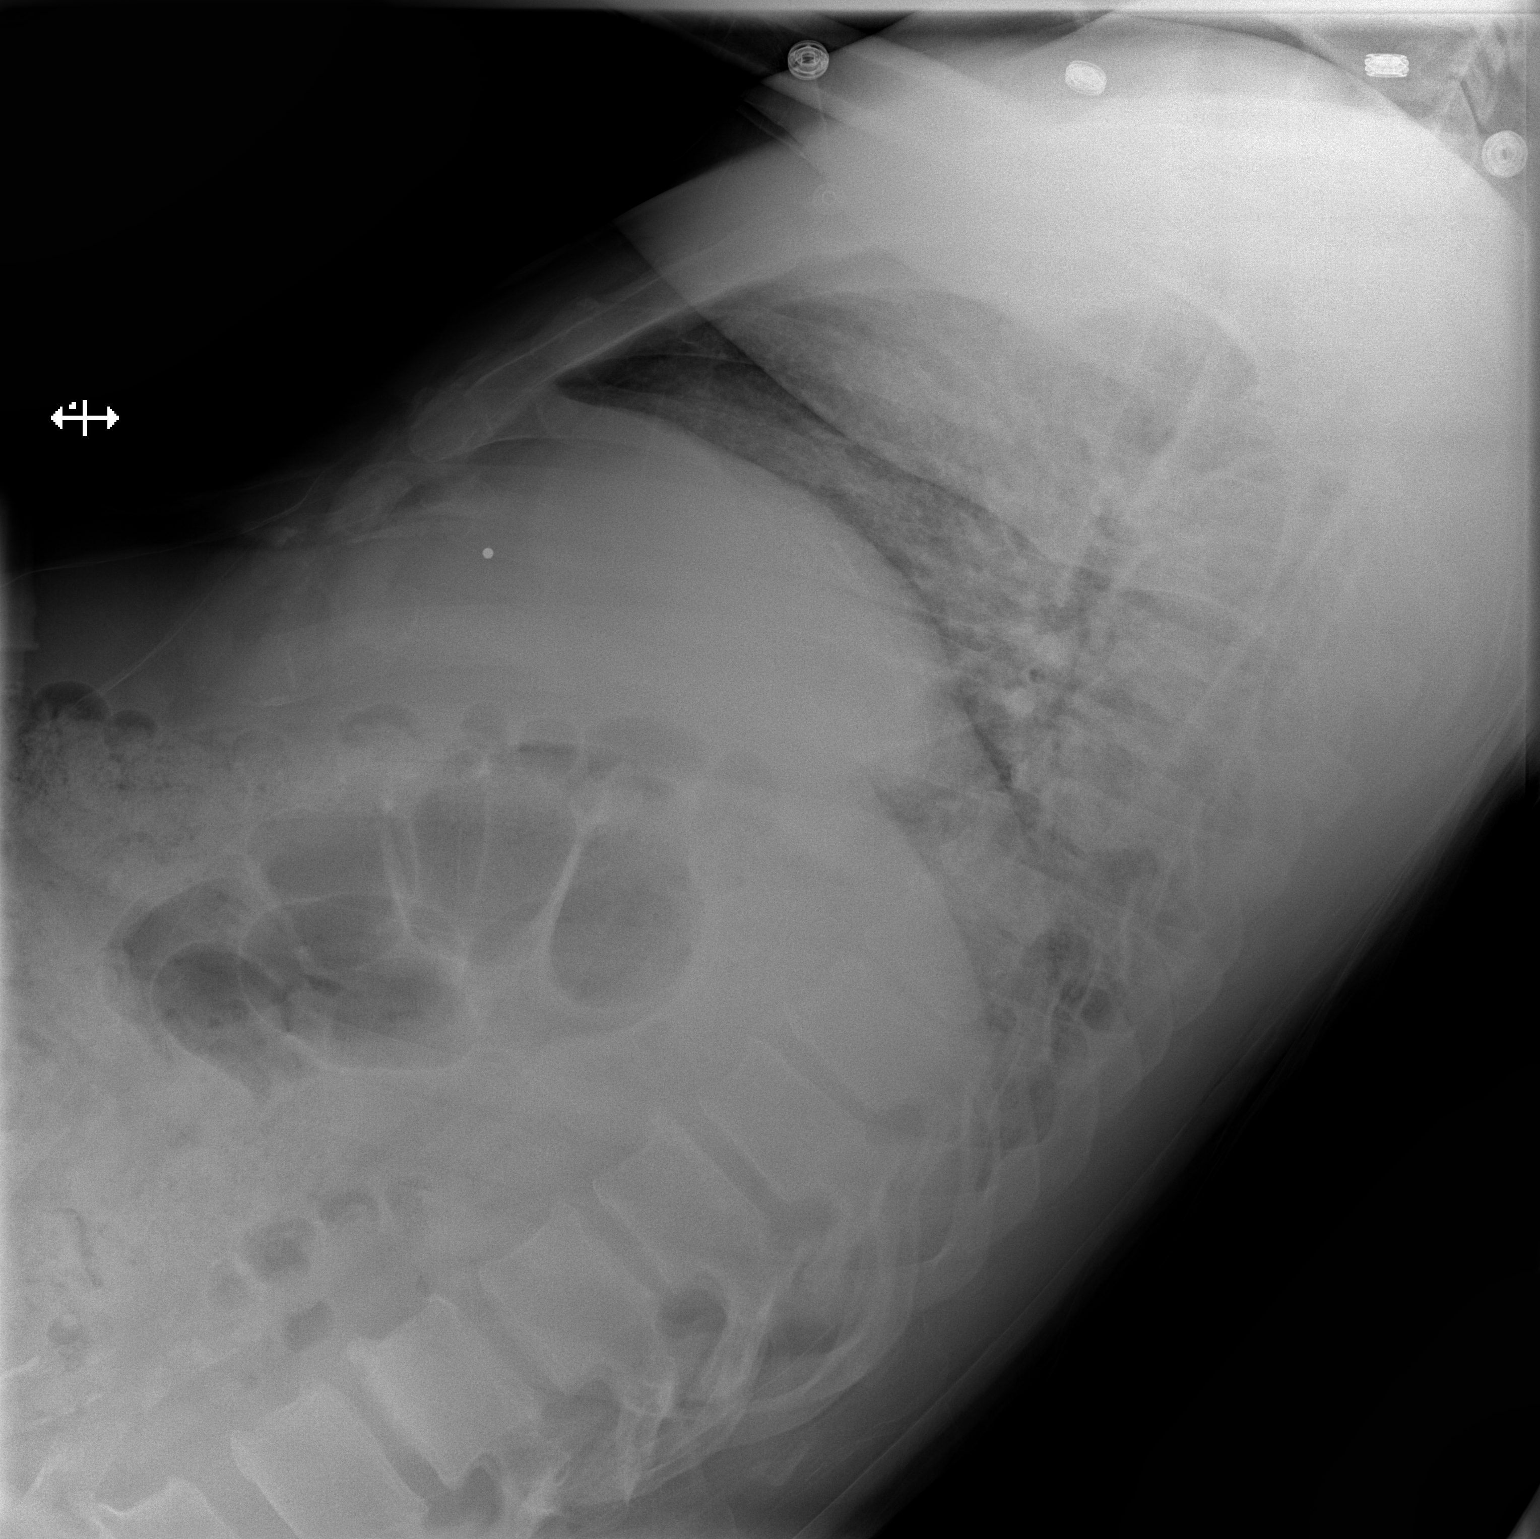

[x chest ap]
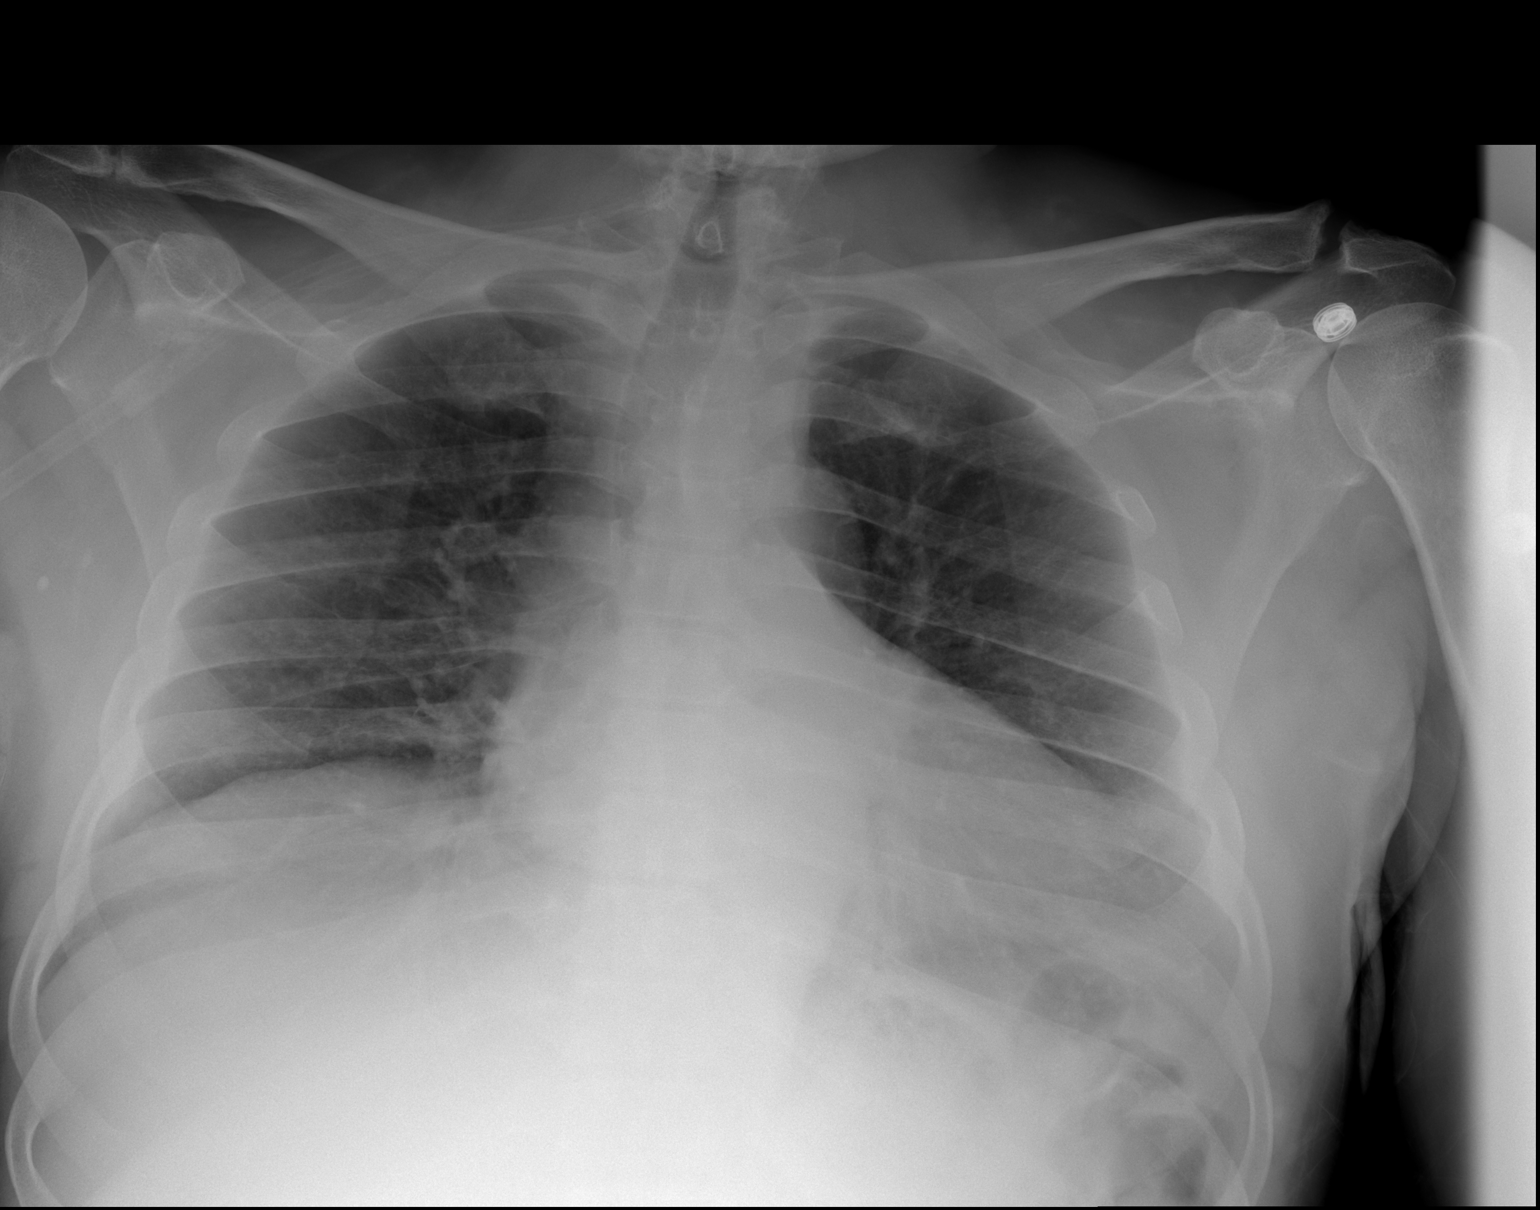

[2 of 2 positions shown; findings below may reference images not displayed]

FINDINGS: The lungs are hypoexpanded. Mild left basilar opacity likely
reflects atelectasis. No pleural effusion or pneumothorax is seen.

The heart remains normal in size. No acute osseous abnormalities are
identified.
IMPRESSION: Lungs hypoexpanded; mild left basilar airspace opacity likely
reflects atelectasis.

## 2013-09-27 MED ORDER — SODIUM CHLORIDE 0.9 % IV BOLUS (SEPSIS)
1000.0000 mL | Freq: Once | INTRAVENOUS | Status: AC
  Administered 2013-09-27: 1000 mL via INTRAVENOUS

## 2013-09-27 NOTE — ED Notes (Signed)
Pt continues to sleep but arouses to touch and voice.

## 2013-09-27 NOTE — ED Notes (Signed)
Patient transported to X-ray 

## 2013-09-27 NOTE — ED Notes (Signed)
Returned from radiology.  Pt continues to c/o right upper quad abdominal pain that comes and goes.  Pt slurring words, admits to drinking a lot of alcohol tonight.  Awaiting test results.

## 2013-09-27 NOTE — ED Notes (Signed)
No change in status.  Pt has slept peacefully for several hours.  Awaiting disposition

## 2013-09-27 NOTE — ED Notes (Signed)
Pt provided a bus pass. 

## 2013-09-27 NOTE — ED Notes (Signed)
C/o R rib/chest pain, onset 3d ago, also reports sob and cough, (denies: nvd, fever), admits to 4-40 beers tonight (was sharing them), alert/ lethargic, easily arousable, NAD, calm, interactive, speech slurred. "wears O2 at home 2L d/t PNA".

## 2013-09-27 NOTE — ED Notes (Signed)
Patient is resting comfortably. 

## 2013-10-06 NOTE — ED Provider Notes (Signed)
CSN: [id number]     Arrival date & time 09/27/13  0102 History   First MD Initiated Contact with Patient 09/27/13 0156     Chief Complaint  Patient presents with  . Cough  . Shortness of Breath  . Chest Pain   (Consider location/radiation/quality/duration/timing/severity/associated sxs/prior Treatment) Patient is a 52 y.o. male presenting with cough, shortness of breath, and chest pain. The history is provided by the patient and medical records.  Cough Cough characteristics:  Productive Sputum characteristics:  Clear Severity:  Moderate Onset quality:  Gradual Duration:  3 days Timing:  Constant Progression:  Unchanged Chronicity:  New Smoker: yes   Relieved by:  None tried Worsened by:  Nothing tried Ineffective treatments:  None tried Associated symptoms: chest pain and shortness of breath   Chest pain:    Severity:  Moderate   Timing:  Constant   Progression:  Unchanged   Chronicity:  New Shortness of Breath Associated symptoms: chest pain and cough   Associated symptoms: no abdominal pain   Chest Pain Associated symptoms: cough and shortness of breath   Associated symptoms: no abdominal pain     Past Medical History  Diagnosis Date  . Hypertension   . Gout   . Angina pectoris   . COPD (chronic obstructive pulmonary disease)   . Asthma   . Exertional shortness of breath   . Arthritis   . Chronic lower back pain   . Pneumonia     "recently had walking pneumonia" (05/12/2013)  . On home oxygen therapy     "2L 24/7" (05/12/2013)   Past Surgical History  Procedure Laterality Date  . No past surgeries     Family History  Problem Relation Age of Onset  . Diabetes type II Mother   . Depression Father   . Asthma Brother    History  Substance Use Topics  . Smoking status: Current Every Day Smoker -- 2.00 packs/day for 40 years    Types: Cigarettes  . Smokeless tobacco: Never Used     Comment: 05/12/2013 'eased off smoking since last month"  . Alcohol Use:  42.0 oz/week    70 Cans of beer per week     Comment: 05/12/2013 "usually drink 3, 40oz/day"    Review of Systems  Constitutional: Negative for activity change and appetite change.  Respiratory: Positive for cough and shortness of breath.   Cardiovascular: Positive for chest pain.  Gastrointestinal: Negative for abdominal pain.  Genitourinary: Negative for dysuria.    Allergies  Review of patient's allergies indicates no known allergies.  Home Medications  No current outpatient prescriptions on file. BP 179/106  Pulse 84  Temp(Src) 98.1 F (36.7 C) (Oral)  Resp 22  SpO2 99% Physical Exam  Nursing note and vitals reviewed. Constitutional: He is oriented to person, place, and time. He appears well-developed.  HENT:  Head: Normocephalic and atraumatic.  Eyes: Conjunctivae and EOM are normal. Pupils are equal, round, and reactive to light.  Neck: Normal range of motion. Neck supple.  Cardiovascular: Normal rate and regular rhythm.   Chest pain - reproducible with palpation  Pulmonary/Chest: Effort normal and breath sounds normal.  Abdominal: Soft. Bowel sounds are normal. He exhibits no distension. There is no tenderness. There is no rebound and no guarding.  Neurological: He is alert and oriented to person, place, and time.  Skin: Skin is warm.    ED Course  Procedures (including critical care time) Labs Review Labs Reviewed  CBC WITH DIFFERENTIAL - Abnormal;  Notable for the following:    RBC 4.07 (*)    Hemoglobin 12.3 (*)    HCT 36.5 (*)    RDW 17.9 (*)    All other components within normal limits  POCT I-STAT, CHEM 8 - Abnormal; Notable for the following:    Creatinine, Ser 1.50 (*)    Glucose, Bld 184 (*)    Hemoglobin 12.6 (*)    HCT 37.0 (*)    All other components within normal limits  TROPONIN I  TROPONIN I   Imaging Review No results found.  EKG Interpretation     Ventricular Rate:  93 PR Interval:  136 QRS Duration: 78 QT Interval:  345 QTC  Calculation: 429 R Axis:   51 Text Interpretation:  Sinus rhythm Borderline repolarization abnormality Unchanged EKG            MDM   1. Costochondritis, acute   2. AKI (acute kidney injury)    Pt comes in with cc of chest pain. Reproducible chest pain. Xrays shows no infection. EKG is WNL. Chest pain is not exertional, some pleuritic component. Appears to be costochondritis.   Derwood Kaplan, MD 10/06/13 [phone]

## 2013-10-20 ENCOUNTER — Encounter (HOSPITAL_COMMUNITY): Payer: Self-pay | Admitting: Emergency Medicine

## 2013-10-20 ENCOUNTER — Emergency Department (HOSPITAL_COMMUNITY): Payer: Medicaid Other

## 2013-10-20 ENCOUNTER — Emergency Department (HOSPITAL_COMMUNITY)
Admission: EM | Admit: 2013-10-20 | Discharge: 2013-10-20 | Disposition: A | Discharge status: Home / Self Care | Payer: Medicaid Other | Attending: Emergency Medicine | Admitting: Emergency Medicine

## 2013-10-20 DIAGNOSIS — R079 Chest pain, unspecified: Secondary | ICD-10-CM | POA: Insufficient documentation

## 2013-10-20 DIAGNOSIS — Z7982 Long term (current) use of aspirin: Secondary | ICD-10-CM | POA: Insufficient documentation

## 2013-10-20 DIAGNOSIS — J984 Other disorders of lung: Secondary | ICD-10-CM | POA: Insufficient documentation

## 2013-10-20 DIAGNOSIS — M545 Low back pain, unspecified: Secondary | ICD-10-CM | POA: Insufficient documentation

## 2013-10-20 DIAGNOSIS — I10 Essential (primary) hypertension: Secondary | ICD-10-CM | POA: Insufficient documentation

## 2013-10-20 DIAGNOSIS — J441 Chronic obstructive pulmonary disease with (acute) exacerbation: Secondary | ICD-10-CM | POA: Insufficient documentation

## 2013-10-20 DIAGNOSIS — Z9981 Dependence on supplemental oxygen: Secondary | ICD-10-CM | POA: Insufficient documentation

## 2013-10-20 DIAGNOSIS — M129 Arthropathy, unspecified: Secondary | ICD-10-CM | POA: Insufficient documentation

## 2013-10-20 DIAGNOSIS — Z79899 Other long term (current) drug therapy: Secondary | ICD-10-CM | POA: Insufficient documentation

## 2013-10-20 DIAGNOSIS — R0602 Shortness of breath: Secondary | ICD-10-CM | POA: Insufficient documentation

## 2013-10-20 DIAGNOSIS — R062 Wheezing: Secondary | ICD-10-CM | POA: Insufficient documentation

## 2013-10-20 DIAGNOSIS — Z8679 Personal history of other diseases of the circulatory system: Secondary | ICD-10-CM | POA: Insufficient documentation

## 2013-10-20 DIAGNOSIS — J45909 Unspecified asthma, uncomplicated: Secondary | ICD-10-CM | POA: Insufficient documentation

## 2013-10-20 DIAGNOSIS — Z8701 Personal history of pneumonia (recurrent): Secondary | ICD-10-CM | POA: Insufficient documentation

## 2013-10-20 DIAGNOSIS — F17200 Nicotine dependence, unspecified, uncomplicated: Secondary | ICD-10-CM | POA: Insufficient documentation

## 2013-10-20 DIAGNOSIS — M109 Gout, unspecified: Secondary | ICD-10-CM | POA: Insufficient documentation

## 2013-10-20 DIAGNOSIS — G8929 Other chronic pain: Secondary | ICD-10-CM | POA: Insufficient documentation

## 2013-10-20 DIAGNOSIS — J9819 Other pulmonary collapse: Secondary | ICD-10-CM | POA: Insufficient documentation

## 2013-10-20 LAB — CBC WITH DIFFERENTIAL/PLATELET
Basophils Absolute: 0 10*3/uL (ref 0.0–0.1)
Basophils Relative: 0 % (ref 0–1)
Eosinophils Absolute: 0.1 10*3/uL (ref 0.0–0.7)
Hemoglobin: 12.8 g/dL — ABNORMAL LOW (ref 13.0–17.0)
MCH: 30.3 pg (ref 26.0–34.0)
MCHC: 33.9 g/dL (ref 30.0–36.0)
Neutro Abs: 6.9 10*3/uL (ref 1.7–7.7)
Neutrophils Relative %: 67 % (ref 43–77)
RDW: 16.5 % — ABNORMAL HIGH (ref 11.5–15.5)
WBC: 10.3 10*3/uL (ref 4.0–10.5)

## 2013-10-20 LAB — POCT I-STAT, CHEM 8
Calcium, Ion: 1.24 mmol/L — ABNORMAL HIGH (ref 1.12–1.23)
Glucose, Bld: 148 mg/dL — ABNORMAL HIGH (ref 70–99)
HCT: 38 % — ABNORMAL LOW (ref 39.0–52.0)
Hemoglobin: 12.9 g/dL — ABNORMAL LOW (ref 13.0–17.0)
Potassium: 3.5 mEq/L (ref 3.5–5.1)
Sodium: 138 mEq/L (ref 135–145)
TCO2: 23 mmol/L (ref 0–100)

## 2013-10-20 LAB — POCT I-STAT TROPONIN I

## 2013-10-20 IMAGING — CR DG CHEST 2V
2 series · 2 of 2 positions shown · non-contrast
Comparison: Chest radiograph [DATE]

CLINICAL DATA: Shortness of breath.

EXAM:
CHEST  2 VIEW

[w chest pa]
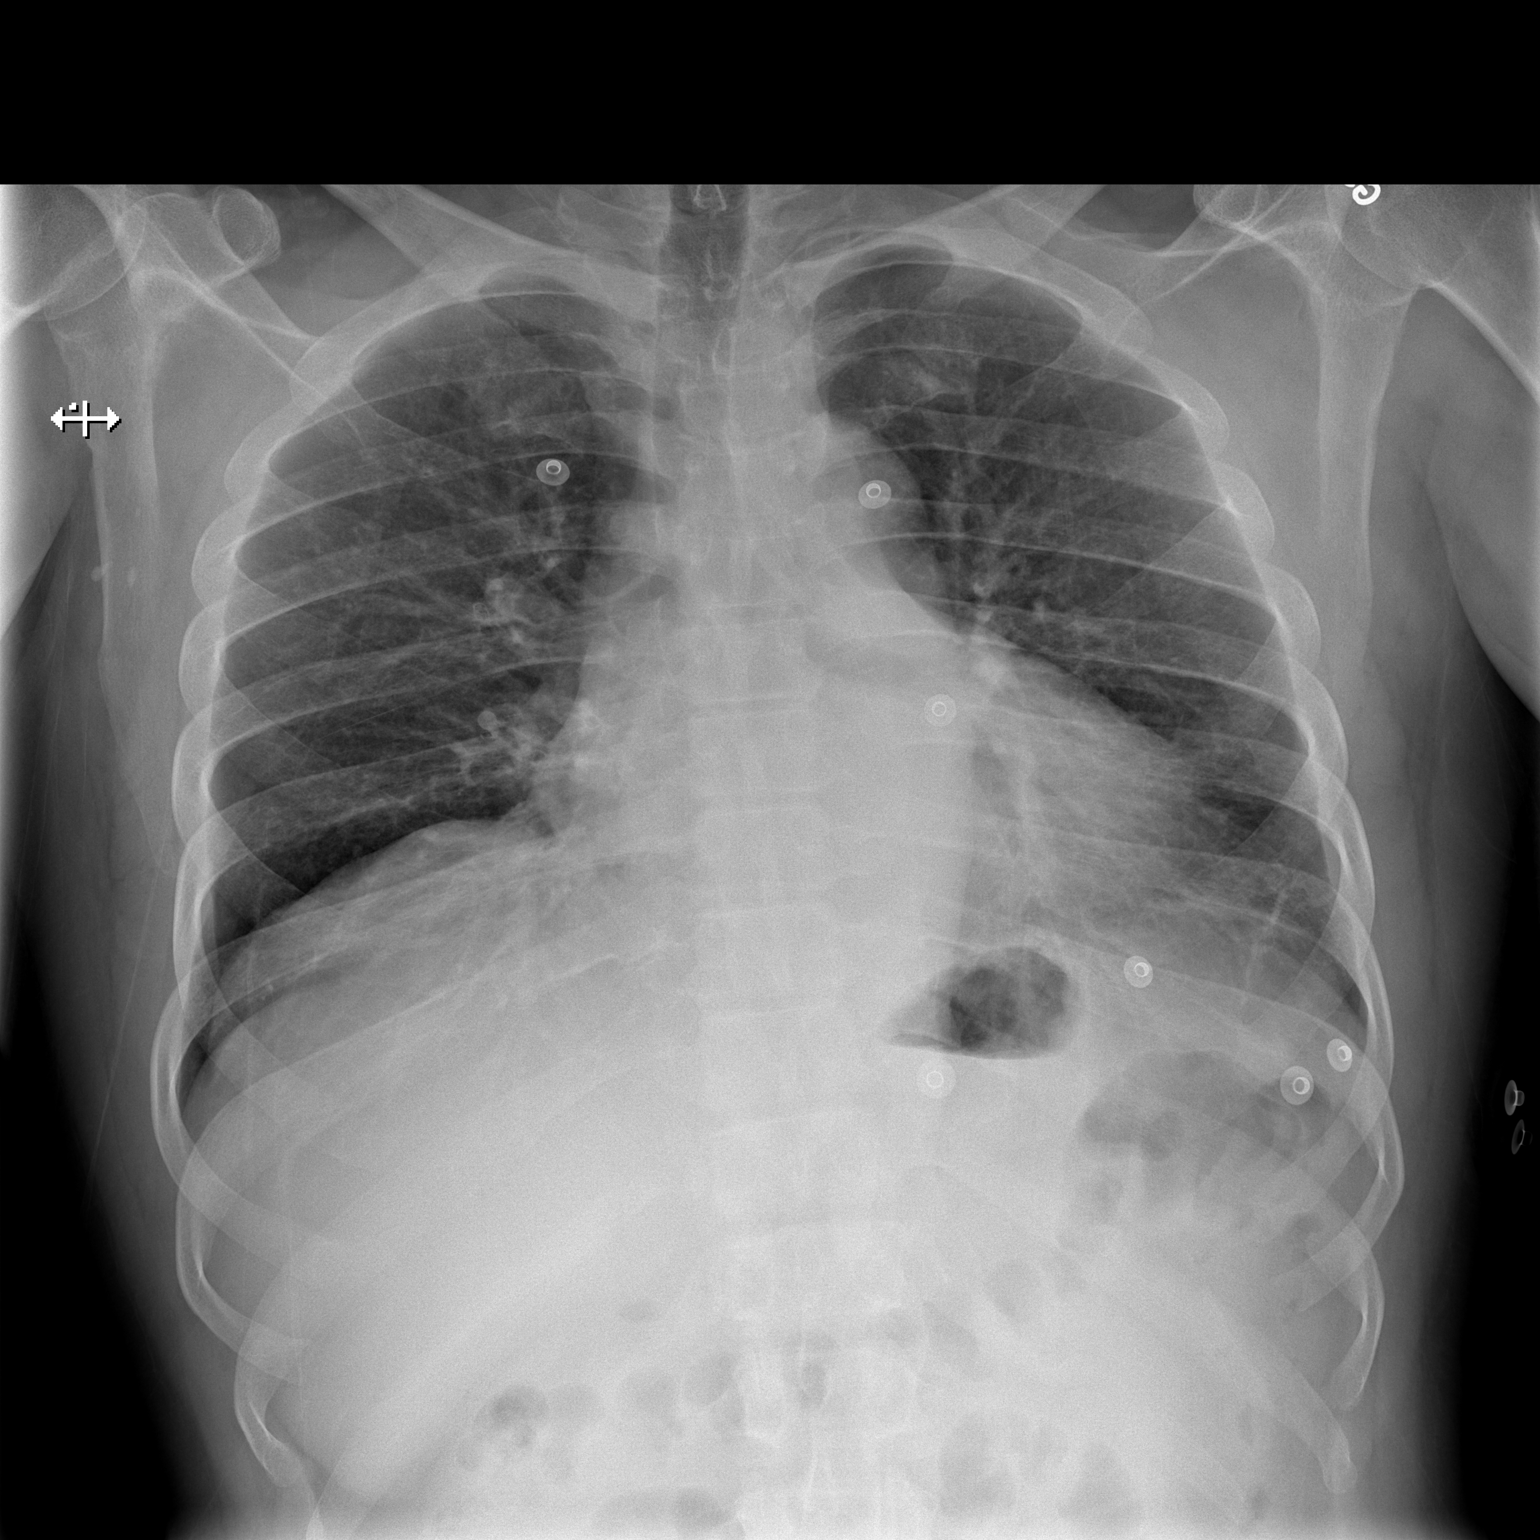

[w chest lat]
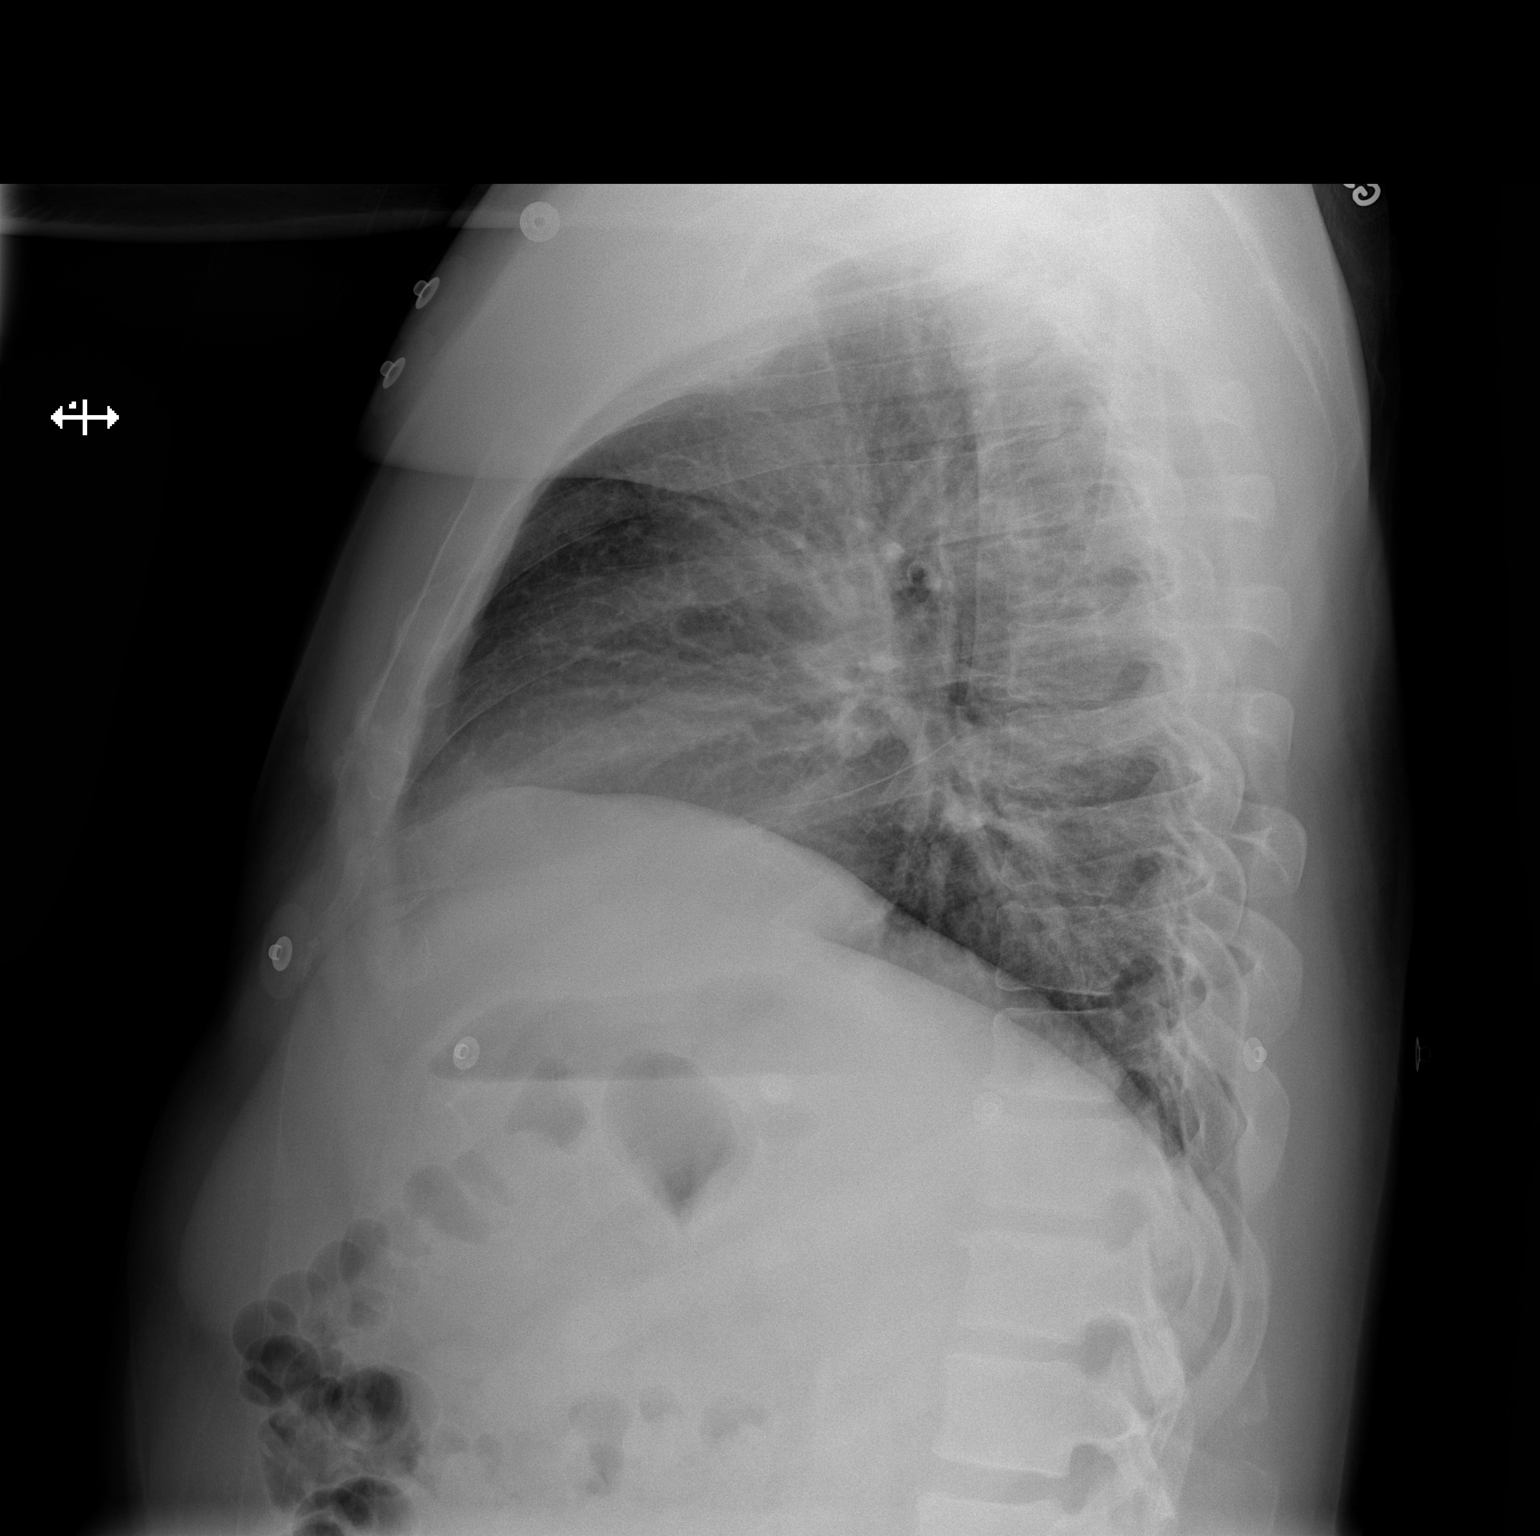

[2 of 2 positions shown; findings below may reference images not displayed]

FINDINGS: Cardiac silhouette appears upper limits of normal, even with
consideration to this low inspiratory examination with crowded
vasculature markings. Linear density in left lung base likely
reflects atelectasis. No pleural effusions or focal consolidations.
Pulmonary vasculature is unremarkable. Trachea projects midline and
there is no pneumothorax. Degenerative changes acromioclavicular
joints.
IMPRESSION: Borderline cardiomegaly, with linear density in left lung base
likely reflecting atelectasis.

  By: SPYRUS

## 2013-10-20 MED ORDER — NICARDIPINE HCL IN NACL 20-0.86 MG/200ML-% IV SOLN
5.0000 mg/h | INTRAVENOUS | Status: DC
  Administered 2013-10-20: 5 mg/h via INTRAVENOUS
  Filled 2013-10-20: qty 200

## 2013-10-20 MED ORDER — OXYCODONE-ACETAMINOPHEN 5-325 MG PO TABS
2.0000 | ORAL_TABLET | Freq: Once | ORAL | Status: AC
  Administered 2013-10-20: 2 via ORAL
  Filled 2013-10-20: qty 2

## 2013-10-20 MED ORDER — ALBUTEROL (5 MG/ML) CONTINUOUS INHALATION SOLN
10.0000 mg/h | INHALATION_SOLUTION | RESPIRATORY_TRACT | Status: AC
  Administered 2013-10-20: 10 mg/h via RESPIRATORY_TRACT

## 2013-10-20 MED ORDER — AZITHROMYCIN 250 MG PO TABS: 250.0000 mg | ORAL_TABLET | Freq: Every day | ORAL | Status: DC

## 2013-10-20 MED ORDER — PREDNISONE 20 MG PO TABS
40.0000 mg | ORAL_TABLET | Freq: Once | ORAL | Status: AC
  Administered 2013-10-20: 40 mg via ORAL
  Filled 2013-10-20: qty 2

## 2013-10-20 MED ORDER — PREDNISONE 20 MG PO TABS: 40.0000 mg | ORAL_TABLET | Freq: Every day | ORAL | Status: DC

## 2013-10-20 MED ORDER — HYDROCODONE-ACETAMINOPHEN 5-325 MG PO TABS: 2.0000 | ORAL_TABLET | ORAL | Status: DC | PRN

## 2013-10-20 NOTE — ED Notes (Signed)
Pt states that he had experienced very severe chest pain during the xray attempt.  Patient states that the pain is gone at the moment

## 2013-10-20 NOTE — ED Notes (Signed)
Dr. Hyacinth Meeker states that patient will be ready for dc if able to ambulate without significant difficulty

## 2013-10-20 NOTE — ED Notes (Signed)
[facility] to take patient home

## 2013-10-20 NOTE — ED Notes (Addendum)
Pt back in room, monitor reapplied.

## 2013-10-20 NOTE — ED Notes (Signed)
Patient transported to X-ray 

## 2013-10-20 NOTE — ED Notes (Signed)
Pt ambulated in hall with no apparent difficulty 

## 2013-10-20 NOTE — ED Notes (Signed)
MD at bedside. 

## 2013-10-20 NOTE — ED Notes (Signed)
Xray called to attempt another xray

## 2013-10-20 NOTE — ED Notes (Signed)
Unable to obtain xray due to patient uncooperative and moving because of pain.

## 2013-10-20 NOTE — ED Notes (Addendum)
Per EMS: pt states 3 days ago gout flared up, 2 days ago difficulty breathing, today on and off chest pain that is in the center, unclear if radiating.  Back pain 9/10  EMS states wheezing and diminished breath sounds, EMS states elevation in v1-v3 but not enough for STEMI  Drugs given by EMS: 5mg  albuterol, 324mg  ASA, 2 NTG (unclear if relief)      162/114 158/98  98% on RA 100% breathign tx

## 2013-10-20 NOTE — ED Provider Notes (Signed)
CSN: [id number]     Arrival date & time 10/20/13  0103 History   First MD Initiated Contact with Patient 10/20/13 0104     Chief Complaint  Patient presents with  . Chest Pain  . Shortness of Breath   (Consider location/radiation/quality/duration/timing/severity/associated sxs/prior Treatment) HPI Comments: According to the medical record the patient is a 52 year old male with a history of frequent gout attacks, frequency OPD exacerbations and some of which require admission to the hospital. He presents with 2 days of shortness of breath associated with a cough, chest pain and productive phlegm. He also complains of wheezing. He received a nebulized treatment by the paramedics on the way to the hospital with some improvement. The gout he pains are present in his bilateral fingers and hands as well as his left knee and are reproducible with movement and palpation, similar to prior gout exacerbations. He denies fevers or chills but states that his cough is gradually getting worse as is his shortness of breath. There's been no swelling in the legs, no rashes, no diarrhea.  Patient is a 52 y.o. male presenting with chest pain and shortness of breath. The history is provided by the patient, the EMS personnel and medical records.  Chest Pain Associated symptoms: shortness of breath   Shortness of Breath Associated symptoms: chest pain     Past Medical History  Diagnosis Date  . Hypertension   . Gout   . Angina pectoris   . COPD (chronic obstructive pulmonary disease)   . Asthma   . Exertional shortness of breath   . Arthritis   . Chronic lower back pain   . Pneumonia     "recently had walking pneumonia" (05/12/2013)  . On home oxygen therapy     "2L 24/7" (05/12/2013)   Past Surgical History  Procedure Laterality Date  . No past surgeries     Family History  Problem Relation Age of Onset  . Diabetes type II Mother   . Depression Father   . Asthma Brother    History  Substance Use  Topics  . Smoking status: Current Every Day Smoker -- 0.50 packs/day for 40 years    Types: Cigarettes  . Smokeless tobacco: Never Used     Comment: 05/12/2013 'eased off smoking since last month"  . Alcohol Use: 42.0 oz/week    70 Cans of beer per week     Comment: 05/12/2013 "usually drink 3, 40oz/day"    Review of Systems  Respiratory: Positive for shortness of breath.   Cardiovascular: Positive for chest pain.  All other systems reviewed and are negative.    Allergies  Review of patient's allergies indicates no known allergies.  Home Medications   Current Outpatient Rx  Name  Route  Sig  Dispense  Refill  . albuterol (PROVENTIL) (5 MG/ML) 0.5% nebulizer solution   Nebulization   Take 2.5 mg by nebulization once.         Marland Kitchen aspirin 81 MG chewable tablet   Oral   Chew 324 mg by mouth once.         Marland Kitchen azithromycin (ZITHROMAX Z-PAK) 250 MG tablet   Oral   Take 1 tablet (250 mg total) by mouth daily. 500mg  PO day 1, then 250mg  PO days 205   6 tablet   0   . nitroGLYCERIN (NITROSTAT) 0.4 MG SL tablet   Sublingual   Place 0.4 mg under the tongue every 5 (five) minutes as needed for chest pain (x2 doses).         Marland Kitchen  predniSONE (DELTASONE) 20 MG tablet   Oral   Take 2 tablets (40 mg total) by mouth daily.   10 tablet   0    BP 140/93  Pulse 83  Temp(Src) 97.5 F (36.4 C) (Axillary)  Resp 17  SpO2 98% Physical Exam  Nursing note and vitals reviewed. Constitutional: He appears well-developed and well-nourished.  HENT:  Head: Normocephalic and atraumatic.  Mouth/Throat: Oropharynx is clear and moist. No oropharyngeal exudate.  Eyes: Conjunctivae and EOM are normal. Pupils are equal, round, and reactive to light. Right eye exhibits no discharge. Left eye exhibits no discharge. No scleral icterus.  Neck: Normal range of motion. Neck supple. No JVD present. No thyromegaly present.  Cardiovascular: Normal rate, regular rhythm, normal heart sounds and intact distal  pulses.  Exam reveals no gallop and no friction rub.   No murmur heard. Pulmonary/Chest: He is in respiratory distress (Increased respiratory rate, speaks in short sentences). He has wheezes (diffuse mild expiratory ). He has rales (bilateral at the bases).  Abdominal: Soft. Bowel sounds are normal. He exhibits no distension and no mass. There is no tenderness.  Musculoskeletal: Normal range of motion. He exhibits no edema and no tenderness.  Lymphadenopathy:    He has no cervical adenopathy.  Neurological: He is alert. Coordination normal.  Skin: Skin is warm and dry. No rash noted. No erythema.  Psychiatric: He has a normal mood and affect. His behavior is normal.    ED Course  Procedures (including critical care time) Labs Review Labs Reviewed  CBC WITH DIFFERENTIAL - Abnormal; Notable for the following:    Hemoglobin 12.8 (*)    HCT 37.8 (*)    RDW 16.5 (*)    Monocytes Absolute 1.2 (*)    All other components within normal limits  PRO B NATRIURETIC PEPTIDE - Abnormal; Notable for the following:    Pro B Natriuretic peptide (BNP) 152.2 (*)    All other components within normal limits  POCT I-STAT, CHEM 8 - Abnormal; Notable for the following:    Glucose, Bld 148 (*)    Calcium, Ion 1.24 (*)    Hemoglobin 12.9 (*)    HCT 38.0 (*)    All other components within normal limits  POCT I-STAT TROPONIN I   Imaging Review Dg Chest 2 View  10/20/2013   CLINICAL DATA:  Shortness of breath.  EXAM: CHEST  2 VIEW  COMPARISON:  Chest radiograph September 27, 2013  FINDINGS: Cardiac silhouette appears upper limits of normal, even with consideration to this low inspiratory examination with crowded vasculature markings. Linear density in left lung base likely reflects atelectasis. No pleural effusions or focal consolidations. Pulmonary vasculature is unremarkable. Trachea projects midline and there is no pneumothorax. Degenerative changes acromioclavicular joints.  IMPRESSION: Borderline  cardiomegaly, with linear density in left lung base likely reflecting atelectasis.   Electronically Signed   By: Awilda Metro   On: 10/20/2013 03:33    EKG Interpretation    Date/Time:  Tuesday October 20 2013 01:10:19 EST Ventricular Rate:  89 PR Interval:  139 QRS Duration: 82 QT Interval:  347 QTC Calculation: 422 R Axis:   53 Text Interpretation:  Sinus rhythm Borderline repol abnormality, diffuse leads since last tracing no significant change            MDM   1. COPD exacerbation   2. Chest pain   3. Gout    The patient does have reproducible tenderness to palpation over the chest wall, he states  that this reproduces his pain, he also states that this pain gets worse with deep breathing and with movement including rotation, movement of the arms and coughing. He has rales at the bases, diffuse wheezing and has an increased respiratory rate which is consistent with an infectious etiology or COPD exacerbation. 2 view chest, cardiac monitoring, supplemental oxygen (patient takes 2 L by nasal cannula at home). We'll also obtain laboratory workup. Pain medication ordered will cover both Goudy flare as well as chest  ECG unchanged  Chest x-ray shows no signs of pneumonia, mild atelectasis, oxygen saturations remained high on room air and 2 L of nasal cannula. After receiving more albuterol and prednisone in the emergency Department he has continued to improve and now has very clear lung sounds, minimal pain and the pain from his acute gouty arthritis has resolved as well. The patient appears stable for discharge.  I doubt a cardiac etiology the patient's chest pain, it is more likely related to his underlying COPD and gout  Meds given in ED:  Medications  albuterol (PROVENTIL,VENTOLIN) solution continuous neb (0 mg/hr Nebulization Stopped 10/20/13 0237)  oxyCODONE-acetaminophen (PERCOCET/ROXICET) 5-325 MG per tablet 2 tablet (2 tablets Oral Given 10/20/13 0208)  predniSONE  (DELTASONE) tablet 40 mg (40 mg Oral Given 10/20/13 0435)    New Prescriptions   AZITHROMYCIN (ZITHROMAX Z-PAK) 250 MG TABLET    Take 1 tablet (250 mg total) by mouth daily. 500mg  PO day 1, then 250mg  PO days 205   PREDNISONE (DELTASONE) 20 MG TABLET    Take 2 tablets (40 mg total) by mouth daily.      Vida Roller, MD 10/20/13 [phone]

## 2013-10-20 NOTE — ED Notes (Signed)
[facility] called  

## 2013-11-17 ENCOUNTER — Encounter (HOSPITAL_COMMUNITY): Payer: Self-pay | Admitting: Emergency Medicine

## 2013-11-17 ENCOUNTER — Emergency Department (HOSPITAL_COMMUNITY): Payer: Medicaid Other

## 2013-11-17 ENCOUNTER — Inpatient Hospital Stay (HOSPITAL_COMMUNITY)
Admission: EM | Admit: 2013-11-17 | Discharge: 2013-11-20 | DRG: 554 | Disposition: A | Discharge status: Home / Self Care | Payer: Medicaid Other | Attending: Internal Medicine | Admitting: Internal Medicine

## 2013-11-17 DIAGNOSIS — R7881 Bacteremia: Secondary | ICD-10-CM | POA: Diagnosis present

## 2013-11-17 DIAGNOSIS — Z794 Long term (current) use of insulin: Secondary | ICD-10-CM

## 2013-11-17 DIAGNOSIS — E871 Hypo-osmolality and hyponatremia: Secondary | ICD-10-CM | POA: Diagnosis present

## 2013-11-17 DIAGNOSIS — J449 Chronic obstructive pulmonary disease, unspecified: Secondary | ICD-10-CM | POA: Diagnosis present

## 2013-11-17 DIAGNOSIS — M545 Low back pain, unspecified: Secondary | ICD-10-CM | POA: Diagnosis present

## 2013-11-17 DIAGNOSIS — I10 Essential (primary) hypertension: Secondary | ICD-10-CM | POA: Diagnosis present

## 2013-11-17 DIAGNOSIS — E860 Dehydration: Secondary | ICD-10-CM | POA: Diagnosis present

## 2013-11-17 DIAGNOSIS — M659 Unspecified synovitis and tenosynovitis, unspecified site: Secondary | ICD-10-CM | POA: Diagnosis present

## 2013-11-17 DIAGNOSIS — E876 Hypokalemia: Secondary | ICD-10-CM | POA: Diagnosis present

## 2013-11-17 DIAGNOSIS — Z79899 Other long term (current) drug therapy: Secondary | ICD-10-CM

## 2013-11-17 DIAGNOSIS — R509 Fever, unspecified: Secondary | ICD-10-CM | POA: Diagnosis present

## 2013-11-17 DIAGNOSIS — M2550 Pain in unspecified joint: Secondary | ICD-10-CM | POA: Diagnosis present

## 2013-11-17 DIAGNOSIS — F17200 Nicotine dependence, unspecified, uncomplicated: Secondary | ICD-10-CM | POA: Diagnosis present

## 2013-11-17 DIAGNOSIS — Z825 Family history of asthma and other chronic lower respiratory diseases: Secondary | ICD-10-CM

## 2013-11-17 DIAGNOSIS — E1142 Type 2 diabetes mellitus with diabetic polyneuropathy: Secondary | ICD-10-CM | POA: Diagnosis present

## 2013-11-17 DIAGNOSIS — J45909 Unspecified asthma, uncomplicated: Secondary | ICD-10-CM

## 2013-11-17 DIAGNOSIS — Z9981 Dependence on supplemental oxygen: Secondary | ICD-10-CM

## 2013-11-17 DIAGNOSIS — G8929 Other chronic pain: Secondary | ICD-10-CM | POA: Diagnosis present

## 2013-11-17 DIAGNOSIS — M130 Polyarthritis, unspecified: Secondary | ICD-10-CM | POA: Diagnosis present

## 2013-11-17 DIAGNOSIS — E119 Type 2 diabetes mellitus without complications: Secondary | ICD-10-CM

## 2013-11-17 DIAGNOSIS — IMO0001 Reserved for inherently not codable concepts without codable children: Secondary | ICD-10-CM | POA: Diagnosis present

## 2013-11-17 DIAGNOSIS — E1165 Type 2 diabetes mellitus with hyperglycemia: Secondary | ICD-10-CM | POA: Diagnosis present

## 2013-11-17 DIAGNOSIS — D649 Anemia, unspecified: Secondary | ICD-10-CM | POA: Diagnosis present

## 2013-11-17 DIAGNOSIS — J4489 Other specified chronic obstructive pulmonary disease: Secondary | ICD-10-CM | POA: Diagnosis present

## 2013-11-17 DIAGNOSIS — M109 Gout, unspecified: Principal | ICD-10-CM | POA: Diagnosis present

## 2013-11-17 DIAGNOSIS — M542 Cervicalgia: Secondary | ICD-10-CM | POA: Diagnosis present

## 2013-11-17 DIAGNOSIS — M436 Torticollis: Secondary | ICD-10-CM | POA: Diagnosis present

## 2013-11-17 DIAGNOSIS — R519 Headache, unspecified: Secondary | ICD-10-CM | POA: Diagnosis present

## 2013-11-17 DIAGNOSIS — R51 Headache: Secondary | ICD-10-CM | POA: Diagnosis present

## 2013-11-17 DIAGNOSIS — IMO0002 Reserved for concepts with insufficient information to code with codable children: Secondary | ICD-10-CM

## 2013-11-17 DIAGNOSIS — Z833 Family history of diabetes mellitus: Secondary | ICD-10-CM

## 2013-11-17 DIAGNOSIS — F1410 Cocaine abuse, uncomplicated: Secondary | ICD-10-CM | POA: Diagnosis present

## 2013-11-17 HISTORY — DX: Type 2 diabetes mellitus without complications: E11.9

## 2013-11-17 LAB — URINALYSIS, ROUTINE W REFLEX MICROSCOPIC
Glucose, UA: NEGATIVE mg/dL
Ketones, ur: NEGATIVE mg/dL
Leukocytes, UA: NEGATIVE
Nitrite: NEGATIVE
Protein, ur: >300 mg/dL — AB
Specific Gravity, Urine: 1.023 (ref 1.005–1.030)
pH: 5.5 (ref 5.0–8.0)

## 2013-11-17 LAB — BASIC METABOLIC PANEL
BUN: 7 mg/dL (ref 6–23)
CO2: 26 mEq/L (ref 19–32)
Calcium: 9.7 mg/dL (ref 8.4–10.5)
Creatinine, Ser: 0.87 mg/dL (ref 0.50–1.35)
GFR calc non Af Amer: >90 mL/min (ref >90)
Potassium: 3.3 mEq/L — ABNORMAL LOW (ref 3.5–5.1)
Sodium: 133 mEq/L — ABNORMAL LOW (ref 135–145)

## 2013-11-17 LAB — CBC WITH DIFFERENTIAL/PLATELET
Basophils Absolute: 0 10*3/uL (ref 0.0–0.1)
Eosinophils Absolute: 0 10*3/uL (ref 0.0–0.7)
Eosinophils Relative: 0 % (ref 0–5)
HCT: 35.1 % — ABNORMAL LOW (ref 39.0–52.0)
MCH: 28.8 pg (ref 26.0–34.0)
MCHC: 32.5 g/dL (ref 30.0–36.0)
MCV: 88.6 fL (ref 78.0–100.0)
Monocytes Absolute: 1.3 10*3/uL — ABNORMAL HIGH (ref 0.1–1.0)
Neutro Abs: 7.8 10*3/uL — ABNORMAL HIGH (ref 1.7–7.7)
Platelets: 448 10*3/uL — ABNORMAL HIGH (ref 150–400)
RDW: 15.1 % (ref 11.5–15.5)

## 2013-11-17 LAB — TROPONIN I: Troponin I: <0.3 ng/mL (ref <0.30)

## 2013-11-17 LAB — URINE MICROSCOPIC-ADD ON

## 2013-11-17 LAB — SEDIMENTATION RATE: Sed Rate: 121 mm/hr — ABNORMAL HIGH (ref 0–16)

## 2013-11-17 IMAGING — CT CT HEAD W/O CM
2 series · 16 of 30 positions shown, 20 images · non-contrast
Comparison: [DATE]

CLINICAL DATA: Headache and neck stiffness.  Weakness.

EXAM:
CT HEAD WITHOUT CONTRAST
TECHNIQUE: Contiguous axial images were obtained from the base of the skull
through the vertex without intravenous contrast.

[Series 2: head w/o · axial · non-contrast · 0.48mm/px · z∈[-378,-248]mm · 13 of 32 slices shown, 17 images]
[im 3/32  brain]
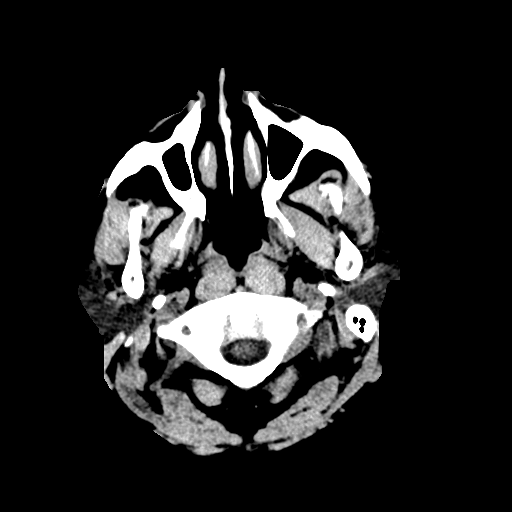
[im 3/32  bone]
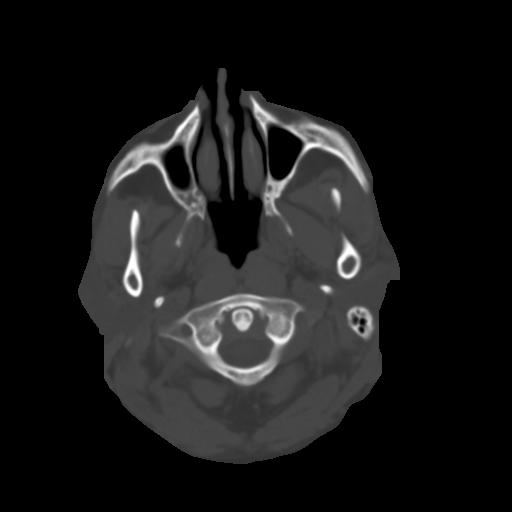
[im 5/32  brain]
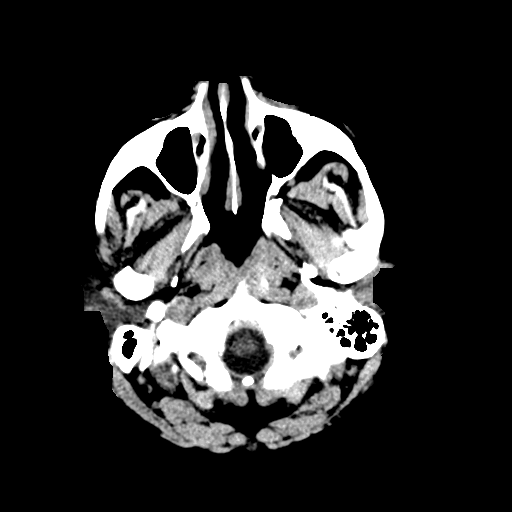
[im 7/32  brain]
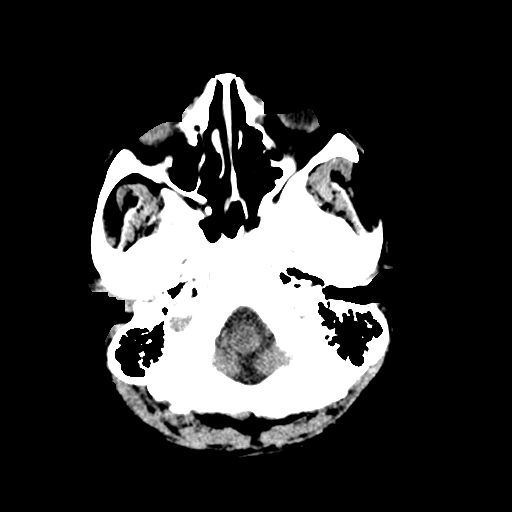
[im 9/32  brain]
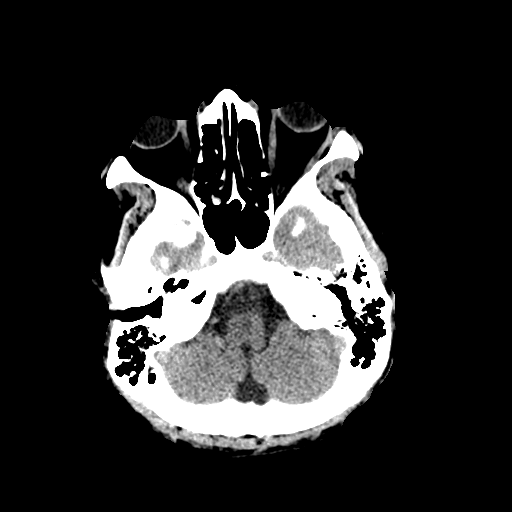
[im 12/32  brain]
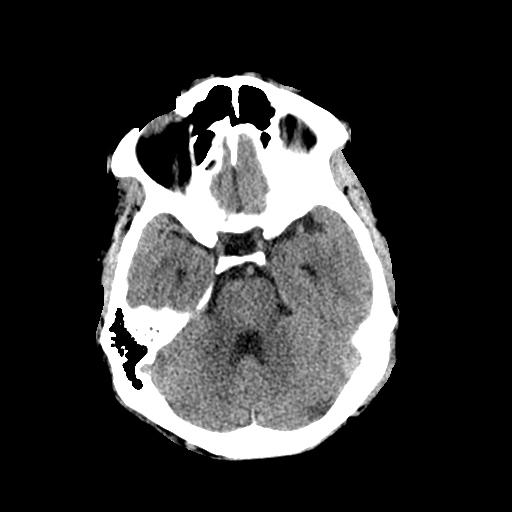
[im 12/32  bone]
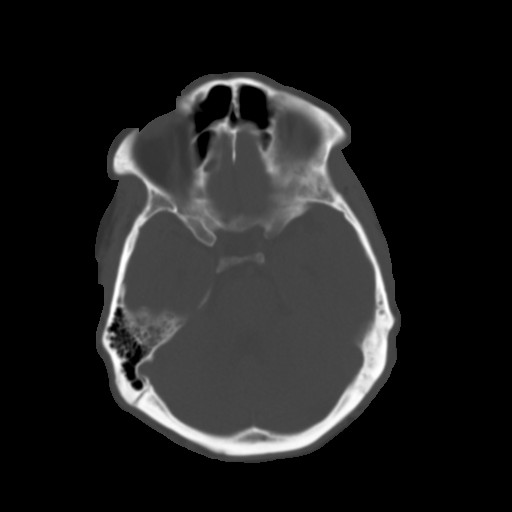
[im 14/32  brain]
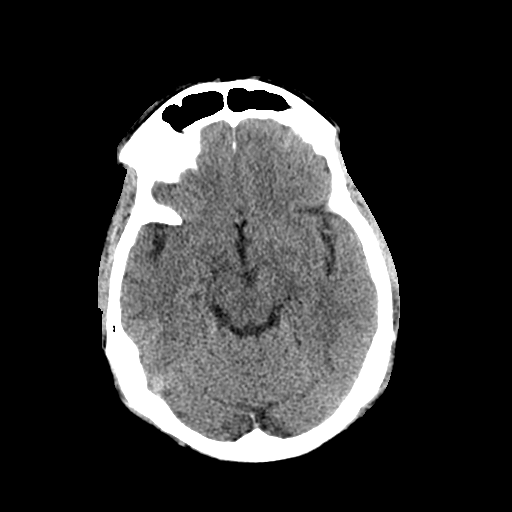
[im 16/32  brain]
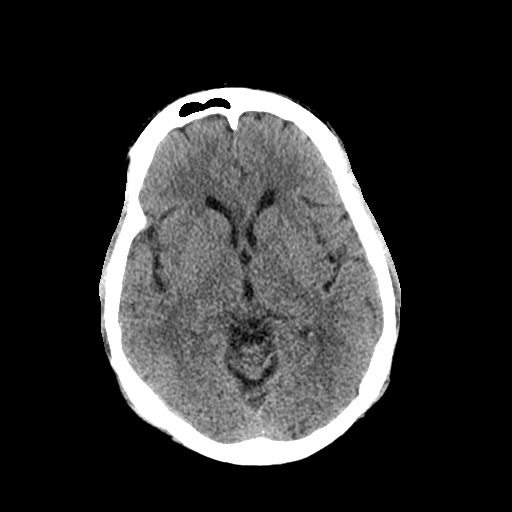
[im 18/32  brain]
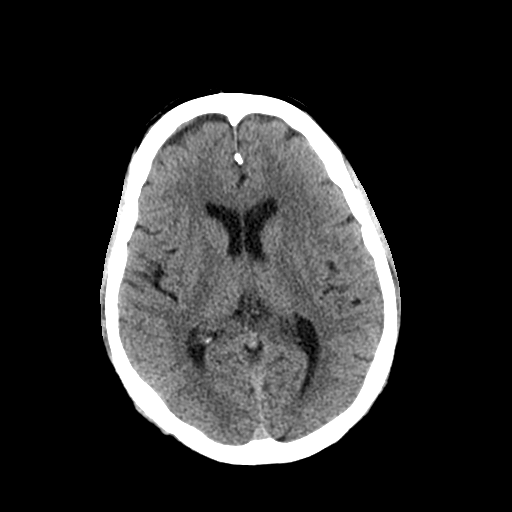
[im 20/32  brain]
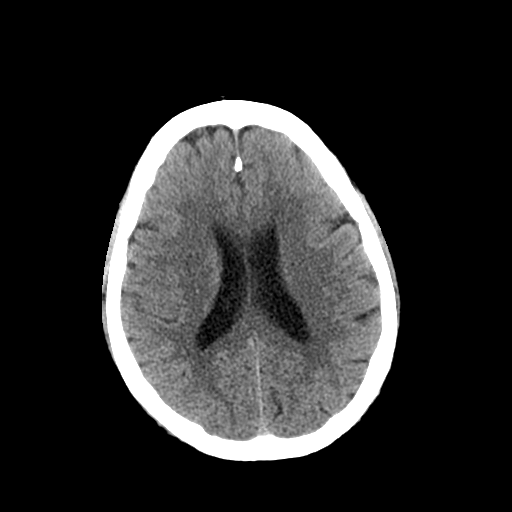
[im 20/32  bone]
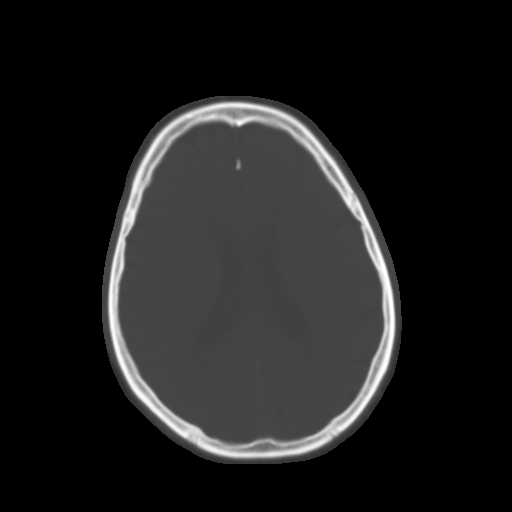
[im 23/32  brain]
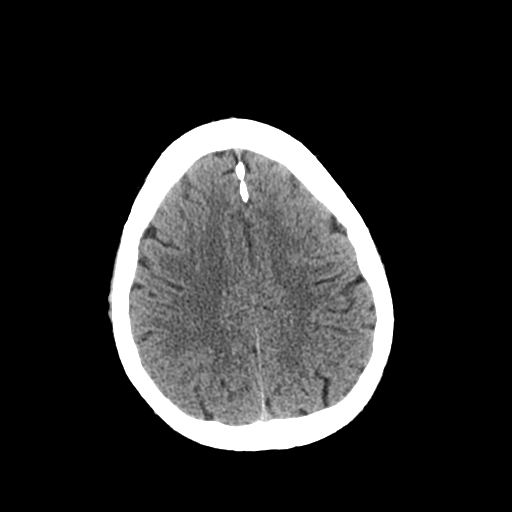
[im 25/32  brain]
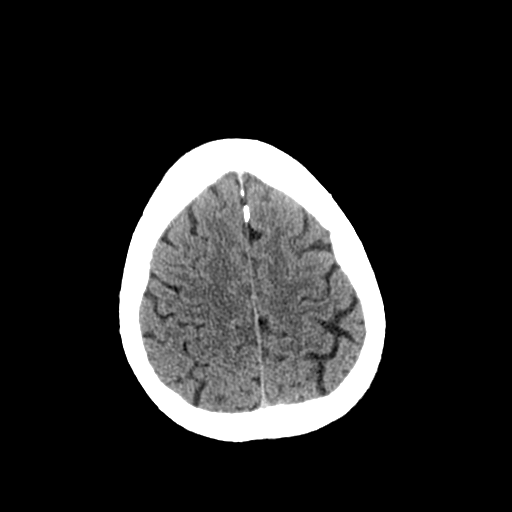
[im 27/32  brain]
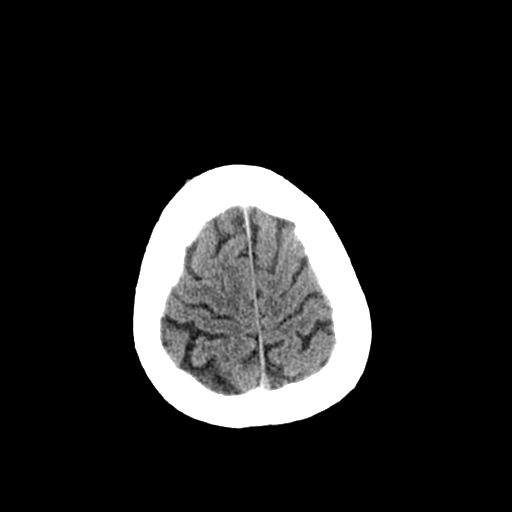
[im 29/32  brain]
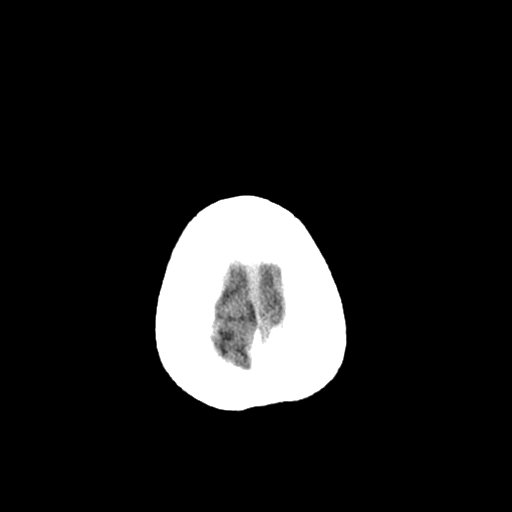
[im 29/32  bone]
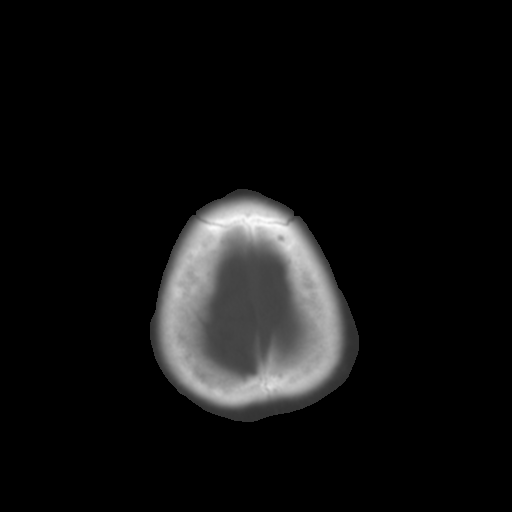

[Series 3: bone windows · axial · 0.48mm/px · z∈[-378,-333]mm · 3 of 32 slices shown]
[im 3/32  bone]
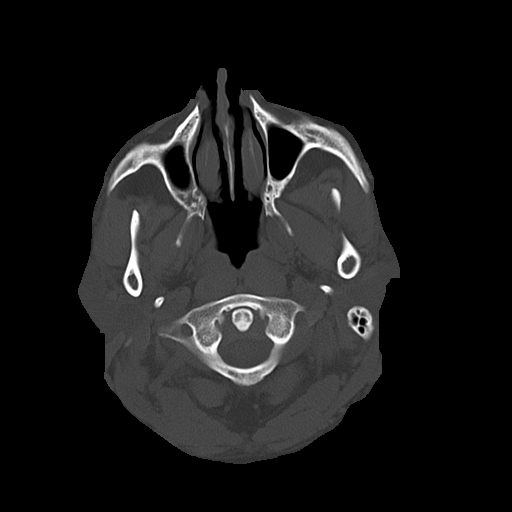
[im 7/32  bone]
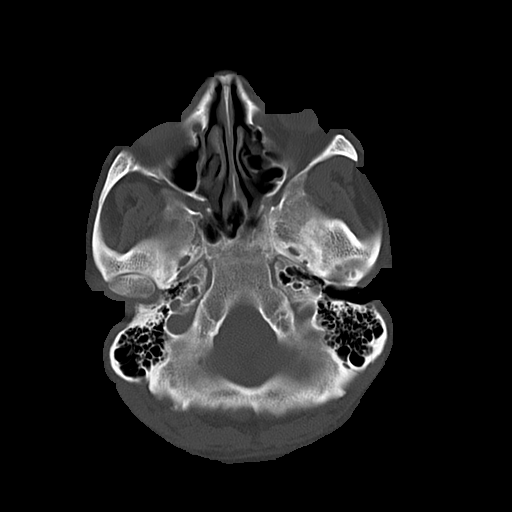
[im 12/32  bone]
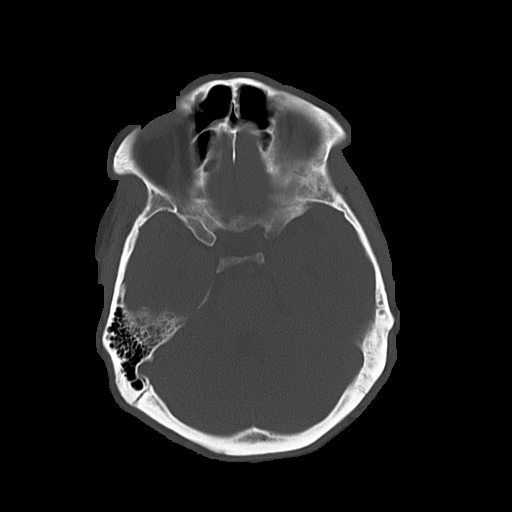

[16 of 30 positions shown; findings below may reference images not displayed]

FINDINGS: The ventricles and sulci are within normal limits for age. There is
no evidence of acute infarct, intracranial hemorrhage, mass, midline
shift, or extra-axial collection. The orbits are unremarkable. The
visualized paranasal sinuses and mastoid air cells are clear. There
is no evidence of acute fracture.
IMPRESSION: Unremarkable head CT.

## 2013-11-17 IMAGING — CR DG CHEST 2V
2 series · 2 of 2 positions shown · non-contrast
Comparison: None.

CLINICAL DATA: Shortness of breath and chest pain

EXAM:
CHEST  2 VIEW

[w chest lat]
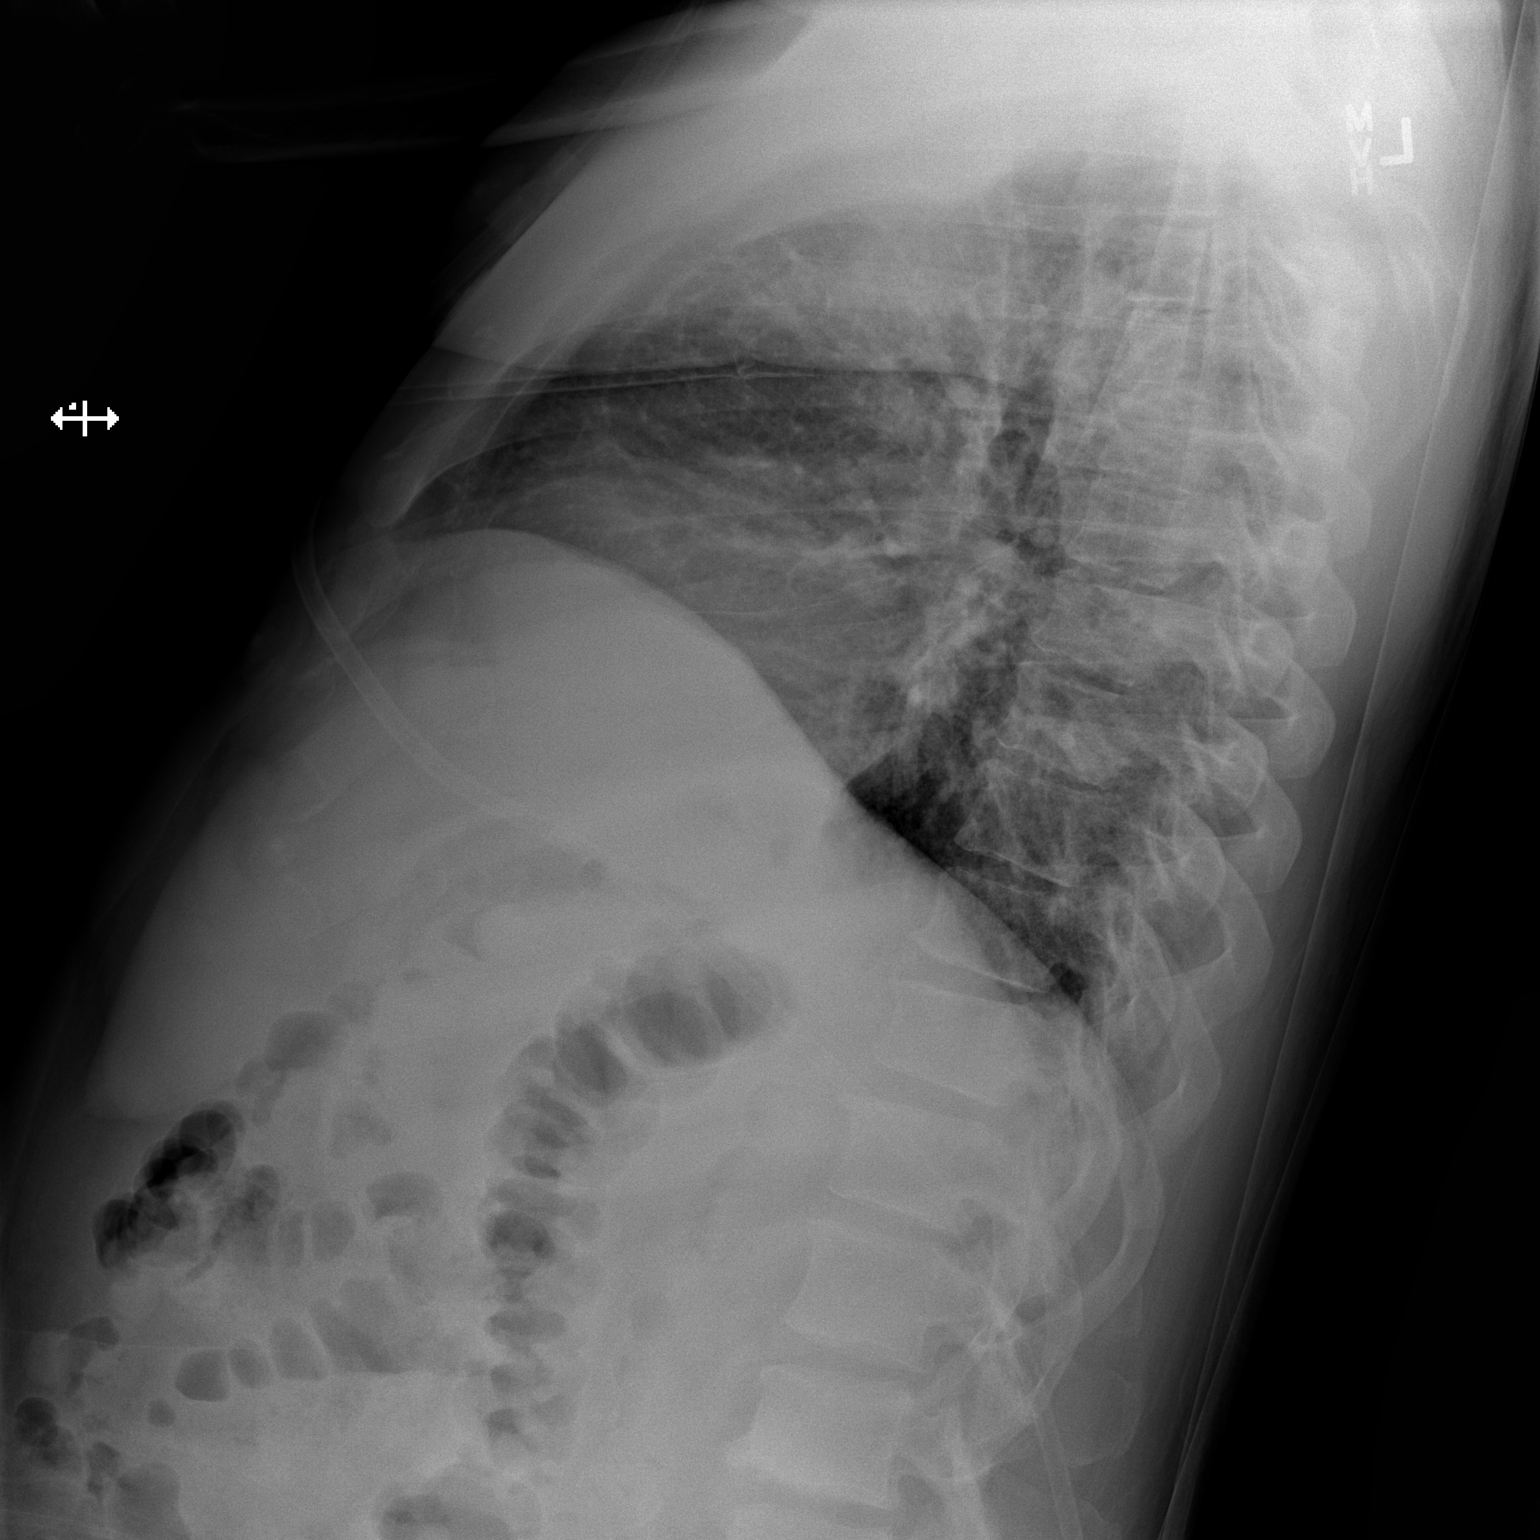

[x chest ap]
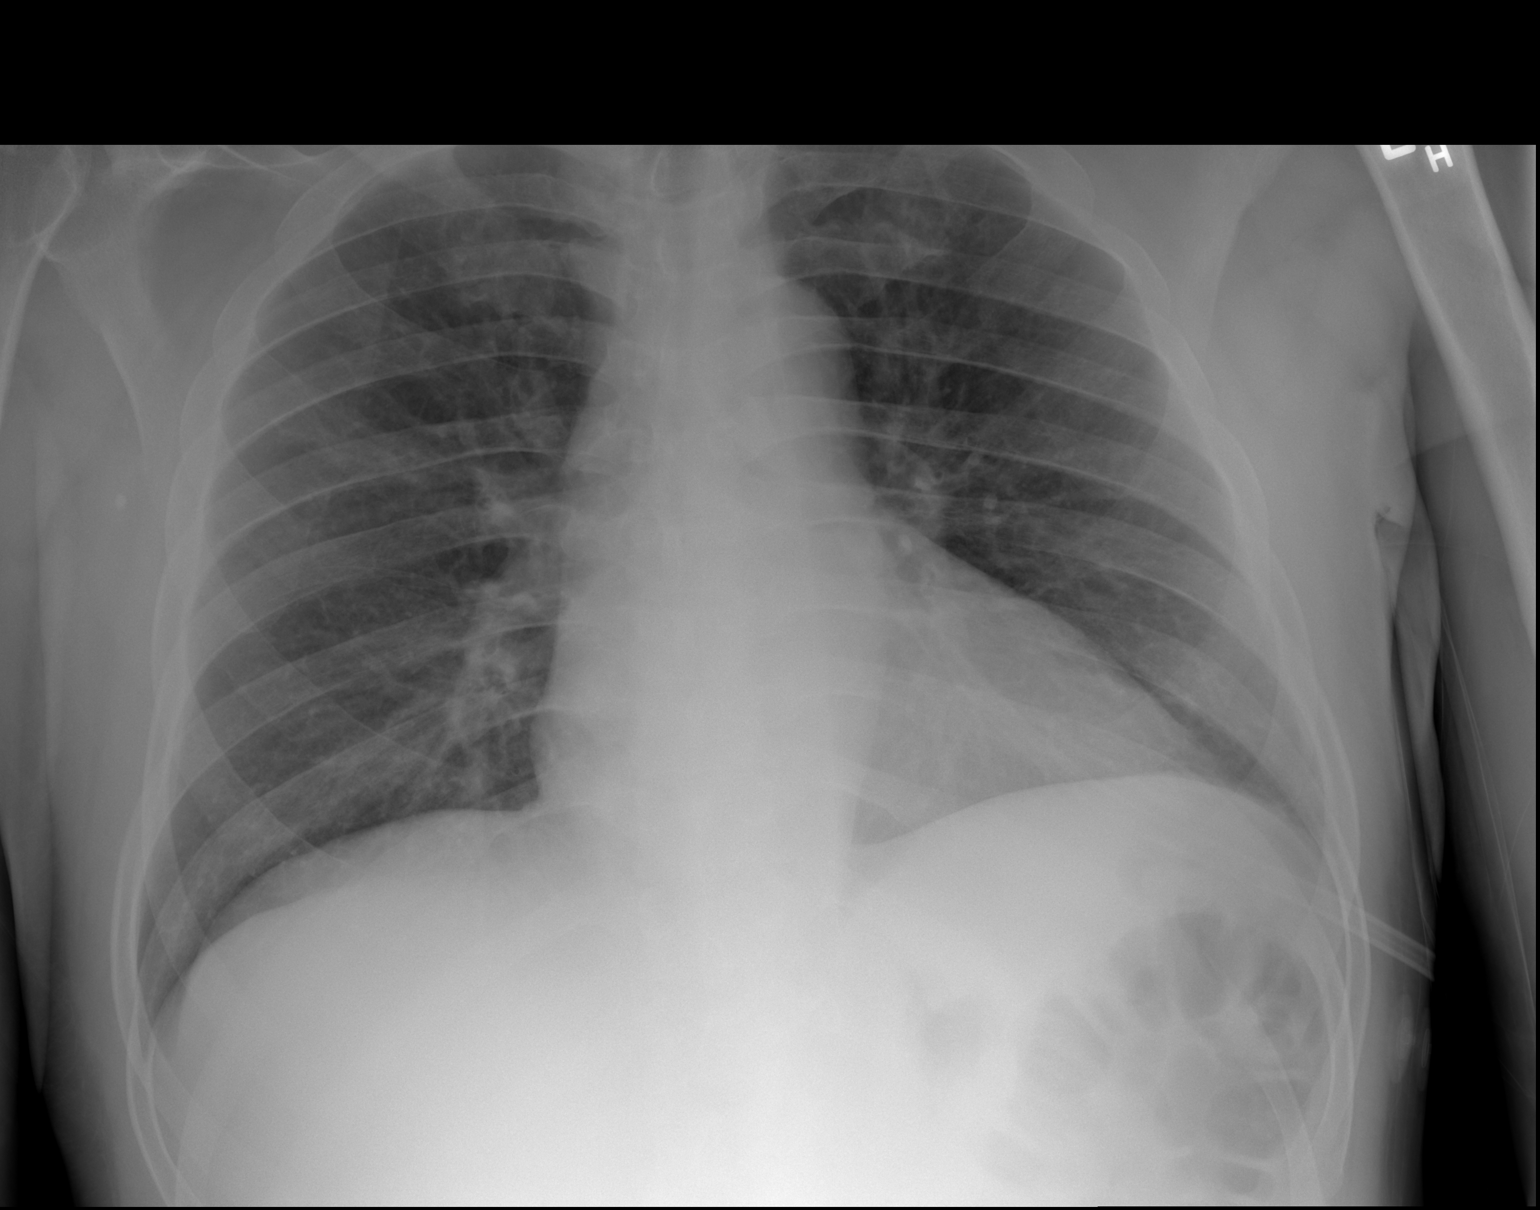

[2 of 2 positions shown; findings below may reference images not displayed]

FINDINGS: Cardiac shadow is stable. Lungs are clear bilaterally. No acute bony
abnormality is seen.
IMPRESSION: No acute abnormality noted.

## 2013-11-17 MED ORDER — ATENOLOL 100 MG PO TABS
100.0000 mg | ORAL_TABLET | Freq: Every morning | ORAL | Status: DC
  Administered 2013-11-18 – 2013-11-20 (×3): 100 mg via ORAL
  Filled 2013-11-17 (×3): qty 1

## 2013-11-17 MED ORDER — SODIUM CHLORIDE 0.9 % IJ SOLN
3.0000 mL | Freq: Two times a day (BID) | INTRAMUSCULAR | Status: DC
  Administered 2013-11-19 – 2013-11-20 (×2): 3 mL via INTRAVENOUS

## 2013-11-17 MED ORDER — DEXTROSE 5 % IV SOLN
2.0000 g | Freq: Once | INTRAVENOUS | Status: AC
  Administered 2013-11-17: 2 g via INTRAVENOUS
  Filled 2013-11-17: qty 2

## 2013-11-17 MED ORDER — FAMOTIDINE 20 MG PO TABS
20.0000 mg | ORAL_TABLET | Freq: Two times a day (BID) | ORAL | Status: DC
  Administered 2013-11-17 – 2013-11-20 (×6): 20 mg via ORAL
  Filled 2013-11-17 (×7): qty 1

## 2013-11-17 MED ORDER — DEXTROSE 5 % IV SOLN
2.0000 g | Freq: Two times a day (BID) | INTRAVENOUS | Status: DC
  Administered 2013-11-18 – 2013-11-20 (×5): 2 g via INTRAVENOUS
  Filled 2013-11-17 (×6): qty 2

## 2013-11-17 MED ORDER — INSULIN ASPART 100 UNIT/ML ~~LOC~~ SOLN
0.0000 [IU] | Freq: Every day | SUBCUTANEOUS | Status: DC
  Administered 2013-11-17: 3 [IU] via SUBCUTANEOUS

## 2013-11-17 MED ORDER — SODIUM CHLORIDE 0.9 % IV BOLUS (SEPSIS)
1000.0000 mL | Freq: Once | INTRAVENOUS | Status: AC
  Administered 2013-11-17: 1000 mL via INTRAVENOUS

## 2013-11-17 MED ORDER — PREDNISONE 5 MG PO TABS
5.0000 mg | ORAL_TABLET | Freq: Every day | ORAL | Status: DC
  Administered 2013-11-18 – 2013-11-20 (×3): 5 mg via ORAL
  Filled 2013-11-17 (×5): qty 1

## 2013-11-17 MED ORDER — HEPARIN SODIUM (PORCINE) 5000 UNIT/ML IJ SOLN
5000.0000 [IU] | Freq: Three times a day (TID) | INTRAMUSCULAR | Status: DC
  Administered 2013-11-17 – 2013-11-20 (×9): 5000 [IU] via SUBCUTANEOUS
  Filled 2013-11-17 (×11): qty 1

## 2013-11-17 MED ORDER — POTASSIUM CHLORIDE IN NACL 40-0.9 MEQ/L-% IV SOLN
INTRAVENOUS | Status: DC
  Administered 2013-11-17 – 2013-11-19 (×7): via INTRAVENOUS
  Filled 2013-11-17 (×13): qty 1000

## 2013-11-17 MED ORDER — NITROGLYCERIN 0.4 MG SL SUBL: 0.4000 mg | SUBLINGUAL_TABLET | SUBLINGUAL | Status: DC | PRN

## 2013-11-17 MED ORDER — POTASSIUM CHLORIDE CRYS ER 20 MEQ PO TBCR
40.0000 meq | EXTENDED_RELEASE_TABLET | Freq: Once | ORAL | Status: AC
  Administered 2013-11-18: 40 meq via ORAL
  Filled 2013-11-17: qty 2

## 2013-11-17 MED ORDER — SIMVASTATIN 20 MG PO TABS
20.0000 mg | ORAL_TABLET | Freq: Every day | ORAL | Status: DC
  Administered 2013-11-18 – 2013-11-19 (×2): 20 mg via ORAL
  Filled 2013-11-17 (×3): qty 1

## 2013-11-17 MED ORDER — VANCOMYCIN HCL IN DEXTROSE 1-5 GM/200ML-% IV SOLN
1000.0000 mg | Freq: Three times a day (TID) | INTRAVENOUS | Status: DC
  Administered 2013-11-18 – 2013-11-20 (×8): 1000 mg via INTRAVENOUS
  Filled 2013-11-17 (×9): qty 200

## 2013-11-17 MED ORDER — PREDNISONE 2.5 MG PO TABS
2.5000 mg | ORAL_TABLET | Freq: Once | ORAL | Status: AC
  Administered 2013-11-17: 2.5 mg via ORAL
  Filled 2013-11-17: qty 1

## 2013-11-17 MED ORDER — ALBUTEROL SULFATE (5 MG/ML) 0.5% IN NEBU: 2.5000 mg | INHALATION_SOLUTION | RESPIRATORY_TRACT | Status: DC | PRN

## 2013-11-17 MED ORDER — AMLODIPINE BESYLATE 2.5 MG PO TABS
2.5000 mg | ORAL_TABLET | Freq: Every morning | ORAL | Status: DC
  Administered 2013-11-18 – 2013-11-20 (×3): 2.5 mg via ORAL
  Filled 2013-11-17 (×3): qty 1

## 2013-11-17 MED ORDER — SODIUM CHLORIDE 0.9 % IV SOLN
2.0000 g | INTRAVENOUS | Status: DC
  Administered 2013-11-17 – 2013-11-18 (×5): 2 g via INTRAVENOUS
  Filled 2013-11-17 (×11): qty 2000

## 2013-11-17 MED ORDER — NAPROXEN 500 MG PO TABS
500.0000 mg | ORAL_TABLET | Freq: Two times a day (BID) | ORAL | Status: DC
  Administered 2013-11-17 – 2013-11-20 (×7): 500 mg via ORAL
  Filled 2013-11-17 (×9): qty 1

## 2013-11-17 MED ORDER — LOSARTAN POTASSIUM 50 MG PO TABS
100.0000 mg | ORAL_TABLET | Freq: Every morning | ORAL | Status: DC
  Administered 2013-11-18 – 2013-11-20 (×3): 100 mg via ORAL
  Filled 2013-11-17 (×3): qty 2

## 2013-11-17 MED ORDER — VANCOMYCIN HCL IN DEXTROSE 1-5 GM/200ML-% IV SOLN
1000.0000 mg | Freq: Once | INTRAVENOUS | Status: AC
  Administered 2013-11-17: 1000 mg via INTRAVENOUS
  Filled 2013-11-17: qty 200

## 2013-11-17 MED ORDER — INSULIN ASPART 100 UNIT/ML ~~LOC~~ SOLN
0.0000 [IU] | Freq: Three times a day (TID) | SUBCUTANEOUS | Status: DC
  Administered 2013-11-18 (×2): 2 [IU] via SUBCUTANEOUS
  Administered 2013-11-18: 1 [IU] via SUBCUTANEOUS
  Administered 2013-11-19: 18:00:00 3 [IU] via SUBCUTANEOUS
  Administered 2013-11-19: 12:00:00 2 [IU] via SUBCUTANEOUS
  Administered 2013-11-19: 1 [IU] via SUBCUTANEOUS
  Administered 2013-11-20: 2 [IU] via SUBCUTANEOUS
  Administered 2013-11-20: 08:00:00 1 [IU] via SUBCUTANEOUS

## 2013-11-17 MED ORDER — ACETAMINOPHEN 500 MG PO TABS
1000.0000 mg | ORAL_TABLET | Freq: Once | ORAL | Status: AC
  Administered 2013-11-17: 1000 mg via ORAL
  Filled 2013-11-17: qty 2

## 2013-11-17 MED ORDER — ONDANSETRON HCL 4 MG PO TABS: 4.0000 mg | ORAL_TABLET | Freq: Four times a day (QID) | ORAL | Status: DC | PRN

## 2013-11-17 MED ORDER — HYDROMORPHONE HCL PF 1 MG/ML IJ SOLN: 1.0000 mg | INTRAMUSCULAR | Status: DC | PRN

## 2013-11-17 MED ORDER — HYDROCODONE-ACETAMINOPHEN 5-325 MG PO TABS
1.0000 | ORAL_TABLET | ORAL | Status: DC | PRN
  Administered 2013-11-18 (×2): 1 via ORAL
  Filled 2013-11-17 (×2): qty 1

## 2013-11-17 MED ORDER — ONDANSETRON HCL 4 MG/2ML IJ SOLN: 4.0000 mg | Freq: Four times a day (QID) | INTRAMUSCULAR | Status: DC | PRN

## 2013-11-17 MED ORDER — HYDROMORPHONE HCL PF 1 MG/ML IJ SOLN
1.0000 mg | Freq: Once | INTRAMUSCULAR | Status: AC
  Administered 2013-11-17: 1 mg via INTRAVENOUS
  Filled 2013-11-17: qty 1

## 2013-11-17 MED ORDER — HYDROMORPHONE HCL PF 1 MG/ML IJ SOLN
1.0000 mg | INTRAMUSCULAR | Status: DC | PRN
  Administered 2013-11-17: 1 mg via INTRAVENOUS
  Filled 2013-11-17: qty 1

## 2013-11-17 MED ORDER — ACETAMINOPHEN 650 MG RE SUPP: 650.0000 mg | Freq: Four times a day (QID) | RECTAL | Status: DC | PRN

## 2013-11-17 MED ORDER — ACETAMINOPHEN 325 MG PO TABS
650.0000 mg | ORAL_TABLET | Freq: Four times a day (QID) | ORAL | Status: DC | PRN
Start: 2013-11-17 — End: 2013-11-20

## 2013-11-17 NOTE — ED Notes (Signed)
Per EMS: pt c/o flu like symptoms x 3 days. C/o pain all over and headache.

## 2013-11-17 NOTE — ED Notes (Signed)
Bed: WA18 Expected date:  Expected time:  Means of arrival:  Comments: EMS-weakness 

## 2013-11-17 NOTE — ED Provider Notes (Signed)
CSN: [id number]     Arrival date & time 11/17/13  1617 History   First MD Initiated Contact with Patient 11/17/13 1636     Chief Complaint  Patient presents with  . Weakness   (Consider location/radiation/quality/duration/timing/severity/associated sxs/prior Treatment) HPI  Past Medical History  Diagnosis Date  . Hypertension   . Gout   . Angina pectoris   . COPD (chronic obstructive pulmonary disease)   . Asthma   . Exertional shortness of breath   . Arthritis   . Chronic lower back pain   . Pneumonia     "recently had walking pneumonia" (05/12/2013)  . On home oxygen therapy     "2L 24/7" (05/12/2013)   Past Surgical History  Procedure Laterality Date  . No past surgeries     Family History  Problem Relation Age of Onset  . Diabetes type II Mother   . Depression Father   . Asthma Brother    History  Substance Use Topics  . Smoking status: Current Every Day Smoker -- 0.50 packs/day for 40 years    Types: Cigarettes  . Smokeless tobacco: Never Used     Comment: 05/12/2013 'eased off smoking since last month"  . Alcohol Use: 42.0 oz/week    70 Cans of beer per week     Comment: 05/12/2013 "usually drink 3, 40oz/day"    Review of Systems  Allergies  Review of patient's allergies indicates no known allergies.  Home Medications   Current Outpatient Rx  Name  Route  Sig  Dispense  Refill  . albuterol (PROVENTIL) (5 MG/ML) 0.5% nebulizer solution   Nebulization   Take 2.5 mg by nebulization every 6 (six) hours as needed for shortness of breath.          Marland Kitchen amLODipine (NORVASC) 2.5 MG tablet   Oral   Take 2.5 mg by mouth every morning.         Marland Kitchen atenolol (TENORMIN) 100 MG tablet   Oral   Take 100 mg by mouth every morning.         Marland Kitchen losartan (COZAAR) 100 MG tablet   Oral   Take 100 mg by mouth every morning.         . metFORMIN (GLUCOPHAGE-XR) 500 MG 24 hr tablet   Oral   Take 500 mg by mouth every evening.         . nitroGLYCERIN (NITROSTAT)  0.4 MG SL tablet   Sublingual   Place 0.4 mg under the tongue every 5 (five) minutes as needed for chest pain (x2 doses).         . pravastatin (PRAVACHOL) 40 MG tablet   Oral   Take 40 mg by mouth every evening.         . predniSONE (DELTASONE) 5 MG tablet   Oral   Take 2.5 mg by mouth daily with breakfast.          BP 178/98  Pulse 84  Temp(Src) 102 F (38.9 C) (Rectal)  Resp 22  SpO2 99% Physical Exam  ED Course  LUMBAR PUNCTURE Date/Time: 11/17/2013 6:50 PM Performed by: Dagmar Hait Authorized by: Dagmar Hait Consent: Verbal consent obtained. written consent obtained. Time out: Immediately prior to procedure a "time out" was called to verify the correct patient, procedure, equipment, support staff and site/side marked as required. Indications: evaluation for infection Anesthesia: local infiltration Local anesthetic: lidocaine 1% with epinephrine Anesthetic total: 10 ml Preparation: Patient was prepped and draped in the usual  sterile fashion. Lumbar space: L3-L4 interspace Patient's position: right lateral decubitus Needle gauge: 20 Needle type: spinal needle - Quincke tip Needle length: 3.5 in Number of attempts: 3 Comments: Unsuccessful LP   (including critical care time) Labs Review Labs Reviewed  CBC WITH DIFFERENTIAL - Abnormal; Notable for the following:    WBC 10.8 (*)    RBC 3.96 (*)    Hemoglobin 11.4 (*)    HCT 35.1 (*)    Platelets 448 (*)    Neutro Abs 7.8 (*)    Monocytes Absolute 1.3 (*)    All other components within normal limits  BASIC METABOLIC PANEL - Abnormal; Notable for the following:    Sodium 133 (*)    Potassium 3.3 (*)    Chloride 94 (*)    Glucose, Bld 167 (*)    All other components within normal limits  CSF CULTURE  GRAM STAIN  URINE CULTURE  CULTURE, BLOOD (ROUTINE X 2)  CULTURE, BLOOD (ROUTINE X 2)  TROPONIN I  CSF CELL COUNT WITH DIFFERENTIAL  PROTEIN, CSF  GLUCOSE, CSF  URINALYSIS,  ROUTINE W REFLEX MICROSCOPIC   Imaging Review Dg Chest 2 View  11/17/2013   CLINICAL DATA:  Shortness of breath and chest pain  EXAM: CHEST  2 VIEW  COMPARISON:  None.  FINDINGS: Cardiac shadow is stable. Lungs are clear bilaterally. No acute bony abnormality is seen.  IMPRESSION: No acute abnormality noted.   Electronically Signed   By: Alcide Clever M.D.   On: 11/17/2013 17:36   Ct Head Wo Contrast  11/17/2013   CLINICAL DATA:  Headache and neck stiffness.  Weakness.  EXAM: CT HEAD WITHOUT CONTRAST  TECHNIQUE: Contiguous axial images were obtained from the base of the skull through the vertex without intravenous contrast.  COMPARISON:  12/29/2012  FINDINGS: The ventricles and sulci are within normal limits for age. There is no evidence of acute infarct, intracranial hemorrhage, mass, midline shift, or extra-axial collection. The orbits are unremarkable. The visualized paranasal sinuses and mastoid air cells are clear. There is no evidence of acute fracture.  IMPRESSION: Unremarkable head CT.   Electronically Signed   By: Sebastian Ache   On: 11/17/2013 18:06    EKG Interpretation    Date/Time:  Tuesday November 17 2013 16:32:30 EST Ventricular Rate:  82 PR Interval:  142 QRS Duration: 88 QT Interval:  372 QTC Calculation: 434 R Axis:   52 Text Interpretation:  Sinus rhythm Ventricular premature complex Aberrant conduction of SV complex(es) Borderline T wave abnormalities Minimal ST elevation, anterior leads No STEMI Confirmed by Gwendolyn Grant  MD, Carlia Bomkamp ([id number]) on 11/17/2013 5:07:55 PM            MDM  No diagnosis found. Procedure note for LP. For H&P please see Rubye Oaks, PA-C's note.    Dagmar Hait, MD 11/17/13 [phone]

## 2013-11-17 NOTE — H&P (Signed)
[facility]  History and Physical  Darryl Hurley:[id number] DOB: 1961/03/09 DOA: 11/17/2013  Referring physician: EDP PCP: No primary provider on file.  Outpatient Specialists:  1. None  Chief Complaint: Multiple joint swellings and pain, headache, neck pain and fevers.  HPI: Darryl Hurley is a 52 y.o. male with history of hypertension, DM 2, COPD-O2 dependent at 2 L per minute continuously and steroid dependent, angina, gout presented to the ED with the above complaints. Patient states that he was in his usual state of health until approximately 2 weeks ago when he first noticed the gradual onset of pain and swelling in the left knee. Over the next 10-12 days, he had gradual onset of similar complaints in the left ankle, left wrist, left elbow, then right wrist and elbow. The pain is excruciating, worse with movements, difficulty performing activities of daily living and ambulating secondary to pain. Over the last 2-3 days, he has noted gradual onset of global headache, throbbing, moderate in intensity, not associated with photophobia, associated with neck pain and difficulty moving neck. He denies earache, sore throat, runny nose, cough, dyspnea, chest pain, nausea, vomiting, abdominal pain, diarrhea, urinary frequency or dysuria. No skin rashes. He denies sickly contacts, recent travel, exposure to animals, birds or pets. He has taken the flu shot this season. He states that his joint pains are similar to prior episodes of gouty arthritis-indicates that many years ago he had a joint aspiration done which confirmed gout. In the ED, MAXIMUM TEMPERATURE 102F, sodium 133, potassium 3.3, glucose 167, WBC 10.8, hemoglobin 11.4, troponin negative, chest x-ray and CT head. EDP attempted LP multiple times but was unsuccessful. IR was consulted by ED for same but patient has since refused LP. Hospitalist admission requested.   Review of Systems: All systems reviewed and apart from history of  presenting illness, are negative.  Past Medical History  Diagnosis Date  . Hypertension   . Gout   . Angina pectoris   . COPD (chronic obstructive pulmonary disease)   . Asthma   . Exertional shortness of breath   . Arthritis   . Chronic lower back pain   . Pneumonia     "recently had walking pneumonia" (05/12/2013)  . On home oxygen therapy     "2L 24/7" (05/12/2013)  . Diabetes mellitus without complication    Past Surgical History  Procedure Laterality Date  . No past surgeries     Social History:  reports that he has been smoking Cigarettes.  He has a 20 pack-year smoking history. He has never used smokeless tobacco. He reports that he drinks about 42.0 ounces of alcohol per week. He reports that he uses illicit drugs ("Crack" cocaine). Patient has a fianc and lives with her. Independent of activities of daily living. He states that he smokes 3-4 cigarettes per day. Denies alcohol or drug abuse. Denies ever using IV drugs.  No Known Allergies  Family History  Problem Relation Age of Onset  . Diabetes type II Mother   . Depression Father   . Asthma Brother     Prior to Admission medications   Medication Sig Start Date End Date Taking? Authorizing Provider  albuterol (PROVENTIL) (5 MG/ML) 0.5% nebulizer solution Take 2.5 mg by nebulization every 6 (six) hours as needed for shortness of breath.    Yes Historical Provider, MD  amLODipine (NORVASC) 2.5 MG tablet Take 2.5 mg by mouth every morning.   Yes Historical Provider, MD  atenolol (TENORMIN) 100 MG tablet  Take 100 mg by mouth every morning.   Yes Historical Provider, MD  losartan (COZAAR) 100 MG tablet Take 100 mg by mouth every morning.   Yes Historical Provider, MD  metFORMIN (GLUCOPHAGE-XR) 500 MG 24 hr tablet Take 500 mg by mouth every evening.   Yes Historical Provider, MD  nitroGLYCERIN (NITROSTAT) 0.4 MG SL tablet Place 0.4 mg under the tongue every 5 (five) minutes as needed for chest pain (x2 doses).   Yes  Historical Provider, MD  pravastatin (PRAVACHOL) 40 MG tablet Take 40 mg by mouth every evening.   Yes Historical Provider, MD  predniSONE (DELTASONE) 5 MG tablet Take 2.5 mg by mouth daily with breakfast.   Yes Historical Provider, MD   Physical Exam: Filed Vitals:   11/17/13 1800 11/17/13 1943 11/17/13 1958 11/17/13 2007  BP: 178/98 152/87 170/95   Pulse: 84 85 84   Temp: 102 F (38.9 C) 99.6 F (37.6 C) 100.3 F (37.9 C)   TempSrc: Rectal Oral Oral   Resp: 22 23 23    Height:    5\' 6"  (1.676 m)  Weight:    85.3 kg (188 lb 0.8 oz)  SpO2: 99% 98% 97%      General exam: Moderately built and nourished male patient, lying uncomfortably supine on the gurney with some painful discomfort.  Head, eyes and ENT: Nontraumatic and normocephalic. Pupils equally reacting to light and accommodation. Oral mucosa dry.  Neck: No JVD, carotid bruit or thyromegaly. Neck stiffness +. No photophobia. Otoscopy exam as per EDP: Unremarkable.  Lymphatics: No lymphadenopathy.  Respiratory system: Clear to auscultation. No increased work of breathing.  Cardiovascular system: S1 and S2 heard, RRR. No JVD, murmurs, gallops, clicks or pedal edema.  Gastrointestinal system: Abdomen is nondistended, soft and nontender. Normal bowel sounds heard. No organomegaly or masses appreciated.  Central nervous system: Alert and oriented. No focal neurological deficits.  Extremities: Peripheral pulses symmetrically felt. Moderate swelling, increased warmth, tenderness and pain full range of movements of multiple joints: Left knee >right elbow >right wrist >left wrist >small joints of left hand. No redness or crepitus. Power assessment Limited secondary to pain  Skin: No rashes or acute findings.  Musculoskeletal system: Negative exam.  Psychiatry: Pleasant and cooperative.   Labs on Admission:  Basic Metabolic Panel:  Recent Labs Lab 11/17/13 1740  NA 133*  K 3.3*  CL 94*  CO2 26  GLUCOSE 167*  BUN 7   CREATININE 0.87  CALCIUM 9.7   Liver Function Tests: No results found for this basename: AST, ALT, ALKPHOS, BILITOT, PROT, ALBUMIN,  in the last 168 hours No results found for this basename: LIPASE, AMYLASE,  in the last 168 hours No results found for this basename: AMMONIA,  in the last 168 hours CBC:  Recent Labs Lab 11/17/13 1740  WBC 10.8*  NEUTROABS 7.8*  HGB 11.4*  HCT 35.1*  MCV 88.6  PLT 448*   Cardiac Enzymes:  Recent Labs Lab 11/17/13 1740  TROPONINI <0.30    BNP (last 3 results)  Recent Labs  01/19/13 1604 08/09/13 0037 10/20/13 0129  PROBNP 365.2* 106.8 152.2*   CBG: No results found for this basename: GLUCAP,  in the last 168 hours  Radiological Exams on Admission: Dg Chest 2 View  11/17/2013   CLINICAL DATA:  Shortness of breath and chest pain  EXAM: CHEST  2 VIEW  COMPARISON:  None.  FINDINGS: Cardiac shadow is stable. Lungs are clear bilaterally. No acute bony abnormality is seen.  IMPRESSION: No  acute abnormality noted.   Electronically Signed   By: Alcide Clever M.D.   On: 11/17/2013 17:36   Ct Head Wo Contrast  11/17/2013   CLINICAL DATA:  Headache and neck stiffness.  Weakness.  EXAM: CT HEAD WITHOUT CONTRAST  TECHNIQUE: Contiguous axial images were obtained from the base of the skull through the vertex without intravenous contrast.  COMPARISON:  12/29/2012  FINDINGS: The ventricles and sulci are within normal limits for age. There is no evidence of acute infarct, intracranial hemorrhage, mass, midline shift, or extra-axial collection. The orbits are unremarkable. The visualized paranasal sinuses and mastoid air cells are clear. There is no evidence of acute fracture.  IMPRESSION: Unremarkable head CT.   Electronically Signed   By: Sebastian Ache   On: 11/17/2013 18:06    EKG: Independently reviewed. Sinus rhythm. Baseline artifacts in inferior leads. No acute changes.  Assessment/Plan Principal Problem:   Fever Active Problems:   TOBACCO  ABUSE   Severe uncontrolled hypertension   Chronic obstructive pulmonary disease (COPD)   Gout   Headache   Type 2 diabetes mellitus   Anemia   Hypokalemia   Polyarthritis   Dehydration   Febrile illness, acute   Febrile illness associated with polyarthritis (mainly large joint) & headache and neck stiffness. - Unclear etiology. His arthritis may be secondary to acute gouty flare, however does not explain headache and neck stiffness. Need to rule out meningitis. - Discussed extensively with the patient in the presence of his fiance, the importance of doing an LP to rule out meningitis but patient declines at this time and states that he wants to think some more. - Urine microscopy and chest x-ray unremarkable. No significant leukocytosis. - Admit to telemetry. Placed on droplet isolation until influenza panel negative and meningitis ruled out. - Follow blood cultures x2, urine culture, influenza panel PCR, ESR, CRP, HIV antibody, HIV RNA - Aggressive IV fluid hydration. - Continue empiric IV antibiotics to cover for possible bacterial meningitis: High dose IV Rocephin, IV vancomycin & ampicillin - Infectious disease consulted-discussed with Dr. Ninetta Lights who agrees with above and we'll formally consult.  Possible acute gouty arthritis - Treat with oral Naprosyn and Pepcid for GI protection. - Monitor closely.  Dehydration with hyponatremia - IV NS hydration  Hypokalemia - Replete and follow  Hypertension - Mildly uncontrolled. Probably precipitated by pain - Pain control and continue home antihypertensives.  Uncontrolled type II DM - Hold metformin. NovoLog SSI.  COPD-oxygen and steroid dependent - Stable. Continue oxygen. Will double the dose of prednisone to cover for stressful situation. Patient hemodynamically stable.  History of angina - Asymptomatic of chest pain. EKG and troponin unremarkable.     Code Status: Full  Family Communication: Discussed with fiance  at bedside.  Disposition Plan: Admit to telemetry. Home when medically stable.   Time spent: 70 minutes  Gevork Ayyad, MD, FACP, FHM. [facility] Pager [phone]  If 7PM-7AM, please contact night-coverage [email or web address] Password [facility] 11/17/2013, 8:18 PM

## 2013-11-17 NOTE — ED Notes (Signed)
Hospitalist at bedside at this time 

## 2013-11-17 NOTE — Progress Notes (Signed)
ANTIBIOTIC CONSULT NOTE - INITIAL  Pharmacy Consult for Vancomycin & Ampicillin Indication: R/O meningitis  No Known Allergies  Patient Measurements: Height: 5\' 6"  (167.6 cm) Weight: 188 lb 0.8 oz (85.3 kg) IBW/kg (Calculated) : 63.8  Vital Signs: Temp: 100.3 F (37.9 C) (12/16 1958) Temp src: Oral (12/16 1958) BP: 170/95 mmHg (12/16 1958) Pulse Rate: 84 (12/16 1958) Intake/Output from previous day:   Intake/Output from this shift:    Labs:  Recent Labs  11/17/13 1740  WBC 10.8*  HGB 11.4*  PLT 448*  CREATININE 0.87   Estimated Creatinine Clearance: 102.9 ml/min (by C-G formula based on Cr of 0.87). No results found for this basename: VANCOTROUGH, VANCOPEAK, VANCORANDOM, GENTTROUGH, GENTPEAK, GENTRANDOM, TOBRATROUGH, TOBRAPEAK, TOBRARND, AMIKACINPEAK, AMIKACINTROU, AMIKACIN,  in the last 72 hours   Microbiology: No results found for this or any previous visit (from the past 720 hour(s)).  Medical History: Past Medical History  Diagnosis Date  . Hypertension   . Gout   . Angina pectoris   . COPD (chronic obstructive pulmonary disease)   . Asthma   . Exertional shortness of breath   . Arthritis   . Chronic lower back pain   . Pneumonia     "recently had walking pneumonia" (05/12/2013)  . On home oxygen therapy     "2L 24/7" (05/12/2013)  . Diabetes mellitus without complication     Medications:  Scheduled:  . famotidine  20 mg Oral BID  . naproxen  500 mg Oral BID WC  . potassium chloride  40 mEq Oral Once  . predniSONE  2.5 mg Oral Once   Infusions:  . ampicillin (OMNIPEN) IV    . [START ON 11/18/2013] cefTRIAXone (ROCEPHIN)  IV    . vancomycin 1,000 mg (11/17/13 1954)  . [START ON 11/18/2013] vancomycin     Assessment:  52 yr male with chief complaint of flu symptoms x 3 days along with pain and headache  Blood, urine, CSF cultures ordered  Vancomycin 1gm x 1 ordered and given @ 19:54; Rocephin 2gm IV q12h initiated  Upon admission,  Vancomycin and Ampicillin ordered per pharmacy dosing along with Rocephin per MD  Goal of Therapy:  Vancomycin trough level 15-20 mcg/ml  Plan:  Measure antibiotic drug levels at steady state Follow up culture results Vancomycin 1gm IV q8h Ampicillin 2gm IV q4h  Emmery Seiler, Joselyn Glassman, PharmD 11/17/2013,8:17 PM

## 2013-11-17 NOTE — ED Provider Notes (Signed)
CSN: [id number]     Arrival date & time 11/17/13  1617 History   None    Chief Complaint  Patient presents with  . Weakness    HPI  BENJIMIN Hurley is a 52 y.o. male with a PMH of HTN, gout, angina pectoris, COPD on home O2, asthma, SOB, arthritis, chronic lower back pain, pneumonia who presents to the ED for evaluation of weakness.  History was provided by the patient.  Patient states he has had gout pain in his elbows, left hand, left knee, and left ankle for the past two weeks.  His gout pain is "so bad he cannot walk or feed himself" (per fiance).  He is not on any medications for his gout.  He also has been feeling sick for the past 3 days.  He complains of a throbbing constant generalized headache for the past two days.  He also reports neck pain and "cannot move his neck."  He denies any vision changes, focal weakness, or loss of sensation.  He denies any chest pain or SOB.  He has a chronic cough but it has not been productive.  No increase in his oxygen requirements.  He has been having night sweats and feeling warm with a subjective fever.  No abdominal pain, emesis, diarrhea, constipation, dysuria, or hematuria.  No known sick contacts.  No recent travel.       Past Medical History  Diagnosis Date  . Hypertension   . Gout   . Angina pectoris   . COPD (chronic obstructive pulmonary disease)   . Asthma   . Exertional shortness of breath   . Arthritis   . Chronic lower back pain   . Pneumonia     "recently had walking pneumonia" (05/12/2013)  . On home oxygen therapy     "2L 24/7" (05/12/2013)   Past Surgical History  Procedure Laterality Date  . No past surgeries     Family History  Problem Relation Age of Onset  . Diabetes type II Mother   . Depression Father   . Asthma Brother    History  Substance Use Topics  . Smoking status: Current Every Day Smoker -- 0.50 packs/day for 40 years    Types: Cigarettes  . Smokeless tobacco: Never Used     Comment: 05/12/2013 'eased  off smoking since last month"  . Alcohol Use: 42.0 oz/week    70 Cans of beer per week     Comment: 05/12/2013 "usually drink 3, 40oz/day"    Review of Systems  Constitutional: Positive for fever, chills, diaphoresis and fatigue. Negative for activity change and appetite change.  HENT: Negative for congestion, ear pain, rhinorrhea, sinus pressure, sore throat and trouble swallowing.   Eyes: Negative for photophobia, pain and visual disturbance.  Respiratory: Positive for cough (chronic) and shortness of breath (at baseline). Negative for wheezing.   Cardiovascular: Negative for chest pain and leg swelling.  Gastrointestinal: Negative for nausea, vomiting, abdominal pain, diarrhea and constipation.  Genitourinary: Negative for dysuria and penile pain.  Musculoskeletal: Positive for arthralgias, back pain, joint swelling (gout), myalgias, neck pain and neck stiffness.  Skin: Negative for color change.  Neurological: Positive for weakness (generalized) and headaches. Negative for dizziness, syncope, light-headedness and numbness.  Psychiatric/Behavioral: Negative for confusion.    Allergies  Review of patient's allergies indicates no known allergies.  Home Medications   Current Outpatient Rx  Name  Route  Sig  Dispense  Refill  . albuterol (PROVENTIL) (5 MG/ML) 0.5% nebulizer  solution   Nebulization   Take 2.5 mg by nebulization once.         Marland Kitchen aspirin 81 MG chewable tablet   Oral   Chew 324 mg by mouth once.         Marland Kitchen azithromycin (ZITHROMAX Z-PAK) 250 MG tablet   Oral   Take 1 tablet (250 mg total) by mouth daily. 500mg  PO day 1, then 250mg  PO days 205   6 tablet   0   . HYDROcodone-acetaminophen (NORCO/VICODIN) 5-325 MG per tablet   Oral   Take 2 tablets by mouth every 4 (four) hours as needed.   10 tablet   0   . nitroGLYCERIN (NITROSTAT) 0.4 MG SL tablet   Sublingual   Place 0.4 mg under the tongue every 5 (five) minutes as needed for chest pain (x2 doses).          . predniSONE (DELTASONE) 20 MG tablet   Oral   Take 2 tablets (40 mg total) by mouth daily.   10 tablet   0    BP 171/11  Pulse 84  Temp(Src) 99.8 F (37.7 C) (Oral)  Resp 22  SpO2 100%  Filed Vitals:   11/17/13 1628  BP: 171/111  Pulse: 84  Temp: 99.8 F (37.7 C)  TempSrc: Oral  Resp: 22  SpO2: 100%    Physical Exam  Nursing note and vitals reviewed. Constitutional: He is oriented to person, place, and time. He appears well-developed and well-nourished. No distress.  HENT:  Head: Normocephalic and atraumatic.  Right Ear: External ear normal.  Left Ear: External ear normal.  Nose: Nose normal.  Mouth/Throat: Oropharynx is clear and moist. No oropharyngeal exudate.  Tympanic membranes gray and translucent bilaterally  Eyes: Conjunctivae are normal. Pupils are equal, round, and reactive to light. Right eye exhibits no discharge. Left eye exhibits no discharge.  Neck: Normal range of motion. Neck supple.  Patient will not move head.  Tenderness to palpation to the posterior neck throughout.    Cardiovascular: Normal rate, regular rhythm and intact distal pulses.  Exam reveals no gallop and no friction rub.   Murmur heard. Pulmonary/Chest: Effort normal and breath sounds normal. No respiratory distress. He has no wheezes. He has no rales. He exhibits no tenderness.  Crackles heard in the lung bases bilaterally  Abdominal: Soft. Bowel sounds are normal. He exhibits no distension and no mass. There is no tenderness. There is no rebound and no guarding.  Musculoskeletal: Normal range of motion. He exhibits no edema and no tenderness.  Patient moving all extremities throughout exam.  Unable to determine strength in the extremities due to pain.  Mild left knee edema with no erythema or warmth to the touch.    Neurological: He is alert and oriented to person, place, and time. No cranial nerve deficit.  GCS 15.  No focal neurological deficits.  CN 2-12 intact.  No pronator  drift.   Skin: Skin is warm and dry. He is not diaphoretic.  Warm to the touch    ED Course  Procedures (including critical care time) Labs Review Labs Reviewed - No data to display Imaging Review No results found.  EKG Interpretation    Date/Time:  Tuesday November 17 2013 16:32:30 EST Ventricular Rate:  82 PR Interval:  142 QRS Duration: 88 QT Interval:  372 QTC Calculation: 434 R Axis:   52 Text Interpretation:  Sinus rhythm Ventricular premature complex Aberrant conduction of SV complex(es) Borderline T wave abnormalities Minimal ST  elevation, anterior leads No STEMI Confirmed by Gwendolyn Grant  MD, BLAIR ([id number]) on 11/17/2013 5:07:55 PM                CT Head Wo Contrast (Final result)  Result time: 11/17/13 18:06:54    Final result by Rad Results In Interface (11/17/13 18:06:54)    Narrative:   CLINICAL DATA: Headache and neck stiffness. Weakness.  EXAM: CT HEAD WITHOUT CONTRAST  TECHNIQUE: Contiguous axial images were obtained from the base of the skull through the vertex without intravenous contrast.  COMPARISON: 12/29/2012  FINDINGS: The ventricles and sulci are within normal limits for age. There is no evidence of acute infarct, intracranial hemorrhage, mass, midline shift, or extra-axial collection. The orbits are unremarkable. The visualized paranasal sinuses and mastoid air cells are clear. There is no evidence of acute fracture.  IMPRESSION: Unremarkable head CT.   Electronically Signed By: Sebastian Ache On: 11/17/2013 18:06             DG Chest 2 View (Final result)  Result time: 11/17/13 17:36:33    Final result by Rad Results In Interface (11/17/13 17:36:33)    Narrative:   CLINICAL DATA: Shortness of breath and chest pain  EXAM: CHEST 2 VIEW  COMPARISON: None.  FINDINGS: Cardiac shadow is stable. Lungs are clear bilaterally. No acute bony abnormality is seen.  IMPRESSION: No acute abnormality noted.   Electronically  Signed By: Alcide Clever M.D. On: 11/17/2013 17:36        Results for orders placed during the hospital encounter of 11/17/13  URINE CULTURE      Result Value Range   Specimen Description URINE, CLEAN CATCH     Special Requests NONE     Culture  Setup Time       Value: 11/18/2013 01:26     Performed at [facility] Count       Value: NO GROWTH     Performed at [facility]   Culture       Value: NO GROWTH     Performed at [facility]   Report Status 11/18/2013 FINAL    CULTURE, BLOOD (ROUTINE X 2)      Result Value Range   Specimen Description BLOOD RIGHT HAND     Special Requests BOTTLES DRAWN AEROBIC AND ANAEROBIC 3CC     Culture  Setup Time       Value: 11/17/2013 22:18     Performed at [facility]   Culture       Value:        BLOOD CULTURE RECEIVED NO GROWTH TO DATE CULTURE WILL BE HELD FOR 5 DAYS BEFORE ISSUING A FINAL NEGATIVE REPORT     Performed at [facility]   Report Status PENDING    CULTURE, BLOOD (ROUTINE X 2)      Result Value Range   Specimen Description BLOOD LEFT ARM     Special Requests BOTTLES DRAWN AEROBIC AND ANAEROBIC 5CC     Culture  Setup Time       Value: 11/17/2013 22:18     Performed at [facility]   Culture       Value:        BLOOD CULTURE RECEIVED NO GROWTH TO DATE CULTURE WILL BE HELD FOR 5 DAYS BEFORE ISSUING A FINAL NEGATIVE REPORT     Performed at [facility]   Report Status PENDING    BODY FLUID CULTURE  Result Value Range   Specimen Description KNEE LEFT     Special Requests NONE     Gram Stain       Value: CYTOSPIN WBC PRESENT, PREDOMINANTLY PMN     NO ORGANISMS SEEN     GSRC ZAPPIA J AT 1724 ON 11/18/13 BY POTEAT S     Performed at [facility]   Culture       Value: NO GROWTH     Performed at [facility]   Report Status PENDING    GRAM STAIN      Result Value Range   Specimen Description SYNOVIAL     Special Requests KNEE      Gram Stain       Value: NO ORGANISMS SEEN     WBC PRESENT, PREDOMINANTLY PMN     Gram Stain Report Called to,Read Back By and Verified With: ZAPPIA,J AT 1724 ON [id number] BY POTEAT,S     CYTOSPUN   Report Status 11/18/2013 FINAL    CBC WITH DIFFERENTIAL      Result Value Range   WBC 10.8 (*) 4.0 - 10.5 K/uL   RBC 3.96 (*) 4.22 - 5.81 MIL/uL   Hemoglobin 11.4 (*) 13.0 - 17.0 g/dL   HCT 16.1 (*) 09.6 - 04.5 %   MCV 88.6  78.0 - 100.0 fL   MCH 28.8  26.0 - 34.0 pg   MCHC 32.5  30.0 - 36.0 g/dL   RDW 40.9  81.1 - 91.4 %   Platelets 448 (*) 150 - 400 K/uL   Neutrophils Relative % 72  43 - 77 %   Neutro Abs 7.8 (*) 1.7 - 7.7 K/uL   Lymphocytes Relative 16  12 - 46 %   Lymphs Abs 1.7  0.7 - 4.0 K/uL   Monocytes Relative 12  3 - 12 %   Monocytes Absolute 1.3 (*) 0.1 - 1.0 K/uL   Eosinophils Relative 0  0 - 5 %   Eosinophils Absolute 0.0  0.0 - 0.7 K/uL   Basophils Relative 0  0 - 1 %   Basophils Absolute 0.0  0.0 - 0.1 K/uL  BASIC METABOLIC PANEL      Result Value Range   Sodium 133 (*) 135 - 145 mEq/L   Potassium 3.3 (*) 3.5 - 5.1 mEq/L   Chloride 94 (*) 96 - 112 mEq/L   CO2 26  19 - 32 mEq/L   Glucose, Bld 167 (*) 70 - 99 mg/dL   BUN 7  6 - 23 mg/dL   Creatinine, Ser 7.82  0.50 - 1.35 mg/dL   Calcium 9.7  8.4 - 95.6 mg/dL   GFR calc non Af Amer >90  >90 mL/min   GFR calc Af Amer >90  >90 mL/min  TROPONIN I      Result Value Range   Troponin I <0.30  <0.30 ng/mL  URINALYSIS, ROUTINE W REFLEX MICROSCOPIC      Result Value Range   Color, Urine AMBER (*) YELLOW   APPearance CLOUDY (*) CLEAR   Specific Gravity, Urine 1.023  1.005 - 1.030   pH 5.5  5.0 - 8.0   Glucose, UA NEGATIVE  NEGATIVE mg/dL   Hgb urine dipstick SMALL (*) NEGATIVE   Bilirubin Urine SMALL (*) NEGATIVE   Ketones, ur NEGATIVE  NEGATIVE mg/dL   Protein, ur >213 (*) NEGATIVE mg/dL   Urobilinogen, UA 0.2  0.0 - 1.0 mg/dL   Nitrite NEGATIVE  NEGATIVE   Leukocytes, UA NEGATIVE  NEGATIVE  URINE MICROSCOPIC-ADD ON       Result Value Range   Squamous Epithelial / LPF RARE  RARE   WBC, UA 3-6  <3 WBC/hpf   RBC / HPF 0-2  <3 RBC/hpf   Bacteria, UA RARE  RARE   Casts HYALINE CASTS (*) NEGATIVE   Urine-Other MUCOUS PRESENT    HIV ANTIBODY (ROUTINE TESTING)      Result Value Range   HIV NON REACTIVE  NON REACTIVE  C-REACTIVE PROTEIN      Result Value Range   CRP 22.7 (*) <0.60 mg/dL  INFLUENZA PANEL BY PCR      Result Value Range   Influenza A By PCR NEGATIVE  NEGATIVE   Influenza B By PCR NEGATIVE  NEGATIVE   H1N1 flu by pcr NOT DETECTED  NOT DETECTED  BASIC METABOLIC PANEL      Result Value Range   Sodium 136  135 - 145 mEq/L   Potassium 3.8  3.5 - 5.1 mEq/L   Chloride 97  96 - 112 mEq/L   CO2 25  19 - 32 mEq/L   Glucose, Bld 191 (*) 70 - 99 mg/dL   BUN 9  6 - 23 mg/dL   Creatinine, Ser 1.61  0.50 - 1.35 mg/dL   Calcium 9.3  8.4 - 09.6 mg/dL   GFR calc non Af Amer >90  >90 mL/min   GFR calc Af Amer >90  >90 mL/min  CBC      Result Value Range   WBC 11.7 (*) 4.0 - 10.5 K/uL   RBC 3.73 (*) 4.22 - 5.81 MIL/uL   Hemoglobin 11.1 (*) 13.0 - 17.0 g/dL   HCT 04.5 (*) 40.9 - 81.1 %   MCV 89.5  78.0 - 100.0 fL   MCH 29.8  26.0 - 34.0 pg   MCHC 33.2  30.0 - 36.0 g/dL   RDW 91.4  78.2 - 95.6 %   Platelets 427 (*) 150 - 400 K/uL  SEDIMENTATION RATE      Result Value Range   Sed Rate 121 (*) 0 - 16 mm/hr  GLUCOSE, CAPILLARY      Result Value Range   Glucose-Capillary 258 (*) 70 - 99 mg/dL  HEPATIC FUNCTION PANEL      Result Value Range   Total Protein 7.5  6.0 - 8.3 g/dL   Albumin 2.8 (*) 3.5 - 5.2 g/dL   AST 14  0 - 37 U/L   ALT 9  0 - 53 U/L   Alkaline Phosphatase 71  39 - 117 U/L   Total Bilirubin 0.6  0.3 - 1.2 mg/dL   Bilirubin, Direct 0.2  0.0 - 0.3 mg/dL   Indirect Bilirubin 0.4  0.3 - 0.9 mg/dL  URIC ACID      Result Value Range   Uric Acid, Serum 8.5 (*) 4.0 - 7.8 mg/dL  SYNOVIAL CELL COUNT + DIFF, W/ CRYSTALS      Result Value Range   Color, Synovial STRAW (*) YELLOW    Appearance-Synovial CLOUDY (*) CLEAR   Crystals, Fluid NO CRYSTALS SEEN     WBC, Synovial 21308 (*) 0 - 200 /cu mm   Neutrophil, Synovial 91 (*) 0 - 25 %   Lymphocytes-Synovial Fld 5  0 - 20 %   Monocyte-Macrophage-Synovial Fluid 4 (*) 50 - 90 %  GLUCOSE, CAPILLARY      Result Value Range   Glucose-Capillary 176 (*) 70 - 99 mg/dL  GLUCOSE, CAPILLARY  Result Value Range   Glucose-Capillary 127 (*) 70 - 99 mg/dL  PATHOLOGIST SMEAR REVIEW      Result Value Range   Path Review Reviewed by Tamsen Roers, M.D.    GLUCOSE, CAPILLARY      Result Value Range   Glucose-Capillary 184 (*) 70 - 99 mg/dL  CBC WITH DIFFERENTIAL      Result Value Range   WBC 11.1 (*) 4.0 - 10.5 K/uL   RBC 3.34 (*) 4.22 - 5.81 MIL/uL   Hemoglobin 9.8 (*) 13.0 - 17.0 g/dL   HCT 69.6 (*) 29.5 - 28.4 %   MCV 91.3  78.0 - 100.0 fL   MCH 29.3  26.0 - 34.0 pg   MCHC 32.1  30.0 - 36.0 g/dL   RDW 13.2 (*) 44.0 - 10.2 %   Platelets 448 (*) 150 - 400 K/uL   Neutrophils Relative % 73  43 - 77 %   Neutro Abs 8.0 (*) 1.7 - 7.7 K/uL   Lymphocytes Relative 16  12 - 46 %   Lymphs Abs 1.8  0.7 - 4.0 K/uL   Monocytes Relative 11  3 - 12 %   Monocytes Absolute 1.2 (*) 0.1 - 1.0 K/uL   Eosinophils Relative 1  0 - 5 %   Eosinophils Absolute 0.1  0.0 - 0.7 K/uL   Basophils Relative 0  0 - 1 %   Basophils Absolute 0.0  0.0 - 0.1 K/uL  COMPREHENSIVE METABOLIC PANEL      Result Value Range   Sodium 134 (*) 135 - 145 mEq/L   Potassium 4.0  3.5 - 5.1 mEq/L   Chloride 99  96 - 112 mEq/L   CO2 24  19 - 32 mEq/L   Glucose, Bld 152 (*) 70 - 99 mg/dL   BUN 10  6 - 23 mg/dL   Creatinine, Ser 7.25  0.50 - 1.35 mg/dL   Calcium 9.3  8.4 - 36.6 mg/dL   Total Protein 7.0  6.0 - 8.3 g/dL   Albumin 2.5 (*) 3.5 - 5.2 g/dL   AST 52 (*) 0 - 37 U/L   ALT 31  0 - 53 U/L   Alkaline Phosphatase 84  39 - 117 U/L   Total Bilirubin 0.3  0.3 - 1.2 mg/dL   GFR calc non Af Amer >90  >90 mL/min   GFR calc Af Amer >90  >90 mL/min  GLUCOSE,  CAPILLARY      Result Value Range   Glucose-Capillary 153 (*) 70 - 99 mg/dL  GLUCOSE, CAPILLARY      Result Value Range   Glucose-Capillary 130 (*) 70 - 99 mg/dL  VANCOMYCIN, TROUGH      Result Value Range   Vancomycin Tr 13.5  10.0 - 20.0 ug/mL  GLUCOSE, CAPILLARY      Result Value Range   Glucose-Capillary 154 (*) 70 - 99 mg/dL    MDM   Darryl Hurley is a 52 y.o. male with a PMH of HTN, gout, angina pectoris, COPD on home O2, asthma, SOB, arthritis, chronic lower back pain, pneumonia who presents to the ED for evaluation of weakness.   Rechecks  4:30 PM = 100.31F temp by me personally.   6:00 PM = Rectal temp 102.  Dr. Gwendolyn Grant attempted to perform LP but was unsuccessful.  Called radiologist who agreed to perform procedure, however, patient refused.  Patient started on Vanco and Rocephin for possible meningitis.  Tylenol ordered for fever.    Consults  7:30 PM = Spoke with Dr. Waymon Amato who agrees to admit the patient.       Patient admitted for possible meningitis.  Patient has a fever or 102 in the ED.  Patient complains of neck stiffness and will not move neck on exam.  Unsuccessful LP in the ED and patient refused further LP with radiology.  Blood cultures draw and patient started on Rocephin and Vanco.  Mild leukocytosis with 10.8.  Head CT negative for an acute intracranial process.  Chest x-ray negative for an acute cardiopulmonary process.  No evidence of UTI.  Patient also complained of joint pains from his gout.  Patient admitted for further evaluation and management.  Patient in agreement with admission and plan.     Final impressions: 1. Fever   2. Febrile illness, acute   3. Acute gouty arthritis   4. Dehydration   5. Hypokalemia   6. Chronic obstructive pulmonary disease (COPD)   7. Headache       Luiz Iron PA-C   This patient was discussed with Dr. Serita Grit, PA-C 11/19/13 1559

## 2013-11-18 ENCOUNTER — Inpatient Hospital Stay (HOSPITAL_COMMUNITY): Payer: Medicaid Other

## 2013-11-18 DIAGNOSIS — R51 Headache: Secondary | ICD-10-CM

## 2013-11-18 DIAGNOSIS — F1410 Cocaine abuse, uncomplicated: Secondary | ICD-10-CM | POA: Diagnosis present

## 2013-11-18 DIAGNOSIS — J449 Chronic obstructive pulmonary disease, unspecified: Secondary | ICD-10-CM

## 2013-11-18 LAB — INFLUENZA PANEL BY PCR (TYPE A & B)
Influenza A By PCR: NEGATIVE
Influenza B By PCR: NEGATIVE

## 2013-11-18 LAB — BASIC METABOLIC PANEL
BUN: 9 mg/dL (ref 6–23)
CO2: 25 mEq/L (ref 19–32)
Calcium: 9.3 mg/dL (ref 8.4–10.5)
GFR calc non Af Amer: >90 mL/min (ref >90)
Glucose, Bld: 191 mg/dL — ABNORMAL HIGH (ref 70–99)
Potassium: 3.8 mEq/L (ref 3.5–5.1)
Sodium: 136 mEq/L (ref 135–145)

## 2013-11-18 LAB — GRAM STAIN: Gram Stain: NONE SEEN

## 2013-11-18 LAB — URINE CULTURE
Colony Count: NO GROWTH
Culture: NO GROWTH

## 2013-11-18 LAB — CBC
HCT: 33.4 % — ABNORMAL LOW (ref 39.0–52.0)
Hemoglobin: 11.1 g/dL — ABNORMAL LOW (ref 13.0–17.0)
MCH: 29.8 pg (ref 26.0–34.0)
Platelets: 427 10*3/uL — ABNORMAL HIGH (ref 150–400)
RBC: 3.73 MIL/uL — ABNORMAL LOW (ref 4.22–5.81)
WBC: 11.7 10*3/uL — ABNORMAL HIGH (ref 4.0–10.5)

## 2013-11-18 LAB — HIV ANTIBODY (ROUTINE TESTING W REFLEX): HIV: NONREACTIVE

## 2013-11-18 LAB — HEPATIC FUNCTION PANEL
AST: 14 U/L (ref 0–37)
Albumin: 2.8 g/dL — ABNORMAL LOW (ref 3.5–5.2)
Alkaline Phosphatase: 71 U/L (ref 39–117)
Indirect Bilirubin: 0.4 mg/dL (ref 0.3–0.9)
Total Bilirubin: 0.6 mg/dL (ref 0.3–1.2)

## 2013-11-18 LAB — GLUCOSE, CAPILLARY: Glucose-Capillary: 153 mg/dL — ABNORMAL HIGH (ref 70–99)

## 2013-11-18 LAB — C-REACTIVE PROTEIN: CRP: 22.7 mg/dL — ABNORMAL HIGH (ref <0.60)

## 2013-11-18 LAB — URIC ACID: Uric Acid, Serum: 8.5 mg/dL — ABNORMAL HIGH (ref 4.0–7.8)

## 2013-11-18 IMAGING — RF DG FLUORO GUIDE NDL PLC/BX
2 series · 2 of 2 positions shown · non-contrast
Comparison: none

CLINICAL DATA: Left knee joint effusion.

[Series 1: run · 1 of 1 slices shown (1 of 2)]
[im 1/1]
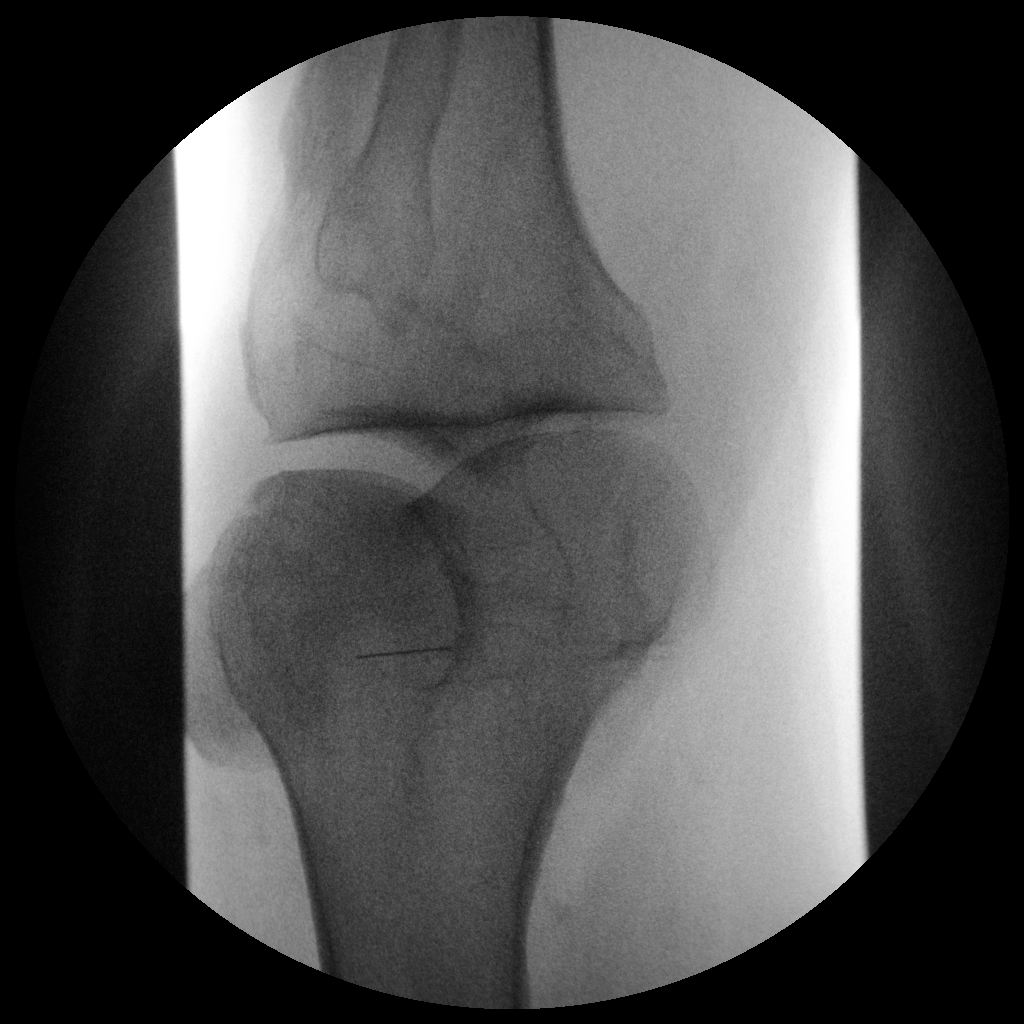

[Series 2: run · 1 of 1 slices shown (2 of 2)]
[im 1/1]
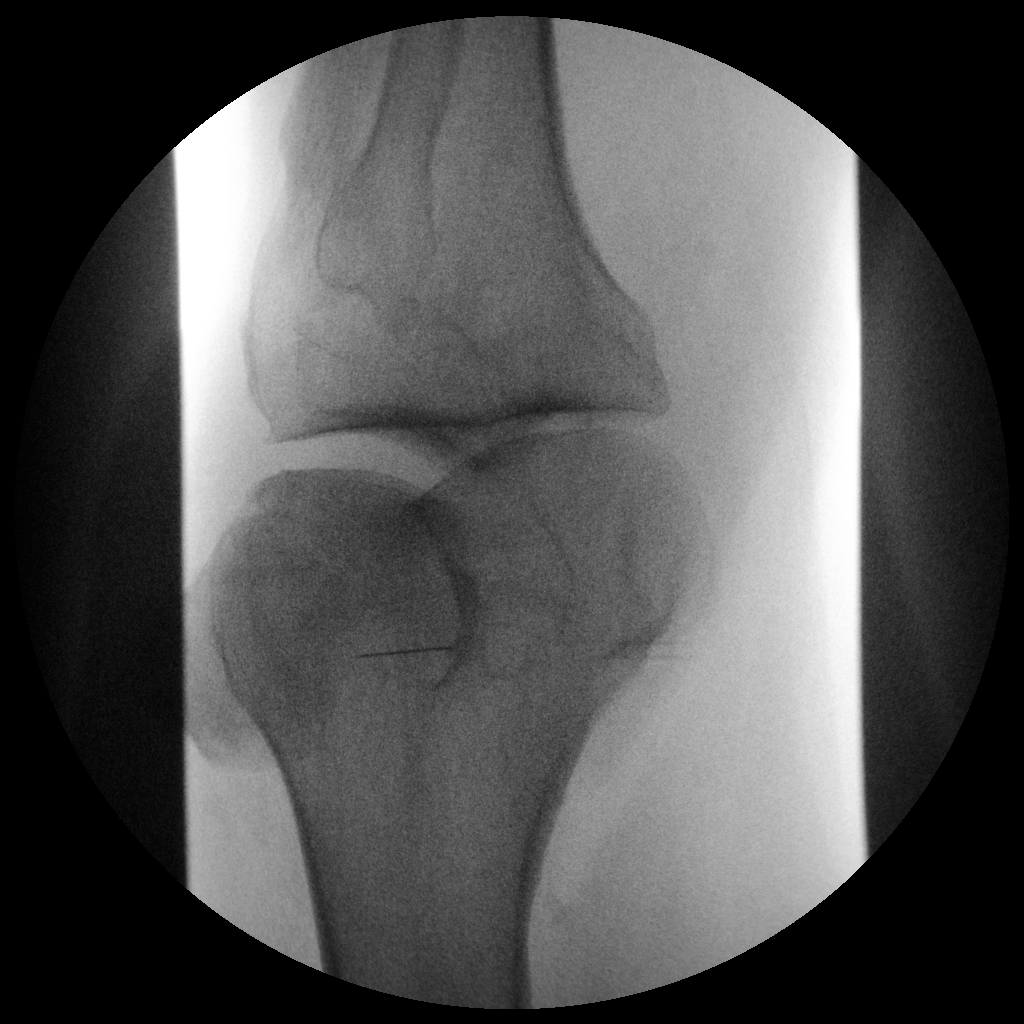

[2 of 2 positions shown; findings below may reference images not displayed]

EXAM:
Left knee joint aspiration

PROCEDURE:
Overlying skin prepped with Betadine, draped in the usual sterile
fashion, and infiltrated locally with buffered Lidocaine. Local
anesthesia was administered through a 25 gauge needle. Joint fluid
was easily aspirated into the 25 gauge needle, so the joint
aspiration was performed through this needle. 10 cc of slightly
cloudy and yellow joint fluid removed and sent to the order labs.
IMPRESSION: Successful left knee joint aspiration.  Sample sent to ordered labs.

FLUOROSCOPY:
FLUOROSCOPY :
0 min 2 seconds.

## 2013-11-18 MED ORDER — HYDRALAZINE HCL 20 MG/ML IJ SOLN
5.0000 mg | Freq: Once | INTRAMUSCULAR | Status: AC
  Administered 2013-11-18: 06:00:00 5 mg via INTRAVENOUS
  Filled 2013-11-18: qty 1

## 2013-11-18 NOTE — Consult Note (Signed)
[facility]    Date of Admission:  11/17/2013           Day 1 vancomycin        Day 1 ceftriaxone        Day 1 ampicillin       Reason for Consult: Fever, polyarthritis, headache and stiff neck    Referring Physician: Dr. Onalee Hua Tat Primary Care Physician: Dr. Standley Dakins  Principal Problem:   Fever Active Problems:   Headache   Polyarthritis   TOBACCO ABUSE   Severe uncontrolled hypertension   Chronic obstructive pulmonary disease (COPD)   Gout   Type 2 diabetes mellitus   Anemia   Hypokalemia   Dehydration   Cocaine abuse   . amLODipine  2.5 mg Oral q morning - 10a  . ampicillin (OMNIPEN) IV  2 g Intravenous Q4H  . atenolol  100 mg Oral q morning - 10a  . cefTRIAXone (ROCEPHIN)  IV  2 g Intravenous Q12H  . famotidine  20 mg Oral BID  . heparin  5,000 Units Subcutaneous Q8H  . insulin aspart  0-5 Units Subcutaneous QHS  . insulin aspart  0-9 Units Subcutaneous TID WC  . losartan  100 mg Oral q morning - 10a  . naproxen  500 mg Oral BID WC  . predniSONE  5 mg Oral Q breakfast  . simvastatin  20 mg Oral q1800  . sodium chloride  3 mL Intravenous Q12H  . vancomycin  1,000 mg Intravenous Q8H    Recommendations: 1. Continue vancomycin and ceftriaxone pending blood and synovial fluid cultures 2. Discontinue ampicillin 3. Discontinue droplet precautions   Assessment: He has fever and polyarthritis. He had a headache and stiff neck on admission but those have resolved completely now. I think meningitis is extremely unlikely and will stop empiric therapy to cover listeria. His left knee aspirate showed only a little over 18,000 white blood cells which is not highly suggestive of septic arthritis but no crystals were seen. His stat Gram stain is still pending. I will continue empiric vancomycin and ceftriaxone to cover for the possibility of bacteremia and septic arthritis while waiting on stains and cultures.   HPI: Darryl Hurley is a 52 y.o. male with a long history of gout who states that he began to develop acute pain and swelling of his elbows, wrists, left knee and ankle 3 days ago. He also noted some fever. He states that this is very typical of his previous gout flares. In the past when his gout with flare he would take colchicine but he states that he has been unable to afford it recently. He tried treating his pain with over-the-counter nonsteroidal medications without relief leading to his admission last night. He was completely alert and oriented about the cause of his headache and stiff neck there was concern about meningitis. Lumbar puncture was attempted in the emergency department but was unsuccessful and he refused further attempts. He states he is not having any headache now and his stiff neck has resolved. He is still bothered by joint pains. He says that he has been followed at [facility] in the past but I note that [facility] by Dr. Standley Dakins. His temperature was 102 upon admission. He states that he usually has high fevers when his gout flares.   Review of Systems: Constitutional: positive for fevers and malaise,  negative for anorexia, chills, sweats and weight loss Eyes: negative Ears, nose, mouth, throat, and face: negative Respiratory: positive for dyspnea on exertion and he is on home oxygen therapy, negative for any recent increase in cough or sputum production Cardiovascular: negative Gastrointestinal: negative Genitourinary:negative Musculoskeletal:positive for arthralgias, neck pain and stiff joints, negative for muscle weakness and myalgias  Past Medical History  Diagnosis Date  . Hypertension   . Gout   . Angina pectoris   . COPD (chronic obstructive pulmonary disease)   . Asthma   . Exertional shortness of breath   . Arthritis   . Chronic lower back pain   . Pneumonia     "recently had walking pneumonia" (05/12/2013)  . On home  oxygen therapy     "2L 24/7" (05/12/2013)  . Diabetes mellitus without complication     History  Substance Use Topics  . Smoking status: Current Every Day Smoker -- 0.50 packs/day for 40 years    Types: Cigarettes  . Smokeless tobacco: Never Used     Comment: 05/12/2013 'eased off smoking since last month"  . Alcohol Use: 42.0 oz/week    70 Cans of beer per week     Comment: 05/12/2013 "usually drink 3, 40oz/day", every now and then - 11/17/13    Family History  Problem Relation Age of Onset  . Diabetes type II Mother   . Depression Father   . Asthma Brother    No Known Allergies  OBJECTIVE: Blood pressure 128/95, pulse 70, temperature 97.7 F (36.5 C), temperature source Oral, resp. rate 20, height 5\' 5"  (1.651 m), weight 85.2 kg (187 lb 13.3 oz), SpO2 98.00%. General: He is alert, talkative and in no distress Neck: Supple Skin: No acute rash. No split or conjunctival hemorrhages noted Lungs: Clear Cor: Regular S1 and S2 with no murmurs Abdomen: Soft and nontender Joints and extremities: There is warmth and swelling of his elbows, wrists, fingers, left knee and left ankle. He has subcutaneous nodules on the extensor surfaces of his forearms Neuro: He is fully oriented with normal speech and conversation. Cranial nerves are intact. Full neurologic testing is somewhat difficult because of his joint pains but he does not appear to have any focal weakness.  Lab Results Lab Results  Component Value Date   WBC 11.7* 11/18/2013   HGB 11.1* 11/18/2013   HCT 33.4* 11/18/2013   MCV 89.5 11/18/2013   PLT 427* 11/18/2013    Lab Results  Component Value Date   CREATININE 0.94 11/18/2013   BUN 9 11/18/2013   NA 136 11/18/2013   K 3.8 11/18/2013   CL 97 11/18/2013   CO2 25 11/18/2013    Lab Results  Component Value Date   ALT 9 11/18/2013   AST 14 11/18/2013   ALKPHOS 71 11/18/2013   BILITOT 0.6 11/18/2013    Left knee synovial fluid: Cloudy, 18,660 white blood cells with  91% neutrophils. No crystals seen. Gram stain pending  Microbiology: Recent Results (from the past 240 hour(s))  CULTURE, BLOOD (ROUTINE X 2)     Status: None   Collection Time    11/17/13  6:50 PM      Result Value Range Status   Specimen Description BLOOD RIGHT HAND   Final   Special Requests BOTTLES DRAWN AEROBIC AND ANAEROBIC 3CC   Final   Culture  Setup Time     Final   Value: 11/17/2013 22:18     Performed at [facility]  Final   Value:        BLOOD CULTURE RECEIVED NO GROWTH TO DATE CULTURE WILL BE HELD FOR 5 DAYS BEFORE ISSUING A FINAL NEGATIVE REPORT     Performed at [facility]   Report Status PENDING   Incomplete  CULTURE, BLOOD (ROUTINE X 2)     Status: None   Collection Time    11/17/13  6:55 PM      Result Value Range Status   Specimen Description BLOOD LEFT ARM   Final   Special Requests BOTTLES DRAWN AEROBIC AND ANAEROBIC 5CC   Final   Culture  Setup Time     Final   Value: 11/17/2013 22:18     Performed at [facility]   Culture     Final   Value:        BLOOD CULTURE RECEIVED NO GROWTH TO DATE CULTURE WILL BE HELD FOR 5 DAYS BEFORE ISSUING A FINAL NEGATIVE REPORT     Performed at [facility]   Report Status PENDING   Incomplete    Cliffton Asters, MD [facility] [phone] pager   [phone] cell 11/18/2013, 4:37 PM

## 2013-11-18 NOTE — Progress Notes (Signed)
Nutrition Brief Note  Patient identified on the Malnutrition Screening Tool (MST) Report  Wt Readings from Last 15 Encounters:  11/17/13 187 lb 13.3 oz (85.2 kg)  05/14/13 188 lb 2 oz (85.333 kg)  05/12/13 186 lb 11.7 oz (84.7 kg)  05/12/13 190 lb (86.183 kg)  05/04/13 178 lb 6.4 oz (80.922 kg)  01/20/13 186 lb 4.6 oz (84.5 kg)  12/06/11 160 lb (72.576 kg)  04/27/09 196 lb (88.905 kg)    Body mass index is 31.26 kg/(m^2). Patient meets criteria for Obesity based on current BMI. Weight History shows that pt's weight has been stable around 188 lbs for the past 6 months.  Current diet order is Carb Modified, patient is consuming approximately 75% of meals at this time. Pt out of room for knee tap at time of visit per RN. RN reports that pt ate most of lunch tray today. Labs and medications reviewed.   No nutrition interventions warranted at this time. If nutrition issues arise, please consult RD.   Ian Malkin RD, LDN Inpatient Clinical Dietitian Pager: [phone] After Hours Pager: [phone]

## 2013-11-18 NOTE — Progress Notes (Signed)
[facility] PROGRESS NOTE  Darryl Hurley AVW:[id number] DOB: [birth year] DOA: 11/17/2013 PCP: No primary provider on file.  Assessment/Plan: FUO with polyarthritis/headache and neck stiffness -Patient continues to refuse lumbar puncture even after offering LP to be done by interventional radiology -He expressed understanding of risks including but not limited to worsening infection and death -Followup influenza PCR -Followup HIV RNA and HIV antibody -Continue empiric antibiotics pending blood cultures -Chest x-ray negative for infiltrates -Urinalysis negative pyuria -Clinically, headache and neck pain seems to be improving today -Consider arthrocentesis--patient states that he may be amendable to this  -continue IVF -echocardiogram if fever persists -Sedimentation rate 121 -Certainly, patient may have aseptic meningitis from NSAIDs- -very low suspicion of tickborne infection--no epidemiologic evidence  -check LFTs, uric acid Polyarthritis/synovitis -Check uric acid -Suspect this is likely due to the patient's gout -Lower suspicion of septic arthritis as this is a polyarthritis Dehydration/hyponatremia -Improving with IV fluids COPD with continued tobacco use -Tobacco cessation discussed -Patient is oxygen and steroid dependent Hypertension -Partly due to uncontrolled pain -Continue antihypertensive regimen from home and monitor Diabetes mellitus type II, uncontrolled -Check hemoglobin A1c -NovoLog sliding scale History of angina -Currently chest pain-free -EKG is reassuring  Family Communication:   None today Disposition Plan:   Home when medically stable  Antibiotics:  Ceftriaxone 11/17/13>>>  ampicilin 11/17/13>>>  vanco 11/17/13>>>    Procedures/Studies: Dg Chest 2 View  11/17/2013   CLINICAL DATA:  Shortness of breath and chest pain  EXAM: CHEST  2 VIEW  COMPARISON:  None.  FINDINGS: Cardiac shadow is stable. Lungs are clear bilaterally. No acute  bony abnormality is seen.  IMPRESSION: No acute abnormality noted.   Electronically Signed   By: Alcide Clever M.D.   On: 11/17/2013 17:36   Dg Chest 2 View  10/20/2013   CLINICAL DATA:  Shortness of breath.  EXAM: CHEST  2 VIEW  COMPARISON:  Chest radiograph September 27, 2013  FINDINGS: Cardiac silhouette appears upper limits of normal, even with consideration to this low inspiratory examination with crowded vasculature markings. Linear density in left lung base likely reflects atelectasis. No pleural effusions or focal consolidations. Pulmonary vasculature is unremarkable. Trachea projects midline and there is no pneumothorax. Degenerative changes acromioclavicular joints.  IMPRESSION: Borderline cardiomegaly, with linear density in left lung base likely reflecting atelectasis.   Electronically Signed   By: Awilda Metro   On: 10/20/2013 03:33   Ct Head Wo Contrast  11/17/2013   CLINICAL DATA:  Headache and neck stiffness.  Weakness.  EXAM: CT HEAD WITHOUT CONTRAST  TECHNIQUE: Contiguous axial images were obtained from the base of the skull through the vertex without intravenous contrast.  COMPARISON:  12/29/2012  FINDINGS: The ventricles and sulci are within normal limits for age. There is no evidence of acute infarct, intracranial hemorrhage, mass, midline shift, or extra-axial collection. The orbits are unremarkable. The visualized paranasal sinuses and mastoid air cells are clear. There is no evidence of acute fracture.  IMPRESSION: Unremarkable head CT.   Electronically Signed   By: Sebastian Ache   On: 11/17/2013 18:06         Subjective: Patient states that headache and are somewhat better. Continues to complain of joint pains particularly in his record and left elbow, left greater than right knee. Denies any chest pain, shortness breath, coughing, hemoptysis, nausea, vomiting, diarrhea, abdominal pain, dysuria.   Objective: Filed Vitals:   11/18/13 0043 11/18/13 0448 11/18/13 0546  11/18/13 0720  BP:  166/104 174/100 165/94  Pulse:  63    Temp: 98.3 F (36.8 C) 97.6 F (36.4 C)  97.5 F (36.4 C)  TempSrc: Oral Oral  Oral  Resp:  20    Height:      Weight:      SpO2:  99%      Intake/Output Summary (Last 24 hours) at 11/18/13 0831 Last data filed at 11/18/13 0630  Gross per 24 hour  Intake   2040 ml  Output    675 ml  Net   1365 ml   Weight change:  Exam:   General:  Pt is alert, follows commands appropriately, not in acute distress  HEENT: No icterus, No thrush,  [place]/AT; neck is soft and supple without meningismus   Cardiovascular: RRR, S1/S2, no rubs, no gallops  Respiratory: Bibasilar rales. Minimal bibasilar wheeze. Good air movement.   Abdomen: Soft/+BS, non tender, non distended, no guarding  Extremities:Synovitis about the right elbow and left knee with warmth and effusion without crepitance. No unusual rashes noted on extremities or trunk.  Data Reviewed: Basic Metabolic Panel:  Recent Labs Lab 11/17/13 1740 11/18/13 0530  NA 133* 136  K 3.3* 3.8  CL 94* 97  CO2 26 25  GLUCOSE 167* 191*  BUN 7 9  CREATININE 0.87 0.94  CALCIUM 9.7 9.3   Liver Function Tests: No results found for this basename: AST, ALT, ALKPHOS, BILITOT, PROT, ALBUMIN,  in the last 168 hours No results found for this basename: LIPASE, AMYLASE,  in the last 168 hours No results found for this basename: AMMONIA,  in the last 168 hours CBC:  Recent Labs Lab 11/17/13 1740 11/18/13 0530  WBC 10.8* 11.7*  NEUTROABS 7.8*  --   HGB 11.4* 11.1*  HCT 35.1* 33.4*  MCV 88.6 89.5  PLT 448* 427*   Cardiac Enzymes:  Recent Labs Lab 11/17/13 1740  TROPONINI <0.30   BNP: No components found with this basename: POCBNP,  CBG:  Recent Labs Lab 11/17/13 2117  GLUCAP 258*    No results found for this or any previous visit (from the past 240 hour(s)).   Scheduled Meds: . amLODipine  2.5 mg Oral q morning - 10a  . ampicillin (OMNIPEN) IV  2 g Intravenous  Q4H  . atenolol  100 mg Oral q morning - 10a  . cefTRIAXone (ROCEPHIN)  IV  2 g Intravenous Q12H  . famotidine  20 mg Oral BID  . heparin  5,000 Units Subcutaneous Q8H  . insulin aspart  0-5 Units Subcutaneous QHS  . insulin aspart  0-9 Units Subcutaneous TID WC  . losartan  100 mg Oral q morning - 10a  . naproxen  500 mg Oral BID WC  . predniSONE  5 mg Oral Q breakfast  . simvastatin  20 mg Oral q1800  . sodium chloride  3 mL Intravenous Q12H  . vancomycin  1,000 mg Intravenous Q8H   Continuous Infusions: . 0.9 % NaCl with KCl 40 mEq / L 150 mL/hr at 11/18/13 1610     Binnie Vonderhaar, DO  [facility] Pager [phone]  If 7PM-7AM, please contact night-coverage [email or web address] Password [email or web address] 11/18/2013, 8:31 AM   LOS: 1 day

## 2013-11-18 NOTE — Care Management Note (Signed)
    Page 1 of 1   11/18/2013     2:47:32 PM   CARE MANAGEMENT NOTE 11/18/2013  Patient:  Darryl Hurley, Darryl Hurley   Account Number:  [id number]  Date Initiated:  11/18/2013  Documentation initiated by:  Lanier Clam  Subjective/Objective Assessment:   52 Y/O M ADMITTED W/FEVER.     Action/Plan:   FROM HOME.   Anticipated DC Date:  11/20/2013   Anticipated DC Plan:  HOME/SELF CARE      DC Planning Services  CM consult      Choice offered to / List presented to:             Status of service:  In process, will continue to follow Medicare Important Message given?   (If response is "NO", the following Medicare IM given date fields will be blank) Date Medicare IM given:   Date Additional Medicare IM given:    Discharge Disposition:    Per UR Regulation:  Reviewed for med. necessity/level of care/duration of stay  If discussed at Long Length of Stay Meetings, dates discussed:    Comments:

## 2013-11-18 NOTE — Progress Notes (Signed)
Inpatient Diabetes Program Recommendations  AACE/ADA: New Consensus Statement on Inpatient Glycemic Control (2013)  Target Ranges:  Prepandial:   less than 140 mg/dL      Peak postprandial:   less than 180 mg/dL (1-2 hours)      Critically ill patients:  140 - 180 mg/dL   Reason for Visit: Hyperglycemia Results for OTONIEL, MYHAND (MRN [id number]) as of 11/18/2013 12:14  Ref. Range 11/17/2013 21:17 11/18/2013 07:27 11/18/2013 11:39  Glucose-Capillary Latest Range: 70-99 mg/dL 478 (H) 295 (H) 621 (H)  Hyperglycemia - on Prednisone 5mg  at home   Inpatient Diabetes Program Recommendations HgbA1C: Need updated HgbA1C to assess glycemic control prior to hospitalization  Note: Will follow blood sugars daily. Thank you. Ailene Ards, RD, LDN, CDE Inpatient Diabetes Coordinator [phone]

## 2013-11-19 DIAGNOSIS — M130 Polyarthritis, unspecified: Secondary | ICD-10-CM

## 2013-11-19 LAB — CBC WITH DIFFERENTIAL/PLATELET
Basophils Relative: 0 % (ref 0–1)
Eosinophils Absolute: 0.1 10*3/uL (ref 0.0–0.7)
Lymphs Abs: 1.8 10*3/uL (ref 0.7–4.0)
MCH: 29.3 pg (ref 26.0–34.0)
MCHC: 32.1 g/dL (ref 30.0–36.0)
Monocytes Absolute: 1.2 10*3/uL — ABNORMAL HIGH (ref 0.1–1.0)
Neutro Abs: 8 10*3/uL — ABNORMAL HIGH (ref 1.7–7.7)
Neutrophils Relative %: 73 % (ref 43–77)
Platelets: 448 10*3/uL — ABNORMAL HIGH (ref 150–400)
WBC: 11.1 10*3/uL — ABNORMAL HIGH (ref 4.0–10.5)

## 2013-11-19 LAB — COMPREHENSIVE METABOLIC PANEL
ALT: 31 U/L (ref 0–53)
Albumin: 2.5 g/dL — ABNORMAL LOW (ref 3.5–5.2)
Alkaline Phosphatase: 84 U/L (ref 39–117)
BUN: 10 mg/dL (ref 6–23)
CO2: 24 mEq/L (ref 19–32)
Calcium: 9.3 mg/dL (ref 8.4–10.5)
GFR calc non Af Amer: >90 mL/min (ref >90)
Potassium: 4 mEq/L (ref 3.5–5.1)
Sodium: 134 mEq/L — ABNORMAL LOW (ref 135–145)
Total Protein: 7 g/dL (ref 6.0–8.3)

## 2013-11-19 LAB — GLUCOSE, CAPILLARY
Glucose-Capillary: 130 mg/dL — ABNORMAL HIGH (ref 70–99)
Glucose-Capillary: 154 mg/dL — ABNORMAL HIGH (ref 70–99)
Glucose-Capillary: 202 mg/dL — ABNORMAL HIGH (ref 70–99)

## 2013-11-19 LAB — VANCOMYCIN, TROUGH: Vancomycin Tr: 13.5 ug/mL (ref 10.0–20.0)

## 2013-11-19 LAB — SYNOVIAL CELL COUNT + DIFF, W/ CRYSTALS
Monocyte-Macrophage-Synovial Fluid: 4 % — ABNORMAL LOW (ref 50–90)
Neutrophil, Synovial: 91 % — ABNORMAL HIGH (ref 0–25)
WBC, Synovial: 18660 /mm3 — ABNORMAL HIGH (ref 0–200)

## 2013-11-19 LAB — PATHOLOGIST SMEAR REVIEW

## 2013-11-19 NOTE — Progress Notes (Signed)
ANTIBIOTIC CONSULT NOTE - FOLLOW UP  Pharmacy Consult for vancomycin Indication: r/o meningitis and/or septic arthritis  No Known Allergies  Patient Measurements: Height: 5\' 5"  (165.1 cm) Weight: 187 lb 13.3 oz (85.2 kg) IBW/kg (Calculated) : 61.5 Adjusted Body Weight:  Vital Signs: Temp: 97.3 F (36.3 C) (12/18 0437) Temp src: Oral (12/18 0437) BP: 168/110 mmHg (12/18 0714) Pulse Rate: 71 (12/18 0437) Intake/Output from previous day: 12/17 0701 - 12/18 0700 In: 4440 [P.O.:240; I.V.:3500; IV Piggyback:700] Out: 1775 [Urine:1775] Intake/Output from this shift: Total I/O In: -  Out: 1251 [Urine:1250; Stool:1]  Labs:  Recent Labs  11/17/13 1740 11/18/13 0530 11/19/13 0506  WBC 10.8* 11.7* 11.1*  HGB 11.4* 11.1* 9.8*  PLT 448* 427* 448*  CREATININE 0.87 0.94 0.81   Estimated Creatinine Clearance: 108.4 ml/min (by C-G formula based on Cr of 0.81).  Recent Labs  11/19/13 1212  VANCOTROUGH 13.5     Microbiology: Recent Results (from the past 720 hour(s))  URINE CULTURE     Status: None   Collection Time    11/17/13  6:12 PM      Result Value Range Status   Specimen Description URINE, CLEAN CATCH   Final   Special Requests NONE   Final   Culture  Setup Time     Final   Value: 11/18/2013 01:26     Performed at [facility] Count     Final   Value: NO GROWTH     Performed at [facility]   Culture     Final   Value: NO GROWTH     Performed at [facility]   Report Status 11/18/2013 FINAL   Final  CULTURE, BLOOD (ROUTINE X 2)     Status: None   Collection Time    11/17/13  6:50 PM      Result Value Range Status   Specimen Description BLOOD RIGHT HAND   Final   Special Requests BOTTLES DRAWN AEROBIC AND ANAEROBIC 3CC   Final   Culture  Setup Time     Final   Value: 11/17/2013 22:18     Performed at [facility]   Culture     Final   Value:        BLOOD CULTURE RECEIVED NO GROWTH TO DATE CULTURE WILL BE HELD FOR 5  DAYS BEFORE ISSUING A FINAL NEGATIVE REPORT     Performed at [facility]   Report Status PENDING   Incomplete  CULTURE, BLOOD (ROUTINE X 2)     Status: None   Collection Time    11/17/13  6:55 PM      Result Value Range Status   Specimen Description BLOOD LEFT ARM   Final   Special Requests BOTTLES DRAWN AEROBIC AND ANAEROBIC 5CC   Final   Culture  Setup Time     Final   Value: 11/17/2013 22:18     Performed at [facility]   Culture     Final   Value:        BLOOD CULTURE RECEIVED NO GROWTH TO DATE CULTURE WILL BE HELD FOR 5 DAYS BEFORE ISSUING A FINAL NEGATIVE REPORT     Performed at [facility]   Report Status PENDING   Incomplete  BODY FLUID CULTURE     Status: None   Collection Time    11/18/13  2:40 PM      Result Value Range Status   Specimen Description KNEE LEFT  Final   Special Requests NONE   Final   Gram Stain     Final   Value: CYTOSPIN WBC PRESENT, PREDOMINANTLY PMN     NO ORGANISMS SEEN     GSRC ZAPPIA J AT 1724 ON 11/18/13 BY POTEAT S     Performed at [facility]   Culture     Final   Value: NO GROWTH     Performed at [facility]   Report Status PENDING   Incomplete  GRAM STAIN     Status: None   Collection Time    11/18/13  2:40 PM      Result Value Range Status   Specimen Description SYNOVIAL   Final   Special Requests KNEE   Final   Gram Stain     Final   Value: NO ORGANISMS SEEN     WBC PRESENT, PREDOMINANTLY PMN     Gram Stain Report Called to,Read Back By and Verified With: ZAPPIA,J AT 1724 ON 121714 BY POTEAT,S     CYTOSPUN   Report Status 11/18/2013 FINAL   Final    Anti-infectives   Start     Dose/Rate Route Frequency Ordered Stop   11/18/13 0700  cefTRIAXone (ROCEPHIN) 2 g in dextrose 5 % 50 mL IVPB     2 g 100 mL/hr over 30 Minutes Intravenous Every 12 hours 11/17/13 2003     11/18/13 0400  vancomycin (VANCOCIN) IVPB 1000 mg/200 mL premix     1,000 mg 200 mL/hr over 60 Minutes Intravenous Every  8 hours 11/17/13 2017     11/17/13 2100  ampicillin (OMNIPEN) 2 g in sodium chloride 0.9 % 50 mL IVPB  Status:  Discontinued     2 g 150 mL/hr over 20 Minutes Intravenous Every 4 hours 11/17/13 2017 11/18/13 1651   11/17/13 1730  vancomycin (VANCOCIN) IVPB 1000 mg/200 mL premix     1,000 mg 200 mL/hr over 60 Minutes Intravenous  Once 11/17/13 1716 11/17/13 2054   11/17/13 1730  cefTRIAXone (ROCEPHIN) 2 g in dextrose 5 % 50 mL IVPB     2 g 100 mL/hr over 30 Minutes Intravenous  Once 11/17/13 1716 11/17/13 1951     Assessment: 52 yr male with chief complaint of flu symptoms x 3 days along with neck pain, joint pain and headache. Upon admission, Vancomycin and Ampicillin ordered per pharmacy dosing along with Rocephin per MD.  ID has seen patient and d/c'd ampicillin.  LP attempted in ED but unsuccessful, patient has refused repeat LP.  Also, concern for septic arthritis s/p tap of knee.   12/16 >>Rocephin >> 12/16 >>Vanc >>  12/16>>Ampicillin>>12/17  Tmax: afeb x24h WBCs: 11.1 Renal: Scr = 0.81 for CrCl = >160ml/min (C-G) and (N)  12/16 blood: NGTD 12/16 urine: NG 12/16 CSF: refuses LP 12/17: L knee synovial fluid: NG 12/17: influenza panel: negative  Dose changes/drug level info:  12/18: VT 11:30am = 13.5 mcg/ml prior to 6th dose 1gm IV q8h  Goal of Therapy:  Vancomycin trough level 15-20 mcg/ml  Plan:  Day #2 vancomycin/ceftriaxone  Vancomycin trough slightly below goal of 15-35mcg/ml, based on unlikelihood of meningitis and clinical improvement, continue current vancomycin 1gm IV q8h  Juliette Alcide, PharmD, BCPS.   Pager: [phone] 11/19/2013,1:02 PM

## 2013-11-19 NOTE — Progress Notes (Signed)
Patient ID: Darryl Hurley, male   DOB: [birth year], 52 y.o.   MRN: [id number]         [facility]    Date of Admission:  11/17/2013           Day 2 vancomycin        Day 2 ceftriaxone Principal Problem:   Fever Active Problems:   Headache   Polyarthritis   TOBACCO ABUSE   Severe uncontrolled hypertension   Chronic obstructive pulmonary disease (COPD)   Gout   Type 2 diabetes mellitus   Anemia   Hypokalemia   Dehydration   Cocaine abuse   . amLODipine  2.5 mg Oral q morning - 10a  . atenolol  100 mg Oral q morning - 10a  . cefTRIAXone (ROCEPHIN)  IV  2 g Intravenous Q12H  . famotidine  20 mg Oral BID  . heparin  5,000 Units Subcutaneous Q8H  . insulin aspart  0-5 Units Subcutaneous QHS  . insulin aspart  0-9 Units Subcutaneous TID WC  . losartan  100 mg Oral q morning - 10a  . naproxen  500 mg Oral BID WC  . predniSONE  5 mg Oral Q breakfast  . simvastatin  20 mg Oral q1800  . sodium chloride  3 mL Intravenous Q12H  . vancomycin  1,000 mg Intravenous Q8H    Subjective: He is feeling much better with less joint pain.   Past Medical History  Diagnosis Date  . Hypertension   . Gout   . Angina pectoris   . COPD (chronic obstructive pulmonary disease)   . Asthma   . Exertional shortness of breath   . Arthritis   . Chronic lower back pain   . Pneumonia     "recently had walking pneumonia" (05/12/2013)  . On home oxygen therapy     "2L 24/7" (05/12/2013)  . Diabetes mellitus without complication     History  Substance Use Topics  . Smoking status: Current Every Day Smoker -- 0.50 packs/day for 40 years    Types: Cigarettes  . Smokeless tobacco: Never Used     Comment: 05/12/2013 'eased off smoking since last month"  . Alcohol Use: 42.0 oz/week    70 Cans of beer per week     Comment: 05/12/2013 "usually drink 3, 40oz/day", every now and then - 11/17/13    Family History  Problem Relation Age of Onset  . Diabetes type II Mother    . Depression Father   . Asthma Brother     No Known Allergies  Objective: Temp:  [97.3 F (36.3 C)-98 F (36.7 C)] 97.9 F (36.6 C) (12/18 1436) Pulse Rate:  [68-93] 93 (12/18 1436) Resp:  [16-24] 18 (12/18 1436) BP: (117-168)/(85-110) 117/85 mmHg (12/18 1436) SpO2:  [97 %-100 %] 100 % (12/18 1436)  General:  He looks much more comfortable Skin:  No rash Lungs:  Clear Cor:  Regular S1 and S2 no murmurs Abdomen:  Soft nontender Joints and extremities: pain and swelling in his wrists, hands left knee and ankle are much better. There is no unusual redness over his joints and little warmth  Lab Results Lab Results  Component Value Date   WBC 11.1* 11/19/2013   HGB 9.8* 11/19/2013   HCT 30.5* 11/19/2013   MCV 91.3 11/19/2013   PLT 448* 11/19/2013    Lab Results  Component Value Date   CREATININE 0.81 11/19/2013   BUN 10 11/19/2013   NA 134* 11/19/2013  K 4.0 11/19/2013   CL 99 11/19/2013   CO2 24 11/19/2013    Lab Results  Component Value Date   ALT 31 11/19/2013   AST 52* 11/19/2013   ALKPHOS 84 11/19/2013   BILITOT 0.3 11/19/2013      Microbiology: Recent Results (from the past 240 hour(s))  URINE CULTURE     Status: None   Collection Time    11/17/13  6:12 PM      Result Value Range Status   Specimen Description URINE, CLEAN CATCH   Final   Special Requests NONE   Final   Culture  Setup Time     Final   Value: 11/18/2013 01:26     Performed at [facility] Count     Final   Value: NO GROWTH     Performed at [facility]   Culture     Final   Value: NO GROWTH     Performed at [facility]   Report Status 11/18/2013 FINAL   Final  CULTURE, BLOOD (ROUTINE X 2)     Status: None   Collection Time    11/17/13  6:50 PM      Result Value Range Status   Specimen Description BLOOD RIGHT HAND   Final   Special Requests BOTTLES DRAWN AEROBIC AND ANAEROBIC 3CC   Final   Culture  Setup Time     Final   Value: 11/17/2013  22:18     Performed at [facility]   Culture     Final   Value:        BLOOD CULTURE RECEIVED NO GROWTH TO DATE CULTURE WILL BE HELD FOR 5 DAYS BEFORE ISSUING A FINAL NEGATIVE REPORT     Performed at [facility]   Report Status PENDING   Incomplete  CULTURE, BLOOD (ROUTINE X 2)     Status: None   Collection Time    11/17/13  6:55 PM      Result Value Range Status   Specimen Description BLOOD LEFT ARM   Final   Special Requests BOTTLES DRAWN AEROBIC AND ANAEROBIC 5CC   Final   Culture  Setup Time     Final   Value: 11/17/2013 22:18     Performed at [facility]   Culture     Final   Value:        BLOOD CULTURE RECEIVED NO GROWTH TO DATE CULTURE WILL BE HELD FOR 5 DAYS BEFORE ISSUING A FINAL NEGATIVE REPORT     Performed at [facility]   Report Status PENDING   Incomplete  BODY FLUID CULTURE     Status: None   Collection Time    11/18/13  2:40 PM      Result Value Range Status   Specimen Description KNEE LEFT   Final   Special Requests NONE   Final   Gram Stain     Final   Value: CYTOSPIN WBC PRESENT, PREDOMINANTLY PMN     NO ORGANISMS SEEN     GSRC ZAPPIA J AT 1724 ON 11/18/13 BY POTEAT S     Performed at [facility]   Culture     Final   Value: NO GROWTH     Performed at [facility]   Report Status PENDING   Incomplete  GRAM STAIN     Status: None   Collection Time    11/18/13  2:40 PM  Result Value Range Status   Specimen Description SYNOVIAL   Final   Special Requests KNEE   Final   Gram Stain     Final   Value: NO ORGANISMS SEEN     WBC PRESENT, PREDOMINANTLY PMN     Gram Stain Report Called to,Read Back By and Verified With: ZAPPIA,J AT 1724 ON 121714 BY POTEAT,S     CYTOSPUN   Report Status 11/18/2013 FINAL   Final    Studies/Results: Dg Chest 2 View  11/17/2013   CLINICAL DATA:  Shortness of breath and chest pain  EXAM: CHEST  2 VIEW  COMPARISON:  None.  FINDINGS: Cardiac shadow is stable. Lungs are  clear bilaterally. No acute bony abnormality is seen.  IMPRESSION: No acute abnormality noted.   Electronically Signed   By: Alcide Clever M.D.   On: 11/17/2013 17:36   Ct Head Wo Contrast  11/17/2013   CLINICAL DATA:  Headache and neck stiffness.  Weakness.  EXAM: CT HEAD WITHOUT CONTRAST  TECHNIQUE: Contiguous axial images were obtained from the base of the skull through the vertex without intravenous contrast.  COMPARISON:  12/29/2012  FINDINGS: The ventricles and sulci are within normal limits for age. There is no evidence of acute infarct, intracranial hemorrhage, mass, midline shift, or extra-axial collection. The orbits are unremarkable. The visualized paranasal sinuses and mastoid air cells are clear. There is no evidence of acute fracture.  IMPRESSION: Unremarkable head CT.   Electronically Signed   By: Sebastian Ache   On: 11/17/2013 18:06   Dg Fluoro Guide Ndl Plc/bx  11/18/2013   CLINICAL DATA:  Left knee joint effusion.  EXAM: Left knee joint aspiration  PROCEDURE: Overlying skin prepped with Betadine, draped in the usual sterile fashion, and infiltrated locally with buffered Lidocaine. Local anesthesia was administered through a 25 gauge needle. Joint fluid was easily aspirated into the 25 gauge needle, so the joint aspiration was performed through this needle. 10 cc of slightly cloudy and yellow joint fluid removed and sent to the order labs.  IMPRESSION: Successful left knee joint aspiration.  Sample sent to ordered labs.  FLUOROSCOPY: FLUOROSCOPY : 0 min 2 seconds.   Electronically Signed   By: Maisie Fus  Register   On: 11/18/2013 17:11    Assessment:  He is improving rapidly on empiric antibiotic therapy and prednisone for presumed gout. Although no crystals were seen the auto synovial fluid I suspect this is gout rather than polyarticular septic arthritis. Synovial fluid Gram stain and cultures are negative and blood cultures are negative. I would not expect septic arthritis to improve this  rapidly. If he continues to improve and cultures remain negative will stop empiric antibiotics tomorrow.  Plan: 1.  Consider stopping the antibiotics tomorrow if cultures remain negative  Cliffton Asters, MD [facility] [phone] pager   [phone] cell 11/19/2013, 3:43 PM

## 2013-11-19 NOTE — Progress Notes (Signed)
[facility] PROGRESS NOTE  POSEIDON PAM ZOX:[id number] DOB: [birth year] DOA: 11/17/2013 PCP: No primary provider on file.  Brief history 52 y.o. male with history of hypertension, DM 2, COPD-O2 dependent at 2 L per minute continuously and steroid dependent, angina, gout presented to the ED with the above complaints. Patient states that he was in his usual state of health until approximately 2 weeks ago when he first noticed the gradual onset of pain and swelling in the left knee. Over the next 10-12 days, he had gradual onset of similar complaints in the left ankle, left wrist, left elbow, then right wrist and elbow. The pain is excruciating, worse with movements, difficulty performing activities of daily living and ambulating secondary to pain. He was found to have a temperature 102.15F in the emergency department. Bedside lumbar puncture was unsuccessful x2. The patient refused any further attempts. The patient agreed to arthrocentesis of his left knee. This was performed 11/18/2013. The patient was started on empiric antibiotics. Infectious disease was consulted to see the patient. The patient was started on Naprosyn and low-dose prednisone. He has had significant improvement. Assessment/Plan: FUO with polyarthritis/headache and neck stiffness  -Patient continues to refuse lumbar puncture even after offering LP to be done by interventional radiology  -He expressed understanding of risks including but not limited to worsening infection and death  -doubt meningitis at this point -influenza PCR--neg; blood cultures are negative -Followup HIV RNA  -HIV antibody neg  -Continue empiric antibiotics pending blood cultures  -Chest x-ray negative for infiltrates  -Urinalysis negative pyuria  -significant clinical improvement in polyarthritis with naprosyn and low dose prednisone -continue IVF  -echocardiogram if fever persists  -Sedimentation rate 121  -very low suspicion of tickborne  infection--no epidemiologic evidence  Polyarthritis/synovitis--Acute Gout Flare -Arthrocentesis does not suggest septic arthritis from the cell count -Even though there were no crystals and synovial fluid, this is likely gouty arthritis flare up -Significant clinical improvement with low-dose prednisone and Naprosyn -Even though prednisone is listed on his MAR, he is not taking this at home -uric acid--8.5  -Lower suspicion of septic arthritis as this is a polyarthritis  Dehydration/hyponatremia  -Improving with IV fluids  COPD with continued tobacco use  -stable -Tobacco cessation discussed  -Patient is oxygen and steroid dependent  Hypertension  -Partly due to uncontrolled pain  -Continue antihypertensive regimen from home and monitor  Diabetes mellitus type II, uncontrolled  -Check hemoglobin A1c  -NovoLog sliding scale  History of angina  -Currently chest pain-free  -EKG is reassuring  Family Communication: significant other at bedside today  Disposition Plan: Home  12/19 or 12/20 Antibiotics:  Ceftriaxone 11/17/13>>>  ampicilin 11/17/13>>>11/18/13 vanco 11/17/13>>>         Procedures/Studies: Dg Chest 2 View  11/17/2013   CLINICAL DATA:  Shortness of breath and chest pain  EXAM: CHEST  2 VIEW  COMPARISON:  None.  FINDINGS: Cardiac shadow is stable. Lungs are clear bilaterally. No acute bony abnormality is seen.  IMPRESSION: No acute abnormality noted.   Electronically Signed   By: Alcide Clever M.D.   On: 11/17/2013 17:36   Ct Head Wo Contrast  11/17/2013   CLINICAL DATA:  Headache and neck stiffness.  Weakness.  EXAM: CT HEAD WITHOUT CONTRAST  TECHNIQUE: Contiguous axial images were obtained from the base of the skull through the vertex without intravenous contrast.  COMPARISON:  12/29/2012  FINDINGS: The ventricles and sulci are within normal limits for age. There is no evidence of acute infarct, intracranial  hemorrhage, mass, midline shift, or extra-axial  collection. The orbits are unremarkable. The visualized paranasal sinuses and mastoid air cells are clear. There is no evidence of acute fracture.  IMPRESSION: Unremarkable head CT.   Electronically Signed   By: Sebastian Ache   On: 11/17/2013 18:06   Dg Fluoro Guide Ndl Plc/bx  11/18/2013   CLINICAL DATA:  Left knee joint effusion.  EXAM: Left knee joint aspiration  PROCEDURE: Overlying skin prepped with Betadine, draped in the usual sterile fashion, and infiltrated locally with buffered Lidocaine. Local anesthesia was administered through a 25 gauge needle. Joint fluid was easily aspirated into the 25 gauge needle, so the joint aspiration was performed through this needle. 10 cc of slightly cloudy and yellow joint fluid removed and sent to the order labs.  IMPRESSION: Successful left knee joint aspiration.  Sample sent to ordered labs.  FLUOROSCOPY: FLUOROSCOPY : 0 min 2 seconds.   Electronically Signed   By: Maisie Fus  Register   On: 11/18/2013 17:11         Subjective: Patient is feeling significantly better. Headache has improved. His polyarthritis is over 50% better. Denies any fevers, chills, chest pain, nausea, vomiting, diarrhea, abdominal pain. Has some shortness of breath, but this is unchanged.  Objective: Filed Vitals:   11/19/13 0437 11/19/13 0702 11/19/13 0714 11/19/13 1436  BP: 163/101 140/90 168/110 117/85  Pulse: 71   93  Temp: 97.3 F (36.3 C)   97.9 F (36.6 C)  TempSrc: Oral   Oral  Resp: 24   18  Height:      Weight:      SpO2: 97%   100%    Intake/Output Summary (Last 24 hours) at 11/19/13 1858 Last data filed at 11/19/13 1800  Gross per 24 hour  Intake   5970 ml  Output   2376 ml  Net   3594 ml   Weight change:  Exam:   General:  Pt is alert, follows commands appropriately, not in acute distress  HEENT: No icterus, No thrush,  [place]/AT  Cardiovascular: RRR, S1/S2, no rubs, no gallops  Respiratory: CTA bilaterally, no wheezing, no crackles, no  rhonchi  Abdomen: Soft/+BS, non tender, non distended, no guarding  Extremities: Scattered tophi. Mild synovitis of the left knee, left elbow and right elbow. No crepitance. No lymphangitis.  Data Reviewed: Basic Metabolic Panel:  Recent Labs Lab 11/17/13 1740 11/18/13 0530 11/19/13 0506  NA 133* 136 134*  K 3.3* 3.8 4.0  CL 94* 97 99  CO2 26 25 24   GLUCOSE 167* 191* 152*  BUN 7 9 10   CREATININE 0.87 0.94 0.81  CALCIUM 9.7 9.3 9.3   Liver Function Tests:  Recent Labs Lab 11/18/13 0530 11/19/13 0506  AST 14 52*  ALT 9 31  ALKPHOS 71 84  BILITOT 0.6 0.3  PROT 7.5 7.0  ALBUMIN 2.8* 2.5*   No results found for this basename: LIPASE, AMYLASE,  in the last 168 hours No results found for this basename: AMMONIA,  in the last 168 hours CBC:  Recent Labs Lab 11/17/13 1740 11/18/13 0530 11/19/13 0506  WBC 10.8* 11.7* 11.1*  NEUTROABS 7.8*  --  8.0*  HGB 11.4* 11.1* 9.8*  HCT 35.1* 33.4* 30.5*  MCV 88.6 89.5 91.3  PLT 448* 427* 448*   Cardiac Enzymes:  Recent Labs Lab 11/17/13 1740  TROPONINI <0.30   BNP: No components found with this basename: POCBNP,  CBG:  Recent Labs Lab 11/18/13 1656 11/18/13 2032 11/19/13 0740 11/19/13 1159  11/19/13 1706  GLUCAP 184* 153* 130* 154* 202*    Recent Results (from the past 240 hour(s))  URINE CULTURE     Status: None   Collection Time    11/17/13  6:12 PM      Result Value Range Status   Specimen Description URINE, CLEAN CATCH   Final   Special Requests NONE   Final   Culture  Setup Time     Final   Value: 11/18/2013 01:26     Performed at [facility] Count     Final   Value: NO GROWTH     Performed at [facility]   Culture     Final   Value: NO GROWTH     Performed at [facility]   Report Status 11/18/2013 FINAL   Final  CULTURE, BLOOD (ROUTINE X 2)     Status: None   Collection Time    11/17/13  6:50 PM      Result Value Range Status   Specimen Description BLOOD  RIGHT HAND   Final   Special Requests BOTTLES DRAWN AEROBIC AND ANAEROBIC 3CC   Final   Culture  Setup Time     Final   Value: 11/17/2013 22:18     Performed at [facility]   Culture     Final   Value:        BLOOD CULTURE RECEIVED NO GROWTH TO DATE CULTURE WILL BE HELD FOR 5 DAYS BEFORE ISSUING A FINAL NEGATIVE REPORT     Performed at [facility]   Report Status PENDING   Incomplete  CULTURE, BLOOD (ROUTINE X 2)     Status: None   Collection Time    11/17/13  6:55 PM      Result Value Range Status   Specimen Description BLOOD LEFT ARM   Final   Special Requests BOTTLES DRAWN AEROBIC AND ANAEROBIC 5CC   Final   Culture  Setup Time     Final   Value: 11/17/2013 22:18     Performed at [facility]   Culture     Final   Value:        BLOOD CULTURE RECEIVED NO GROWTH TO DATE CULTURE WILL BE HELD FOR 5 DAYS BEFORE ISSUING A FINAL NEGATIVE REPORT     Performed at [facility]   Report Status PENDING   Incomplete  BODY FLUID CULTURE     Status: None   Collection Time    11/18/13  2:40 PM      Result Value Range Status   Specimen Description KNEE LEFT   Final   Special Requests NONE   Final   Gram Stain     Final   Value: CYTOSPIN WBC PRESENT, PREDOMINANTLY PMN     NO ORGANISMS SEEN     GSRC ZAPPIA J AT 1724 ON 11/18/13 BY POTEAT S     Performed at [facility]   Culture     Final   Value: NO GROWTH     Performed at [facility]   Report Status PENDING   Incomplete  GRAM STAIN     Status: None   Collection Time    11/18/13  2:40 PM      Result Value Range Status   Specimen Description SYNOVIAL   Final   Special Requests KNEE   Final   Gram Stain     Final   Value: NO ORGANISMS SEEN  WBC PRESENT, PREDOMINANTLY PMN     Gram Stain Report Called to,Read Back By and Verified With: ZAPPIA,J AT 1724 ON 121714 BY POTEAT,S     CYTOSPUN   Report Status 11/18/2013 FINAL   Final     Scheduled Meds: . amLODipine  2.5 mg Oral q  morning - 10a  . atenolol  100 mg Oral q morning - 10a  . cefTRIAXone (ROCEPHIN)  IV  2 g Intravenous Q12H  . famotidine  20 mg Oral BID  . heparin  5,000 Units Subcutaneous Q8H  . insulin aspart  0-5 Units Subcutaneous QHS  . insulin aspart  0-9 Units Subcutaneous TID WC  . losartan  100 mg Oral q morning - 10a  . naproxen  500 mg Oral BID WC  . predniSONE  5 mg Oral Q breakfast  . simvastatin  20 mg Oral q1800  . sodium chloride  3 mL Intravenous Q12H  . vancomycin  1,000 mg Intravenous Q8H   Continuous Infusions: . 0.9 % NaCl with KCl 40 mEq / L 150 mL/hr at 11/19/13 1551     Abisola Carrero, DO  [facility] Pager [phone]  If 7PM-7AM, please contact night-coverage [email or web address] Password [email or web address] 11/19/2013, 6:58 PM   LOS: 2 days

## 2013-11-20 LAB — CBC
HCT: 29.1 % — ABNORMAL LOW (ref 39.0–52.0)
MCH: 29.9 pg (ref 26.0–34.0)
MCHC: 32.6 g/dL (ref 30.0–36.0)
MCV: 91.5 fL (ref 78.0–100.0)
RDW: 15.6 % — ABNORMAL HIGH (ref 11.5–15.5)

## 2013-11-20 LAB — BASIC METABOLIC PANEL
BUN: 10 mg/dL (ref 6–23)
CO2: 26 mEq/L (ref 19–32)
Chloride: 101 mEq/L (ref 96–112)
Creatinine, Ser: 0.84 mg/dL (ref 0.50–1.35)
Glucose, Bld: 159 mg/dL — ABNORMAL HIGH (ref 70–99)

## 2013-11-20 LAB — GLUCOSE, CAPILLARY: Glucose-Capillary: 139 mg/dL — ABNORMAL HIGH (ref 70–99)

## 2013-11-20 MED ORDER — PREDNISONE 5 MG PO TABS: 5.0000 mg | ORAL_TABLET | Freq: Every day | ORAL | Status: DC

## 2013-11-20 MED ORDER — NAPROXEN 500 MG PO TABS: 500.0000 mg | ORAL_TABLET | Freq: Two times a day (BID) | ORAL | Status: DC

## 2013-11-20 NOTE — Progress Notes (Signed)
Pt left at 1630 with his bus ticket in hand. Staff aide walked pt to bus station. Pt alert, oriented, and without c/o. Discharge instructions given and prescription pickup noted. Followup appointment to be made by pt. (this was encouraged).

## 2013-11-20 NOTE — ED Provider Notes (Signed)
Medical screening examination/treatment/procedure(s) were conducted as a shared visit with non-physician practitioner(s) and myself.  I personally evaluated the patient during the encounter.  EKG Interpretation    Date/Time:  Tuesday November 17 2013 16:32:30 EST Ventricular Rate:  82 PR Interval:  142 QRS Duration: 88 QT Interval:  372 QTC Calculation: 434 R Axis:   52 Text Interpretation:  Sinus rhythm Ventricular premature complex Aberrant conduction of SV complex(es) Borderline T wave abnormalities Minimal ST elevation, anterior leads No STEMI Confirmed by Gwendolyn Grant  MD, Liyla Radliff ([id number]) on 11/17/2013 5:07:55 PM            Patient here with headache, neck stiffness, generalized weakness. On exam, unable to turn neck. HEENT exam normal, TM exam normal. Belly benign. Febrile, mild tachycardia. Concern for possible meningitis. Antibiotics ordered - Vanc/Rocephin. Cultures, labs ordered. LP unsucessful. Attempted to obtain fluoro guided LP, however patient refused. Admitted to medicine.   Dagmar Hait, MD 11/20/13 [phone]

## 2013-11-20 NOTE — Discharge Summary (Signed)
Physician Discharge Summary  Darryl Hurley:[id number] DOB: November 15, 1961 DOA: 11/17/2013  PCP: No primary provider on file.  Admit date: 11/17/2013 Discharge date: 11/20/2013  Time spent: 45 minutes  Recommendations for Outpatient Follow-up:  -Will be discharged home today. -Advised to follow up with his PCP in 2 weeks.   Discharge Diagnoses:  Principal Problem:   Fever Active Problems:   Polyarthritis   TOBACCO ABUSE   Severe uncontrolled hypertension   Chronic obstructive pulmonary disease (COPD)   Gout   Headache   Type 2 diabetes mellitus   Anemia   Hypokalemia   Dehydration   Cocaine abuse   Discharge Condition: Stable and much improved  Filed Weights   11/17/13 2007 11/17/13 2116  Weight: 85.3 kg (188 lb 0.8 oz) 85.2 kg (187 lb 13.3 oz)    History of present illness:  Darryl Hurley is a 52 y.o. male with history of hypertension, DM 2, COPD-O2 dependent at 2 L per minute continuously and steroid dependent, angina, gout presented to the ED with the above complaints. Patient states that he was in his usual state of health until approximately 2 weeks ago when he first noticed the gradual onset of pain and swelling in the left knee. Over the next 10-12 days, he had gradual onset of similar complaints in the left ankle, left wrist, left elbow, then right wrist and elbow. The pain is excruciating, worse with movements, difficulty performing activities of daily living and ambulating secondary to pain. Over the last 2-3 days, he has noted gradual onset of global headache, throbbing, moderate in intensity, not associated with photophobia, associated with neck pain and difficulty moving neck. He denies earache, sore throat, runny nose, cough, dyspnea, chest pain, nausea, vomiting, abdominal pain, diarrhea, urinary frequency or dysuria. No skin rashes. He denies sickly contacts, recent travel, exposure to animals, birds or pets. He has taken the flu shot this season. He states  that his joint pains are similar to prior episodes of gouty arthritis-indicates that many years ago he had a joint aspiration done which confirmed gout. In the ED, MAXIMUM TEMPERATURE 102F, sodium 133, potassium 3.3, glucose 167, WBC 10.8, hemoglobin 11.4, troponin negative, chest x-ray and CT head. EDP attempted LP multiple times but was unsuccessful. IR was consulted by ED for same but patient has since refused LP. Hospitalist admission requested.   Hospital Course:   Fever/Polyarthralgias/HA/Stiff Neck -All resolved. -Agree with ID that meningitis is extremely unlikely and all abx therapy has been discontinued. -His knee aspirate only had 18,000 WBC which is not highly suggestive of septic arthritis. -His rapid response to prednisone and NSAIDS makes polyarticular gout more likely, despite no crystals being seen on aspirate. -DC on prednisone 5 mg for 3 days and then continue home regimen of 2.5 mg daily (unsure as to why he uses daily steroids (?COPD) and PCP will need to figure this out on follow up visit).  COPD -Oxygen dependant. -Well controlled.  Rest of chronic medical conditions have been stable this admission.  Procedures:  Left Knee aspiration.   Consultations:  ID, Dr. Orvan Falconer  Discharge Instructions  Discharge Orders   Future Orders Complete By Expires   Discontinue IV  As directed    Increase activity slowly  As directed        Medication List         albuterol (5 MG/ML) 0.5% nebulizer solution  Commonly known as:  PROVENTIL  Take 2.5 mg by nebulization every 6 (six) hours as needed  for shortness of breath.     amLODipine 2.5 MG tablet  Commonly known as:  NORVASC  Take 2.5 mg by mouth every morning.     atenolol 100 MG tablet  Commonly known as:  TENORMIN  Take 100 mg by mouth every morning.     losartan 100 MG tablet  Commonly known as:  COZAAR  Take 100 mg by mouth every morning.     metFORMIN 500 MG 24 hr tablet  Commonly known as:   GLUCOPHAGE-XR  Take 500 mg by mouth every evening.     naproxen 500 MG tablet  Commonly known as:  NAPROSYN  Take 1 tablet (500 mg total) by mouth 2 (two) times daily with a meal.     nitroGLYCERIN 0.4 MG SL tablet  Commonly known as:  NITROSTAT  Place 0.4 mg under the tongue every 5 (five) minutes as needed for chest pain (x2 doses).     pravastatin 40 MG tablet  Commonly known as:  PRAVACHOL  Take 40 mg by mouth every evening.     predniSONE 5 MG tablet  Commonly known as:  DELTASONE  Take 1 tablet (5 mg total) by mouth daily with breakfast. For 3 days, and then take half a tablet daily until seen by your PCP.       No Known Allergies     Follow-up Information   Follow up with Standley Dakins, MD. Schedule an appointment as soon as possible for a visit in 2 weeks.   Specialty:  Family Medicine   Contact information:   [address] [place] [address] [phone]        The results of significant diagnostics from this hospitalization (including imaging, microbiology, ancillary and laboratory) are listed below for reference.    Significant Diagnostic Studies: Dg Chest 2 View  11/17/2013   CLINICAL DATA:  Shortness of breath and chest pain  EXAM: CHEST  2 VIEW  COMPARISON:  None.  FINDINGS: Cardiac shadow is stable. Lungs are clear bilaterally. No acute bony abnormality is seen.  IMPRESSION: No acute abnormality noted.   Electronically Signed   By: Alcide Clever M.D.   On: 11/17/2013 17:36   Ct Head Wo Contrast  11/17/2013   CLINICAL DATA:  Headache and neck stiffness.  Weakness.  EXAM: CT HEAD WITHOUT CONTRAST  TECHNIQUE: Contiguous axial images were obtained from the base of the skull through the vertex without intravenous contrast.  COMPARISON:  12/29/2012  FINDINGS: The ventricles and sulci are within normal limits for age. There is no evidence of acute infarct, intracranial hemorrhage, mass, midline shift, or extra-axial collection. The orbits are  unremarkable. The visualized paranasal sinuses and mastoid air cells are clear. There is no evidence of acute fracture.  IMPRESSION: Unremarkable head CT.   Electronically Signed   By: Sebastian Ache   On: 11/17/2013 18:06   Dg Fluoro Guide Ndl Plc/bx  11/18/2013   CLINICAL DATA:  Left knee joint effusion.  EXAM: Left knee joint aspiration  PROCEDURE: Overlying skin prepped with Betadine, draped in the usual sterile fashion, and infiltrated locally with buffered Lidocaine. Local anesthesia was administered through a 25 gauge needle. Joint fluid was easily aspirated into the 25 gauge needle, so the joint aspiration was performed through this needle. 10 cc of slightly cloudy and yellow joint fluid removed and sent to the order labs.  IMPRESSION: Successful left knee joint aspiration.  Sample sent to ordered labs.  FLUOROSCOPY: FLUOROSCOPY : 0 min 2 seconds.  Electronically Signed   By: Maisie Fus  Register   On: 11/18/2013 17:11    Microbiology: Recent Results (from the past 240 hour(s))  URINE CULTURE     Status: None   Collection Time    11/17/13  6:12 PM      Result Value Range Status   Specimen Description URINE, CLEAN CATCH   Final   Special Requests NONE   Final   Culture  Setup Time     Final   Value: 11/18/2013 01:26     Performed at [facility] Count     Final   Value: NO GROWTH     Performed at [facility]   Culture     Final   Value: NO GROWTH     Performed at [facility]   Report Status 11/18/2013 FINAL   Final  CULTURE, BLOOD (ROUTINE X 2)     Status: None   Collection Time    11/17/13  6:50 PM      Result Value Range Status   Specimen Description BLOOD RIGHT HAND   Final   Special Requests BOTTLES DRAWN AEROBIC AND ANAEROBIC 3CC   Final   Culture  Setup Time     Final   Value: 11/17/2013 22:18     Performed at [facility]   Culture     Final   Value:        BLOOD CULTURE RECEIVED NO GROWTH TO DATE CULTURE WILL BE HELD FOR 5 DAYS  BEFORE ISSUING A FINAL NEGATIVE REPORT     Performed at [facility]   Report Status PENDING   Incomplete  CULTURE, BLOOD (ROUTINE X 2)     Status: None   Collection Time    11/17/13  6:55 PM      Result Value Range Status   Specimen Description BLOOD LEFT ARM   Final   Special Requests BOTTLES DRAWN AEROBIC AND ANAEROBIC 5CC   Final   Culture  Setup Time     Final   Value: 11/17/2013 22:18     Performed at [facility]   Culture     Final   Value:        BLOOD CULTURE RECEIVED NO GROWTH TO DATE CULTURE WILL BE HELD FOR 5 DAYS BEFORE ISSUING A FINAL NEGATIVE REPORT     Performed at [facility]   Report Status PENDING   Incomplete  BODY FLUID CULTURE     Status: None   Collection Time    11/18/13  2:40 PM      Result Value Range Status   Specimen Description KNEE LEFT   Final   Special Requests NONE   Final   Gram Stain     Final   Value: CYTOSPIN WBC PRESENT, PREDOMINANTLY PMN     NO ORGANISMS SEEN     GSRC ZAPPIA J AT 1724 ON 11/18/13 BY POTEAT S     Performed at [facility]   Culture     Final   Value: NO GROWTH 1 DAY     Performed at [facility]   Report Status PENDING   Incomplete  GRAM STAIN     Status: None   Collection Time    11/18/13  2:40 PM      Result Value Range Status   Specimen Description SYNOVIAL   Final   Special Requests KNEE   Final   Gram Stain  Final   Value: NO ORGANISMS SEEN     WBC PRESENT, PREDOMINANTLY PMN     Gram Stain Report Called to,Read Back By and Verified With: ZAPPIA,J AT 1724 ON 121714 BY POTEAT,S     CYTOSPUN   Report Status 11/18/2013 FINAL   Final     Labs: Basic Metabolic Panel:  Recent Labs Lab 11/17/13 1740 11/18/13 0530 11/19/13 0506 11/20/13 0504  NA 133* 136 134* 137  K 3.3* 3.8 4.0 4.4  CL 94* 97 99 101  CO2 26 25 24 26   GLUCOSE 167* 191* 152* 159*  BUN 7 9 10 10   CREATININE 0.87 0.94 0.81 0.84  CALCIUM 9.7 9.3 9.3 9.5   Liver Function Tests:  Recent Labs Lab  11/18/13 0530 11/19/13 0506  AST 14 52*  ALT 9 31  ALKPHOS 71 84  BILITOT 0.6 0.3  PROT 7.5 7.0  ALBUMIN 2.8* 2.5*   No results found for this basename: LIPASE, AMYLASE,  in the last 168 hours No results found for this basename: AMMONIA,  in the last 168 hours CBC:  Recent Labs Lab 11/17/13 1740 11/18/13 0530 11/19/13 0506 11/20/13 0504  WBC 10.8* 11.7* 11.1* 8.5  NEUTROABS 7.8*  --  8.0*  --   HGB 11.4* 11.1* 9.8* 9.5*  HCT 35.1* 33.4* 30.5* 29.1*  MCV 88.6 89.5 91.3 91.5  PLT 448* 427* 448* 486*   Cardiac Enzymes:  Recent Labs Lab 11/17/13 1740  TROPONINI <0.30   BNP: BNP (last 3 results)  Recent Labs  01/19/13 1604 08/09/13 0037 10/20/13 0129  PROBNP 365.2* 106.8 152.2*   CBG:  Recent Labs Lab 11/19/13 1159 11/19/13 1706 11/19/13 2112 11/20/13 0722 11/20/13 1153  GLUCAP 154* 202* 186* 139* 159*       Signed:  HERNANDEZ ACOSTA,ESTELA  [facility] Pager: [phone] 11/20/2013, 2:56 PM

## 2013-11-20 NOTE — Progress Notes (Signed)
Patient ID: Darryl Hurley, male   DOB: 1961/05/10, 52 y.o.   MRN: [id number]         [facility]    Date of Admission:  11/17/2013   Total days of antibiotics 3         Principal Problem:   Fever Active Problems:   Headache   Polyarthritis   TOBACCO ABUSE   Severe uncontrolled hypertension   Chronic obstructive pulmonary disease (COPD)   Gout   Type 2 diabetes mellitus   Anemia   Hypokalemia   Dehydration   Cocaine abuse   . amLODipine  2.5 mg Oral q morning - 10a  . atenolol  100 mg Oral q morning - 10a  . cefTRIAXone (ROCEPHIN)  IV  2 g Intravenous Q12H  . famotidine  20 mg Oral BID  . heparin  5,000 Units Subcutaneous Q8H  . insulin aspart  0-5 Units Subcutaneous QHS  . insulin aspart  0-9 Units Subcutaneous TID WC  . losartan  100 mg Oral q morning - 10a  . naproxen  500 mg Oral BID WC  . predniSONE  5 mg Oral Q breakfast  . simvastatin  20 mg Oral q1800  . sodium chloride  3 mL Intravenous Q12H  . vancomycin  1,000 mg Intravenous Q8H    Subjective: He is feeling much better with less pain in his joints. He is eager to go home.  Objective: Temp:  [98 F (36.7 C)-98.5 F (36.9 C)] 98 F (36.7 C) (12/19 1432) Pulse Rate:  [74-89] 74 (12/19 1432) Resp:  [16-18] 16 (12/19 1432) BP: (121-146)/(80-87) 146/81 mmHg (12/19 1432) SpO2:  [99 %-100 %] 99 % (12/19 1432)  General: He is sitting up on the side of the bed in no distress Diffuse joint swelling is markedly improved  Lab Results Lab Results  Component Value Date   WBC 8.5 11/20/2013   HGB 9.5* 11/20/2013   HCT 29.1* 11/20/2013   MCV 91.5 11/20/2013   PLT 486* 11/20/2013    Lab Results  Component Value Date   CREATININE 0.84 11/20/2013   BUN 10 11/20/2013   NA 137 11/20/2013   K 4.4 11/20/2013   CL 101 11/20/2013   CO2 26 11/20/2013    Lab Results  Component Value Date   ALT 31 11/19/2013   AST 52* 11/19/2013   ALKPHOS 84 11/19/2013   BILITOT 0.3 11/19/2013        Microbiology: Recent Results (from the past 240 hour(s))  URINE CULTURE     Status: None   Collection Time    11/17/13  6:12 PM      Result Value Range Status   Specimen Description URINE, CLEAN CATCH   Final   Special Requests NONE   Final   Culture  Setup Time     Final   Value: 11/18/2013 01:26     Performed at [facility] Count     Final   Value: NO GROWTH     Performed at [facility]   Culture     Final   Value: NO GROWTH     Performed at [facility]   Report Status 11/18/2013 FINAL   Final  CULTURE, BLOOD (ROUTINE X 2)     Status: None   Collection Time    11/17/13  6:50 PM      Result Value Range Status   Specimen Description BLOOD RIGHT HAND   Final  Special Requests BOTTLES DRAWN AEROBIC AND ANAEROBIC 3CC   Final   Culture  Setup Time     Final   Value: 11/17/2013 22:18     Performed at [facility]   Culture     Final   Value:        BLOOD CULTURE RECEIVED NO GROWTH TO DATE CULTURE WILL BE HELD FOR 5 DAYS BEFORE ISSUING A FINAL NEGATIVE REPORT     Performed at [facility]   Report Status PENDING   Incomplete  CULTURE, BLOOD (ROUTINE X 2)     Status: None   Collection Time    11/17/13  6:55 PM      Result Value Range Status   Specimen Description BLOOD LEFT ARM   Final   Special Requests BOTTLES DRAWN AEROBIC AND ANAEROBIC 5CC   Final   Culture  Setup Time     Final   Value: 11/17/2013 22:18     Performed at [facility]   Culture     Final   Value:        BLOOD CULTURE RECEIVED NO GROWTH TO DATE CULTURE WILL BE HELD FOR 5 DAYS BEFORE ISSUING A FINAL NEGATIVE REPORT     Performed at [facility]   Report Status PENDING   Incomplete  BODY FLUID CULTURE     Status: None   Collection Time    11/18/13  2:40 PM      Result Value Range Status   Specimen Description KNEE LEFT   Final   Special Requests NONE   Final   Gram Stain     Final   Value: CYTOSPIN WBC PRESENT, PREDOMINANTLY PMN      NO ORGANISMS SEEN     GSRC ZAPPIA J AT 1724 ON 11/18/13 BY POTEAT S     Performed at [facility]   Culture     Final   Value: NO GROWTH 1 DAY     Performed at [facility]   Report Status PENDING   Incomplete  GRAM STAIN     Status: None   Collection Time    11/18/13  2:40 PM      Result Value Range Status   Specimen Description SYNOVIAL   Final   Special Requests KNEE   Final   Gram Stain     Final   Value: NO ORGANISMS SEEN     WBC PRESENT, PREDOMINANTLY PMN     Gram Stain Report Called to,Read Back By and Verified With: ZAPPIA,J AT 1724 ON 121714 BY POTEAT,S     CYTOSPUN   Report Status 11/18/2013 FINAL   Final    Studies/Results: No results found.  Assessment: He has had a dramatic response to increasing prednisone for a probable flare of polyarticular gout. Polyarticular septic arthritis it is extremely unlikely. I will stop his antibiotics.  Plan: 1. Discontinue vancomycin and ceftriaxone 2. Recommend discharge home today  Cliffton Asters, MD [facility] [phone] pager   [phone] cell 11/20/2013, 2:49 PM

## 2013-11-20 NOTE — Progress Notes (Signed)
   CARE MANAGEMENT NOTE 11/20/2013  Patient:  Darryl Hurley, Darryl Hurley   Account Number:  [id number]  Date Initiated:  11/18/2013  Documentation initiated by:  Lanier Clam  Subjective/Objective Assessment:   52 Y/O M ADMITTED W/FEVER.     Action/Plan:   FROM HOME.   Anticipated DC Date:  11/20/2013   Anticipated DC Plan:  HOME/SELF CARE      DC Planning Services  CM consult      Choice offered to / List presented to:             Status of service:  Completed, signed off Medicare Important Message given?   (If response is "NO", the following Medicare IM given date fields will be blank) Date Medicare IM given:   Date Additional Medicare IM given:    Discharge Disposition:  HOME/SELF CARE  Per UR Regulation:  Reviewed for med. necessity/level of care/duration of stay  If discussed at Long Length of Stay Meetings, dates discussed:    Comments:

## 2013-11-22 LAB — BODY FLUID CULTURE

## 2013-11-23 LAB — CULTURE, BLOOD (ROUTINE X 2): Culture: NO GROWTH

## 2014-01-17 ENCOUNTER — Emergency Department (HOSPITAL_COMMUNITY)
Admission: EM | Admit: 2014-01-17 | Discharge: 2014-01-18 | Disposition: A | Discharge status: Home / Self Care | Payer: Medicaid Other | Attending: Emergency Medicine | Admitting: Emergency Medicine

## 2014-01-17 ENCOUNTER — Encounter (HOSPITAL_COMMUNITY): Payer: Self-pay | Admitting: Emergency Medicine

## 2014-01-17 DIAGNOSIS — F1410 Cocaine abuse, uncomplicated: Secondary | ICD-10-CM | POA: Insufficient documentation

## 2014-01-17 DIAGNOSIS — I209 Angina pectoris, unspecified: Secondary | ICD-10-CM | POA: Insufficient documentation

## 2014-01-17 DIAGNOSIS — J449 Chronic obstructive pulmonary disease, unspecified: Secondary | ICD-10-CM | POA: Insufficient documentation

## 2014-01-17 DIAGNOSIS — E119 Type 2 diabetes mellitus without complications: Secondary | ICD-10-CM | POA: Insufficient documentation

## 2014-01-17 DIAGNOSIS — G8929 Other chronic pain: Secondary | ICD-10-CM | POA: Insufficient documentation

## 2014-01-17 DIAGNOSIS — Z8739 Personal history of other diseases of the musculoskeletal system and connective tissue: Secondary | ICD-10-CM | POA: Insufficient documentation

## 2014-01-17 DIAGNOSIS — F17200 Nicotine dependence, unspecified, uncomplicated: Secondary | ICD-10-CM | POA: Insufficient documentation

## 2014-01-17 DIAGNOSIS — Z8701 Personal history of pneumonia (recurrent): Secondary | ICD-10-CM | POA: Insufficient documentation

## 2014-01-17 DIAGNOSIS — J4489 Other specified chronic obstructive pulmonary disease: Secondary | ICD-10-CM | POA: Insufficient documentation

## 2014-01-17 DIAGNOSIS — F1910 Other psychoactive substance abuse, uncomplicated: Secondary | ICD-10-CM

## 2014-01-17 DIAGNOSIS — Z9981 Dependence on supplemental oxygen: Secondary | ICD-10-CM | POA: Insufficient documentation

## 2014-01-17 DIAGNOSIS — Z79899 Other long term (current) drug therapy: Secondary | ICD-10-CM | POA: Insufficient documentation

## 2014-01-17 DIAGNOSIS — F1010 Alcohol abuse, uncomplicated: Secondary | ICD-10-CM | POA: Insufficient documentation

## 2014-01-17 DIAGNOSIS — I10 Essential (primary) hypertension: Secondary | ICD-10-CM | POA: Insufficient documentation

## 2014-01-17 NOTE — ED Notes (Signed)
Presents with altered mental status and pinpoint pupils, per Friends and family pt has not been acting like himself all day. Admits to ETOH use and taking a handful of medications not to intentionally harm self. Pt is answering questions, but is confused.  Slurred speech noted, empty liquor bottles found.

## 2014-01-17 NOTE — ED Notes (Signed)
Pt stated wife gave him handful full of pills and he took them, he did not know what they are Pt stated  " I think my wife is trying to kill me". EMS  stated the neighbor said the same thing.  Pt oriented to name, place not not time or events.

## 2014-01-18 ENCOUNTER — Emergency Department (HOSPITAL_COMMUNITY): Payer: Medicaid Other

## 2014-01-18 LAB — CBC
HCT: 38.2 % — ABNORMAL LOW (ref 39.0–52.0)
HEMOGLOBIN: 13.2 g/dL (ref 13.0–17.0)
MCH: 30.5 pg (ref 26.0–34.0)
MCHC: 34.6 g/dL (ref 30.0–36.0)
MCV: 88.2 fL (ref 78.0–100.0)
PLATELETS: 357 10*3/uL (ref 150–400)
RBC: 4.33 MIL/uL (ref 4.22–5.81)
RDW: 16.1 % — ABNORMAL HIGH (ref 11.5–15.5)
WBC: 10.4 10*3/uL (ref 4.0–10.5)

## 2014-01-18 LAB — COMPREHENSIVE METABOLIC PANEL
ALT: 17 U/L (ref 0–53)
AST: 24 U/L (ref 0–37)
Albumin: 3.3 g/dL — ABNORMAL LOW (ref 3.5–5.2)
Alkaline Phosphatase: 70 U/L (ref 39–117)
BILIRUBIN TOTAL: 0.4 mg/dL (ref 0.3–1.2)
BUN: 24 mg/dL — AB (ref 6–23)
CALCIUM: 9 mg/dL (ref 8.4–10.5)
CHLORIDE: 96 meq/L (ref 96–112)
CO2: 17 mEq/L — ABNORMAL LOW (ref 19–32)
CREATININE: 1.17 mg/dL (ref 0.50–1.35)
GFR calc Af Amer: 82 mL/min — ABNORMAL LOW (ref >90)
GFR calc non Af Amer: 71 mL/min — ABNORMAL LOW (ref >90)
Glucose, Bld: 213 mg/dL — ABNORMAL HIGH (ref 70–99)
Potassium: 3.9 mEq/L (ref 3.7–5.3)
Sodium: 136 mEq/L — ABNORMAL LOW (ref 137–147)
Total Protein: 7.9 g/dL (ref 6.0–8.3)

## 2014-01-18 LAB — GLUCOSE, CAPILLARY: Glucose-Capillary: 229 mg/dL — ABNORMAL HIGH (ref 70–99)

## 2014-01-18 LAB — AMMONIA: Ammonia: 27 umol/L (ref 11–60)

## 2014-01-18 LAB — RAPID URINE DRUG SCREEN, HOSP PERFORMED
Amphetamines: NOT DETECTED
BENZODIAZEPINES: NOT DETECTED
Barbiturates: NOT DETECTED
Cocaine: POSITIVE — AB
OPIATES: NOT DETECTED
TETRAHYDROCANNABINOL: NOT DETECTED

## 2014-01-18 LAB — TROPONIN I: Troponin I: <0.3 ng/mL (ref <0.30)

## 2014-01-18 LAB — ETHANOL: Alcohol, Ethyl (B): 154 mg/dL — ABNORMAL HIGH (ref 0–11)

## 2014-01-18 IMAGING — CT CT HEAD W/O CM
2 series · 16 of 30 positions shown, 18 images · non-contrast
Comparison: Prior CT from [DATE]

CLINICAL DATA: Altered mental status

EXAM:
CT HEAD WITHOUT CONTRAST
TECHNIQUE: Contiguous axial images were obtained from the base of the skull
through the vertex without intravenous contrast.

[Series 3: head w/o · axial · non-contrast · 0.49mm/px · z∈[+130,+250]mm · 8 of 32 slices shown, 10 images]
[im 4/32  brain]
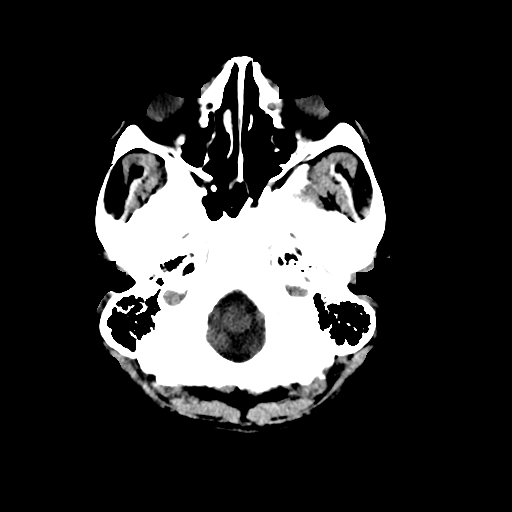
[im 4/32  bone]
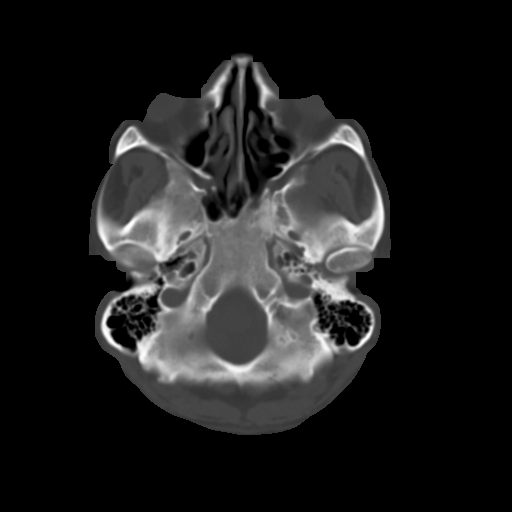
[im 7/32  brain]
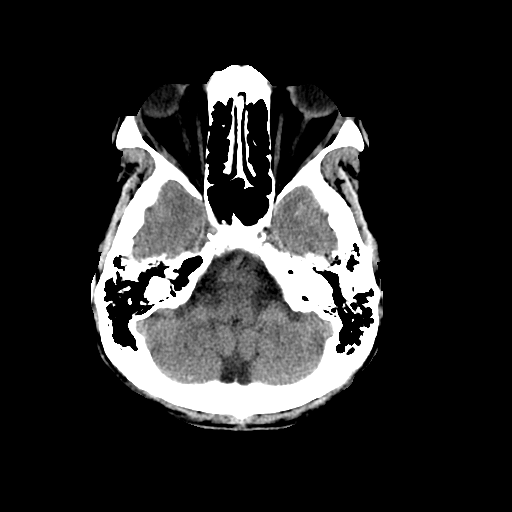
[im 11/32  brain]
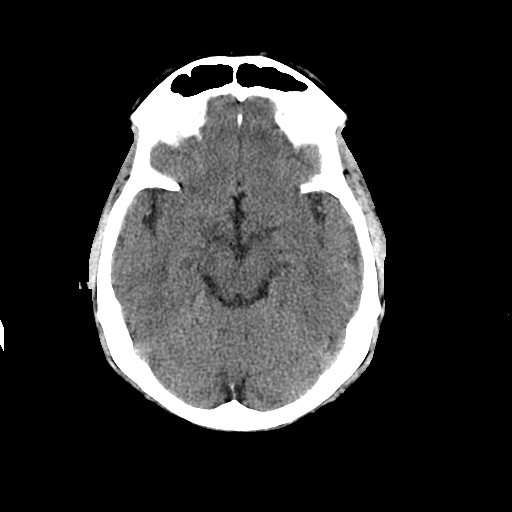
[im 14/32  brain]
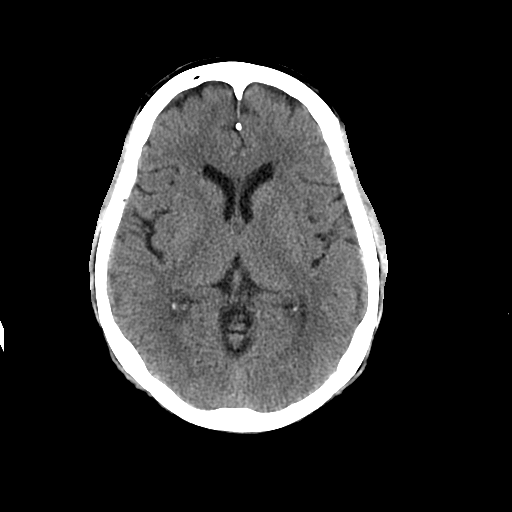
[im 18/32  brain]
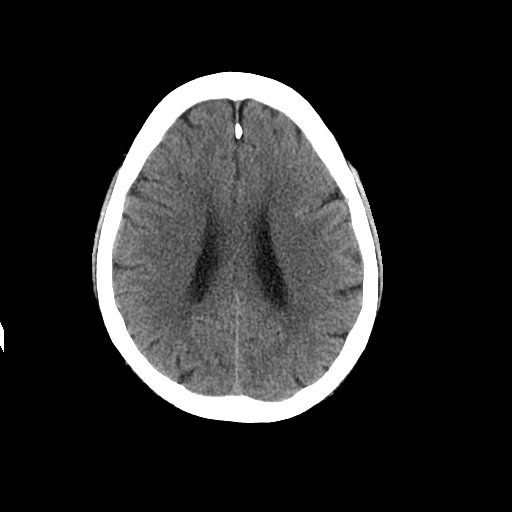
[im 18/32  bone]
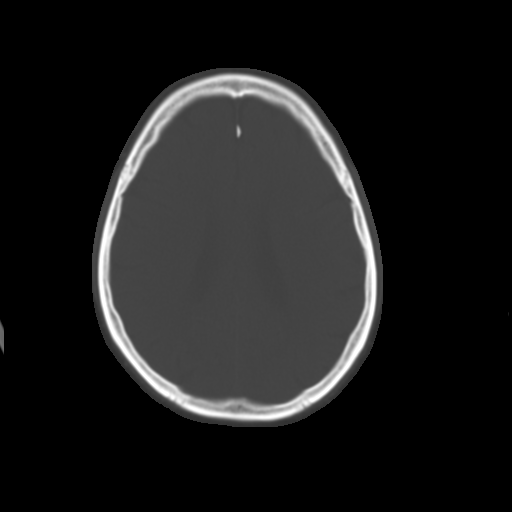
[im 21/32  brain]
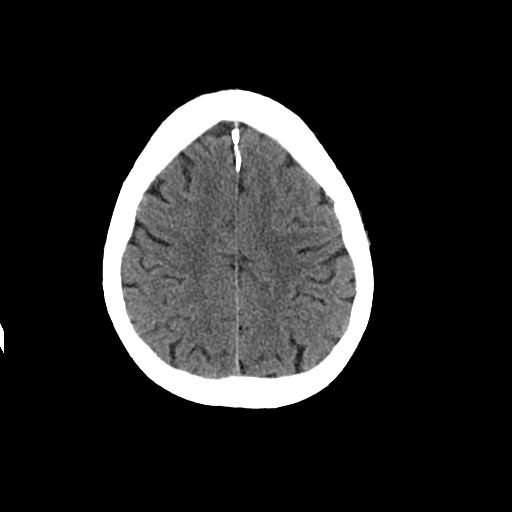
[im 25/32  brain]
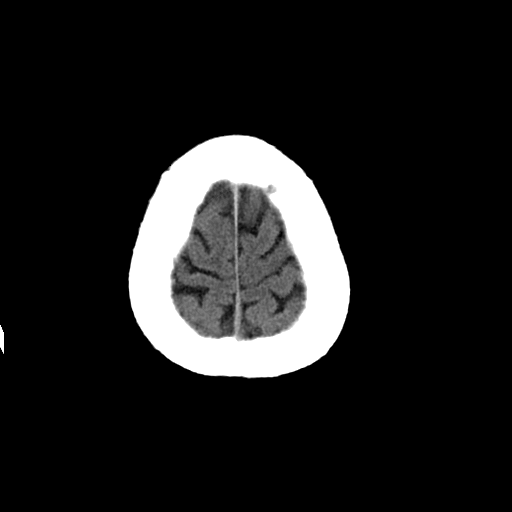
[im 28/32  brain]
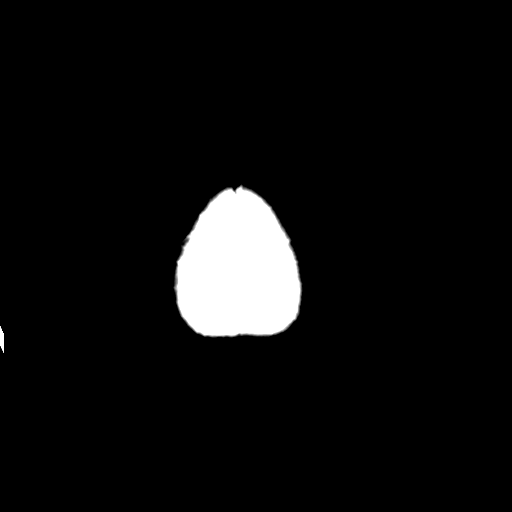

[Series 4: head w/o bone · axial · non-contrast · 0.49mm/px · z∈[+130,+252]mm · 8 of 63 slices shown]
[im 7/63  bone]
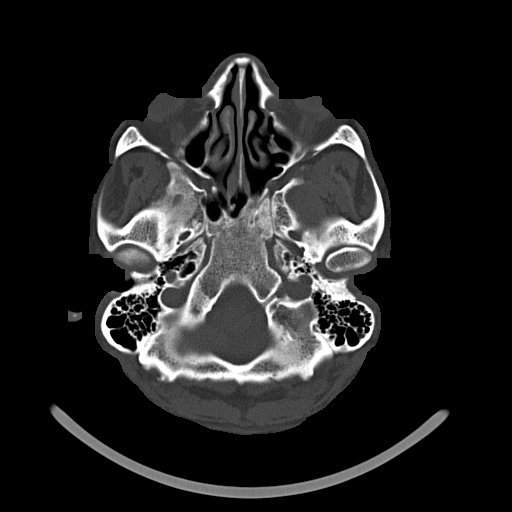
[im 14/63  bone]
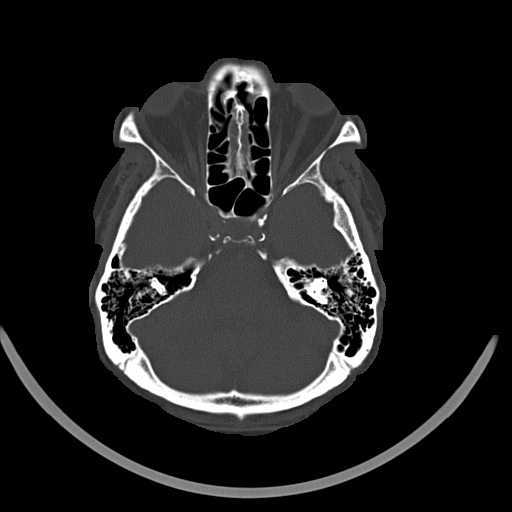
[im 20/63  bone]
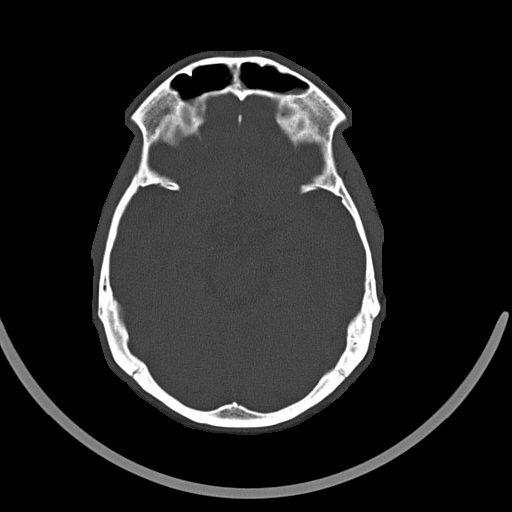
[im 27/63  bone]
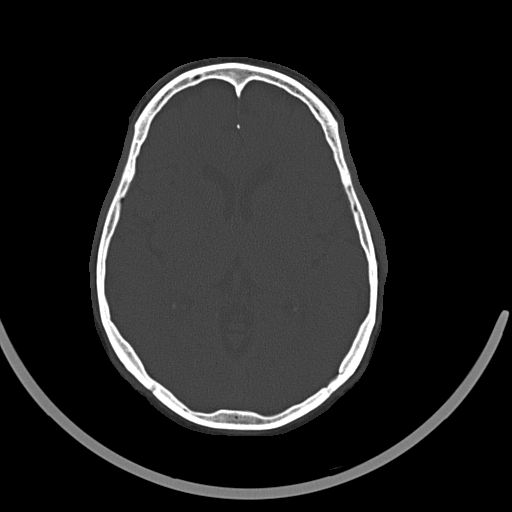
[im 36/63  bone]
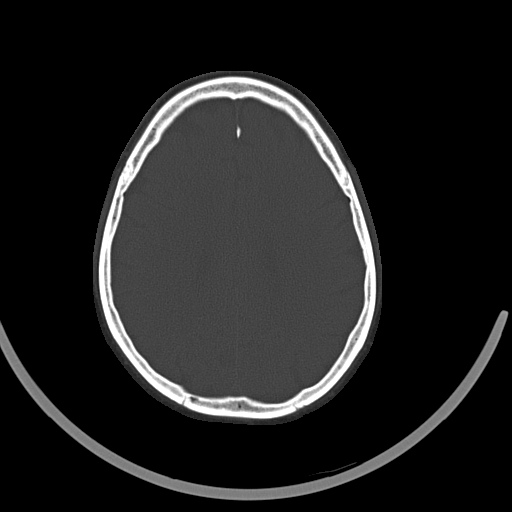
[im 43/63  bone]
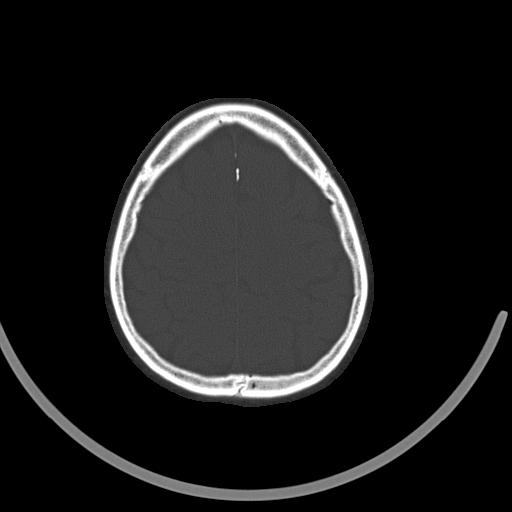
[im 49/63  bone]
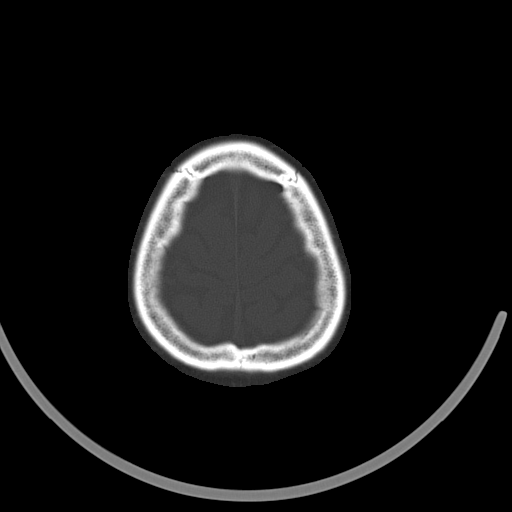
[im 56/63  bone]
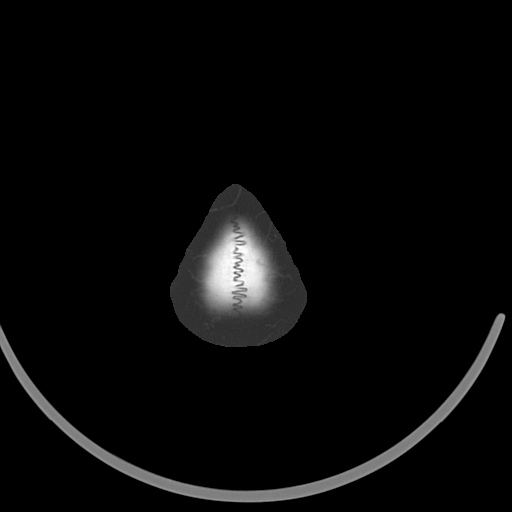

[16 of 30 positions shown; findings below may reference images not displayed]

FINDINGS: There is no acute intracranial hemorrhage or infarct. No mass lesion
or midline shift. Gray-white matter differentiation is well
maintained. Ventricles are normal in size without evidence of
hydrocephalus. CSF containing spaces are within normal limits. No
extra-axial fluid collection.

The calvarium is intact.

Orbital soft tissues are within normal limits.

Mild mucoperiosteal thickening noted within the ethmoidal air cells
and left sphenoid sinus. Scattered opacity noted within the
frontoethmoidal recesses bilaterally. Paranasal sinuses are
otherwise clear. No mastoid effusion.

Scalp soft tissues are unremarkable.
IMPRESSION: 1. No acute intracranial abnormality.
2. Mild paranasal sinus disease as above.

## 2014-01-18 NOTE — ED Notes (Signed)
Pt resting no complaints at this time. 

## 2014-01-18 NOTE — ED Notes (Signed)
Pt able to walk up and down with  Cane without difficulty

## 2014-01-18 NOTE — ED Notes (Signed)
CBG Taken = 229 

## 2014-01-18 NOTE — ED Notes (Signed)
DC of IV in right hand per [place] RN

## 2014-01-18 NOTE — Discharge Instructions (Signed)

## 2014-01-18 NOTE — ED Notes (Signed)
Pt only able to take one step , he states that the pain in his foot from his gout keeps him from walking , pt normally uses a cane which he was using
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
## 2014-02-02 ENCOUNTER — Inpatient Hospital Stay (HOSPITAL_COMMUNITY)
Admission: EM | Admit: 2014-02-02 | Discharge: 2014-02-03 | DRG: 190 | Disposition: A | Discharge status: Home / Self Care | Payer: Medicaid Other | Attending: Internal Medicine | Admitting: Internal Medicine

## 2014-02-02 ENCOUNTER — Emergency Department (HOSPITAL_COMMUNITY): Payer: Medicaid Other

## 2014-02-02 ENCOUNTER — Encounter (HOSPITAL_COMMUNITY): Payer: Self-pay | Admitting: Emergency Medicine

## 2014-02-02 DIAGNOSIS — E119 Type 2 diabetes mellitus without complications: Secondary | ICD-10-CM | POA: Diagnosis present

## 2014-02-02 DIAGNOSIS — E876 Hypokalemia: Secondary | ICD-10-CM | POA: Diagnosis present

## 2014-02-02 DIAGNOSIS — J441 Chronic obstructive pulmonary disease with (acute) exacerbation: Principal | ICD-10-CM | POA: Diagnosis present

## 2014-02-02 DIAGNOSIS — F1410 Cocaine abuse, uncomplicated: Secondary | ICD-10-CM | POA: Diagnosis present

## 2014-02-02 DIAGNOSIS — E872 Acidosis, unspecified: Secondary | ICD-10-CM | POA: Diagnosis present

## 2014-02-02 DIAGNOSIS — J45901 Unspecified asthma with (acute) exacerbation: Principal | ICD-10-CM

## 2014-02-02 DIAGNOSIS — T486X5A Adverse effect of antiasthmatics, initial encounter: Secondary | ICD-10-CM | POA: Diagnosis present

## 2014-02-02 DIAGNOSIS — Z79899 Other long term (current) drug therapy: Secondary | ICD-10-CM

## 2014-02-02 DIAGNOSIS — E8729 Other acidosis: Secondary | ICD-10-CM | POA: Diagnosis present

## 2014-02-02 DIAGNOSIS — E1165 Type 2 diabetes mellitus with hyperglycemia: Secondary | ICD-10-CM | POA: Diagnosis present

## 2014-02-02 DIAGNOSIS — Z87891 Personal history of nicotine dependence: Secondary | ICD-10-CM

## 2014-02-02 DIAGNOSIS — I10 Essential (primary) hypertension: Secondary | ICD-10-CM | POA: Diagnosis present

## 2014-02-02 DIAGNOSIS — J189 Pneumonia, unspecified organism: Secondary | ICD-10-CM | POA: Diagnosis present

## 2014-02-02 DIAGNOSIS — Z833 Family history of diabetes mellitus: Secondary | ICD-10-CM

## 2014-02-02 DIAGNOSIS — Z9981 Dependence on supplemental oxygen: Secondary | ICD-10-CM

## 2014-02-02 DIAGNOSIS — Z825 Family history of asthma and other chronic lower respiratory diseases: Secondary | ICD-10-CM

## 2014-02-02 DIAGNOSIS — E1142 Type 2 diabetes mellitus with diabetic polyneuropathy: Secondary | ICD-10-CM | POA: Diagnosis present

## 2014-02-02 DIAGNOSIS — M109 Gout, unspecified: Secondary | ICD-10-CM | POA: Diagnosis present

## 2014-02-02 HISTORY — DX: Unspecified convulsions: R56.9

## 2014-02-02 LAB — INFLUENZA PANEL BY PCR (TYPE A & B)
H1N1FLUPCR: NOT DETECTED
INFLAPCR: NEGATIVE
Influenza B By PCR: NEGATIVE

## 2014-02-02 LAB — CBC WITH DIFFERENTIAL/PLATELET
Basophils Absolute: 0 10*3/uL (ref 0.0–0.1)
Basophils Relative: 0 % (ref 0–1)
Eosinophils Absolute: 0 10*3/uL (ref 0.0–0.7)
Eosinophils Relative: 0 % (ref 0–5)
HCT: 39.2 % (ref 39.0–52.0)
Hemoglobin: 13.7 g/dL (ref 13.0–17.0)
LYMPHS ABS: 1.4 10*3/uL (ref 0.7–4.0)
Lymphocytes Relative: 12 % (ref 12–46)
MCH: 30.6 pg (ref 26.0–34.0)
MCHC: 34.9 g/dL (ref 30.0–36.0)
MCV: 87.7 fL (ref 78.0–100.0)
Monocytes Absolute: 1.3 10*3/uL — ABNORMAL HIGH (ref 0.1–1.0)
Monocytes Relative: 11 % (ref 3–12)
NEUTROS PCT: 76 % (ref 43–77)
Neutro Abs: 8.5 10*3/uL — ABNORMAL HIGH (ref 1.7–7.7)
PLATELETS: 237 10*3/uL (ref 150–400)
RBC: 4.47 MIL/uL (ref 4.22–5.81)
RDW: 16.5 % — ABNORMAL HIGH (ref 11.5–15.5)
WBC: 11.2 10*3/uL — AB (ref 4.0–10.5)

## 2014-02-02 LAB — GLUCOSE, CAPILLARY
GLUCOSE-CAPILLARY: 476 mg/dL — AB (ref 70–99)
Glucose-Capillary: 175 mg/dL — ABNORMAL HIGH (ref 70–99)

## 2014-02-02 LAB — RAPID URINE DRUG SCREEN, HOSP PERFORMED
Amphetamines: NOT DETECTED
Barbiturates: NOT DETECTED
Benzodiazepines: NOT DETECTED
COCAINE: NOT DETECTED
OPIATES: POSITIVE — AB
Tetrahydrocannabinol: NOT DETECTED

## 2014-02-02 LAB — URINE MICROSCOPIC-ADD ON

## 2014-02-02 LAB — I-STAT CHEM 8, ED
BUN: 8 mg/dL (ref 6–23)
Calcium, Ion: 1.13 mmol/L (ref 1.12–1.23)
Chloride: 100 mEq/L (ref 96–112)
Creatinine, Ser: 1.1 mg/dL (ref 0.50–1.35)
Glucose, Bld: 185 mg/dL — ABNORMAL HIGH (ref 70–99)
HCT: 43 % (ref 39.0–52.0)
HEMOGLOBIN: 14.6 g/dL (ref 13.0–17.0)
Potassium: 3.5 mEq/L — ABNORMAL LOW (ref 3.7–5.3)
Sodium: 136 mEq/L — ABNORMAL LOW (ref 137–147)
TCO2: 21 mmol/L (ref 0–100)

## 2014-02-02 LAB — URINALYSIS, ROUTINE W REFLEX MICROSCOPIC
BILIRUBIN URINE: NEGATIVE
Ketones, ur: NEGATIVE mg/dL
Leukocytes, UA: NEGATIVE
Nitrite: NEGATIVE
PH: 5.5 (ref 5.0–8.0)
Protein, ur: NEGATIVE mg/dL
SPECIFIC GRAVITY, URINE: 1.035 — AB (ref 1.005–1.030)
Urobilinogen, UA: 0.2 mg/dL (ref 0.0–1.0)

## 2014-02-02 LAB — CBG MONITORING, ED: Glucose-Capillary: 229 mg/dL — ABNORMAL HIGH (ref 70–99)

## 2014-02-02 LAB — STREP PNEUMONIAE URINARY ANTIGEN: STREP PNEUMO URINARY ANTIGEN: NEGATIVE

## 2014-02-02 LAB — PRO B NATRIURETIC PEPTIDE: Pro B Natriuretic peptide (BNP): 395.9 pg/mL — ABNORMAL HIGH (ref 0–125)

## 2014-02-02 LAB — OSMOLALITY: OSMOLALITY: 289 mosm/kg (ref 275–300)

## 2014-02-02 LAB — SALICYLATE LEVEL

## 2014-02-02 LAB — ETHANOL: Alcohol, Ethyl (B): <11 mg/dL (ref 0–11)

## 2014-02-02 IMAGING — CR DG CHEST 2V
2 series · 2 of 2 positions shown · non-contrast
Comparison: [DATE] and earlier.

CLINICAL DATA: 52-year-old male with shortness of breath. Initial
encounter. Chronic obstructed pulmonary disease.

EXAM:
CHEST  2 VIEW

[w chest pa]
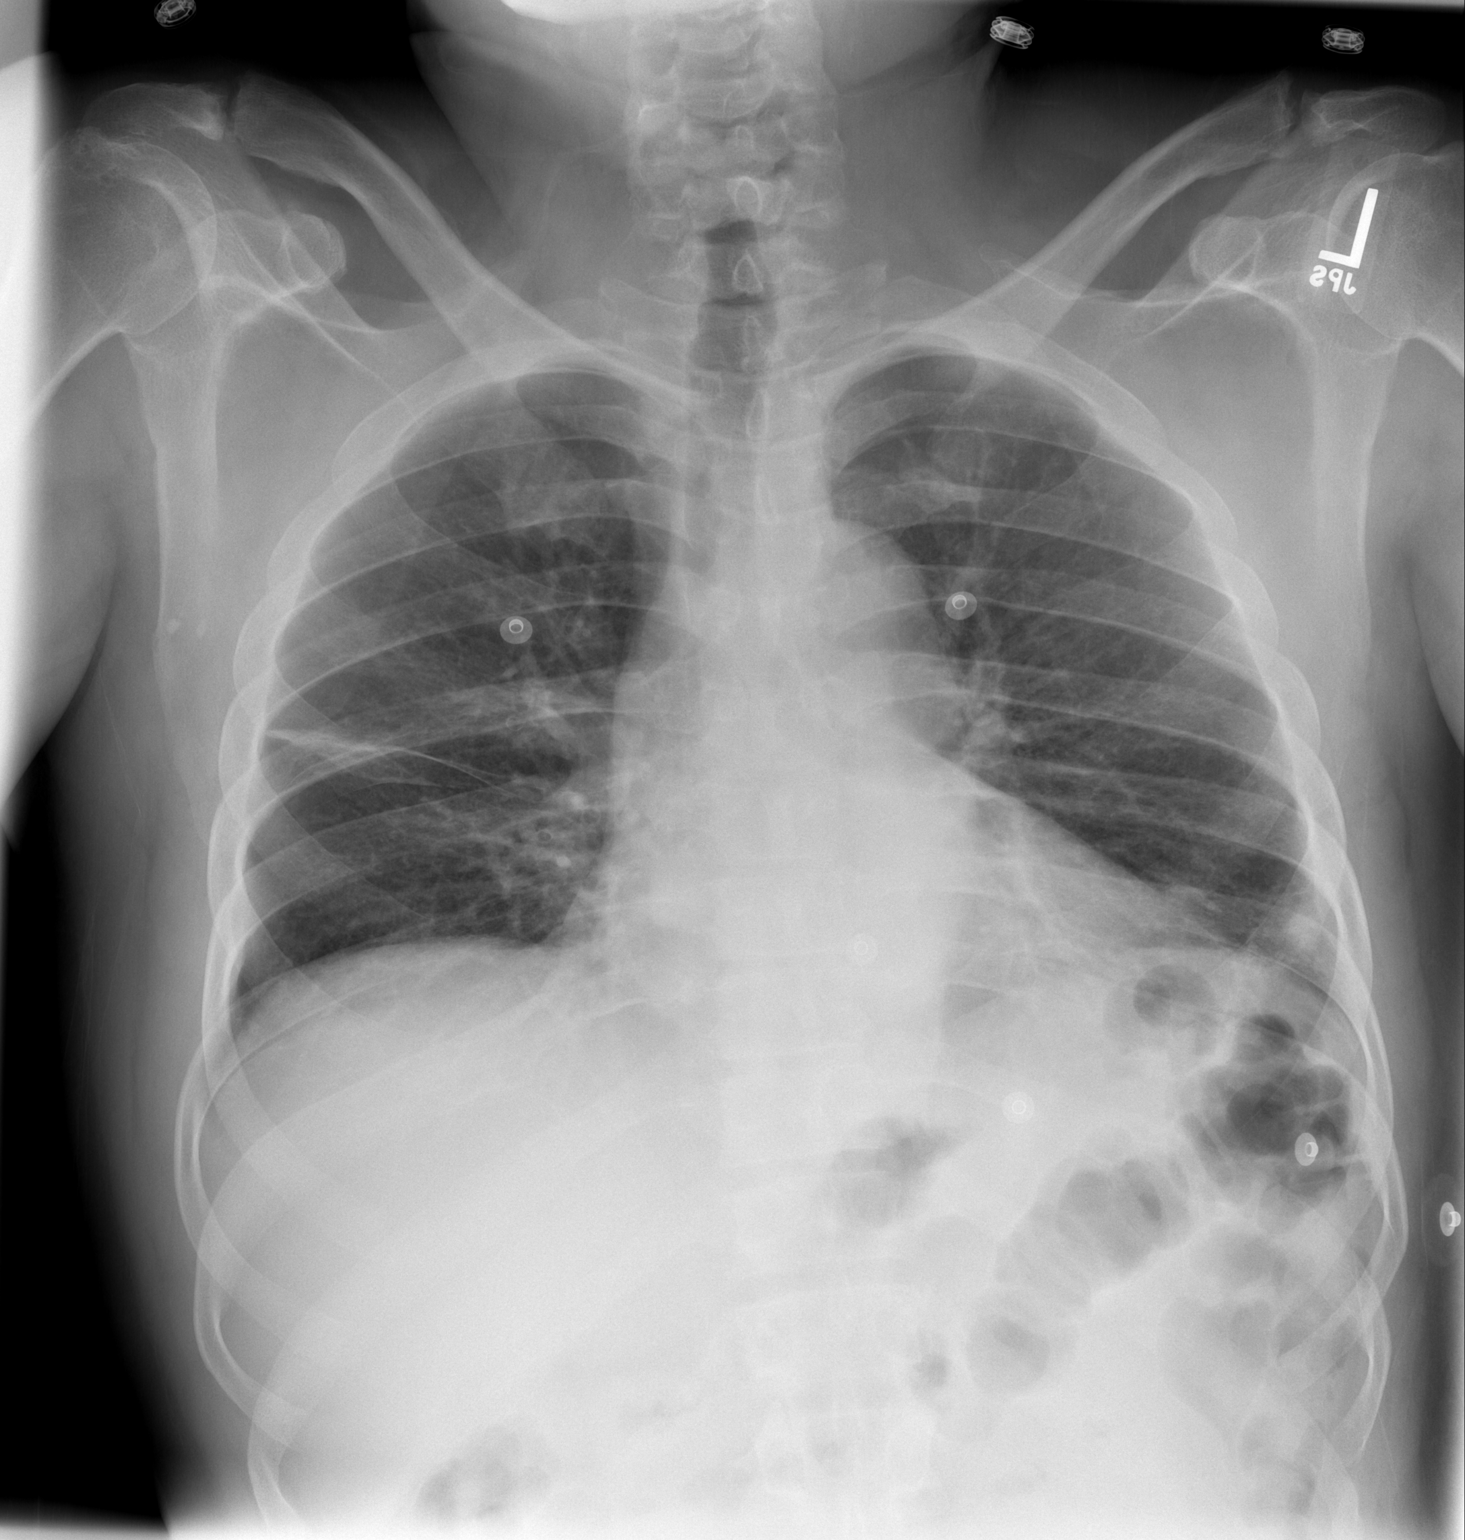

[w chest lat]
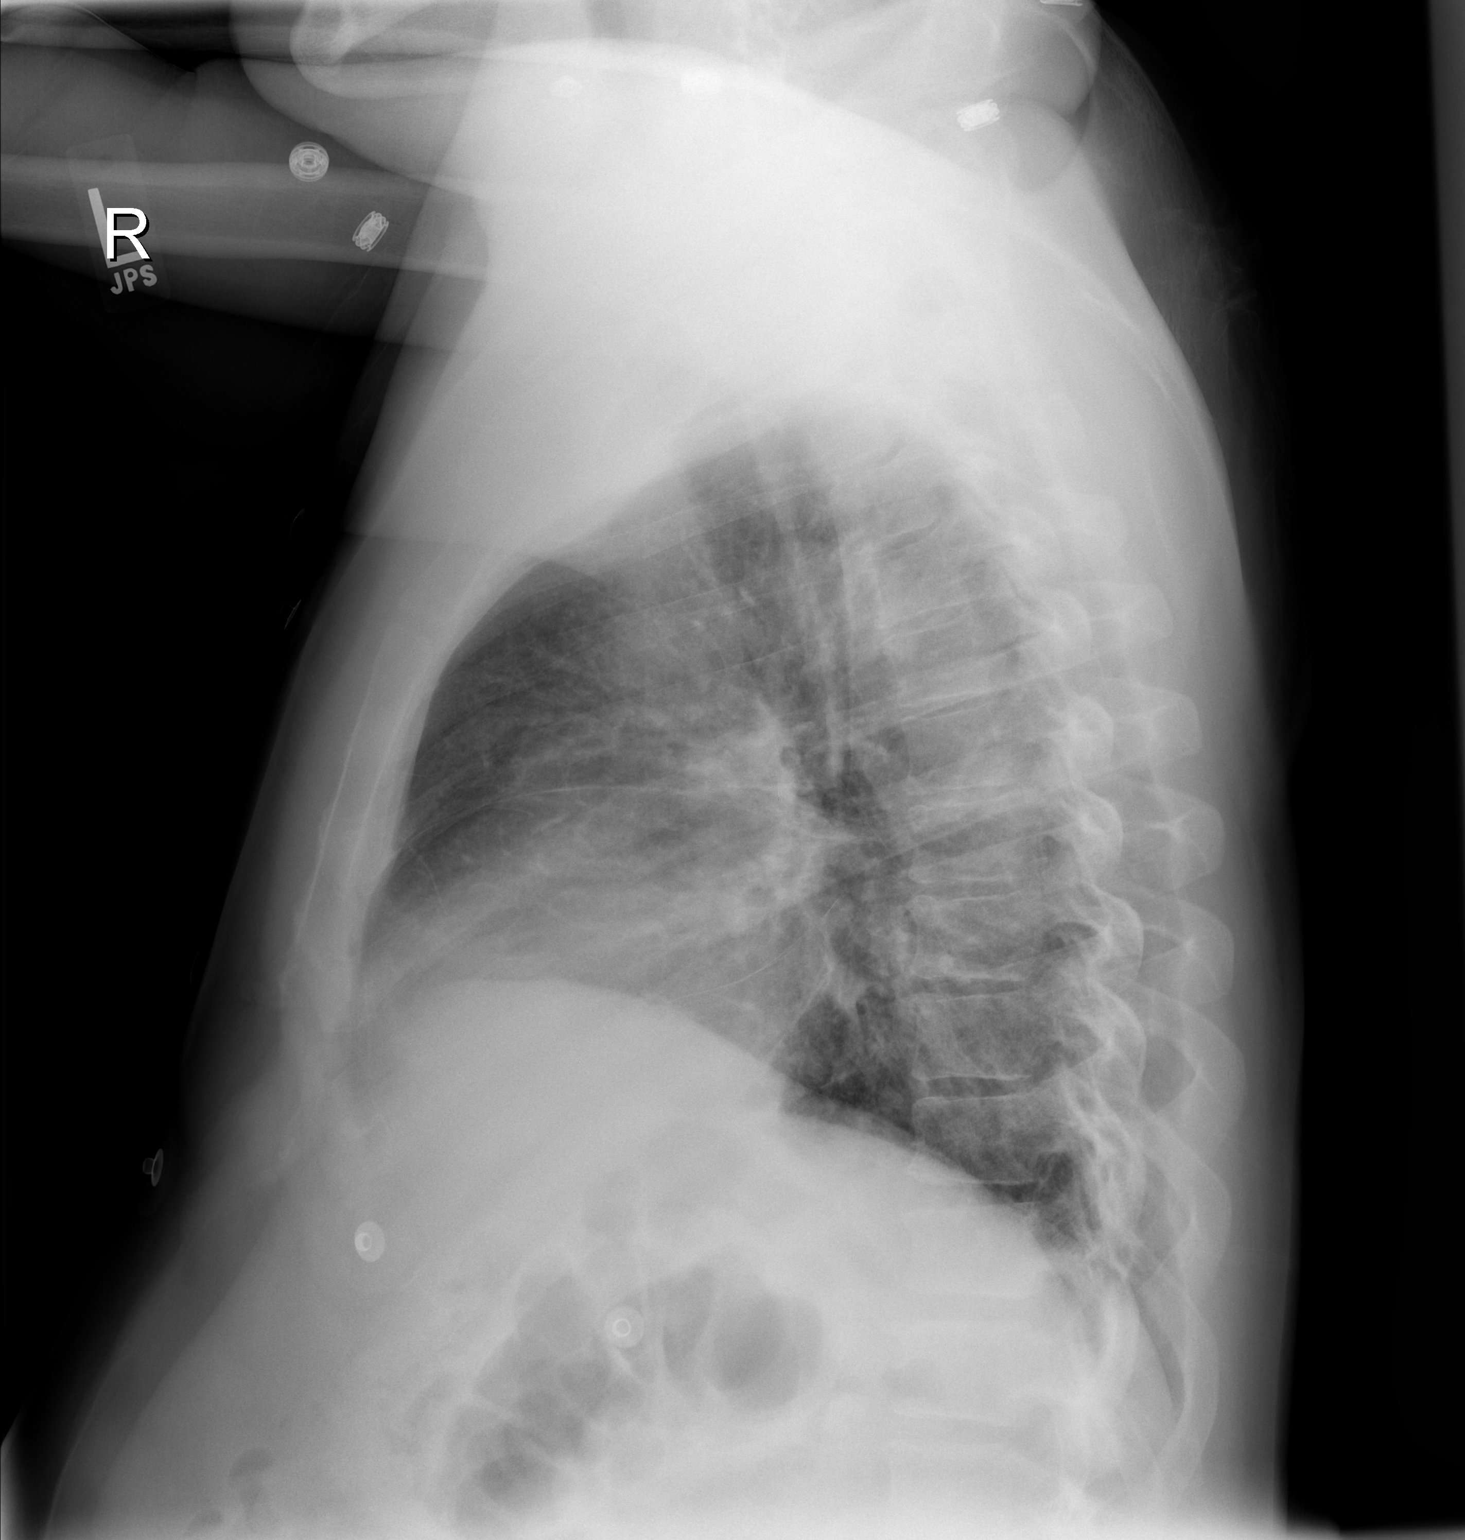

[2 of 2 positions shown; findings below may reference images not displayed]

FINDINGS: Stable lung volumes. Increased streaky and linear retrocardiac and
right mid lung opacity today. No pneumothorax or pleural effusion.
No pulmonary edema. Nodular left lateral lung base opacity is new,
might reflect nipple shadow but this is uncertain. Normal cardiac
size and mediastinal contours. No acute osseous abnormality
identified.
IMPRESSION: Increased nodular and streaky opacity at the left lung base and
right midlung, nonspecific. Top differential considerations include
acute infectious exacerbation and atelectasis.

## 2014-02-02 MED ORDER — PREDNISONE 20 MG PO TABS
60.0000 mg | ORAL_TABLET | Freq: Once | ORAL | Status: DC
  Filled 2014-02-02: qty 3

## 2014-02-02 MED ORDER — LOSARTAN POTASSIUM 50 MG PO TABS
100.0000 mg | ORAL_TABLET | Freq: Every morning | ORAL | Status: DC
  Administered 2014-02-02 – 2014-02-03 (×2): 100 mg via ORAL
  Filled 2014-02-02 (×3): qty 2

## 2014-02-02 MED ORDER — ACETAMINOPHEN 325 MG PO TABS: 650.0000 mg | ORAL_TABLET | Freq: Four times a day (QID) | ORAL | Status: DC | PRN

## 2014-02-02 MED ORDER — ATENOLOL 100 MG PO TABS
100.0000 mg | ORAL_TABLET | Freq: Every morning | ORAL | Status: DC
  Administered 2014-02-02 – 2014-02-03 (×2): 100 mg via ORAL
  Filled 2014-02-02 (×3): qty 1

## 2014-02-02 MED ORDER — INSULIN ASPART 100 UNIT/ML ~~LOC~~ SOLN: 0.0000 [IU] | Freq: Every day | SUBCUTANEOUS | Status: DC

## 2014-02-02 MED ORDER — POTASSIUM CHLORIDE CRYS ER 20 MEQ PO TBCR
40.0000 meq | EXTENDED_RELEASE_TABLET | Freq: Once | ORAL | Status: AC
  Administered 2014-02-02: 40 meq via ORAL
  Filled 2014-02-02: qty 2

## 2014-02-02 MED ORDER — ENOXAPARIN SODIUM 40 MG/0.4ML ~~LOC~~ SOLN
40.0000 mg | SUBCUTANEOUS | Status: DC
  Administered 2014-02-02: 40 mg via SUBCUTANEOUS
  Filled 2014-02-02 (×3): qty 0.4

## 2014-02-02 MED ORDER — HYDROCODONE-ACETAMINOPHEN 5-325 MG PO TABS
2.0000 | ORAL_TABLET | Freq: Once | ORAL | Status: AC
  Administered 2014-02-02: 2 via ORAL
  Filled 2014-02-02: qty 2

## 2014-02-02 MED ORDER — LEVOFLOXACIN 750 MG PO TABS: 750.0000 mg | ORAL_TABLET | Freq: Every day | ORAL | Status: DC

## 2014-02-02 MED ORDER — ACETAMINOPHEN 650 MG RE SUPP: 650.0000 mg | Freq: Four times a day (QID) | RECTAL | Status: DC | PRN

## 2014-02-02 MED ORDER — IPRATROPIUM-ALBUTEROL 0.5-2.5 (3) MG/3ML IN SOLN
3.0000 mL | Freq: Four times a day (QID) | RESPIRATORY_TRACT | Status: DC
  Administered 2014-02-02 – 2014-02-03 (×5): 3 mL via RESPIRATORY_TRACT
  Filled 2014-02-02 (×4): qty 3

## 2014-02-02 MED ORDER — AMLODIPINE BESYLATE 2.5 MG PO TABS
2.5000 mg | ORAL_TABLET | Freq: Every morning | ORAL | Status: DC
  Administered 2014-02-02 – 2014-02-03 (×2): 2.5 mg via ORAL
  Filled 2014-02-02 (×3): qty 1

## 2014-02-02 MED ORDER — SIMVASTATIN 20 MG PO TABS
20.0000 mg | ORAL_TABLET | Freq: Every day | ORAL | Status: DC
  Administered 2014-02-02: 20 mg via ORAL
  Filled 2014-02-02 (×3): qty 1

## 2014-02-02 MED ORDER — INSULIN ASPART 100 UNIT/ML ~~LOC~~ SOLN
0.0000 [IU] | Freq: Three times a day (TID) | SUBCUTANEOUS | Status: DC
  Administered 2014-02-02: 5 [IU] via SUBCUTANEOUS
  Administered 2014-02-02: 15 [IU] via SUBCUTANEOUS
  Administered 2014-02-03: 8 [IU] via SUBCUTANEOUS
  Administered 2014-02-03: 3 [IU] via SUBCUTANEOUS
  Filled 2014-02-02: qty 1

## 2014-02-02 MED ORDER — NAPROXEN 500 MG PO TABS
500.0000 mg | ORAL_TABLET | Freq: Two times a day (BID) | ORAL | Status: DC | PRN
  Filled 2014-02-02: qty 1

## 2014-02-02 MED ORDER — IPRATROPIUM-ALBUTEROL 0.5-2.5 (3) MG/3ML IN SOLN: 3.0000 mL | RESPIRATORY_TRACT | Status: DC | PRN

## 2014-02-02 MED ORDER — DEXTROSE 5 % IV SOLN
1.0000 g | Freq: Once | INTRAVENOUS | Status: AC
  Administered 2014-02-02: 1 g via INTRAVENOUS
  Filled 2014-02-02: qty 10

## 2014-02-02 MED ORDER — PREDNISONE 20 MG PO TABS
40.0000 mg | ORAL_TABLET | Freq: Every day | ORAL | Status: DC
  Administered 2014-02-03: 40 mg via ORAL
  Filled 2014-02-02 (×4): qty 2

## 2014-02-02 MED ORDER — AZITHROMYCIN 250 MG PO TABS
500.0000 mg | ORAL_TABLET | Freq: Once | ORAL | Status: AC
  Administered 2014-02-02: 500 mg via ORAL
  Filled 2014-02-02: qty 2

## 2014-02-02 MED ORDER — HYDROCODONE-ACETAMINOPHEN 5-325 MG PO TABS
1.0000 | ORAL_TABLET | ORAL | Status: DC | PRN
  Administered 2014-02-03 (×2): 1 via ORAL
  Filled 2014-02-02 (×2): qty 1

## 2014-02-02 MED ORDER — LEVOFLOXACIN 750 MG PO TABS
750.0000 mg | ORAL_TABLET | Freq: Every day | ORAL | Status: DC
  Administered 2014-02-02 – 2014-02-03 (×2): 750 mg via ORAL
  Filled 2014-02-02 (×3): qty 1

## 2014-02-02 NOTE — ED Provider Notes (Addendum)
CSN: [id number]     Arrival date & time 02/02/14  4098 History   First MD Initiated Contact with Patient 02/02/14 [phone]     Chief Complaint  Patient presents with  . Shortness of Breath     (Consider location/radiation/quality/duration/timing/severity/associated sxs/prior Treatment) HPI Complains of cough productive of green sputum and subjective fever for possibly one week. Also complains of diffuse myalgias, neck pain and left knee pain. The pain is worse with movement. Typical of gout he's had in the past. He was brought by EMS treated with nebulizer prior to coming here and Solu-Medrol. No vomiting. No abdominal pain. No other associated symptoms. Past Medical History  Diagnosis Date  . Hypertension   . Gout   . Angina pectoris   . COPD (chronic obstructive pulmonary disease)   . Asthma   . Exertional shortness of breath   . Arthritis   . Chronic lower back pain   . Pneumonia     "recently had walking pneumonia" (05/12/2013)  . On home oxygen therapy     "2L 24/7" (05/12/2013)  . Diabetes mellitus without complication    Past Surgical History  Procedure Laterality Date  . No past surgeries     Family History  Problem Relation Age of Onset  . Diabetes type II Mother   . Depression Father   . Asthma Brother    History  Substance Use Topics  . Smoking status: Current Every Day Smoker -- 0.50 packs/day for 40 years    Types: Cigarettes  . Smokeless tobacco: Never Used     Comment: 05/12/2013 'eased off smoking since last month"  . Alcohol Use: 0.0 oz/week     Comment: 05/12/2013 "usually drink 3, 40oz/day", every now and then - 11/17/13   former user of cocaine last time 2 years ago. No history of IV drug use  Review of Systems  Constitutional: Positive for fever.  HENT: Negative.   Respiratory: Positive for cough and shortness of breath.   Cardiovascular: Negative.   Gastrointestinal: Negative.   Musculoskeletal: Positive for arthralgias.  Skin: Negative.    Neurological: Negative.   Psychiatric/Behavioral: Negative.   All other systems reviewed and are negative.      Allergies  Review of patient's allergies indicates no known allergies.  Home Medications   Current Outpatient Rx  Name  Route  Sig  Dispense  Refill  . amLODipine (NORVASC) 2.5 MG tablet   Oral   Take 2.5 mg by mouth every morning.         Marland Kitchen atenolol (TENORMIN) 100 MG tablet   Oral   Take 100 mg by mouth every morning.         Marland Kitchen losartan (COZAAR) 100 MG tablet   Oral   Take 100 mg by mouth every morning.         . metFORMIN (GLUCOPHAGE-XR) 500 MG 24 hr tablet   Oral   Take 500 mg by mouth every evening.         . naproxen (NAPROSYN) 500 MG tablet   Oral   Take 1 tablet (500 mg total) by mouth 2 (two) times daily with a meal.   30 tablet   0   . nitroGLYCERIN (NITROSTAT) 0.4 MG SL tablet   Sublingual   Place 0.4 mg under the tongue every 5 (five) minutes as needed for chest pain (x2 doses).         . pravastatin (PRAVACHOL) 40 MG tablet   Oral   Take 40 mg  by mouth every evening.          BP 147/98  Pulse 108  Temp(Src) 98.1 F (36.7 C) (Oral)  Resp 28  Ht 5\' 5"  (1.651 m)  Wt 160 lb (72.576 kg)  BMI 26.63 kg/m2  SpO2 100% Physical Exam  Nursing note and vitals reviewed. Constitutional: He appears distressed.  Appears mildly uncomfortable  HENT:  Head: Normocephalic and atraumatic.  Eyes: Conjunctivae are normal. Pupils are equal, round, and reactive to light.  Neck: Neck supple. No tracheal deviation present. No thyromegaly present.  Cardiovascular:  No murmur heard. Mildly tachycardic  Pulmonary/Chest:  Speaks in sentences mild respiratory distress. Diminished breath sounds diffusely.  Abdominal: Soft. Bowel sounds are normal. He exhibits no distension. There is no tenderness.  Musculoskeletal: Normal range of motion. He exhibits edema. He exhibits no tenderness.  Left lower extremity swollen tender at knee. Not red.  Neurovascular intact. All extremities no redness or tenderness neurovascularly intact.  Neurological: He is alert. Coordination normal.  Skin: Skin is warm and dry. No rash noted.  Psychiatric: He has a normal mood and affect.    ED Course  Procedures (including critical care time) Labs Review Labs Reviewed  CBC WITH DIFFERENTIAL  I-STAT CHEM 8, ED   Imaging Review No results found.   EKG Interpretation   Date/Time:  Tuesday February 02 2014 07:47:06 EST Ventricular Rate:  108 PR Interval:  143 QRS Duration: 84 QT Interval:  333 QTC Calculation: 446 R Axis:   53 Text Interpretation:  Age not entered, assumed to be  53 years old for  purpose of ECG interpretation Sinus tachycardia Borderline repolarization  abnormality SINCE LAST TRACING HEART RATE HAS INCREASED Confirmed by  Ethelda ChickJACUBOWITZ  MD, Kameisha Malicki [phone]([id number]) on 02/02/2014 8:03:25 AM     chest xray viewed by me Results for orders placed during the hospital encounter of 02/02/14  CBC WITH DIFFERENTIAL      Result Value Ref Range   WBC 11.2 (*) 4.0 - 10.5 K/uL   RBC 4.47  4.22 - 5.81 MIL/uL   Hemoglobin 13.7  13.0 - 17.0 g/dL   HCT 60.439.2  54.039.0 - 98.152.0 %   MCV 87.7  78.0 - 100.0 fL   MCH 30.6  26.0 - 34.0 pg   MCHC 34.9  30.0 - 36.0 g/dL   RDW 19.116.5 (*) 47.811.5 - 29.515.5 %   Platelets 237  150 - 400 K/uL   Neutrophils Relative % 76  43 - 77 %   Neutro Abs 8.5 (*) 1.7 - 7.7 K/uL   Lymphocytes Relative 12  12 - 46 %   Lymphs Abs 1.4  0.7 - 4.0 K/uL   Monocytes Relative 11  3 - 12 %   Monocytes Absolute 1.3 (*) 0.1 - 1.0 K/uL   Eosinophils Relative 0  0 - 5 %   Eosinophils Absolute 0.0  0.0 - 0.7 K/uL   Basophils Relative 0  0 - 1 %   Basophils Absolute 0.0  0.0 - 0.1 K/uL  I-STAT CHEM 8, ED      Result Value Ref Range   Sodium 136 (*) 137 - 147 mEq/L   Potassium 3.5 (*) 3.7 - 5.3 mEq/L   Chloride 100  96 - 112 mEq/L   BUN 8  6 - 23 mg/dL   Creatinine, Ser 6.211.10  0.50 - 1.35 mg/dL   Glucose, Bld 308185 (*) 70 - 99 mg/dL   Calcium, Ion  6.571.13  8.461.12 - 1.23 mmol/L   TCO2  21  0 - 100 mmol/L   Hemoglobin 14.6  13.0 - 17.0 g/dL   HCT 16.1  09.6 - 04.5 %   Dg Chest 2 View  02/02/2014   CLINICAL DATA:  53 year old male with shortness of breath. Initial encounter. Chronic obstructed pulmonary disease.  EXAM: CHEST  2 VIEW  COMPARISON:  11/17/2013 and earlier.  FINDINGS: Stable lung volumes. Increased streaky and linear retrocardiac and right mid lung opacity today. No pneumothorax or pleural effusion. No pulmonary edema. Nodular left lateral lung base opacity is new, might reflect nipple shadow but this is uncertain. Normal cardiac size and mediastinal contours. No acute osseous abnormality identified.  IMPRESSION: Increased nodular and streaky opacity at the left lung base and right midlung, nonspecific. Top differential considerations include acute infectious exacerbation and atelectasis.   Electronically Signed   By: Augusto Gamble M.D.   On: 02/02/2014 08:41   Ct Head Wo Contrast  01/18/2014   CLINICAL DATA:  Altered mental status  EXAM: CT HEAD WITHOUT CONTRAST  TECHNIQUE: Contiguous axial images were obtained from the base of the skull through the vertex without intravenous contrast.  COMPARISON:  Prior CT from 11/17/2013  FINDINGS: There is no acute intracranial hemorrhage or infarct. No mass lesion or midline shift. Gray-white matter differentiation is well maintained. Ventricles are normal in size without evidence of hydrocephalus. CSF containing spaces are within normal limits. No extra-axial fluid collection.  The calvarium is intact.  Orbital soft tissues are within normal limits.  Mild mucoperiosteal thickening noted within the ethmoidal air cells and left sphenoid sinus. Scattered opacity noted within the frontoethmoidal recesses bilaterally. Paranasal sinuses are otherwise clear. No mastoid effusion.  Scalp soft tissues are unremarkable.  IMPRESSION: 1. No acute intracranial abnormality. 2. Mild paranasal sinus disease as above.    Electronically Signed   By: Rise Mu M.D.   On: 01/18/2014 03:08    10:45 AM is improved after treatment with albuterol nebulizer and intravenous antibiotics and Norco. MDM   Final diagnoses:  None   suggest inpatient stay as patient or quire glycemic control due to the steroids administered also intravenous antibiotics Spoke with internal medicine service resident physician. Who will arrange for inpatient stay Diagnosis #1 community-acquired pneumonia #2 hyperglycemia  #3 gouty arthropathy #4 tobacco abuse      Doug Sou, MD 02/02/14 1054 Addendum after review of old hospital records I realized that patient had been hospitalized December 2014 which makes today's case of pneumonia consistent with healthcare associated pneumonia. I alerted the resident physician who will add appropriate antibiotics  Doug Sou, MD 02/02/14 1109

## 2014-02-02 NOTE — ED Notes (Signed)
Report given to Synetta FailAnita, RN on 6E.

## 2014-02-02 NOTE — ED Notes (Signed)
Walked patient with oxygen 2 liters oxygen level stayed at 97 on 2 liters pt was assisted back to sitting position

## 2014-02-02 NOTE — Progress Notes (Signed)
Utilization review completed.  

## 2014-02-02 NOTE — H&P (Signed)
Date: 02/02/2014               Patient Name:  Darryl Hurley MRN: [id number]  DOB: 1961-02-27 Age / Sex: 53 y.o., male   PCP: No Pcp Per Patient         Medical Service: Internal Medicine Teaching Service         Attending Physician: Dr. Jonah Blue    First Contact: Dr. Johna Roles Pager: [phone]  Second Contact: Dr. Garald Braver Pager: [phone]       After Hours (After 5p/  First Contact Pager: [phone]  weekends / holidays): Second Contact Pager: [phone]   Chief Complaint: Shortness of breath  History of Present Illness:  The patient is a 53 yo man, history of COPD (on home O2 2L), gout, cocaine use, presenting with shortness of breath.  The patient notes a 3-day history of dyspnea, with cough productive of yellow-green sputum.  He notes associated fevers, chills, and increased albuterol usage (from 3 times/week to 4 times/day) during this time period, but no myalgias or sick contacts.  He does note worsening dyspnea when lying flat, and 3-pillow orthopnea (baseline 2-pillow) during this time period, as well as reported PND nightly for the last 3 days.  He notes no chest pain, palpitations, sore throat, or rhinorrhea.  Additionally during this time, the patient notes increase in left knee pain, similar to his prior gout flares, which have also been predominantly in his left knee, with no recent trauma, erythema, or drainage from the knee.  The patient was brought to the hospital by EMS, who administered solumedrol and a nebulizer, with moderate improvement in symptoms.  Meds: No current facility-administered medications for this encounter.   Current Outpatient Prescriptions  Medication Sig Dispense Refill  . albuterol (PROVENTIL HFA;VENTOLIN HFA) 108 (90 BASE) MCG/ACT inhaler Inhale 1 puff into the lungs every 6 (six) hours as needed for wheezing or shortness of breath.      Marland Kitchen amLODipine (NORVASC) 2.5 MG tablet Take 2.5 mg by mouth every morning.      Marland Kitchen atenolol (TENORMIN) 100 MG tablet  Take 100 mg by mouth every morning.      Marland Kitchen losartan (COZAAR) 100 MG tablet Take 200 mg by mouth every morning.       . metFORMIN (GLUCOPHAGE-XR) 500 MG 24 hr tablet Take 500 mg by mouth every evening.      . naproxen (NAPROSYN) 500 MG tablet Take 1 tablet (500 mg total) by mouth 2 (two) times daily with a meal.  30 tablet  0  . nitroGLYCERIN (NITROSTAT) 0.4 MG SL tablet Place 0.4 mg under the tongue every 5 (five) minutes as needed for chest pain (x2 doses).      . pravastatin (PRAVACHOL) 40 MG tablet Take 40 mg by mouth every evening.        Allergies: Allergies as of 02/02/2014  . (No Known Allergies)   Past Medical History  Diagnosis Date  . Hypertension   . Gout   . Angina pectoris   . COPD (chronic obstructive pulmonary disease)   . Asthma   . Exertional shortness of breath   . Arthritis   . Chronic lower back pain   . Pneumonia     "recently had walking pneumonia" (05/12/2013)  . On home oxygen therapy     "2L 24/7" (05/12/2013)  . Diabetes mellitus without complication    Past Surgical History  Procedure Laterality Date  . No past surgeries     Family History  Problem Relation Age of Onset  . Diabetes type II Mother   . Depression Father   . Asthma Brother    History   Social History  . Marital Status: Single    Spouse Name: N/A    Number of Children: N/A  . Years of Education: N/A   Occupational History  . Not on file.   Social History Main Topics  . Smoking status: Former Smoker -- 0.50 packs/day for 40 years    Types: Cigarettes  . Smokeless tobacco: Never Used     Comment: 05/12/2013 'eased off smoking since last month"  . Alcohol Use: 0.0 oz/week     Comment: 05/12/2013 "usually drink 3, 40oz/day", every now and then - 11/17/13  . Drug Use: No     Comment: 05/12/2013 "quit smoking crack long time ago"  . Sexual Activity: No   Other Topics Concern  . Not on file   Social History Narrative  . No narrative on file    Review of Systems: General:  no changes in appetite Skin: no rash HEENT: no blurry vision, hearing changes, sore throat Pulm: see HPI CV: no chest pain, palpitations Abd: no abdominal pain, nausea/vomiting, diarrhea/constipation GU: no dysuria, hematuria, polyuria Ext: no arthralgias, myalgias Neuro: no weakness, numbness, or tingling  Physical Exam: Blood pressure 149/96, pulse 90, temperature 97.5 F (36.4 C), temperature source Oral, resp. rate 20, height 5\' 5"  (1.651 m), weight 160 lb (72.576 kg), SpO2 95.00%. General: alert, cooperative, appears mildly uncomfortable HEENT: pupils equal round and reactive to light, vision grossly intact, oropharynx clear and non-erythematous  Neck: supple Lungs: mildly increased work of respiration, mild bilateral end-expiratory wheeze, bilateral crackles in lower lung fields Heart: regular rate and rhythm, no murmurs, gallops, or rubs Abdomen: soft, non-tender, non-distended, normal bowel sounds Extremities: trace bilateral non-pitting edema, left knee with mild warmth and moderate ttp, without erythema Neurologic: alert & oriented X3, cranial nerves II-XII intact, strength grossly intact, sensation intact to light touch  Lab results: Basic Metabolic Panel:  Recent Labs  24/40/1002/02/14 0827  NA 136*  K 3.5*  CL 100  GLUCOSE 185*  BUN 8  CREATININE 1.10  TCO2: 21  CBC:  Recent Labs  02/02/14 0820 02/02/14 0827  WBC 11.2*  --   NEUTROABS 8.5*  --   HGB 13.7 14.6  HCT 39.2 43.0  MCV 87.7  --   PLT 237  --    Urine Drug Screen: Drugs of Abuse     Component Value Date/Time   LABOPIA NONE DETECTED 01/18/2014 0349   COCAINSCRNUR POSITIVE* 01/18/2014 0349   LABBENZ NONE DETECTED 01/18/2014 0349   AMPHETMU NONE DETECTED 01/18/2014 0349   THCU NONE DETECTED 01/18/2014 0349   LABBARB NONE DETECTED 01/18/2014 0349      Imaging results:  Dg Chest 2 View  02/02/2014   CLINICAL DATA:  53 year old male with shortness of breath. Initial encounter. Chronic obstructed  pulmonary disease.  EXAM: CHEST  2 VIEW  COMPARISON:  11/17/2013 and earlier.  FINDINGS: Stable lung volumes. Increased streaky and linear retrocardiac and right mid lung opacity today. No pneumothorax or pleural effusion. No pulmonary edema. Nodular left lateral lung base opacity is new, might reflect nipple shadow but this is uncertain. Normal cardiac size and mediastinal contours. No acute osseous abnormality identified.  IMPRESSION: Increased nodular and streaky opacity at the left lung base and right midlung, nonspecific. Top differential considerations include acute infectious exacerbation and atelectasis.   Electronically Signed   By: Nedra HaiLee  Margo Aye M.D.   On: 02/02/2014 08:41    Other results: EKG: sinus tachycardia, J-point elevation in V2-V3 unchanged from prior, S1Q3 unchanged from prior  Assessment & Plan by Problem:  # Acute COPD exacerbation - the patient presents with dyspnea, increased cough, and increased sputum production, consistent with COPD exacerbation.  The trigger for this exacerbation may have been pneumonia (seen on CXR, fever, purulent sputum) vs URI.  Flu unlikely, though patient reported myalgias to ED provider.  Of note, the patient does report 3-pillow orthopnea and PND, and has crackles on lung exam, but only minimal LE edema, and echo 2010 shows EF 55-65%, so CHF exacerbation unlikely. -duonebs q6 scheduled, and prn -s/p solumedrol by EMS, pred 40 starting tomorrow -levaquin -check pro-BNP  # HCAP - the patient presents with fever to 100.8 (rectal), purulent sputum, WBC = 11.2, and new LLL opacity on CXR, with hospitalization 11/20/13, technically consistent with HCAP (though hospitalization was >70 days ago).  S/p ceftriaxone/azithromycin in ED (initially thought to be CAP before prior hospitalization realized). -follow-up blood cultures, urine strep, urine legionella -start levaquin for HCAP  # Gout - left knee pain x3 days, similar in location and quality to prior  gout flares, likely representing acute gout.  During hospitalization 11/20/13, the patient had the same pain, s/p arthrocentesis which did not show infection or crystals. -naproxen BID prn -pt will have steroids for COPD exacerbation  # AG acidosis - The patient presents with an elevated anion gap of 15 (by [employer]).  Differential includes alcoholic ketoacidosis vs starvation ketoacidosis vs lactic acidosis (given increased respiratory rate). -check UA for ketones, lactic acid, salicylates, ethanol level (elevated last admission) -repeat BMET in AM  # DM2 - poorly controlled, on outpatient metformin.  Last A1C = 8.1 -hold metformin -SSI  # Hypertension - chronic, stable.  BP moderately elevated now, and may increase with steroids. -continue home amlodipine, atenolol, losartan -uptitrate doses as indicated  # Hypokalemia - mild, likely due to albuterol usage -K-dur x1 -recheck BMET in AM  # Prophy - lovenox  Dispo: Disposition is deferred at this time, awaiting improvement of current medical problems. Anticipated discharge in approximately 2-3 day(s).   The patient does have a current PCP ([facility]) and does not need an [facility] hospital follow-up appointment after discharge.  Signed: Linward Headland, MD 02/02/2014, 11:23 AM

## 2014-02-02 NOTE — ED Notes (Signed)
Pt complains of bilateral rib pain from cough that started x2 days ago- productive cough with green sputum. Pt has neck pain/stiffness, back pain- fluid was drawn from back. Pt also has terrible gout pain. Pt is wheezing bilaterally. Hx asthma, COPD, DM (CBG 158). 160/108, 98% RA, sinus tach 110. EMS gave pt 10mg  of albuterol, 0.5mg  of atrovent, 125 mg solu medrol.

## 2014-02-02 NOTE — Progress Notes (Signed)
MD called re:CBG 479.Covered with 15 units of insulin as previously ordered (for CBG <400). To recheck CBG @ [facility].

## 2014-02-03 DIAGNOSIS — I10 Essential (primary) hypertension: Secondary | ICD-10-CM

## 2014-02-03 DIAGNOSIS — E872 Acidosis, unspecified: Secondary | ICD-10-CM

## 2014-02-03 DIAGNOSIS — G8929 Other chronic pain: Secondary | ICD-10-CM

## 2014-02-03 DIAGNOSIS — E785 Hyperlipidemia, unspecified: Secondary | ICD-10-CM

## 2014-02-03 DIAGNOSIS — F1410 Cocaine abuse, uncomplicated: Secondary | ICD-10-CM

## 2014-02-03 DIAGNOSIS — M25569 Pain in unspecified knee: Secondary | ICD-10-CM

## 2014-02-03 DIAGNOSIS — E119 Type 2 diabetes mellitus without complications: Secondary | ICD-10-CM

## 2014-02-03 DIAGNOSIS — J441 Chronic obstructive pulmonary disease with (acute) exacerbation: Secondary | ICD-10-CM

## 2014-02-03 LAB — GLUCOSE, CAPILLARY
GLUCOSE-CAPILLARY: 188 mg/dL — AB (ref 70–99)
Glucose-Capillary: 251 mg/dL — ABNORMAL HIGH (ref 70–99)

## 2014-02-03 LAB — BASIC METABOLIC PANEL
BUN: 21 mg/dL (ref 6–23)
CALCIUM: 9.8 mg/dL (ref 8.4–10.5)
CO2: 21 mEq/L (ref 19–32)
Chloride: 97 mEq/L (ref 96–112)
Creatinine, Ser: 0.96 mg/dL (ref 0.50–1.35)
GFR calc Af Amer: >90 mL/min (ref >90)
GFR calc non Af Amer: >90 mL/min (ref >90)
Glucose, Bld: 222 mg/dL — ABNORMAL HIGH (ref 70–99)
Potassium: 4.1 mEq/L (ref 3.7–5.3)
SODIUM: 137 meq/L (ref 137–147)

## 2014-02-03 LAB — LEGIONELLA ANTIGEN, URINE: Legionella Antigen, Urine: NEGATIVE

## 2014-02-03 MED ORDER — PREDNISONE 20 MG PO TABS: 40.0000 mg | ORAL_TABLET | Freq: Every day | ORAL | Status: DC

## 2014-02-03 MED ORDER — LEVOFLOXACIN 750 MG PO TABS: 750.0000 mg | ORAL_TABLET | Freq: Every day | ORAL | Status: DC

## 2014-02-03 NOTE — Progress Notes (Signed)
Subjective:  Pt seen and examined in AM. No acute events overnight. Pt reports he feels 100% better and would like to go home. His cough, dyspnea, fever, chills, and wheezing have resolved. He is eating well and ambulating without difficulty with normal BM and urination.    Objective: Vital signs in last 24 hours: Filed Vitals:   02/03/14 0425 02/03/14 0830 02/03/14 0900 02/03/14 1153  BP: 147/86 133/75  138/64  Pulse: 74 64  61  Temp: 97.8 F (36.6 C) 97.7 F (36.5 C)  97.5 F (36.4 C)  TempSrc: Oral Oral  Oral  Resp: 20 20  20   Height:      Weight:      SpO2: 97% 98% 99% 99%   Weight change:   Intake/Output Summary (Last 24 hours) at 02/03/14 1520 Last data filed at 02/03/14 1256  Gross per 24 hour  Intake   1200 ml  Output      0 ml  Net   1200 ml   General: alert, cooperative, NAD HEENT: EOMI,  oropharynx clear and non-erythematous  Neck: supple Lungs: no expiratory wheezing, mildly decreased breath sounds at bases Heart: regular rate and rhythm, no murmurs, gallops, or rubs Abdomen: soft, non-tender, non-distended, normal bowel sounds  Extremities: trace bilateral non-pitting edema, left knee with mild tenderness to palpation without overlying warmth or swelling     Lab Results: Basic Metabolic Panel:  Recent Labs Lab 02/02/14 0827 02/03/14 0545  NA 136* 137  K 3.5* 4.1  CL 100 97  CO2  --  21  GLUCOSE 185* 222*  BUN 8 21  CREATININE 1.10 0.96  CALCIUM  --  9.8  CBC:  Recent Labs Lab 02/02/14 0820 02/02/14 0827  WBC 11.2*  --   NEUTROABS 8.5*  --   HGB 13.7 14.6  HCT 39.2 43.0  MCV 87.7  --   PLT 237  --    BNP:  Recent Labs Lab 02/02/14 1214  PROBNP 395.9*   CBG:  Recent Labs Lab 02/02/14 1157 02/02/14 1727 02/02/14 2046 02/03/14 0829 02/03/14 1152  GLUCAP 229* 476* 175* 251* 188*   Urine Drug Screen: Drugs of Abuse     Component Value Date/Time   LABOPIA POSITIVE* 02/02/2014 2030   COCAINSCRNUR NONE DETECTED 02/02/2014  2030   LABBENZ NONE DETECTED 02/02/2014 2030   AMPHETMU NONE DETECTED 02/02/2014 2030   THCU NONE DETECTED 02/02/2014 2030   LABBARB NONE DETECTED 02/02/2014 2030    Alcohol Level:   Recent Labs Lab 02/02/14 1214  ETH <11   Urinalysis:  Recent Labs Lab 02/02/14 2030  COLORURINE YELLOW  LABSPEC 1.035*  PHURINE 5.5  GLUCOSEU >1000*  HGBUR TRACE*  BILIRUBINUR NEGATIVE  KETONESUR NEGATIVE  PROTEINUR NEGATIVE  UROBILINOGEN 0.2  NITRITE NEGATIVE  LEUKOCYTESUR NEGATIVE     Micro Results: Recent Results (from the past 240 hour(s))  CULTURE, BLOOD (ROUTINE X 2)     Status: None   Collection Time    02/02/14 12:26 PM      Result Value Ref Range Status   Specimen Description BLOOD RIGHT FOREARM   Final   Special Requests BOTTLES DRAWN AEROBIC AND ANAEROBIC 10CC   Final   Culture  Setup Time     Final   Value: 02/02/2014 16:09     Performed at [facility]   Culture     Final   Value:        BLOOD CULTURE RECEIVED NO GROWTH TO DATE CULTURE WILL BE HELD  FOR 5 DAYS BEFORE ISSUING A FINAL NEGATIVE REPORT     [facility]   Report Status PENDING   Incomplete  CULTURE, BLOOD (ROUTINE X 2)     Status: None   Collection Time    02/02/14  1:45 PM      Result Value Ref Range Status   Specimen Description BLOOD RIGHT ANTECUBITAL   Final   Special Requests BOTTLES DRAWN AEROBIC ONLY 10CC   Final   Culture  Setup Time     Final   Value: 02/02/2014 16:48     Performed at [facility]   Culture     Final   Value:        BLOOD CULTURE RECEIVED NO GROWTH TO DATE CULTURE WILL BE HELD FOR 5 DAYS BEFORE ISSUING A FINAL NEGATIVE REPORT     Performed at [facility]   Report Status PENDING   Incomplete   Studies/Results: Dg Chest 2 View  02/02/2014   CLINICAL DATA:  53 year old male with shortness of breath. Initial encounter. Chronic obstructed pulmonary disease.  EXAM: CHEST  2 VIEW  COMPARISON:  11/17/2013 and earlier.  FINDINGS: Stable lung  volumes. Increased streaky and linear retrocardiac and right mid lung opacity today. No pneumothorax or pleural effusion. No pulmonary edema. Nodular left lateral lung base opacity is new, might reflect nipple shadow but this is uncertain. Normal cardiac size and mediastinal contours. No acute osseous abnormality identified.  IMPRESSION: Increased nodular and streaky opacity at the left lung base and right midlung, nonspecific. Top differential considerations include acute infectious exacerbation and atelectasis.   Electronically Signed   By: Augusto GambleLee  Hall M.D.   On: 02/02/2014 08:41   Medications: I have reviewed the patient's current medications. Scheduled Meds: . amLODipine  2.5 mg Oral q morning - 10a  . atenolol  100 mg Oral q morning - 10a  . enoxaparin (LOVENOX) injection  40 mg Subcutaneous Q24H  . insulin aspart  0-15 Units Subcutaneous TID WC  . insulin aspart  0-5 Units Subcutaneous QHS  . ipratropium-albuterol  3 mL Nebulization Q6H  . levofloxacin  750 mg Oral Daily  . losartan  100 mg Oral q morning - 10a  . predniSONE  40 mg Oral Q breakfast  . simvastatin  20 mg Oral q1800   Continuous Infusions:  PRN Meds:.acetaminophen, acetaminophen, HYDROcodone-acetaminophen, ipratropium-albuterol, naproxen Assessment/Plan:  Acute Exacerbation of COPD due to Healthcare Associated Pneumonia - currently afebrile with normal SpO2 on 2L O2.  -Awaiting blood cultures x 2 --> NGTD -Continue Levofloxacin 750 mg daily (Day 2/7) -Prednisone 40 mg (Day 2/7)  -Repeat chest imaging in 6 weeks   Chronic Left Knee Pain - improved.  Prior synovial fluid analysis on 11/20/13 did not indicae crystal arthropathy however patient responsive to gout treatment. Xray on 06/21/13 indicated mild degenerative changes. -Home naproxen 500 mg BID PRN pain -Norco/vicodin 1-2 tab 5-325 mg Q 4 hr PRN pain    Hypertension - currently normotensive -Continue home amlodipine 2.5 mg daily -Continue home atenolol 100 mg  daily -Continue home losartan 100 mg daily   Hypokalemia - resolved. Pt presented with potassium of 3.5 on admission. Etiology most likely due to increased albuterol usage vs decreased PO intake due to illness.  -Monitor K--> 4.1  Non-Insulin Type II Diabetes Mellitus - Pt with last A1c of 8.1 on 12/19. Pt at home on metformin 500 mg daily  -Hold home metformin 500 mg daily  -Insulin sliding scale with meals  Chronic elevated Pro-BNP - Pt with chronically elevated pro-BNP (396 on admission) and 2D-echo on 07/07/09 with normal systolic EF (55-65%) with no diastolic dysfunction. Pt with mild LE edema with no other signs of volume overload.  -Continue to monitor volume status  Anion Gap Metabolic Acidosis - Pt presented with AG of 15, improved from 23 on 2/15. Work-up indicated no ketonuria or salicylates and normal lactic acid and ethanol levels. Serum osmolarity was normal (289). Etiology unclear.  -Monitor BMP --> AG 19   Hyperlipidemia - No previous lipid panel. Pt at home on pravastatin 40 mg daily.  -Continue simvastatin 20 mg daily   Substance Abuse - Pt with UDS on 01/18/14 that was positive for cocaine with UDS on admission that positive for opiates.  -Counsel on adverse effects of use.   Diet: Carb modified DVT PPx: LMWH Code: Full  Dispo: today  The patient does have a current PCP (No Pcp Per Patient) and does need an [facility] hospital follow-up appointment after discharge.  The patient does not have transportation limitations that hinder transportation to clinic appointments.  .Services Needed at time of discharge: Y = Yes, Blank = No PT:   OT:   RN:   Equipment:   Other:     LOS: 1 day   Otis Brace, MD 02/03/2014, 3:20 PM

## 2014-02-03 NOTE — Progress Notes (Signed)
Inpatient Diabetes Program Recommendations  AACE/ADA: New Consensus Statement on Inpatient Glycemic Control (2013)  Target Ranges:  Prepandial:   less than 140 mg/dL      Peak postprandial:   less than 180 mg/dL (1-2 hours)      Critically ill patients:  140 - 180 mg/dL     Results for Darryl Hurley, Darryl Hurley (MRN [id number]) as of 02/03/2014 08:59  Ref. Range 02/02/2014 11:57 02/02/2014 17:27 02/02/2014 20:46  Glucose-Capillary Latest Range: 70-99 mg/dL 956229 (H) 213476 (H) 086175 (H)    Results for Darryl Hurley, Darryl Hurley (MRN [id number]) as of 02/03/2014 08:59  Ref. Range 02/03/2014 08:29  Glucose-Capillary Latest Range: 70-99 mg/dL 528251 (H)    **Patient admitted with SOB.  Has history of DM, COPD, Cocaine abuse.  **Home DM meds: Metformin 500 mg QPM  **PCP- [facility]   **Note patient's CBGs elevated last evening and this morning.  Did not see that patient received any steroids yesterday, however, patient is scheduled to start receiving Prednisone today (40 mg daily).   **MD- Please consider the following in-hospital insulin adjustments if patient continues to have elevated glucose levels:  1. Add basal insulin- Lantus 15 units QHS (~0.2 units/kg dosing) 2. Add Novolog meal coverage- Novolog 4 units tid with meals   Will follow. Ambrose FinlandJeannine Johnston Hafsa Lohn RN, MSN, CDE Diabetes Coordinator Inpatient Diabetes Program Team Pager: [phone] (8a-10p)

## 2014-02-03 NOTE — Progress Notes (Signed)
Patient was discharged home. Patient was given discharge instructions and information on medications. Patient was discharged with belongings and stable upon discharge.

## 2014-02-03 NOTE — Discharge Instructions (Signed)
-  Take Prednisone 40 mg daily for 5 days beginning tomm 3/5 -Take Levaquin daily for 5 days beginning tomm 3/5  -Please follow-up with your PCP in 1 week -You will also need a Chest Xray in 4-6 weeks  -Pleasure meeting you - hope you were satisfied with our care!

## 2014-02-03 NOTE — H&P (Signed)
INTERNAL MEDICINE TEACHING SERVICE Attending Admission Note  Date: 02/03/2014  Patient name: Darryl Florasnthony R Hurley  Medical record number: [id number]  Date of birth: 10-17-1961    I have seen and evaluated Darryl FlorasAnthony R Hurley and discussed their care with the Residency Team.  53 yr old man with hx O2 dependent COPD, substance abuse hx, gout, Type 2 DM, presented with SOB. He admits to cough of productive sputum. Admits to fever, chills, and increase albuterol inhaler use. On admission, he improved with a nebulizer tx. He was noted to have an O2 sat of 95% on 2 L/min O2. CXR performed shows nonspecific streaky opacity at left lung base and right midling. He was started on treatment for HCAP due to admission <90 days with Levaquin as well as prednisone. Today, he states he feels much better. He is sitting up in bed in NAD asking me to go home. He is on his home dose of 2 L/min O2. His lungs show minimal wheezing at bases on exam. I recommended one more day of hospitalization, but he really wants to go home. Given his stable vital signs and quick recovery, I don't disagree at this time. Would give 5 days total of oral levaquin and 5 days of prednisone 40 mg. He can continue spiriva as outpatient. He will need a repeat CXR in 6-8 weeks to ensure resolution. Overall, he is clinically stable and I think it's reasonable to treat as outpatient. This is a COPD exacerbation.  Jonah BlueAlejandro Kimmberly Wisser, DO, FACP [facility] Internal [facility] Res[facility] Program 02/03/2014, 4:20 PM

## 2014-02-05 NOTE — Discharge Summary (Signed)
Name: Darryl Hurley MRN: [id number] DOB: May 11, 1961 53 y.o. PCP: No Pcp Per Patient  Date of Admission: 02/02/2014  7:43 AM Date of Discharge: 02/03/2014 Attending Physician: Jonah Blue, DO  Discharge Diagnosis:  Primary: Acute Exacerbation of COPD due to Healthcare Associated Pneumonia  Chronic Left Knee Pain Hypertension  Hypokalemia Non-Insulin Type II Diabetes Mellitus  Chronic elevated Pro-BNP   Anion Gap Metabolic Acidosis  Hyperlipidemia Substance Abuse  DVT Prophylaxis   Discharge Medications:   Medication List         albuterol 108 (90 BASE) MCG/ACT inhaler  Commonly known as:  PROVENTIL HFA;VENTOLIN HFA  Inhale 1 puff into the lungs every 6 (six) hours as needed for wheezing or shortness of breath.     amLODipine 2.5 MG tablet  Commonly known as:  NORVASC  Take 2.5 mg by mouth every morning.     atenolol 100 MG tablet  Commonly known as:  TENORMIN  Take 100 mg by mouth every morning.     levofloxacin 750 MG tablet  Commonly known as:  LEVAQUIN  Take 1 tablet (750 mg total) by mouth daily.     losartan 100 MG tablet  Commonly known as:  COZAAR  Take 100 mg by mouth every morning.     metFORMIN 500 MG 24 hr tablet  Commonly known as:  GLUCOPHAGE-XR  Take 500 mg by mouth every evening.     naproxen 500 MG tablet  Commonly known as:  NAPROSYN  Take 1 tablet (500 mg total) by mouth 2 (two) times daily with a meal.     nitroGLYCERIN 0.4 MG SL tablet  Commonly known as:  NITROSTAT  Place 0.4 mg under the tongue every 5 (five) minutes as needed for chest pain (x2 doses).     pravastatin 40 MG tablet  Commonly known as:  PRAVACHOL  Take 40 mg by mouth every evening.     predniSONE 20 MG tablet  Commonly known as:  DELTASONE  Take 2 tablets (40 mg total) by mouth daily with breakfast.        Disposition and follow-up:   [name] was discharged from [facility] in Good condition.  At the hospital follow up visit  please address:  1.  Resolution of COPD exacerbation - if compliant with prednisone course      Resolution of Pneumonia - if compliant with Levaquin course, needs repeat CXR in 6 weeks      Compliance with daily Spiriva maintenance therapy   2.  Labs / imaging needed at time of follow-up:  CXR in 6 weeks  3.  Pending labs/ test needing follow-up: final blood culture report from 02/02/14  Follow-up Appointments:     Follow-up Information   Schedule an appointment as soon as possible for a visit with [facility]. (For 1 week, will also need CXR in 4-6 weeks)    Specialty:  Occupational Therapy   Contact information:   [address] [place] [address] [phone]       Discharge Instructions: -Take Prednisone 40 mg daily for 5 days beginning tomm 3/5 -Take Levaquin daily for 5 days beginning tomm 3/5  -Please follow-up with your PCP in 1 week -You will also need a Chest Xray in 4-6 weeks    Consultations: none   Procedures Performed:  Dg Chest 2 View  02/02/2014   CLINICAL DATA:  53 year old male with shortness of breath. Initial encounter. Chronic obstructed pulmonary disease.  EXAM: CHEST  2  VIEW  COMPARISON:  11/17/2013 and earlier.  FINDINGS: Stable lung volumes. Increased streaky and linear retrocardiac and right mid lung opacity today. No pneumothorax or pleural effusion. No pulmonary edema. Nodular left lateral lung base opacity is new, might reflect nipple shadow but this is uncertain. Normal cardiac size and mediastinal contours. No acute osseous abnormality identified.  IMPRESSION: Increased nodular and streaky opacity at the left lung base and right midlung, nonspecific. Top differential considerations include acute infectious exacerbation and atelectasis.   Electronically Signed   By: Augusto GambleLee  Hall M.D.   On: 02/02/2014 08:41   Ct Head Wo Contrast  01/18/2014   CLINICAL DATA:  Altered mental status  EXAM: CT HEAD WITHOUT CONTRAST  TECHNIQUE: Contiguous axial  images were obtained from the base of the skull through the vertex without intravenous contrast.  COMPARISON:  Prior CT from 11/17/2013  FINDINGS: There is no acute intracranial hemorrhage or infarct. No mass lesion or midline shift. Gray-white matter differentiation is well maintained. Ventricles are normal in size without evidence of hydrocephalus. CSF containing spaces are within normal limits. No extra-axial fluid collection.  The calvarium is intact.  Orbital soft tissues are within normal limits.  Mild mucoperiosteal thickening noted within the ethmoidal air cells and left sphenoid sinus. Scattered opacity noted within the frontoethmoidal recesses bilaterally. Paranasal sinuses are otherwise clear. No mastoid effusion.  Scalp soft tissues are unremarkable.  IMPRESSION: 1. No acute intracranial abnormality. 2. Mild paranasal sinus disease as above.   Electronically Signed   By: Rise MuBenjamin  McClintock M.D.   On: 01/18/2014 03:08    2D Echo: none  Cardiac Cath: none  Admission HPI: Original Author Janalyn Harderyan Whipple, MD  The patient is a 53 yo man, history of COPD (on home O2 2L), gout, cocaine use, presenting with shortness of breath. The patient notes a 3-day history of dyspnea, with cough productive of yellow-green sputum. He notes associated fevers, chills, and increased albuterol usage (from 3 times/week to 4 times/day) during this time period, but no myalgias or sick contacts. He does note worsening dyspnea when lying flat, and 3-pillow orthopnea (baseline 2-pillow) during this time period, as well as reported PND nightly for the last 3 days. He notes no chest pain, palpitations, sore throat, or rhinorrhea. Additionally during this time, the patient notes increase in left knee pain, similar to his prior gout flares, which have also been predominantly in his left knee, with no recent trauma, erythema, or drainage from the knee. The patient was brought to the hospital by EMS, who administered solumedrol and a  nebulizer, with moderate improvement in symptoms.    Hospital Course by problem list:   Acute Exacerbation of COPD due to Healthcare Associated Pneumonia - Pt with history of oxygen dependent COPD who presented with 3 day history of worsening fever, dyspnea, and productive cough. Pt found to meet sepsis criteria on admission. Pt was tachycardic (108), tachypneic (28), febrile (38.2C) with mild leukocytosis (11.2K) on admisision with increased nodular and streaky opacity at the left lung base and right midlung on chest xray. Pt received breathing treatment, corticosteroids (IV solumedrol initially and then PO prednisone 40 mg), and antibiotics. Pt initially received IV 1 g ceftriaxone and azithormycin 500 mg for 1 day for CAP, however due to previous hospitalization (11/20/13), pt was then treated for HCAP with Levofloxacin 750 mg (received 2 days).   Further workup revealed negative influenza virus, Legionella, Strep. Pneumoniae, and blood cultures to date (final report pending at time of discharge). Pt's dyspnea  and cough markedly improved with treatment and pt had normal oxygenation (92-100%) on home 2L oxygen throughout hospitalization.  Pt also remained afebrile during hospitalization. Pt was instructed to continue PO prednisone 40 mg daily for  5 additional days (total 7 day course) as well as Levofloxacin 750 mg daily for 5 days (total 7 day course).  Pt reported home maintenance inhaler use Dorothe Pea) that is to be continued on discharge. Pt to have 1 week follow-up with PCP and repeat chest imaging in 6 weeks to ensure resolution and exclude malignancy.    Chronic Left Knee Pain - Prior synovial fluid analysis on 11/20/13 did not indicae crystal arthropathy  however patient responsive to gout treatment. Xray on 06/21/13 indicated mild degenerative changes. Pt presented with 3 day history of left knee pain during hospitalization that resolved with Norco/Vicodin.  Pt instructed to take home naproxen as  needed for pain.   Hypertension - Pt at home on amlodipine 2.5 mg daily, atenolol 100 mg daily, and losartan 100 mg daily which were continued during hospitalization. Blood pressure ranged from 126/93 - 149/96 during hospitalization.    Hypokalemia - Pt presented with potassium of 3.5 on admission that normalized with administration of supplemental potassium. Etiology most likely due to increased albuterol usage vs decreased PO intake due to illness.    Non-Insulin Type II Diabetes Mellitus - Pt with last A1c of 8.1 on 12/19. Pt at home on metformin 500 mg daily which was held during hospitalization. Pt with blood glucose of 185 and glucosuria (>1000) on admission. Pt received insulin sliding scale during hospitalization with blood glucose range of 176- 476 during hospitalization.    Chronic elevated Pro-BNP - Pt with chronically elevated pro-BNP (396 on admission) and 2D-echo on 07/07/09 with normal systolic EF (55-65%) with no diastolic dysfunction. Pt with mild LE edema with no other signs of volume overload.     Anion Gap Metabolic Acidosis - Pt presented with AG of 15, improved from 23 on 2/15. Work-up indicated no ketonuria or salicylates and normal lactic acid and ethanol levels. Serum osmolarity was normal (289). Etiology unclear.  Hyperlipidemia - No previous lipid panel. Pt at home on pravastatin 40 mg daily. Pt received simvastatin 20 mg daily during hospitalization.   Substance Abuse - Pt with UDS on 01/18/14 that was positive for cocaine with UDS on admission that positive for opiates. Pt denied recent drug use and was counseled on adverse effects of use.   DVT Prophylaxis - Pt received LMWH during hospitalization with no signs of thrombosis or evidence of HIT syndrome.   Discharge Vitals:   BP 138/64  Pulse 61  Temp(Src) 97.5 F (36.4 C) (Oral)  Resp 20  Ht 5\' 5"  (1.651 m)  Wt 85.14 kg (187 lb 11.2 oz)  BMI 31.23 kg/m2  SpO2 99%  Discharge Labs:  No results found for this or  any previous visit (from the past 24 hour(s)).  Signed: Otis Brace, MD 02/05/2014, 1:27 AM   Time Spent on Discharge: 40  minutes Services Ordered on Discharge: none Equipment Ordered on Discharge: home oxygen  (pt did not need)

## 2014-02-08 LAB — CULTURE, BLOOD (ROUTINE X 2)
CULTURE: NO GROWTH
Culture: NO GROWTH

## 2014-02-08 NOTE — Discharge Summary (Signed)
  Date: 02/08/2014  Patient name: Darryl Hurley  Medical record number: [id number]  Date of birth: [birth year]   This patient has been seen and the plan of care was discussed with the house staff. Please see their note for complete details. I concur with their findings and plan.  Jonah BlueAlejandro Valentine Barney, DO, FACP [facility] 02/08/2014, 11:48 AM

## 2014-02-26 ENCOUNTER — Emergency Department (HOSPITAL_COMMUNITY): Payer: Medicaid Other

## 2014-02-26 ENCOUNTER — Encounter (HOSPITAL_COMMUNITY): Payer: Self-pay | Admitting: Emergency Medicine

## 2014-02-26 ENCOUNTER — Emergency Department (HOSPITAL_COMMUNITY)
Admission: EM | Admit: 2014-02-26 | Discharge: 2014-02-27 | Disposition: A | Discharge status: Home / Self Care | Payer: Medicaid Other | Attending: Emergency Medicine | Admitting: Emergency Medicine

## 2014-02-26 DIAGNOSIS — F10929 Alcohol use, unspecified with intoxication, unspecified: Secondary | ICD-10-CM

## 2014-02-26 DIAGNOSIS — M25569 Pain in unspecified knee: Secondary | ICD-10-CM | POA: Insufficient documentation

## 2014-02-26 DIAGNOSIS — E119 Type 2 diabetes mellitus without complications: Secondary | ICD-10-CM | POA: Insufficient documentation

## 2014-02-26 DIAGNOSIS — J45909 Unspecified asthma, uncomplicated: Secondary | ICD-10-CM | POA: Insufficient documentation

## 2014-02-26 DIAGNOSIS — I2089 Other forms of angina pectoris: Secondary | ICD-10-CM | POA: Insufficient documentation

## 2014-02-26 DIAGNOSIS — M545 Low back pain, unspecified: Secondary | ICD-10-CM | POA: Insufficient documentation

## 2014-02-26 DIAGNOSIS — R0602 Shortness of breath: Secondary | ICD-10-CM | POA: Insufficient documentation

## 2014-02-26 DIAGNOSIS — I208 Other forms of angina pectoris: Secondary | ICD-10-CM | POA: Insufficient documentation

## 2014-02-26 DIAGNOSIS — F1010 Alcohol abuse, uncomplicated: Secondary | ICD-10-CM | POA: Insufficient documentation

## 2014-02-26 DIAGNOSIS — F17200 Nicotine dependence, unspecified, uncomplicated: Secondary | ICD-10-CM | POA: Insufficient documentation

## 2014-02-26 DIAGNOSIS — I10 Essential (primary) hypertension: Secondary | ICD-10-CM | POA: Insufficient documentation

## 2014-02-26 DIAGNOSIS — M109 Gout, unspecified: Secondary | ICD-10-CM | POA: Insufficient documentation

## 2014-02-26 DIAGNOSIS — R569 Unspecified convulsions: Secondary | ICD-10-CM | POA: Insufficient documentation

## 2014-02-26 DIAGNOSIS — M129 Arthropathy, unspecified: Secondary | ICD-10-CM | POA: Insufficient documentation

## 2014-02-26 DIAGNOSIS — Z79899 Other long term (current) drug therapy: Secondary | ICD-10-CM | POA: Insufficient documentation

## 2014-02-26 DIAGNOSIS — J4489 Other specified chronic obstructive pulmonary disease: Secondary | ICD-10-CM | POA: Insufficient documentation

## 2014-02-26 DIAGNOSIS — M25562 Pain in left knee: Secondary | ICD-10-CM

## 2014-02-26 DIAGNOSIS — J449 Chronic obstructive pulmonary disease, unspecified: Secondary | ICD-10-CM | POA: Insufficient documentation

## 2014-02-26 IMAGING — CR DG KNEE 1-2V*L*
2 series · 2 of 2 positions shown · non-contrast
Comparison: DG KNEE COMPLETE 4 VIEWS*L* dated [DATE]

CLINICAL DATA: Chronic left knee pain with swelling, history gout.

EXAM:
LEFT KNEE - 1-2 VIEW

[x knee lat left (1 of 2)]
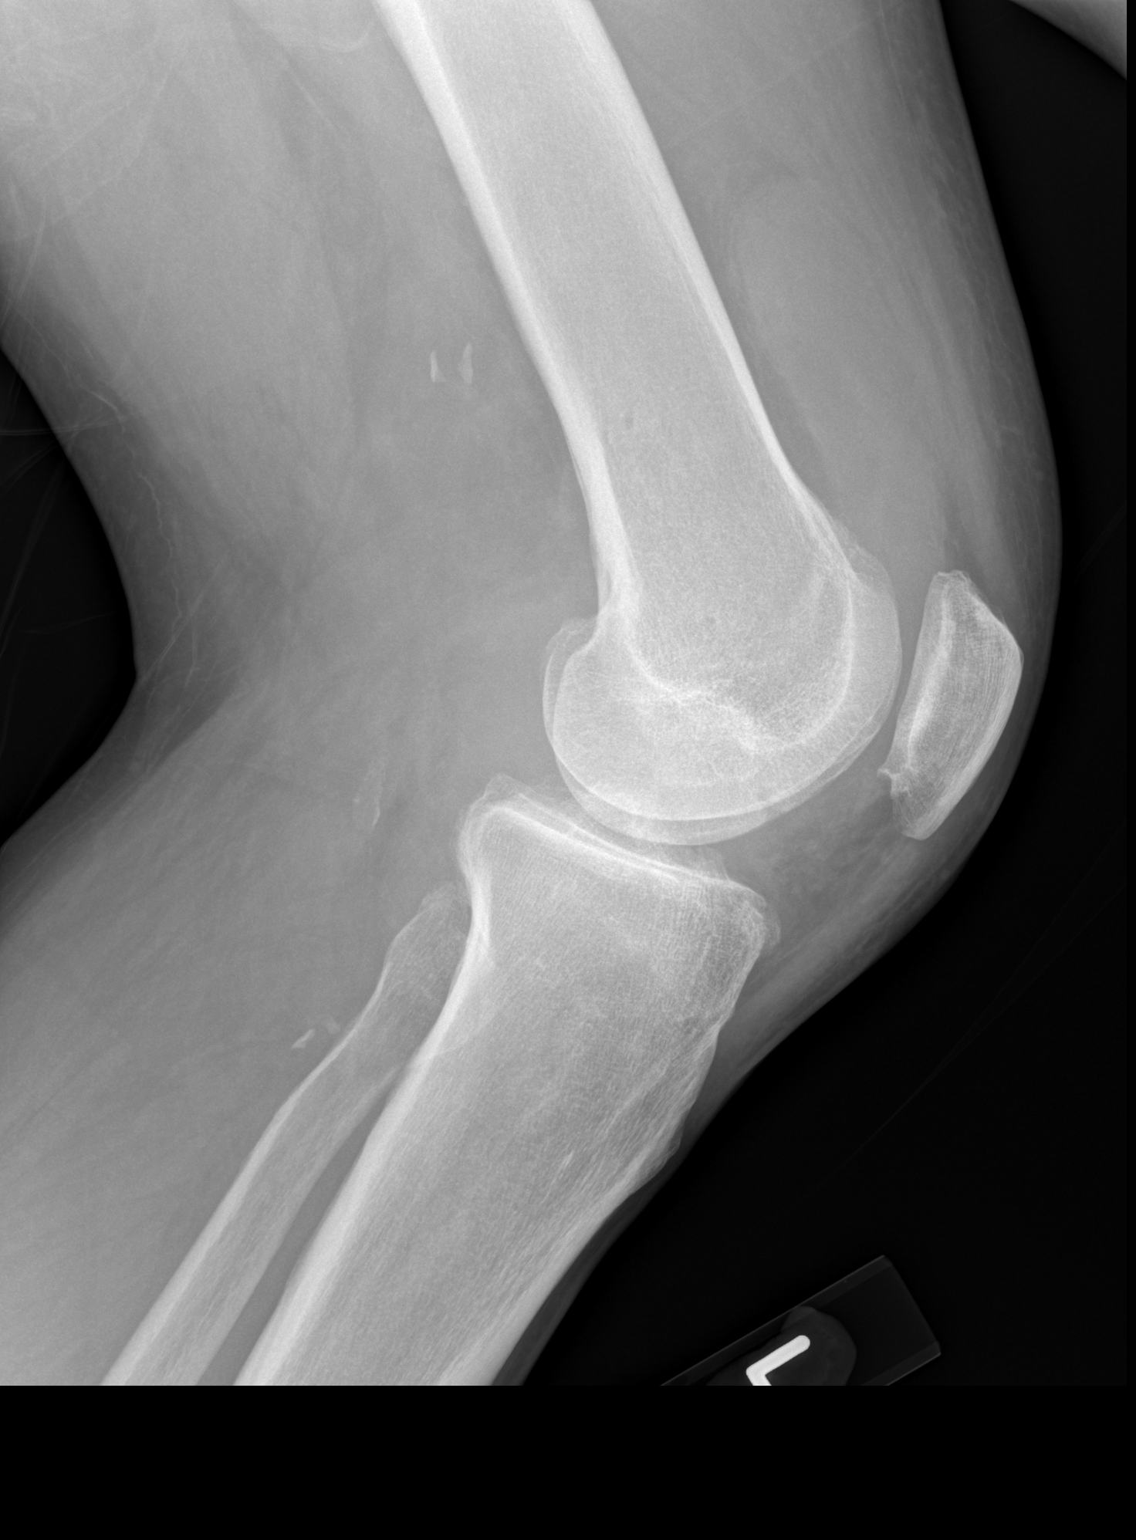

[x knee lat left (2 of 2)]
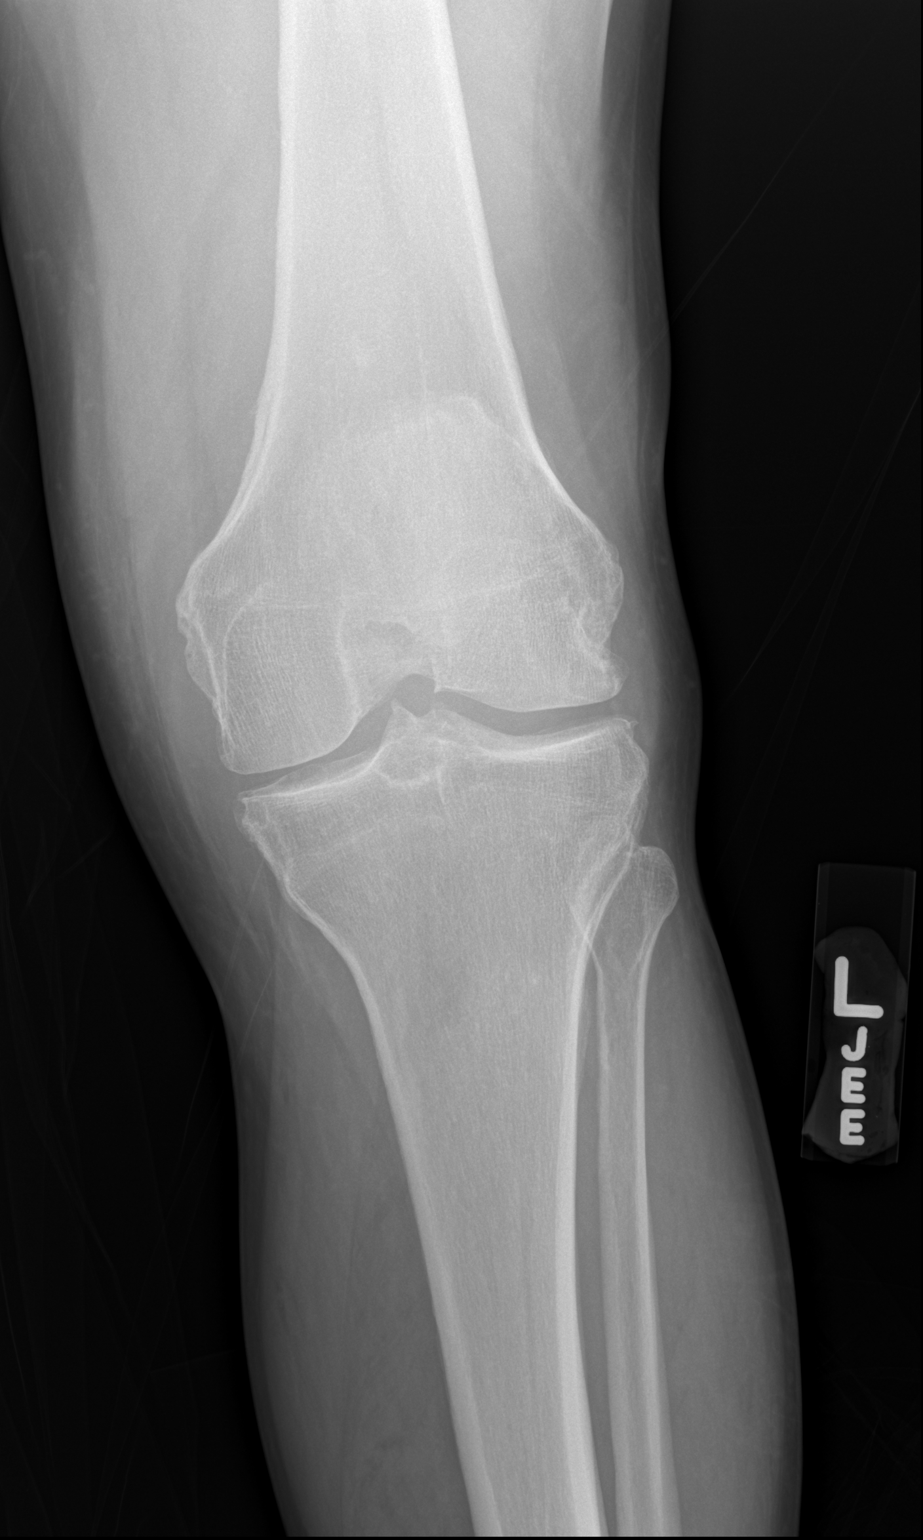

[2 of 2 positions shown; findings below may reference images not displayed]

FINDINGS: There is no evidence of fracture, dislocation, or joint effusion.
Mild to moderate tricompartmental joint space narrowing, minimal
periarticular sclerosis and marginal spurring. There is no evidence
of arthropathy or other focal bone abnormality. Mild vascular
calcifications with moderate suprapatellar joint effusion, similar.
IMPRESSION: Moderate, similar suprapatellar joint effusion without fracture
deformity or dislocation.

Stable mild to moderate tricompartmental osteoarthrosis.

  By: AUAD

## 2014-02-26 IMAGING — CT CT HEAD W/O CM
1 series · 16 of 29 positions shown, 20 images · non-contrast
Comparison: CT HEAD W/O CM dated [DATE]

CLINICAL DATA: Altered mental status

EXAM:
CT HEAD WITHOUT CONTRAST
TECHNIQUE: Contiguous axial images were obtained from the base of the skull
through the vertex without intravenous contrast.

[Series 2: head 5.0 h30s · axial · 0.43mm/px · z∈[-119,+11]mm · 16 of 29 slices shown, 20 images]
[im 2/29  brain]
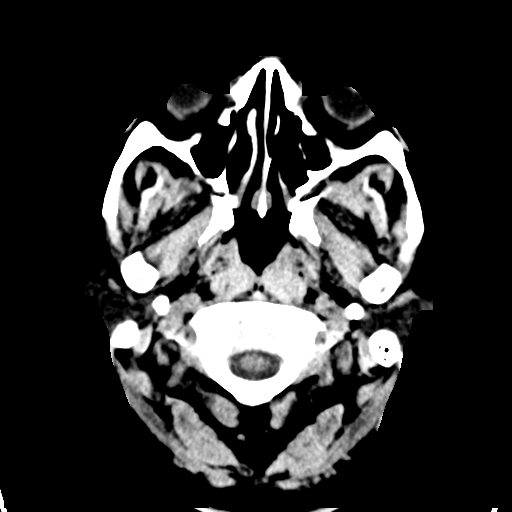
[im 2/29  bone]
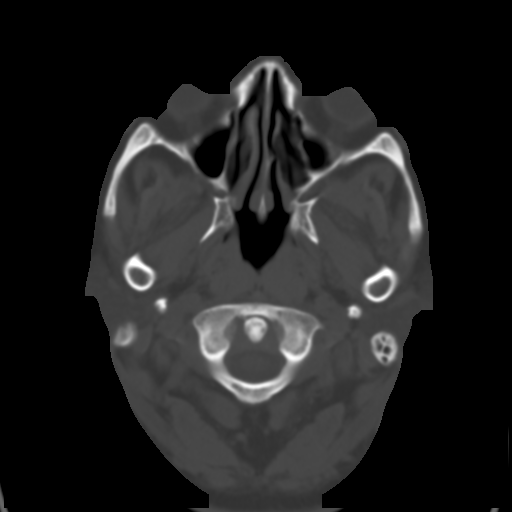
[im 4/29  brain]
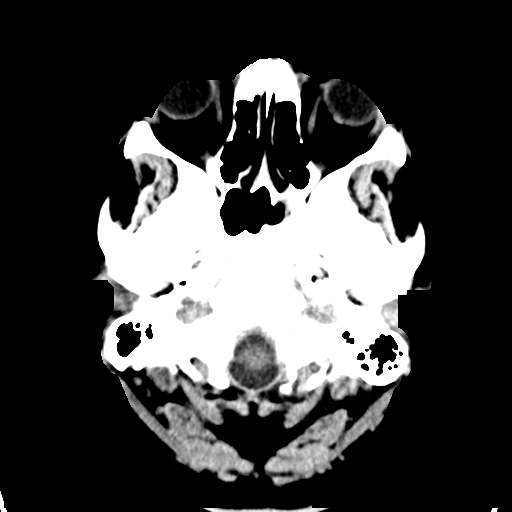
[im 6/29  brain]
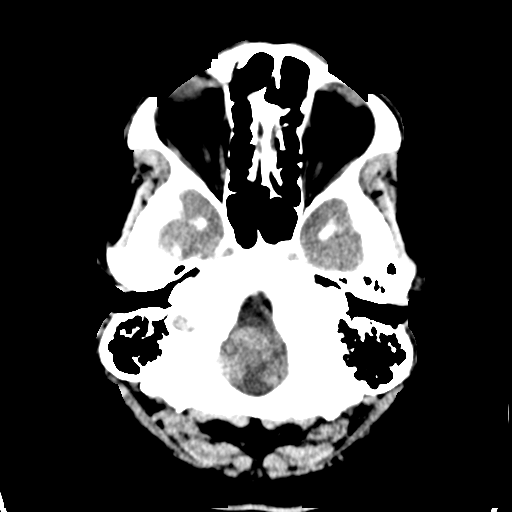
[im 7/29  brain]
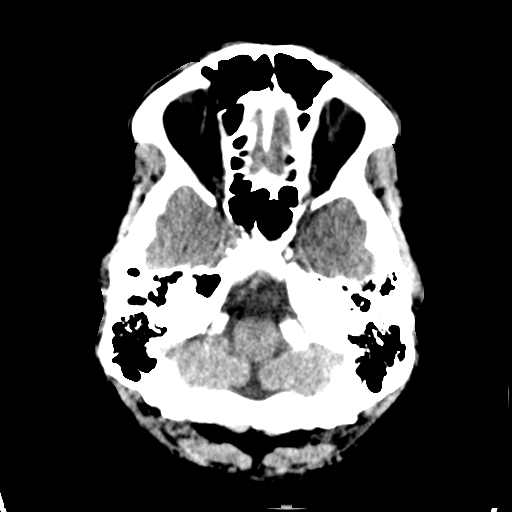
[im 9/29  brain]
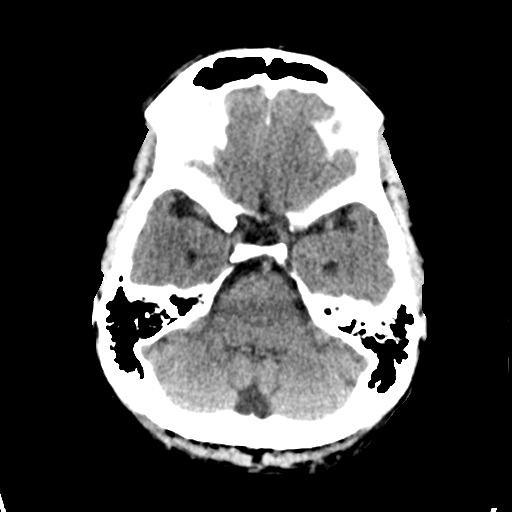
[im 9/29  bone]
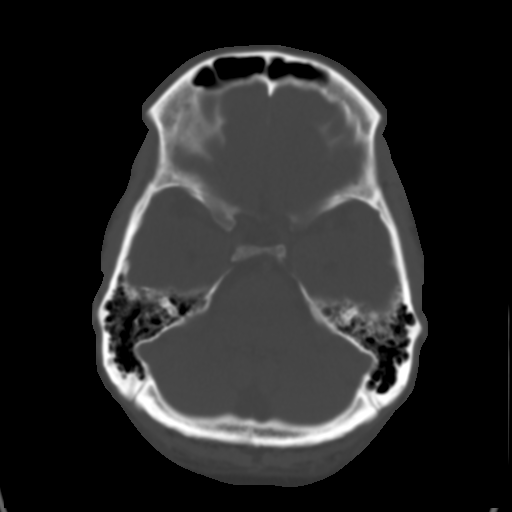
[im 11/29  brain]
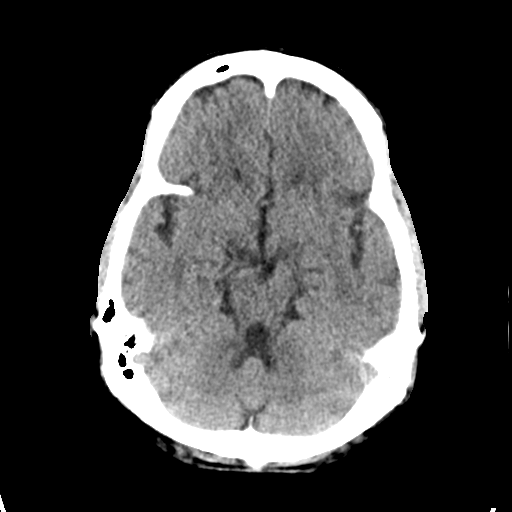
[im 12/29  brain]
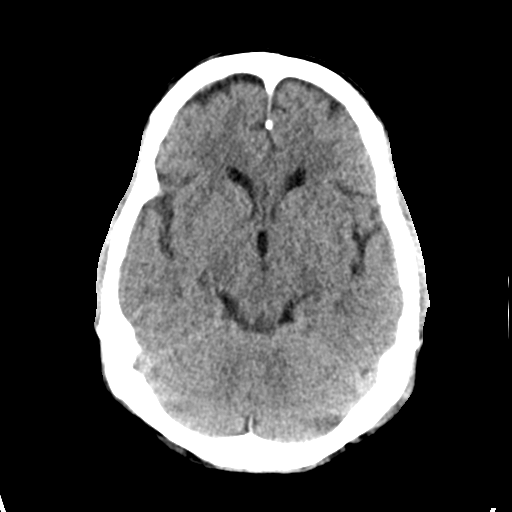
[im 14/29  brain]
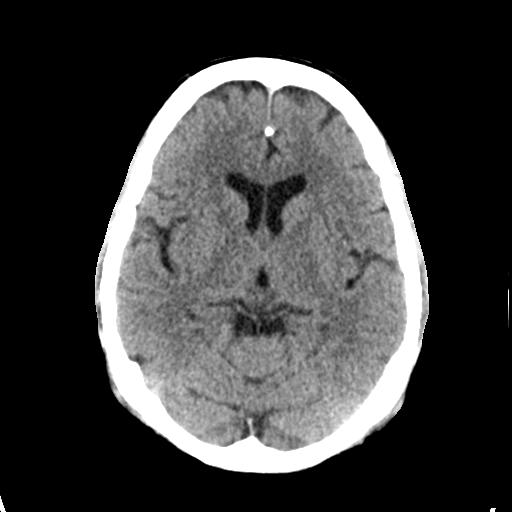
[im 16/29  brain]
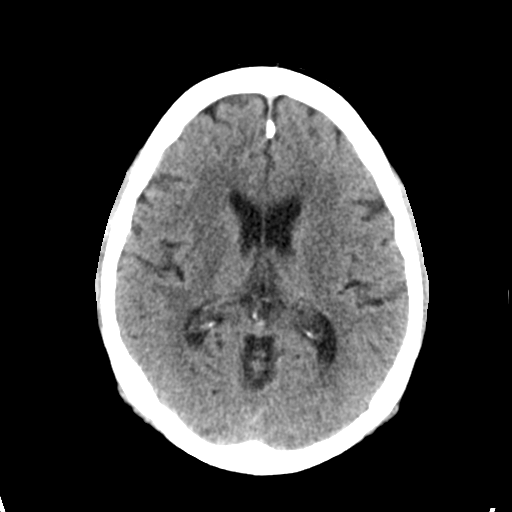
[im 16/29  bone]
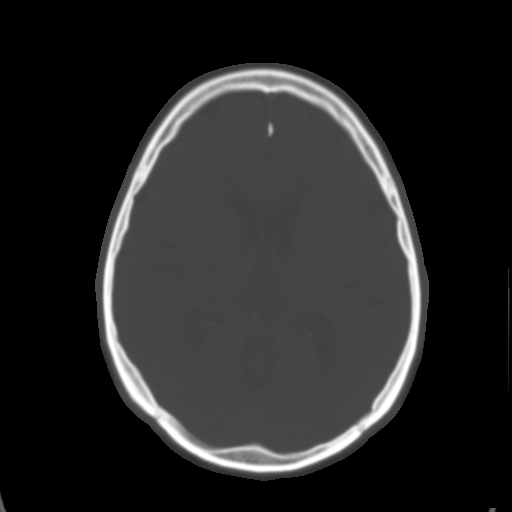
[im 18/29  brain]
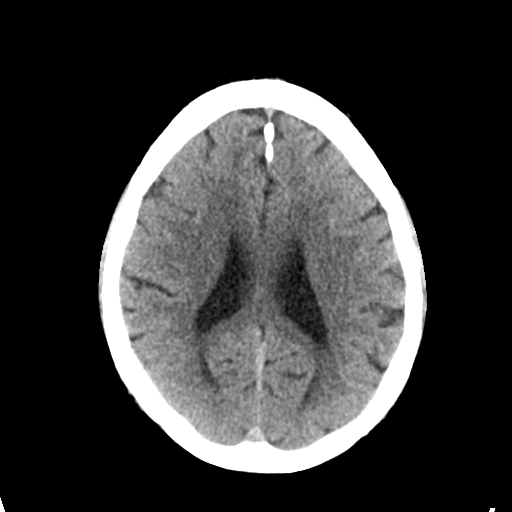
[im 19/29  brain]
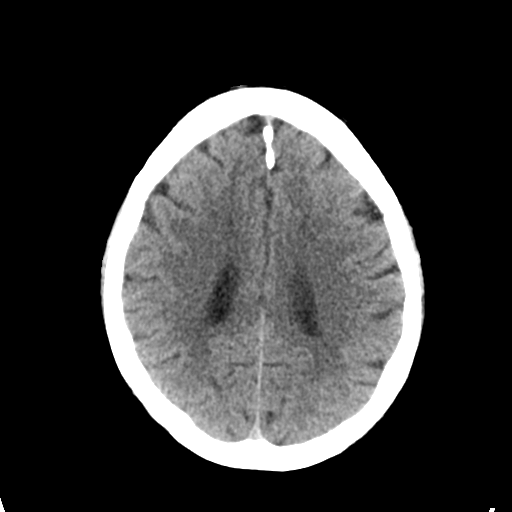
[im 21/29  brain]
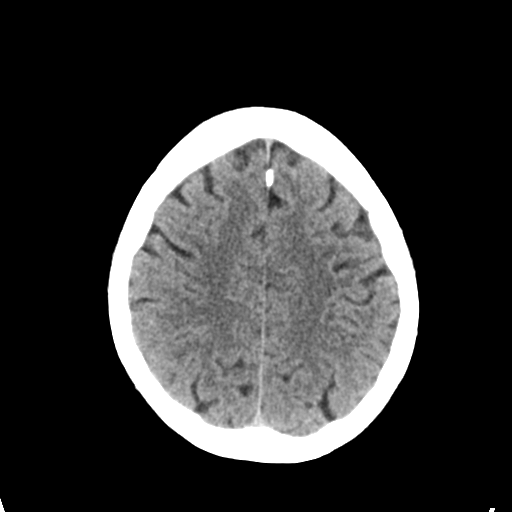
[im 23/29  brain]
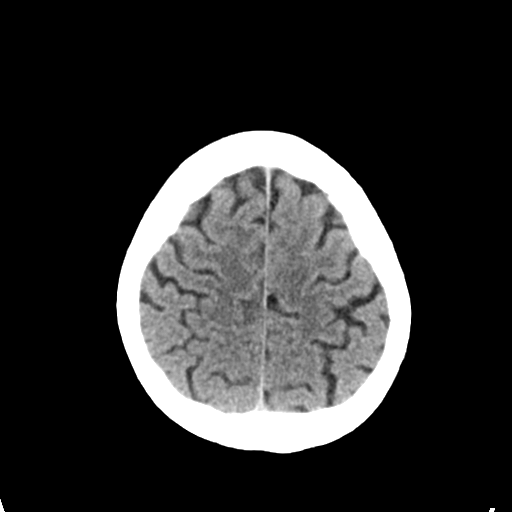
[im 23/29  bone]
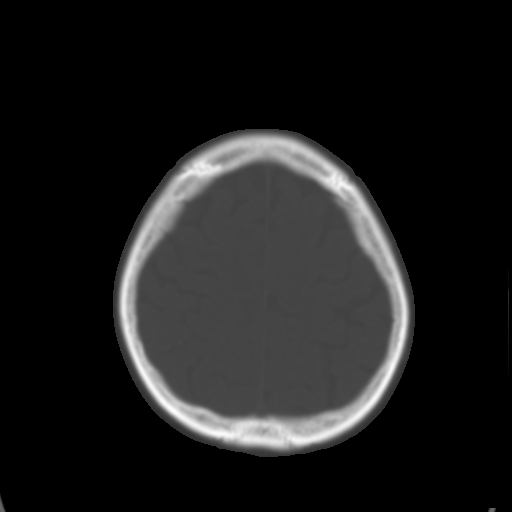
[im 24/29  brain]
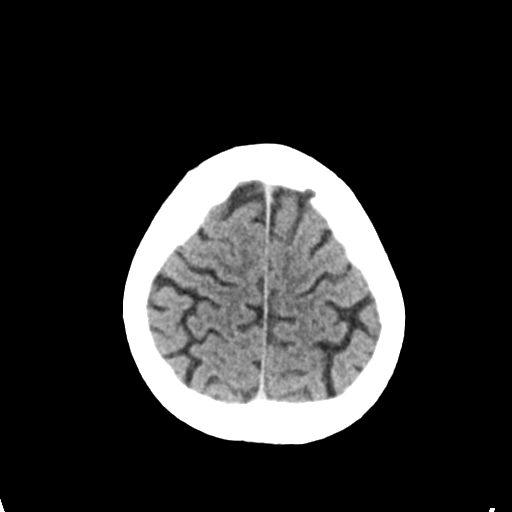
[im 26/29  brain]
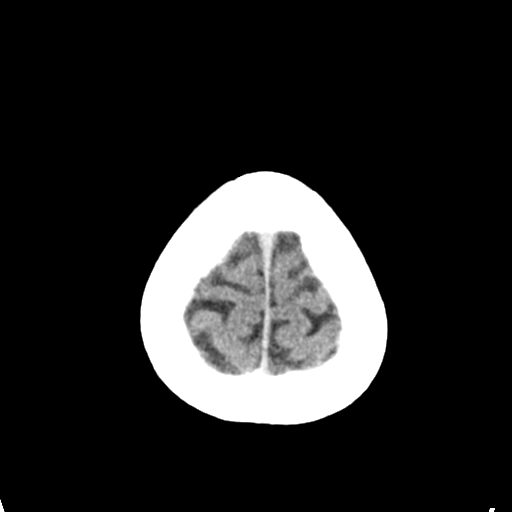
[im 28/29  brain]
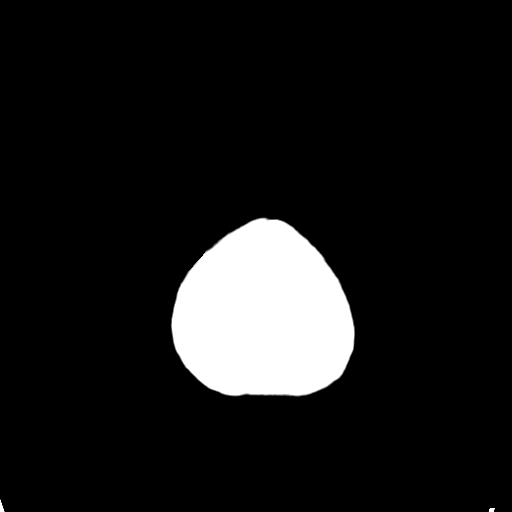

[16 of 29 positions shown; findings below may reference images not displayed]

FINDINGS: There is no evidence of mass effect, midline shift or extra-axial
fluid collections. There is no evidence of a space-occupying lesion
or intracranial hemorrhage. There is no evidence of a cortical-based
area of acute infarction.

The ventricles and sulci are appropriate for the patient's age. The
basal cisterns are patent.

Visualized portions of the orbits are unremarkable. Mild left
sphenoid sinus mucosal thickening.

The osseous structures are unremarkable.
IMPRESSION: Normal CT brain without intravenous contrast.

## 2014-02-26 NOTE — ED Notes (Signed)
Patient transported to X-ray 
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
## 2014-02-27 LAB — CBC WITH DIFFERENTIAL/PLATELET
BASOS ABS: 0 10*3/uL (ref 0.0–0.1)
Basophils Relative: 0 % (ref 0–1)
Eosinophils Absolute: 0.1 10*3/uL (ref 0.0–0.7)
Eosinophils Relative: 1 % (ref 0–5)
HCT: 36.1 % — ABNORMAL LOW (ref 39.0–52.0)
HEMOGLOBIN: 12.3 g/dL — AB (ref 13.0–17.0)
LYMPHS PCT: 28 % (ref 12–46)
Lymphs Abs: 2.6 10*3/uL (ref 0.7–4.0)
MCH: 30 pg (ref 26.0–34.0)
MCHC: 34.1 g/dL (ref 30.0–36.0)
MCV: 88 fL (ref 78.0–100.0)
Monocytes Absolute: 0.6 10*3/uL (ref 0.1–1.0)
Monocytes Relative: 7 % (ref 3–12)
NEUTROS ABS: 5.9 10*3/uL (ref 1.7–7.7)
Neutrophils Relative %: 64 % (ref 43–77)
Platelets: 384 10*3/uL (ref 150–400)
RBC: 4.1 MIL/uL — AB (ref 4.22–5.81)
RDW: 16 % — ABNORMAL HIGH (ref 11.5–15.5)
WBC: 9.2 10*3/uL (ref 4.0–10.5)

## 2014-02-27 LAB — COMPREHENSIVE METABOLIC PANEL
ALT: 14 U/L (ref 0–53)
AST: 25 U/L (ref 0–37)
Albumin: 2.9 g/dL — ABNORMAL LOW (ref 3.5–5.2)
Alkaline Phosphatase: 66 U/L (ref 39–117)
BUN: 16 mg/dL (ref 6–23)
CALCIUM: 9.4 mg/dL (ref 8.4–10.5)
CO2: 19 mEq/L (ref 19–32)
Chloride: 101 mEq/L (ref 96–112)
Creatinine, Ser: 0.87 mg/dL (ref 0.50–1.35)
GLUCOSE: 120 mg/dL — AB (ref 70–99)
Potassium: 3.5 mEq/L — ABNORMAL LOW (ref 3.7–5.3)
SODIUM: 141 meq/L (ref 137–147)
Total Bilirubin: 0.3 mg/dL (ref 0.3–1.2)
Total Protein: 7.6 g/dL (ref 6.0–8.3)

## 2014-02-27 LAB — ETHANOL: Alcohol, Ethyl (B): 26 mg/dL — ABNORMAL HIGH (ref 0–11)

## 2014-02-27 MED ORDER — NAPROXEN 500 MG PO TABS: 500.0000 mg | ORAL_TABLET | Freq: Two times a day (BID) | ORAL | Status: DC

## 2014-02-27 MED ORDER — TRAMADOL HCL 50 MG PO TABS: 50.0000 mg | ORAL_TABLET | Freq: Four times a day (QID) | ORAL | Status: DC | PRN

## 2014-02-27 NOTE — ED Provider Notes (Signed)
Patient initially seen and evaluated by Dr. Blinda LeatherwoodPollina, altered mental status secondary to alcohol intoxication. After 8 hours in the ED, alcohol level level is checked and is below the level of intoxication. When to evaluate the patient is now awake and alert. He is complaining of pain in his left knee which is chronic. He had good relief of pain in the past with naproxen so he'll be discharged with a prescription for naproxen and also tramadol. He will be given a knee immobilizer for comfort. Followup at the [facility].  Results for orders placed during the hospital encounter of 02/26/14  CBC WITH DIFFERENTIAL      Result Value Ref Range   WBC 9.2  4.0 - 10.5 K/uL   RBC 4.10 (*) 4.22 - 5.81 MIL/uL   Hemoglobin 12.3 (*) 13.0 - 17.0 g/dL   HCT 09.836.1 (*) 11.939.0 - 14.752.0 %   MCV 88.0  78.0 - 100.0 fL   MCH 30.0  26.0 - 34.0 pg   MCHC 34.1  30.0 - 36.0 g/dL   RDW 82.916.0 (*) 56.211.5 - 13.015.5 %   Platelets 384  150 - 400 K/uL   Neutrophils Relative % 64  43 - 77 %   Neutro Abs 5.9  1.7 - 7.7 K/uL   Lymphocytes Relative 28  12 - 46 %   Lymphs Abs 2.6  0.7 - 4.0 K/uL   Monocytes Relative 7  3 - 12 %   Monocytes Absolute 0.6  0.1 - 1.0 K/uL   Eosinophils Relative 1  0 - 5 %   Eosinophils Absolute 0.1  0.0 - 0.7 K/uL   Basophils Relative 0  0 - 1 %   Basophils Absolute 0.0  0.0 - 0.1 K/uL  COMPREHENSIVE METABOLIC PANEL      Result Value Ref Range   Sodium 141  137 - 147 mEq/L   Potassium 3.5 (*) 3.7 - 5.3 mEq/L   Chloride 101  96 - 112 mEq/L   CO2 19  19 - 32 mEq/L   Glucose, Bld 120 (*) 70 - 99 mg/dL   BUN 16  6 - 23 mg/dL   Creatinine, Ser 8.650.87  0.50 - 1.35 mg/dL   Calcium 9.4  8.4 - 78.410.5 mg/dL   Total Protein 7.6  6.0 - 8.3 g/dL   Albumin 2.9 (*) 3.5 - 5.2 g/dL   AST 25  0 - 37 U/L   ALT 14  0 - 53 U/L   Alkaline Phosphatase 66  39 - 117 U/L   Total Bilirubin 0.3  0.3 - 1.2 mg/dL   GFR calc non Af Amer >90  >90 mL/min   GFR calc Af Amer >90  >90 mL/min  ETHANOL      Result  Value Ref Range   Alcohol, Ethyl (B) 26 (*) 0 - 11 mg/dL   Dg Chest 2 View  6/9/62953/01/2014   CLINICAL DATA:  53 year old male with shortness of breath. Initial encounter. Chronic obstructed pulmonary disease.  EXAM: CHEST  2 VIEW  COMPARISON:  11/17/2013 and earlier.  FINDINGS: Stable lung volumes. Increased streaky and linear retrocardiac and right mid lung opacity today. No pneumothorax or pleural effusion. No pulmonary edema. Nodular left lateral lung base opacity is new, might reflect nipple shadow but this is uncertain. Normal cardiac size and mediastinal contours. No acute osseous abnormality identified.  IMPRESSION: Increased nodular and streaky opacity at the left lung base and right midlung, nonspecific. Top differential considerations include acute infectious exacerbation  and atelectasis.   Electronically Signed   By: Augusto Gamble M.D.   On: 02/02/2014 08:41   Dg Knee 2 Views Left  02/26/2014   CLINICAL DATA:  Chronic left knee pain with swelling, history gout.  EXAM: LEFT KNEE - 1-2 VIEW  COMPARISON:  DG KNEE COMPLETE 4 VIEWS*L* dated 06/21/2013  FINDINGS: There is no evidence of fracture, dislocation, or joint effusion. Mild to moderate tricompartmental joint space narrowing, minimal periarticular sclerosis and marginal spurring. There is no evidence of arthropathy or other focal bone abnormality. Mild vascular calcifications with moderate suprapatellar joint effusion, similar.  IMPRESSION: Moderate, similar suprapatellar joint effusion without fracture deformity or dislocation.  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   Electronically Signed   By: Awilda Metro   On: 02/26/2014 17:40   Ct Head Wo Contrast  02/26/2014   CLINICAL DATA:  Altered mental status  EXAM: CT HEAD WITHOUT CONTRAST  TECHNIQUE: Contiguous axial images were obtained from the base of the skull through the vertex without intravenous contrast.  COMPARISON:  CT HEAD W/O CM dated 01/18/2014  FINDINGS: There is no  evidence of mass effect, midline shift or extra-axial fluid collections. There is no evidence of a space-occupying lesion or intracranial hemorrhage. There is no evidence of a cortical-based area of acute infarction.  The ventricles and sulci are appropriate for the patient's age. The basal cisterns are patent.  Visualized portions of the orbits are unremarkable. Mild left sphenoid sinus mucosal thickening.  The osseous structures are unremarkable.  IMPRESSION: Normal CT brain without intravenous contrast.   Electronically Signed   By: Elige Ko   On: 02/26/2014 17:20      Dione Booze, MD 02/27/14 [phone]

## 2014-02-27 NOTE — Discharge Instructions (Signed)
Wear the immobilizer as needed for comfort.  Alcohol Intoxication Alcohol intoxication occurs when the amount of alcohol that a person has consumed impairs his or her ability to mentally and physically function. Alcohol directly impairs the normal chemical activity of the brain. Drinking large amounts of alcohol can lead to changes in mental function and behavior, and it can cause many physical effects that can be harmful.  Alcohol intoxication can range in severity from mild to very severe. Various factors can affect the level of intoxication that occurs, such as the person's age, gender, weight, frequency of alcohol consumption, and the presence of other medical conditions (such as diabetes, seizures, or heart conditions). Dangerous levels of alcohol intoxication may occur when people drink large amounts of alcohol in a short period (binge drinking). Alcohol can also be especially dangerous when combined with certain prescription medicines or "recreational" drugs. SIGNS AND SYMPTOMS Some common signs and symptoms of mild alcohol intoxication include:  Loss of coordination.  Changes in mood and behavior.  Impaired judgment.  Slurred speech. As alcohol intoxication progresses to more severe levels, other signs and symptoms will appear. These may include:  Vomiting.  Confusion and impaired memory.  Slowed breathing.  Seizures.  Loss of consciousness. DIAGNOSIS  Your health care provider will take a medical history and perform a physical exam. You will be asked about the amount and type of alcohol you have consumed. Blood tests will be done to measure the concentration of alcohol in your blood. In many places, your blood alcohol level must be lower than 80 mg/dL (1.61%) to legally drive. However, many dangerous effects of alcohol can occur at much lower levels.  TREATMENT  People with alcohol intoxication often do not require treatment. Most of the effects of alcohol intoxication are  temporary, and they go away as the alcohol naturally leaves the body. Your health care provider will monitor your condition until you are stable enough to go home. Fluids are sometimes given through an IV access tube to help prevent dehydration.  HOME CARE INSTRUCTIONS  Do not drive after drinking alcohol.  Stay hydrated. Drink enough water and fluids to keep your urine clear or pale yellow. Avoid caffeine.   Only take over-the-counter or prescription medicines as directed by your health care provider.  SEEK MEDICAL CARE IF:   You have persistent vomiting.   You do not feel better after a few days.  You have frequent alcohol intoxication. Your health care provider can help determine if you should see a substance use treatment counselor. SEEK IMMEDIATE MEDICAL CARE IF:   You become shaky or tremble when you try to stop drinking.   You shake uncontrollably (seizure).   You throw up (vomit) blood. This may be bright red or may look like black coffee grounds.   You have blood in your stool. This may be bright red or may appear as a black, tarry, bad smelling stool.   You become lightheaded or faint.  MAKE SURE YOU:   Understand these instructions.  Will watch your condition.  Will get help right away if you are not doing well or get worse. Document Released: 08/29/2005 Document Revised: 07/22/2013 Document Reviewed: 04/24/2013 [facility] Patient Information 2014 [place], [place].  Tramadol tablets What is this medicine? TRAMADOL (TRA ma dole) is a pain reliever. It is used to treat moderate to severe pain in adults. This medicine may be used for other purposes; ask your health care provider or pharmacist if you have questions. COMMON BRAND NAME(S):  Ultram What should I tell my health care provider before I take this medicine? They need to know if you have any of these conditions: -brain tumor -depression -drug abuse or addiction -head injury -if you frequently drink  alcohol containing drinks -kidney disease or trouble passing urine -liver disease -lung disease, asthma, or breathing problems -seizures or epilepsy -suicidal thoughts, plans, or attempt; a previous suicide attempt by you or a family member -an unusual or allergic reaction to tramadol, codeine, other medicines, foods, dyes, or preservatives -pregnant or trying to get pregnant -breast-feeding How should I use this medicine? Take this medicine by mouth with a full glass of water. Follow the directions on the prescription label. If the medicine upsets your stomach, take it with food or milk. Do not take more medicine than you are told to take. Talk to your pediatrician regarding the use of this medicine in children. Special care may be needed. Overdosage: If you think you have taken too much of this medicine contact a poison control center or emergency room at once. NOTE: This medicine is only for you. Do not share this medicine with others. What if I miss a dose? If you miss a dose, take it as soon as you can. If it is almost time for your next dose, take only that dose. Do not take double or extra doses. What may interact with this medicine? Do not take this medicine with any of the following medications: -MAOIs like Carbex, Eldepryl, Marplan, Nardil, and Parnate This medicine may also interact with the following medications: -alcohol or medicines that contain alcohol -antihistamines -benzodiazepines -bupropion -carbamazepine or oxcarbazepine -clozapine -cyclobenzaprine -digoxin -furazolidone -linezolid -medicines for depression, anxiety, or psychotic disturbances -medicines for migraine headache like almotriptan, eletriptan, frovatriptan, naratriptan, rizatriptan, sumatriptan, zolmitriptan -medicines for pain like pentazocine, buprenorphine, butorphanol, meperidine, nalbuphine, and propoxyphene -medicines for sleep -muscle relaxants -naltrexone -phenobarbital -phenothiazines like  perphenazine, thioridazine, chlorpromazine, mesoridazine, fluphenazine, prochlorperazine, promazine, and trifluoperazine -procarbazine -warfarin This list may not describe all possible interactions. Give your health care provider a list of all the medicines, herbs, non-prescription drugs, or dietary supplements you use. Also tell them if you smoke, drink alcohol, or use illegal drugs. Some items may interact with your medicine. What should I watch for while using this medicine? Tell your doctor or health care professional if your pain does not go away, if it gets worse, or if you have new or a different type of pain. You may develop tolerance to the medicine. Tolerance means that you will need a higher dose of the medicine for pain relief. Tolerance is normal and is expected if you take this medicine for a long time. Do not suddenly stop taking your medicine because you may develop a severe reaction. Your body becomes used to the medicine. This does NOT mean you are addicted. Addiction is a behavior related to getting and using a drug for a non-medical reason. If you have pain, you have a medical reason to take pain medicine. Your doctor will tell you how much medicine to take. If your doctor wants you to stop the medicine, the dose will be slowly lowered over time to avoid any side effects. You may get drowsy or dizzy. Do not drive, use machinery, or do anything that needs mental alertness until you know how this medicine affects you. Do not stand or sit up quickly, especially if you are an older patient. This reduces the risk of dizzy or fainting spells. Alcohol can increase or decrease the effects  of this medicine. Avoid alcoholic drinks. You may have constipation. Try to have a bowel movement at least every 2 to 3 days. If you do not have a bowel movement for 3 days, call your doctor or health care professional. Your mouth may get dry. Chewing sugarless gum or sucking hard candy, and drinking plenty of  water may help. Contact your doctor if the problem does not go away or is severe. What side effects may I notice from receiving this medicine? Side effects that you should report to your doctor or health care professional as soon as possible: -allergic reactions like skin rash, itching or hives, swelling of the face, lips, or tongue -breathing difficulties, wheezing -confusion -itching -light headedness or fainting spells -redness, blistering, peeling or loosening of the skin, including inside the mouth -seizures Side effects that usually do not require medical attention (report to your doctor or health care professional if they continue or are bothersome): -constipation -dizziness -drowsiness -headache -nausea, vomiting This list may not describe all possible side effects. Call your doctor for medical advice about side effects. You may report side effects to FDA at 1-800-FDA-1088. Where should I keep my medicine? Keep out of the reach of children. Store at room temperature between 15 and 30 degrees C (59 and 86 degrees F). Keep container tightly closed. Throw away any unused medicine after the expiration date. NOTE: This sheet is a summary. It may not cover all possible information. If you have questions about this medicine, talk to your doctor, pharmacist, or health care provider.  2014, Elsevier/Gold Standard. (2010-08-02 11:55:44)  Naproxen and naproxen sodium oral immediate-release tablets What is this medicine? NAPROXEN (na PROX en) is a non-steroidal anti-inflammatory drug (NSAID). It is used to reduce swelling and to treat pain. This medicine may be used for dental pain, headache, or painful monthly periods. It is also used for painful joint and muscular problems such as arthritis, tendinitis, bursitis, and gout. This medicine may be used for other purposes; ask your health care provider or pharmacist if you have questions. COMMON BRAND NAME(S): Aflaxen, Aleve Arthritis, Aleve,  All Day Relief, Anaprox DS, Anaprox, Naprosyn What should I tell my health care provider before I take this medicine? They need to know if you have any of these conditions: -asthma -cigarette smoker -drink more than 3 alcohol containing drinks a day -heart disease or circulation problems such as heart failure or leg edema (fluid retention) -high blood pressure -kidney disease -liver disease -stomach bleeding or ulcers -an unusual or allergic reaction to naproxen, aspirin, other NSAIDs, other medicines, foods, dyes, or preservatives -pregnant or trying to get pregnant -breast-feeding How should I use this medicine? Take this medicine by mouth with a glass of water. Follow the directions on the prescription label. Take it with food if your stomach gets upset. Try to not lie down for at least 10 minutes after you take it. Take your medicine at regular intervals. Do not take your medicine more often than directed. Long-term, continuous use may increase the risk of heart attack or stroke. A special MedGuide will be given to you by the pharmacist with each prescription and refill. Be sure to read this information carefully each time. Talk to your pediatrician regarding the use of this medicine in children. Special care may be needed. Overdosage: If you think you have taken too much of this medicine contact a poison control center or emergency room at once. NOTE: This medicine is only for you. Do not share this medicine  with others. What if I miss a dose? If you miss a dose, take it as soon as you can. If it is almost time for your next dose, take only that dose. Do not take double or extra doses. What may interact with this medicine? -alcohol -aspirin -cidofovir -diuretics -lithium -methotrexate -other drugs for inflammation like ketorolac or prednisone -pemetrexed -probenecid -warfarin This list may not describe all possible interactions. Give your health care provider a list of all the  medicines, herbs, non-prescription drugs, or dietary supplements you use. Also tell them if you smoke, drink alcohol, or use illegal drugs. Some items may interact with your medicine. What should I watch for while using this medicine? Tell your doctor or health care professional if your pain does not get better. Talk to your doctor before taking another medicine for pain. Do not treat yourself. This medicine does not prevent heart attack or stroke. In fact, this medicine may increase the chance of a heart attack or stroke. The chance may increase with longer use of this medicine and in people who have heart disease. If you take aspirin to prevent heart attack or stroke, talk with your doctor or health care professional. Do not take other medicines that contain aspirin, ibuprofen, or naproxen with this medicine. Side effects such as stomach upset, nausea, or ulcers may be more likely to occur. Many medicines available without a prescription should not be taken with this medicine. This medicine can cause ulcers and bleeding in the stomach and intestines at any time during treatment. Do not smoke cigarettes or drink alcohol. These increase irritation to your stomach and can make it more susceptible to damage from this medicine. Ulcers and bleeding can happen without warning symptoms and can cause death. You may get drowsy or dizzy. Do not drive, use machinery, or do anything that needs mental alertness until you know how this medicine affects you. Do not stand or sit up quickly, especially if you are an older patient. This reduces the risk of dizzy or fainting spells. This medicine can cause you to bleed more easily. Try to avoid damage to your teeth and gums when you brush or floss your teeth. What side effects may I notice from receiving this medicine? Side effects that you should report to your doctor or health care professional as soon as possible: -black or bloody stools, blood in the urine or  vomit -blurred vision -chest pain -difficulty breathing or wheezing -nausea or vomiting -severe stomach pain -skin rash, skin redness, blistering or peeling skin, hives, or itching -slurred speech or weakness on one side of the body -swelling of eyelids, throat, lips -unexplained weight gain or swelling -unusually weak or tired -yellowing of eyes or skin Side effects that usually do not require medical attention (report to your doctor or health care professional if they continue or are bothersome): -constipation -headache -heartburn This list may not describe all possible side effects. Call your doctor for medical advice about side effects. You may report side effects to FDA at 1-800-FDA-1088. Where should I keep my medicine? Keep out of the reach of children. Store at room temperature between 15 and 30 degrees C (59 and 86 degrees F). Keep container tightly closed. Throw away any unused medicine after the expiration date. NOTE: This sheet is a summary. It may not cover all possible information. If you have questions about this medicine, talk to your doctor, pharmacist, or health care provider.  2014, Elsevier/Gold Standard. (2009-11-21 20:10:16)

## 2014-03-03 NOTE — ED Provider Notes (Signed)
I saw and evaluated the patient, reviewed the resident's note and I agree with the findings and plan.   EKG Interpretation   Date/Time:  Friday February 26 2014 16:30:48 EDT Ventricular Rate:  96 PR Interval:  141 QRS Duration: 88 QT Interval:  353 QTC Calculation: 446 R Axis:   83 Text Interpretation:  Age not entered, assumed to be  53 years old for  purpose of ECG interpretation Sinus rhythm Borderline repolarization  abnormality No significant change since last tracing Confirmed by Latissa Frick   MD, Lasonya Hubner [phone]([id number]) on 02/26/2014 5:17:23 PM     Patient seen and evaluated for acute alcohol intoxication. His only complaint is left knee discomfort, has a history of gout. Examination is consistent with gout. Work up unremarkable other than alcohol level. Monitor, reevaluate when sober.  Gilda Creasehristopher J. Francine Hannan, MD 03/03/14 [phone]

## 2014-07-20 ENCOUNTER — Ambulatory Visit (INDEPENDENT_AMBULATORY_CARE_PROVIDER_SITE_OTHER): Payer: Medicaid Other | Admitting: Diagnostic Neuroimaging

## 2014-07-20 ENCOUNTER — Encounter: Payer: Self-pay | Admitting: Diagnostic Neuroimaging

## 2014-07-20 VITALS — BP 141/91 | HR 77 | Temp 98.1°F | Ht 65.5 in | Wt 197.8 lb

## 2014-07-20 DIAGNOSIS — IMO0002 Reserved for concepts with insufficient information to code with codable children: Secondary | ICD-10-CM

## 2014-07-20 DIAGNOSIS — M5442 Lumbago with sciatica, left side: Secondary | ICD-10-CM

## 2014-07-20 DIAGNOSIS — M5430 Sciatica, unspecified side: Secondary | ICD-10-CM

## 2014-07-20 DIAGNOSIS — M5416 Radiculopathy, lumbar region: Secondary | ICD-10-CM

## 2014-07-20 NOTE — Patient Instructions (Signed)
I will check MRI lumbar spine.  I will setup physical therapy.
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
## 2014-08-23 ENCOUNTER — Other Ambulatory Visit: Payer: Self-pay | Admitting: Diagnostic Neuroimaging

## 2014-08-23 DIAGNOSIS — M5442 Lumbago with sciatica, left side: Secondary | ICD-10-CM

## 2014-08-23 DIAGNOSIS — M5416 Radiculopathy, lumbar region: Secondary | ICD-10-CM

## 2014-08-30 ENCOUNTER — Ambulatory Visit: Payer: Medicaid Other | Attending: Diagnostic Neuroimaging | Admitting: Physical Therapy

## 2014-09-02 ENCOUNTER — Other Ambulatory Visit: Payer: Medicaid Other

## 2014-09-09 ENCOUNTER — Other Ambulatory Visit: Payer: Medicaid Other

## 2014-09-09 ENCOUNTER — Inpatient Hospital Stay: Admission: RE | Admit: 2014-09-09 | Payer: Medicaid Other | Source: Ambulatory Visit

## 2015-01-03 ENCOUNTER — Emergency Department (HOSPITAL_COMMUNITY)
Admission: EM | Admit: 2015-01-03 | Discharge: 2015-01-04 | Disposition: A | Discharge status: Home / Self Care | Payer: Medicaid Other | Attending: Emergency Medicine | Admitting: Emergency Medicine

## 2015-01-03 ENCOUNTER — Emergency Department (HOSPITAL_COMMUNITY): Payer: Medicaid Other

## 2015-01-03 ENCOUNTER — Encounter (HOSPITAL_COMMUNITY): Payer: Self-pay | Admitting: Emergency Medicine

## 2015-01-03 DIAGNOSIS — Y998 Other external cause status: Secondary | ICD-10-CM | POA: Diagnosis not present

## 2015-01-03 DIAGNOSIS — E119 Type 2 diabetes mellitus without complications: Secondary | ICD-10-CM | POA: Diagnosis not present

## 2015-01-03 DIAGNOSIS — G8929 Other chronic pain: Secondary | ICD-10-CM | POA: Diagnosis not present

## 2015-01-03 DIAGNOSIS — Z791 Long term (current) use of non-steroidal anti-inflammatories (NSAID): Secondary | ICD-10-CM | POA: Insufficient documentation

## 2015-01-03 DIAGNOSIS — Z720 Tobacco use: Secondary | ICD-10-CM | POA: Insufficient documentation

## 2015-01-03 DIAGNOSIS — Z23 Encounter for immunization: Secondary | ICD-10-CM | POA: Diagnosis not present

## 2015-01-03 DIAGNOSIS — S01511A Laceration without foreign body of lip, initial encounter: Secondary | ICD-10-CM | POA: Diagnosis not present

## 2015-01-03 DIAGNOSIS — I10 Essential (primary) hypertension: Secondary | ICD-10-CM | POA: Diagnosis not present

## 2015-01-03 DIAGNOSIS — M109 Gout, unspecified: Secondary | ICD-10-CM | POA: Diagnosis not present

## 2015-01-03 DIAGNOSIS — Y9289 Other specified places as the place of occurrence of the external cause: Secondary | ICD-10-CM | POA: Insufficient documentation

## 2015-01-03 DIAGNOSIS — F10929 Alcohol use, unspecified with intoxication, unspecified: Secondary | ICD-10-CM | POA: Diagnosis present

## 2015-01-03 DIAGNOSIS — Z79899 Other long term (current) drug therapy: Secondary | ICD-10-CM | POA: Insufficient documentation

## 2015-01-03 DIAGNOSIS — M1990 Unspecified osteoarthritis, unspecified site: Secondary | ICD-10-CM | POA: Diagnosis not present

## 2015-01-03 DIAGNOSIS — J449 Chronic obstructive pulmonary disease, unspecified: Secondary | ICD-10-CM | POA: Diagnosis not present

## 2015-01-03 DIAGNOSIS — Z7952 Long term (current) use of systemic steroids: Secondary | ICD-10-CM | POA: Diagnosis not present

## 2015-01-03 DIAGNOSIS — Y9389 Activity, other specified: Secondary | ICD-10-CM | POA: Insufficient documentation

## 2015-01-03 DIAGNOSIS — R52 Pain, unspecified: Secondary | ICD-10-CM

## 2015-01-03 LAB — CBC WITH DIFFERENTIAL/PLATELET
Basophils Absolute: 0 10*3/uL (ref 0.0–0.1)
Basophils Relative: 0 % (ref 0–1)
Eosinophils Absolute: 0.1 10*3/uL (ref 0.0–0.7)
Eosinophils Relative: 2 % (ref 0–5)
HCT: 40.9 % (ref 39.0–52.0)
Hemoglobin: 13.9 g/dL (ref 13.0–17.0)
Lymphocytes Relative: 32 % (ref 12–46)
Lymphs Abs: 2.2 10*3/uL (ref 0.7–4.0)
MCH: 30.5 pg (ref 26.0–34.0)
MCHC: 34 g/dL (ref 30.0–36.0)
MCV: 89.9 fL (ref 78.0–100.0)
MONO ABS: 0.5 10*3/uL (ref 0.1–1.0)
Monocytes Relative: 7 % (ref 3–12)
Neutro Abs: 4 10*3/uL (ref 1.7–7.7)
Neutrophils Relative %: 59 % (ref 43–77)
PLATELETS: 276 10*3/uL (ref 150–400)
RBC: 4.55 MIL/uL (ref 4.22–5.81)
RDW: 14.7 % (ref 11.5–15.5)
WBC: 6.7 10*3/uL (ref 4.0–10.5)

## 2015-01-03 LAB — BASIC METABOLIC PANEL
Anion gap: 14 (ref 5–15)
BUN: 30 mg/dL — ABNORMAL HIGH (ref 6–23)
CHLORIDE: 106 mmol/L (ref 96–112)
CO2: 19 mmol/L (ref 19–32)
CREATININE: 1.49 mg/dL — AB (ref 0.50–1.35)
Calcium: 9.2 mg/dL (ref 8.4–10.5)
GFR calc Af Amer: 61 mL/min — ABNORMAL LOW (ref >90)
GFR calc non Af Amer: 52 mL/min — ABNORMAL LOW (ref >90)
GLUCOSE: 114 mg/dL — AB (ref 70–99)
Potassium: 4 mmol/L (ref 3.5–5.1)
Sodium: 139 mmol/L (ref 135–145)

## 2015-01-03 LAB — ETHANOL: Alcohol, Ethyl (B): 231 mg/dL — ABNORMAL HIGH (ref 0–9)
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
## 2015-01-03 MED ORDER — SODIUM CHLORIDE 0.9 % IV BOLUS (SEPSIS)
1000.0000 mL | Freq: Once | INTRAVENOUS | Status: AC
  Administered 2015-01-03: 1000 mL via INTRAVENOUS

## 2015-01-03 MED ORDER — TETANUS-DIPHTH-ACELL PERTUSSIS 5-2.5-18.5 LF-MCG/0.5 IM SUSP
0.5000 mL | Freq: Once | INTRAMUSCULAR | Status: AC
  Administered 2015-01-03: 0.5 mL via INTRAMUSCULAR
  Filled 2015-01-03: qty 0.5
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
## 2015-01-03 NOTE — ED Notes (Signed)
Returned from Xray and CT.
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
## 2015-01-04 ENCOUNTER — Emergency Department (HOSPITAL_COMMUNITY): Payer: Medicaid Other

## 2015-01-04 LAB — CBG MONITORING, ED: GLUCOSE-CAPILLARY: 120 mg/dL — AB (ref 70–99)
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
## 2015-01-04 MED ORDER — ONDANSETRON HCL 4 MG PO TABS: 4.0000 mg | ORAL_TABLET | Freq: Three times a day (TID) | ORAL | Status: DC | PRN

## 2015-01-04 MED ORDER — ALUM & MAG HYDROXIDE-SIMETH 200-200-20 MG/5ML PO SUSP: 30.0000 mL | ORAL | Status: DC | PRN

## 2015-01-04 MED ORDER — IBUPROFEN 400 MG PO TABS: 600.0000 mg | ORAL_TABLET | Freq: Three times a day (TID) | ORAL | Status: DC | PRN

## 2015-01-04 MED ORDER — AMLODIPINE BESYLATE 5 MG PO TABS: 5.0000 mg | ORAL_TABLET | Freq: Every day | ORAL | Status: DC

## 2015-01-04 MED ORDER — LOSARTAN POTASSIUM 50 MG PO TABS
100.0000 mg | ORAL_TABLET | Freq: Every morning | ORAL | Status: DC
  Filled 2015-01-04: qty 2

## 2015-01-04 MED ORDER — ALBUTEROL SULFATE HFA 108 (90 BASE) MCG/ACT IN AERS: 1.0000 | INHALATION_SPRAY | Freq: Four times a day (QID) | RESPIRATORY_TRACT | Status: DC | PRN

## 2015-01-04 MED ORDER — ATENOLOL 25 MG PO TABS: 100.0000 mg | ORAL_TABLET | Freq: Every morning | ORAL | Status: DC

## 2015-01-04 NOTE — Discharge Instructions (Signed)
Alcohol Intoxication Alcohol intoxication occurs when the amount of alcohol that a person has consumed impairs his or her ability to mentally and physically function. Alcohol directly impairs the normal chemical activity of the brain. Drinking large amounts of alcohol can lead to changes in mental function and behavior, and it can cause many physical effects that can be harmful.  Alcohol intoxication can range in severity from mild to very severe. Various factors can affect the level of intoxication that occurs, such as the person's age, gender, weight, frequency of alcohol consumption, and the presence of other medical conditions (such as diabetes, seizures, or heart conditions). Dangerous levels of alcohol intoxication may occur when people drink large amounts of alcohol in a short period (binge drinking). Alcohol can also be especially dangerous when combined with certain prescription medicines or "recreational" drugs. SIGNS AND SYMPTOMS Some common signs and symptoms of mild alcohol intoxication include:  Loss of coordination.  Changes in mood and behavior.  Impaired judgment.  Slurred speech. As alcohol intoxication progresses to more severe levels, other signs and symptoms will appear. These may include:  Vomiting.  Confusion and impaired memory.  Slowed breathing.  Seizures.  Loss of consciousness. DIAGNOSIS  Your health care provider will take a medical history and perform a physical exam. You will be asked about the amount and type of alcohol you have consumed. Blood tests will be done to measure the concentration of alcohol in your blood. In many places, your blood alcohol level must be lower than 80 mg/dL (1.61%0.08%) to legally drive. However, many dangerous effects of alcohol can occur at much lower levels.  TREATMENT  People with alcohol intoxication often do not require treatment. Most of the effects of alcohol intoxication are temporary, and they go away as the alcohol naturally  leaves the body. Your health care provider will monitor your condition until you are stable enough to go home. Fluids are sometimes given through an IV access tube to help prevent dehydration.  HOME CARE INSTRUCTIONS  Do not drive after drinking alcohol.  Stay hydrated. Drink enough water and fluids to keep your urine clear or pale yellow. Avoid caffeine.   Only take over-the-counter or prescription medicines as directed by your health care provider.  SEEK MEDICAL CARE IF:   You have persistent vomiting.   You do not feel better after a few days.  You have frequent alcohol intoxication. Your health care provider can help determine if you should see a substance use treatment counselor. SEEK IMMEDIATE MEDICAL CARE IF:   You become shaky or tremble when you try to stop drinking.   You shake uncontrollably (seizure).   You throw up (vomit) blood. This may be bright red or may look like black coffee grounds.   You have blood in your stool. This may be bright red or may appear as a black, tarry, bad smelling stool.   You become lightheaded or faint.  MAKE SURE YOU:   Understand these instructions.  Will watch your condition.  Will get help right away if you are not doing well or get worse. Document Released: 08/29/2005 Document Revised: 07/22/2013 Document Reviewed: 04/24/2013 [facility] Patient Information 2015 [place], [place]. This information is not intended to replace advice given to you by your health care provider. Make sure you discuss any questions you have with your health care provider.   Open Wound, Lip An open wound is a break in the skin caused by an injury. An open wound to the lip can be a  scrape, cut, or hole (puncture). Good wound care will help:   Lessen pain.  Prevent infection.  Lessen scarring. HOME CARE  Wash off all dirt.  Clean your wounds daily with gentle soap and water.  Eat soft foods or liquids while your wound is healing.  Rinse the  wound with salt water after each meal.  Apply medicated cream after the wound has been cleaned as told by your doctor.  Follow up with your doctor as told. GET HELP RIGHT AWAY IF:   There is more redness or puffiness (swelling) in or around the wound.  There is increasing pain.  You have a temperature by mouth above 102 F (38.9 C), not controlled by medicine.  Your baby is older than 3 months with a rectal temperature of 102 F (38.9 C) or higher.  Your baby is 78 months old or younger with a rectal temperature of 100.4 F (38 C) or higher.  There is yellowish white fluid (pus) coming from the wound.  Very bad pain develops that is not controlled with medicine.  There is a red line on the skin above or below the wound. MAKE SURE YOU:   Understand these instructions.  Will watch this condition.  Will get help right away if you are not doing well or get worse. Document Released: 02/15/2009 Document Revised: 02/11/2012 Document Reviewed: 03/07/2010 [facility] Patient Information 2015 [place], [place]. This information is not intended to replace advice given to you by your health care provider. Make sure you discuss any questions you have with your health care provider. Head Injury You have received a head injury. It does not appear serious at this time. Headaches and vomiting are common following head injury. It should be easy to awaken from sleeping. Sometimes it is necessary for you to stay in the emergency department for a while for observation. Sometimes admission to the hospital may be needed. After injuries such as yours, most problems occur within the first 24 hours, but side effects may occur up to 7-10 days after the injury. It is important for you to carefully monitor your condition and contact your health care provider or seek immediate medical care if there is a change in your condition. WHAT ARE THE TYPES OF HEAD INJURIES? Head injuries can be as minor as a bump. Some head  injuries can be more severe. More severe head injuries include:  A jarring injury to the brain (concussion).  A bruise of the brain (contusion). This mean there is bleeding in the brain that can cause swelling.  A cracked skull (skull fracture).  Bleeding in the brain that collects, clots, and forms a bump (hematoma). WHAT CAUSES A HEAD INJURY? A serious head injury is most likely to happen to someone who is in a car wreck and is not wearing a seat belt. Other causes of major head injuries include bicycle or motorcycle accidents, sports injuries, and falls. HOW ARE HEAD INJURIES DIAGNOSED? A complete history of the event leading to the injury and your current symptoms will be helpful in diagnosing head injuries. Many times, pictures of the brain, such as CT or MRI are needed to see the extent of the injury. Often, an overnight hospital stay is necessary for observation.  WHEN SHOULD I SEEK IMMEDIATE MEDICAL CARE?  You should get help right away if:  You have confusion or drowsiness.  You feel sick to your stomach (nauseous) or have continued, forceful vomiting.  You have dizziness or unsteadiness that is getting worse.  You have severe, continued headaches not relieved by medicine. Only take over-the-counter or prescription medicines for pain, fever, or discomfort as directed by your health care provider.  You do not have normal function of the arms or legs or are unable to walk.  You notice changes in the black spots in the center of the colored part of your eye (pupil).  You have a clear or bloody fluid coming from your nose or ears.  You have a loss of vision. During the next 24 hours after the injury, you must stay with someone who can watch you for the warning signs. This person should contact local emergency services (911 in the [place] if you have seizures, you become unconscious, or you are unable to wake up. HOW CAN I PREVENT A HEAD INJURY IN THE FUTURE? The most important  factor for preventing major head injuries is avoiding motor vehicle accidents. To minimize the potential for damage to your head, it is crucial to wear seat belts while riding in motor vehicles. Wearing helmets while bike riding and playing collision sports (like football) is also helpful. Also, avoiding dangerous activities around the house will further help reduce your risk of head injury.  WHEN CAN I RETURN TO NORMAL ACTIVITIES AND ATHLETICS? You should be reevaluated by your health care provider before returning to these activities. If you have any of the following symptoms, you should not return to activities or contact sports until 1 week after the symptoms have stopped:  Persistent headache.  Dizziness or vertigo.  Poor attention and concentration.  Confusion.  Memory problems.  Nausea or vomiting.  Fatigue or tire easily.  Irritability.  Intolerant of bright lights or loud noises.  Anxiety or depression.  Disturbed sleep. MAKE SURE YOU:   Understand these instructions.  Will watch your condition.  Will get help right away if you are not doing well or get worse. Document Released: 11/19/2005 Document Revised: 11/24/2013 Document Reviewed: 07/27/2013 [facility] Patient Information 2015 [place], [place]. This information is not intended to replace advice given to you by your health care provider. Make sure you discuss any questions you have with your health care provider.

## 2015-01-04 NOTE — ED Notes (Signed)
Pt called out wanting to ask if anyone had tried to clean up his lip. This RN told the pt that she was not sure how much they were able to clean him up as he became combative with the doctor. This RN asked the pt if was ok to check his blood sugar, and pt consented. Foye ClockKristina, CNA at bedside.

## 2015-01-04 NOTE — ED Notes (Signed)
Pt states he does not want ED staff to talk to anyone on the phone including his wife but states his wife can come visit him in the ED.

## 2015-01-04 NOTE — ED Notes (Signed)
Jacubowitz, MD at bedside.  

## 2015-01-04 NOTE — ED Notes (Signed)
Per Ethelda ChickJacubowitz, MD pt to be ambulated at 0500 to assess his gait stability.

## 2015-01-04 NOTE — ED Provider Notes (Signed)
4 AM patient sleeping easily arousable and cooperative moves all extremities.  7:50 AM patient is alert ambulate without assistance. Has slight limp favoring right leg which is chronic for him. He complains of left anterior chest pain worse with changing positions. Denies shortness of breath. On reexamination lungs clear auscultation chest is tender anteriorly left side or flail abdomen soft nontender. Chest xray ordered. Pt signed out to Dr . Rubin PayorPickering at [facility]  Abbigayle Toole, MD 01/04/15 [id number]

## 2015-01-04 NOTE — ED Provider Notes (Signed)
  Physical Exam  BP 144/86 mmHg  Pulse 74  Temp(Src) 97.1 F (36.2 C) (Axillary)  Resp 16  SpO2 96%  Physical Exam  ED Course  Procedures  MDM cxr negative will d/c      Harrold DonathNathan R. Rubin PayorPickering, MD 01/04/15 [phone]

## 2015-01-04 NOTE — ED Notes (Signed)
This RN and nurse tech ambulated with pt to the bathroom. Pt states that he usually walks with a cane. Pt was highly unsteady when walking. Pt seemed to lose his balance easily, though was able to correct himself and walk. Pt able to stand in one position as long as he was leaning up against something. Ethelda ChickJacubowitz, MD notified.

## 2015-01-04 NOTE — ED Notes (Signed)
PT'S FIANCE' CALLED - [phone] - ADVISED HER PT HAS GONE TO X-RAY AND HE MAY CALL HER BACK WHEN HE RETURNS. SHE ADVISED SOMEONE WILL BE ABLE TO COME TO ED TO PICK HIM UP.

## 2015-01-20 ENCOUNTER — Ambulatory Visit: Payer: Medicaid Other | Admitting: Diagnostic Neuroimaging

## 2015-01-21 ENCOUNTER — Encounter: Payer: Self-pay | Admitting: Diagnostic Neuroimaging

## 2015-01-23 ENCOUNTER — Encounter (HOSPITAL_COMMUNITY): Payer: Self-pay

## 2015-01-23 ENCOUNTER — Emergency Department (HOSPITAL_COMMUNITY)
Admission: EM | Admit: 2015-01-23 | Discharge: 2015-01-24 | Disposition: A | Discharge status: Home / Self Care | Payer: Medicaid Other | Attending: Emergency Medicine | Admitting: Emergency Medicine

## 2015-01-23 DIAGNOSIS — Z720 Tobacco use: Secondary | ICD-10-CM | POA: Diagnosis not present

## 2015-01-23 DIAGNOSIS — I10 Essential (primary) hypertension: Secondary | ICD-10-CM | POA: Insufficient documentation

## 2015-01-23 DIAGNOSIS — I200 Unstable angina: Secondary | ICD-10-CM | POA: Insufficient documentation

## 2015-01-23 DIAGNOSIS — E119 Type 2 diabetes mellitus without complications: Secondary | ICD-10-CM | POA: Insufficient documentation

## 2015-01-23 DIAGNOSIS — Z79899 Other long term (current) drug therapy: Secondary | ICD-10-CM | POA: Insufficient documentation

## 2015-01-23 DIAGNOSIS — F10120 Alcohol abuse with intoxication, uncomplicated: Secondary | ICD-10-CM | POA: Insufficient documentation

## 2015-01-23 DIAGNOSIS — J449 Chronic obstructive pulmonary disease, unspecified: Secondary | ICD-10-CM | POA: Diagnosis not present

## 2015-01-23 DIAGNOSIS — M1990 Unspecified osteoarthritis, unspecified site: Secondary | ICD-10-CM | POA: Diagnosis not present

## 2015-01-23 DIAGNOSIS — G8929 Other chronic pain: Secondary | ICD-10-CM | POA: Diagnosis not present

## 2015-01-23 DIAGNOSIS — Z7952 Long term (current) use of systemic steroids: Secondary | ICD-10-CM | POA: Diagnosis not present

## 2015-01-23 DIAGNOSIS — F10920 Alcohol use, unspecified with intoxication, uncomplicated: Secondary | ICD-10-CM

## 2015-01-23 MED ORDER — LORAZEPAM 2 MG/ML IJ SOLN
1.0000 mg | Freq: Once | INTRAMUSCULAR | Status: AC
  Administered 2015-01-23: 1 mg via INTRAMUSCULAR
  Filled 2015-01-23: qty 1

## 2015-01-23 NOTE — Discharge Instructions (Signed)
Alcohol Intoxication °Alcohol intoxication occurs when the amount of alcohol that a person has consumed impairs his or her ability to mentally and physically function. Alcohol directly impairs the normal chemical activity of the brain. Drinking large amounts of alcohol can lead to changes in mental function and behavior, and it can cause many physical effects that can be harmful.  °Alcohol intoxication can range in severity from mild to very severe. Various factors can affect the level of intoxication that occurs, such as the person's age, gender, weight, frequency of alcohol consumption, and the presence of other medical conditions (such as diabetes, seizures, or heart conditions). Dangerous levels of alcohol intoxication may occur when people drink large amounts of alcohol in a short period (binge drinking). Alcohol can also be especially dangerous when combined with certain prescription medicines or "recreational" drugs. °SIGNS AND SYMPTOMS °Some common signs and symptoms of mild alcohol intoxication include: °· Loss of coordination. °· Changes in mood and behavior. °· Impaired judgment. °· Slurred speech. °As alcohol intoxication progresses to more severe levels, other signs and symptoms will appear. These may include: °· Vomiting. °· Confusion and impaired memory. °· Slowed breathing. °· Seizures. °· Loss of consciousness. °DIAGNOSIS  °Your health care provider will take a medical history and perform a physical exam. You will be asked about the amount and type of alcohol you have consumed. Blood tests will be done to measure the concentration of alcohol in your blood. In many places, your blood alcohol level must be lower than 80 mg/dL (0.08%) to legally drive. However, many dangerous effects of alcohol can occur at much lower levels.  °TREATMENT  °People with alcohol intoxication often do not require treatment. Most of the effects of alcohol intoxication are temporary, and they go away as the alcohol naturally  leaves the body. Your health care provider will monitor your condition until you are stable enough to go home. Fluids are sometimes given through an IV access tube to help prevent dehydration.  °HOME CARE INSTRUCTIONS °· Do not drive after drinking alcohol. °· Stay hydrated. Drink enough water and fluids to keep your urine clear or pale yellow. Avoid caffeine.   °· Only take over-the-counter or prescription medicines as directed by your health care provider.   °SEEK MEDICAL CARE IF:  °· You have persistent vomiting.   °· You do not feel better after a few days. °· You have frequent alcohol intoxication. Your health care provider can help determine if you should see a substance use treatment counselor. °SEEK IMMEDIATE MEDICAL CARE IF:  °· You become shaky or tremble when you try to stop drinking.   °· You shake uncontrollably (seizure).   °· You throw up (vomit) blood. This may be bright red or may look like black coffee grounds.   °· You have blood in your stool. This may be bright red or may appear as a black, tarry, bad smelling stool.   °· You become lightheaded or faint.   °MAKE SURE YOU:  °· Understand these instructions. °· Will watch your condition. °· Will get help right away if you are not doing well or get worse. °Document Released: 08/29/2005 Document Revised: 07/22/2013 Document Reviewed: 04/24/2013 °Exit[employer]® Patient Information ©2015 [employer]. This information is not intended to replace advice given to you by your health care provider. Make sure you discuss any questions you have with your health care provider. ° °Alcohol Use Disorder °Alcohol use disorder is a mental disorder. It is not a one-time incident of heavy drinking. Alcohol use disorder is the excessive and   uncontrollable use of alcohol over time that leads to problems with functioning in one or more areas of daily living. People with this disorder risk harming themselves and others when they drink to excess. Alcohol use disorder also  can cause other mental disorders, such as mood and anxiety disorders, and serious physical problems. People with alcohol use disorder often misuse other drugs.  °Alcohol use disorder is common and widespread. Some people with this disorder drink alcohol to cope with or escape from negative life events. Others drink to relieve chronic pain or symptoms of mental illness. People with a family history of alcohol use disorder are at higher risk of losing control and using alcohol to excess.  °SYMPTOMS  °Signs and symptoms of alcohol use disorder may include the following:  °· Consumption of alcohol in larger amounts or over a longer period of time than intended. °· Multiple unsuccessful attempts to cut down or control alcohol use.   °· A great deal of time spent obtaining alcohol, using alcohol, or recovering from the effects of alcohol (hangover). °· A strong desire or urge to use alcohol (cravings).   °· Continued use of alcohol despite problems at work, school, or home because of alcohol use.   °· Continued use of alcohol despite problems in relationships because of alcohol use. °· Continued use of alcohol in situations when it is physically hazardous, such as driving a car. °· Continued use of alcohol despite awareness of a physical or psychological problem that is likely related to alcohol use. Physical problems related to alcohol use can involve the brain, heart, liver, stomach, and intestines. Psychological problems related to alcohol use include intoxication, depression, anxiety, psychosis, delirium, and dementia.   °· The need for increased amounts of alcohol to achieve the same desired effect, or a decreased effect from the consumption of the same amount of alcohol (tolerance). °· Withdrawal symptoms upon reducing or stopping alcohol use, or alcohol use to reduce or avoid withdrawal symptoms. Withdrawal symptoms include: °¨ Racing heart. °¨ Hand tremor. °¨ Difficulty  sleeping. °¨ Nausea. °¨ Vomiting. °¨ Hallucinations. °¨ Restlessness. °¨ Seizures. °DIAGNOSIS °Alcohol use disorder is diagnosed through an assessment by your health care provider. Your health care provider may start by asking three or four questions to screen for excessive or problematic alcohol use. To confirm a diagnosis of alcohol use disorder, at least two symptoms must be present within a 12-month period. The severity of alcohol use disorder depends on the number of symptoms: °· Mild--two or three. °· Moderate--four or five. °· Severe--six or more. °Your health care provider may perform a physical exam or use results from lab tests to see if you have physical problems resulting from alcohol use. Your health care provider may refer you to a mental health professional for evaluation. °TREATMENT  °Some people with alcohol use disorder are able to reduce their alcohol use to low-risk levels. Some people with alcohol use disorder need to quit drinking alcohol. When necessary, mental health professionals with specialized training in substance use treatment can help. Your health care provider can help you decide how severe your alcohol use disorder is and what type of treatment you need. The following forms of treatment are available:  °· Detoxification. Detoxification involves the use of prescription medicines to prevent alcohol withdrawal symptoms in the first week after quitting. This is important for people with a history of symptoms of withdrawal and for heavy drinkers who are likely to have withdrawal symptoms. Alcohol withdrawal can be dangerous and, in severe cases, cause death. Detoxification   is usually provided in a hospital or in-patient substance use treatment facility. °· Counseling or talk therapy. Talk therapy is provided by substance use treatment counselors. It addresses the reasons people use alcohol and ways to keep them from drinking again. The goals of talk therapy are to help people with alcohol  use disorder find healthy activities and ways to cope with life stress, to identify and avoid triggers for alcohol use, and to handle cravings, which can cause relapse. °· Medicines. Different medicines can help treat alcohol use disorder through the following actions: °¨ Decrease alcohol cravings. °¨ Decrease the positive reward response felt from alcohol use. °¨ Produce an uncomfortable physical reaction when alcohol is used (aversion therapy). °· Support groups. Support groups are run by people who have quit drinking. They provide emotional support, advice, and guidance. °These forms of treatment are often combined. Some people with alcohol use disorder benefit from intensive combination treatment provided by specialized substance use treatment centers. Both inpatient and outpatient treatment programs are available. °Document Released: 12/27/2004 Document Revised: 04/05/2014 Document Reviewed: 02/26/2013 °ExitCare® Patient Information ©2015 [employer]. This information is not intended to replace advice given to you by your health care provider. Make sure you discuss any questions you have with your health care provider. ° °

## 2015-01-23 NOTE — ED Notes (Signed)
Patient is talking out loud with not making much sense of the conversation intermittently.

## 2015-01-23 NOTE — ED Notes (Signed)
Pt is calm at this time and eating a sand which.

## 2015-01-23 NOTE — ED Notes (Signed)
Bed: [facility] Expected date: 01/23/15 Expected time: 7:46 PM Means of arrival: Ambulance Comments: etoh

## 2015-01-23 NOTE — ED Notes (Signed)
Pt resting quietly on his right side. Respirations are even, regular, and unlabored. RR: 16. Skin: dry.
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
## 2015-01-23 NOTE — ED Notes (Signed)
Pt is continually yelling out, "I want to go now."  Informed pt that he is to intoxicated to leave at this point.  Pt appears to be escalating verbally, cursing at this Clinical research associatewriter. Pt is attempting to get OOB, asked that pt remain in bed for his safety. Will inform MD.

## 2015-01-23 NOTE — ED Notes (Signed)
Pt ambulated unassisted w/o difficulty.
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
## 2015-01-24 NOTE — ED Notes (Signed)
Patient resting in position of comfort with eyes closed RR WNL--even and unlabored with equal rise and fall of chest Patient in NAD Side rails up 

## 2015-01-24 NOTE — ED Notes (Signed)
Patient resting quietly with eyes closed on right side. Appears in no distress. Respirations are even, regular, and unlabored. RR: 20.

## 2015-01-24 NOTE — ED Notes (Signed)
Pt resting quietly on his back with eyes closed. Appears in no distress. Respirations are even, regular, and unlabored.

## 2015-01-24 NOTE — ED Notes (Signed)
Pt can ambulate independently and is A&O x 4.  Pt given sprite and bus pass.

## 2016-01-06 ENCOUNTER — Emergency Department (HOSPITAL_COMMUNITY): Payer: Medicaid Other

## 2016-01-06 ENCOUNTER — Encounter (HOSPITAL_COMMUNITY): Payer: Self-pay

## 2016-01-06 ENCOUNTER — Inpatient Hospital Stay (HOSPITAL_COMMUNITY)
Admission: EM | Admit: 2016-01-06 | Discharge: 2016-01-08 | DRG: 641 | Disposition: A | Discharge status: Home / Self Care | Payer: Medicaid Other | Attending: Internal Medicine | Admitting: Internal Medicine

## 2016-01-06 DIAGNOSIS — F10129 Alcohol abuse with intoxication, unspecified: Secondary | ICD-10-CM | POA: Diagnosis present

## 2016-01-06 DIAGNOSIS — R4182 Altered mental status, unspecified: Secondary | ICD-10-CM

## 2016-01-06 DIAGNOSIS — F17210 Nicotine dependence, cigarettes, uncomplicated: Secondary | ICD-10-CM | POA: Diagnosis present

## 2016-01-06 DIAGNOSIS — E872 Acidosis, unspecified: Secondary | ICD-10-CM | POA: Insufficient documentation

## 2016-01-06 DIAGNOSIS — Z79899 Other long term (current) drug therapy: Secondary | ICD-10-CM | POA: Diagnosis not present

## 2016-01-06 DIAGNOSIS — E119 Type 2 diabetes mellitus without complications: Secondary | ICD-10-CM | POA: Diagnosis present

## 2016-01-06 DIAGNOSIS — E1142 Type 2 diabetes mellitus with diabetic polyneuropathy: Secondary | ICD-10-CM

## 2016-01-06 DIAGNOSIS — J449 Chronic obstructive pulmonary disease, unspecified: Secondary | ICD-10-CM | POA: Diagnosis present

## 2016-01-06 DIAGNOSIS — I10 Essential (primary) hypertension: Secondary | ICD-10-CM | POA: Diagnosis present

## 2016-01-06 DIAGNOSIS — Z9981 Dependence on supplemental oxygen: Secondary | ICD-10-CM

## 2016-01-06 DIAGNOSIS — F1010 Alcohol abuse, uncomplicated: Secondary | ICD-10-CM

## 2016-01-06 DIAGNOSIS — Y906 Blood alcohol level of 120-199 mg/100 ml: Secondary | ICD-10-CM | POA: Diagnosis present

## 2016-01-06 DIAGNOSIS — M109 Gout, unspecified: Secondary | ICD-10-CM | POA: Diagnosis present

## 2016-01-06 DIAGNOSIS — Z9114 Patient's other noncompliance with medication regimen: Secondary | ICD-10-CM

## 2016-01-06 DIAGNOSIS — G8929 Other chronic pain: Secondary | ICD-10-CM | POA: Diagnosis present

## 2016-01-06 DIAGNOSIS — G40409 Other generalized epilepsy and epileptic syndromes, not intractable, without status epilepticus: Secondary | ICD-10-CM | POA: Diagnosis present

## 2016-01-06 DIAGNOSIS — F17200 Nicotine dependence, unspecified, uncomplicated: Secondary | ICD-10-CM | POA: Diagnosis present

## 2016-01-06 DIAGNOSIS — Z833 Family history of diabetes mellitus: Secondary | ICD-10-CM

## 2016-01-06 DIAGNOSIS — E1165 Type 2 diabetes mellitus with hyperglycemia: Secondary | ICD-10-CM

## 2016-01-06 HISTORY — DX: Alcohol abuse, uncomplicated: F10.10

## 2016-01-06 LAB — RAPID URINE DRUG SCREEN, HOSP PERFORMED
Amphetamines: NOT DETECTED
BENZODIAZEPINES: NOT DETECTED
Barbiturates: NOT DETECTED
Cocaine: NOT DETECTED
OPIATES: NOT DETECTED
Tetrahydrocannabinol: NOT DETECTED

## 2016-01-06 LAB — URINALYSIS, ROUTINE W REFLEX MICROSCOPIC
BILIRUBIN URINE: NEGATIVE
GLUCOSE, UA: NEGATIVE mg/dL
Hgb urine dipstick: NEGATIVE
Ketones, ur: NEGATIVE mg/dL
Leukocytes, UA: NEGATIVE
NITRITE: NEGATIVE
PH: 5 (ref 5.0–8.0)
PROTEIN: NEGATIVE mg/dL
Specific Gravity, Urine: 1.005 (ref 1.005–1.030)

## 2016-01-06 LAB — GLUCOSE, CAPILLARY: Glucose-Capillary: 73 mg/dL (ref 65–99)

## 2016-01-06 LAB — CBC WITH DIFFERENTIAL/PLATELET
BASOS ABS: 0 10*3/uL (ref 0.0–0.1)
Basophils Relative: 0 %
Eosinophils Absolute: 0.1 10*3/uL (ref 0.0–0.7)
Eosinophils Relative: 2 %
HCT: 39.9 % (ref 39.0–52.0)
Hemoglobin: 13.6 g/dL (ref 13.0–17.0)
LYMPHS PCT: 25 %
Lymphs Abs: 1.3 10*3/uL (ref 0.7–4.0)
MCH: 31.3 pg (ref 26.0–34.0)
MCHC: 34.1 g/dL (ref 30.0–36.0)
MCV: 91.7 fL (ref 78.0–100.0)
MONO ABS: 0.5 10*3/uL (ref 0.1–1.0)
Monocytes Relative: 9 %
Neutro Abs: 3.3 10*3/uL (ref 1.7–7.7)
Neutrophils Relative %: 64 %
PLATELETS: 295 10*3/uL (ref 150–400)
RBC: 4.35 MIL/uL (ref 4.22–5.81)
RDW: 15.5 % (ref 11.5–15.5)
WBC: 5.1 10*3/uL (ref 4.0–10.5)

## 2016-01-06 LAB — COMPREHENSIVE METABOLIC PANEL
ALBUMIN: 4 g/dL (ref 3.5–5.0)
ALT: 21 U/L (ref 17–63)
AST: 33 U/L (ref 15–41)
Alkaline Phosphatase: 46 U/L (ref 38–126)
Anion gap: 15 (ref 5–15)
BUN: 21 mg/dL — ABNORMAL HIGH (ref 6–20)
CO2: 19 mmol/L — ABNORMAL LOW (ref 22–32)
Calcium: 9.3 mg/dL (ref 8.9–10.3)
Chloride: 103 mmol/L (ref 101–111)
Creatinine, Ser: 1.18 mg/dL (ref 0.61–1.24)
GFR calc non Af Amer: >60 mL/min (ref >60)
GLUCOSE: 97 mg/dL (ref 65–99)
POTASSIUM: 3.7 mmol/L (ref 3.5–5.1)
SODIUM: 137 mmol/L (ref 135–145)
Total Bilirubin: 0.9 mg/dL (ref 0.3–1.2)
Total Protein: 7.9 g/dL (ref 6.5–8.1)

## 2016-01-06 LAB — I-STAT CG4 LACTIC ACID, ED
LACTIC ACID, VENOUS: 3.28 mmol/L — AB (ref 0.5–2.0)
LACTIC ACID, VENOUS: 3.33 mmol/L — AB (ref 0.5–2.0)
Lactic Acid, Venous: 3.1 mmol/L (ref 0.5–2.0)

## 2016-01-06 LAB — SALICYLATE LEVEL

## 2016-01-06 LAB — ACETAMINOPHEN LEVEL: Acetaminophen (Tylenol), Serum: <10 ug/mL — ABNORMAL LOW (ref 10–30)

## 2016-01-06 LAB — ETHANOL: Alcohol, Ethyl (B): 194 mg/dL — ABNORMAL HIGH (ref <5)

## 2016-01-06 IMAGING — CR DG HIP (WITH OR WITHOUT PELVIS) 2-3V*L*
3 series · 3 of 3 positions shown · non-contrast
Comparison: None.

CLINICAL DATA: Left posterior chronic hip pain

EXAM:
DG HIP (WITH OR WITHOUT PELVIS) 2-3V LEFT

[t pelvis ap]
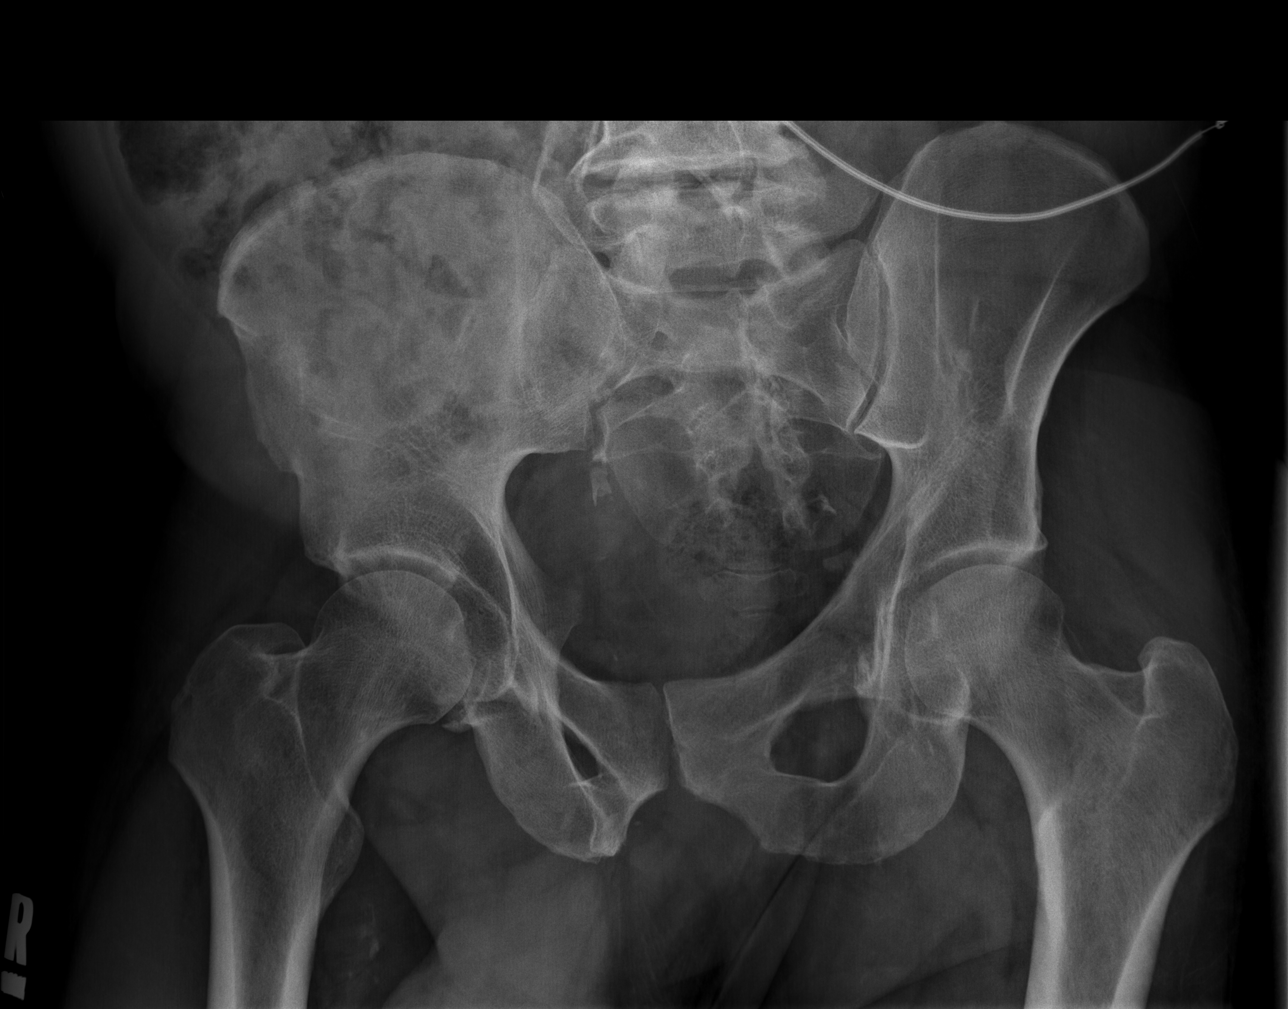

[t hip ap left]
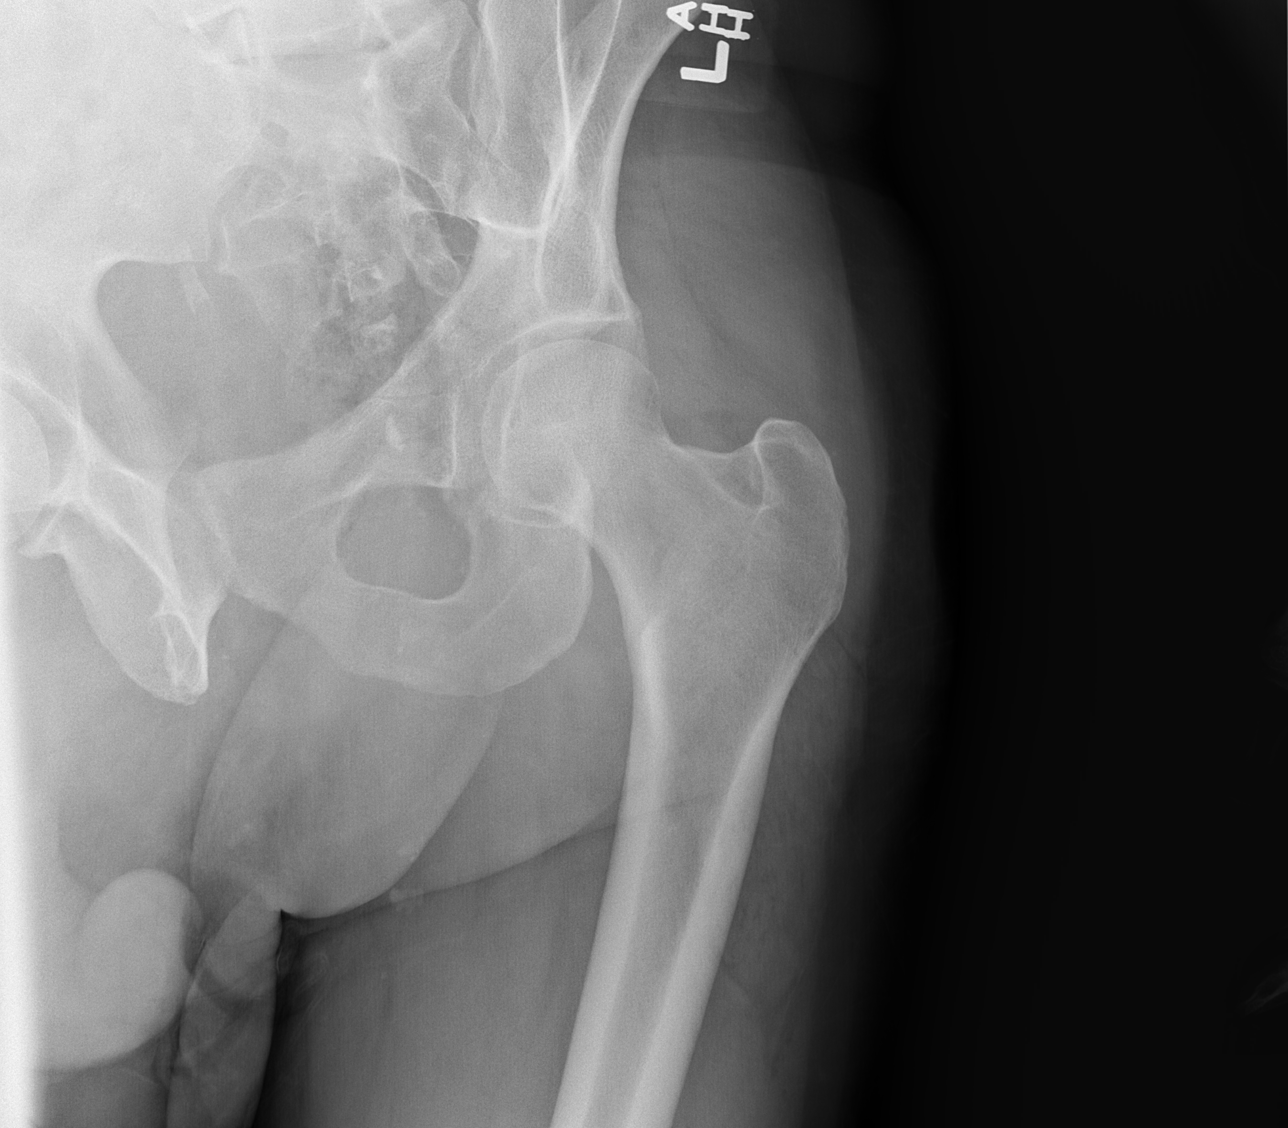

[t hip frog leg left]
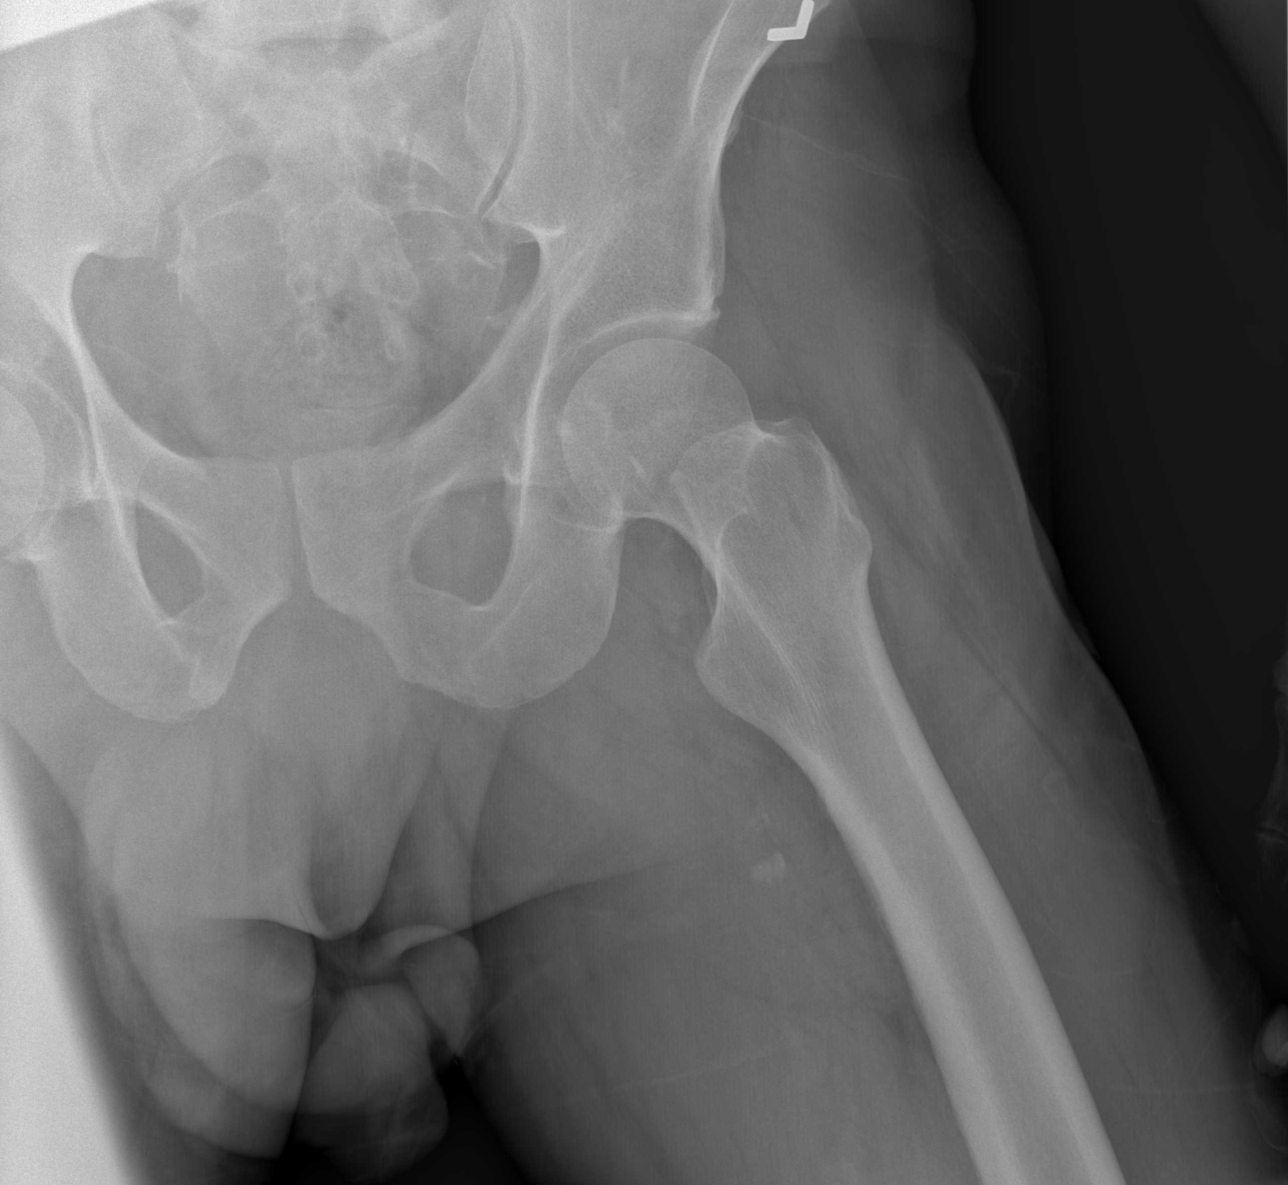

[3 of 3 positions shown; findings below may reference images not displayed]

FINDINGS: There is no evidence of hip fracture or dislocation. There is no
evidence of arthropathy or other focal bone abnormality. Pelvic
iliac and femoral atherosclerosis noted.
IMPRESSION: No acute finding by plain radiography.

Iliofemoral atherosclerosis

## 2016-01-06 IMAGING — CR DG CHEST 2V
2 series · 2 of 2 positions shown · non-contrast
Comparison: [DATE]

CLINICAL DATA: Altered mental status

EXAM:
CHEST  2 VIEW

[w chest lat]
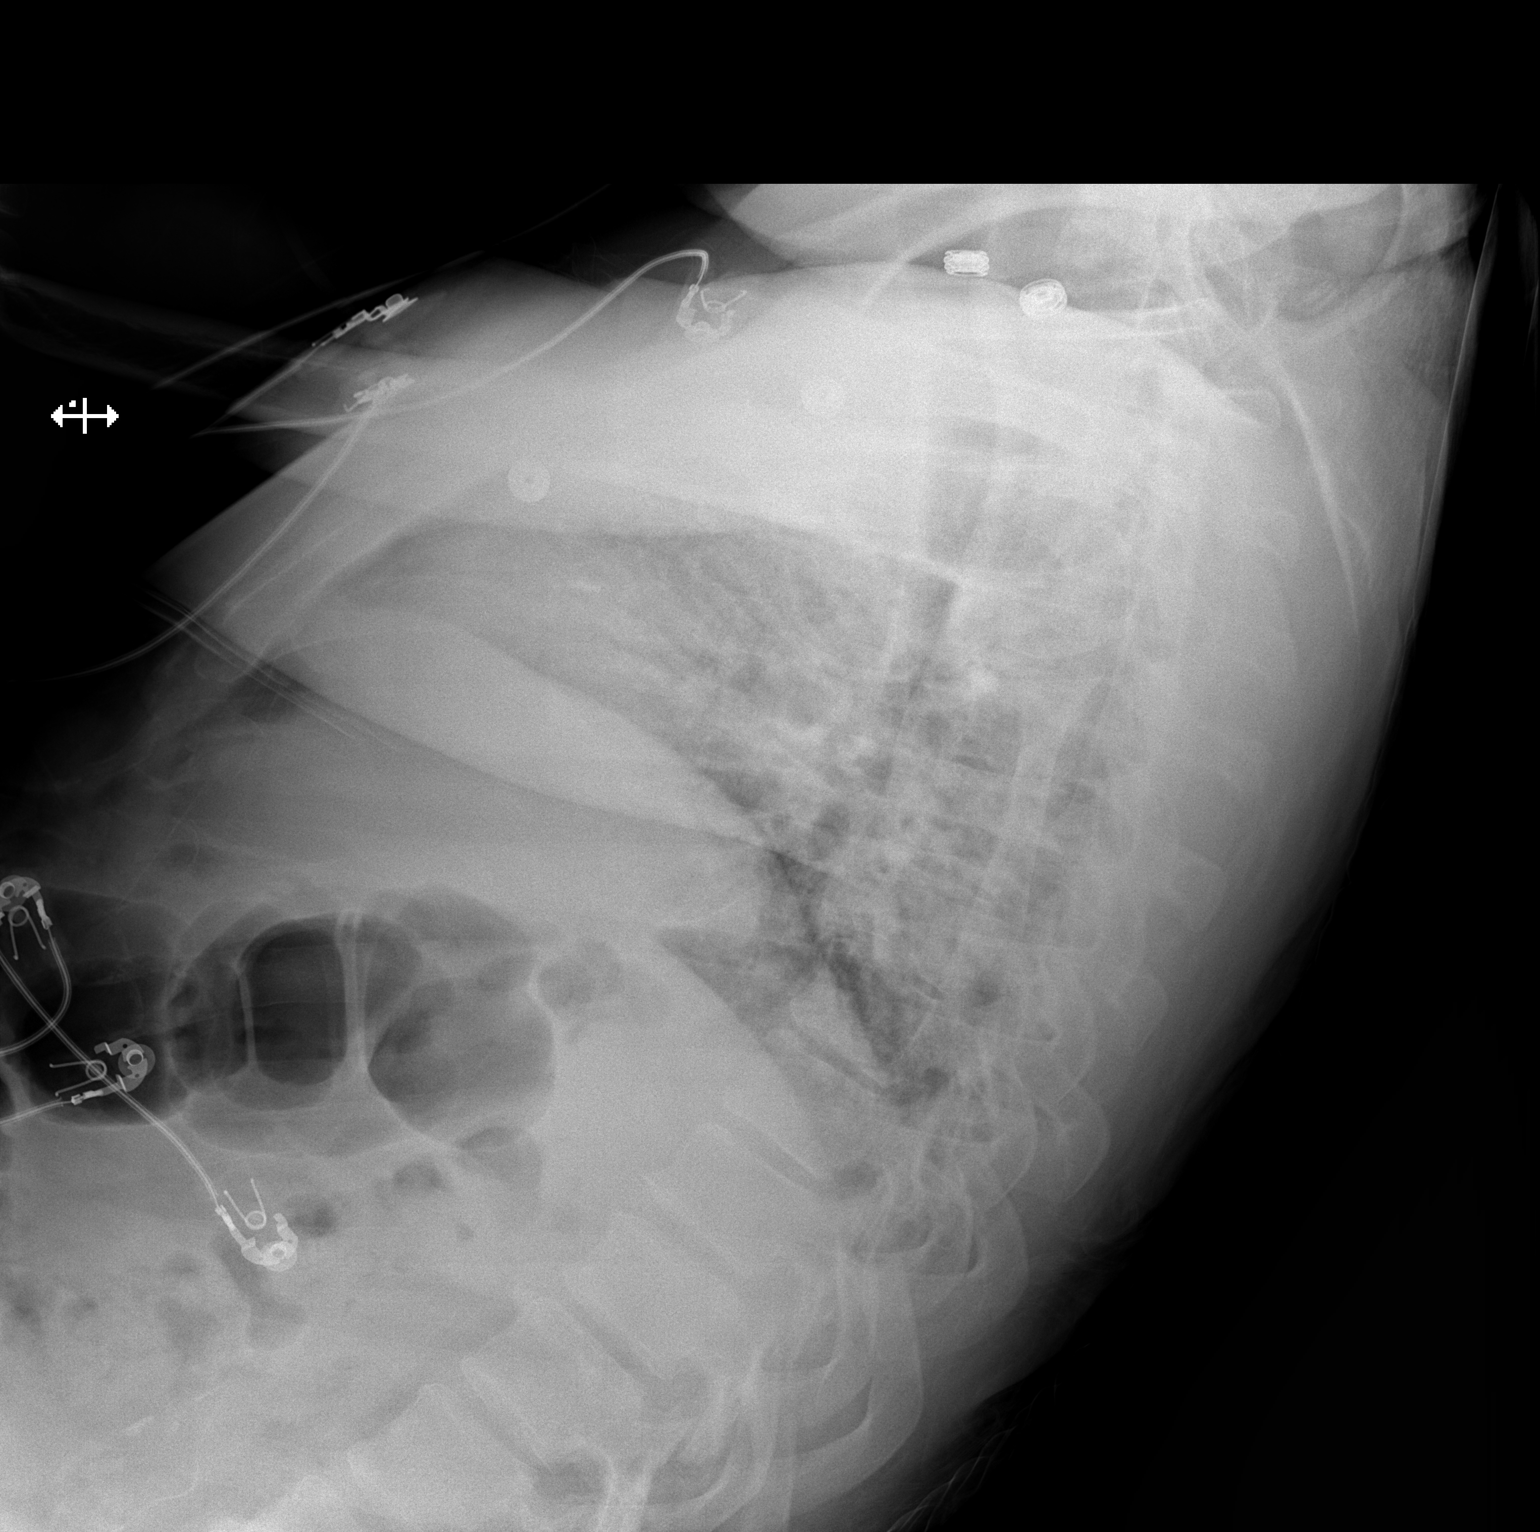

[x chest ap]
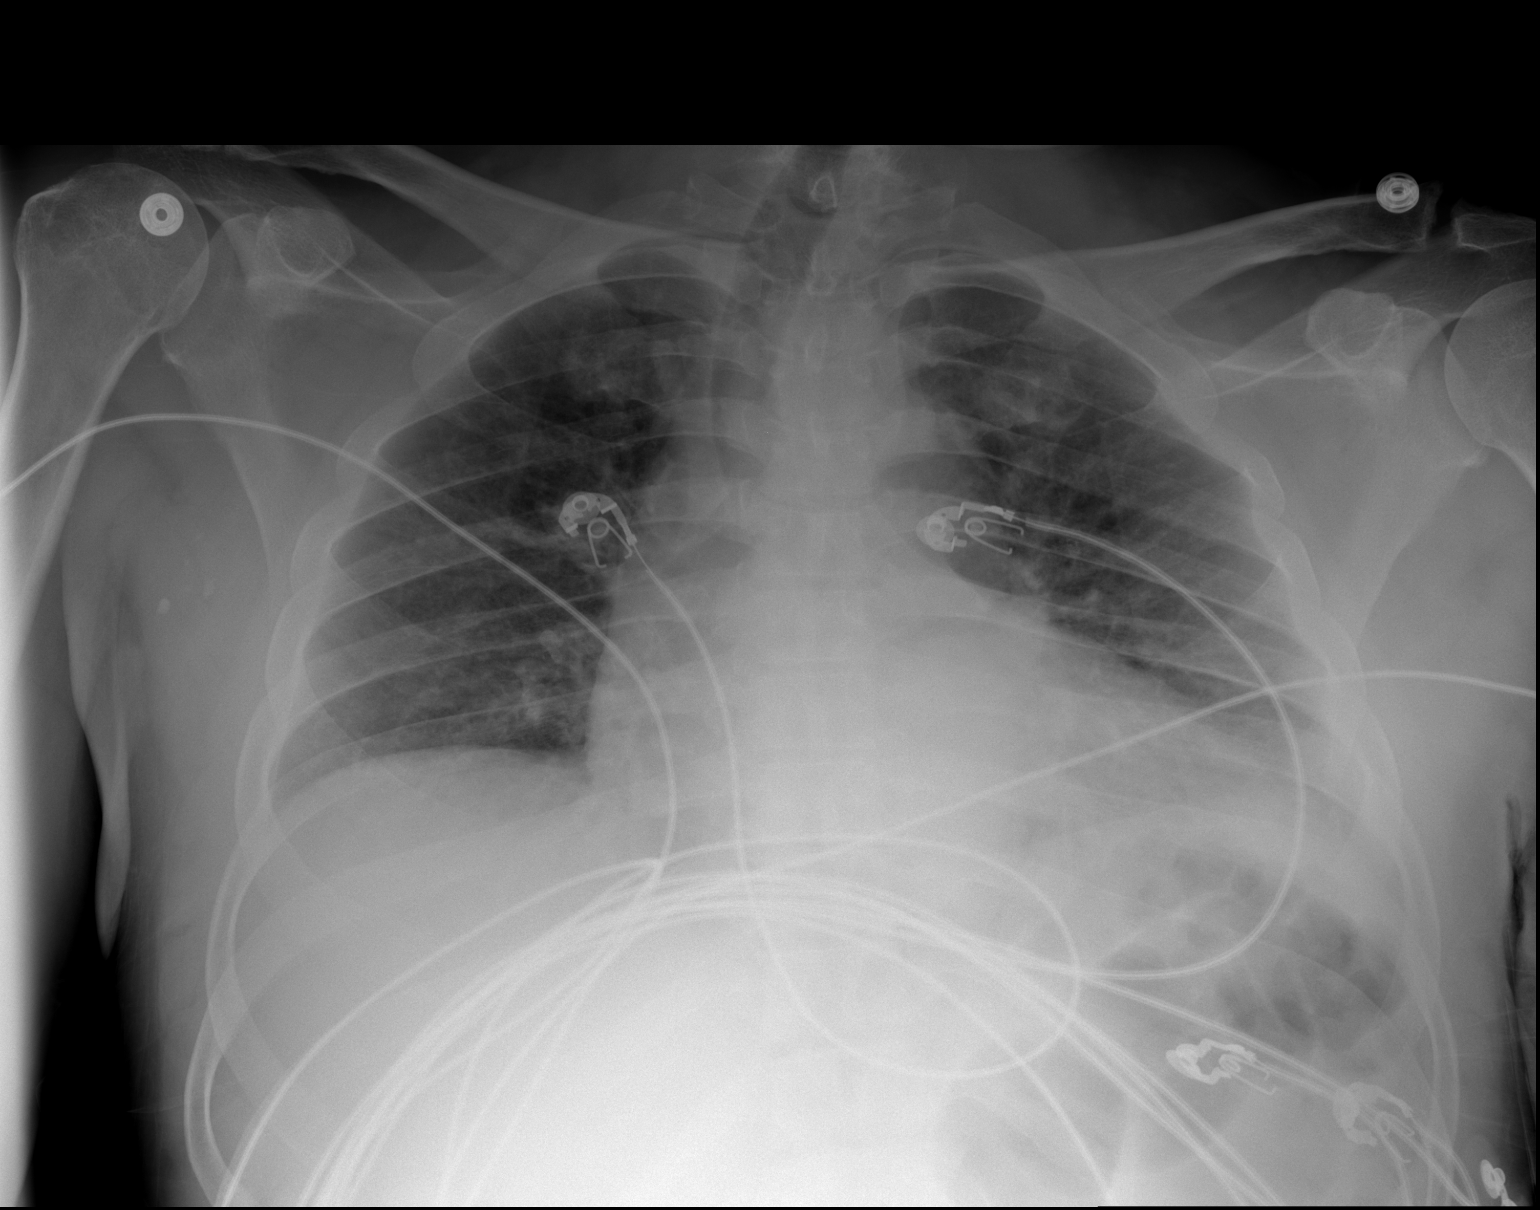

[2 of 2 positions shown; findings below may reference images not displayed]

FINDINGS: Cardiac shadow is mildly enlarged. The lungs are poorly aerated
although no focal infiltrate is seen. No sizable effusion is noted.
No bony abnormality is seen.
IMPRESSION: Poor inspiratory effort.  No acute abnormality noted.

## 2016-01-06 IMAGING — CT CT HEAD W/O CM
2 series · 17 of 30 positions shown, 20 images · non-contrast
Comparison: [DATE]

CLINICAL DATA: Seizure, altered mental status, intake of three 40
ounce beers and 3 shots of liquor, history hypertension, COPD,
asthma, diabetes mellitus

EXAM:
CT HEAD WITHOUT CONTRAST
TECHNIQUE: Contiguous axial images were obtained from the base of the skull
through the vertex without intravenous contrast.

[Series 2: head w/o · axial · non-contrast · 0.45mm/px · z∈[-120,-10]mm · 9 of 28 slices shown, 12 images]
[im 3/28  brain]
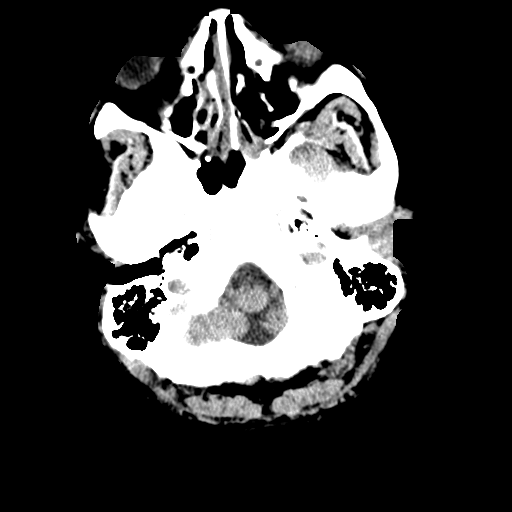
[im 3/28  bone]
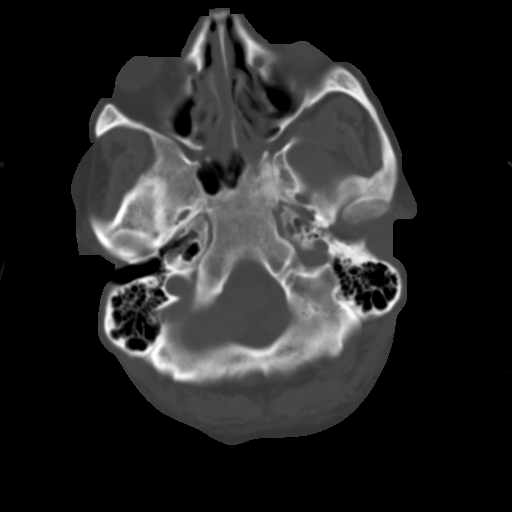
[im 6/28  brain]
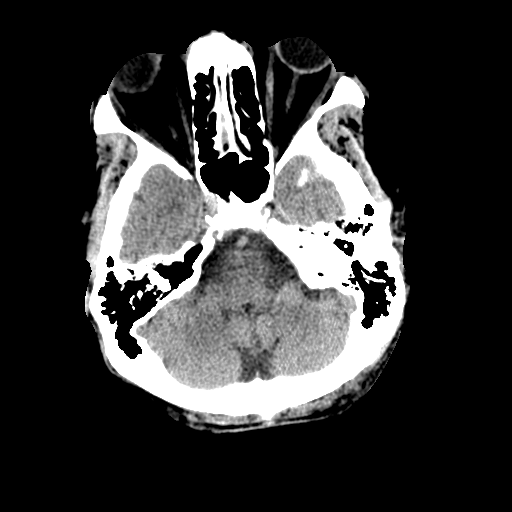
[im 9/28  brain]
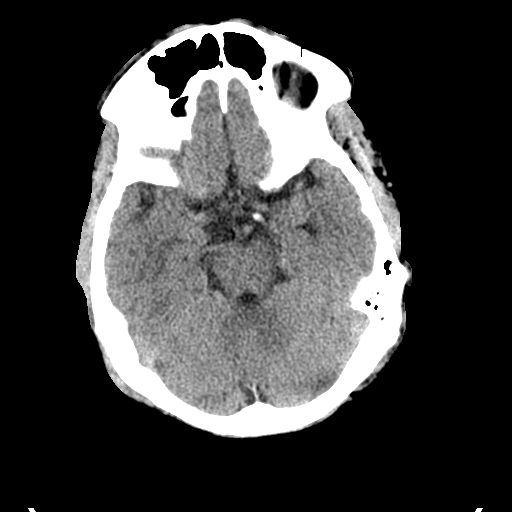
[im 11/28  brain]
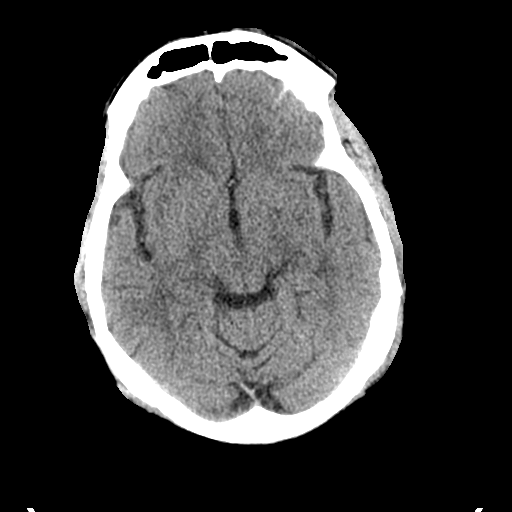
[im 14/28  brain]
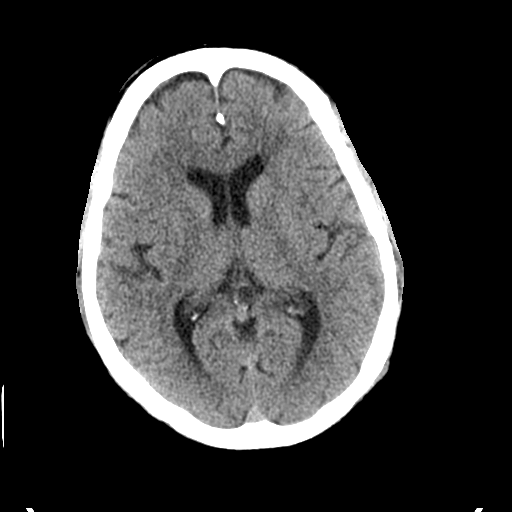
[im 14/28  bone]
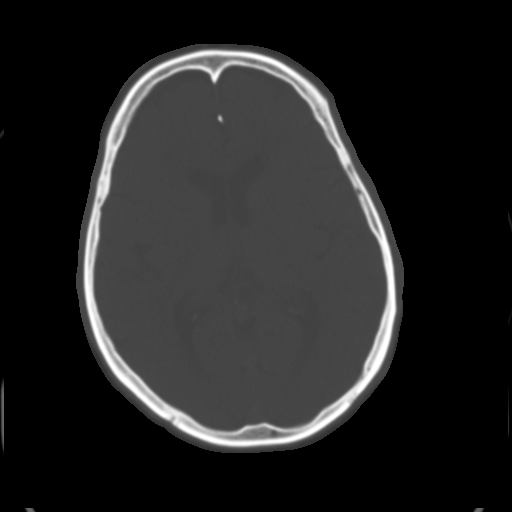
[im 17/28  brain]
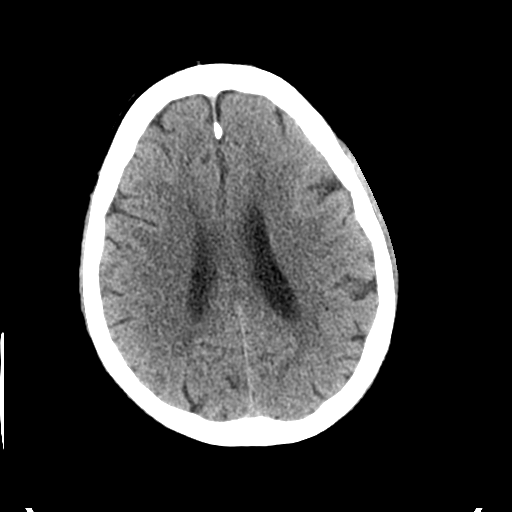
[im 19/28  brain]
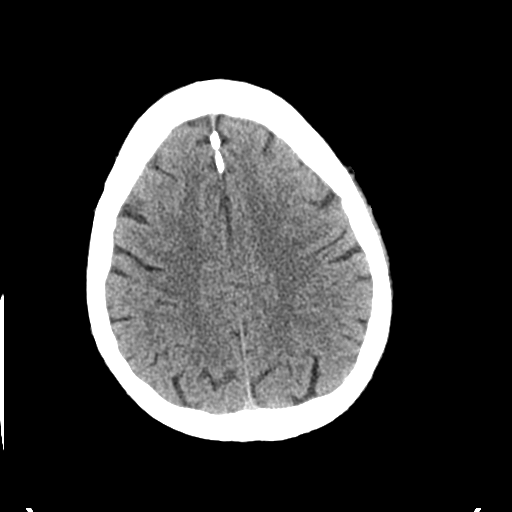
[im 22/28  brain]
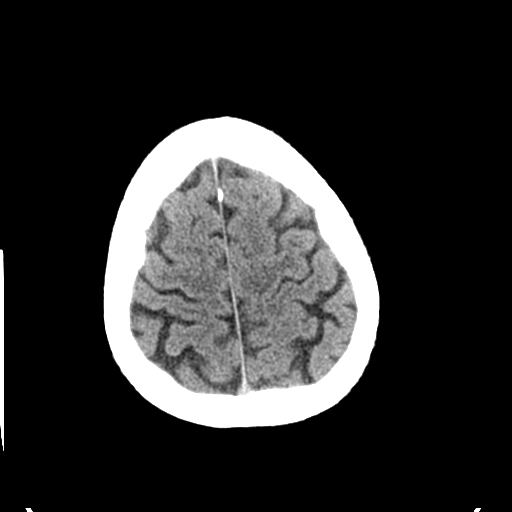
[im 25/28  brain]
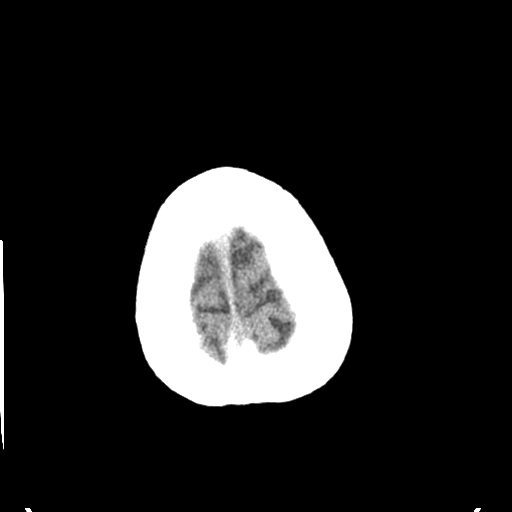
[im 25/28  bone]
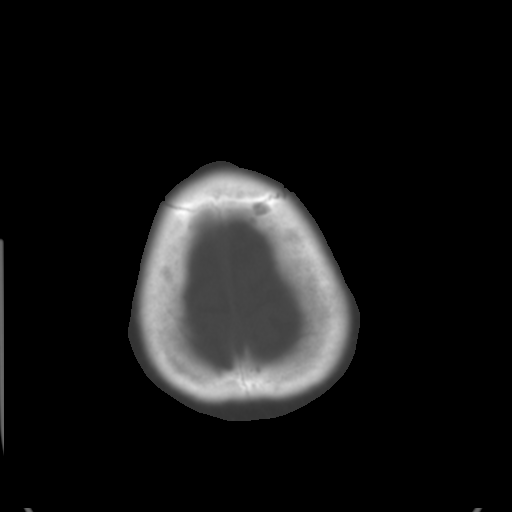

[Series 3: bone windows · axial · 0.45mm/px · z∈[-115,-10]mm · 8 of 46 slices shown]
[im 6/46  bone]
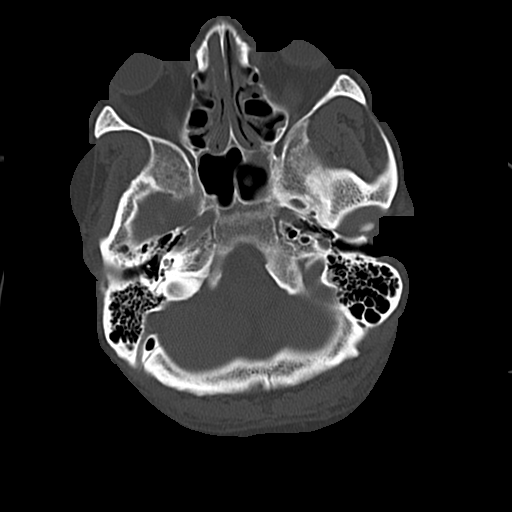
[im 11/46  bone]
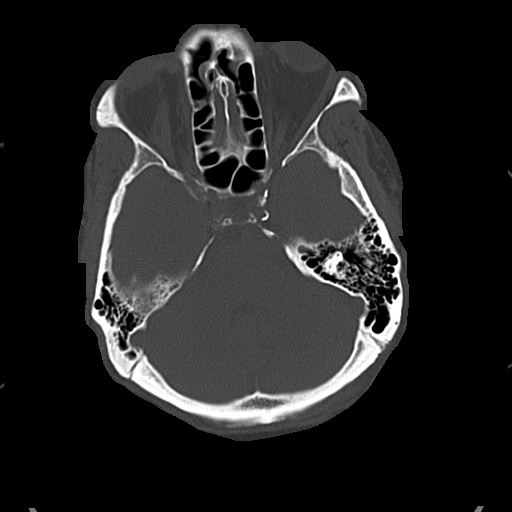
[im 16/46  bone]
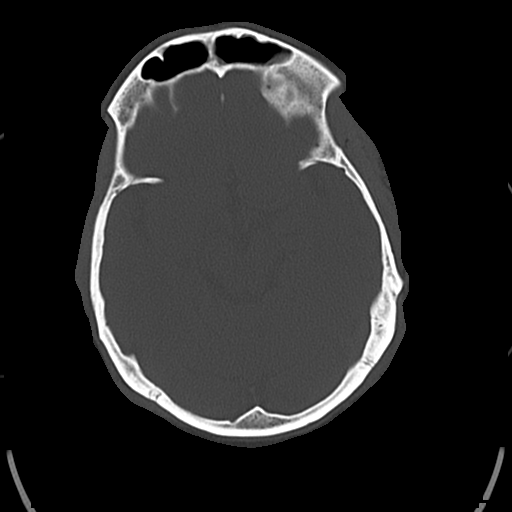
[im 21/46  bone]
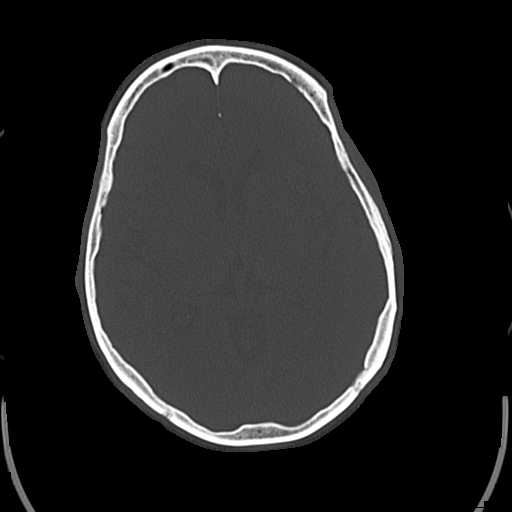
[im 26/46  bone]
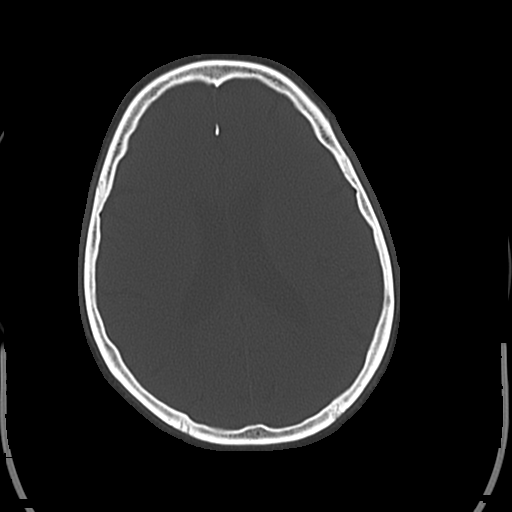
[im 31/46  bone]
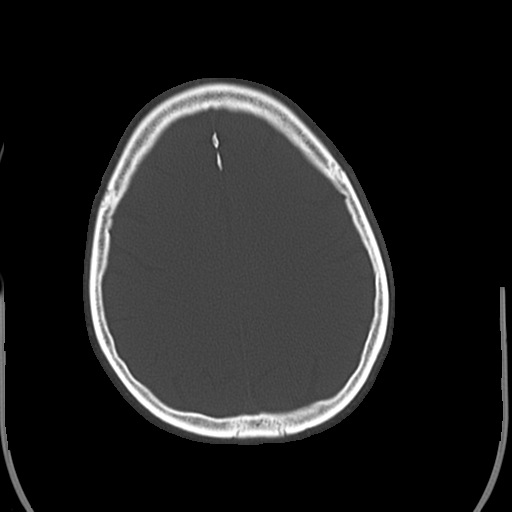
[im 36/46  bone]
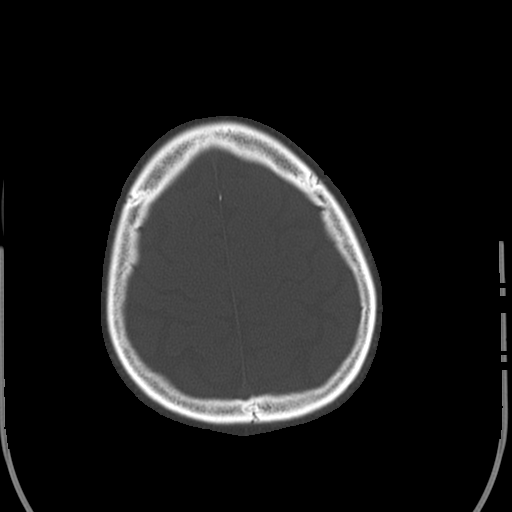
[im 41/46  bone]
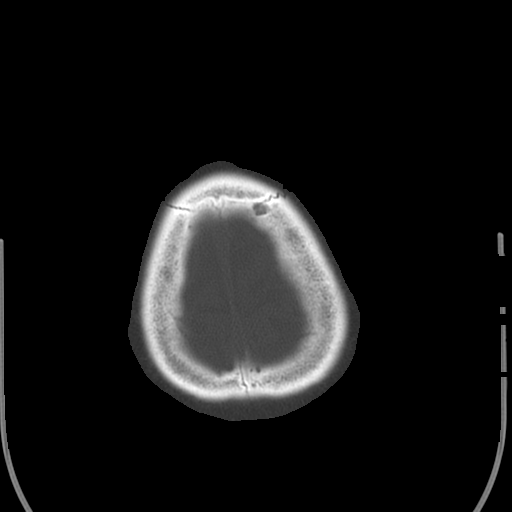

[17 of 30 positions shown; findings below may reference images not displayed]

FINDINGS: Mild atrophy.

Normal ventricular morphology.

No midline shift or mass effect.

Minimal small vessel chronic ischemic changes of deep cerebral white
matter.

No intracranial hemorrhage, mass lesion or evidence acute
infarction.

No extra-axial fluid collections.

Atherosclerotic calcifications at the carotid siphons.

Sinuses clear and bones unremarkable.
IMPRESSION: No acute intracranial abnormalities.

## 2016-01-06 MED ORDER — ATENOLOL 100 MG PO TABS
100.0000 mg | ORAL_TABLET | Freq: Every morning | ORAL | Status: DC
  Administered 2016-01-07 – 2016-01-08 (×2): 100 mg via ORAL
  Filled 2016-01-06 (×2): qty 1

## 2016-01-06 MED ORDER — ENOXAPARIN SODIUM 40 MG/0.4ML ~~LOC~~ SOLN
40.0000 mg | SUBCUTANEOUS | Status: DC
Start: 2016-01-06 — End: 2016-01-08
  Administered 2016-01-06: 40 mg via SUBCUTANEOUS
  Filled 2016-01-06 (×3): qty 0.4

## 2016-01-06 MED ORDER — FOLIC ACID 1 MG PO TABS
1.0000 mg | ORAL_TABLET | Freq: Every day | ORAL | Status: DC
Start: 2016-01-06 — End: 2016-01-08
  Administered 2016-01-06 – 2016-01-08 (×3): 1 mg via ORAL
  Filled 2016-01-06 (×3): qty 1

## 2016-01-06 MED ORDER — SODIUM CHLORIDE 0.9 % IV BOLUS (SEPSIS)
1000.0000 mL | Freq: Once | INTRAVENOUS | Status: AC
  Administered 2016-01-06: 1000 mL via INTRAVENOUS

## 2016-01-06 MED ORDER — LORAZEPAM 2 MG/ML IJ SOLN: 1.0000 mg | Freq: Four times a day (QID) | INTRAMUSCULAR | Status: DC | PRN

## 2016-01-06 MED ORDER — ADULT MULTIVITAMIN W/MINERALS CH
1.0000 | ORAL_TABLET | Freq: Every day | ORAL | Status: DC
  Administered 2016-01-06 – 2016-01-08 (×3): 1 via ORAL
  Filled 2016-01-06 (×3): qty 1

## 2016-01-06 MED ORDER — LOSARTAN POTASSIUM 50 MG PO TABS
100.0000 mg | ORAL_TABLET | Freq: Every morning | ORAL | Status: DC
Start: 2016-01-07 — End: 2016-01-08
  Administered 2016-01-07 – 2016-01-08 (×2): 100 mg via ORAL
  Filled 2016-01-06 (×2): qty 2

## 2016-01-06 MED ORDER — POTASSIUM CHLORIDE IN NACL 20-0.9 MEQ/L-% IV SOLN
INTRAVENOUS | Status: DC
  Administered 2016-01-06: 20:00:00 via INTRAVENOUS
  Filled 2016-01-06 (×5): qty 1000

## 2016-01-06 MED ORDER — THIAMINE HCL 100 MG/ML IJ SOLN
100.0000 mg | Freq: Every day | INTRAMUSCULAR | Status: DC
  Filled 2016-01-06 (×3): qty 1

## 2016-01-06 MED ORDER — PANTOPRAZOLE SODIUM 40 MG PO TBEC
40.0000 mg | DELAYED_RELEASE_TABLET | Freq: Every day | ORAL | Status: DC
  Administered 2016-01-06 – 2016-01-08 (×3): 40 mg via ORAL
  Filled 2016-01-06 (×3): qty 1

## 2016-01-06 MED ORDER — ALLOPURINOL 100 MG PO TABS
100.0000 mg | ORAL_TABLET | Freq: Every day | ORAL | Status: DC
  Administered 2016-01-06 – 2016-01-08 (×3): 100 mg via ORAL
  Filled 2016-01-06 (×3): qty 1

## 2016-01-06 MED ORDER — IPRATROPIUM-ALBUTEROL 0.5-2.5 (3) MG/3ML IN SOLN: 3.0000 mL | Freq: Four times a day (QID) | RESPIRATORY_TRACT | Status: DC | PRN

## 2016-01-06 MED ORDER — VITAMIN B-1 100 MG PO TABS
100.0000 mg | ORAL_TABLET | Freq: Every day | ORAL | Status: DC
  Administered 2016-01-06 – 2016-01-08 (×3): 100 mg via ORAL
  Filled 2016-01-06 (×3): qty 1

## 2016-01-06 MED ORDER — LORAZEPAM 1 MG PO TABS: 1.0000 mg | ORAL_TABLET | Freq: Four times a day (QID) | ORAL | Status: DC | PRN

## 2016-01-06 MED ORDER — SODIUM CHLORIDE 0.9% FLUSH
3.0000 mL | Freq: Two times a day (BID) | INTRAVENOUS | Status: DC
  Administered 2016-01-07 – 2016-01-08 (×2): 3 mL via INTRAVENOUS

## 2016-01-06 MED ORDER — MAGNESIUM SULFATE 2 GM/50ML IV SOLN
2.0000 g | Freq: Once | INTRAVENOUS | Status: AC
Start: 2016-01-06 — End: 2016-01-07
  Administered 2016-01-07: 2 g via INTRAVENOUS
  Filled 2016-01-06: qty 50

## 2016-01-06 NOTE — ED Notes (Signed)
Pt is starting to communicate. Pt is requesting something to eat and pain meds. RN aware

## 2016-01-06 NOTE — ED Notes (Signed)
Bed: AO13 Expected date: 01/06/16 Expected time: 7:34 AM Means of arrival: Ambulance Comments: AMS vs ETOH

## 2016-01-06 NOTE — ED Notes (Signed)
Patient transported to CT 

## 2016-01-06 NOTE — ED Notes (Signed)
Pt was provided meal tray
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
## 2016-01-06 NOTE — ED Notes (Signed)
Patient transported to X-ray 
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
## 2016-01-06 NOTE — Progress Notes (Signed)
PATIENT HAS HIS HOME MEDS WITH HIM-PLEASE GET THEM LOCKED UP IN THE PHARMACY

## 2016-01-06 NOTE — ED Notes (Signed)
Darryl Hurley/wife  [phone]   [facility]/sister [phone]

## 2016-01-06 NOTE — ED Provider Notes (Signed)
CSN: [id number]     Arrival date & time 01/06/16  4401 History   First MD Initiated Contact with Patient 01/06/16 [id number]     Chief Complaint  Patient presents with  . Altered Mental Status     (Consider location/radiation/quality/duration/timing/severity/associated sxs/prior Treatment) HPI Comments: 55yo M w/ extensive PMH including alcohol abuse, HTN, COPD, T2DM, seizures who p/w AMS and possible seizure. History limited because of patient's altered mental status and obtained primarily from EMS. They report that they were called to the patient's house for possible seizure activity. The family reported him taking 3 shots of liquor and drinking 3 40-oz beers. On arrival he was altered but no seizure activity noted and patient complaining of left hip pain. Patient is unsure whether he fell.  LEVEL 5 CAVEAT 2/2 AMS  Patient is a 55 y.o. male presenting with altered mental status. The history is provided by the EMS personnel.  Altered Mental Status   Past Medical History  Diagnosis Date  . Hypertension   . Gout   . Angina pectoris   . COPD (chronic obstructive pulmonary disease) (HCC)     on home O2 2L  . Asthma   . Exertional shortness of breath   . Arthritis   . Chronic lower back pain   . Diabetes mellitus without complication (HCC)   . Seizures (HCC)   . Alcohol abuse    Past Surgical History  Procedure Laterality Date  . No past surgeries     Family History  Problem Relation Age of Onset  . Diabetes type II Mother   . Depression Father   . Suicidality Father   . Asthma Brother    Social History  Substance Use Topics  . Smoking status: Current Every Day Smoker -- 0.50 packs/day for 40 years    Types: Cigarettes  . Smokeless tobacco: Never Used     Comment: 05/12/2013 'eased off smoking since last month"  . Alcohol Use: 0.0 oz/week    Review of Systems  Unable to perform ROS: Mental status change      Allergies  Review of patient's allergies indicates no known  allergies.  Home Medications   Prior to Admission medications   Medication Sig Start Date End Date Taking? Authorizing Provider  albuterol (PROVENTIL HFA;VENTOLIN HFA) 108 (90 BASE) MCG/ACT inhaler Inhale 1 puff into the lungs every 6 (six) hours as needed for wheezing or shortness of breath.   Yes Historical Provider, MD  allopurinol (ZYLOPRIM) 100 MG tablet Take 100 mg by mouth daily.   Yes Historical Provider, MD  ibuprofen (ADVIL,MOTRIN) 800 MG tablet Take 800 mg by mouth 3 (three) times daily as needed for headache or moderate pain.   Yes Historical Provider, MD  metFORMIN (GLUCOPHAGE) 1000 MG tablet Take 1,000 mg by mouth 2 (two) times daily with a meal.   Yes Historical Provider, MD  omeprazole (PRILOSEC) 20 MG capsule Take 20 mg by mouth daily.   Yes Historical Provider, MD  pravastatin (PRAVACHOL) 40 MG tablet Take 40 mg by mouth every evening.   Yes Historical Provider, MD  predniSONE (DELTASONE) 2.5 MG tablet Take 2.5 mg by mouth daily with breakfast.   Yes Historical Provider, MD  amLODipine (NORVASC) 5 MG tablet Take 5 mg by mouth daily. Reported on 01/06/2016    Historical Provider, MD  atenolol (TENORMIN) 100 MG tablet Take 100 mg by mouth every morning. Reported on 01/06/2016    Historical Provider, MD  losartan (COZAAR) 100 MG tablet Take 100 mg  by mouth every morning. Reported on 01/06/2016    Historical Provider, MD   BP 156/95 mmHg  Pulse 64  Temp(Src) 97.4 F (36.3 C) (Oral)  Resp 21  SpO2 99% Physical Exam  Constitutional: He appears well-developed and well-nourished.  Sleepy, altered  HENT:  Head: Normocephalic and atraumatic.  Mildly dry mucous membranes  Eyes: Conjunctivae are normal. Pupils are equal, round, and reactive to light.  Pinpoint pupils  Neck: Neck supple.  Cardiovascular: Normal rate, regular rhythm and normal heart sounds.   No murmur heard. Pulmonary/Chest: Effort normal.  Coarse BS b/l, occasional expiratory wheezes  Abdominal: Soft. Bowel sounds  are normal. He exhibits no distension. There is no tenderness.  Musculoskeletal: He exhibits no edema.  Neurological:  Sleepy, confused w/ slurred speech, moving all 4 extremities  Skin: Skin is warm and dry.  Nursing note and vitals reviewed.   ED Course  Procedures (including critical care time) Labs Review Labs Reviewed  COMPREHENSIVE METABOLIC PANEL - Abnormal; Notable for the following:    CO2 19 (*)    BUN 21 (*)    All other components within normal limits  ETHANOL - Abnormal; Notable for the following:    Alcohol, Ethyl (B) 194 (*)    All other components within normal limits  ACETAMINOPHEN LEVEL - Abnormal; Notable for the following:    Acetaminophen (Tylenol), Serum <10 (*)    All other components within normal limits  I-STAT CG4 LACTIC ACID, ED - Abnormal; Notable for the following:    Lactic Acid, Venous 3.28 (*)    All other components within normal limits  I-STAT CG4 LACTIC ACID, ED - Abnormal; Notable for the following:    Lactic Acid, Venous 3.33 (*)    All other components within normal limits  I-STAT CG4 LACTIC ACID, ED - Abnormal; Notable for the following:    Lactic Acid, Venous 3.10 (*)    All other components within normal limits  SALICYLATE LEVEL  CBC WITH DIFFERENTIAL/PLATELET  URINE RAPID DRUG SCREEN, HOSP PERFORMED  URINALYSIS, ROUTINE W REFLEX MICROSCOPIC (NOT AT [facility])    Imaging Review Dg Chest 2 View  01/06/2016  CLINICAL DATA:  Altered mental status EXAM: CHEST  2 VIEW COMPARISON:  01/04/2015 FINDINGS: Cardiac shadow is mildly enlarged. The lungs are poorly aerated although no focal infiltrate is seen. No sizable effusion is noted. No bony abnormality is seen. IMPRESSION: Poor inspiratory effort.  No acute abnormality noted. Electronically Signed   By: Alcide Clever M.D.   On: 01/06/2016 11:24   Ct Head Wo Contrast  01/06/2016  CLINICAL DATA:  Seizure, altered mental status, intake of three 40 ounce beers and 3 shots of liquor, history  hypertension, COPD, asthma, diabetes mellitus EXAM: CT HEAD WITHOUT CONTRAST TECHNIQUE: Contiguous axial images were obtained from the base of the skull through the vertex without intravenous contrast. COMPARISON:  01/03/2015 FINDINGS: Mild atrophy. Normal ventricular morphology. No midline shift or mass effect. Minimal small vessel chronic ischemic changes of deep cerebral white matter. No intracranial hemorrhage, mass lesion or evidence acute infarction. No extra-axial fluid collections. Atherosclerotic calcifications at the carotid siphons. Sinuses clear and bones unremarkable. IMPRESSION: No acute intracranial abnormalities. Electronically Signed   By: Ulyses Southward M.D.   On: 01/06/2016 11:51   Dg Hip Unilat With Pelvis 2-3 Views Left  01/06/2016  CLINICAL DATA:  Left posterior chronic hip pain EXAM: DG HIP (WITH OR WITHOUT PELVIS) 2-3V LEFT COMPARISON:  None. FINDINGS: There is no evidence of hip fracture or  dislocation. There is no evidence of arthropathy or other focal bone abnormality. Pelvic iliac and femoral atherosclerosis noted. IMPRESSION: No acute finding by plain radiography. Iliofemoral atherosclerosis Electronically Signed   By: Judie Petit.  Shick M.D.   On: 01/06/2016 08:40   I have personally reviewed and evaluated these lab results as part of my medical decision-making.   EKG Interpretation None     Medications  sodium chloride 0.9 % bolus 1,000 mL (0 mLs Intravenous Stopped 01/06/16 0841)  sodium chloride 0.9 % bolus 1,000 mL (0 mLs Intravenous Stopped 01/06/16 1037)  sodium chloride 0.9 % bolus 1,000 mL (0 mLs Intravenous Stopped 01/06/16 1214)  sodium chloride 0.9 % bolus 1,000 mL (0 mLs Intravenous Stopped 01/06/16 1319)    MDM   Final diagnoses:  Lactic acidosis  Altered mental status, unspecified altered mental status type  Alcohol abuse   Pt w/ h/o alcohol abuse who p/w AMS in setting of alcohol use and family reporting concerns for seizure activity. Pt intoxicated on arrival, stable  VS, NAD. Moving all 4 ext, laying on R hip complaining of L hip pain but no obvious deformity noted. Pt unable to comply with neuro exam. Obtained above labs and hip XR and gave pt IVF bolus.  Initial lab work notable only for BAL 194, lactate 3.28. Gave the patient 2 L of IV fluids and repeat lactate was unimproved at 3.3. Patient remains afebrile. Added UA, CXR to evaluate for infection which were unremarkable. Head CT negative for acute process. Gave the patient 2 more liters of fluid and his lactate remains elevated at 3.1. I am concerned about his persistent lactic acidosis without clear etiology. He does take metformin. The patient's ental status has improved although he does continue to slur his speech and is a poor historian. I have discussed admission for further workup of his lactic acidosis with Dr. Susie Cassette. Pt admitted for further care.  Laurence Spates, MD 01/06/16 [phone]

## 2016-01-06 NOTE — Progress Notes (Signed)
Received report from [employer]. Pt arrived unit from ED, alert and oriented, able to communicate needs, MD notified of Pt's location. Will continue with current plan of care.

## 2016-01-06 NOTE — ED Notes (Signed)
Patient placed on O2 2L/min via [place]. Sats dropped to 88% when sleeping.Sats 95% with O2 on.

## 2016-01-07 DIAGNOSIS — I10 Essential (primary) hypertension: Secondary | ICD-10-CM

## 2016-01-07 DIAGNOSIS — E872 Acidosis: Principal | ICD-10-CM

## 2016-01-07 DIAGNOSIS — J449 Chronic obstructive pulmonary disease, unspecified: Secondary | ICD-10-CM

## 2016-01-07 DIAGNOSIS — F10129 Alcohol abuse with intoxication, unspecified: Secondary | ICD-10-CM

## 2016-01-07 LAB — GLUCOSE, CAPILLARY
GLUCOSE-CAPILLARY: 101 mg/dL — AB (ref 65–99)
GLUCOSE-CAPILLARY: 100 mg/dL — AB (ref 65–99)
Glucose-Capillary: 101 mg/dL — ABNORMAL HIGH (ref 65–99)
Glucose-Capillary: 144 mg/dL — ABNORMAL HIGH (ref 65–99)

## 2016-01-07 LAB — PHOSPHORUS: PHOSPHORUS: 3.2 mg/dL (ref 2.5–4.6)

## 2016-01-07 LAB — MAGNESIUM: MAGNESIUM: 2 mg/dL (ref 1.7–2.4)

## 2016-01-07 MED ORDER — HYDRALAZINE HCL 20 MG/ML IJ SOLN: 5.0000 mg | Freq: Four times a day (QID) | INTRAMUSCULAR | Status: DC | PRN

## 2016-01-07 NOTE — Progress Notes (Signed)
Patient ID: ANTHONE PRIEUR, male   DOB: 1961-01-02, 55 y.o.   MRN: [id number] [facility] PROGRESS NOTE  ELDWIN VOLKOV WUJ:[id number] DOB: 1960/12/18 DOA: 01/06/2016 PCP: Augustine Radar, MD  Brief narrative:    55 y.o. male with a past medical history of seizure disorder, alcohol abuse, hypertension, medication noncompliance, tobacco abuse, COPD who presented to [facility] ED with seizures at home.  In the ED, workup was significant for lactic acidosis. Patient was initially postictal and intoxicated. Alcohol level was 194.   Assessment/Plan:    Principal Problem: Acute alcohol intoxication / Lactic acidosis - Alcohol level is 194 on admission - CIWA protocol initiated on admission - Continue withdrawal and seizure precautions - Continue IV fluids - Continue multivitamin, thiamine and folic acid  Active Problems: Essential hypertension  - Continue atenolol and losartan - Added PRN hydralazine for BP above 150/90  Chronic obstructive pulmonary disease (COPD) (HCC) - Stable respiratory status   Gout - Continue allopurinol   DVT Prophylaxis  - Lovenox subQ  Code Status: Full.  Family Communication:  plan of care discussed with the patient Disposition Plan: Home by 01/09/2016  IV access:  Peripheral IV  Procedures and diagnostic studies:    Dg Chest 2 View 01/06/2016  Poor inspiratory effort.  No acute abnormality noted.  Ct Head Wo Contrast 01/06/2016 No acute intracranial abnormalities.   Dg Hip Unilat With Pelvis 2-3 Views Left 01/06/2016  No acute finding by plain radiography. Iliofemoral atherosclerosis   Medical Consultants:  None   Other Consultants:  None   IAnti-Infectives:   None    Manson Passey, MD  [facility] Pager [phone]  Time spent in minutes: 25 minutes  If 7PM-7AM, please contact night-coverage [email or web address] Password [email or web address] 01/07/2016, 12:12 PM   LOS: 1 day    HPI/Subjective: No acute overnight events. Patient reports feeling  better.  Objective: Filed Vitals:   01/06/16 1500 01/06/16 1609 01/06/16 2158 01/07/16 0545  BP: 145/96 163/95 179/94   Pulse: 80 76    Temp:  98.7 F (37.1 C) 97.9 F (36.6 C) 97.9 F (36.6 C)  TempSrc:  Oral Oral Oral  Resp: Height:   (1.651 m)    Weight:  189 lb 8 oz (85.957 kg)    SpO2: 97% 98% 100% 100%    Intake/Output Summary (Last 24 hours) at 01/07/16 1212 Last data filed at 01/07/16 0800  Gross per 24 hour  Intake   3740 ml  Output   1100 ml  Net   2640 ml    Exam:   General:  Pt is alert, not in acute distress  Cardiovascular: RRR, appreciate S1, S2  Respiratory: No wheezing, no crackles, no rhonchi  Abdomen: (+) BS, non tender   Extremities: No swelling, palpable pulses   Neuro: Nonfocal  Data Reviewed: Basic Metabolic Panel:  Recent Labs Lab 01/06/16 0802 01/07/16 0604  NA 137  --   K 3.7  --   CL 103  --   CO2 19*  --   GLUCOSE 97  --   BUN 21*  --   CREATININE 1.18  --   CALCIUM 9.3  --   MG  --  2.0  PHOS  --  3.2   Liver Function Tests:  Recent Labs Lab 01/06/16 0802  AST 33  ALT 21  ALKPHOS 46  BILITOT 0.9  PROT 7.9  ALBUMIN 4.0   No results for input(s): LIPASE, AMYLASE in the last 168 hours.  No results for input(s): AMMONIA in the last 168 hours. CBC:  Recent Labs Lab 01/06/16 0802  WBC 5.1  NEUTROABS 3.3  HGB 13.6  HCT 39.9  MCV 91.7  PLT 295   Cardiac Enzymes: No results for input(s): CKTOTAL, CKMB, CKMBINDEX, TROPONINI in the last 168 hours. BNP: Invalid input(s): POCBNP CBG:  Recent Labs Lab 01/06/16 2155 01/07/16 0802  GLUCAP 73 101*    No results found for this or any previous visit (from the past 240 hour(s)).   Scheduled Meds: . allopurinol  100 mg Oral Daily  . atenolol  100 mg Oral q morning - 10a  . enoxaparin (LOVENOX) injection  40 mg Subcutaneous Q24H  . folic acid  1 mg Oral Daily  . losartan  100 mg Oral q morning - 10a  . multivitamin with minerals  1 tablet  Oral Daily  . pantoprazole  40 mg Oral Daily  . sodium chloride flush  3 mL Intravenous Q12H  . thiamine  100 mg Oral Daily   Or  . thiamine  100 mg Intravenous Daily   Continuous Infusions: . 0.9 % NaCl with KCl 20 mEq / L 125 mL/hr at 01/06/16 1610

## 2016-01-08 LAB — GLUCOSE, CAPILLARY: GLUCOSE-CAPILLARY: 115 mg/dL — AB (ref 65–99)

## 2016-01-08 MED ORDER — METFORMIN HCL 1000 MG PO TABS: 1000.0000 mg | ORAL_TABLET | Freq: Two times a day (BID) | ORAL | Status: DC

## 2016-01-08 MED ORDER — THIAMINE HCL 100 MG PO TABS
100.0000 mg | ORAL_TABLET | Freq: Every day | ORAL | Status: DC
Start: 2016-01-08 — End: 2018-09-05

## 2016-01-08 MED ORDER — ATENOLOL 100 MG PO TABS: 100.0000 mg | ORAL_TABLET | Freq: Every morning | ORAL | Status: DC

## 2016-01-08 MED ORDER — FOLIC ACID 1 MG PO TABS: 1.0000 mg | ORAL_TABLET | Freq: Every day | ORAL | Status: DC

## 2016-01-08 MED ORDER — LOSARTAN POTASSIUM 100 MG PO TABS: 100.0000 mg | ORAL_TABLET | Freq: Every morning | ORAL | Status: DC

## 2016-01-08 NOTE — Discharge Instructions (Signed)

## 2016-01-08 NOTE — Progress Notes (Signed)
Utilization Review Completed.Latravis Grine T2/04/2016  

## 2016-01-08 NOTE — Discharge Summary (Addendum)
Physician Discharge Summary  Darryl Hurley ZOX:[id number] DOB: 1961/06/12 DOA: 01/06/2016  PCP: Augustine Radar, MD  Admit date: 01/06/2016 Discharge date: 01/08/2016  Recommendations for Outpatient Follow-up:  1. Check CBC and BMP during next visit with PCP 2. Continue losartan and atenolol for blood pressure control   Discharge Diagnoses:  Principal Problem:   Lactic acid acidosis Active Problems:   TOBACCO ABUSE   Uncontrolled hypertension   Chronic obstructive pulmonary disease (COPD) (HCC)   Gout   Type 2 diabetes mellitus (HCC)   Alcohol abuse with intoxication (HCC)    Discharge Condition: stable   Diet recommendation: as tolerated   History of present illness:  55 y.o. male with a past medical history of seizure disorder, alcohol abuse, hypertension, medication noncompliance, tobacco abuse, COPD who presented to [facility] ED with seizures at home.  In the ED, workup was significant for lactic acidosis. Patient was initially postictal and intoxicated. Alcohol level was 194.  Hospital Course:   Assessment/Plan:    Principal Problem: Acute alcohol intoxication / Lactic acidosis due to alcohol intoxication - Alcohol level is 194 on admission - CIWA protocol initiated on admission - No withdrawals since admission - Continue multivitamin, thiamine and folic acid on discharge   Active Problems: Essential hypertension  - Continue atenolol and losartan  Chronic obstructive pulmonary disease (COPD) (HCC) - Stable   Gout - Continue allopurinol   DVT Prophylaxis  - Lovenox subQ in hospital   Code Status: Full.  Family Communication: plan of care discussed with the patient   IV access:  Peripheral IV  Procedures and diagnostic studies:   Dg Chest 2 View 01/06/2016 Poor inspiratory effort. No acute abnormality noted.  Ct Head Wo Contrast 01/06/2016 No acute intracranial abnormalities.   Dg Hip Unilat With Pelvis 2-3 Views Left 01/06/2016 No acute  finding by plain radiography. Iliofemoral atherosclerosis   Medical Consultants:  None   Other Consultants:  None   IAnti-Infectives:   None   Signed:  Manson Passey, MD  [facility] 01/08/2016, 10:31 AM  Pager #: [phone]  Time spent in minutes: less than 30 minutes    Discharge Exam: Filed Vitals:   01/07/16 2300 01/08/16 0500  BP: 170/108 156/95  Pulse: 59 67  Temp: 97.6 F (36.4 C) 98.2 F (36.8 C)  Resp: 20    Filed Vitals:   01/07/16 0545 01/07/16 1357 01/07/16 2300 01/08/16 0500  BP:  140/80 170/108 156/95  Pulse:  62 59 67  Temp: 97.9 F (36.6 C) 97.5 F (36.4 C) 97.6 F (36.4 C) 98.2 F (36.8 C)  TempSrc: Oral Oral Oral Oral  Resp: 18 18 20    Height:      Weight:      SpO2: 100% 97% 98% 95%    General: Pt is alert, follows commands appropriately, not in acute distress Cardiovascular: Regular rate and rhythm, S1/S2 +, no murmurs Respiratory: Clear to auscultation bilaterally, no wheezing, no crackles, no rhonchi Abdominal: Soft, non tender, non distended, bowel sounds +, no guarding Extremities: no edema, no cyanosis, pulses palpable bilaterally DP and PT Neuro: Grossly nonfocal  Discharge Instructions  Discharge Instructions    Call MD for:  difficulty breathing, headache or visual disturbances    Complete by:  As directed      Call MD for:  persistant dizziness or light-headedness    Complete by:  As directed      Call MD for:  persistant nausea and vomiting    Complete by:  As directed  Call MD for:  severe uncontrolled pain    Complete by:  As directed      Diet - low sodium heart healthy    Complete by:  As directed      Discharge instructions    Complete by:  As directed   Continue atenolol and losartan for blood pressure control     Increase activity slowly    Complete by:  As directed             Medication List    STOP taking these medications        ibuprofen 800 MG tablet  Commonly known as:   ADVIL,MOTRIN      TAKE these medications        albuterol 108 (90 Base) MCG/ACT inhaler  Commonly known as:  PROVENTIL HFA;VENTOLIN HFA  Inhale 1 puff into the lungs every 6 (six) hours as needed for wheezing or shortness of breath.     allopurinol 100 MG tablet  Commonly known as:  ZYLOPRIM  Take 100 mg by mouth daily.     atenolol 100 MG tablet  Commonly known as:  TENORMIN  Take 1 tablet (100 mg total) by mouth every morning. Reported on 01/06/2016     folic acid 1 MG tablet  Commonly known as:  FOLVITE  Take 1 tablet (1 mg total) by mouth daily.     losartan 100 MG tablet  Commonly known as:  COZAAR  Take 1 tablet (100 mg total) by mouth every morning. Reported on 01/06/2016     metFORMIN 1000 MG tablet  Commonly known as:  GLUCOPHAGE  Take 1 tablet (1,000 mg total) by mouth 2 (two) times daily with a meal.     omeprazole 20 MG capsule  Commonly known as:  PRILOSEC  Take 20 mg by mouth daily.     thiamine 100 MG tablet  Take 1 tablet (100 mg total) by mouth daily.           Follow-up Information    Follow up with Augustine Radar, MD. Schedule an appointment as soon as possible for a visit in 1 week.   Specialty:  Nurse Practitioner   Why:  Follow up appt after recent hospitalization   Contact information:   [address] [place] [address] [phone]        The results of significant diagnostics from this hospitalization (including imaging, microbiology, ancillary and laboratory) are listed below for reference.    Significant Diagnostic Studies: Dg Chest 2 View  01/06/2016  CLINICAL DATA:  Altered mental status EXAM: CHEST  2 VIEW COMPARISON:  01/04/2015 FINDINGS: Cardiac shadow is mildly enlarged. The lungs are poorly aerated although no focal infiltrate is seen. No sizable effusion is noted. No bony abnormality is seen. IMPRESSION: Poor inspiratory effort.  No acute abnormality noted. Electronically Signed   By: Alcide Clever M.D.   On: 01/06/2016  11:24   Ct Head Wo Contrast  01/06/2016  CLINICAL DATA:  Seizure, altered mental status, intake of three 40 ounce beers and 3 shots of liquor, history hypertension, COPD, asthma, diabetes mellitus EXAM: CT HEAD WITHOUT CONTRAST TECHNIQUE: Contiguous axial images were obtained from the base of the skull through the vertex without intravenous contrast. COMPARISON:  01/03/2015 FINDINGS: Mild atrophy. Normal ventricular morphology. No midline shift or mass effect. Minimal small vessel chronic ischemic changes of deep cerebral white matter. No intracranial hemorrhage, mass lesion or evidence acute infarction. No extra-axial fluid collections. Atherosclerotic calcifications at the carotid  siphons. Sinuses clear and bones unremarkable. IMPRESSION: No acute intracranial abnormalities. Electronically Signed   By: Ulyses Southward M.D.   On: 01/06/2016 11:51   Dg Hip Unilat With Pelvis 2-3 Views Left  01/06/2016  CLINICAL DATA:  Left posterior chronic hip pain EXAM: DG HIP (WITH OR WITHOUT PELVIS) 2-3V LEFT COMPARISON:  None. FINDINGS: There is no evidence of hip fracture or dislocation. There is no evidence of arthropathy or other focal bone abnormality. Pelvic iliac and femoral atherosclerosis noted. IMPRESSION: No acute finding by plain radiography. Iliofemoral atherosclerosis Electronically Signed   By: Judie Petit.  Shick M.D.   On: 01/06/2016 08:40    Microbiology: No results found for this or any previous visit (from the past 240 hour(s)).   Labs: Basic Metabolic Panel:  Recent Labs Lab 01/06/16 0802 01/07/16 0604  NA 137  --   K 3.7  --   CL 103  --   CO2 19*  --   GLUCOSE 97  --   BUN 21*  --   CREATININE 1.18  --   CALCIUM 9.3  --   MG  --  2.0  PHOS  --  3.2   Liver Function Tests:  Recent Labs Lab 01/06/16 0802  AST 33  ALT 21  ALKPHOS 46  BILITOT 0.9  PROT 7.9  ALBUMIN 4.0   No results for input(s): LIPASE, AMYLASE in the last 168 hours. No results for input(s): AMMONIA in the last 168  hours. CBC:  Recent Labs Lab 01/06/16 0802  WBC 5.1  NEUTROABS 3.3  HGB 13.6  HCT 39.9  MCV 91.7  PLT 295   Cardiac Enzymes: No results for input(s): CKTOTAL, CKMB, CKMBINDEX, TROPONINI in the last 168 hours. BNP: BNP (last 3 results) No results for input(s): BNP in the last 8760 hours.  ProBNP (last 3 results) No results for input(s): PROBNP in the last 8760 hours.  CBG:  Recent Labs Lab 01/07/16 0802 01/07/16 1205 01/07/16 1603 01/07/16 2215 01/08/16 0717  GLUCAP 101* 144* 101* 100* 115*

## 2016-11-08 ENCOUNTER — Encounter (HOSPITAL_COMMUNITY): Payer: Self-pay | Admitting: Emergency Medicine

## 2016-11-08 ENCOUNTER — Emergency Department (HOSPITAL_COMMUNITY)
Admission: EM | Admit: 2016-11-08 | Discharge: 2016-11-09 | Disposition: A | Discharge status: Home / Self Care | Payer: Medicaid Other | Attending: Emergency Medicine | Admitting: Emergency Medicine

## 2016-11-08 DIAGNOSIS — F10129 Alcohol abuse with intoxication, unspecified: Secondary | ICD-10-CM | POA: Insufficient documentation

## 2016-11-08 DIAGNOSIS — K4090 Unilateral inguinal hernia, without obstruction or gangrene, not specified as recurrent: Secondary | ICD-10-CM | POA: Diagnosis not present

## 2016-11-08 DIAGNOSIS — J449 Chronic obstructive pulmonary disease, unspecified: Secondary | ICD-10-CM | POA: Insufficient documentation

## 2016-11-08 DIAGNOSIS — E119 Type 2 diabetes mellitus without complications: Secondary | ICD-10-CM | POA: Insufficient documentation

## 2016-11-08 DIAGNOSIS — F17210 Nicotine dependence, cigarettes, uncomplicated: Secondary | ICD-10-CM | POA: Diagnosis not present

## 2016-11-08 DIAGNOSIS — I10 Essential (primary) hypertension: Secondary | ICD-10-CM | POA: Diagnosis not present

## 2016-11-08 DIAGNOSIS — Z7984 Long term (current) use of oral hypoglycemic drugs: Secondary | ICD-10-CM | POA: Insufficient documentation

## 2016-11-08 DIAGNOSIS — F10920 Alcohol use, unspecified with intoxication, uncomplicated: Secondary | ICD-10-CM

## 2016-11-08 LAB — COMPREHENSIVE METABOLIC PANEL
ALK PHOS: 56 U/L (ref 38–126)
ALT: 64 U/L — ABNORMAL HIGH (ref 17–63)
ANION GAP: 12 (ref 5–15)
AST: 101 U/L — ABNORMAL HIGH (ref 15–41)
Albumin: 3.8 g/dL (ref 3.5–5.0)
BILIRUBIN TOTAL: 0.7 mg/dL (ref 0.3–1.2)
BUN: 10 mg/dL (ref 6–20)
CALCIUM: 9.8 mg/dL (ref 8.9–10.3)
CO2: 21 mmol/L — ABNORMAL LOW (ref 22–32)
Chloride: 102 mmol/L (ref 101–111)
Creatinine, Ser: 1.06 mg/dL (ref 0.61–1.24)
GFR calc non Af Amer: >60 mL/min (ref >60)
Glucose, Bld: 109 mg/dL — ABNORMAL HIGH (ref 65–99)
POTASSIUM: 3.3 mmol/L — AB (ref 3.5–5.1)
SODIUM: 135 mmol/L (ref 135–145)
TOTAL PROTEIN: 7.4 g/dL (ref 6.5–8.1)

## 2016-11-08 LAB — CBC WITH DIFFERENTIAL/PLATELET
Basophils Absolute: 0 10*3/uL (ref 0.0–0.1)
Basophils Relative: 1 %
EOS ABS: 0.1 10*3/uL (ref 0.0–0.7)
Eosinophils Relative: 2 %
HEMATOCRIT: 39.7 % (ref 39.0–52.0)
HEMOGLOBIN: 13.4 g/dL (ref 13.0–17.0)
LYMPHS ABS: 2.2 10*3/uL (ref 0.7–4.0)
Lymphocytes Relative: 25 %
MCH: 31.2 pg (ref 26.0–34.0)
MCHC: 33.8 g/dL (ref 30.0–36.0)
MCV: 92.3 fL (ref 78.0–100.0)
MONO ABS: 0.6 10*3/uL (ref 0.1–1.0)
MONOS PCT: 7 %
NEUTROS PCT: 67 %
Neutro Abs: 5.9 10*3/uL (ref 1.7–7.7)
Platelets: 275 10*3/uL (ref 150–400)
RBC: 4.3 MIL/uL (ref 4.22–5.81)
RDW: 15.6 % — AB (ref 11.5–15.5)
WBC: 8.8 10*3/uL (ref 4.0–10.5)

## 2016-11-08 LAB — ETHANOL: ALCOHOL ETHYL (B): 264 mg/dL — AB (ref <5)

## 2016-11-08 MED ORDER — SODIUM CHLORIDE 0.9 % IV BOLUS (SEPSIS)
1000.0000 mL | Freq: Once | INTRAVENOUS | Status: AC
  Administered 2016-11-08: 1000 mL via INTRAVENOUS

## 2016-11-08 MED ORDER — PANTOPRAZOLE SODIUM 40 MG IV SOLR
40.0000 mg | Freq: Once | INTRAVENOUS | Status: DC
Start: 2016-11-08 — End: 2016-11-08

## 2016-11-08 NOTE — ED Triage Notes (Signed)
Pt BIB EMS from home. Unknown amount ETOH, but admits usage. Pt presents with slurred speech, uncoordinated movements, randomly aggressive attitude A&O x2 to person & location. Abdominal pain with unknown history. Pt very poor historian. Per EMS, prior to calling 911, family arrived at pt's home to find him standing over a chair, gripping it tightly. Family reportedly had to pry his grip away from the chair, and he promptly collapsed.

## 2016-11-08 NOTE — ED Provider Notes (Signed)
[facility]-EMERGENCY DEPT Provider Note   CSN: [id number] Arrival date & time: 11/08/16  2214     History   Chief Complaint Chief Complaint  Patient presents with  . Alcohol Intoxication  . Abdominal Pain    HPI Darryl Hurley is a [age] y.o. male.  Patient complains of severe pain in the left groin for the past month. History is unreliable due to patient's significant alcohol intoxication. He reports pain and repeats "I want my brother".    The history is provided by the patient.  Alcohol Intoxication  Associated symptoms include abdominal pain.  Abdominal Pain      Past Medical History:  Diagnosis Date  . Alcohol abuse   . Angina pectoris   . Arthritis   . Asthma   . Chronic lower back pain   . COPD (chronic obstructive pulmonary disease) (HCC)    on home O2 2L  . Diabetes mellitus without complication (HCC)   . Exertional shortness of breath   . Gout   . Hypertension   . Seizures [facility](HCC)     Patient Active Problem List   Diagnosis Date Noted  . Lactic acidosis 01/06/2016  . Lactic acid acidosis 01/06/2016  . Alcohol abuse with intoxication (HCC) 01/06/2016  . COPD with acute exacerbation (HCC) 02/02/2014  . HCAP (healthcare-associated pneumonia) 02/02/2014  . Increased anion gap metabolic acidosis 02/02/2014  . Cocaine abuse 11/18/2013  . Fever 11/17/2013  . Anemia 11/17/2013  . Hypokalemia 11/17/2013  . Polyarthritis 11/17/2013  . Dehydration 11/17/2013  . Type 2 diabetes mellitus (HCC) 05/14/2013  . Chronic obstructive pulmonary disease (COPD) (HCC) 05/12/2013  . Gout 05/12/2013  . Headache 05/12/2013  . Asthma 05/04/2013  . TOBACCO ABUSE 04/27/2009  . Uncontrolled hypertension 04/27/2009    Past Surgical History:  Procedure Laterality Date  . NO PAST SURGERIES         Home Medications    Prior to Admission medications   Medication Sig Start Date End Date Taking? Authorizing Provider  albuterol (PROVENTIL HFA;VENTOLIN HFA) 108 (90 BASE)  MCG/ACT inhaler Inhale 1 puff into the lungs every 6 (six) hours as needed for wheezing or shortness of breath.    Historical Provider, MD  allopurinol (ZYLOPRIM) 100 MG tablet Take 100 mg by mouth daily.    Historical Provider, MD  atenolol (TENORMIN) 100 MG tablet Take 1 tablet (100 mg total) by mouth every morning. Reported on 01/06/2016 01/08/16   Alison MurrayAlma M Devine, MD  folic acid (FOLVITE) 1 MG tablet Take 1 tablet (1 mg total) by mouth daily. 01/08/16   Alison MurrayAlma M Devine, MD  losartan (COZAAR) 100 MG tablet Take 1 tablet (100 mg total) by mouth every morning. Reported on 01/06/2016 01/08/16   Alison MurrayAlma M Devine, MD  metFORMIN (GLUCOPHAGE) 1000 MG tablet Take 1 tablet (1,000 mg total) by mouth 2 (two) times daily with a meal. 01/08/16   Alison MurrayAlma M Devine, MD  omeprazole (PRILOSEC) 20 MG capsule Take 20 mg by mouth daily.    Historical Provider, MD  thiamine 100 MG tablet Take 1 tablet (100 mg total) by mouth daily. 01/08/16   Alison MurrayAlma M Devine, MD    Family History Family History  Problem Relation Age of Onset  . Diabetes type II Mother   . Depression Father   . Suicidality Father   . Asthma Brother     Social History Social History  Substance Use Topics  . Smoking status: Current Every Day Smoker    Packs/day: 0.50  Years: 40.00    Types: Cigarettes  . Smokeless tobacco: Never Used     Comment: 05/12/2013 'eased off smoking since last month"  . Alcohol use 0.0 oz/week     Allergies   Patient has no known allergies.   Review of Systems Review of Systems  Unable to perform ROS: Other (Alcohol intoxication)  Gastrointestinal: Positive for abdominal pain.     Physical Exam Updated Vital Signs There were no vitals taken for this visit.  Physical Exam  Constitutional: He appears well-developed and well-nourished. No distress.  Acutely intoxicated.  Neck: Normal range of motion.  Cardiovascular: Normal rate.   No murmur heard. Pulmonary/Chest: Effort normal.  Abdominal: Soft.  Genitourinary:    Genitourinary Comments: Left inguinal hernia palpable. Soft, reducible. No testicular swelling.      ED Treatments / Results  Labs (all labs ordered are listed, but only abnormal results are displayed) Labs Reviewed  CBC WITH DIFFERENTIAL/PLATELET  COMPREHENSIVE METABOLIC PANEL  ETHANOL   Results for orders placed or performed during the hospital encounter of 11/08/16  CBC with Differential  Result Value Ref Range   WBC 8.8 4.0 - 10.5 K/uL   RBC 4.30 4.22 - 5.81 MIL/uL   Hemoglobin 13.4 13.0 - 17.0 g/dL   HCT 30.839.7 65.739.0 - 84.652.0 %   MCV 92.3 78.0 - 100.0 fL   MCH 31.2 26.0 - 34.0 pg   MCHC 33.8 30.0 - 36.0 g/dL   RDW 96.215.6 (H) 95.211.5 - 84.115.5 %   Platelets 275 150 - 400 K/uL   Neutrophils Relative % 67 %   Neutro Abs 5.9 1.7 - 7.7 K/uL   Lymphocytes Relative 25 %   Lymphs Abs 2.2 0.7 - 4.0 K/uL   Monocytes Relative 7 %   Monocytes Absolute 0.6 0.1 - 1.0 K/uL   Eosinophils Relative 2 %   Eosinophils Absolute 0.1 0.0 - 0.7 K/uL   Basophils Relative 1 %   Basophils Absolute 0.0 0.0 - 0.1 K/uL  Comprehensive metabolic panel  Result Value Ref Range   Sodium 135 135 - 145 mmol/L   Potassium 3.3 (L) 3.5 - 5.1 mmol/L   Chloride 102 101 - 111 mmol/L   CO2 21 (L) 22 - 32 mmol/L   Glucose, Bld 109 (H) 65 - 99 mg/dL   BUN 10 6 - 20 mg/dL   Creatinine, Ser 3.241.06 0.61 - 1.24 mg/dL   Calcium 9.8 8.9 - 40.110.3 mg/dL   Total Protein 7.4 6.5 - 8.1 g/dL   Albumin 3.8 3.5 - 5.0 g/dL   AST 027101 (H) 15 - 41 U/L   ALT 64 (H) 17 - 63 U/L   Alkaline Phosphatase 56 38 - 126 U/L   Total Bilirubin 0.7 0.3 - 1.2 mg/dL   GFR calc non Af Amer >60 >60 mL/min   GFR calc Af Amer >60 >60 mL/min   Anion gap 12 5 - 15  Ethanol  Result Value Ref Range   Alcohol, Ethyl (B) 264 (H) <5 mg/dL  Urinalysis, Routine w reflex microscopic  Result Value Ref Range   Color, Urine STRAW (A) YELLOW   APPearance CLEAR CLEAR   Specific Gravity, Urine 1.003 (L) 1.005 - 1.030   pH 5.0 5.0 - 8.0   Glucose, UA NEGATIVE  NEGATIVE mg/dL   Hgb urine dipstick NEGATIVE NEGATIVE   Bilirubin Urine NEGATIVE NEGATIVE   Ketones, ur NEGATIVE NEGATIVE mg/dL   Protein, ur 30 (A) NEGATIVE mg/dL   Nitrite NEGATIVE NEGATIVE   Leukocytes, UA NEGATIVE  NEGATIVE   RBC / HPF 0-5 0 - 5 RBC/hpf   WBC, UA 0-5 0 - 5 WBC/hpf   Bacteria, UA NONE SEEN NONE SEEN   Squamous Epithelial / LPF NONE SEEN NONE SEEN    No results found.  EKG  EKG Interpretation None       Radiology No results found.  Procedures Procedures (including critical care time)  Medications Ordered in ED Medications  pantoprazole (PROTONIX) injection 40 mg (not administered)  sodium chloride 0.9 % bolus 1,000 mL (1,000 mLs Intravenous New Bag/Given 11/08/16 2245)     Initial Impression / Assessment and Plan / ED Course  I have reviewed the triage vital signs and the nursing notes.  Pertinent labs & imaging results that were available during my care of the patient were reviewed by me and considered in my medical decision making (see chart for details).  Clinical Course    Patient presents with complaint of abdominal pain, left groin. There is a questionable inguinal hernia that is soft. Patient's exam is limited by his level of intoxication. Will check labs, provide IVF's and re-examine over time.   3:00 - patient sleeping, in NAD. VSS.  4:00 - patient sleeping, in NAD. VSS.  5:00 patient ambulated to bathroom. No complaints. On re-examination, the left inguinal hernia is still mildly tender. No testicular swelling. No problems urinating. Patient can be discharged home. Will refer to [facility] for elective hernia repair.   Final Clinical Impressions(s) / ED Diagnoses   Final diagnoses:  None   1. Alcohol intoxication 2. Left inguinal hernia   New Prescriptions New Prescriptions   No medications on file     Elpidio Anis, PA-C 11/09/16 4540    Tomasita Crumble, MD 11/09/16 1207

## 2016-11-09 LAB — URINALYSIS, ROUTINE W REFLEX MICROSCOPIC
BACTERIA UA: NONE SEEN
BILIRUBIN URINE: NEGATIVE
Glucose, UA: NEGATIVE mg/dL
HGB URINE DIPSTICK: NEGATIVE
Ketones, ur: NEGATIVE mg/dL
Leukocytes, UA: NEGATIVE
Nitrite: NEGATIVE
PROTEIN: 30 mg/dL — AB
Specific Gravity, Urine: 1.003 — ABNORMAL LOW (ref 1.005–1.030)
Squamous Epithelial / LPF: NONE SEEN
pH: 5 (ref 5.0–8.0)

## 2016-11-09 NOTE — ED Notes (Signed)
Pt ambulated to and from restroom with standby assist. Gait difficult d/t existing gout.

## 2016-11-09 NOTE — ED Notes (Signed)
Pt departed in NAD. Given bus pass.  

## 2017-01-19 ENCOUNTER — Emergency Department (HOSPITAL_COMMUNITY)
Admission: EM | Admit: 2017-01-19 | Discharge: 2017-01-19 | Disposition: A | Discharge status: Home / Self Care | Payer: Medicaid Other | Attending: Emergency Medicine | Admitting: Emergency Medicine

## 2017-01-19 ENCOUNTER — Encounter (HOSPITAL_COMMUNITY): Payer: Self-pay | Admitting: Emergency Medicine

## 2017-01-19 ENCOUNTER — Emergency Department (HOSPITAL_COMMUNITY): Payer: Medicaid Other

## 2017-01-19 DIAGNOSIS — E119 Type 2 diabetes mellitus without complications: Secondary | ICD-10-CM | POA: Diagnosis not present

## 2017-01-19 DIAGNOSIS — J449 Chronic obstructive pulmonary disease, unspecified: Secondary | ICD-10-CM | POA: Diagnosis not present

## 2017-01-19 DIAGNOSIS — I10 Essential (primary) hypertension: Secondary | ICD-10-CM | POA: Diagnosis not present

## 2017-01-19 DIAGNOSIS — Y929 Unspecified place or not applicable: Secondary | ICD-10-CM | POA: Insufficient documentation

## 2017-01-19 DIAGNOSIS — S0181XA Laceration without foreign body of other part of head, initial encounter: Secondary | ICD-10-CM | POA: Diagnosis not present

## 2017-01-19 DIAGNOSIS — Z23 Encounter for immunization: Secondary | ICD-10-CM | POA: Diagnosis not present

## 2017-01-19 DIAGNOSIS — Y939 Activity, unspecified: Secondary | ICD-10-CM | POA: Diagnosis not present

## 2017-01-19 DIAGNOSIS — F10920 Alcohol use, unspecified with intoxication, uncomplicated: Secondary | ICD-10-CM

## 2017-01-19 DIAGNOSIS — Y999 Unspecified external cause status: Secondary | ICD-10-CM | POA: Diagnosis not present

## 2017-01-19 DIAGNOSIS — S0990XA Unspecified injury of head, initial encounter: Secondary | ICD-10-CM

## 2017-01-19 DIAGNOSIS — Z79899 Other long term (current) drug therapy: Secondary | ICD-10-CM | POA: Insufficient documentation

## 2017-01-19 DIAGNOSIS — Z7984 Long term (current) use of oral hypoglycemic drugs: Secondary | ICD-10-CM | POA: Diagnosis not present

## 2017-01-19 DIAGNOSIS — F10120 Alcohol abuse with intoxication, uncomplicated: Secondary | ICD-10-CM | POA: Diagnosis not present

## 2017-01-19 DIAGNOSIS — F17210 Nicotine dependence, cigarettes, uncomplicated: Secondary | ICD-10-CM | POA: Diagnosis not present
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
## 2017-01-19 MED ORDER — LORAZEPAM 2 MG/ML IJ SOLN
1.0000 mg | Freq: Once | INTRAMUSCULAR | Status: AC
  Administered 2017-01-19: 1 mg via INTRAVENOUS
  Filled 2017-01-19: qty 1

## 2017-01-19 MED ORDER — TETANUS-DIPHTH-ACELL PERTUSSIS 5-2.5-18.5 LF-MCG/0.5 IM SUSP
0.5000 mL | Freq: Once | INTRAMUSCULAR | Status: AC
  Administered 2017-01-19: 0.5 mL via INTRAMUSCULAR
  Filled 2017-01-19: qty 0.5

## 2017-01-19 MED ORDER — SODIUM CHLORIDE 0.9 % IV BOLUS (SEPSIS)
1000.0000 mL | Freq: Once | INTRAVENOUS | Status: AC
  Administered 2017-01-19: 1000 mL via INTRAVENOUS

## 2017-01-19 MED ORDER — ACETAMINOPHEN 500 MG PO TABS: 1000.0000 mg | ORAL_TABLET | Freq: Once | ORAL | Status: DC

## 2017-01-19 MED ORDER — HALOPERIDOL LACTATE 5 MG/ML IJ SOLN
2.0000 mg | Freq: Once | INTRAMUSCULAR | Status: AC
  Administered 2017-01-19: 2 mg via INTRAVENOUS
  Filled 2017-01-19: qty 1

## 2017-01-19 MED ORDER — LIDOCAINE HCL (PF) 1 % IJ SOLN
5.0000 mL | Freq: Once | INTRAMUSCULAR | Status: AC
  Administered 2017-01-19: 5 mL
  Filled 2017-01-19: qty 5

## 2017-01-19 NOTE — ED Provider Notes (Signed)
LACERATION REPAIR Performed by: Bethel BornKelly Marie Gwenn Teodoro Authorized by: Bethel BornKelly Marie Prateek Knipple Consent: Verbal consent obtained. Risks and benefits: risks, benefits and alternatives were discussed Consent given by: patient Patient identity confirmed: provided demographic data Prepped and Draped in normal sterile fashion Wound explored  Laceration Location: forehead  Laceration Length: 3 cm  No Foreign Bodies seen or palpated  Anesthesia: local infiltration  Local anesthetic: lidocaine 1%  Anesthetic total: 5 ml  Irrigation method: syringe Amount of cleaning: standard  Skin closure: 6-0 Chromic gut   Number of sutures: 4  Technique: Simple interrupted  Patient tolerance: Patient tolerated the procedure well with no immediate complications.    Bethel BornKelly Marie Mishika Flippen, PA-C 01/19/17 [phone]
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
## 2017-01-19 NOTE — ED Notes (Signed)
Pt. Given food/beverage with EDP approval.
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
## 2017-01-19 NOTE — ED Notes (Signed)
Pt. Unable to ambulate at this time. Pt. Unsteady on his feet. Charge RN made aware pt. Cannot be d/c at this time.

## 2017-01-19 NOTE — ED Notes (Signed)
Pt. Ambulated with steady gait from bed to hallway. Pt. sts he is ready to go. Pt. To call his girlfriend for a ride home.

## 2017-01-19 NOTE — ED Notes (Signed)
Attempted to ambulate pt. Pt unable to stand at bedside. Pt placed back in bed and on monitor.

## 2017-05-15 ENCOUNTER — Encounter (HOSPITAL_COMMUNITY): Payer: Self-pay | Admitting: *Deleted

## 2017-05-15 ENCOUNTER — Emergency Department (HOSPITAL_COMMUNITY)
Admission: EM | Admit: 2017-05-15 | Discharge: 2017-05-15 | Disposition: A | Discharge status: Home / Self Care | Payer: Medicaid Other | Attending: Emergency Medicine | Admitting: Emergency Medicine

## 2017-05-15 ENCOUNTER — Emergency Department (HOSPITAL_COMMUNITY): Payer: Medicaid Other

## 2017-05-15 DIAGNOSIS — J449 Chronic obstructive pulmonary disease, unspecified: Secondary | ICD-10-CM | POA: Insufficient documentation

## 2017-05-15 DIAGNOSIS — J45909 Unspecified asthma, uncomplicated: Secondary | ICD-10-CM | POA: Insufficient documentation

## 2017-05-15 DIAGNOSIS — R079 Chest pain, unspecified: Secondary | ICD-10-CM | POA: Diagnosis not present

## 2017-05-15 DIAGNOSIS — Z7984 Long term (current) use of oral hypoglycemic drugs: Secondary | ICD-10-CM | POA: Insufficient documentation

## 2017-05-15 DIAGNOSIS — Z79899 Other long term (current) drug therapy: Secondary | ICD-10-CM | POA: Insufficient documentation

## 2017-05-15 DIAGNOSIS — F17210 Nicotine dependence, cigarettes, uncomplicated: Secondary | ICD-10-CM | POA: Diagnosis not present

## 2017-05-15 DIAGNOSIS — Z7902 Long term (current) use of antithrombotics/antiplatelets: Secondary | ICD-10-CM | POA: Diagnosis not present

## 2017-05-15 DIAGNOSIS — K4090 Unilateral inguinal hernia, without obstruction or gangrene, not specified as recurrent: Secondary | ICD-10-CM

## 2017-05-15 DIAGNOSIS — E119 Type 2 diabetes mellitus without complications: Secondary | ICD-10-CM | POA: Diagnosis not present

## 2017-05-15 DIAGNOSIS — R1031 Right lower quadrant pain: Secondary | ICD-10-CM | POA: Diagnosis present

## 2017-05-15 DIAGNOSIS — I10 Essential (primary) hypertension: Secondary | ICD-10-CM | POA: Insufficient documentation

## 2017-05-15 LAB — COMPREHENSIVE METABOLIC PANEL
ALK PHOS: 58 U/L (ref 38–126)
ALT: 37 U/L (ref 17–63)
ANION GAP: 9 (ref 5–15)
AST: 48 U/L — ABNORMAL HIGH (ref 15–41)
Albumin: 3.6 g/dL (ref 3.5–5.0)
BILIRUBIN TOTAL: 1.3 mg/dL — AB (ref 0.3–1.2)
BUN: 11 mg/dL (ref 6–20)
CALCIUM: 9.7 mg/dL (ref 8.9–10.3)
CO2: 26 mmol/L (ref 22–32)
CREATININE: 1.07 mg/dL (ref 0.61–1.24)
Chloride: 105 mmol/L (ref 101–111)
Glucose, Bld: 119 mg/dL — ABNORMAL HIGH (ref 65–99)
Potassium: 4.1 mmol/L (ref 3.5–5.1)
SODIUM: 140 mmol/L (ref 135–145)
TOTAL PROTEIN: 7.7 g/dL (ref 6.5–8.1)

## 2017-05-15 LAB — URINALYSIS, ROUTINE W REFLEX MICROSCOPIC
BILIRUBIN URINE: NEGATIVE
Bacteria, UA: NONE SEEN
GLUCOSE, UA: NEGATIVE mg/dL
HGB URINE DIPSTICK: NEGATIVE
Ketones, ur: NEGATIVE mg/dL
Leukocytes, UA: NEGATIVE
NITRITE: NEGATIVE
PH: 5 (ref 5.0–8.0)
Protein, ur: 100 mg/dL — AB
RBC / HPF: NONE SEEN RBC/hpf (ref 0–5)
SPECIFIC GRAVITY, URINE: 1.016 (ref 1.005–1.030)
Squamous Epithelial / LPF: NONE SEEN
WBC UA: NONE SEEN WBC/hpf (ref 0–5)

## 2017-05-15 LAB — I-STAT TROPONIN, ED: TROPONIN I, POC: 0 ng/mL (ref 0.00–0.08)

## 2017-05-15 LAB — CBC
HCT: 41.6 % (ref 39.0–52.0)
Hemoglobin: 13.7 g/dL (ref 13.0–17.0)
MCH: 30.9 pg (ref 26.0–34.0)
MCHC: 32.9 g/dL (ref 30.0–36.0)
MCV: 93.7 fL (ref 78.0–100.0)
PLATELETS: 273 10*3/uL (ref 150–400)
RBC: 4.44 MIL/uL (ref 4.22–5.81)
RDW: 15.6 % — AB (ref 11.5–15.5)
WBC: 7.6 10*3/uL (ref 4.0–10.5)

## 2017-05-15 IMAGING — DX DG CHEST 2V
2 series · 2 of 2 positions shown · non-contrast
Comparison: [DATE]

CLINICAL DATA: Right-sided chest pain and shortness of breath over
the last 2 weeks.

EXAM:
CHEST  2 VIEW

[w chest pa]
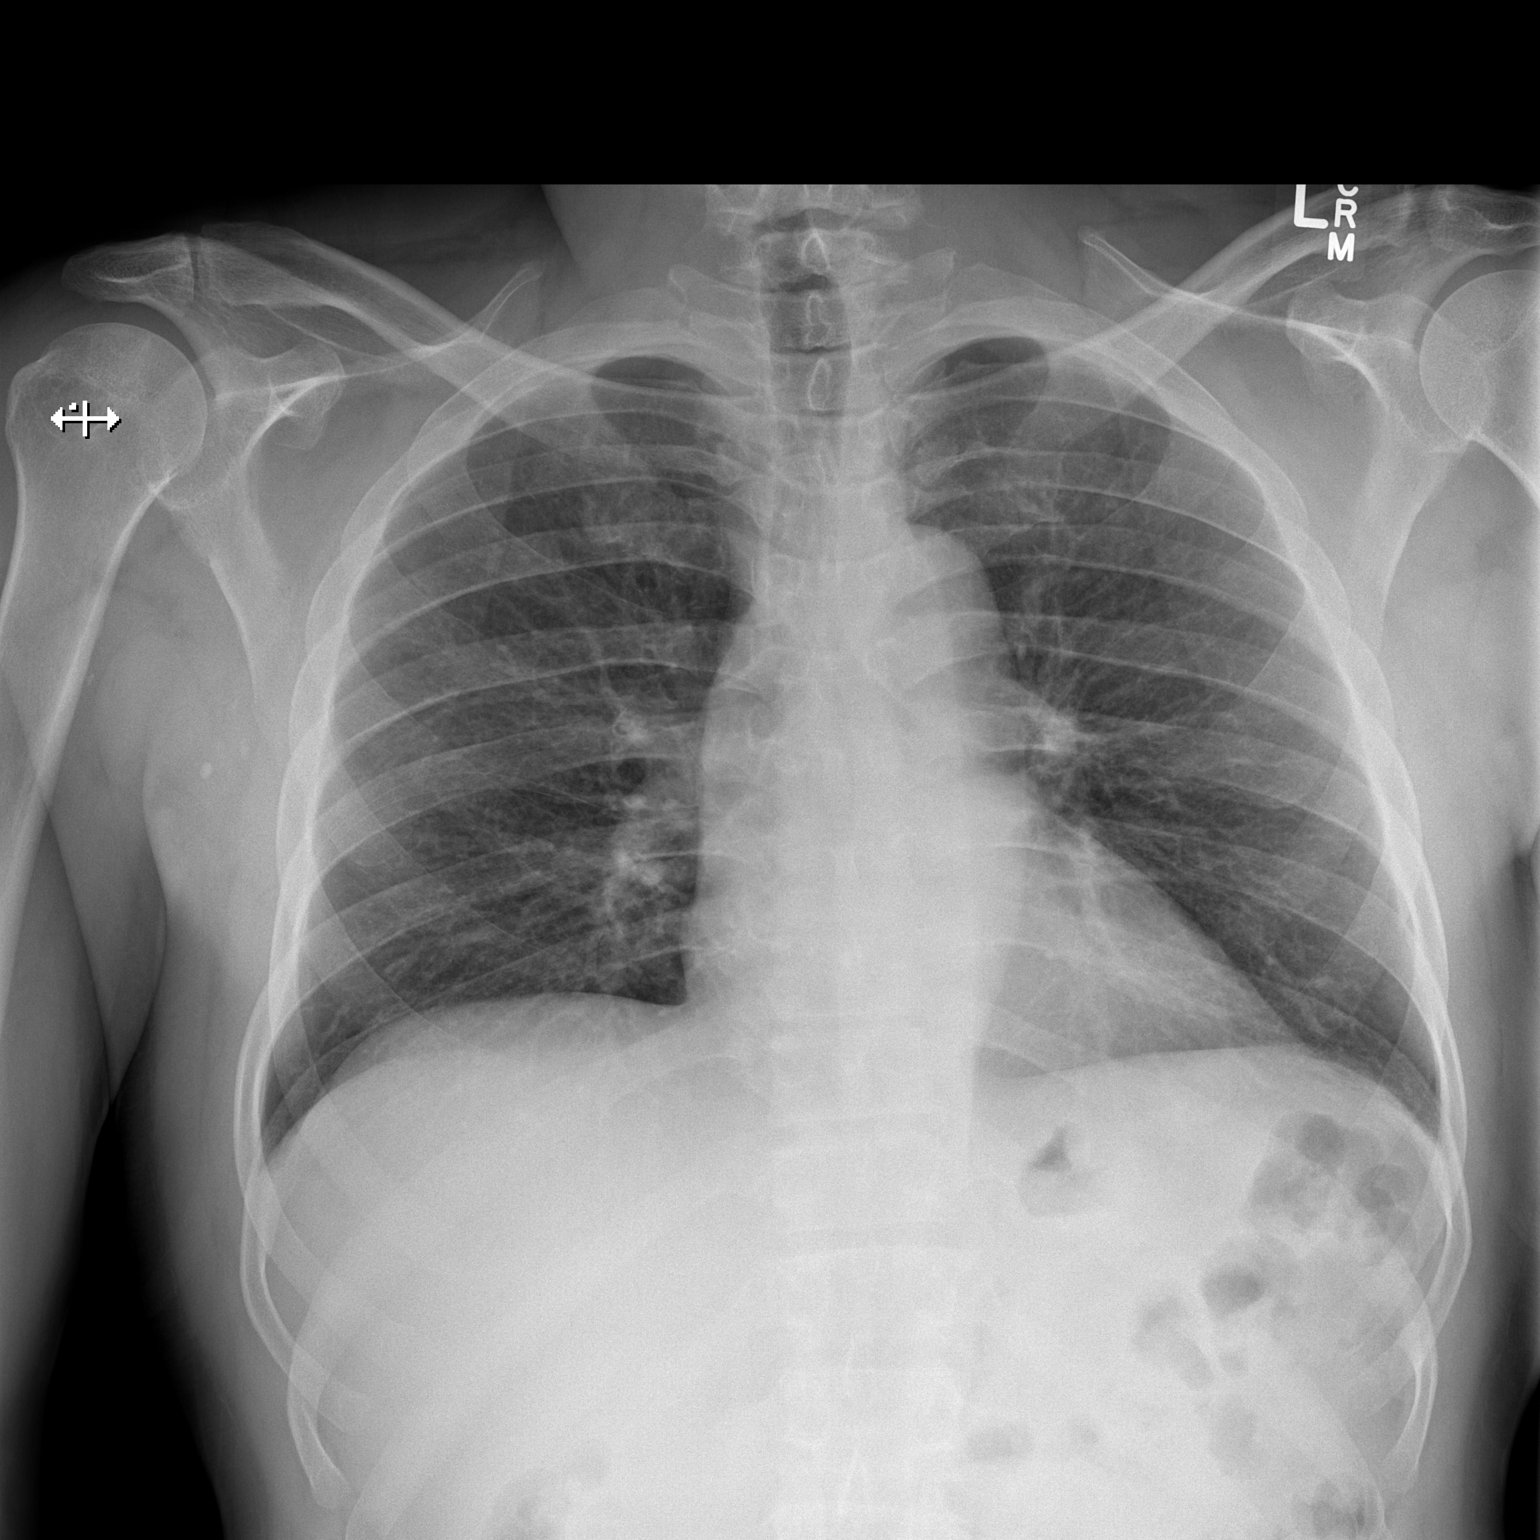

[w chest lat]
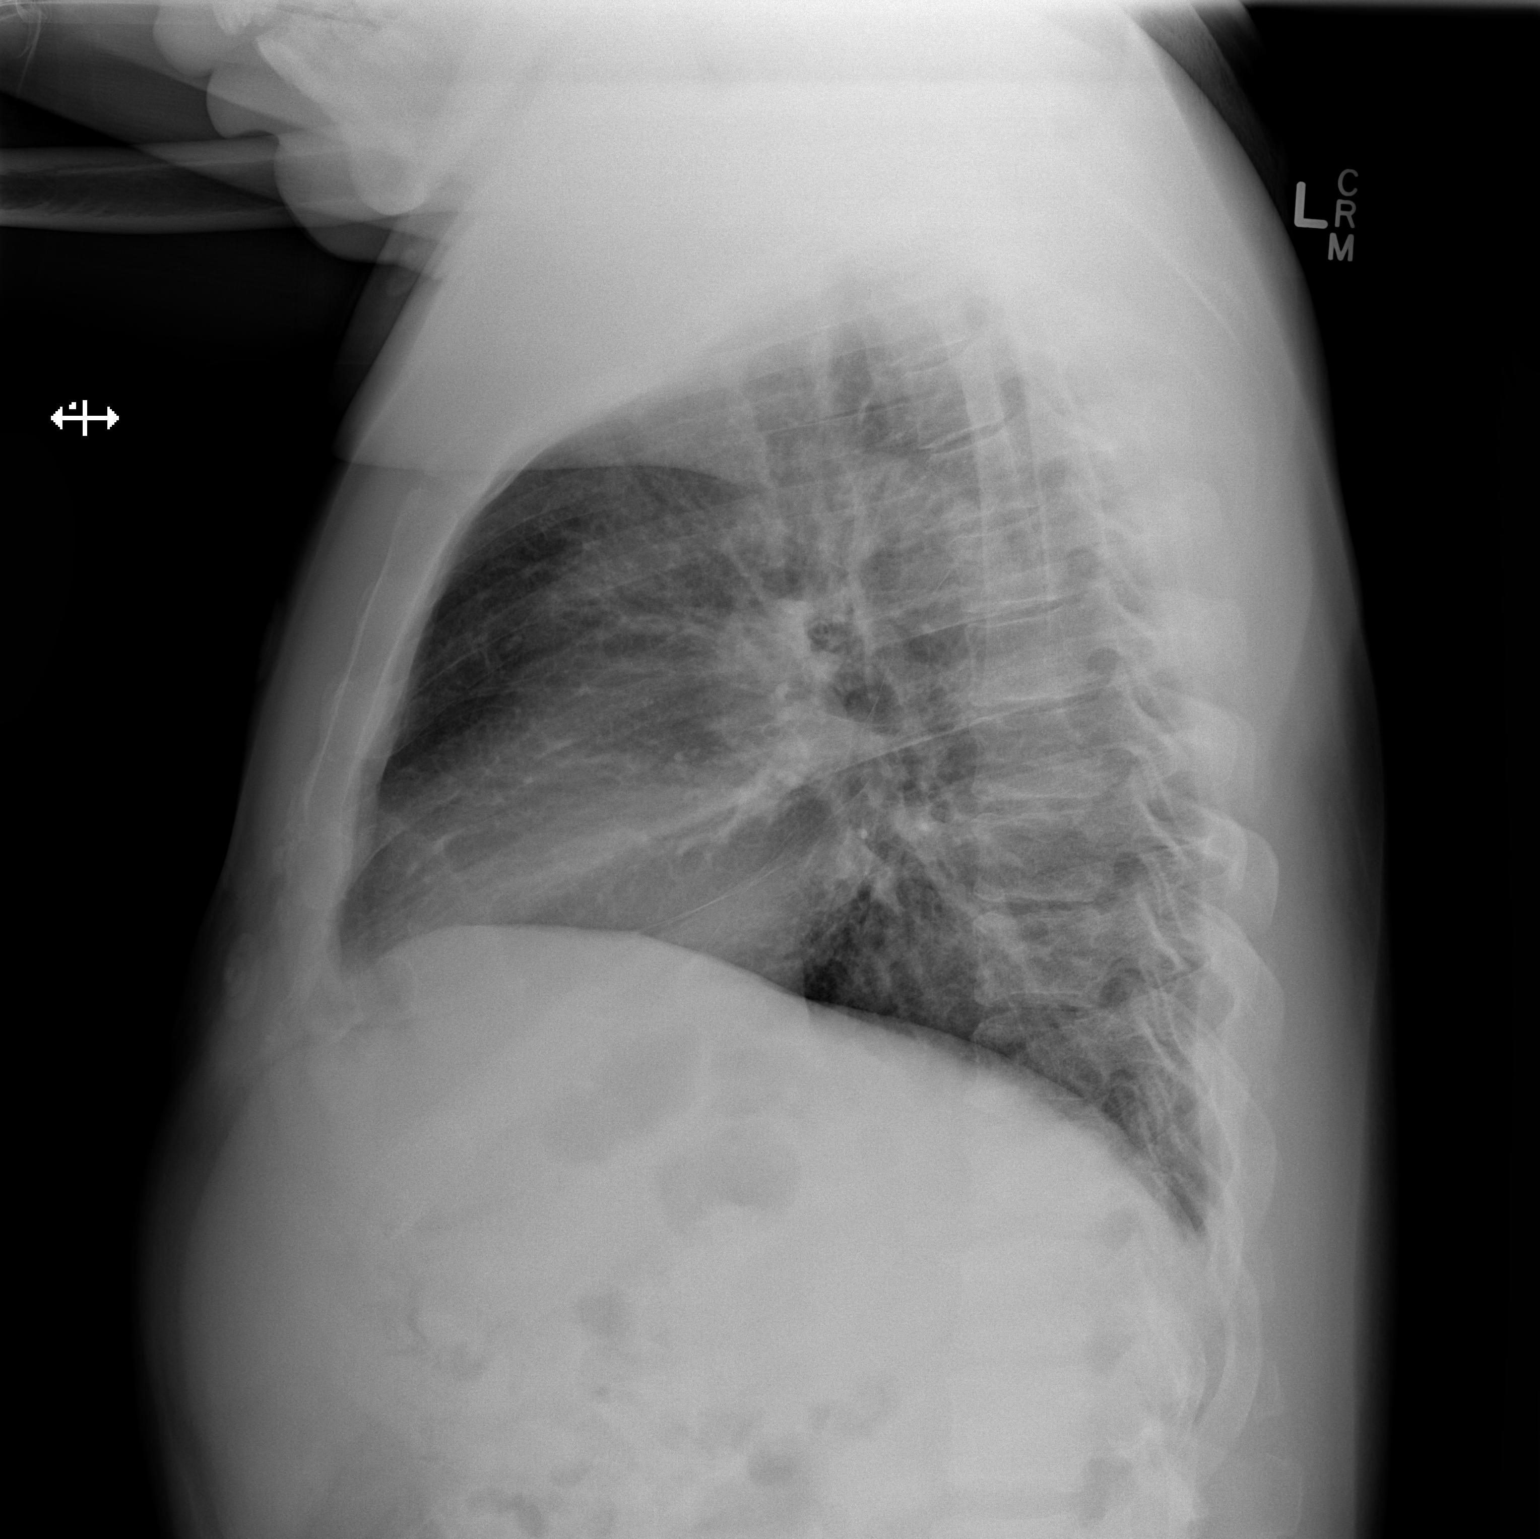

[2 of 2 positions shown; findings below may reference images not displayed]

FINDINGS: Heart size is normal. Mediastinal shadows are normal. The lungs are
clear. No bronchial thickening. No infiltrate, mass, effusion or
collapse. Pulmonary vascularity is normal. No bony abnormality.
IMPRESSION: Normal chest

## 2017-05-15 NOTE — ED Triage Notes (Signed)
Pt states R sided chest pain and sob x 2 weeks, that is worse at night when he's sleeping. Also c/o "lump" to R groin that is very painful. Also c/o increased urination.

## 2017-05-15 NOTE — ED Provider Notes (Signed)
[facility]-EMERGENCY DEPT Provider Note   CSN: [id number] Arrival date & time: 05/15/17  1146     History   Chief Complaint Chief Complaint  Patient presents with  . Groin Pain  . Chest Pain    HPI Darryl Hurley is a 56 y.o. male.  HPI Patient presents with right-sided chest pain. Comes on at night. Goes from his right chest down to his right flank. States it is tingly sensation. No fevers. No cough. No shortness of breath. Does not get the pain during the day usually. No diaphoresis. Chest pain is been going for 3 weeks. Patient also swelling of left groin. States he's had that for a year. No nausea or vomiting. No dysuria. No fevers.   Past Medical History:  Diagnosis Date  . Alcohol abuse   . Angina pectoris   . Arthritis   . Asthma   . Chronic lower back pain   . COPD (chronic obstructive pulmonary disease) (HCC)    on home O2 2L  . Diabetes mellitus without complication (HCC)   . Exertional shortness of breath   . Gout   . Hypertension   . Seizures [facility])     Patient Active Problem List   Diagnosis Date Noted  . Lactic acidosis 01/06/2016  . Lactic acid acidosis 01/06/2016  . Alcohol abuse with intoxication (HCC) 01/06/2016  . COPD with acute exacerbation (HCC) 02/02/2014  . HCAP (healthcare-associated pneumonia) 02/02/2014  . Increased anion gap metabolic acidosis 02/02/2014  . Cocaine abuse 11/18/2013  . Fever 11/17/2013  . Anemia 11/17/2013  . Hypokalemia 11/17/2013  . Polyarthritis 11/17/2013  . Dehydration 11/17/2013  . Type 2 diabetes mellitus (HCC) 05/14/2013  . Chronic obstructive pulmonary disease (COPD) (HCC) 05/12/2013  . Gout 05/12/2013  . Headache 05/12/2013  . Asthma 05/04/2013  . TOBACCO ABUSE 04/27/2009  . Uncontrolled hypertension 04/27/2009    Past Surgical History:  Procedure Laterality Date  . NO PAST SURGERIES         Home Medications    Prior to Admission medications   Medication Sig Start Date End Date Taking?  Authorizing Provider  albuterol (PROVENTIL HFA;VENTOLIN HFA) 108 (90 BASE) MCG/ACT inhaler Inhale 1 puff into the lungs every 6 (six) hours as needed for wheezing or shortness of breath.    [provider]  allopurinol (ZYLOPRIM) 100 MG tablet Take 100 mg by mouth daily.    [provider]  atenolol (TENORMIN) 100 MG tablet Take 1 tablet (100 mg total) by mouth every morning. Reported on 01/06/2016 Patient not taking: Reported on 01/19/2017 01/08/16   Alison Murray, MD  folic acid (FOLVITE) 1 MG tablet Take 1 tablet (1 mg total) by mouth daily. Patient not taking: Reported on 01/19/2017 01/08/16   Alison Murray, MD  ibuprofen (ADVIL,MOTRIN) 200 MG tablet Take 200-400 mg by mouth every 6 (six) hours as needed for headache or moderate pain.    [provider]  losartan (COZAAR) 100 MG tablet Take 1 tablet (100 mg total) by mouth every morning. Reported on 01/06/2016 Patient not taking: Reported on 01/19/2017 01/08/16   Alison Murray, MD  metFORMIN (GLUCOPHAGE) 1000 MG tablet Take 1 tablet (1,000 mg total) by mouth 2 (two) times daily with a meal. 01/08/16   Alison Murray, MD  omeprazole (PRILOSEC) 20 MG capsule Take 20 mg by mouth daily.    [provider]  rosuvastatin (CRESTOR) 40 MG tablet Take 40 mg by mouth daily.    [provider]  thiamine 100 MG tablet Take 1 tablet (100 mg total) by mouth daily. Patient not taking: Reported on 01/19/2017 01/08/16   Alison Murrayevine, Alma M, MD    Family History Family History  Problem Relation Age of Onset  . Diabetes type II Mother   . Depression Father   . Suicidality Father   . Asthma Brother     Social History Social History  Substance Use Topics  . Smoking status: Current Every Day Smoker    Packs/day: 0.50    Years: 40.00    Types: Cigarettes  . Smokeless tobacco: Never Used     Comment: 05/12/2013 'eased off smoking since last month"  . Alcohol use 0.0 oz/week     Comment: 4 40 oz beers per day     Allergies    Patient has no known allergies.   Review of Systems Review of Systems  Constitutional: Negative for activity change and appetite change.  HENT: Negative for congestion.   Eyes: Negative for pain.  Respiratory: Negative for chest tightness and shortness of breath.   Cardiovascular: Positive for chest pain. Negative for leg swelling.  Gastrointestinal: Positive for abdominal pain. Negative for diarrhea, nausea and vomiting.  Genitourinary: Positive for flank pain and scrotal swelling.  Musculoskeletal: Negative for back pain and neck stiffness.  Skin: Negative for rash.  Neurological: Negative for weakness, numbness and headaches.  Psychiatric/Behavioral: Negative for behavioral problems.     Physical Exam Updated Vital Signs BP (!) 162/99   Pulse 73   Temp 98.2 F (36.8 C) (Oral)   Resp (!) 26   Ht 5\' 5"  (1.651 m)   Wt 82.6 kg (182 lb)   SpO2 97%   BMI 30.29 kg/m   Physical Exam  Constitutional: He appears well-developed.  HENT:  Head: Atraumatic.  Neck: Neck supple.  Cardiovascular: Normal rate.   Pulmonary/Chest: Effort normal. He exhibits no tenderness.  Abdominal: Soft. There is no tenderness.  Genitourinary:  Genitourinary Comments: Hernia palpated in left hemiscrotum. Reducible. No skin changes.  Musculoskeletal: Normal range of motion. He exhibits no edema.  Neurological: He is alert.  Skin: Skin is warm. Capillary refill takes less than 2 seconds.     ED Treatments / Results  Labs (all labs ordered are listed, but only abnormal results are displayed) Labs Reviewed  CBC - Abnormal; Notable for the following:       Result Value   RDW 15.6 (*)    All other components within normal limits  URINALYSIS, ROUTINE W REFLEX MICROSCOPIC - Abnormal; Notable for the following:    Protein, ur 100 (*)    All other components within normal limits  COMPREHENSIVE METABOLIC PANEL - Abnormal; Notable for the following:    Glucose, Bld 119 (*)    AST 48 (*)    Total  Bilirubin 1.3 (*)    All other components within normal limits  I-STAT TROPOININ, ED    EKG  EKG Interpretation  Date/Time:  Wednesday May 15 2017 12:08:12 EDT Ventricular Rate:  89 PR Interval:  138 QRS Duration: 74 QT Interval:  358 QTC Calculation: 435 R Axis:   71 Text Interpretation:  Normal sinus rhythm Normal ECG Confirmed by Rubin PayorPICKERING  MD, Harrold DonathNATHAN [phone] on 05/15/2017 1:21:09 PM       Radiology Dg Chest 2 View  Result Date: 05/15/2017 CLINICAL DATA:  Right-sided chest pain and shortness of breath over the last 2 weeks. EXAM: CHEST  2 VIEW COMPARISON:  01/06/2016 FINDINGS: Heart size is normal. Mediastinal shadows  are normal. The lungs are clear. No bronchial thickening. No infiltrate, mass, effusion or collapse. Pulmonary vascularity is normal. No bony abnormality. IMPRESSION: Normal chest Electronically Signed   By: Paulina Fusi M.D.   On: 05/15/2017 13:05    Procedures Procedures (including critical care time)  Medications Ordered in ED Medications - No data to display   Initial Impression / Assessment and Plan / ED Course  I have reviewed the triage vital signs and the nursing notes.  Pertinent labs & imaging results that were available during my care of the patient were reviewed by me and considered in my medical decision making (see chart for details).     Patient with right-sided chest pain. EKG and lab work reassuring. Enzymes negative. Also left inguinal hernia. Reducible. No signs of obstruction. Will follow-up with general surgery.  Final Clinical Impressions(s) / ED Diagnoses   Final diagnoses:  Chest pain, unspecified type  Unilateral inguinal hernia without obstruction or gangrene, recurrence not specified    New Prescriptions New Prescriptions   No medications on file     Benjiman Core, MD 05/15/17 1418

## 2017-05-15 NOTE — ED Notes (Signed)
ED Provider at bedside. 

## 2017-07-26 ENCOUNTER — Ambulatory Visit: Payer: Self-pay | Admitting: Surgery

## 2017-07-30 ENCOUNTER — Ambulatory Visit: Payer: Self-pay | Admitting: Surgery

## 2017-08-22 ENCOUNTER — Emergency Department (HOSPITAL_COMMUNITY)
Admission: EM | Admit: 2017-08-22 | Discharge: 2017-08-23 | Disposition: A | Discharge status: Home / Self Care | Payer: Medicaid Other | Attending: Emergency Medicine | Admitting: Emergency Medicine

## 2017-08-22 ENCOUNTER — Encounter (HOSPITAL_COMMUNITY): Payer: Self-pay | Admitting: Emergency Medicine

## 2017-08-22 DIAGNOSIS — E119 Type 2 diabetes mellitus without complications: Secondary | ICD-10-CM | POA: Diagnosis not present

## 2017-08-22 DIAGNOSIS — Z79899 Other long term (current) drug therapy: Secondary | ICD-10-CM | POA: Insufficient documentation

## 2017-08-22 DIAGNOSIS — I10 Essential (primary) hypertension: Secondary | ICD-10-CM | POA: Insufficient documentation

## 2017-08-22 DIAGNOSIS — J449 Chronic obstructive pulmonary disease, unspecified: Secondary | ICD-10-CM | POA: Insufficient documentation

## 2017-08-22 DIAGNOSIS — F17210 Nicotine dependence, cigarettes, uncomplicated: Secondary | ICD-10-CM | POA: Insufficient documentation

## 2017-08-22 DIAGNOSIS — Z7984 Long term (current) use of oral hypoglycemic drugs: Secondary | ICD-10-CM | POA: Diagnosis not present

## 2017-08-22 DIAGNOSIS — M62838 Other muscle spasm: Secondary | ICD-10-CM | POA: Diagnosis not present

## 2017-08-22 DIAGNOSIS — R51 Headache: Secondary | ICD-10-CM | POA: Diagnosis present

## 2017-08-22 DIAGNOSIS — G44309 Post-traumatic headache, unspecified, not intractable: Secondary | ICD-10-CM | POA: Diagnosis not present

## 2017-08-22 LAB — CBG MONITORING, ED: Glucose-Capillary: 150 mg/dL — ABNORMAL HIGH (ref 65–99)

## 2017-08-22 NOTE — ED Triage Notes (Signed)
Pt BIB EMS from home with reports of head and neck pain x 2.5 days, generalized weakness, and "trouble talking" today, Per EMS pt fiance unable to tell when the trouble talking stared but said "some time today, maybe noon." ETOH on board. P states "not much" but will no tell this nurse how much. When asked  What he drank he said "liquor"

## 2017-08-22 NOTE — ED Provider Notes (Signed)
TIME SEEN: 11:59 PM  CHIEF COMPLAINT: headache, neck pain  HPI: Pt is a 56 year old male with history of alcohol abuse, COPD, hypertension, diabetes, gout who presents to the emergency department with left-sided headache and right-sided neck pain that started 2-1/2 days ago. States he woke up with these symptoms. He has had similar headaches before after a head injury approximately a year ago. Denies any new head injury. He denies numbness, tingling or focal weakness. He denies fever.He normally ambulates with a cane. Neck pain is worse with trying to turn his head to the left side.  Also reports the past 2 months he has had right knee pain and swelling consistent with his previous gout. No new injury to the knee.  States he did drink beer earlier today and then 3 drinks with liquor. Denies any drug use.  ROS: See HPI Constitutional: no fever  Eyes: no drainage  ENT: no runny nose   Cardiovascular:  no chest pain  Resp: no SOB  GI: no vomiting GU: no dysuria Integumentary: no rash  Allergy: no hives  Musculoskeletal: right knee pain for 2 months  Neurological: no slurred speech ROS otherwise negative  PAST MEDICAL HISTORY/PAST SURGICAL HISTORY:  Past Medical History:  Diagnosis Date  . Alcohol abuse   . Angina pectoris   . Arthritis   . Asthma   . Chronic lower back pain   . COPD (chronic obstructive pulmonary disease) (HCC)    on home O2 2L  . Diabetes mellitus without complication (HCC)   . Exertional shortness of breath   . Gout   . Hypertension   . Seizures (HCC)     MEDICATIONS:  Prior to Admission medications   Medication Sig Start Date End Date Taking? Authorizing Provider  albuterol (PROVENTIL HFA;VENTOLIN HFA) 108 (90 BASE) MCG/ACT inhaler Inhale 1 puff into the lungs every 6 (six) hours as needed for wheezing or shortness of breath.    [provider]  allopurinol (ZYLOPRIM) 100 MG tablet Take 100 mg by mouth daily.    [provider]  atenolol  (TENORMIN) 100 MG tablet Take 1 tablet (100 mg total) by mouth every morning. Reported on 01/06/2016 Patient not taking: Reported on 01/19/2017 01/08/16   Alison Murray, MD  folic acid (FOLVITE) 1 MG tablet Take 1 tablet (1 mg total) by mouth daily. Patient not taking: Reported on 01/19/2017 01/08/16   Alison Murray, MD  ibuprofen (ADVIL,MOTRIN) 200 MG tablet Take 200-400 mg by mouth every 6 (six) hours as needed for headache or moderate pain.    [provider]  losartan (COZAAR) 100 MG tablet Take 1 tablet (100 mg total) by mouth every morning. Reported on 01/06/2016 Patient not taking: Reported on 01/19/2017 01/08/16   Alison Murray, MD  metFORMIN (GLUCOPHAGE) 1000 MG tablet Take 1 tablet (1,000 mg total) by mouth 2 (two) times daily with a meal. 01/08/16   Alison Murray, MD  omeprazole (PRILOSEC) 20 MG capsule Take 20 mg by mouth daily.    [provider]  rosuvastatin (CRESTOR) 40 MG tablet Take 40 mg by mouth daily.    [provider]  thiamine 100 MG tablet Take 1 tablet (100 mg total) by mouth daily. Patient not taking: Reported on 01/19/2017 01/08/16   Alison Murray, MD    ALLERGIES:  No Known Allergies  SOCIAL HISTORY:  Social History  Substance Use Topics  . Smoking status: Current Every Day Smoker    Packs/day: 0.50  Years: 40.00    Types: Cigarettes  . Smokeless tobacco: Never Used     Comment: 05/12/2013 'eased off smoking since last month"  . Alcohol use 0.0 oz/week     Comment: 4 40 oz beers per day    FAMILY HISTORY: Family History  Problem Relation Age of Onset  . Diabetes type II Mother   . Depression Father   . Suicidality Father   . Asthma Brother     EXAM: BP (!) 146/98 (BP Location: Right Arm)   Pulse (!) 112   Temp 98.4 F (36.9 C) (Oral)   Resp (!) 26   Ht  (1.651 m)   Wt 82.6 kg (182 lb)   SpO2 95%   BMI 30.29 kg/m  CONSTITUTIONAL: Alert and oriented and responds appropriately to questions. Chronically ill-appearing,  appears uncomfortable, smells of alcohol HEAD: Normocephalic, atraumatic EYES: Conjunctivae clear, pupils appear equal, EOMI ENT: normal nose; moist mucous membranes NECK: Supple, no meningismus, no nuchal rigidity, no LAD, tender to palpation over the right trapezius muscle, patient has difficulty turning his head to the left without pain, no midline spinal tenderness or step-off or deformity  CARD: regular and tachycardic; S1 and S2 appreciated; no murmurs, no clicks, no rubs, no gallops RESP: Normal chest excursion without splinting or tachypnea; breath sounds clear and equal bilaterally; no wheezes, no rhonchi, no rales, no hypoxia or respiratory distress, speaking full sentences ABD/GI: Normal bowel sounds; non-distended; soft, non-tender, no rebound, no guarding, no peritoneal signs, no hepatosplenomegaly BACK:  The back appears normal and is non-tender to palpation, there is no CVA tenderness, no midline spinal tenderness or step-off or deformity EXT: Patient has swelling noted to the right knee with tenderness to just light touch. There is no bony abnormality. Slightly warm but not red. Decreased range of motion in the right knee secondary to pain. 2+ DP pulses bilaterally. Otherwise normal ROM in all joints; otherwise extremities are non-tender to palpation; no edema; normal capillary refill; no cyanosis, no calf tenderness or swelling    SKIN: Normal color for age and race; warm; no rash NEURO: Moves all extremities equally, has difficulty lifting and moving the right leg secondary to pain in the knee from his gout; normal movement in the left lower extremity. Normal strength in the bilateral upper extremities. Normal sensation diffusely. Cranial nerves II through XII intact, normal speech PSYCH: The patient's mood and manner are appropriate. Grooming and personal hygiene are appropriate.  MEDICAL DECISION MAKING: Patient here with complaints of headache that appears to be something that is  intermittent and also chronic for the patient since a head injury approximately year ago. He is also complaining of right neck pain and this seems to be a trapezius muscle spasm. No midline spinal tenderness. He does not appear to have any new focal neurologic deficits. He is however poor historian and has a history of alcohol abuse and does appear intoxicated and is tachycardic. We'll obtain a CT of the head and cervical spine just to ensure there is no other acute abnormality seen.  Labs and urine ordered by triage nurse and are pending. EKG shows no new ischemic change. We'll hydrate patient, give Toradol and Robaxin for symptomatic relief. I do not think that he has septic arthritis. He has chronic history of gout and reports this for similar. Suspect that his gout is secondary to his chronic alcohol abuse. No injury to the right leg. Doubt fracture. Neurovascularly intact distally.  ED PROGRESS: Patient's CT head  and cervical spine show no acute abnormality. Labs unremarkable other than alcohol level of 87. Drug screen negative. Urine shows no sign of infection.  He reports his headache is completely gone after Toradol and Robaxin and there is some improvement in his neck pain is still has decreased range of motion secondary to pain. Doubt cauda equina, spinal stenosis, epidural abscess or hematoma, discitis, transverse myelitis, meningitis. We'll give dose of Dilaudid but I feel he is safe to be discharged him with NSAIDs and muscle relaxers. I do not comfortable discharging him with narcotics given his history of alcohol abuse. He does have a primary care for follow-up. Have recommended massage, heat to this area. Discussed return precautions. Patient comfortable with this plan.   At this time, I do not feel there is any life-threatening condition present. I have reviewed and discussed all results (EKG, imaging, lab, urine as appropriate) and exam findings with patient/family. I have reviewed nursing  notes and appropriate previous records.  I feel the patient is safe to be discharged home without further emergent workup and can continue workup as an outpatient as needed. Discussed usual and customary return precautions. Patient/family verbalize understanding and are comfortable with this plan.  Outpatient follow-up has been provided if needed. All questions have been answered.   EKG Interpretation  Date/Time:  Thursday August 22 2017 23:42:05 EDT Ventricular Rate:  109 PR Interval:    QRS Duration: 79 QT Interval:  301 QTC Calculation: 406 R Axis:   58 Text Interpretation:  Sinus tachycardia Borderline repol abnrm, inferolateral leads Baseline wander in lead(s) V5 No significant change since last tracing Confirmed by Daysi Boggan, Baxter Hire [id number]) on 08/22/2017 11:59:24 PM         Dyani Babel, Layla Maw, DO 08/23/17 [id number]

## 2017-08-23 ENCOUNTER — Emergency Department (HOSPITAL_COMMUNITY): Payer: Medicaid Other

## 2017-08-23 LAB — CBC
HCT: 38.2 % — ABNORMAL LOW (ref 39.0–52.0)
HEMOGLOBIN: 12.5 g/dL — AB (ref 13.0–17.0)
MCH: 29.9 pg (ref 26.0–34.0)
MCHC: 32.7 g/dL (ref 30.0–36.0)
MCV: 91.4 fL (ref 78.0–100.0)
PLATELETS: 392 10*3/uL (ref 150–400)
RBC: 4.18 MIL/uL — ABNORMAL LOW (ref 4.22–5.81)
RDW: 15.5 % (ref 11.5–15.5)
WBC: 7.8 10*3/uL (ref 4.0–10.5)

## 2017-08-23 LAB — URINALYSIS, ROUTINE W REFLEX MICROSCOPIC
BACTERIA UA: NONE SEEN
BILIRUBIN URINE: NEGATIVE
Glucose, UA: NEGATIVE mg/dL
Hgb urine dipstick: NEGATIVE
Ketones, ur: NEGATIVE mg/dL
LEUKOCYTES UA: NEGATIVE
NITRITE: NEGATIVE
PH: 5 (ref 5.0–8.0)
Protein, ur: 30 mg/dL — AB
SPECIFIC GRAVITY, URINE: 1.012 (ref 1.005–1.030)
Squamous Epithelial / LPF: NONE SEEN

## 2017-08-23 LAB — COMPREHENSIVE METABOLIC PANEL
ALBUMIN: 3.1 g/dL — AB (ref 3.5–5.0)
ALT: 15 U/L — ABNORMAL LOW (ref 17–63)
ANION GAP: 15 (ref 5–15)
AST: 31 U/L (ref 15–41)
Alkaline Phosphatase: 56 U/L (ref 38–126)
BILIRUBIN TOTAL: 1.6 mg/dL — AB (ref 0.3–1.2)
BUN: 14 mg/dL (ref 6–20)
CO2: 19 mmol/L — ABNORMAL LOW (ref 22–32)
Calcium: 9.2 mg/dL (ref 8.9–10.3)
Chloride: 102 mmol/L (ref 101–111)
Creatinine, Ser: 1.24 mg/dL (ref 0.61–1.24)
GFR calc Af Amer: >60 mL/min (ref >60)
GFR calc non Af Amer: >60 mL/min (ref >60)
GLUCOSE: 169 mg/dL — AB (ref 65–99)
POTASSIUM: 4.1 mmol/L (ref 3.5–5.1)
Sodium: 136 mmol/L (ref 135–145)
TOTAL PROTEIN: 7.2 g/dL (ref 6.5–8.1)

## 2017-08-23 LAB — I-STAT CHEM 8, ED
BUN: 19 mg/dL (ref 6–20)
CALCIUM ION: 1.11 mmol/L — AB (ref 1.15–1.40)
CHLORIDE: 104 mmol/L (ref 101–111)
Creatinine, Ser: 1.2 mg/dL (ref 0.61–1.24)
Glucose, Bld: 176 mg/dL — ABNORMAL HIGH (ref 65–99)
HCT: 40 % (ref 39.0–52.0)
Hemoglobin: 13.6 g/dL (ref 13.0–17.0)
Potassium: 4.6 mmol/L (ref 3.5–5.1)
SODIUM: 138 mmol/L (ref 135–145)
TCO2: 23 mmol/L (ref 22–32)

## 2017-08-23 LAB — DIFFERENTIAL
BASOS PCT: 0 %
Basophils Absolute: 0 10*3/uL (ref 0.0–0.1)
EOS ABS: 0.1 10*3/uL (ref 0.0–0.7)
EOS PCT: 1 %
LYMPHS ABS: 1.6 10*3/uL (ref 0.7–4.0)
Lymphocytes Relative: 20 %
Monocytes Absolute: 0.6 10*3/uL (ref 0.1–1.0)
Monocytes Relative: 7 %
NEUTROS PCT: 72 %
Neutro Abs: 5.6 10*3/uL (ref 1.7–7.7)

## 2017-08-23 LAB — RAPID URINE DRUG SCREEN, HOSP PERFORMED
Amphetamines: NOT DETECTED
Barbiturates: NOT DETECTED
Benzodiazepines: NOT DETECTED
Cocaine: NOT DETECTED
OPIATES: NOT DETECTED
Tetrahydrocannabinol: NOT DETECTED

## 2017-08-23 LAB — I-STAT TROPONIN, ED: TROPONIN I, POC: 0 ng/mL (ref 0.00–0.08)

## 2017-08-23 LAB — PROTIME-INR
INR: 0.98
PROTHROMBIN TIME: 12.9 s (ref 11.4–15.2)

## 2017-08-23 LAB — ETHANOL: ALCOHOL ETHYL (B): 87 mg/dL — AB (ref <5)

## 2017-08-23 LAB — APTT: aPTT: 34 seconds (ref 24–36)

## 2017-08-23 IMAGING — CT CT HEAD W/O CM
3 of 7 series · 15 of 47 positions shown, 18 images · non-contrast
Comparison: [DATE]

CLINICAL DATA: Pain in the right side of the head and neck for
days. History of seizures and hypertension.

EXAM:
CT HEAD WITHOUT CONTRAST
CT CERVICAL SPINE WITHOUT CONTRAST
TECHNIQUE: Multidetector CT imaging of the head and cervical spine was
performed following the standard protocol without intravenous
contrast. Multiplanar CT image reconstructions of the cervical spine
were also generated.

[Series 6: head 3.0 mpr cor · coronal · 0.29mm/px · 3 of 72 slices shown]
[im 24/72  brain]
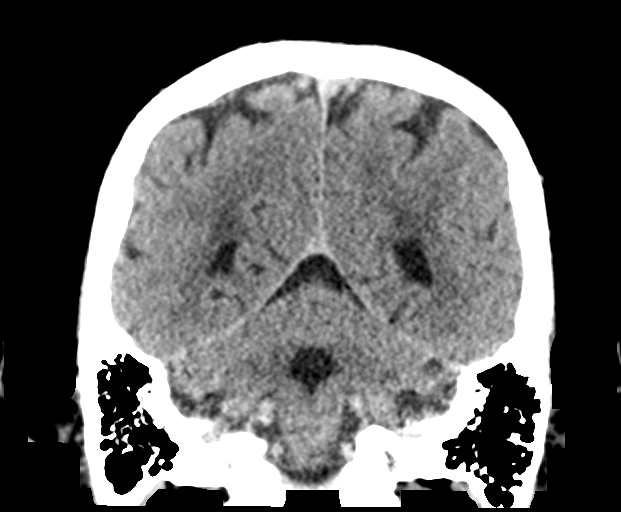
[im 36/72  brain]
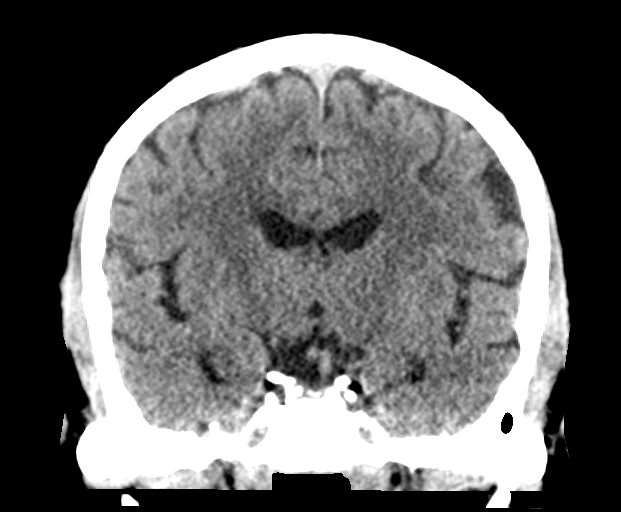
[im 48/72  brain]
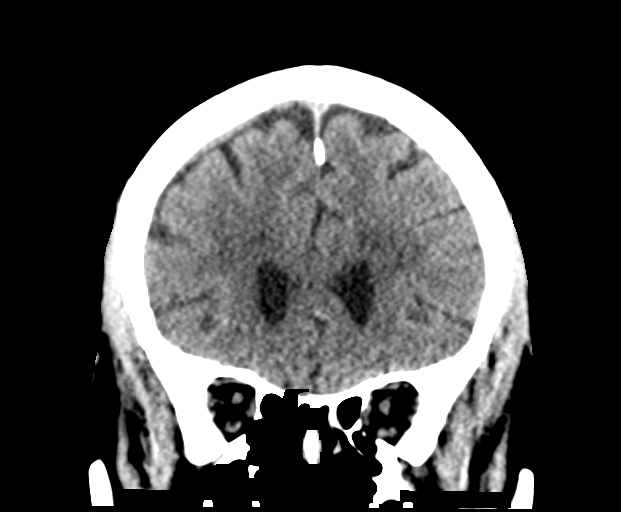

[Series 7: head 3.0 mpr sag · sagittal · 0.29mm/px · 1 of 58 slices shown]
[im 29/58  brain]
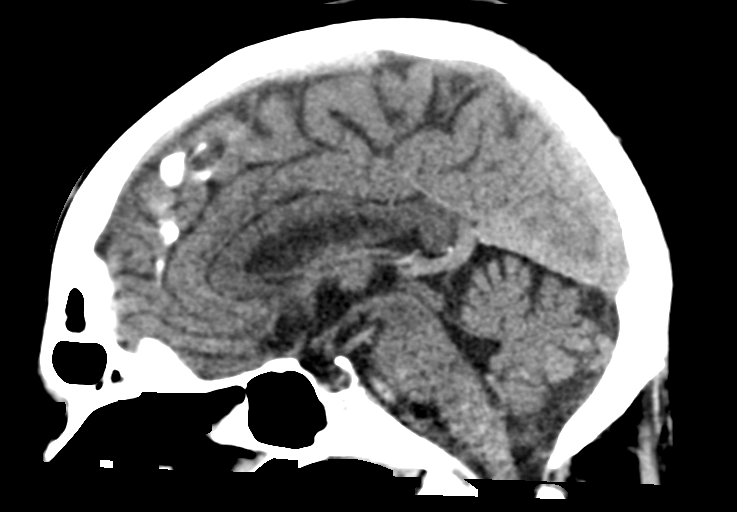

[Series 13: orthogonal axial st · axial · 0.21mm/px · z∈[-268,-147]mm · 11 of 85 slices shown, 14 images]
[im 8/85  brain]
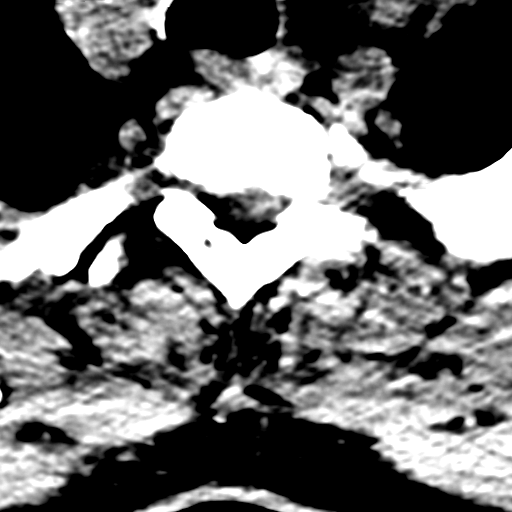
[im 8/85  bone]
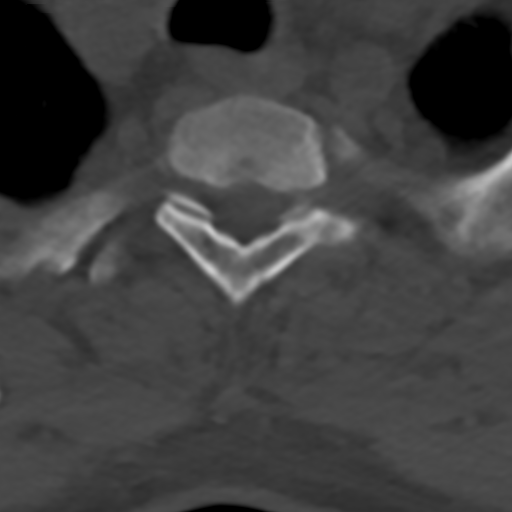
[im 15/85  brain]
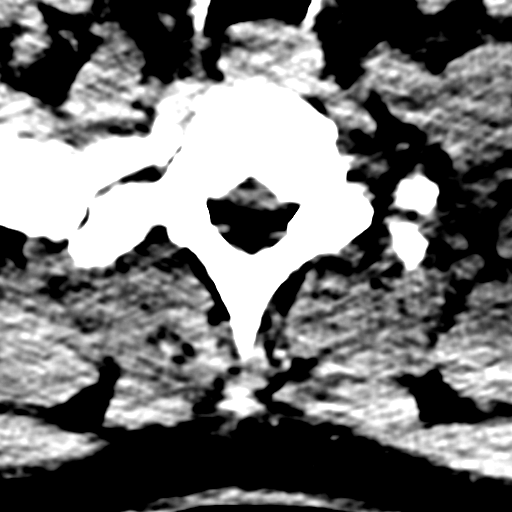
[im 22/85  brain]
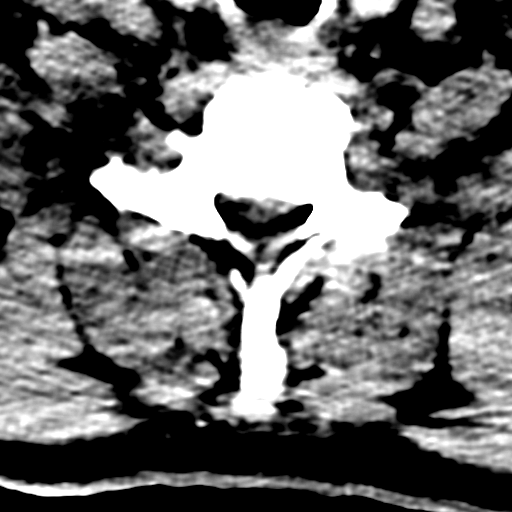
[im 29/85  brain]
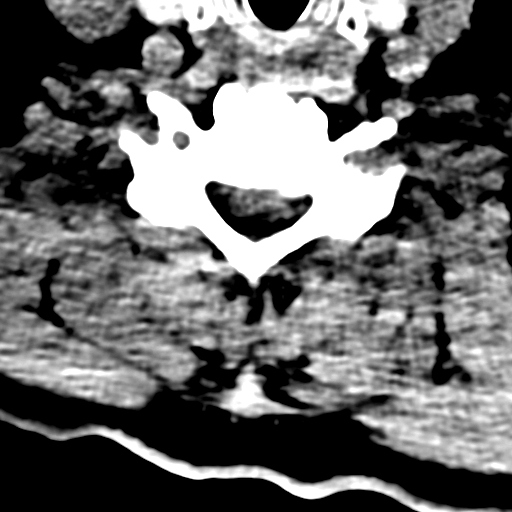
[im 36/85  brain]
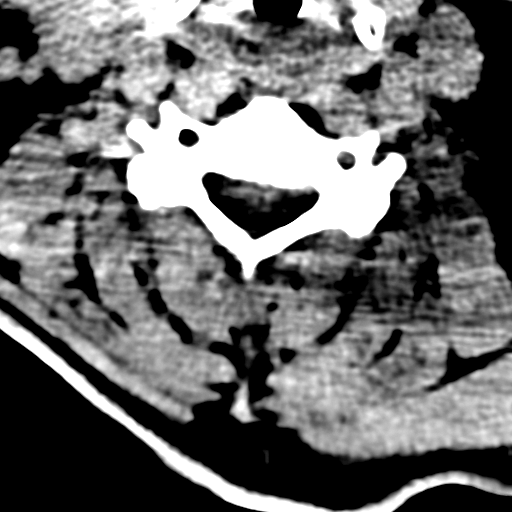
[im 36/85  bone]
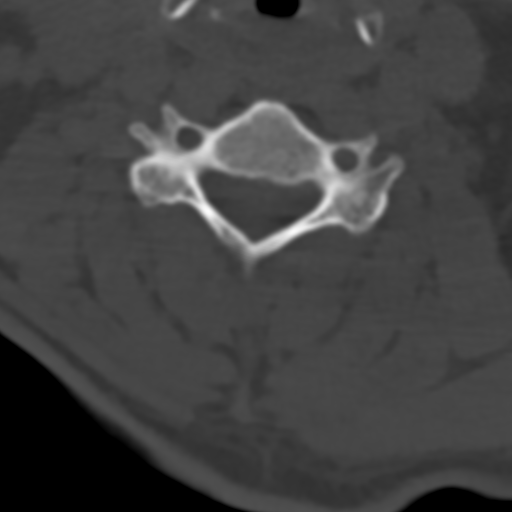
[im 43/85  brain]
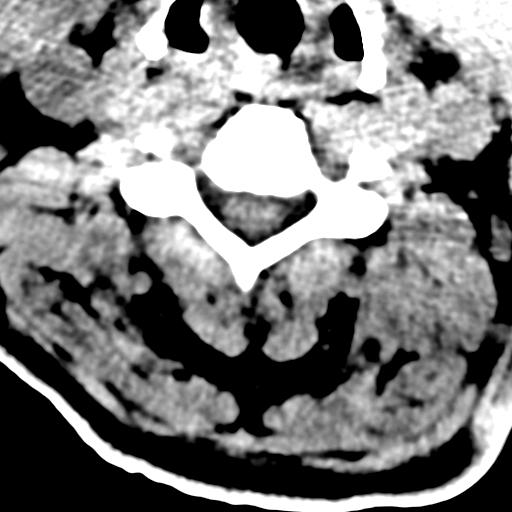
[im 50/85  brain]
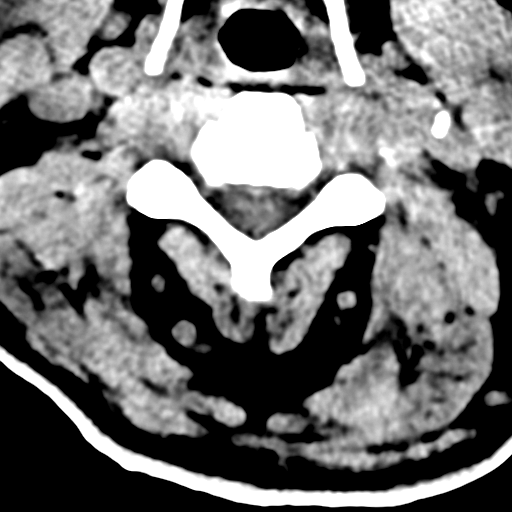
[im 57/85  brain]
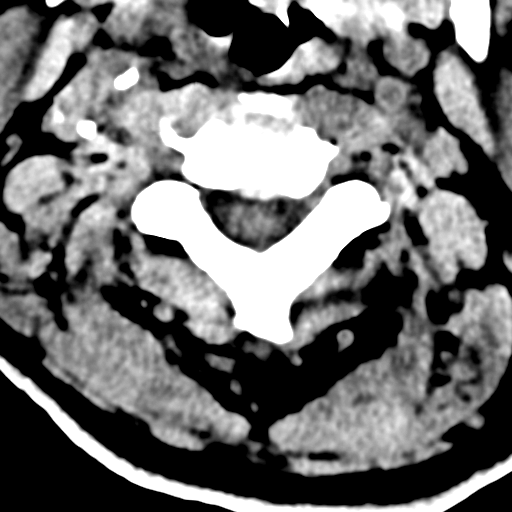
[im 64/85  brain]
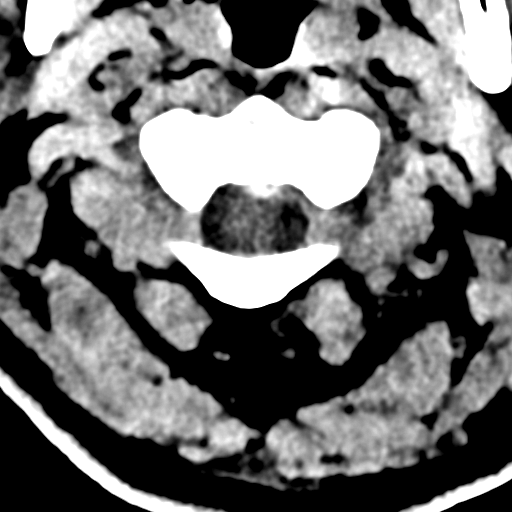
[im 64/85  bone]
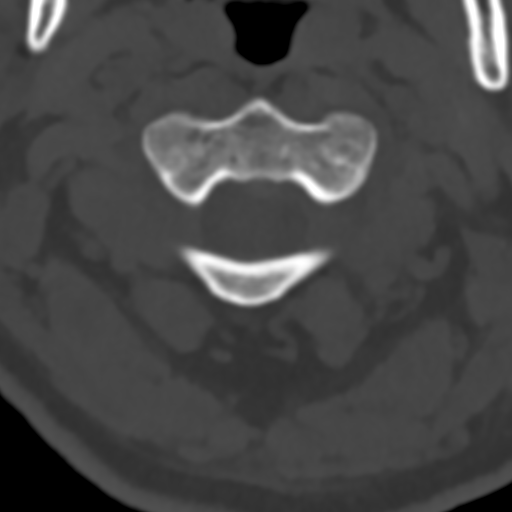
[im 71/85  brain]
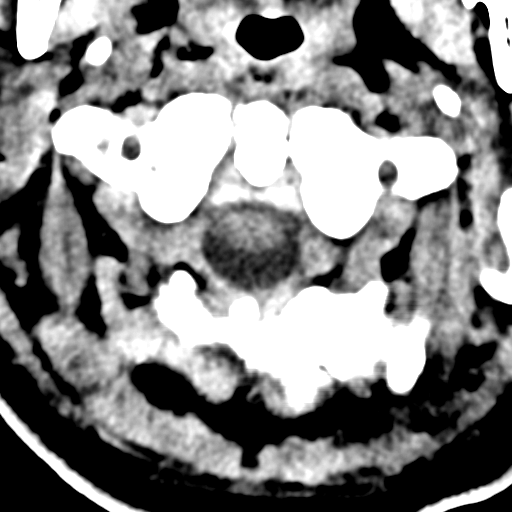
[im 78/85  brain]
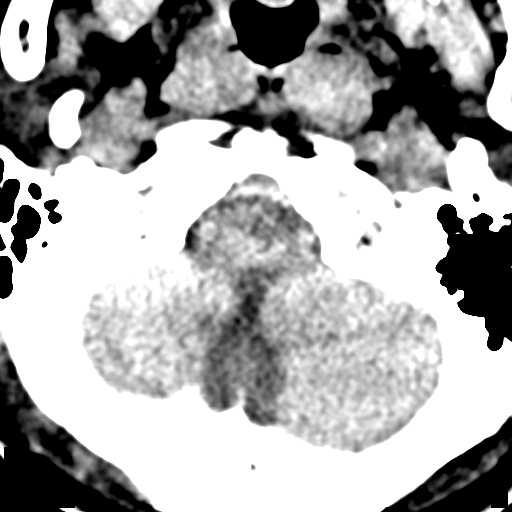

[15 of 47 positions shown; findings below may reference images not displayed]

FINDINGS: CT HEAD FINDINGS

Brain: Mild diffuse cerebral atrophy for age. No ventricular
dilatation. Patchy low-attenuation changes in the deep white matter
are nonspecific but likely to represent small vessel ischemia. No
change since previous study. No mass effect or midline shift. No
abnormal extra-axial fluid collections. Gray-white matter junctions
are distinct. Basal cisterns are not effaced. No acute intracranial
hemorrhage.

Vascular: Vascular calcifications in the internal carotid artery's.

Skull: Calvarium appears intact.

Sinuses/Orbits: Old fracture deformity of the left medial orbital
wall. Mucosal thickening in the paranasal sinuses. No acute
air-fluid levels. Mastoid air cells are clear.

Other: None.

CT CERVICAL SPINE FINDINGS

Alignment: Normal.

Skull base and vertebrae: No acute fracture. No primary bone lesion
or focal pathologic process.

Soft tissues and spinal canal: No prevertebral fluid or swelling. No
visible canal hematoma.

Disc levels: Degenerative changes with disc space narrowing and
endplate hypertrophic changes at C5-6, C6-7, and C7-T1 levels. No
evidence of significant bone encroachment upon the central canal.

Upper chest: Negative.

Other: None.
IMPRESSION: 1. No acute intracranial abnormalities. Mild diffuse atrophy and low
attenuation white matter changes, likely small vessel ischemia.
2. Normal alignment of the cervical spine. Degenerative changes. No
acute displaced fractures identified.

## 2017-08-23 MED ORDER — METHOCARBAMOL 500 MG PO TABS
500.0000 mg | ORAL_TABLET | Freq: Once | ORAL | Status: AC
  Administered 2017-08-23: 500 mg via ORAL
  Filled 2017-08-23: qty 1

## 2017-08-23 MED ORDER — KETOROLAC TROMETHAMINE 30 MG/ML IJ SOLN
30.0000 mg | Freq: Once | INTRAMUSCULAR | Status: AC
  Administered 2017-08-23: 30 mg via INTRAVENOUS
  Filled 2017-08-23: qty 1

## 2017-08-23 MED ORDER — IBUPROFEN 800 MG PO TABS: 800.0000 mg | ORAL_TABLET | Freq: Three times a day (TID) | ORAL | 0 refills | Status: DC | PRN

## 2017-08-23 MED ORDER — HYDROMORPHONE HCL 1 MG/ML IJ SOLN
1.0000 mg | Freq: Once | INTRAMUSCULAR | Status: AC
  Administered 2017-08-23: 1 mg via INTRAVENOUS
  Filled 2017-08-23: qty 1

## 2017-08-23 MED ORDER — SODIUM CHLORIDE 0.9 % IV BOLUS (SEPSIS)
1000.0000 mL | Freq: Once | INTRAVENOUS | Status: AC
  Administered 2017-08-23: 1000 mL via INTRAVENOUS

## 2017-08-23 MED ORDER — METHOCARBAMOL 500 MG PO TABS: 500.0000 mg | ORAL_TABLET | Freq: Three times a day (TID) | ORAL | 0 refills | Status: DC | PRN

## 2017-12-24 ENCOUNTER — Emergency Department (HOSPITAL_COMMUNITY)
Admission: EM | Admit: 2017-12-24 | Discharge: 2017-12-24 | Disposition: A | Discharge status: Home / Self Care | Payer: Medicaid Other | Attending: Emergency Medicine | Admitting: Emergency Medicine

## 2017-12-24 ENCOUNTER — Encounter (HOSPITAL_COMMUNITY): Payer: Self-pay

## 2017-12-24 ENCOUNTER — Emergency Department (HOSPITAL_COMMUNITY): Payer: Medicaid Other

## 2017-12-24 DIAGNOSIS — F17210 Nicotine dependence, cigarettes, uncomplicated: Secondary | ICD-10-CM | POA: Diagnosis not present

## 2017-12-24 DIAGNOSIS — Z7984 Long term (current) use of oral hypoglycemic drugs: Secondary | ICD-10-CM | POA: Insufficient documentation

## 2017-12-24 DIAGNOSIS — Z79899 Other long term (current) drug therapy: Secondary | ICD-10-CM | POA: Diagnosis not present

## 2017-12-24 DIAGNOSIS — J449 Chronic obstructive pulmonary disease, unspecified: Secondary | ICD-10-CM | POA: Insufficient documentation

## 2017-12-24 DIAGNOSIS — M25532 Pain in left wrist: Secondary | ICD-10-CM | POA: Diagnosis present

## 2017-12-24 DIAGNOSIS — I10 Essential (primary) hypertension: Secondary | ICD-10-CM | POA: Diagnosis not present

## 2017-12-24 DIAGNOSIS — J45909 Unspecified asthma, uncomplicated: Secondary | ICD-10-CM | POA: Insufficient documentation

## 2017-12-24 DIAGNOSIS — E119 Type 2 diabetes mellitus without complications: Secondary | ICD-10-CM | POA: Diagnosis not present

## 2017-12-24 DIAGNOSIS — R079 Chest pain, unspecified: Secondary | ICD-10-CM | POA: Diagnosis not present

## 2017-12-24 DIAGNOSIS — M109 Gout, unspecified: Secondary | ICD-10-CM

## 2017-12-24 LAB — CBC
HEMATOCRIT: 44.1 % (ref 39.0–52.0)
HEMOGLOBIN: 14.7 g/dL (ref 13.0–17.0)
MCH: 30.4 pg (ref 26.0–34.0)
MCHC: 33.3 g/dL (ref 30.0–36.0)
MCV: 91.1 fL (ref 78.0–100.0)
Platelets: 419 10*3/uL — ABNORMAL HIGH (ref 150–400)
RBC: 4.84 MIL/uL (ref 4.22–5.81)
RDW: 16.2 % — ABNORMAL HIGH (ref 11.5–15.5)
WBC: 9.5 10*3/uL (ref 4.0–10.5)

## 2017-12-24 LAB — BASIC METABOLIC PANEL
ANION GAP: 15 (ref 5–15)
BUN: 13 mg/dL (ref 6–20)
CHLORIDE: 100 mmol/L — AB (ref 101–111)
CO2: 24 mmol/L (ref 22–32)
Calcium: 10.1 mg/dL (ref 8.9–10.3)
Creatinine, Ser: 1.1 mg/dL (ref 0.61–1.24)
GFR calc Af Amer: >60 mL/min (ref >60)
GLUCOSE: 132 mg/dL — AB (ref 65–99)
POTASSIUM: 4.5 mmol/L (ref 3.5–5.1)
Sodium: 139 mmol/L (ref 135–145)

## 2017-12-24 LAB — I-STAT TROPONIN, ED: Troponin i, poc: 0 ng/mL (ref 0.00–0.08)

## 2017-12-24 IMAGING — CR DG CHEST 2V
2 series · 2 of 2 positions shown · non-contrast
Comparison: [DATE]

CLINICAL DATA: Chest pain

EXAM:
CHEST  2 VIEW

[chest lat]
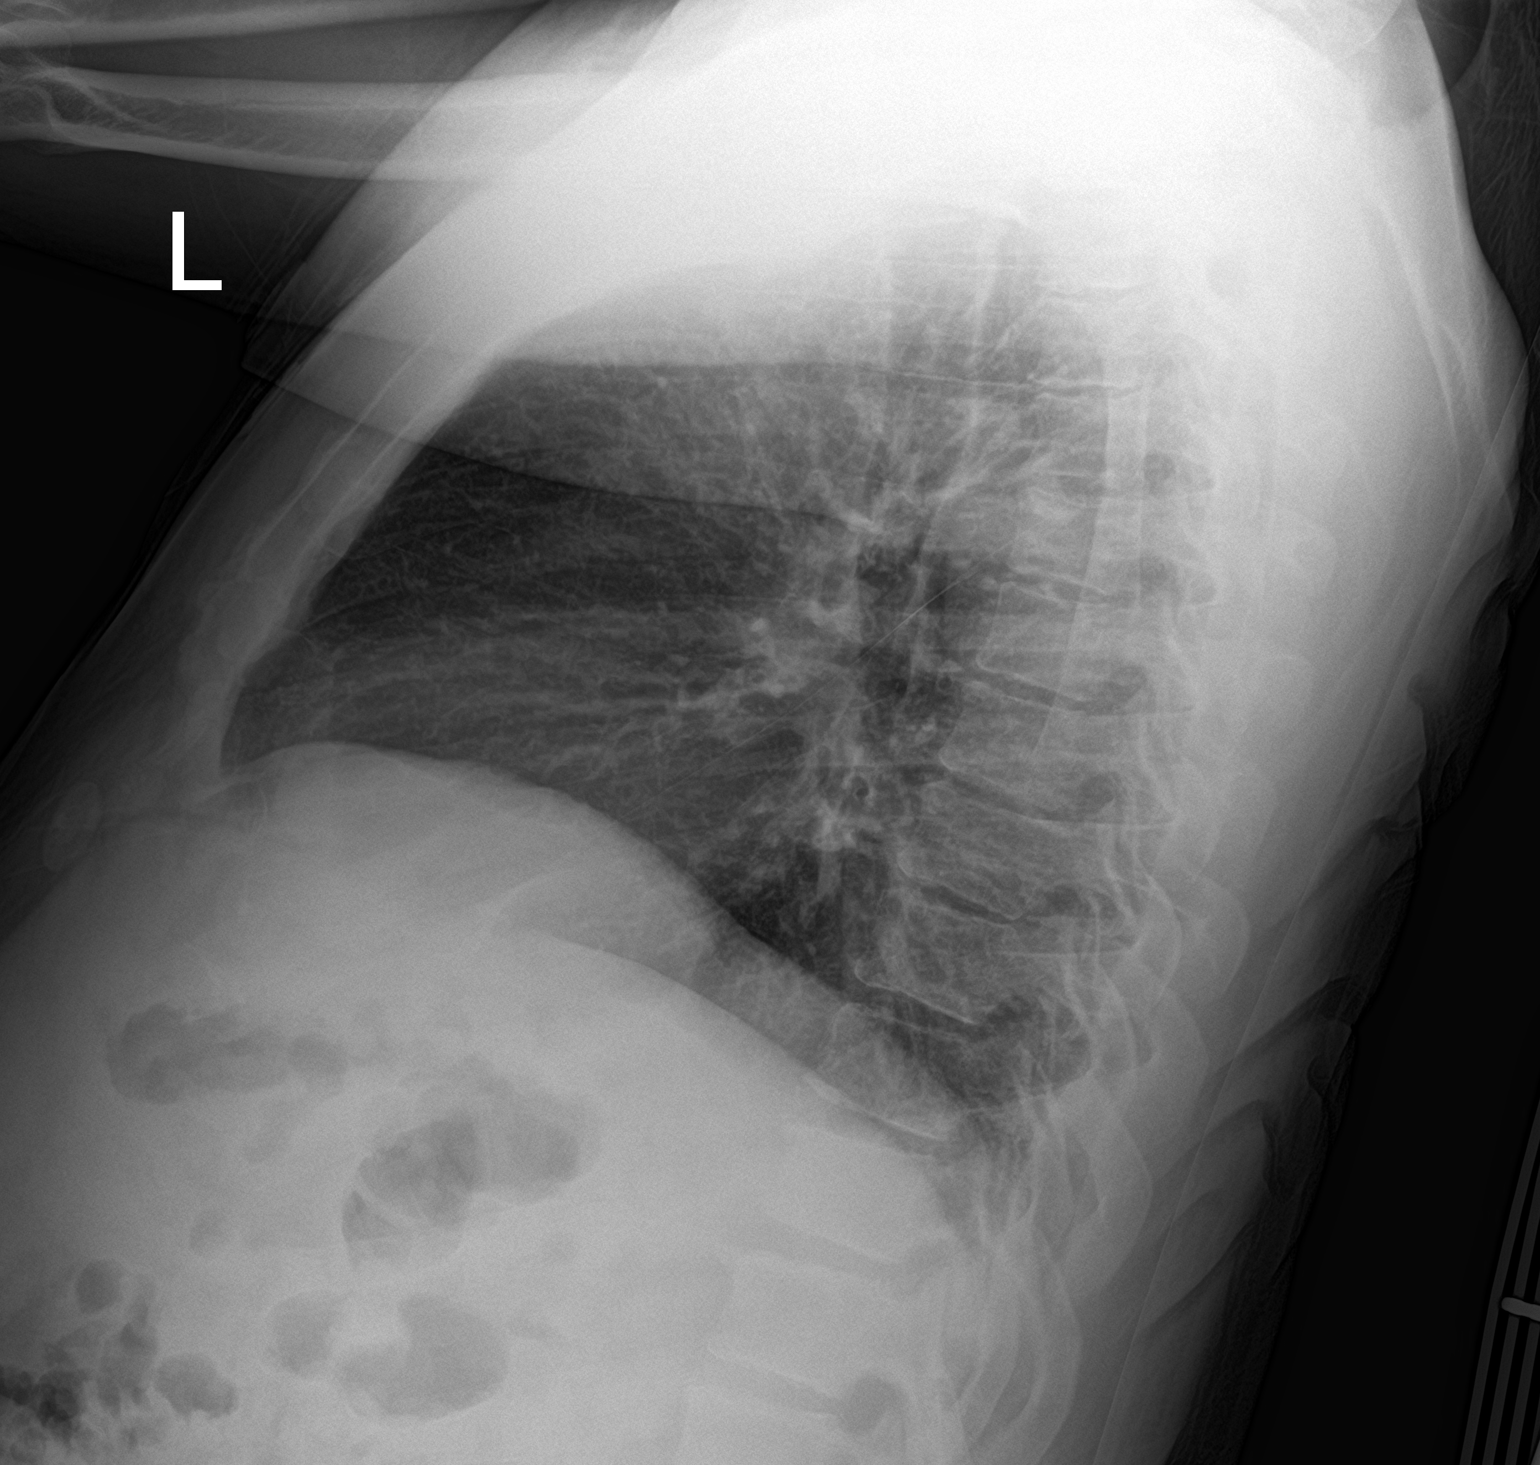

[chest ap]
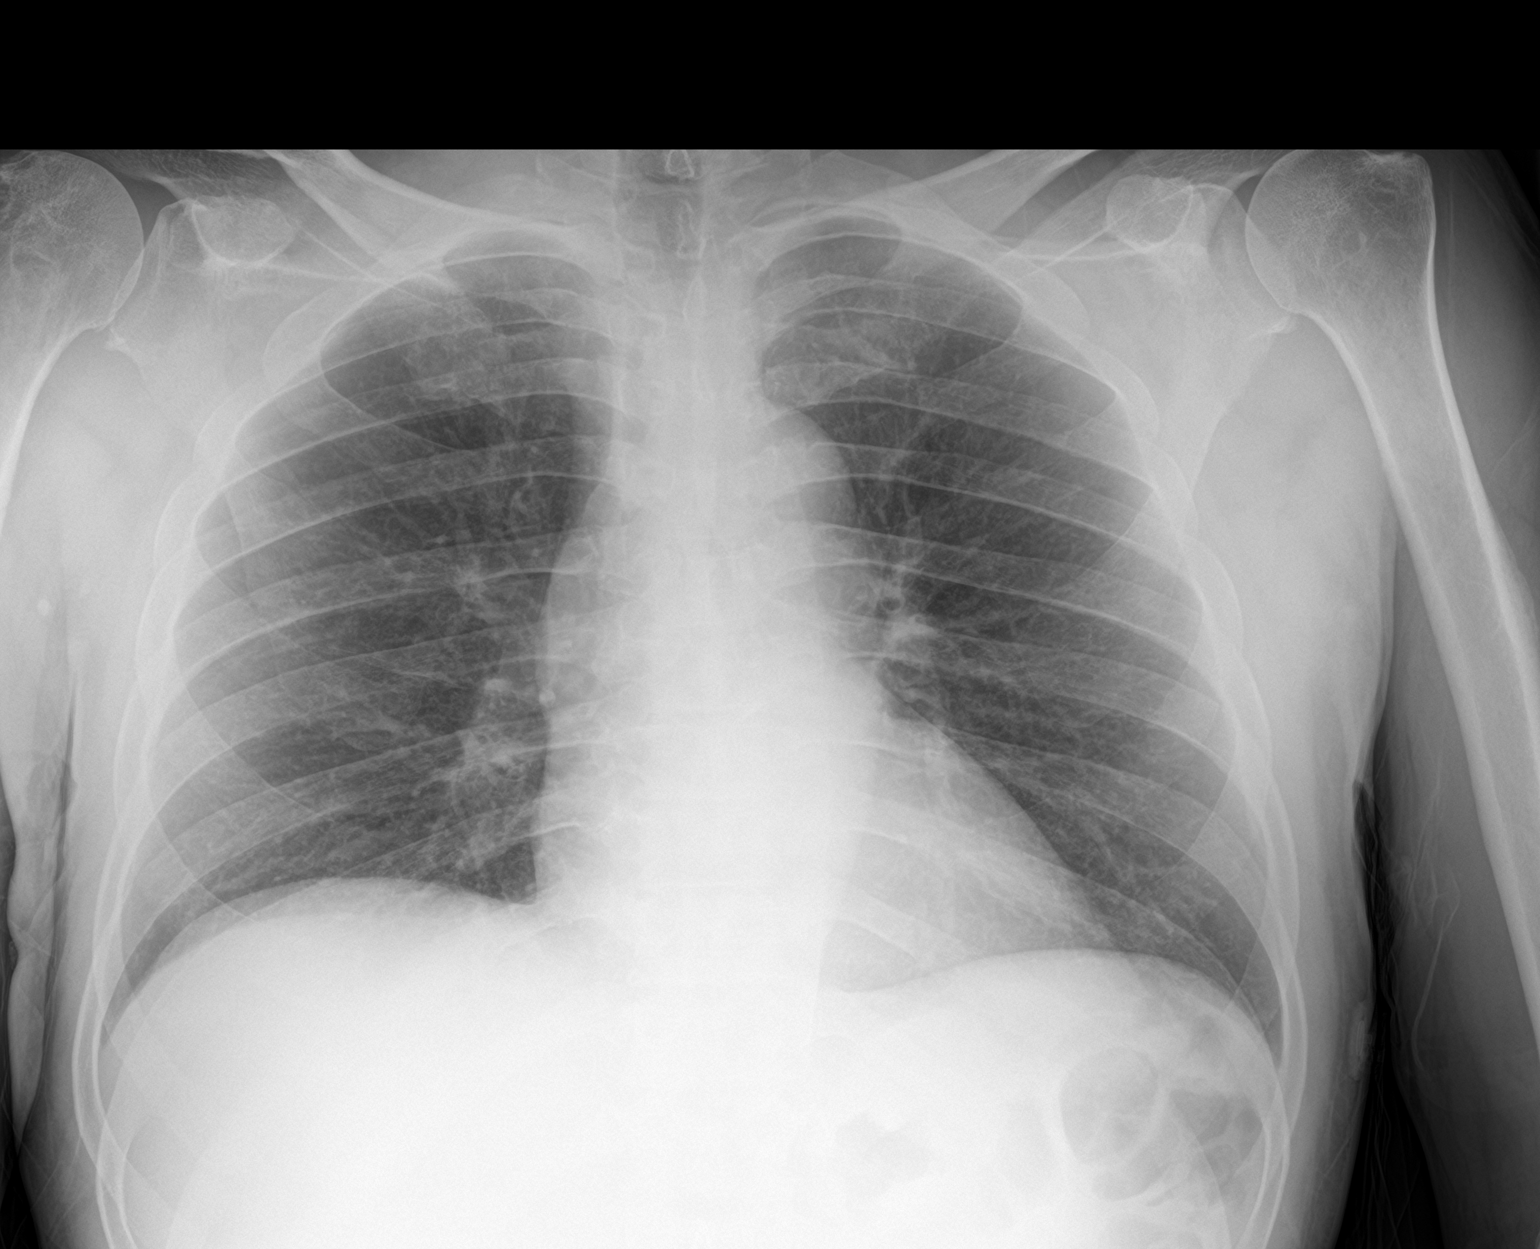

[2 of 2 positions shown; findings below may reference images not displayed]

FINDINGS: The heart size and mediastinal contours are within normal limits.
Both lungs are clear. The visualized skeletal structures are
unremarkable.
IMPRESSION: No active cardiopulmonary disease.

## 2017-12-24 MED ORDER — COLCHICINE 0.6 MG PO TABS
1.2000 mg | ORAL_TABLET | Freq: Once | ORAL | Status: AC
  Administered 2017-12-24: 1.2 mg via ORAL
  Filled 2017-12-24: qty 2

## 2017-12-24 MED ORDER — ATENOLOL 100 MG PO TABS: 100.0000 mg | ORAL_TABLET | Freq: Every day | ORAL | 0 refills | Status: DC

## 2017-12-24 MED ORDER — HYDROCODONE-ACETAMINOPHEN 5-325 MG PO TABS: 1.0000 | ORAL_TABLET | Freq: Four times a day (QID) | ORAL | 0 refills | Status: DC | PRN

## 2017-12-24 MED ORDER — KETOROLAC TROMETHAMINE 30 MG/ML IJ SOLN
30.0000 mg | Freq: Once | INTRAMUSCULAR | Status: AC
  Administered 2017-12-24: 30 mg via INTRAMUSCULAR
  Filled 2017-12-24: qty 1

## 2017-12-24 MED ORDER — ALBUTEROL SULFATE HFA 108 (90 BASE) MCG/ACT IN AERS
2.0000 | INHALATION_SPRAY | Freq: Once | RESPIRATORY_TRACT | Status: AC
  Administered 2017-12-24: 2 via RESPIRATORY_TRACT
  Filled 2017-12-24: qty 6.7

## 2017-12-24 MED ORDER — COLCHICINE 0.6 MG PO TABS: ORAL_TABLET | ORAL | 0 refills | Status: DC

## 2017-12-24 MED ORDER — LOSARTAN POTASSIUM 100 MG PO TABS: 100.0000 mg | ORAL_TABLET | Freq: Every day | ORAL | 0 refills | Status: DC

## 2017-12-24 MED ORDER — PREDNISONE 20 MG PO TABS: ORAL_TABLET | ORAL | 0 refills | Status: DC

## 2017-12-24 MED ORDER — PREDNISONE 20 MG PO TABS
60.0000 mg | ORAL_TABLET | Freq: Once | ORAL | Status: AC
  Administered 2017-12-24: 60 mg via ORAL
  Filled 2017-12-24: qty 3

## 2017-12-24 MED ORDER — COLCHICINE 0.6 MG PO TABS
0.6000 mg | ORAL_TABLET | Freq: Once | ORAL | Status: AC
  Administered 2017-12-24: 0.6 mg via ORAL
  Filled 2017-12-24: qty 1

## 2017-12-24 MED ORDER — NAPROXEN 500 MG PO TABS: 500.0000 mg | ORAL_TABLET | Freq: Two times a day (BID) | ORAL | 0 refills | Status: DC | PRN

## 2017-12-24 NOTE — ED Notes (Signed)
Patient verbalizes understanding of discharge instructions. Opportunity for questioning and answers were provided. Armband removed by staff, pt discharged from ED ambulatory.   

## 2017-12-24 NOTE — ED Triage Notes (Signed)
Patient complains of recurrent gout pain to left hand and right knee x 2 weeks. Also complains of SSCP with back pain for same. Pain worse with inspiration and movement.

## 2017-12-24 NOTE — Discharge Instructions (Addendum)
Your symptoms are likely related to gout. Take prednisone as directed, starting tomorrow since today's dose was given to you here in the ER today. Take colchicine as directed, starting tomorrow since today's dose was given to you here as well. Alternate between naprosyn and norco as directed as needed for pain, but don't drive or operate machinery while taking norco. DO NOT DRINK ALCOHOL as this can make gout worse! Follow the diet listed below to help avoid gout attacks.  Your blood pressure was elevated here today, eat a low salt diet and take your blood pressure medications as directed. Follow up with your regular doctor in 5-7 days for recheck of symptoms and ongoing management of your conditions. Return to the ER for emergent changes or worsening symptoms.

## 2017-12-24 NOTE — ED Provider Notes (Signed)
[facility] EMERGENCY DEPARTMENT Provider Note   CSN: [id number] Arrival date & time: 12/24/17  6962     History   Chief Complaint Chief Complaint  Patient presents with  . Chest Pain  . gout pain    HPI Darryl Hurley is a 57 y.o. male with a PMHx of alcoholism, COPD on 2L, chronic back pain, gout, DM2, HTN, and other conditions listed below, who presents to the ED with multiple complaints, but primarily complaining of left wrist and right knee pain which feel like his prior gout attacks.  He describes his pain as 10/10 constant sharp nonradiating pain in those areas, worse with movement of those areas, and on improved with colchicine.  He reports associated swelling, erythema, and warmth.  He also mentions having intermittent right-sided chest pain which is currently resolved.  He started having a cough with white sputum production this morning.  He is a tobacco smoker occasionally.  He admits that he drank alcohol prior to onset of his symptoms, but denies red meat or seafood consumption.  He is unsure of exactly how he has been taking his colchicine, and believes he is still taking his allopurinol but is not sure.  He denies fevers, chills, diaphoresis, lightheadedness, LE swelling, ongoing CP, SOB, abd pain, N/V/D/C, hematuria, dysuria, numbness, tingling, focal weakness, or any other complaints at this time.    The history is provided by the patient and medical records. No language interpreter was used.  Wrist Pain  This is a recurrent problem. The current episode started more than 2 days ago. The problem occurs constantly. The problem has not changed since onset.Associated symptoms include chest pain (none ongoing). Pertinent negatives include no abdominal pain and no shortness of breath. Exacerbated by: movement. Nothing relieves the symptoms. Treatments tried: colchicine. The treatment provided no relief.    Past Medical History:  Diagnosis Date  . Alcohol  abuse   . Angina pectoris   . Arthritis   . Asthma   . Chronic lower back pain   . COPD (chronic obstructive pulmonary disease) (HCC)    on home O2 2L  . Diabetes mellitus without complication (HCC)   . Exertional shortness of breath   . Gout   . Hypertension   . Seizures [facility])     Patient Active Problem List   Diagnosis Date Noted  . Lactic acidosis 01/06/2016  . Lactic acid acidosis 01/06/2016  . Alcohol abuse with intoxication (HCC) 01/06/2016  . COPD with acute exacerbation (HCC) 02/02/2014  . HCAP (healthcare-associated pneumonia) 02/02/2014  . Increased anion gap metabolic acidosis 02/02/2014  . Cocaine abuse (HCC) 11/18/2013  . Fever 11/17/2013  . Anemia 11/17/2013  . Hypokalemia 11/17/2013  . Polyarthritis 11/17/2013  . Dehydration 11/17/2013  . Type 2 diabetes mellitus (HCC) 05/14/2013  . Chronic obstructive pulmonary disease (COPD) (HCC) 05/12/2013  . Gout 05/12/2013  . Headache 05/12/2013  . Asthma 05/04/2013  . TOBACCO ABUSE 04/27/2009  . Uncontrolled hypertension 04/27/2009    Past Surgical History:  Procedure Laterality Date  . NO PAST SURGERIES         Home Medications    Prior to Admission medications   Medication Sig Start Date End Date Taking? Authorizing Provider  albuterol (PROVENTIL HFA;VENTOLIN HFA) 108 (90 BASE) MCG/ACT inhaler Inhale 1 puff into the lungs every 6 (six) hours as needed for wheezing or shortness of breath.    [provider]  allopurinol (ZYLOPRIM) 100 MG tablet Take 100 mg by mouth daily.  [provider]  atenolol (TENORMIN) 100 MG tablet Take 1 tablet (100 mg total) by mouth every morning. Reported on 01/06/2016 01/08/16   Alison Murray, MD  folic acid (FOLVITE) 1 MG tablet Take 1 tablet (1 mg total) by mouth daily. Patient not taking: Reported on 01/19/2017 01/08/16   Alison Murray, MD  ibuprofen (ADVIL,MOTRIN) 800 MG tablet Take 1 tablet (800 mg total) by mouth every 8 (eight) hours as needed for mild  pain. 08/23/17   Ward, Layla Maw, DO  losartan (COZAAR) 100 MG tablet Take 1 tablet (100 mg total) by mouth every morning. Reported on 01/06/2016 01/08/16   Alison Murray, MD  metFORMIN (GLUCOPHAGE) 1000 MG tablet Take 1 tablet (1,000 mg total) by mouth 2 (two) times daily with a meal. 01/08/16   Alison Murray, MD  methocarbamol (ROBAXIN) 500 MG tablet Take 1 tablet (500 mg total) by mouth every 8 (eight) hours as needed for muscle spasms. 08/23/17   Ward, Layla Maw, DO  rosuvastatin (CRESTOR) 40 MG tablet Take 40 mg by mouth daily.    [provider]  thiamine 100 MG tablet Take 1 tablet (100 mg total) by mouth daily. Patient not taking: Reported on 01/19/2017 01/08/16   Alison Murray, MD    Family History Family History  Problem Relation Age of Onset  . Diabetes type II Mother   . Depression Father   . Suicidality Father   . Asthma Brother     Social History Social History   Tobacco Use  . Smoking status: Current Every Day Smoker    Packs/day: 0.50    Years: 40.00    Pack years: 20.00    Types: Cigarettes  . Smokeless tobacco: Never Used  . Tobacco comment: 05/12/2013 'eased off smoking since last month"  Substance Use Topics  . Alcohol use: Yes    Alcohol/week: 0.0 oz    Comment: 4 40 oz beers per day  . Drug use: No     Allergies   Patient has no known allergies.   Review of Systems Review of Systems  Constitutional: Negative for chills, diaphoresis and fever.  Respiratory: Positive for cough. Negative for shortness of breath.   Cardiovascular: Positive for chest pain (none ongoing). Negative for leg swelling.  Gastrointestinal: Negative for abdominal pain, constipation, diarrhea, nausea and vomiting.  Genitourinary: Negative for dysuria and hematuria.  Musculoskeletal: Positive for arthralgias and joint swelling. Negative for myalgias.  Skin: Positive for color change.  Allergic/Immunologic: Positive for immunocompromised state (DM2).  Neurological: Negative  for weakness, light-headedness and numbness.  Psychiatric/Behavioral: Negative for confusion.   All other systems reviewed and are negative for acute change except as noted in the HPI.    Physical Exam Updated Vital Signs BP (!) 166/118 (BP Location: Right Arm)   Pulse 97   Temp (!) 97.5 F (36.4 C) (Oral)   Resp 16   SpO2 100%   Physical Exam  Constitutional: He is oriented to person, place, and time. Vital signs are normal. He appears well-developed and well-nourished.  Non-toxic appearance. No distress.  Afebrile, nontoxic, NAD  HENT:  Head: Normocephalic and atraumatic.  Mouth/Throat: Oropharynx is clear and moist and mucous membranes are normal.  Eyes: Conjunctivae and EOM are normal. Right eye exhibits no discharge. Left eye exhibits no discharge.  Neck: Normal range of motion. Neck supple.  Cardiovascular: Normal rate, regular rhythm, normal heart sounds and intact distal pulses. Exam reveals no gallop and no friction rub.  No  murmur heard. RRR, nl s1/s2, no m/r/g, distal pulses intact, no pedal edema   Pulmonary/Chest: Effort normal and breath sounds normal. No respiratory distress. He has no decreased breath sounds. He has no wheezes. He has no rhonchi. He has no rales. He exhibits no tenderness, no crepitus, no deformity and no retraction.  CTAB in all lung fields, no w/r/r, no hypoxia or increased WOB, speaking in full sentences, SpO2 100% on RA Chest wall nonTTP without crepitus, deformities, or retractions   Abdominal: Soft. Normal appearance and bowel sounds are normal. He exhibits no distension. There is no tenderness. There is no rigidity, no rebound, no guarding, no CVA tenderness, no tenderness at McBurney's point and negative Murphy's sign.  Musculoskeletal:       Left wrist: He exhibits decreased range of motion (due to pain), tenderness, bony tenderness and swelling. He exhibits no crepitus, no deformity and no laceration.       Right knee: He exhibits decreased  range of motion (due to pain), swelling, effusion and erythema. He exhibits no ecchymosis, no deformity, no laceration, normal alignment, no LCL laxity, normal patellar mobility and no MCL laxity. Tenderness found.  L wrist with limited ROM due to pain, with generalized TTP, no crepitus or deformity, no anatomical snuffbox tenderness, +swelling and mild erythema, +warmth, no bruising.  R knee with limited ROM due to pain, with diffuse joint line TTP, +swelling, no deformity, no bruising, faint erythema, +warmth, no abnormal alignment or patellar mobility, no varus/valgus laxity, neg anterior drawer test, no crepitus.  Strength and sensation grossly intact, distal pulses intact, compartments soft  Neg homan's, no pedal edema Gait steady with cane  Neurological: He is alert and oriented to person, place, and time. He has normal strength. No sensory deficit.  Skin: Skin is warm, dry and intact. No rash noted.  Psychiatric: He has a normal mood and affect.  Nursing note and vitals reviewed.    ED Treatments / Results  Labs (all labs ordered are listed, but only abnormal results are displayed) Labs Reviewed  BASIC METABOLIC PANEL - Abnormal; Notable for the following components:      Result Value   Chloride 100 (*)    Glucose, Bld 132 (*)    All other components within normal limits  CBC - Abnormal; Notable for the following components:   RDW 16.2 (*)    Platelets 419 (*)    All other components within normal limits  I-STAT TROPONIN, ED    EKG  EKG Interpretation  Date/Time:  Tuesday December 24 2017 09:36:24 EST Ventricular Rate:  91 PR Interval:  134 QRS Duration: 82 QT Interval:  342 QTC Calculation: 420 R Axis:   59 Text Interpretation:  Normal sinus rhythm Cannot rule out Anterior infarct , age undetermined Abnormal ECG Nonspecific T wave abnormality since last tracing no significant change Confirmed by Eber Hong ([id number]) on 12/24/2017 11:19:35 AM       Radiology Dg  Chest 2 View  Result Date: 12/24/2017 CLINICAL DATA:  Chest pain EXAM: CHEST  2 VIEW COMPARISON:  6/13/8 FINDINGS: The heart size and mediastinal contours are within normal limits. Both lungs are clear. The visualized skeletal structures are unremarkable. IMPRESSION: No active cardiopulmonary disease. Electronically Signed   By: Alcide Clever M.D.   On: 12/24/2017 10:25    Echo 07/07/09: Study Conclusions 1. Left ventricle: The cavity size was normal. Wall thickness was  increased in a pattern of mild LVH. Systolic function was normal.  The estimated ejection  fraction was in the range of 55% to 65%.  There was no dynamic obstruction. Wall motion was normal; there  were no regional wall motion abnormalities. No evidence of  thrombus. 2. Left atrium: The atrium was mildly dilated.   Procedures Procedures (including critical care time)  Medications Ordered in ED Medications  predniSONE (DELTASONE) tablet 60 mg (60 mg Oral Given 12/24/17 1258)  colchicine tablet 1.2 mg (1.2 mg Oral Given 12/24/17 1258)  ketorolac (TORADOL) 30 MG/ML injection 30 mg (30 mg Intramuscular Given 12/24/17 1258)  colchicine tablet 0.6 mg (0.6 mg Oral Given 12/24/17 1401)  albuterol (PROVENTIL HFA;VENTOLIN HFA) 108 (90 Base) MCG/ACT inhaler 2 puff (2 puffs Inhalation Given 12/24/17 1401)     Initial Impression / Assessment and Plan / ED Course  I have reviewed the triage vital signs and the nursing notes.  Pertinent labs & imaging results that were available during my care of the patient were reviewed by me and considered in my medical decision making (see chart for details).     57 y.o. male here with multiple complaints, but his primary complaint is R knee and L wrist pain related to his gout x1 wk. States he's been taking colchicine at home (unclear how he's been taking it) with no relief. On exam, L wrist and R knee with mild swelling, faint erythema and warmth, diffuse TTP, NVI with soft  compartments, consistent with gout. Doubt septic joint. He also c/o intermittent R sided CP that is currently resolved. Work up thus far reveals: BMP fairly unremarkable; CBC with mildly elevated plt 419 but otherwise WNL; EKG with nonspecific T wave changes but no significant changes since prior EKG; CXR neg; Trop pending. Will give prednisone, colchicine, and toradol, then reassess shortly.   2:01 PM Trop negative. Pt feeling better. Discussed diet modifications for gout, alcohol cessation, and will send home with colchicine 0.6mg  daily x5 days, prednisone x4 days starting tomorrow, naprosyn and norco for pain control, and discussed RICE. Of note, BP elevated here (191/111 but down to 163/107 prior to d/c), however pt currently asymptomatic; pt unsure of what meds he's supposed to be on, but based on admission from 2017 he was on atenolol 100mg  daily and cozaar 100mg  daily; will provide refills of these, advised DASH diet as well. He is also requesting an inhaler since he ran out of his. Advised f/up with PCP for recheck in 1wk. I explained the diagnosis and have given explicit precautions to return to the ER including for any other new or worsening symptoms. The patient understands and accepts the medical plan as it's been dictated and I have answered their questions. Discharge instructions concerning home care and prescriptions have been given. The patient is STABLE and is discharged to home in good condition.   [place] reviewed prior to dispensing controlled substance medications, and 2 year search was notable for: tramadol-APAP 37.5-325mg  #50 on 11/06/16 and 09/18/16. no other controlled substances found. Risks/benefits/alternatives and expectations discussed regarding controlled substances. Side effects of medications discussed. Informed consent obtained.    Final Clinical Impressions(s) / ED Diagnoses   Final diagnoses:  Acute gout of multiple sites, unspecified cause  Intermittent chest  pain  Essential hypertension    ED Discharge Orders        Ordered    atenolol (TENORMIN) 100 MG tablet  Daily     12/24/17 1310    losartan (COZAAR) 100 MG tablet  Daily     12/24/17 1310    predniSONE (DELTASONE)  20 MG tablet     12/24/17 1310    HYDROcodone-acetaminophen (NORCO) 5-325 MG tablet  Every 6 hours PRN     12/24/17 1310    naproxen (NAPROSYN) 500 MG tablet  2 times daily PRN     12/24/17 1310    colchicine 0.6 MG tablet     12/24/17 [id number][id number]       Joeangel Jeanpaul, [place], [place]PA-C 12/24/17 1402    Eber HongMiller, Brian, MD 12/24/17 [phone]

## 2017-12-24 NOTE — ED Provider Notes (Signed)
The patient is a 57 year old male with a known history of gouty arthritis as well as a history of hypertension, history of diabetes, he states that he is not compliant with his medications but does take them occasionally however recently he has been taking a green pill regularly to try to help with his gout pain but states it is not helping.  He has had worsening pain in his left hand and wrist as well as his right knee and on clinical exam he does not fact have swelling in both of these areas with a clinical effusion of the right knee and decreased range of motion.  He has normal heart and lung sounds, he is noted to be significantly hypertensive and again states that he does not take his medicines regularly but does report having something at home though he cannot recall the name.  He states that he sees a family doctor on [address] though he cannot remember the name.  The patient will need to be treated for his hypertension, medications will be filled for him based on a prior discharge summary from 2 years ago assuming that he is still taking his medications.  The patient cannot recall the name of the medicines he is currently taking nor can he tell me the pharmacy get them filled out.  He will also be given some medication for his gout, likely a course of prednisone would be helpful, as well as 10 tablets of Vicodin.  He Artie has what appears to be an anti-inflammatory possibly colchicine at home.  Patient was informed of the indications for return and expressed his understanding.  He does not have any complaints of chest pain for me at this time and his heart and lung exam was unremarkable.   EKG Interpretation  Date/Time:  Tuesday December 24 2017 09:36:24 EST Ventricular Rate:  91 PR Interval:  134 QRS Duration: 82 QT Interval:  342 QTC Calculation: 420 R Axis:   59 Text Interpretation:  Normal sinus rhythm Cannot rule out Anterior infarct , age undetermined Abnormal ECG Nonspecific T wave  abnormality since last tracing no significant change Confirmed by Eber HongMiller, Zaelyn Noack ([id number]) on 12/24/2017 11:19:35 AM       Medical screening examination/treatment/procedure(s) were conducted as a shared visit with non-physician practitioner(s) and myself.  I personally evaluated the patient during the encounter.  Clinical Impression:   Final diagnoses:  None         Eber HongMiller, Terrelle Ruffolo, MD 12/24/17 [phone]

## 2017-12-24 NOTE — ED Notes (Signed)
Patient ambulatory to bathroom with steady gait at this time 

## 2017-12-24 NOTE — ED Notes (Signed)
Gave pt Malaw[place]key sandwich,  applesauce & Sprite, per Dr. Hyacinth MeekerMiller.

## 2018-04-26 ENCOUNTER — Emergency Department (HOSPITAL_COMMUNITY): Payer: Medicaid Other

## 2018-04-26 ENCOUNTER — Emergency Department (HOSPITAL_COMMUNITY)
Admission: EM | Admit: 2018-04-26 | Discharge: 2018-04-26 | Disposition: A | Discharge status: Home / Self Care | Payer: Medicaid Other | Attending: Emergency Medicine | Admitting: Emergency Medicine

## 2018-04-26 ENCOUNTER — Encounter (HOSPITAL_COMMUNITY): Payer: Self-pay | Admitting: Emergency Medicine

## 2018-04-26 DIAGNOSIS — Z7984 Long term (current) use of oral hypoglycemic drugs: Secondary | ICD-10-CM | POA: Diagnosis not present

## 2018-04-26 DIAGNOSIS — E119 Type 2 diabetes mellitus without complications: Secondary | ICD-10-CM | POA: Insufficient documentation

## 2018-04-26 DIAGNOSIS — I10 Essential (primary) hypertension: Secondary | ICD-10-CM | POA: Insufficient documentation

## 2018-04-26 DIAGNOSIS — F17210 Nicotine dependence, cigarettes, uncomplicated: Secondary | ICD-10-CM | POA: Diagnosis not present

## 2018-04-26 DIAGNOSIS — M25512 Pain in left shoulder: Secondary | ICD-10-CM

## 2018-04-26 DIAGNOSIS — Z79899 Other long term (current) drug therapy: Secondary | ICD-10-CM | POA: Diagnosis not present

## 2018-04-26 DIAGNOSIS — M7582 Other shoulder lesions, left shoulder: Secondary | ICD-10-CM | POA: Diagnosis not present

## 2018-04-26 DIAGNOSIS — J449 Chronic obstructive pulmonary disease, unspecified: Secondary | ICD-10-CM | POA: Insufficient documentation

## 2018-04-26 IMAGING — DX DG SHOULDER 2+V*L*
2 series · 2 of 2 positions shown · non-contrast
Comparison: [DATE].

CLINICAL DATA: Anterior posterior left shoulder pain for the past 2
days. No known injury.

EXAM:
LEFT SHOULDER - 2+ VIEW

[shoulder grashey]
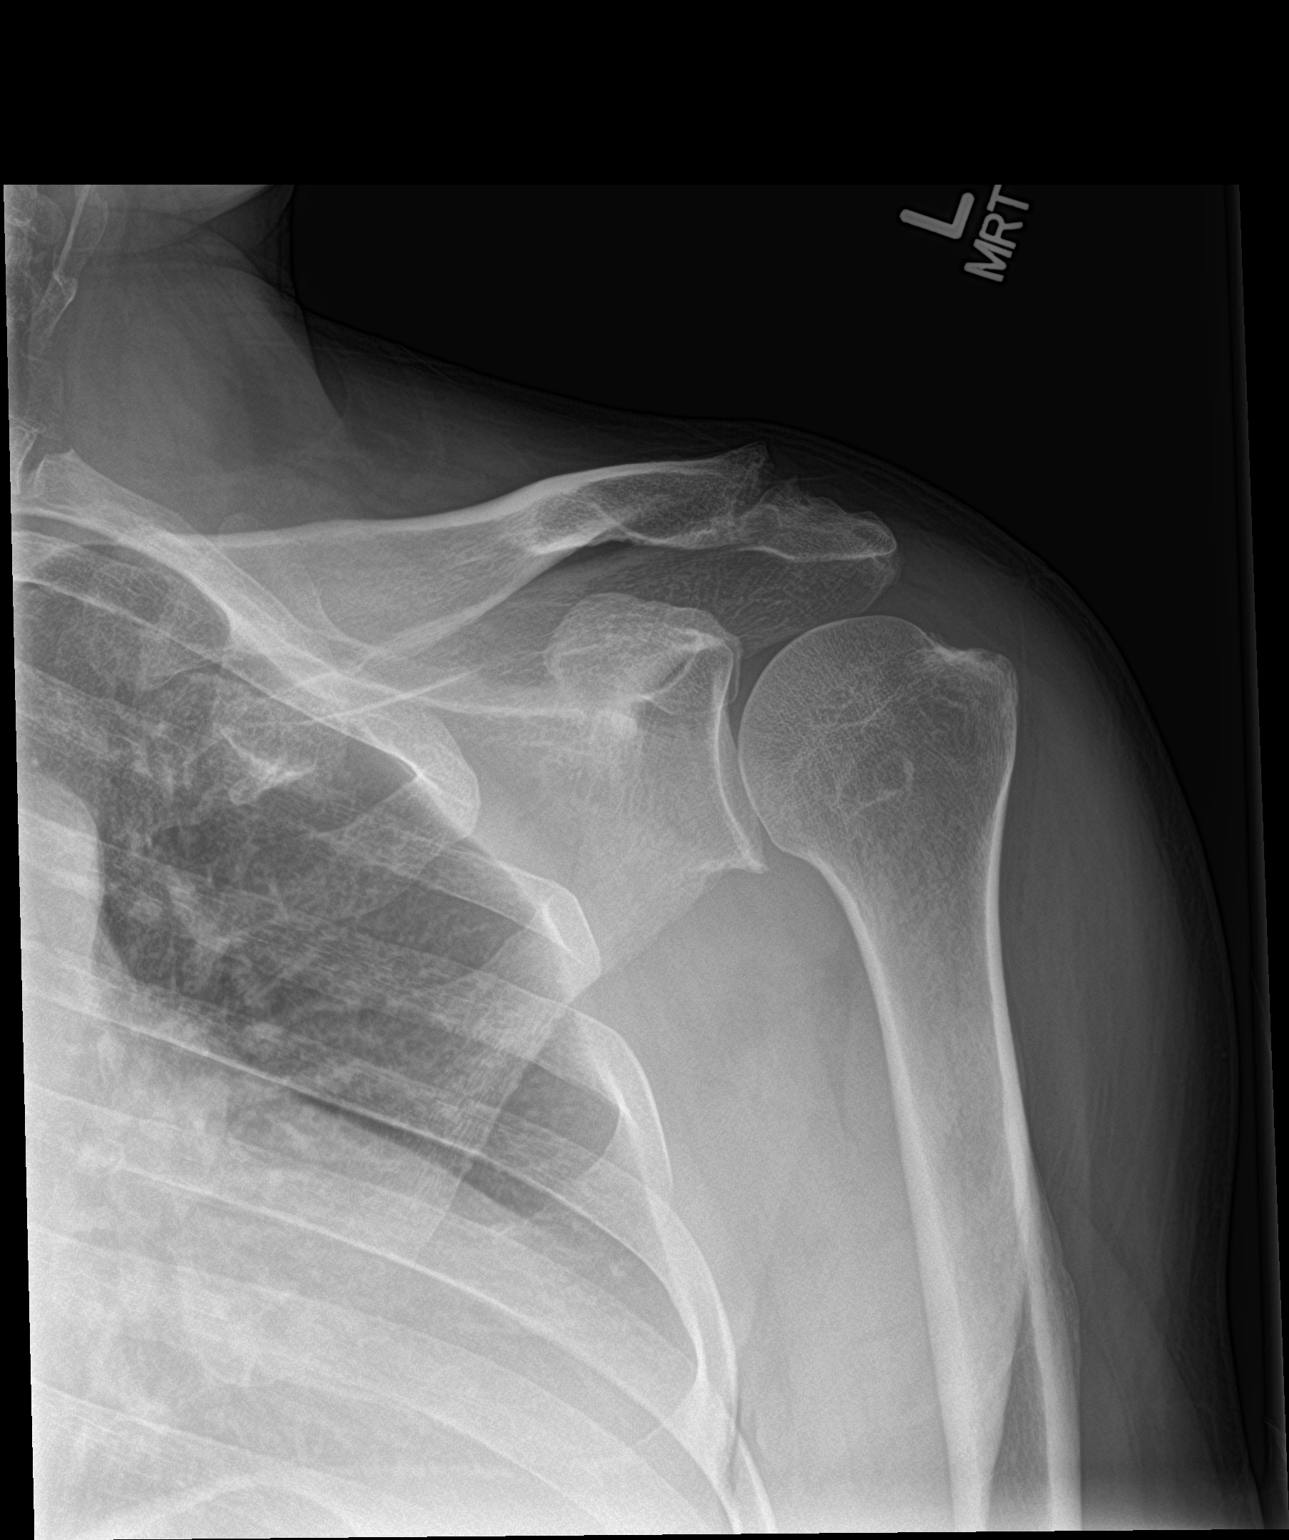

[shoulder y view]
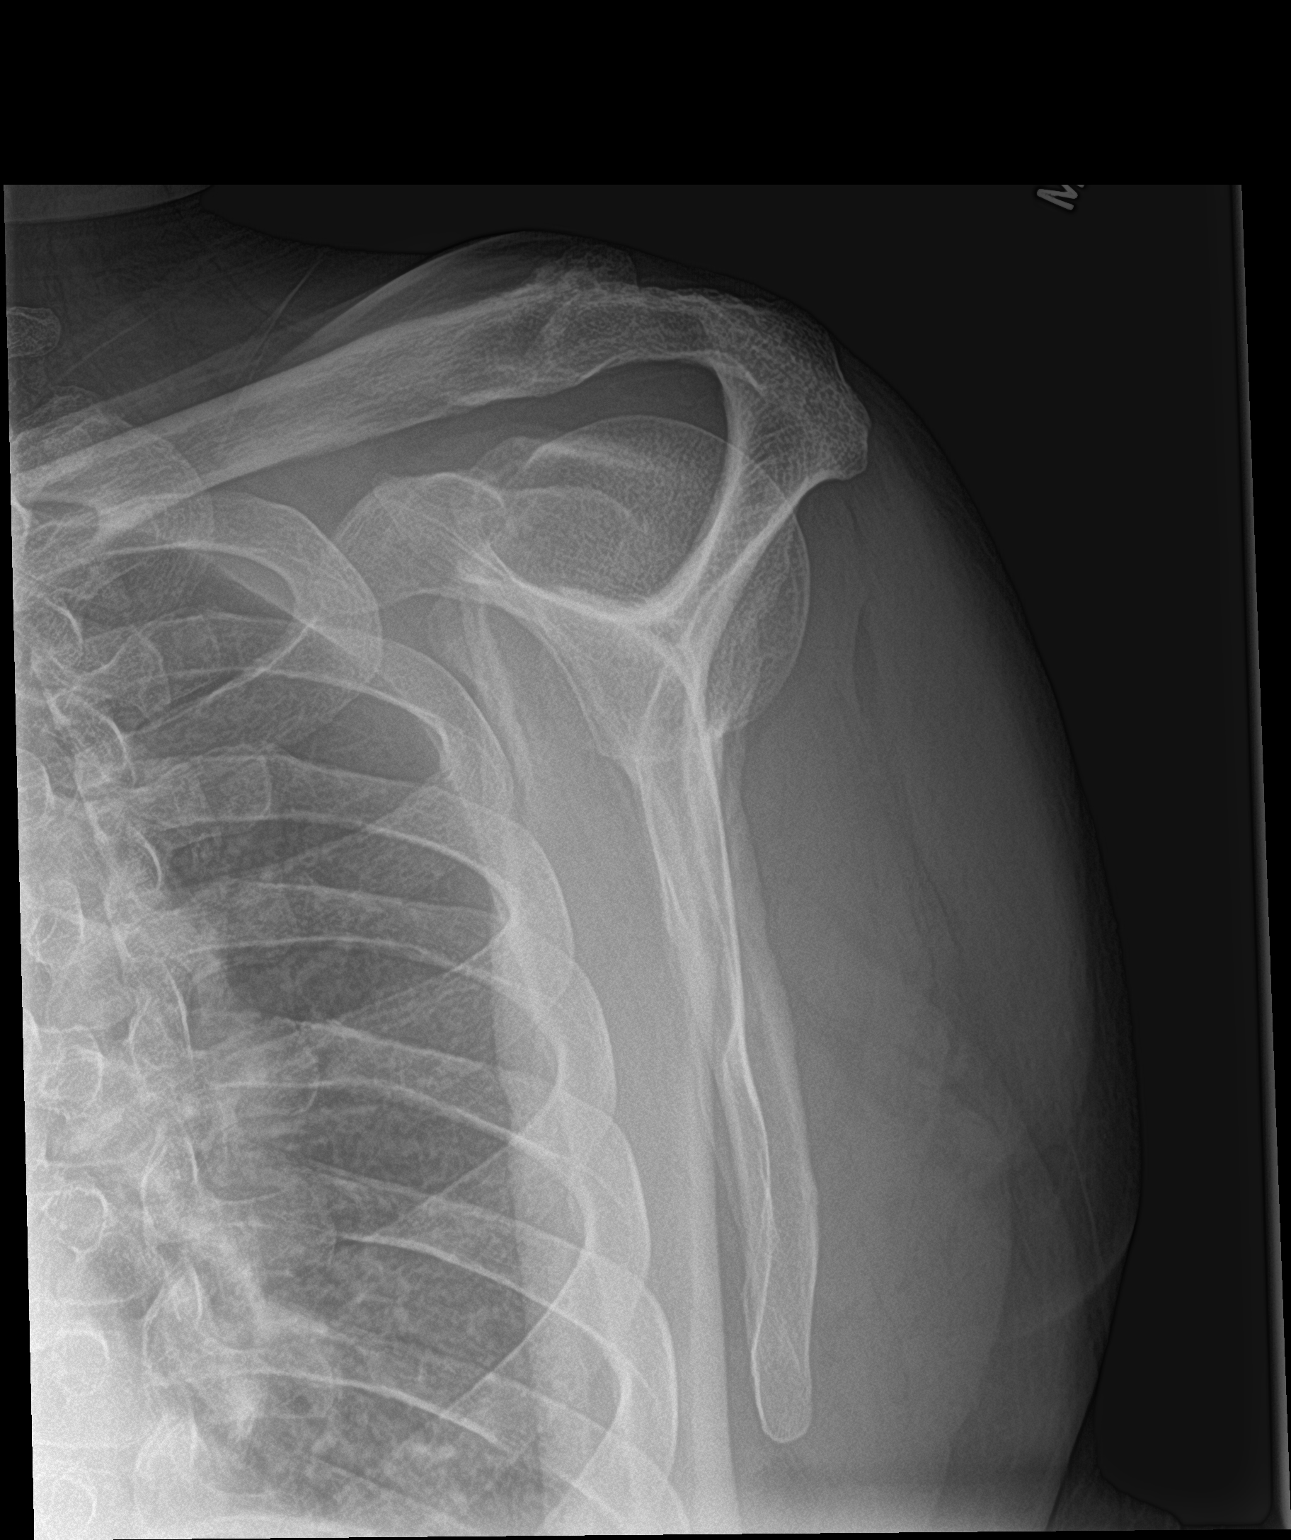

[2 of 2 positions shown; findings below may reference images not displayed]

FINDINGS: Mild inferior glenohumeral spur formation. Moderate superior
acromioclavicular spur formation. Mild greater tuberosity
hyperostosis and sclerosis. No fracture or dislocation seen.
IMPRESSION: Degenerative changes, described above.

## 2018-04-26 MED ORDER — CYCLOBENZAPRINE HCL 10 MG PO TABS: 10.0000 mg | ORAL_TABLET | Freq: Two times a day (BID) | ORAL | 0 refills | Status: DC | PRN

## 2018-04-26 MED ORDER — CYCLOBENZAPRINE HCL 10 MG PO TABS
10.0000 mg | ORAL_TABLET | Freq: Once | ORAL | Status: AC
  Administered 2018-04-26: 10 mg via ORAL
  Filled 2018-04-26: qty 1

## 2018-04-26 MED ORDER — IBUPROFEN 800 MG PO TABS
800.0000 mg | ORAL_TABLET | Freq: Once | ORAL | Status: AC
Start: 2018-04-26 — End: 2018-04-26
  Administered 2018-04-26: 800 mg via ORAL
  Filled 2018-04-26: qty 1

## 2018-04-26 MED ORDER — MELOXICAM 15 MG PO TABS: 15.0000 mg | ORAL_TABLET | Freq: Every day | ORAL | 0 refills | Status: DC

## 2018-04-26 NOTE — ED Provider Notes (Addendum)
[facility] EMERGENCY DEPARTMENT Provider Note   CSN: [id number] Arrival date & time: 04/26/18  1016     History   Chief Complaint Chief Complaint  Patient presents with  . Shoulder Pain    HPI Darryl Hurley is a 57 y.o. male with a history of alcohol abuse, asthma, arthritis, chronic low back pain, diabetes mellitus, gout, HTN who presents to the emergency department with a chief complaint of left shoulder pain.  The patient endorses sudden onset, constant, gradually worsening left shoulder pain that began yesterday after he lifted a large piece of brush while performing yard work.  He reports that he has been able to move the arm due to the pain.  He also states that his entire left upper extremity feels numb.  He denies weakness, fever, chills, right upper extremity pain, neck, or acute back pain.  No treatment prior to arrival.  No history of left shoulder surgery.  The history is provided by the patient. No language interpreter was used.  Shoulder Pain   This is a new problem. The current episode started yesterday. The problem occurs constantly. The problem has been gradually worsening. The pain is present in the left shoulder. Associated symptoms include numbness and limited range of motion. Pertinent negatives include no stiffness, no tingling and no itching. The symptoms are aggravated by activity.    Past Medical History:  Diagnosis Date  . Alcohol abuse   . Angina pectoris   . Arthritis   . Asthma   . Chronic lower back pain   . COPD (chronic obstructive pulmonary disease) (HCC)    on home O2 2L  . Diabetes mellitus without complication (HCC)   . Exertional shortness of breath   . Gout   . Hypertension   . Seizures [facility])     Patient Active Problem List   Diagnosis Date Noted  . Lactic acidosis 01/06/2016  . Lactic acid acidosis 01/06/2016  . Alcohol abuse with intoxication (HCC) 01/06/2016  . COPD with acute exacerbation (HCC) 02/02/2014    . HCAP (healthcare-associated pneumonia) 02/02/2014  . Increased anion gap metabolic acidosis 02/02/2014  . Cocaine abuse (HCC) 11/18/2013  . Fever 11/17/2013  . Anemia 11/17/2013  . Hypokalemia 11/17/2013  . Polyarthritis 11/17/2013  . Dehydration 11/17/2013  . Type 2 diabetes mellitus (HCC) 05/14/2013  . Chronic obstructive pulmonary disease (COPD) (HCC) 05/12/2013  . Gout 05/12/2013  . Headache 05/12/2013  . Asthma 05/04/2013  . TOBACCO ABUSE 04/27/2009  . Uncontrolled hypertension 04/27/2009    Past Surgical History:  Procedure Laterality Date  . NO PAST SURGERIES          Home Medications    Prior to Admission medications   Medication Sig Start Date End Date Taking? Authorizing Provider  albuterol (PROVENTIL HFA;VENTOLIN HFA) 108 (90 BASE) MCG/ACT inhaler Inhale 1 puff into the lungs every 6 (six) hours as needed for wheezing or shortness of breath.    [provider]  allopurinol (ZYLOPRIM) 100 MG tablet Take 100 mg by mouth daily.    [provider]  atenolol (TENORMIN) 100 MG tablet Take 1 tablet (100 mg total) by mouth every morning. Reported on 01/06/2016 01/08/16   Alison Murray, MD  atenolol (TENORMIN) 100 MG tablet Take 1 tablet (100 mg total) by mouth daily. 12/24/17   Street, [place], PA-C  colchicine 0.6 MG tablet Take one tablet by mouth every morning for 5 days beginning 12/25/17 12/24/17   Street, [place], PA-C  cyclobenzaprine (FLEXERIL)  10 MG tablet Take 1 tablet (10 mg total) by mouth 2 (two) times daily as needed for muscle spasms. 04/26/18   McDonald, Mia A, PA-C  folic acid (FOLVITE) 1 MG tablet Take 1 tablet (1 mg total) by mouth daily. 01/08/16   Alison Murray, MD  HYDROcodone-acetaminophen [facility]) 5-325 MG tablet Take 1 tablet by mouth every 6 (six) hours as needed for severe pain. 12/24/17   Street, [place], PA-C  ibuprofen (ADVIL,MOTRIN) 800 MG tablet Take 1 tablet (800 mg total) by mouth every 8 (eight) hours as needed for mild pain.  08/23/17   Ward, Layla Maw, DO  losartan (COZAAR) 100 MG tablet Take 1 tablet (100 mg total) by mouth every morning. Reported on 01/06/2016 01/08/16   Alison Murray, MD  losartan (COZAAR) 100 MG tablet Take 1 tablet (100 mg total) by mouth daily. 12/24/17   Street, [place], PA-C  meloxicam (MOBIC) 15 MG tablet Take 1 tablet (15 mg total) by mouth daily. 04/26/18   McDonald, Mia A, PA-C  metFORMIN (GLUCOPHAGE) 1000 MG tablet Take 1 tablet (1,000 mg total) by mouth 2 (two) times daily with a meal. 01/08/16   Alison Murray, MD  methocarbamol (ROBAXIN) 500 MG tablet Take 1 tablet (500 mg total) by mouth every 8 (eight) hours as needed for muscle spasms. 08/23/17   Ward, Layla Maw, DO  naproxen (NAPROSYN) 500 MG tablet Take 1 tablet (500 mg total) by mouth 2 (two) times daily as needed for mild pain, moderate pain or headache (TAKE WITH MEALS.). 12/24/17   Street, [place], PA-C  predniSONE (DELTASONE) 20 MG tablet 3 tabs po daily x 4 days STARTING 12/25/17 12/24/17   Street, [place], PA-C  rosuvastatin (CRESTOR) 40 MG tablet Take 40 mg by mouth daily.    [provider]  thiamine 100 MG tablet Take 1 tablet (100 mg total) by mouth daily. 01/08/16   Alison Murray, MD    Family History Family History  Problem Relation Age of Onset  . Diabetes type II Mother   . Depression Father   . Suicidality Father   . Asthma Brother     Social History Social History   Tobacco Use  . Smoking status: Current Every Day Smoker    Packs/day: 0.50    Years: 40.00    Pack years: 20.00    Types: Cigarettes  . Smokeless tobacco: Never Used  . Tobacco comment: 05/12/2013 'eased off smoking since last month"  Substance Use Topics  . Alcohol use: Yes    Alcohol/week: 0.0 oz    Comment: 4 40 oz beers per day  . Drug use: No    Types: "Crack" cocaine     Allergies   Patient has no known allergies.   Review of Systems Review of Systems  Constitutional: Negative for activity change, chills and fever.    Respiratory: Negative for shortness of breath.   Cardiovascular: Negative for chest pain.  Gastrointestinal: Negative for abdominal pain, diarrhea, nausea and vomiting.  Musculoskeletal: Positive for arthralgias and myalgias. Negative for back pain, gait problem, joint swelling, neck pain and stiffness.  Skin: Negative for itching and rash.  Neurological: Positive for numbness. Negative for tingling and weakness.   Physical Exam Updated Vital Signs BP (!) 160/100 (BP Location: Right Arm)   Pulse 80   Temp 97.8 F (36.6 C) (Oral)   Resp 16   SpO2 100%   Physical Exam  Constitutional: He appears well-developed.  HENT:  Head: Normocephalic.  Eyes: Conjunctivae  are normal.  Neck: Neck supple.  Cardiovascular: Normal rate, regular rhythm, normal heart sounds and intact distal pulses. Exam reveals no gallop and no friction rub.  No murmur heard. Pulmonary/Chest: Effort normal and breath sounds normal. No stridor. No respiratory distress. He has no wheezes. He has no rales. He exhibits no tenderness.  Abdominal: Soft. He exhibits no distension.  Musculoskeletal: He exhibits tenderness. He exhibits no edema or deformity.  Full passive range of motion of the left shoulder, but it is painful.  Decreased active range of motion secondary to pain.  Full active and passive range of motion of the left elbow and shoulder.  No tenderness to palpation to the spinous processes of the cervical, thoracic, lumbar spine.  Bilateral paraspinal cervical muscles are nontender.  Radial pulses are 2+ and symmetric.  Patient endorsed numbness of the left upper extremity, but flinched in response to sharp touch.  Neurological: He is alert.  Skin: Skin is warm and dry.  Psychiatric: His behavior is normal.  Nursing note and vitals reviewed.    ED Treatments / Results  Labs (all labs ordered are listed, but only abnormal results are displayed) Labs Reviewed - No data to display  EKG None  Radiology Dg  Shoulder Left  Result Date: 04/26/2018 CLINICAL DATA:  Anterior posterior left shoulder pain for the past 2 days. No known injury. EXAM: LEFT SHOULDER - 2+ VIEW COMPARISON:  01/11/2012. FINDINGS: Mild inferior glenohumeral spur formation. Moderate superior acromioclavicular spur formation. Mild greater tuberosity hyperostosis and sclerosis. No fracture or dislocation seen. IMPRESSION: Degenerative changes, described above. Electronically Signed   By: Beckie Salts M.D.   On: 04/26/2018 12:59    Procedures Procedures (including critical care time)  Medications Ordered in ED Medications  cyclobenzaprine (FLEXERIL) tablet 10 mg (10 mg Oral Given 04/26/18 1339)  ibuprofen (ADVIL,MOTRIN) tablet 800 mg (800 mg Oral Given 04/26/18 1339)     Initial Impression / Assessment and Plan / ED Course  I have reviewed the triage vital signs and the nursing notes.  Pertinent labs & imaging results that were available during my care of the patient were reviewed by me and considered in my medical decision making (see chart for details).     Patient X-Ray negative for obvious fracture or dislocation.  Discussed glenohumeral and acromioclavicular spur formation as noted on x-ray with the patient.  Pain managed in ED. Pt advised to follow up with orthopedics if symptoms persist. Patient given brace while in ED, conservative therapy recommended and discussed. Patient will be dc home & is agreeable with above plan.  Final Clinical Impressions(s) / ED Diagnoses   Final diagnoses:  Acute pain of left shoulder  Bone spur of acromioclavicular joint, left    ED Discharge Orders        Ordered    cyclobenzaprine (FLEXERIL) 10 MG tablet  2 times daily PRN     04/26/18 1336    meloxicam (MOBIC) 15 MG tablet  Daily     04/26/18 1336        McDonald, Coral Else, PA-C 04/26/18 1811    Nira Conn, MD 04/27/18 [phone]

## 2018-04-26 NOTE — Discharge Instructions (Signed)
Thank you for allowing me to provide your care today in the emergency department.  Wear the sling on your left shoulder until your pain improves.  Apply ice for 15 to 20 minutes up to 3-4 times per day.  Take 1 tablet of meloxicam with food daily for pain.  Please avoid taking other medications such as ibuprofen, naproxen, or other NSAIDs.  You may also take at thousand milligrams of Tylenol once every 8 hours for pain.  Take 1 tablet of Flexeril every 12 hours as needed for muscle pain and spasms.  Start to perform exercises of your left shoulder as your pain allows to avoid stiffness.  If your pain does not significantly improve within the next week, you can call Dr. Aundria Rud, an orthopedist, or follow-up with your primary care provider for a recheck.  Return to the emergency department if you have another fall or injury, if the fingertips turn blue, or develop any other concerning symptoms.

## 2018-04-26 NOTE — ED Triage Notes (Addendum)
Patient complains of left shoulder pain after cutting down a large bush. Range of motion limited due to pain. No apparent deformity.

## 2018-06-11 ENCOUNTER — Other Ambulatory Visit: Payer: Self-pay

## 2018-06-11 ENCOUNTER — Encounter (HOSPITAL_COMMUNITY): Payer: Self-pay

## 2018-06-11 ENCOUNTER — Emergency Department (HOSPITAL_COMMUNITY): Payer: Medicaid Other

## 2018-06-11 ENCOUNTER — Emergency Department (HOSPITAL_COMMUNITY)
Admission: EM | Admit: 2018-06-11 | Discharge: 2018-06-11 | Disposition: A | Discharge status: Home / Self Care | Payer: Medicaid Other | Attending: Emergency Medicine | Admitting: Emergency Medicine

## 2018-06-11 DIAGNOSIS — Z79899 Other long term (current) drug therapy: Secondary | ICD-10-CM | POA: Diagnosis not present

## 2018-06-11 DIAGNOSIS — Z7984 Long term (current) use of oral hypoglycemic drugs: Secondary | ICD-10-CM | POA: Diagnosis not present

## 2018-06-11 DIAGNOSIS — M545 Low back pain, unspecified: Secondary | ICD-10-CM

## 2018-06-11 DIAGNOSIS — E119 Type 2 diabetes mellitus without complications: Secondary | ICD-10-CM | POA: Diagnosis not present

## 2018-06-11 DIAGNOSIS — F17210 Nicotine dependence, cigarettes, uncomplicated: Secondary | ICD-10-CM | POA: Insufficient documentation

## 2018-06-11 DIAGNOSIS — I10 Essential (primary) hypertension: Secondary | ICD-10-CM | POA: Diagnosis not present

## 2018-06-11 DIAGNOSIS — J449 Chronic obstructive pulmonary disease, unspecified: Secondary | ICD-10-CM | POA: Diagnosis not present

## 2018-06-11 LAB — BASIC METABOLIC PANEL
Anion gap: 12 (ref 5–15)
BUN: 24 mg/dL — AB (ref 6–20)
CHLORIDE: 101 mmol/L (ref 98–111)
CO2: 24 mmol/L (ref 22–32)
CREATININE: 1.24 mg/dL (ref 0.61–1.24)
Calcium: 10.5 mg/dL — ABNORMAL HIGH (ref 8.9–10.3)
GFR calc Af Amer: >60 mL/min (ref >60)
GFR calc non Af Amer: >60 mL/min (ref >60)
Glucose, Bld: 141 mg/dL — ABNORMAL HIGH (ref 70–99)
POTASSIUM: 3.9 mmol/L (ref 3.5–5.1)
Sodium: 137 mmol/L (ref 135–145)

## 2018-06-11 LAB — URINALYSIS, ROUTINE W REFLEX MICROSCOPIC
BILIRUBIN URINE: NEGATIVE
Bacteria, UA: NONE SEEN
GLUCOSE, UA: NEGATIVE mg/dL
HGB URINE DIPSTICK: NEGATIVE
Ketones, ur: NEGATIVE mg/dL
LEUKOCYTES UA: NEGATIVE
NITRITE: NEGATIVE
PH: 5 (ref 5.0–8.0)
Protein, ur: 30 mg/dL — AB
SPECIFIC GRAVITY, URINE: 1.018 (ref 1.005–1.030)

## 2018-06-11 LAB — CBC
HEMATOCRIT: 42.8 % (ref 39.0–52.0)
Hemoglobin: 14.1 g/dL (ref 13.0–17.0)
MCH: 29.7 pg (ref 26.0–34.0)
MCHC: 32.9 g/dL (ref 30.0–36.0)
MCV: 90.1 fL (ref 78.0–100.0)
PLATELETS: 459 10*3/uL — AB (ref 150–400)
RBC: 4.75 MIL/uL (ref 4.22–5.81)
RDW: 14.6 % (ref 11.5–15.5)
WBC: 9.6 10*3/uL (ref 4.0–10.5)

## 2018-06-11 IMAGING — DX DG LUMBAR SPINE COMPLETE 4+V
5 series · 5 of 5 positions shown · non-contrast
Comparison: Limited correlation made with chest radiographs
[DATE].

CLINICAL DATA: Recent onset of acute low back pain. Difficulty
moving and bending back.

EXAM:
LUMBAR SPINE - COMPLETE 4+ VIEW

[l-spine ap]
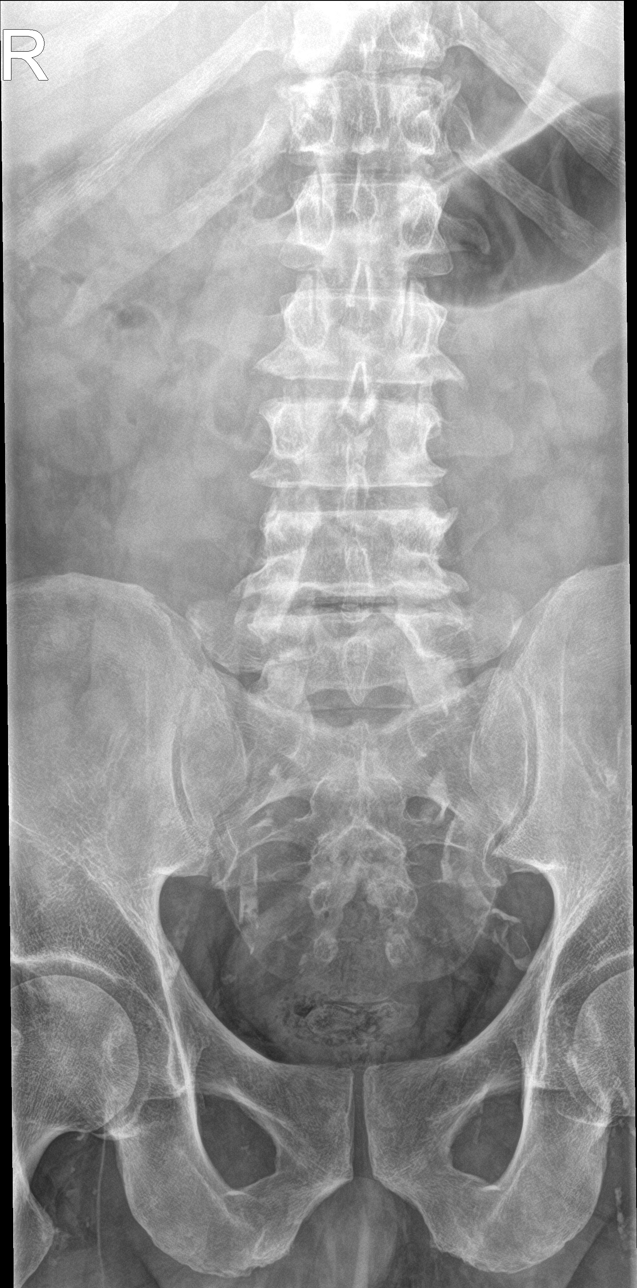

[l-spine obl (1 of 2)]
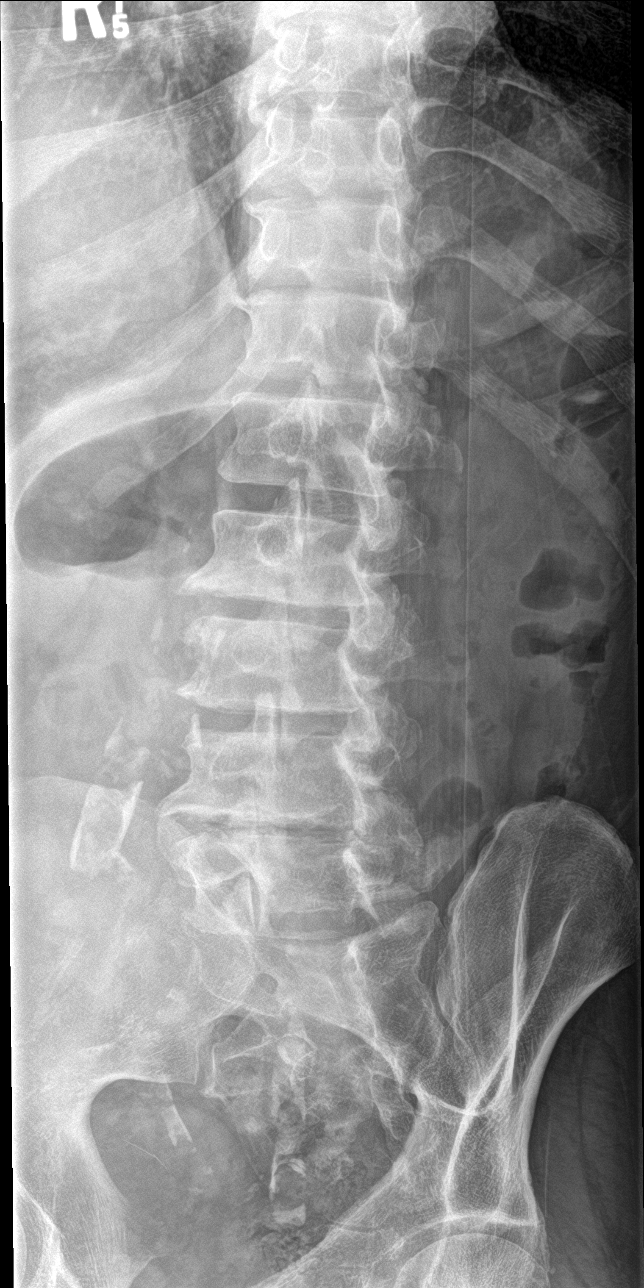

[l-spine obl (2 of 2)]
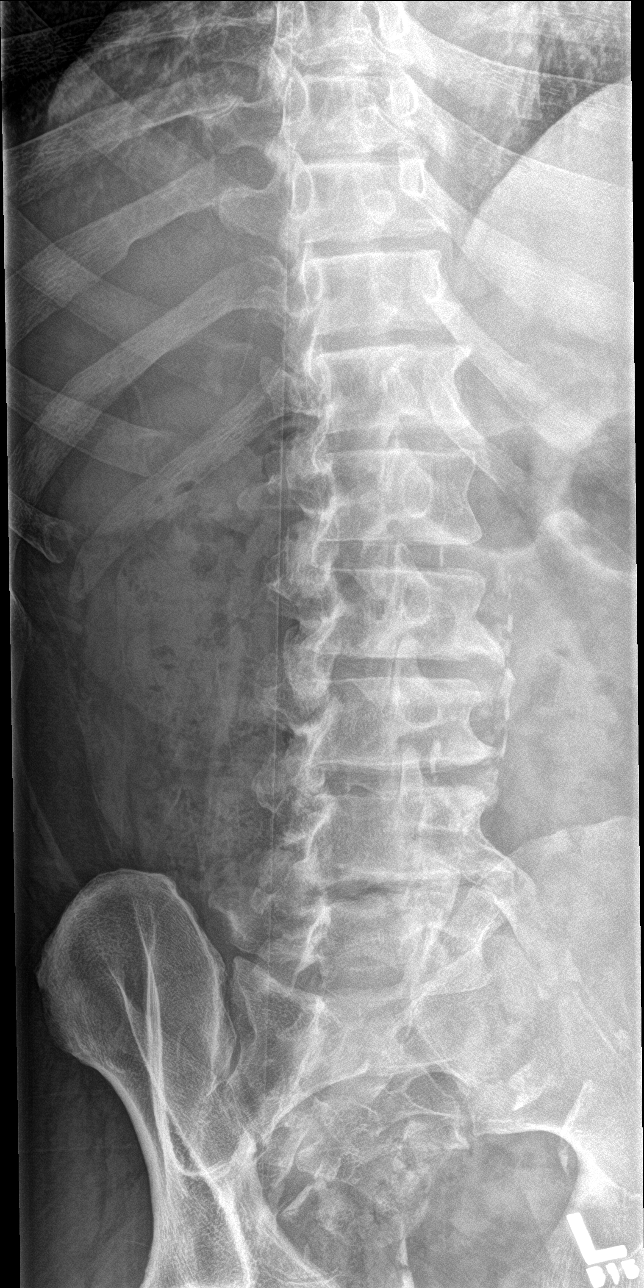

[l-spine lat]
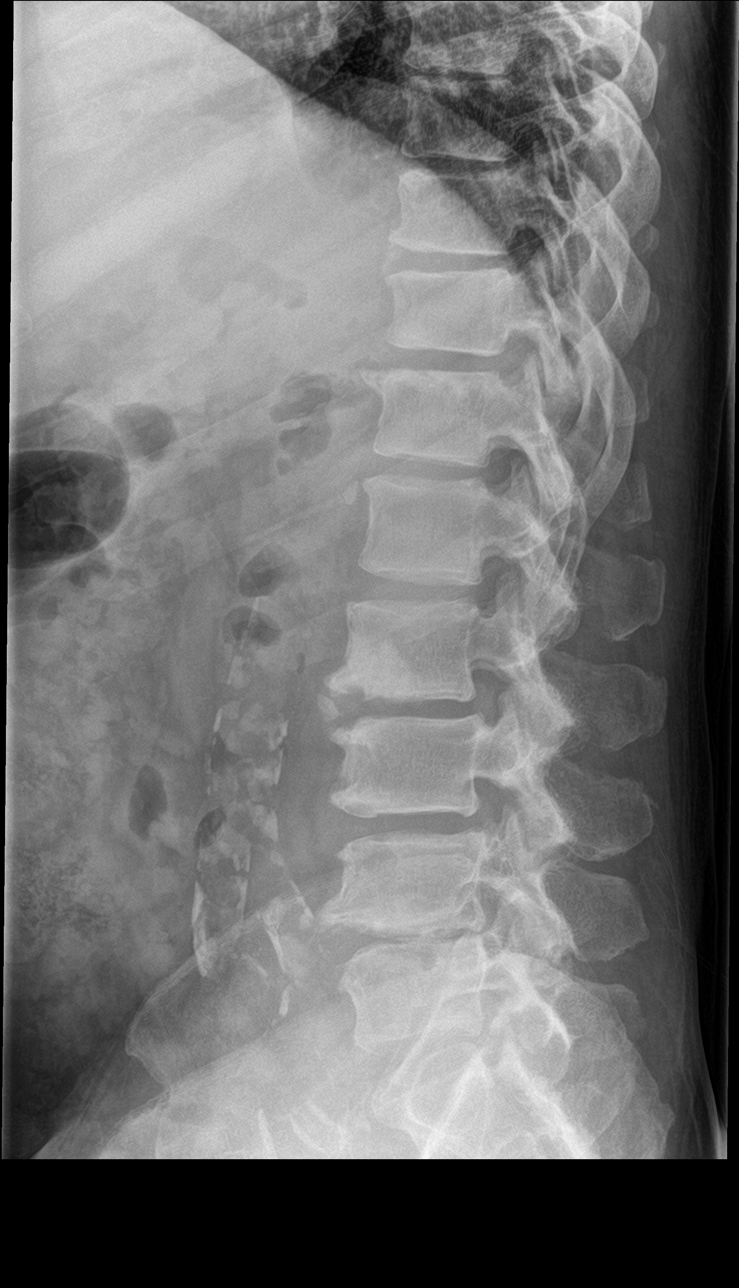

[l-spine spot]
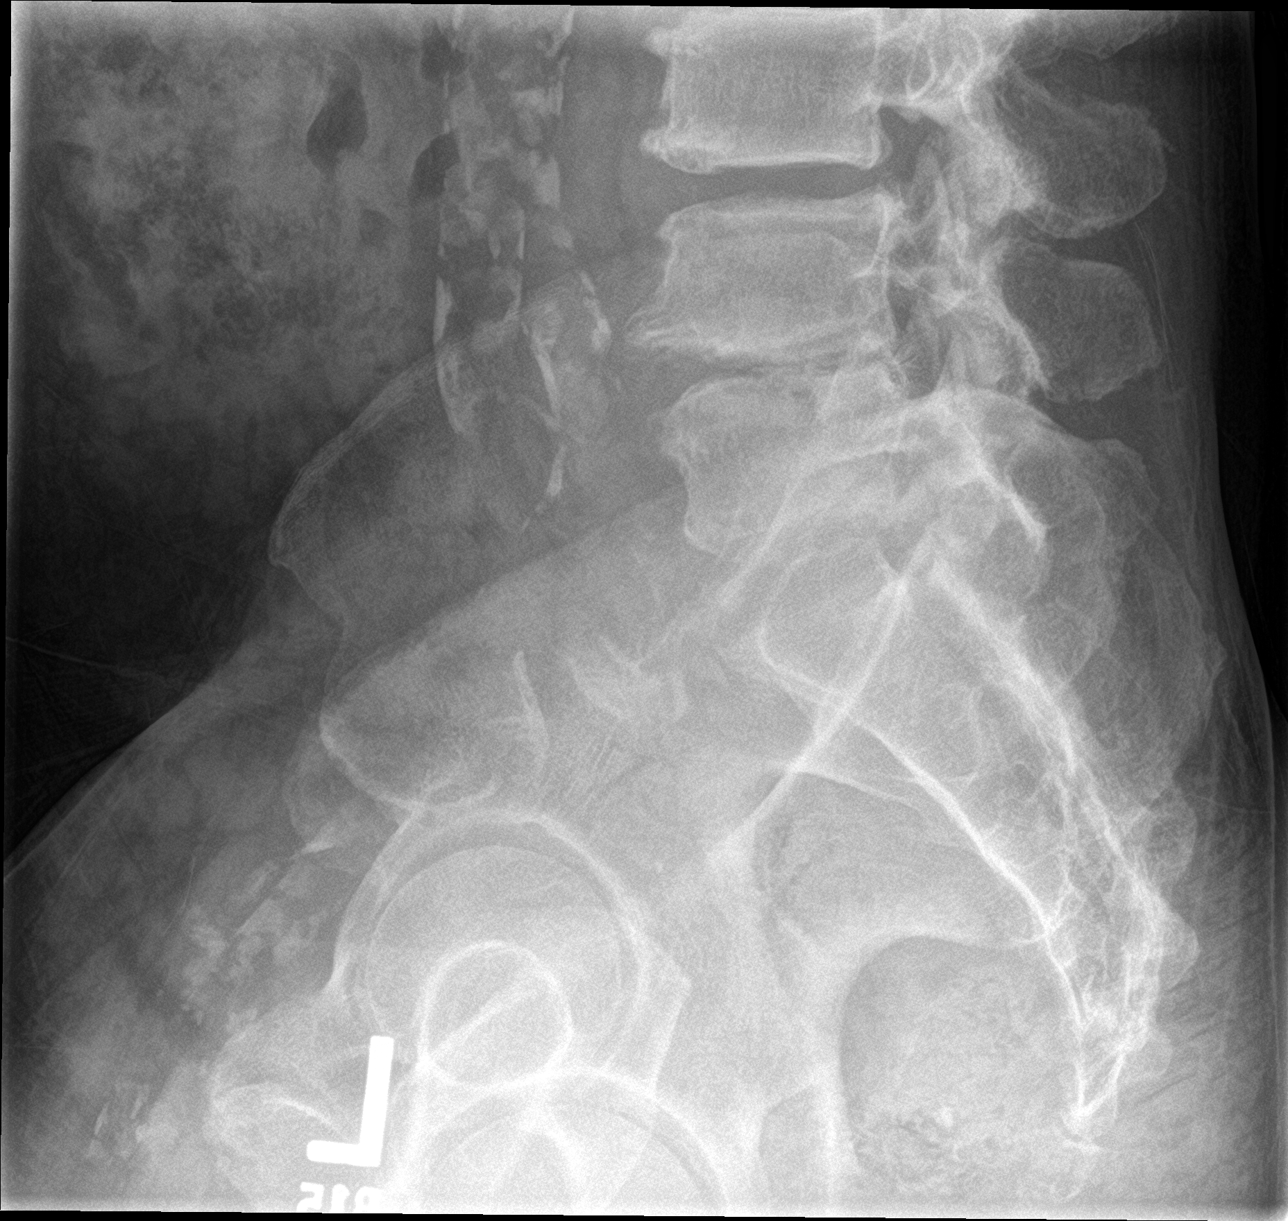

[5 of 5 positions shown; findings below may reference images not displayed]

FINDINGS: Five lumbar type vertebral bodies. There is straightening of the
usual lumbar lordosis with a minimal anterolisthesis at L2-3 and
L3-4. There is multilevel spondylosis with disc space narrowing,
endplate osteophytes and sclerosis, greatest at L2-3 and L4-5. Mild
facet degenerative changes are present. No evidence of acute
fracture or pars defect. There is age advanced aortoiliac
atherosclerosis.
IMPRESSION: 1. Multilevel lumbar spondylosis without acute osseous findings or
significant malalignment.
2. Age advanced aortoiliac atherosclerosis.

## 2018-06-11 MED ORDER — IBUPROFEN 800 MG PO TABS
800.0000 mg | ORAL_TABLET | Freq: Once | ORAL | Status: AC
  Administered 2018-06-11: 800 mg via ORAL
  Filled 2018-06-11: qty 1

## 2018-06-11 NOTE — Discharge Instructions (Addendum)
Please read attached information. If you experience any new or worsening signs or symptoms please return to the emergency room for evaluation. Please follow-up with your primary care provider or specialist as discussed.  °

## 2018-06-11 NOTE — ED Notes (Signed)
Patient able to ambulate independently  

## 2018-06-11 NOTE — ED Triage Notes (Signed)
Pt endorses right sided flank pain since yesterday. Worse with your ambulation and movement. Denies urinary sx or kidney issues.

## 2018-06-11 NOTE — ED Provider Notes (Signed)
[facility] EMERGENCY DEPARTMENT Provider Note   CSN: [id number] Arrival date & time: 06/11/18  1022     History   Chief Complaint Chief Complaint  Patient presents with  . Flank Pain    HPI Darryl Hurley is a 57 y.o. male.  HPI   57 year old male presents today with complaints of right lower back pain. Patient notes her last several weeks she's had progressively worsening right lower pain. Patient denies any radiation of symptoms into his lower extremity, denies any abdominal pain dysuria, fever, or any other red flags. Patient notes taking prescribed medication for pain but is uncertain what these are. Patient notes no significant improvement in his symptoms. Denies any trauma to his back.  Past Medical History:  Diagnosis Date  . Alcohol abuse   . Angina pectoris   . Arthritis   . Asthma   . Chronic lower back pain   . COPD (chronic obstructive pulmonary disease) (HCC)    on home O2 2L  . Diabetes mellitus without complication (HCC)   . Exertional shortness of breath   . Gout   . Hypertension   . Seizures [facility])     Patient Active Problem List   Diagnosis Date Noted  . Lactic acidosis 01/06/2016  . Lactic acid acidosis 01/06/2016  . Alcohol abuse with intoxication (HCC) 01/06/2016  . COPD with acute exacerbation (HCC) 02/02/2014  . HCAP (healthcare-associated pneumonia) 02/02/2014  . Increased anion gap metabolic acidosis 02/02/2014  . Cocaine abuse (HCC) 11/18/2013  . Fever 11/17/2013  . Anemia 11/17/2013  . Hypokalemia 11/17/2013  . Polyarthritis 11/17/2013  . Dehydration 11/17/2013  . Type 2 diabetes mellitus (HCC) 05/14/2013  . Chronic obstructive pulmonary disease (COPD) (HCC) 05/12/2013  . Gout 05/12/2013  . Headache 05/12/2013  . Asthma 05/04/2013  . TOBACCO ABUSE 04/27/2009  . Uncontrolled hypertension 04/27/2009    Past Surgical History:  Procedure Laterality Date  . NO PAST SURGERIES          Home Medications     Prior to Admission medications   Medication Sig Start Date End Date Taking? Authorizing Provider  albuterol (PROVENTIL HFA;VENTOLIN HFA) 108 (90 BASE) MCG/ACT inhaler Inhale 1 puff into the lungs every 6 (six) hours as needed for wheezing or shortness of breath.    [provider]  allopurinol (ZYLOPRIM) 100 MG tablet Take 100 mg by mouth daily.    [provider]  atenolol (TENORMIN) 100 MG tablet Take 1 tablet (100 mg total) by mouth every morning. Reported on 01/06/2016 01/08/16   Alison Murray, MD  atenolol (TENORMIN) 100 MG tablet Take 1 tablet (100 mg total) by mouth daily. 12/24/17   Street, [place], PA-C  colchicine 0.6 MG tablet Take one tablet by mouth every morning for 5 days beginning 12/25/17 12/24/17   Street, [place], PA-C  cyclobenzaprine (FLEXERIL) 10 MG tablet Take 1 tablet (10 mg total) by mouth 2 (two) times daily as needed for muscle spasms. 04/26/18   McDonald, Mia A, PA-C  folic acid (FOLVITE) 1 MG tablet Take 1 tablet (1 mg total) by mouth daily. 01/08/16   Alison Murray, MD  HYDROcodone-acetaminophen [facility]) 5-325 MG tablet Take 1 tablet by mouth every 6 (six) hours as needed for severe pain. 12/24/17   Street, [place], PA-C  ibuprofen (ADVIL,MOTRIN) 800 MG tablet Take 1 tablet (800 mg total) by mouth every 8 (eight) hours as needed for mild pain. 08/23/17   Ward, Layla Maw, DO  losartan (COZAAR) 100 MG  tablet Take 1 tablet (100 mg total) by mouth every morning. Reported on 01/06/2016 01/08/16   Alison Murray, MD  losartan (COZAAR) 100 MG tablet Take 1 tablet (100 mg total) by mouth daily. 12/24/17   Street, [place], PA-C  meloxicam (MOBIC) 15 MG tablet Take 1 tablet (15 mg total) by mouth daily. 04/26/18   McDonald, Mia A, PA-C  metFORMIN (GLUCOPHAGE) 1000 MG tablet Take 1 tablet (1,000 mg total) by mouth 2 (two) times daily with a meal. 01/08/16   Alison Murray, MD  methocarbamol (ROBAXIN) 500 MG tablet Take 1 tablet (500 mg total) by mouth every 8 (eight) hours as  needed for muscle spasms. 08/23/17   Ward, Layla Maw, DO  naproxen (NAPROSYN) 500 MG tablet Take 1 tablet (500 mg total) by mouth 2 (two) times daily as needed for mild pain, moderate pain or headache (TAKE WITH MEALS.). 12/24/17   Street, [place], PA-C  predniSONE (DELTASONE) 20 MG tablet 3 tabs po daily x 4 days STARTING 12/25/17 12/24/17   Street, [place], PA-C  rosuvastatin (CRESTOR) 40 MG tablet Take 40 mg by mouth daily.    [provider]  thiamine 100 MG tablet Take 1 tablet (100 mg total) by mouth daily. 01/08/16   Alison Murray, MD    Family History Family History  Problem Relation Age of Onset  . Diabetes type II Mother   . Depression Father   . Suicidality Father   . Asthma Brother     Social History Social History   Tobacco Use  . Smoking status: Current Every Day Smoker    Packs/day: 0.50    Years: 40.00    Pack years: 20.00    Types: Cigarettes  . Smokeless tobacco: Never Used  . Tobacco comment: 05/12/2013 'eased off smoking since last month"  Substance Use Topics  . Alcohol use: Yes    Comment: 4 40 oz beers per day  . Drug use: No    Types: "Crack" cocaine     Allergies   Patient has no known allergies.   Review of Systems Review of Systems  All other systems reviewed and are negative.   Physical Exam Updated Vital Signs BP (!) 152/108 (BP Location: Right Arm)   Pulse 98   Temp 97.9 F (36.6 C) (Oral)   Resp 16   Ht 5\' 5"  (1.651 m)   Wt 79.4 kg (175 lb)   SpO2 100%   BMI 29.12 kg/m   Physical Exam  Constitutional: He is oriented to person, place, and time. He appears well-developed and well-nourished.  HENT:  Head: Normocephalic and atraumatic.  Eyes: Pupils are equal, round, and reactive to light. Conjunctivae are normal. Right eye exhibits no discharge. Left eye exhibits no discharge. No scleral icterus.  Neck: Normal range of motion. No JVD present. No tracheal deviation present.  Pulmonary/Chest: Effort normal. No stridor.    Musculoskeletal:  No C,T, or L spine TTP, tenderness to palpation of the right lateral musculature - straight leg negative, distal sensation strength or motor function intact  Neurological: He is alert and oriented to person, place, and time. Coordination normal.  Psychiatric: He has a normal mood and affect. His behavior is normal. Judgment and thought content normal.  Nursing note and vitals reviewed.    ED Treatments / Results  Labs (all labs ordered are listed, but only abnormal results are displayed) Labs Reviewed  URINALYSIS, ROUTINE W REFLEX MICROSCOPIC - Abnormal; Notable for the following components:  Result Value   Protein, ur 30 (*)    All other components within normal limits  BASIC METABOLIC PANEL - Abnormal; Notable for the following components:   Glucose, Bld 141 (*)    BUN 24 (*)    Calcium 10.5 (*)    All other components within normal limits  CBC - Abnormal; Notable for the following components:   Platelets 459 (*)    All other components within normal limits    EKG None  Radiology Dg Lumbar Spine Complete  Result Date: 06/11/2018 CLINICAL DATA:  Recent onset of acute low back pain. Difficulty moving and bending back. EXAM: LUMBAR SPINE - COMPLETE 4+ VIEW COMPARISON:  Limited correlation made with chest radiographs 12/24/2017. FINDINGS: Five lumbar type vertebral bodies. There is straightening of the usual lumbar lordosis with a minimal anterolisthesis at L2-3 and L3-4. There is multilevel spondylosis with disc space narrowing, endplate osteophytes and sclerosis, greatest at L2-3 and L4-5. Mild facet degenerative changes are present. No evidence of acute fracture or pars defect. There is age advanced aortoiliac atherosclerosis. IMPRESSION: 1. Multilevel lumbar spondylosis without acute osseous findings or significant malalignment. 2. Age advanced aortoiliac atherosclerosis. Electronically Signed   By: Carey BullocksWilliam  Veazey M.D.   On: 06/11/2018 13:02     Procedures Procedures (including critical care time)  Medications Ordered in ED Medications  ibuprofen (ADVIL,MOTRIN) tablet 800 mg (800 mg Oral Given 06/11/18 1244)     Initial Impression / Assessment and Plan / ED Course  I have reviewed the triage vital signs and the nursing notes.  Pertinent labs & imaging results that were available during my care of the patient were reviewed by me and considered in my medical decision making (see chart for details).     Labs:  UA, BMP, CBC  Imaging:DG lumbar complete  Consults:  Therapeutics:ibuprofen  Discharge Meds:   Assessment/Plan: 57 year old male presents today with complaints of low back pain. Patient has a history of chronic low back pain, notes this is different. He has no red flags, x-rays without acute findings, labs reassuring, patient discharged with outpatient follow-up and return precautions. He verbalized understanding and agreement to today's plan had no further questions or concerns.      Final Clinical Impressions(s) / ED Diagnoses   Final diagnoses:  Right-sided low back pain without sciatica, unspecified chronicity    ED Discharge Orders    None       Rosalio LoudHedges, Laurrie Toppin, PA-C 06/11/18 1346    Melene PlanFloyd, Dan, DO 06/11/18 1640

## 2018-06-11 NOTE — ED Notes (Signed)
Pt back from xray. Placed on monitor.

## 2018-08-19 ENCOUNTER — Emergency Department (HOSPITAL_COMMUNITY): Payer: Medicaid Other

## 2018-08-19 ENCOUNTER — Emergency Department (HOSPITAL_COMMUNITY)
Admission: EM | Admit: 2018-08-19 | Discharge: 2018-08-19 | Disposition: A | Discharge status: Home / Self Care | Payer: Medicaid Other | Attending: Emergency Medicine | Admitting: Emergency Medicine

## 2018-08-19 ENCOUNTER — Encounter (HOSPITAL_COMMUNITY): Payer: Self-pay | Admitting: Emergency Medicine

## 2018-08-19 DIAGNOSIS — F17210 Nicotine dependence, cigarettes, uncomplicated: Secondary | ICD-10-CM | POA: Insufficient documentation

## 2018-08-19 DIAGNOSIS — I10 Essential (primary) hypertension: Secondary | ICD-10-CM | POA: Diagnosis not present

## 2018-08-19 DIAGNOSIS — E119 Type 2 diabetes mellitus without complications: Secondary | ICD-10-CM | POA: Insufficient documentation

## 2018-08-19 DIAGNOSIS — F1010 Alcohol abuse, uncomplicated: Secondary | ICD-10-CM | POA: Diagnosis not present

## 2018-08-19 DIAGNOSIS — R079 Chest pain, unspecified: Secondary | ICD-10-CM | POA: Diagnosis present

## 2018-08-19 DIAGNOSIS — J441 Chronic obstructive pulmonary disease with (acute) exacerbation: Secondary | ICD-10-CM | POA: Diagnosis not present

## 2018-08-19 DIAGNOSIS — Z7984 Long term (current) use of oral hypoglycemic drugs: Secondary | ICD-10-CM | POA: Insufficient documentation

## 2018-08-19 DIAGNOSIS — Z79899 Other long term (current) drug therapy: Secondary | ICD-10-CM | POA: Insufficient documentation

## 2018-08-19 DIAGNOSIS — R0789 Other chest pain: Secondary | ICD-10-CM

## 2018-08-19 LAB — BASIC METABOLIC PANEL
ANION GAP: 17 — AB (ref 5–15)
BUN: 19 mg/dL (ref 6–20)
CALCIUM: 9.9 mg/dL (ref 8.9–10.3)
CHLORIDE: 97 mmol/L — AB (ref 98–111)
CO2: 23 mmol/L (ref 22–32)
Creatinine, Ser: 1.16 mg/dL (ref 0.61–1.24)
GFR calc non Af Amer: >60 mL/min (ref >60)
GLUCOSE: 145 mg/dL — AB (ref 70–99)
Potassium: 3.8 mmol/L (ref 3.5–5.1)
Sodium: 137 mmol/L (ref 135–145)

## 2018-08-19 LAB — CBC
HEMATOCRIT: 38.7 % — AB (ref 39.0–52.0)
HEMOGLOBIN: 12.8 g/dL — AB (ref 13.0–17.0)
MCH: 29.3 pg (ref 26.0–34.0)
MCHC: 33.1 g/dL (ref 30.0–36.0)
MCV: 88.6 fL (ref 78.0–100.0)
Platelets: 422 10*3/uL — ABNORMAL HIGH (ref 150–400)
RBC: 4.37 MIL/uL (ref 4.22–5.81)
RDW: 15.7 % — ABNORMAL HIGH (ref 11.5–15.5)
WBC: 9.5 10*3/uL (ref 4.0–10.5)

## 2018-08-19 LAB — CBG MONITORING, ED: Glucose-Capillary: 132 mg/dL — ABNORMAL HIGH (ref 70–99)

## 2018-08-19 LAB — I-STAT TROPONIN, ED: TROPONIN I, POC: 0 ng/mL (ref 0.00–0.08)

## 2018-08-19 IMAGING — DX DG CHEST 2V
2 series · 2 of 2 positions shown · non-contrast
Comparison: [DATE]

CLINICAL DATA: Chest pain

EXAM:
CHEST - 2 VIEW

[chest lat]
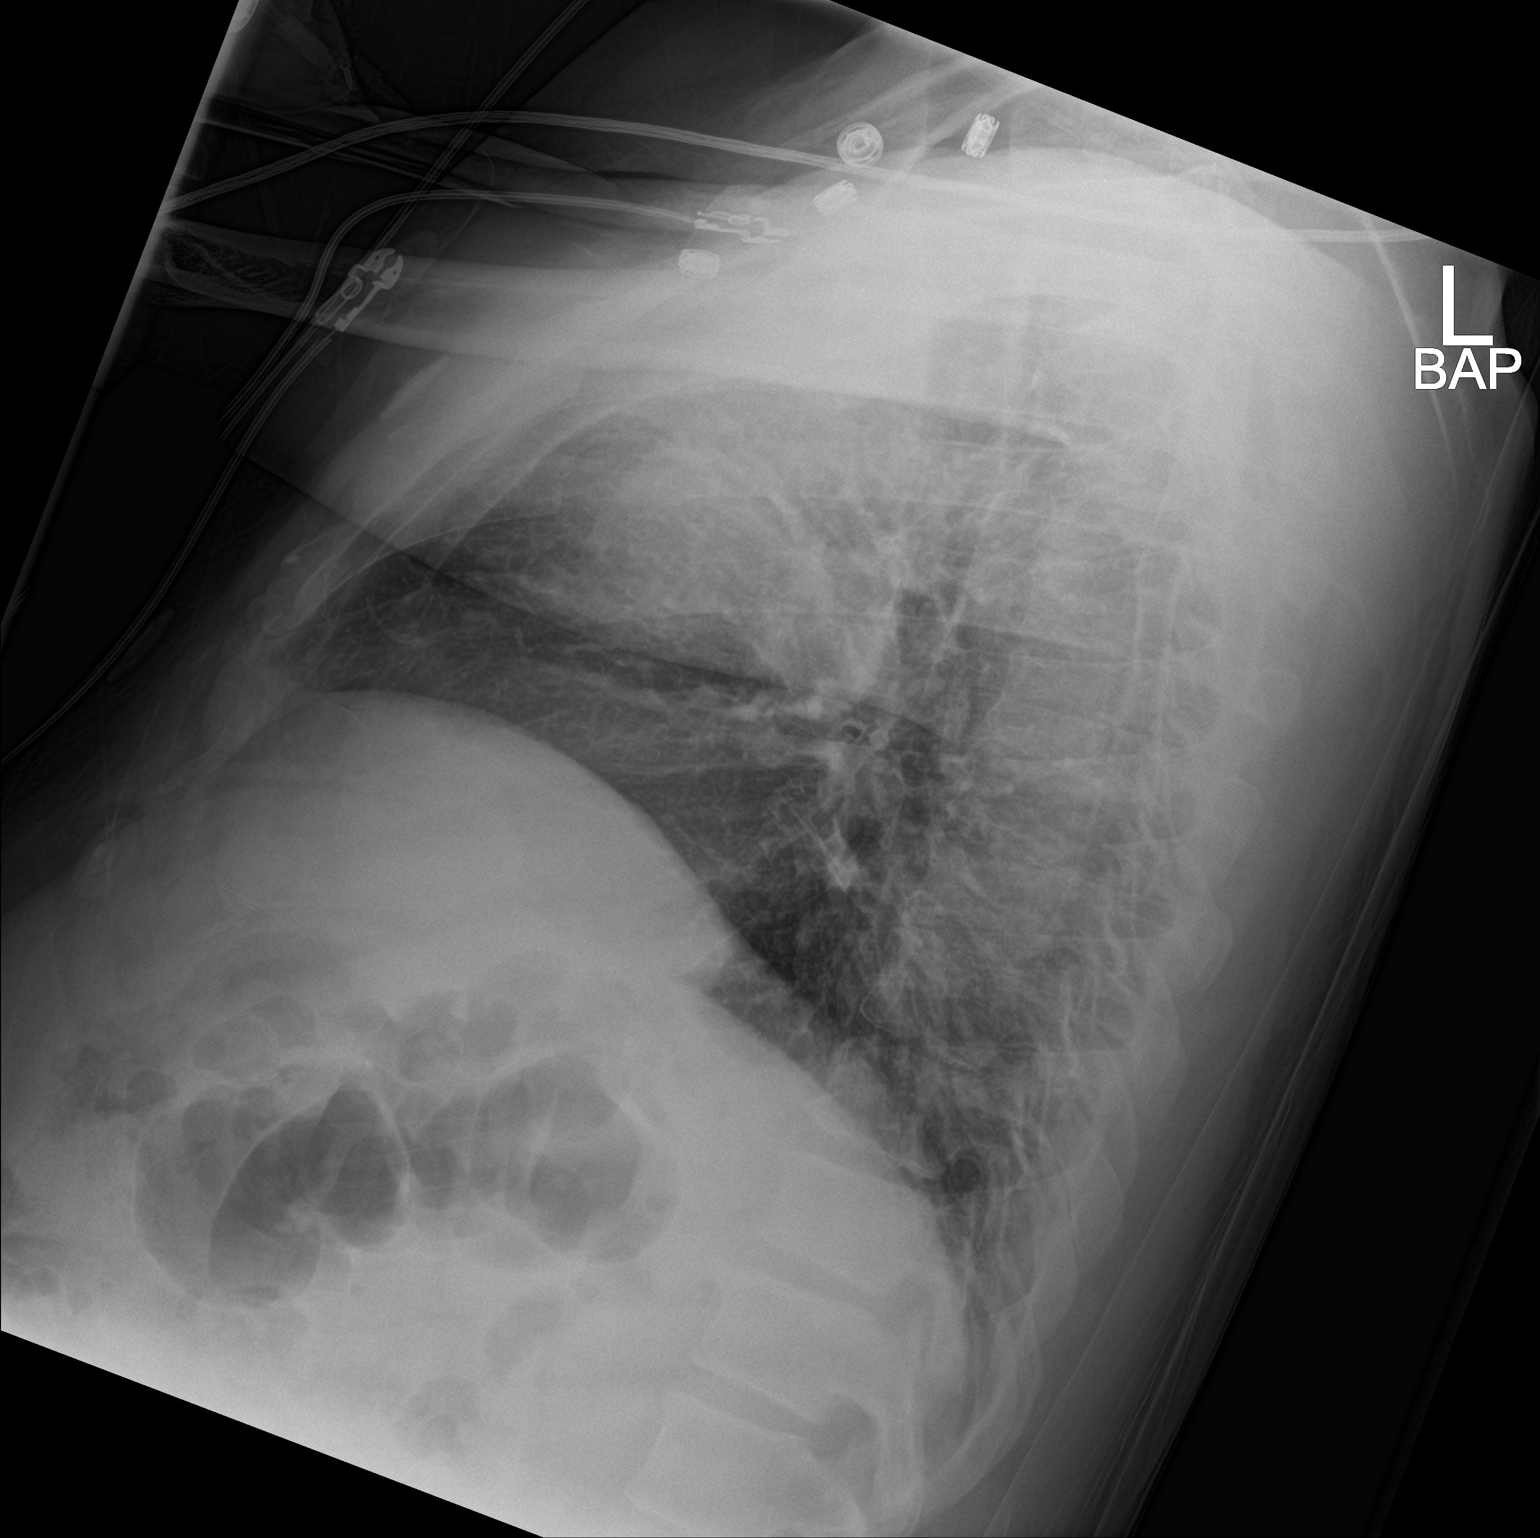

[chest ap]
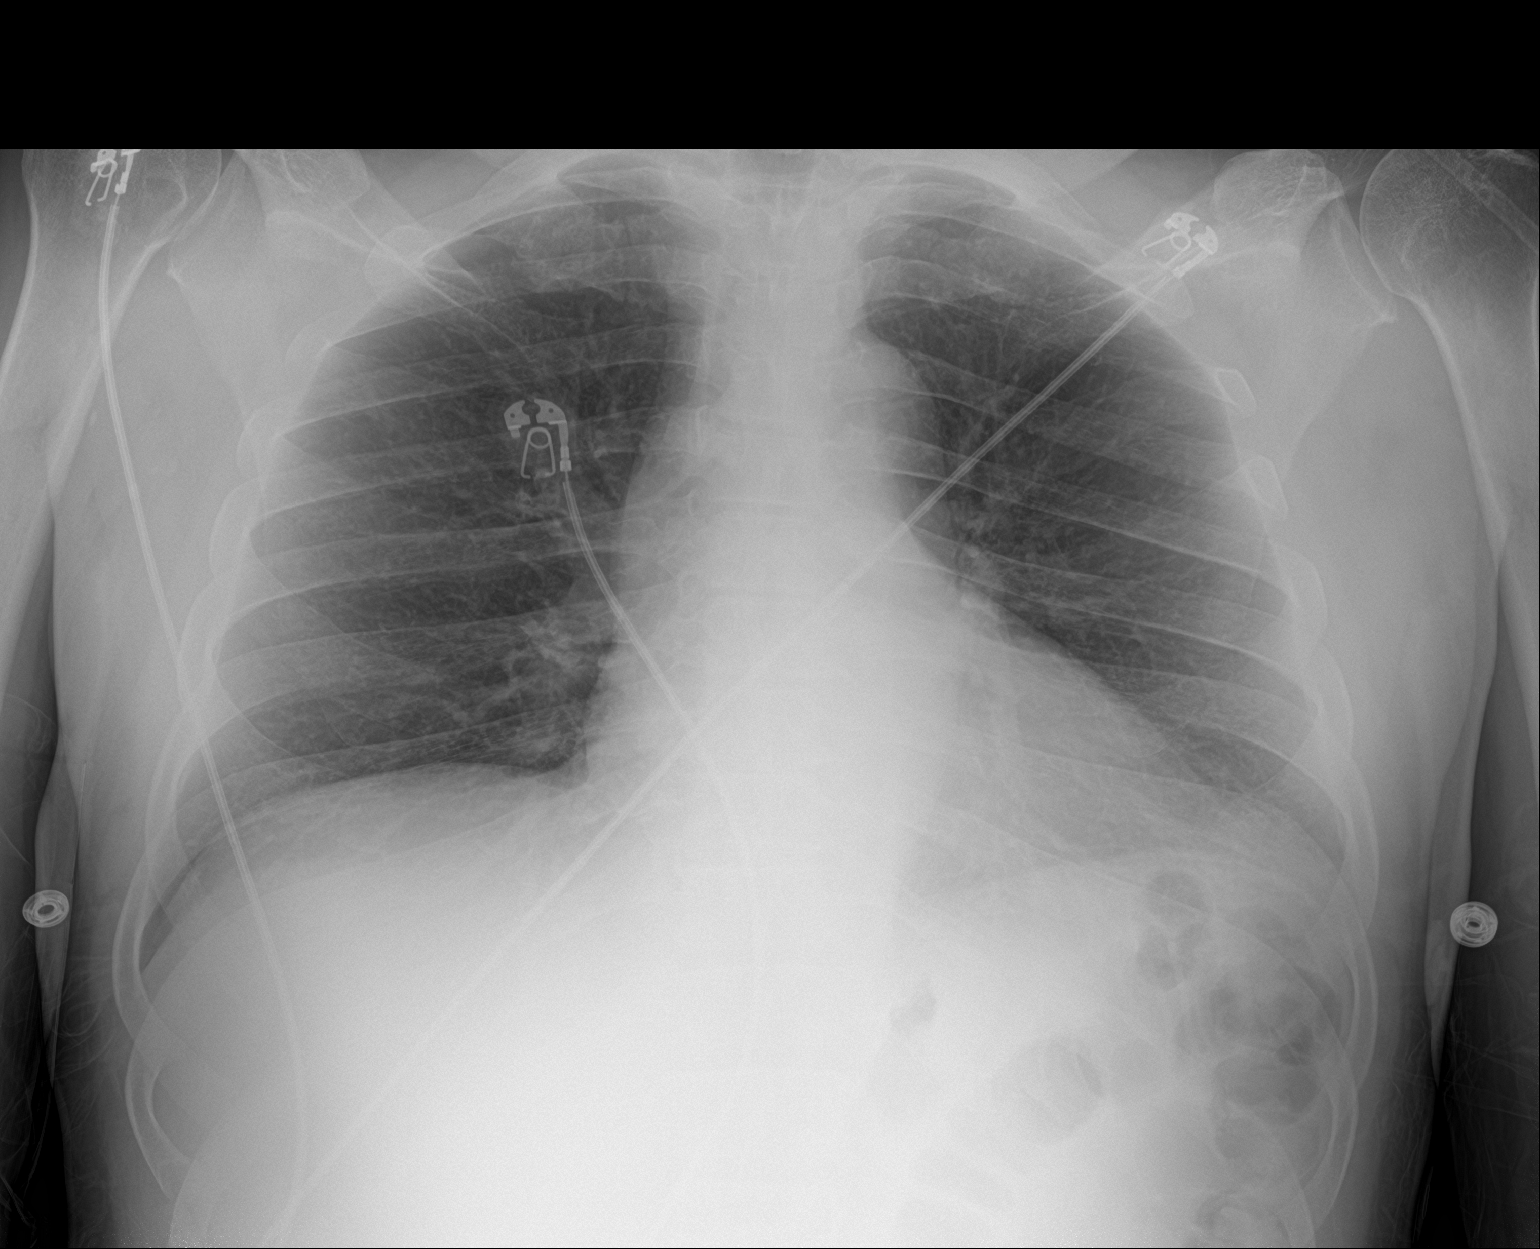

[2 of 2 positions shown; findings below may reference images not displayed]

FINDINGS: No edema or consolidation. Heart size and pulmonary vascularity are
normal. No adenopathy. There is mild prominence of epicardial fat
along the left heart border. No evident bone lesions.
IMPRESSION: No edema or consolidation.

## 2018-08-19 MED ORDER — DOXYCYCLINE HYCLATE 100 MG PO CAPS: 100.0000 mg | ORAL_CAPSULE | Freq: Two times a day (BID) | ORAL | 0 refills | Status: DC

## 2018-08-19 MED ORDER — ALBUTEROL SULFATE HFA 108 (90 BASE) MCG/ACT IN AERS
2.0000 | INHALATION_SPRAY | RESPIRATORY_TRACT | Status: DC | PRN
  Administered 2018-08-19: 2 via RESPIRATORY_TRACT
  Filled 2018-08-19: qty 6.7

## 2018-08-19 MED ORDER — AEROCHAMBER PLUS FLO-VU LARGE MISC: 1.0000 | Freq: Once | Status: DC

## 2018-08-19 MED ORDER — ACETAMINOPHEN 325 MG PO TABS
650.0000 mg | ORAL_TABLET | Freq: Once | ORAL | Status: AC
  Administered 2018-08-19: 650 mg via ORAL
  Filled 2018-08-19: qty 2

## 2018-08-19 MED ORDER — ALBUTEROL SULFATE (2.5 MG/3ML) 0.083% IN NEBU
5.0000 mg | INHALATION_SOLUTION | Freq: Once | RESPIRATORY_TRACT | Status: AC
  Administered 2018-08-19: 5 mg via RESPIRATORY_TRACT
  Filled 2018-08-19: qty 6

## 2018-08-19 MED ORDER — KETOROLAC TROMETHAMINE 30 MG/ML IJ SOLN
30.0000 mg | Freq: Once | INTRAMUSCULAR | Status: AC
  Administered 2018-08-19: 30 mg via INTRAVENOUS
  Filled 2018-08-19: qty 1

## 2018-08-19 MED ORDER — IPRATROPIUM-ALBUTEROL 0.5-2.5 (3) MG/3ML IN SOLN
3.0000 mL | Freq: Once | RESPIRATORY_TRACT | Status: AC
  Administered 2018-08-19: 3 mL via RESPIRATORY_TRACT
  Filled 2018-08-19: qty 3

## 2018-08-19 MED ORDER — PREDNISONE 10 MG PO TABS: 20.0000 mg | ORAL_TABLET | Freq: Two times a day (BID) | ORAL | 0 refills | Status: DC

## 2018-08-19 MED ORDER — METHYLPREDNISOLONE SODIUM SUCC 125 MG IJ SOLR
125.0000 mg | Freq: Once | INTRAMUSCULAR | Status: AC
  Administered 2018-08-19: 125 mg via INTRAVENOUS
  Filled 2018-08-19: qty 2

## 2018-08-19 NOTE — ED Notes (Signed)
Patient transported to X-ray 

## 2018-08-19 NOTE — ED Provider Notes (Signed)
[facility] EMERGENCY DEPARTMENT Provider Note   CSN: [id number] Arrival date & time: 08/19/18  1432     History   Chief Complaint Chief Complaint  Patient presents with  . Chest Pain    HPI Darryl Hurley is a 57 y.o. male.  HPI  57 year old male history of COPD, asthma, alcohol abuse presents today complaining of right-sided chest pain is been present for 3 days with associated cough and dyspnea.  He has had some subjective fever and chills.  Is a smoker and continues to smoke.  The pain is sharp and moderate to severe.  It is worse with movement and deep breathing.  He has attempted no interventions at home.  Past Medical History:  Diagnosis Date  . Alcohol abuse   . Angina pectoris   . Arthritis   . Asthma   . Chronic lower back pain   . COPD (chronic obstructive pulmonary disease) (HCC)    on home O2 2L  . Diabetes mellitus without complication (HCC)   . Exertional shortness of breath   . Gout   . Hypertension   . Seizures [facility])     Patient Active Problem List   Diagnosis Date Noted  . Lactic acidosis 01/06/2016  . Lactic acid acidosis 01/06/2016  . Alcohol abuse with intoxication (HCC) 01/06/2016  . COPD with acute exacerbation (HCC) 02/02/2014  . HCAP (healthcare-associated pneumonia) 02/02/2014  . Increased anion gap metabolic acidosis 02/02/2014  . Cocaine abuse (HCC) 11/18/2013  . Fever 11/17/2013  . Anemia 11/17/2013  . Hypokalemia 11/17/2013  . Polyarthritis 11/17/2013  . Dehydration 11/17/2013  . Type 2 diabetes mellitus (HCC) 05/14/2013  . Chronic obstructive pulmonary disease (COPD) (HCC) 05/12/2013  . Gout 05/12/2013  . Headache 05/12/2013  . Asthma 05/04/2013  . TOBACCO ABUSE 04/27/2009  . Uncontrolled hypertension 04/27/2009    Past Surgical History:  Procedure Laterality Date  . NO PAST SURGERIES          Home Medications    Prior to Admission medications   Medication Sig Start Date End Date Taking?  Authorizing Provider  albuterol (PROVENTIL HFA;VENTOLIN HFA) 108 (90 BASE) MCG/ACT inhaler Inhale 1 puff into the lungs every 6 (six) hours as needed for wheezing or shortness of breath.    [provider]  allopurinol (ZYLOPRIM) 100 MG tablet Take 100 mg by mouth daily.    [provider]  atenolol (TENORMIN) 100 MG tablet Take 1 tablet (100 mg total) by mouth every morning. Reported on 01/06/2016 01/08/16   Alison Murray, MD  atenolol (TENORMIN) 100 MG tablet Take 1 tablet (100 mg total) by mouth daily. 12/24/17   Street, [place], PA-C  colchicine 0.6 MG tablet Take one tablet by mouth every morning for 5 days beginning 12/25/17 12/24/17   Street, [place], PA-C  cyclobenzaprine (FLEXERIL) 10 MG tablet Take 1 tablet (10 mg total) by mouth 2 (two) times daily as needed for muscle spasms. 04/26/18   McDonald, Mia A, PA-C  folic acid (FOLVITE) 1 MG tablet Take 1 tablet (1 mg total) by mouth daily. 01/08/16   Alison Murray, MD  HYDROcodone-acetaminophen [facility] 5-325 MG tablet Take 1 tablet by mouth every 6 (six) hours as needed for severe pain. 12/24/17   Street, [place], PA-C  ibuprofen (ADVIL,MOTRIN) 800 MG tablet Take 1 tablet (800 mg total) by mouth every 8 (eight) hours as needed for mild pain. 08/23/17   Ward, Layla Maw, DO  losartan (COZAAR) 100 MG tablet Take 1 tablet (  100 mg total) by mouth every morning. Reported on 01/06/2016 01/08/16   Alison Murrayevine, Alma M, MD  losartan (COZAAR) 100 MG tablet Take 1 tablet (100 mg total) by mouth daily. 12/24/17   Street, [place], PA-C  meloxicam (MOBIC) 15 MG tablet Take 1 tablet (15 mg total) by mouth daily. 04/26/18   McDonald, Mia A, PA-C  metFORMIN (GLUCOPHAGE) 1000 MG tablet Take 1 tablet (1,000 mg total) by mouth 2 (two) times daily with a meal. 01/08/16   Alison Murrayevine, Alma M, MD  methocarbamol (ROBAXIN) 500 MG tablet Take 1 tablet (500 mg total) by mouth every 8 (eight) hours as needed for muscle spasms. 08/23/17   Ward, Layla MawKristen N, DO  naproxen (NAPROSYN)  500 MG tablet Take 1 tablet (500 mg total) by mouth 2 (two) times daily as needed for mild pain, moderate pain or headache (TAKE WITH MEALS.). 12/24/17   Street, [place], PA-C  predniSONE (DELTASONE) 20 MG tablet 3 tabs po daily x 4 days STARTING 12/25/17 12/24/17   Street, [place], PA-C  rosuvastatin (CRESTOR) 40 MG tablet Take 40 mg by mouth daily.    [provider]  thiamine 100 MG tablet Take 1 tablet (100 mg total) by mouth daily. 01/08/16   Alison Murrayevine, Alma M, MD    Family History Family History  Problem Relation Age of Onset  . Diabetes type II Mother   . Depression Father   . Suicidality Father   . Asthma Brother     Social History Social History   Tobacco Use  . Smoking status: Current Every Day Smoker    Packs/day: 0.50    Years: 40.00    Pack years: 20.00    Types: Cigarettes  . Smokeless tobacco: Never Used  . Tobacco comment: 05/12/2013 'eased off smoking since last month"  Substance Use Topics  . Alcohol use: Yes    Comment: 4 40 oz beers per day  . Drug use: No    Types: "Crack" cocaine     Allergies   Patient has no known allergies.   Review of Systems Review of Systems  All other systems reviewed and are negative.    Physical Exam Updated Vital Signs BP (!) 141/105   Pulse (!) 104   Temp 98.7 F (37.1 C) (Oral)   Resp (!) 31   SpO2 97%   Physical Exam  Constitutional: He is oriented to person, place, and time. He appears well-developed and well-nourished. He appears ill.  HENT:  Head: Normocephalic.  Eyes: Pupils are equal, round, and reactive to light.  Neck: Normal range of motion.  Cardiovascular: Tachycardia present.  Pulmonary/Chest: He is in respiratory distress.  Diffuse rhonchi with increased work of breathing some right-sided chest tenderness to palpation  Abdominal: Soft. Bowel sounds are normal.  Musculoskeletal: Normal range of motion. He exhibits edema.  Neurological: He is alert and oriented to person, place, and time.    Skin: Skin is warm. Capillary refill takes less than 2 seconds.  Psychiatric: He has a normal mood and affect.  Nursing note and vitals reviewed.    ED Treatments / Results  Labs (all labs ordered are listed, but only abnormal results are displayed) Labs Reviewed  BASIC METABOLIC PANEL - Abnormal; Notable for the following components:      Result Value   Chloride 97 (*)    Glucose, Bld 145 (*)    Anion gap 17 (*)    All other components within normal limits  CBC - Abnormal; Notable for the following components:  Hemoglobin 12.8 (*)    HCT 38.7 (*)    RDW 15.7 (*)    Platelets 422 (*)    All other components within normal limits  CBG MONITORING, ED - Abnormal; Notable for the following components:   Glucose-Capillary 132 (*)    All other components within normal limits  I-STAT TROPONIN, ED    EKG EKG Interpretation  Date/Time:  Tuesday August 19 2018 14:40:58 EDT Ventricular Rate:  108 PR Interval:  132 QRS Duration: 80 QT Interval:  326 QTC Calculation: 436 R Axis:   57 Text Interpretation:  Sinus tachycardia Possible Anterior infarct , age undetermined Abnormal ECG Confirmed by Margarita Grizzle [phone]) on 08/19/2018 3:04:45 PM Also confirmed by Margarita Grizzle [phone]), editor Barbette Hair [phone])  on 08/19/2018 3:21:02 PM   Radiology Dg Chest 2 View  Result Date: 08/19/2018 CLINICAL DATA:  Chest pain EXAM: CHEST - 2 VIEW COMPARISON:  December 24, 2017 FINDINGS: No edema or consolidation. Heart size and pulmonary vascularity are normal. No adenopathy. There is mild prominence of epicardial fat along the left heart border. No evident bone lesions. IMPRESSION: No edema or consolidation. Electronically Signed   By: Bretta Bang III M.D.   On: 08/19/2018 15:51    Procedures Procedures (including critical care time)  Medications Ordered in ED Medications  ipratropium-albuterol (DUONEB) 0.5-2.5 (3) MG/3ML nebulizer solution 3 mL (3 mLs Nebulization Given 08/19/18  1511)     Initial Impression / Assessment and Plan / ED Course  I have reviewed the triage vital signs and the nursing notes.  Pertinent labs & imaging results that were available during my care of the patient were reviewed by me and considered in my medical decision making (see chart for details).     CXR clear, labs reviewed and normal EKG with tachycardia at 108 No evidence consolidation on cxr Doubt PE as patient with diffuse rhonchi and likely copd exacerbation Plan toradol, doxycycline and reevaluation 5:34 PM Patient off oxygen sats at 95%, rr 24 some rhonchi Plan albuterol hfa, prednisone, doxy Discussed smoking cessation Return precautions Need for follow up   Final Clinical Impressions(s) / ED Diagnoses   Final diagnoses:  COPD exacerbation (HCC)  Chest wall pain    ED Discharge Orders    None       Margarita Grizzle, MD 08/19/18 1738

## 2018-08-19 NOTE — ED Provider Notes (Signed)
MSE was initiated and I personally evaluated the patient and placed orders (if any) at  2:46 PM on August 19, 2018.  The patient appears stable so that the remainder of the MSE may be completed by another provider.  Patient placed in Quick Look pathway, seen and evaluated   Chief Complaint: chest pain, SOB  HPI:  57yo M with pmh of COPD, HTN, DM, seizures, presents to ED for 3 day history of chest pain, shortness of breath. Reports feeling like it could be his COPD. No improvement with hold albuterol. History limited 2/2 discomfort. Family member at bedside states this is what happens before onset of seizure.  ROS: chest pain  Physical Exam:   Gen: No distress  Neuro: Awake and Alert  Skin: Warm    Focused Exam: tachycardic to 110s. Decreased breath sounds bilaterally. Speaking short sentences.   Patient taken to room immediately after my evaluation.   Initiation of care has begun. The patient has been counseled on the process, plan, and necessity for staying for the completion/evaluation, and the remainder of the medical screening examination    Dietrich PatesKhatri, Suprena Travaglini, PA-C 08/19/18 1449    Pricilla LovelessGoldston, Scott, MD 08/19/18 1616

## 2018-08-19 NOTE — ED Triage Notes (Addendum)
Pt arrives to ED with right sided chest pain since 3 days ago, pt had rhonic in right lung and decreased breath sounds on the right bases.

## 2018-08-19 NOTE — Discharge Instructions (Addendum)
Use albuterol albuterol HFA 2 puffs every 4 hours Take medications as prescribed Return if you are having worsening breathing or pain

## 2018-09-05 ENCOUNTER — Emergency Department (HOSPITAL_COMMUNITY): Payer: Medicaid Other

## 2018-09-05 ENCOUNTER — Other Ambulatory Visit: Payer: Self-pay

## 2018-09-05 ENCOUNTER — Emergency Department (HOSPITAL_COMMUNITY)
Admission: EM | Admit: 2018-09-05 | Discharge: 2018-09-05 | Disposition: A | Discharge status: Home / Self Care | Payer: Medicaid Other | Attending: Emergency Medicine | Admitting: Emergency Medicine

## 2018-09-05 ENCOUNTER — Encounter (HOSPITAL_COMMUNITY): Payer: Self-pay

## 2018-09-05 ENCOUNTER — Ambulatory Visit (HOSPITAL_BASED_OUTPATIENT_CLINIC_OR_DEPARTMENT_OTHER): Payer: Medicaid Other

## 2018-09-05 DIAGNOSIS — J449 Chronic obstructive pulmonary disease, unspecified: Secondary | ICD-10-CM | POA: Diagnosis not present

## 2018-09-05 DIAGNOSIS — M79671 Pain in right foot: Secondary | ICD-10-CM | POA: Diagnosis not present

## 2018-09-05 DIAGNOSIS — I82451 Acute embolism and thrombosis of right peroneal vein: Secondary | ICD-10-CM | POA: Diagnosis not present

## 2018-09-05 DIAGNOSIS — F17210 Nicotine dependence, cigarettes, uncomplicated: Secondary | ICD-10-CM | POA: Diagnosis not present

## 2018-09-05 DIAGNOSIS — M7989 Other specified soft tissue disorders: Secondary | ICD-10-CM | POA: Diagnosis not present

## 2018-09-05 DIAGNOSIS — Z79899 Other long term (current) drug therapy: Secondary | ICD-10-CM | POA: Insufficient documentation

## 2018-09-05 DIAGNOSIS — M79609 Pain in unspecified limb: Secondary | ICD-10-CM | POA: Diagnosis not present

## 2018-09-05 DIAGNOSIS — R0602 Shortness of breath: Secondary | ICD-10-CM | POA: Diagnosis present

## 2018-09-05 DIAGNOSIS — E119 Type 2 diabetes mellitus without complications: Secondary | ICD-10-CM | POA: Insufficient documentation

## 2018-09-05 DIAGNOSIS — Z7984 Long term (current) use of oral hypoglycemic drugs: Secondary | ICD-10-CM | POA: Insufficient documentation

## 2018-09-05 DIAGNOSIS — I10 Essential (primary) hypertension: Secondary | ICD-10-CM | POA: Diagnosis not present

## 2018-09-05 DIAGNOSIS — R2241 Localized swelling, mass and lump, right lower limb: Secondary | ICD-10-CM | POA: Diagnosis not present

## 2018-09-05 LAB — CBC WITH DIFFERENTIAL/PLATELET
Abs Immature Granulocytes: 0.1 10*3/uL (ref 0.0–0.1)
Basophils Absolute: 0 10*3/uL (ref 0.0–0.1)
Basophils Relative: 0 %
EOS PCT: 1 %
Eosinophils Absolute: 0.1 10*3/uL (ref 0.0–0.7)
HEMATOCRIT: 38.5 % — AB (ref 39.0–52.0)
HEMOGLOBIN: 12.5 g/dL — AB (ref 13.0–17.0)
Immature Granulocytes: 1 %
LYMPHS ABS: 0.9 10*3/uL (ref 0.7–4.0)
LYMPHS PCT: 6 %
MCH: 29.8 pg (ref 26.0–34.0)
MCHC: 32.5 g/dL (ref 30.0–36.0)
MCV: 91.9 fL (ref 78.0–100.0)
MONO ABS: 0.9 10*3/uL (ref 0.1–1.0)
Monocytes Relative: 7 %
Neutro Abs: 12 10*3/uL — ABNORMAL HIGH (ref 1.7–7.7)
Neutrophils Relative %: 85 %
Platelets: 156 10*3/uL (ref 150–400)
RBC: 4.19 MIL/uL — ABNORMAL LOW (ref 4.22–5.81)
RDW: 16.1 % — ABNORMAL HIGH (ref 11.5–15.5)
WBC: 14.1 10*3/uL — ABNORMAL HIGH (ref 4.0–10.5)

## 2018-09-05 LAB — BASIC METABOLIC PANEL
Anion gap: 15 (ref 5–15)
BUN: 21 mg/dL — ABNORMAL HIGH (ref 6–20)
CO2: 20 mmol/L — ABNORMAL LOW (ref 22–32)
Calcium: 10 mg/dL (ref 8.9–10.3)
Chloride: 100 mmol/L (ref 98–111)
Creatinine, Ser: 1.31 mg/dL — ABNORMAL HIGH (ref 0.61–1.24)
GFR calc Af Amer: >60 mL/min (ref >60)
GFR calc non Af Amer: 59 mL/min — ABNORMAL LOW (ref >60)
Glucose, Bld: 203 mg/dL — ABNORMAL HIGH (ref 70–99)
Potassium: 3.6 mmol/L (ref 3.5–5.1)
Sodium: 135 mmol/L (ref 135–145)

## 2018-09-05 IMAGING — CR DG CHEST 2V
2 series · 2 of 2 positions shown · non-contrast
Comparison: [DATE]

CLINICAL DATA: Cough with chest pain and shortness of breath

EXAM:
CHEST - 2 VIEW

[chest lat]
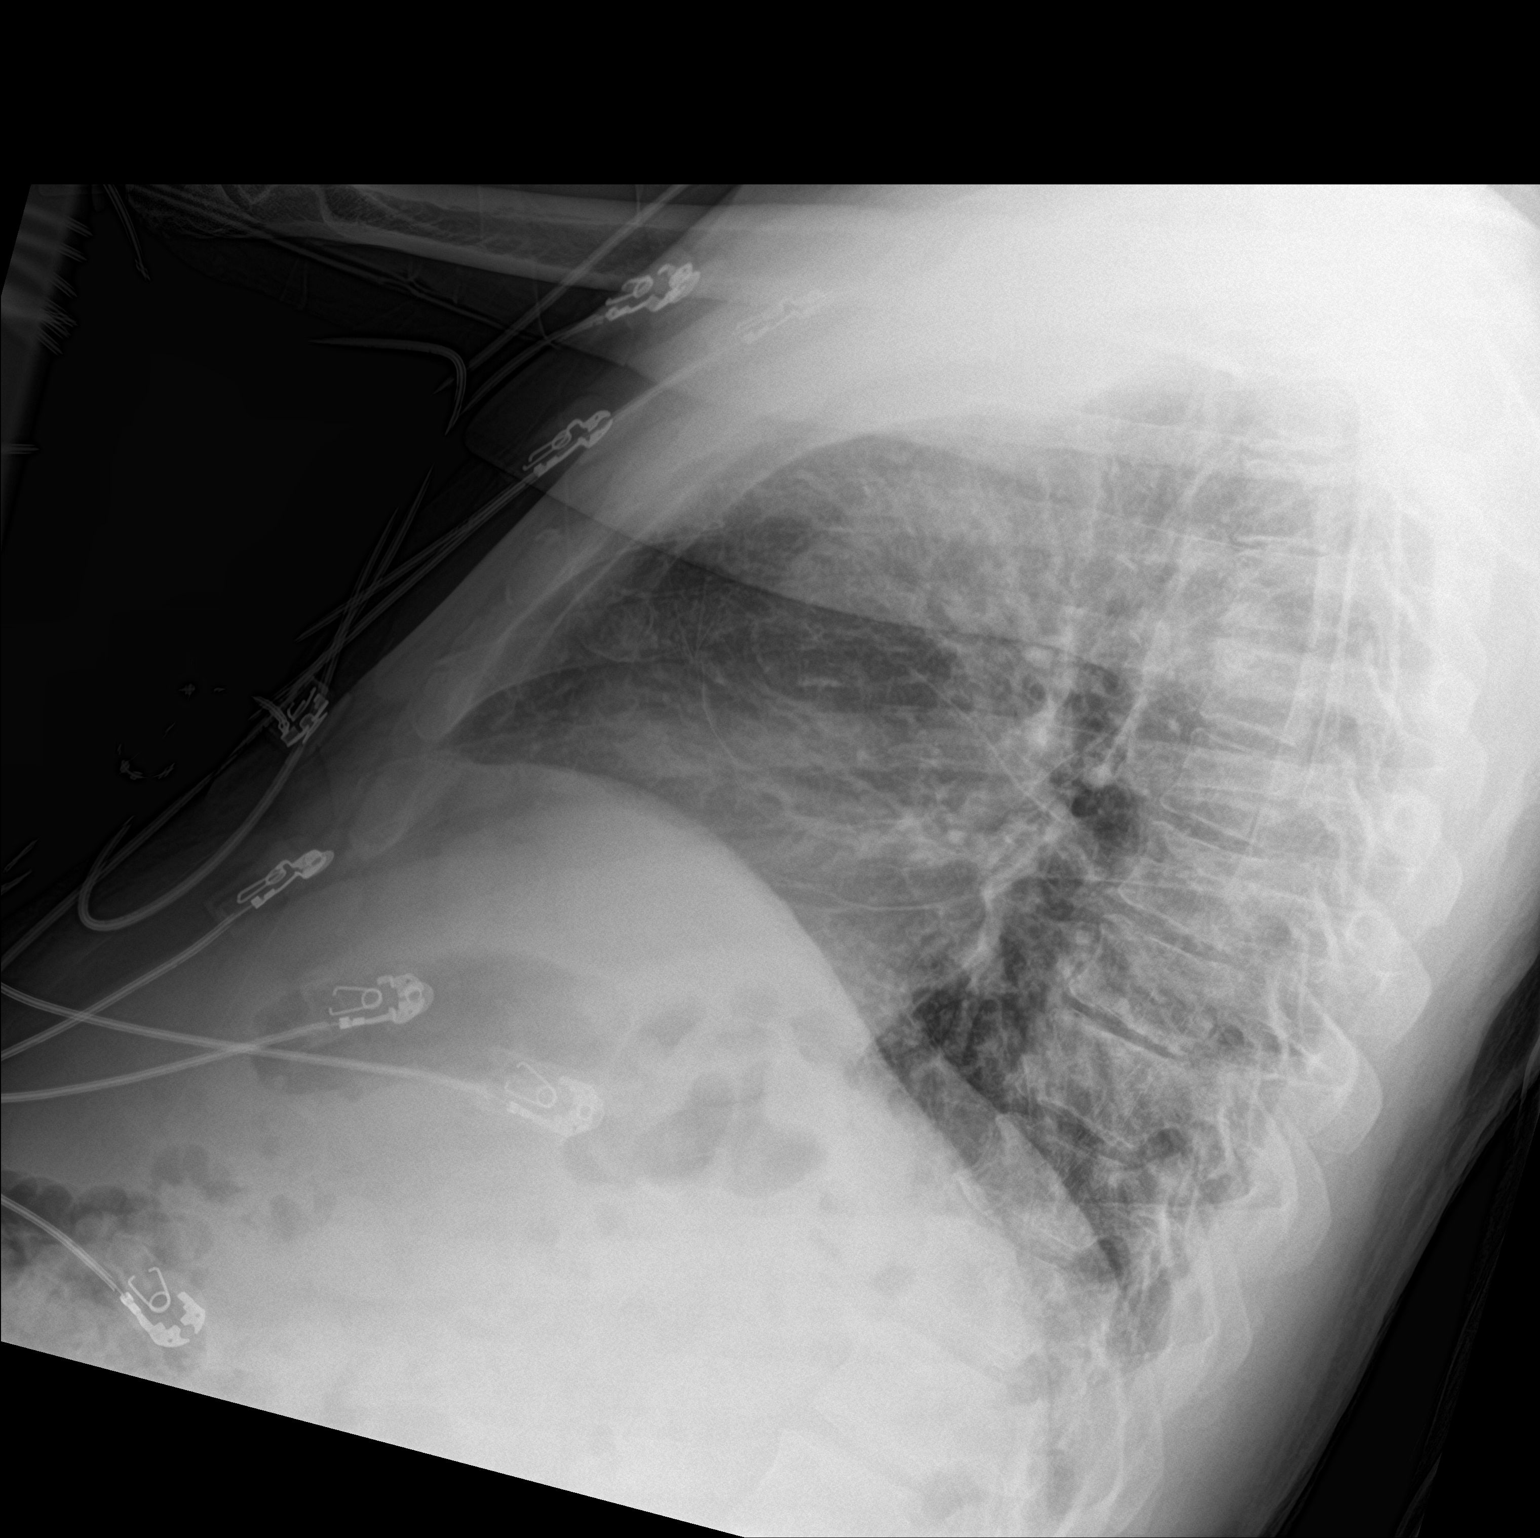

[chest ap]
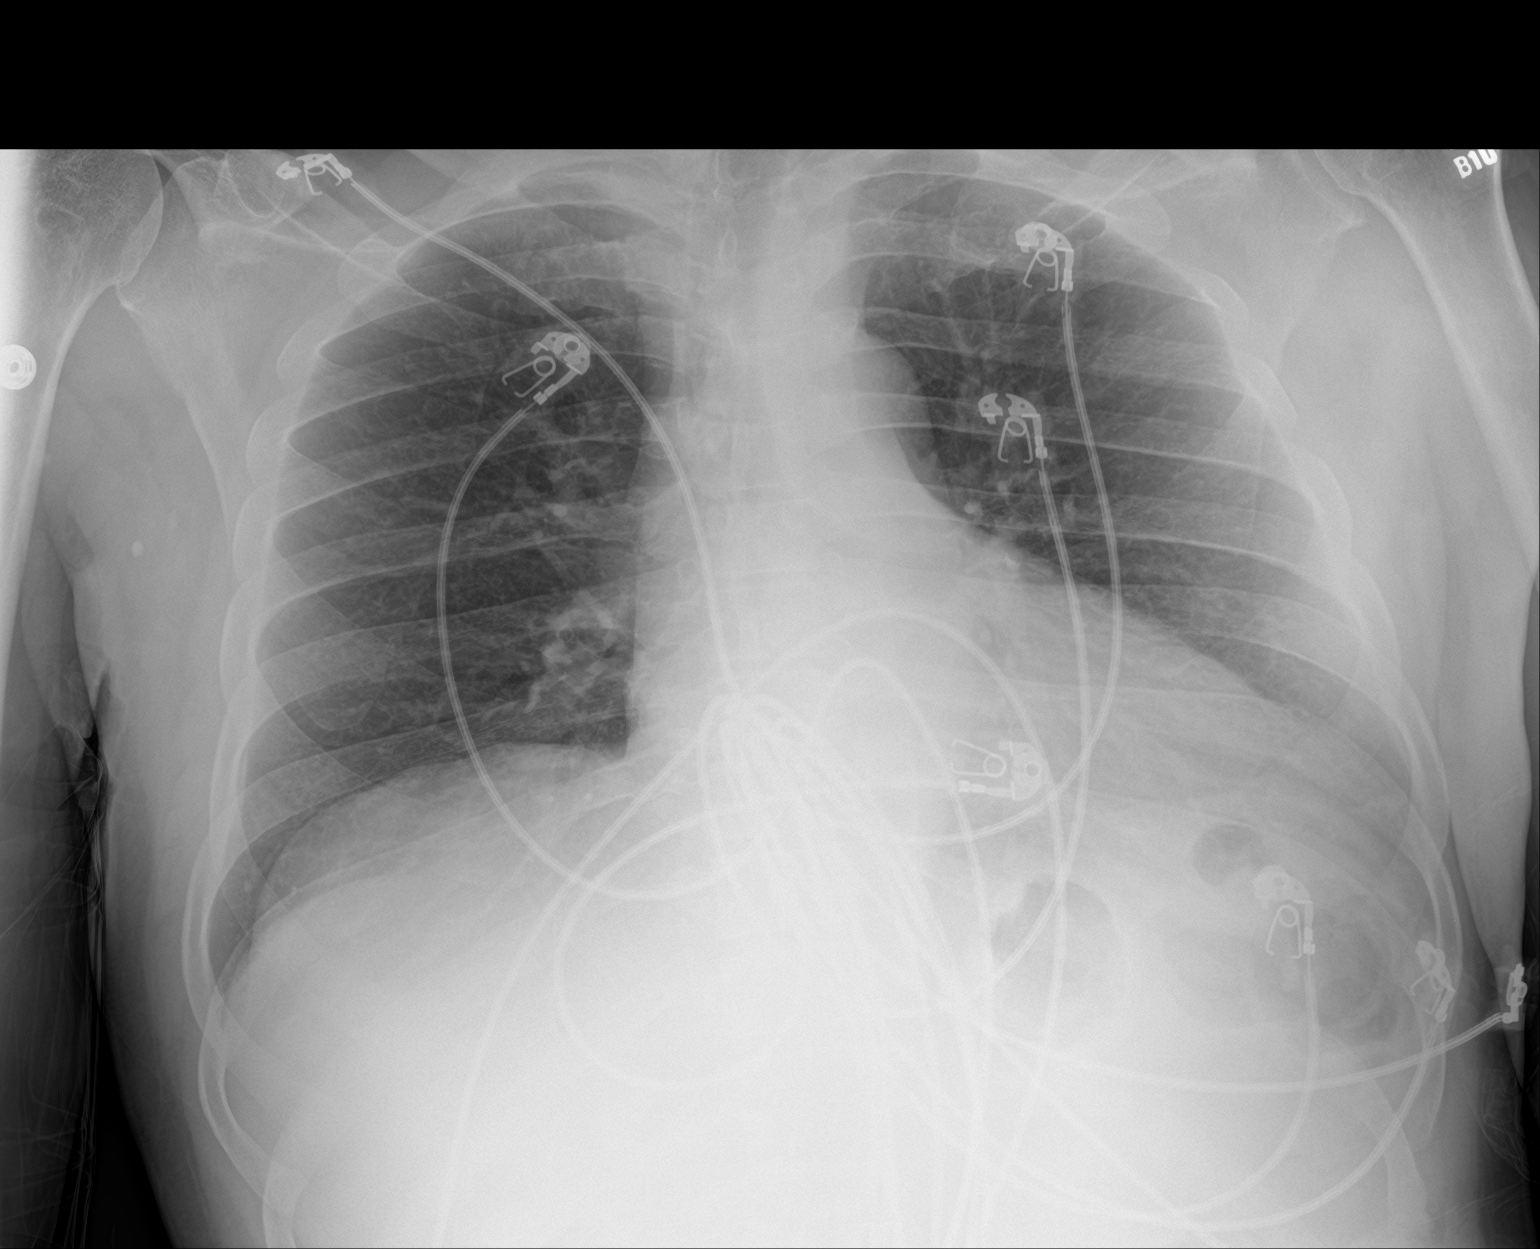

[2 of 2 positions shown; findings below may reference images not displayed]

FINDINGS: There is no edema or consolidation. Heart is upper normal in size
with pulmonary vascularity normal. No adenopathy. No pneumothorax.
No bone lesions.
IMPRESSION: No edema or consolidation.  Stable cardiac silhouette.

## 2018-09-05 IMAGING — CT CT ANGIO CHEST
2 of 7 series · 18 of 46 positions shown · IV contrast (APPLIED)
Comparison: Chest CT [DATE]

CLINICAL DATA: Difficulty breathing with fever for 4 days

EXAM:
CT ANGIOGRAPHY CHEST WITH CONTRAST
TECHNIQUE: Multidetector CT imaging of the chest was performed using the
standard protocol during bolus administration of intravenous
contrast. Multiplanar CT image reconstructions and MIPs were
obtained to evaluate the vascular anatomy.
CONTRAST:  67mL [DC] IOPAMIDOL ([DC]) INJECTION 76%

[Series 7: thins · axial · 0.68mm/px · z∈[+1089,+1324]mm · 15 of 378 slices shown]
[im 21/378  lung]
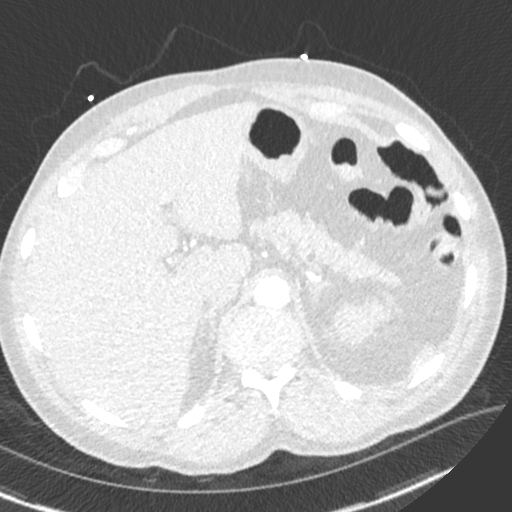
[im 42/378  soft-tissue]
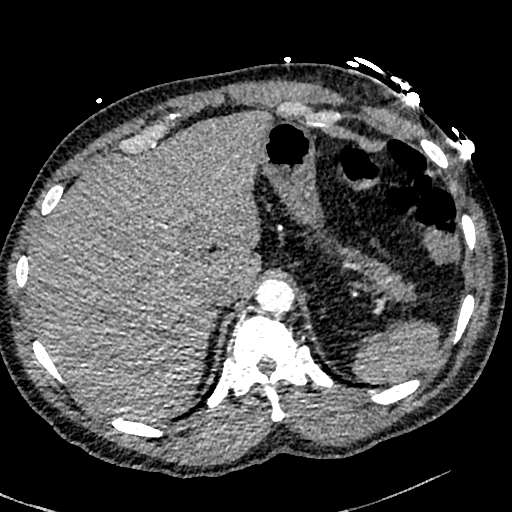
[im 63/378  lung]
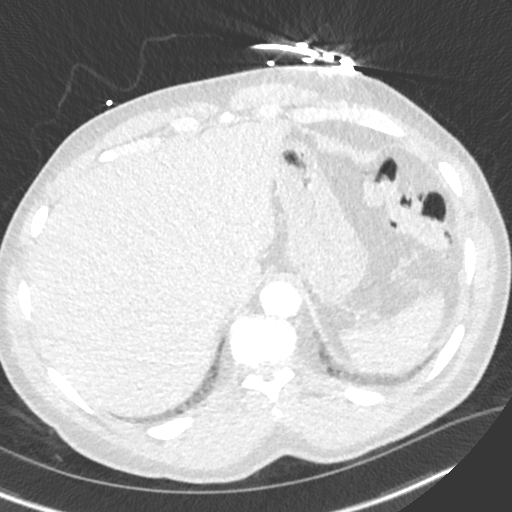
[im 84/378  soft-tissue]
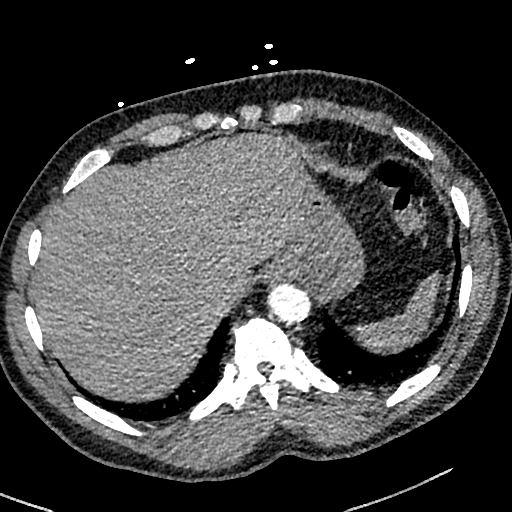
[im 126/378  lung]
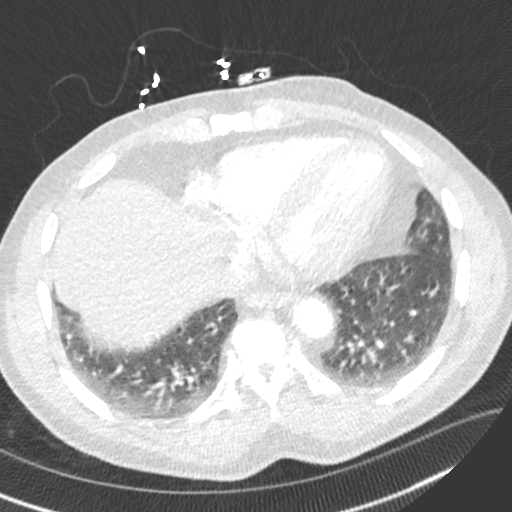
[im 147/378  soft-tissue]
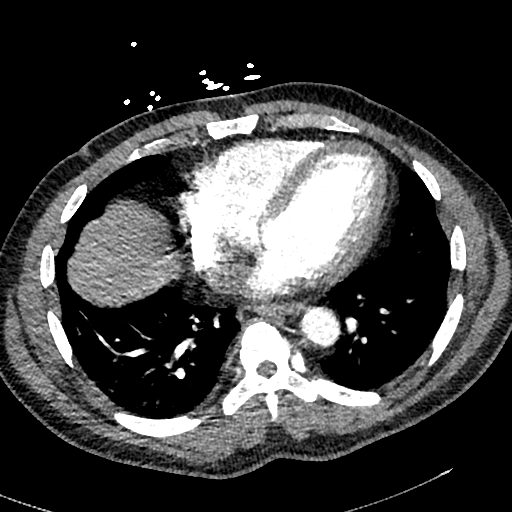
[im 168/378  lung]
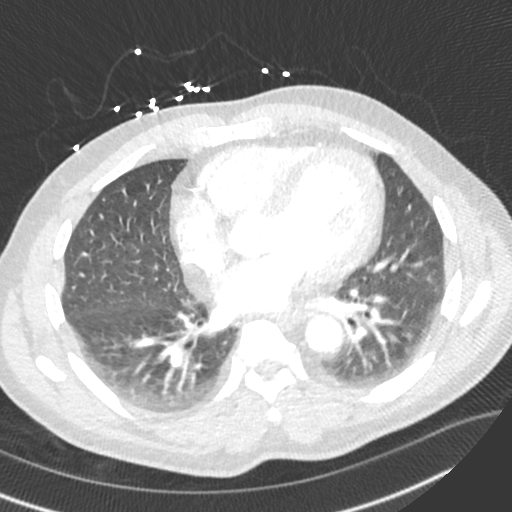
[im 189/378  soft-tissue]
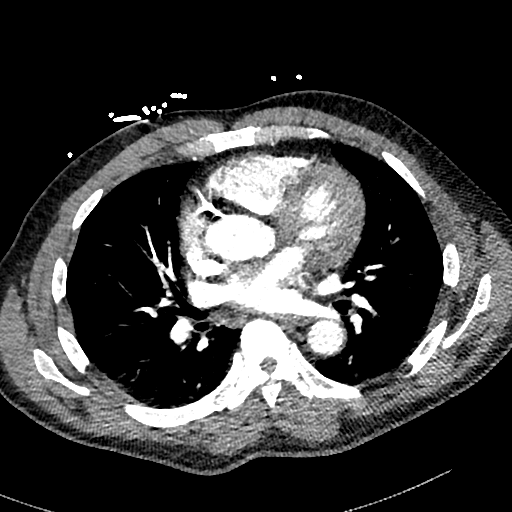
[im 210/378  lung]
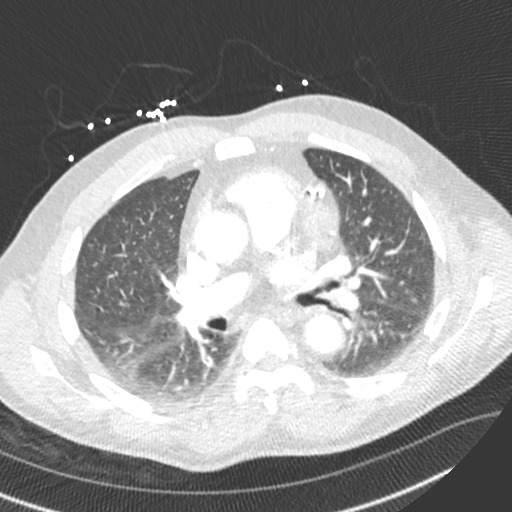
[im 231/378  soft-tissue]
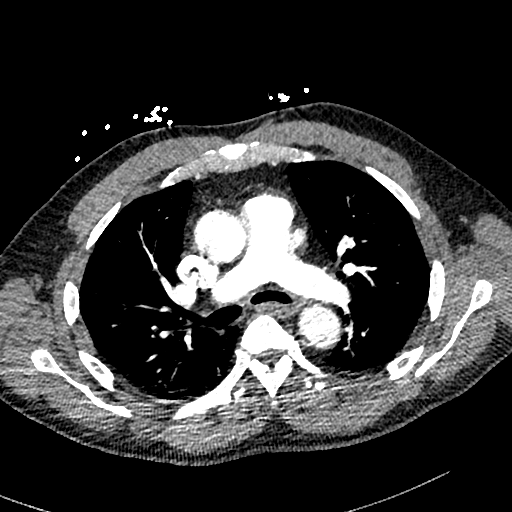
[im 252/378  lung]
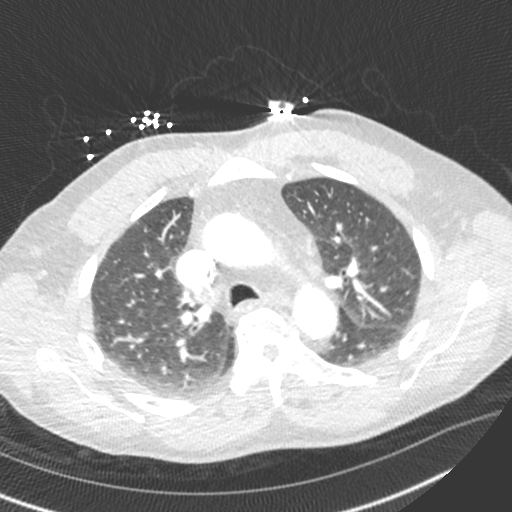
[im 294/378  soft-tissue]
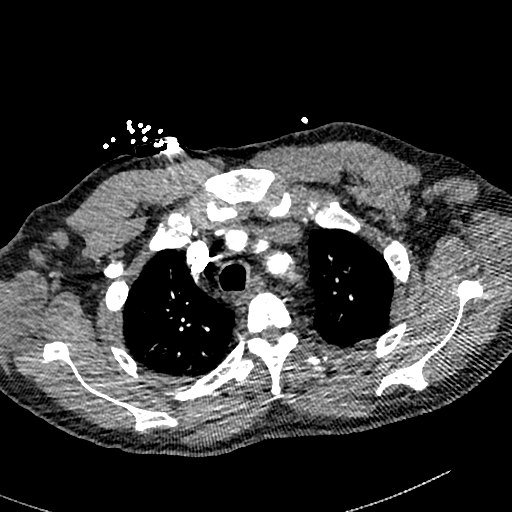
[im 315/378  lung]
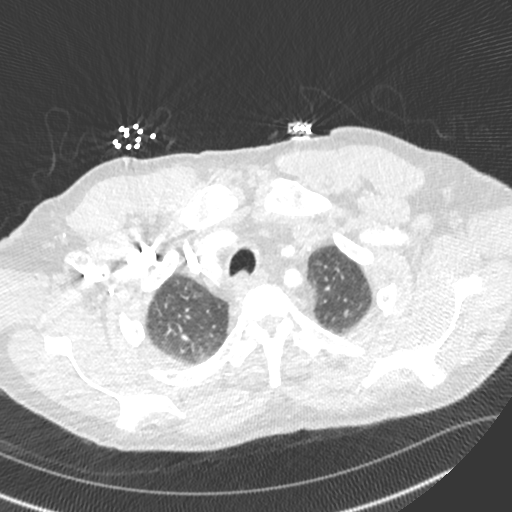
[im 336/378  soft-tissue]
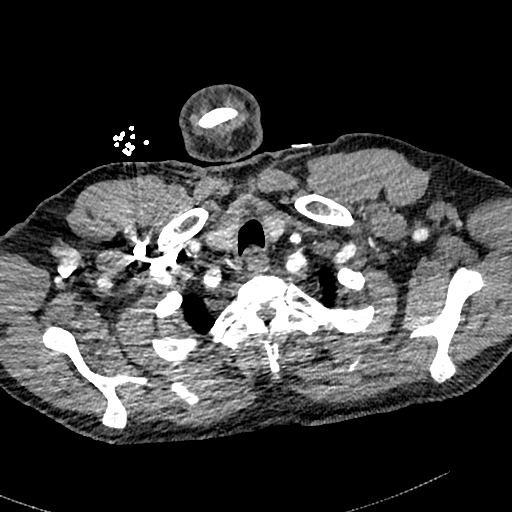
[im 357/378  lung]
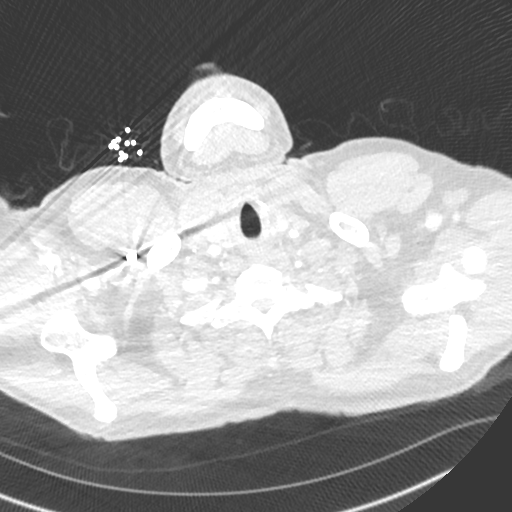

[Series 8: cor · coronal · 0.52mm/px · 3 of 151 slices shown]
[im 38/151  soft-tissue]
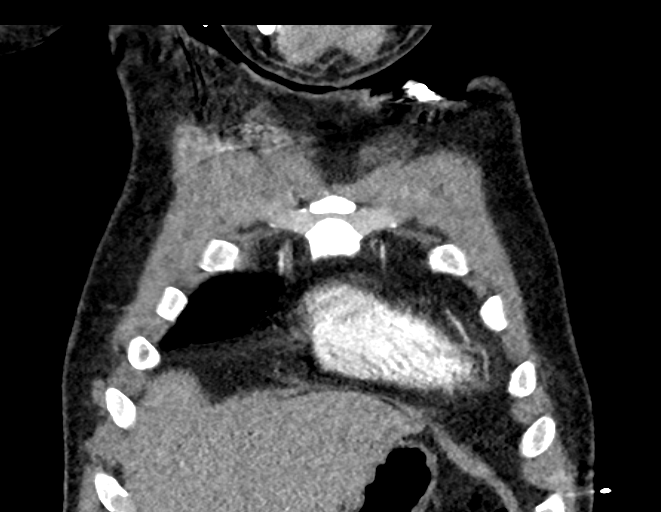
[im 76/151  soft-tissue]
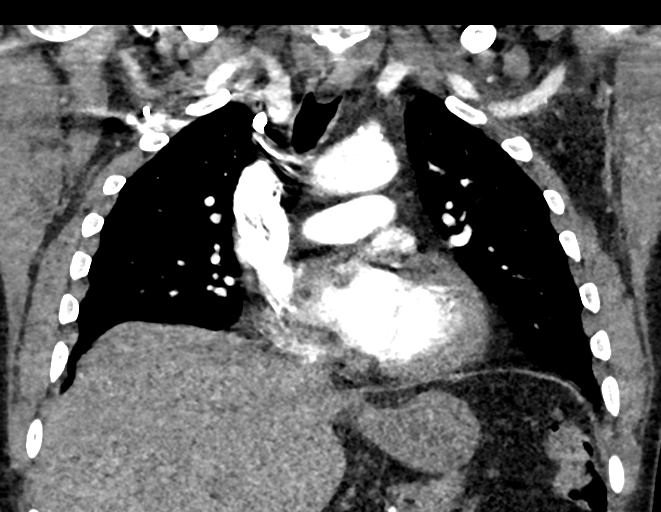
[im 113/151  soft-tissue]
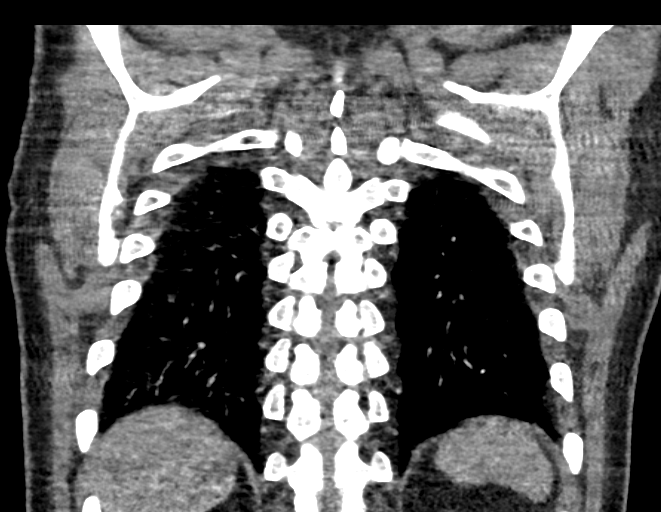

[18 of 46 positions shown; findings below may reference images not displayed]

FINDINGS: Cardiovascular: Satisfactory opacification of the pulmonary arteries
to the segmental level. No evidence of pulmonary embolism. Enlarged
heart size. No pericardial effusion. Coronary atherosclerosis.

Mediastinum/Nodes: Axillary adenopathy with adjacent calcifications
on the right, also seen in [DC]. The right-sided node is stable at
17 mm long axis. Left-sided dominant node is mildly larger than
before, of doubtful significance given the long interval.

Lungs/Pleura: There is no edema, consolidation, effusion, or
pneumothorax.

Upper Abdomen: Negative

Musculoskeletal: Spondylosis.

Review of the MIP images confirms the above findings.
IMPRESSION: 1. Negative for pulmonary embolism or other acute finding.
2. Coronary atherosclerosis.

## 2018-09-05 MED ORDER — RIVAROXABAN (XARELTO) VTE STARTER PACK (15 & 20 MG): ORAL_TABLET | ORAL | 0 refills | Status: DC

## 2018-09-05 MED ORDER — SODIUM CHLORIDE 0.9 % IV BOLUS
1000.0000 mL | Freq: Once | INTRAVENOUS | Status: AC
  Administered 2018-09-05: 1000 mL via INTRAVENOUS

## 2018-09-05 MED ORDER — RIVAROXABAN 15 MG PO TABS
15.0000 mg | ORAL_TABLET | Freq: Once | ORAL | Status: DC
  Filled 2018-09-05 (×2): qty 1

## 2018-09-05 MED ORDER — RIVAROXABAN 20 MG PO TABS: 20.0000 mg | ORAL_TABLET | Freq: Every day | ORAL | Status: DC

## 2018-09-05 MED ORDER — RIVAROXABAN 15 MG PO TABS
15.0000 mg | ORAL_TABLET | Freq: Two times a day (BID) | ORAL | Status: DC
  Administered 2018-09-05: 15 mg via ORAL
  Filled 2018-09-05: qty 1

## 2018-09-05 MED ORDER — IOPAMIDOL (ISOVUE-370) INJECTION 76%
INTRAVENOUS | Status: AC
  Filled 2018-09-05: qty 100

## 2018-09-05 MED ORDER — IOPAMIDOL (ISOVUE-370) INJECTION 76%
100.0000 mL | Freq: Once | INTRAVENOUS | Status: AC
  Administered 2018-09-05: 67 mL via INTRAVENOUS

## 2018-09-05 MED FILL — XARELTO STARTER PACK: 15 & 20 | 30 days supply | Qty: 51 | Fill #0

## 2018-09-05 NOTE — Progress Notes (Signed)
ANTICOAGULATION CONSULT NOTE - Initial Consult  Pharmacy Consult for Xarelto Indication: DVT  Patient Measurements: Height: 5\' 6"  (167.6 cm) Weight: 178 lb (80.7 kg) IBW/kg (Calculated) : 63.8   Vital Signs: Temp: 99.8 F (37.7 C) (10/04 0945) Temp Source: Oral (10/04 0945) BP: 110/72 (10/04 1030) Pulse Rate: 106 (10/04 1030)  Labs: Recent Labs    09/05/18 1016  HGB 12.5*  HCT 38.5*  PLT 156  CREATININE 1.31*     Medical History: Past Medical History:  Diagnosis Date  . Alcohol abuse   . Angina pectoris   . Arthritis   . Asthma   . Chronic lower back pain   . COPD (chronic obstructive pulmonary disease) (HCC)    on home O2 2L  . Diabetes mellitus without complication (HCC)   . Exertional shortness of breath   . Gout   . Hypertension   . Seizures [facility])     Assessment: 57 yo male presents to the ED with difficulty breathing and fever. Lower extremity dopplers were completed which was (+) for DVT in the R lower extremity.  hgb 12.5, plts wnl, SCr 1.3. Planning to do CTA to rule out PE. In the meantime, planning to initiate Xarelto.   Goal of Therapy:  Monitor platelets by anticoagulation protocol: Yes    Plan:  -Xarelto 15 mg po bid x 21 days followed by 20 mg po daily -Monitor s/sx bleeding   Baldemar Friday 09/05/2018,1:14 PM

## 2018-09-05 NOTE — ED Provider Notes (Signed)
[facility] EMERGENCY DEPARTMENT Provider Note   CSN: [id number] Arrival date & time: 09/05/18  8413     History   Chief Complaint Chief Complaint  Patient presents with  . Shortness of Breath    HPI Darryl Hurley is a 57 y.o. male.  Patient with history of alcohol abuse, COPD, gout presents the emergency department today with increased shortness of breath over the past several days.  Patient states that over the past 2 days he has had increased pain and swelling in his right foot that he states is from gout.  He has noted swelling and pain up to the thigh at times.  He denies any chest pain.  He has nonproductive cough.  Increase in wheezing.  He reports fever at home.  Patient had a "seizure" at home today.  Girlfriend at bedside states that when he has a seizure he stares and cannot talk.  He has these a lot.  He is not currently on any medications for seizures.  No reported history of blood clot.  Patient was given albuterol, Atrovent, Solu-Medrol by EMS prior to arrival which he reports helped.  The onset of this condition was acute. The course is constant. Aggravating factors: bearing weight. Alleviating factors: none.       Past Medical History:  Diagnosis Date  . Alcohol abuse   . Angina pectoris   . Arthritis   . Asthma   . Chronic lower back pain   . COPD (chronic obstructive pulmonary disease) (HCC)    on home O2 2L  . Diabetes mellitus without complication (HCC)   . Exertional shortness of breath   . Gout   . Hypertension   . Seizures [facility])     Patient Active Problem List   Diagnosis Date Noted  . Lactic acidosis 01/06/2016  . Lactic acid acidosis 01/06/2016  . Alcohol abuse with intoxication (HCC) 01/06/2016  . COPD with acute exacerbation (HCC) 02/02/2014  . HCAP (healthcare-associated pneumonia) 02/02/2014  . Increased anion gap metabolic acidosis 02/02/2014  . Cocaine abuse (HCC) 11/18/2013  . Fever 11/17/2013  . Anemia 11/17/2013   . Hypokalemia 11/17/2013  . Polyarthritis 11/17/2013  . Dehydration 11/17/2013  . Type 2 diabetes mellitus (HCC) 05/14/2013  . Chronic obstructive pulmonary disease (COPD) (HCC) 05/12/2013  . Gout 05/12/2013  . Headache 05/12/2013  . Asthma 05/04/2013  . TOBACCO ABUSE 04/27/2009  . Uncontrolled hypertension 04/27/2009    Past Surgical History:  Procedure Laterality Date  . NO PAST SURGERIES          Home Medications    Prior to Admission medications   Medication Sig Start Date End Date Taking? Authorizing Provider  albuterol (PROVENTIL HFA;VENTOLIN HFA) 108 (90 BASE) MCG/ACT inhaler Inhale 1 puff into the lungs every 6 (six) hours as needed for wheezing or shortness of breath.    [provider]  allopurinol (ZYLOPRIM) 100 MG tablet Take 100 mg by mouth daily.    [provider]  atenolol (TENORMIN) 100 MG tablet Take 1 tablet (100 mg total) by mouth every morning. Reported on 01/06/2016 01/08/16   Alison Murray, MD  atenolol (TENORMIN) 100 MG tablet Take 1 tablet (100 mg total) by mouth daily. 12/24/17   Street, [place], PA-C  colchicine 0.6 MG tablet Take one tablet by mouth every morning for 5 days beginning 12/25/17 12/24/17   Street, [place], PA-C  cyclobenzaprine (FLEXERIL) 10 MG tablet Take 1 tablet (10 mg total) by mouth 2 (two) times daily  as needed for muscle spasms. 04/26/18   McDonald, Mia A, PA-C  doxycycline (VIBRAMYCIN) 100 MG capsule Take 1 capsule (100 mg total) by mouth 2 (two) times daily. 08/19/18   Margarita Grizzle, MD  folic acid (FOLVITE) 1 MG tablet Take 1 tablet (1 mg total) by mouth daily. 01/08/16   Alison Murray, MD  HYDROcodone-acetaminophen [facility]) 5-325 MG tablet Take 1 tablet by mouth every 6 (six) hours as needed for severe pain. 12/24/17   Street, [place], PA-C  ibuprofen (ADVIL,MOTRIN) 800 MG tablet Take 1 tablet (800 mg total) by mouth every 8 (eight) hours as needed for mild pain. 08/23/17   Ward, Layla Maw, DO  losartan (COZAAR) 100 MG  tablet Take 1 tablet (100 mg total) by mouth every morning. Reported on 01/06/2016 01/08/16   Alison Murray, MD  losartan (COZAAR) 100 MG tablet Take 1 tablet (100 mg total) by mouth daily. 12/24/17   Street, [place], PA-C  meloxicam (MOBIC) 15 MG tablet Take 1 tablet (15 mg total) by mouth daily. 04/26/18   McDonald, Mia A, PA-C  metFORMIN (GLUCOPHAGE) 1000 MG tablet Take 1 tablet (1,000 mg total) by mouth 2 (two) times daily with a meal. 01/08/16   Alison Murray, MD  methocarbamol (ROBAXIN) 500 MG tablet Take 1 tablet (500 mg total) by mouth every 8 (eight) hours as needed for muscle spasms. 08/23/17   Ward, Layla Maw, DO  naproxen (NAPROSYN) 500 MG tablet Take 1 tablet (500 mg total) by mouth 2 (two) times daily as needed for mild pain, moderate pain or headache (TAKE WITH MEALS.). 12/24/17   Street, [place], PA-C  predniSONE (DELTASONE) 10 MG tablet Take 2 tablets (20 mg total) by mouth 2 (two) times daily. 08/19/18   Margarita Grizzle, MD  rosuvastatin (CRESTOR) 40 MG tablet Take 40 mg by mouth daily.    [provider]  thiamine 100 MG tablet Take 1 tablet (100 mg total) by mouth daily. 01/08/16   Alison Murray, MD    Family History Family History  Problem Relation Age of Onset  . Diabetes type II Mother   . Depression Father   . Suicidality Father   . Asthma Brother     Social History Social History   Tobacco Use  . Smoking status: Current Every Day Smoker    Packs/day: 0.50    Years: 40.00    Pack years: 20.00    Types: Cigarettes  . Smokeless tobacco: Never Used  . Tobacco comment: 05/12/2013 'eased off smoking since last month"  Substance Use Topics  . Alcohol use: Yes    Comment: 4 40 oz beers per day  . Drug use: No    Types: "Crack" cocaine     Allergies   Nsaids and Toradol [ketorolac tromethamine]   Review of Systems Review of Systems  Constitutional: Negative for fever.  HENT: Negative for rhinorrhea and sore throat.   Eyes: Negative for redness.    Respiratory: Positive for cough, shortness of breath and wheezing.   Cardiovascular: Positive for chest pain and leg swelling.  Gastrointestinal: Negative for abdominal pain, diarrhea, nausea and vomiting.  Genitourinary: Negative for dysuria.  Musculoskeletal: Positive for arthralgias. Negative for myalgias.  Skin: Negative for rash.  Neurological: Negative for headaches.     Physical Exam Updated Vital Signs BP 123/74 (BP Location: Right Arm)   Pulse (!) 116   Temp 99.8 F (37.7 C) (Oral)   Resp (!) 21   Ht 5\' 6"  (1.676 m)  Wt 80.7 kg   SpO2 95%   BMI 28.73 kg/m   Physical Exam  Constitutional: He appears well-developed and well-nourished.  HENT:  Head: Normocephalic and atraumatic.  Eyes: Conjunctivae are normal. Right eye exhibits no discharge. Left eye exhibits no discharge.  Neck: Normal range of motion. Neck supple.  Cardiovascular: Normal rate, regular rhythm and normal heart sounds.  Pulmonary/Chest: Effort normal. He has wheezes (Mild, scattered). He has no rhonchi.  Abdominal: Soft. There is no tenderness.  Musculoskeletal:       Right lower leg: He exhibits tenderness. He exhibits no edema.       Left lower leg: He exhibits no tenderness and no edema.  Patient with no obvious lower extremity swelling however prominent veins noted on the right more so when compared to the left.  Patient has pain along the medial aspect of his foot.  No focal MTP pain.  Neurological: He is alert.  Skin: Skin is warm and dry.  Psychiatric: He has a normal mood and affect.  Nursing note and vitals reviewed.    ED Treatments / Results  Labs (all labs ordered are listed, but only abnormal results are displayed) Labs Reviewed  CBC WITH DIFFERENTIAL/PLATELET - Abnormal; Notable for the following components:      Result Value   WBC 14.1 (*)    RBC 4.19 (*)    Hemoglobin 12.5 (*)    HCT 38.5 (*)    RDW 16.1 (*)    Neutro Abs 12.0 (*)    All other components within normal  limits  BASIC METABOLIC PANEL - Abnormal; Notable for the following components:   CO2 20 (*)    Glucose, Bld 203 (*)    BUN 21 (*)    Creatinine, Ser 1.31 (*)    GFR calc non Af Amer 59 (*)    All other components within normal limits  URINALYSIS, ROUTINE W REFLEX MICROSCOPIC    ED ECG REPORT   Date: 09/05/2018  Rate: 111  Rhythm: normal sinus rhythm  QRS Axis: normal  Intervals: normal  ST/T Wave abnormalities: nonspecific T wave changes  Conduction Disutrbances:none  Narrative Interpretation:   Old EKG Reviewed: unchanged  I have personally reviewed the EKG tracing and agree with the computerized printout as noted.  Radiology Dg Chest 2 View  Result Date: 09/05/2018 CLINICAL DATA:  Cough with chest pain and shortness of breath EXAM: CHEST - 2 VIEW COMPARISON:  August 19, 2018 FINDINGS: There is no edema or consolidation. Heart is upper normal in size with pulmonary vascularity normal. No adenopathy. No pneumothorax. No bone lesions. IMPRESSION: No edema or consolidation.  Stable cardiac silhouette. Electronically Signed   By: Bretta Bang III M.D.   On: 09/05/2018 10:35    Procedures Procedures (including critical care time)  Medications Ordered in ED Medications  Rivaroxaban (XARELTO) tablet 15 mg (15 mg Oral Given 09/05/18 1421)    Followed by  rivaroxaban (XARELTO) tablet 20 mg (has no administration in time range)  iopamidol (ISOVUE-370) 76 % injection (has no administration in time range)  sodium chloride 0.9 % bolus 1,000 mL (0 mLs Intravenous Stopped 09/05/18 1108)  iopamidol (ISOVUE-370) 76 % injection 100 mL (67 mLs Intravenous Contrast Given 09/05/18 1404)     Initial Impression / Assessment and Plan / ED Course  I have reviewed the triage vital signs and the nursing notes.  Pertinent labs & imaging results that were available during my care of the patient were reviewed by me and considered  in my medical decision making (see chart for details).      Patient seen and examined. Work-up initiated. Medications ordered.  Patient may have gout, however he will need evaluated for infection and his exam is not clearly consistent with gouty arthritis.  Will get right lower extremity venous Doppler ultrasound to ensure no blood clot.  Vital signs reviewed and are as follows: BP 123/74 (BP Location: Right Arm)   Pulse (!) 116   Temp 99.8 F (37.7 C) (Oral)   Resp (!) 21   Ht 5\' 6"  (1.676 m)   Wt 80.7 kg   SpO2 95%   BMI 28.73 kg/m   12:47 PM Acute DVT noted on doppler.  Patient has had some shortness of breath and overall appearance is not definitively suggestive of COPD today.  He has a vague chest pain that is intermittent.  Will need to rule out PE.  3:13 PM patient given first dose of Xarelto here for his DVT.  CT imaging was performed and is negative for PE or pneumonia.  Patient counseled on side effects of the anticoagulant and discussed need for very close primary care follow-up.  Discussed need to return with worsening chest pain or shortness of breath.  Patient will have prescription filled here in the transitions of care pharmacy prior to discharge.   Final Clinical Impressions(s) / ED Diagnoses   Final diagnoses:  Acute deep vein thrombosis (DVT) of right peroneal vein  Shortness of breath   Shortness of breath: Patient with history of COPD and he was given breathing treatment and Solu-Medrol in route.  Patient has not been wheezing in the emergency department today.  No fever or signs of pneumonia on imaging.  EKG is nonischemic.  PE was evaluated for using CT, ruled out.  Patient to continue home controller medications for COPD.  Heart rate improved and oxygen level remains normal during ED stay.  Leukocytosis noted.  As no infection identified on exam, this is likely due to demargination from administration of Solu-Medrol.  Right lower extremity pain and swelling: Patient positive for peroneal DVT.  CTA negative for PE.   Patient started on Xarelto today and was given first dose and has filled medication here.  He was concerned about gout however no clinical signs or symptoms of gout on exam.  Pain is likely related to his newly diagnosed DVT.  ED Discharge Orders         Ordered    Rivaroxaban 15 & 20 MG TBPK  Status:  Discontinued     09/05/18 1508    Rivaroxaban 15 & 20 MG TBPK  Status:  Discontinued     09/05/18 1512    Rivaroxaban 15 & 20 MG TBPK     09/05/18 1513           Renne Crigler, PA-C 09/05/18 1518    Wynetta Fines, MD 09/06/18 1150

## 2018-09-05 NOTE — Discharge Instructions (Signed)
Please read and follow all provided instructions.  Your diagnoses today include:  1. Acute deep vein thrombosis (DVT) of right peroneal vein   2. Shortness of breath     Tests performed today include:  An ultrasound of your leg that shows a blood clot in the right calf  Blood counts and electrolytes  CT of your chest that did not show any signs of infection or blood clots in the lungs.  Vital signs. See below for your results today.   Medications prescribed:   Xarelto - medication for blood clots  Take any prescribed medications only as directed.  Home care instructions:  Follow any educational materials contained in this packet.  Follow-up instructions: Please follow-up with your primary care provider in the next 1-2 days for further evaluation of your symptoms. This is very important for the management of your medications.   Return instructions:   Please return to the Emergency Department if you experience worsening symptoms.  Return immediately if you develop chest pain, shortness of breath, dizziness or fainting.  Return with new color change or weakness/numbness to your affected leg(s).  Return with bleeding, severe headache, confusion or altered mental status.  Please return if you have any other emergent concerns.  Additional Information:  Your vital signs today were: BP 119/85    Pulse 99    Temp 99.8 F (37.7 C) (Oral)    Resp 17    Ht 5\' 6"  (1.676 m)    Wt 80.7 kg    SpO2 100%    BMI 28.73 kg/m  If your blood pressure (BP) was elevated above 135/85 this visit, please have this repeated by your doctor within one month. --------------

## 2018-09-05 NOTE — Progress Notes (Signed)
Preliminary notes-- Right lower extremity venous duplex exam completed. Positive for acute DVT involving right right peroneal vein, and right posterior tibial vein at calf region.  Incidental findings: Common femoral and femoral arteries calcifications noticed.  Hongying Richardson Dopp (RDMS RVT) 09/05/18 12:22 PM

## 2018-09-05 NOTE — ED Notes (Signed)
Patient transported to X-ray 

## 2018-09-05 NOTE — ED Notes (Signed)
Pt taken to lobby to wait for meds and education from pharmacy. Pharmacy aware and will find pt in lobby.

## 2018-09-05 NOTE — ED Triage Notes (Signed)
Pt arrives to ED from home with complaints of difficulty breathing and fever x4 days. EMS reports pt was given 10 albuterol, 1 atrovent, and 125 solumedrol en route. Per family, pt has hx of seizures and had one today, no meds presribed for such. Pt complaining of chest tightness with respirations, ems gave 324 asa. Pt placed in position of comfort with bed locked and lowered, call bell in reach.

## 2018-09-05 NOTE — ED Notes (Signed)
Pt reminded of need for urine sample.  

## 2018-09-05 NOTE — ED Notes (Signed)
Patient transported to Ultrasound 

## 2018-09-05 NOTE — ED Notes (Signed)
PA notified of +DVT  Orders received, will continue to monitor.

## 2018-09-05 NOTE — ED Notes (Signed)
Pt provided urinal, aware that urine sample is needed.  

## 2018-09-05 NOTE — ED Notes (Signed)
Patient verbalizes understanding of discharge instructions. Opportunity for questioning and answers were provided. Armband removed by staff, pt discharged from ED ambulatory to home with discharge paperwork.

## 2018-10-22 ENCOUNTER — Emergency Department (HOSPITAL_COMMUNITY)
Admission: EM | Admit: 2018-10-22 | Discharge: 2018-10-22 | Disposition: A | Discharge status: Home / Self Care | Payer: Medicaid Other | Attending: Emergency Medicine | Admitting: Emergency Medicine

## 2018-10-22 ENCOUNTER — Encounter (HOSPITAL_COMMUNITY): Payer: Self-pay | Admitting: *Deleted

## 2018-10-22 ENCOUNTER — Emergency Department (HOSPITAL_COMMUNITY): Payer: Medicaid Other

## 2018-10-22 ENCOUNTER — Inpatient Hospital Stay (HOSPITAL_BASED_OUTPATIENT_CLINIC_OR_DEPARTMENT_OTHER): Payer: Medicaid Other

## 2018-10-22 ENCOUNTER — Other Ambulatory Visit: Payer: Self-pay

## 2018-10-22 DIAGNOSIS — J449 Chronic obstructive pulmonary disease, unspecified: Secondary | ICD-10-CM | POA: Diagnosis not present

## 2018-10-22 DIAGNOSIS — R52 Pain, unspecified: Secondary | ICD-10-CM

## 2018-10-22 DIAGNOSIS — J45909 Unspecified asthma, uncomplicated: Secondary | ICD-10-CM | POA: Diagnosis not present

## 2018-10-22 DIAGNOSIS — Z79899 Other long term (current) drug therapy: Secondary | ICD-10-CM | POA: Diagnosis not present

## 2018-10-22 DIAGNOSIS — M79606 Pain in leg, unspecified: Secondary | ICD-10-CM | POA: Diagnosis not present

## 2018-10-22 DIAGNOSIS — R072 Precordial pain: Secondary | ICD-10-CM | POA: Diagnosis not present

## 2018-10-22 DIAGNOSIS — R05 Cough: Secondary | ICD-10-CM | POA: Diagnosis not present

## 2018-10-22 DIAGNOSIS — F17210 Nicotine dependence, cigarettes, uncomplicated: Secondary | ICD-10-CM | POA: Insufficient documentation

## 2018-10-22 DIAGNOSIS — R079 Chest pain, unspecified: Secondary | ICD-10-CM | POA: Diagnosis present

## 2018-10-22 DIAGNOSIS — Z7984 Long term (current) use of oral hypoglycemic drugs: Secondary | ICD-10-CM | POA: Insufficient documentation

## 2018-10-22 DIAGNOSIS — E119 Type 2 diabetes mellitus without complications: Secondary | ICD-10-CM | POA: Diagnosis not present

## 2018-10-22 DIAGNOSIS — R0602 Shortness of breath: Secondary | ICD-10-CM | POA: Diagnosis not present

## 2018-10-22 DIAGNOSIS — Z7901 Long term (current) use of anticoagulants: Secondary | ICD-10-CM | POA: Insufficient documentation

## 2018-10-22 LAB — COMPREHENSIVE METABOLIC PANEL
ALT: 13 U/L (ref 0–44)
AST: 19 U/L (ref 15–41)
Albumin: 3.5 g/dL (ref 3.5–5.0)
Alkaline Phosphatase: 58 U/L (ref 38–126)
Anion gap: 8 (ref 5–15)
BUN: 16 mg/dL (ref 6–20)
CHLORIDE: 105 mmol/L (ref 98–111)
CO2: 26 mmol/L (ref 22–32)
Calcium: 9.7 mg/dL (ref 8.9–10.3)
Creatinine, Ser: 1.22 mg/dL (ref 0.61–1.24)
Glucose, Bld: 140 mg/dL — ABNORMAL HIGH (ref 70–99)
POTASSIUM: 4.8 mmol/L (ref 3.5–5.1)
Sodium: 139 mmol/L (ref 135–145)
Total Bilirubin: 0.7 mg/dL (ref 0.3–1.2)
Total Protein: 8 g/dL (ref 6.5–8.1)

## 2018-10-22 LAB — CBC WITH DIFFERENTIAL/PLATELET
Abs Immature Granulocytes: 0.02 10*3/uL (ref 0.00–0.07)
BASOS ABS: 0.1 10*3/uL (ref 0.0–0.1)
Basophils Relative: 1 %
EOS PCT: 1 %
Eosinophils Absolute: 0.1 10*3/uL (ref 0.0–0.5)
HCT: 45.9 % (ref 39.0–52.0)
HEMOGLOBIN: 14.2 g/dL (ref 13.0–17.0)
IMMATURE GRANULOCYTES: 0 %
LYMPHS PCT: 22 %
Lymphs Abs: 1.6 10*3/uL (ref 0.7–4.0)
MCH: 28.1 pg (ref 26.0–34.0)
MCHC: 30.9 g/dL (ref 30.0–36.0)
MCV: 90.9 fL (ref 80.0–100.0)
Monocytes Absolute: 0.8 10*3/uL (ref 0.1–1.0)
Monocytes Relative: 11 %
NEUTROS PCT: 65 %
NRBC: 0 % (ref 0.0–0.2)
Neutro Abs: 4.6 10*3/uL (ref 1.7–7.7)
Platelets: 478 10*3/uL — ABNORMAL HIGH (ref 150–400)
RBC: 5.05 MIL/uL (ref 4.22–5.81)
RDW: 16.9 % — ABNORMAL HIGH (ref 11.5–15.5)
WBC: 7.1 10*3/uL (ref 4.0–10.5)

## 2018-10-22 LAB — URINALYSIS, ROUTINE W REFLEX MICROSCOPIC
Bilirubin Urine: NEGATIVE
GLUCOSE, UA: NEGATIVE mg/dL
Hgb urine dipstick: NEGATIVE
KETONES UR: NEGATIVE mg/dL
LEUKOCYTES UA: NEGATIVE
Nitrite: NEGATIVE
PROTEIN: NEGATIVE mg/dL
Specific Gravity, Urine: 1.012 (ref 1.005–1.030)
pH: 7 (ref 5.0–8.0)

## 2018-10-22 LAB — I-STAT TROPONIN, ED
TROPONIN I, POC: 0 ng/mL (ref 0.00–0.08)
Troponin i, poc: 0 ng/mL (ref 0.00–0.08)

## 2018-10-22 LAB — RAPID URINE DRUG SCREEN, HOSP PERFORMED
Amphetamines: NOT DETECTED
BENZODIAZEPINES: NOT DETECTED
Barbiturates: NOT DETECTED
Cocaine: NOT DETECTED
Opiates: NOT DETECTED
Tetrahydrocannabinol: NOT DETECTED

## 2018-10-22 IMAGING — CT CT ANGIO CHEST
2 of 8 series · 18 of 46 positions shown · IV contrast (isovue)
Comparison: [DATE] CT and prior studies

CLINICAL DATA: 57-year-old male with acute chest pain and shortness
of breath.

EXAM:
CT ANGIOGRAPHY CHEST WITH CONTRAST
TECHNIQUE: Multidetector CT imaging of the chest was performed using the
standard protocol during bolus administration of intravenous
contrast. Multiplanar CT image reconstructions and MIPs were
obtained to evaluate the vascular anatomy.
CONTRAST:  70 cc intravenous Isovue 370

[Series 6: thins · axial · 0.80mm/px · z∈[+1304,+1526]mm · 15 of 246 slices shown]
[im 12/246  lung]
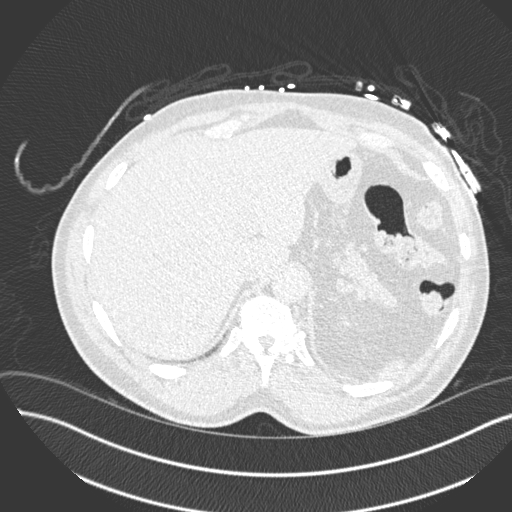
[im 34/246  soft-tissue]
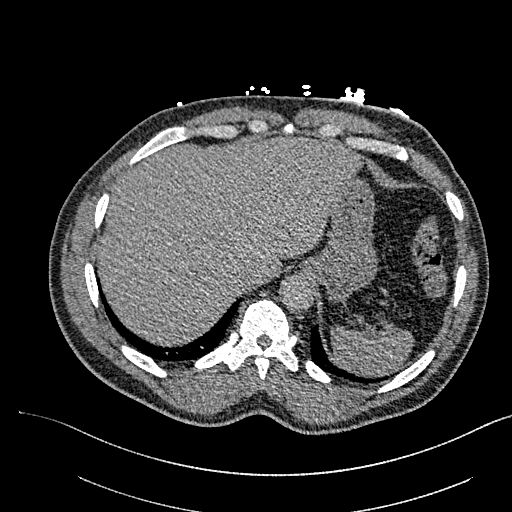
[im 45/246  lung]
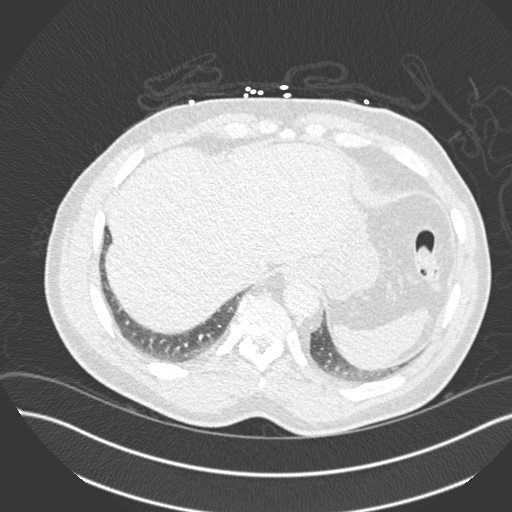
[im 56/246  soft-tissue]
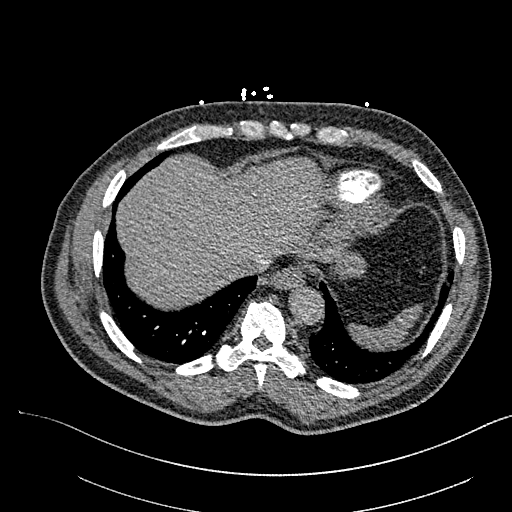
[im 78/246  lung]
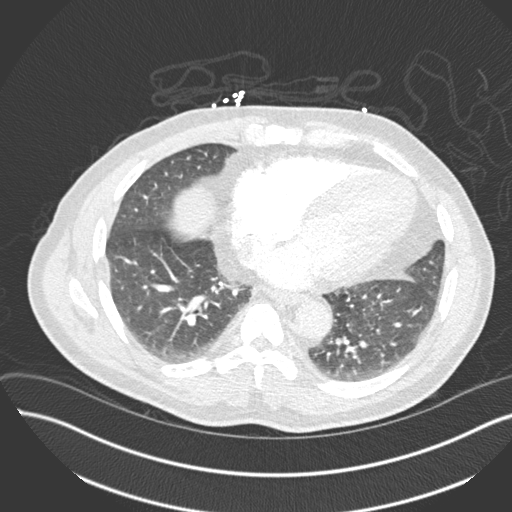
[im 90/246  soft-tissue]
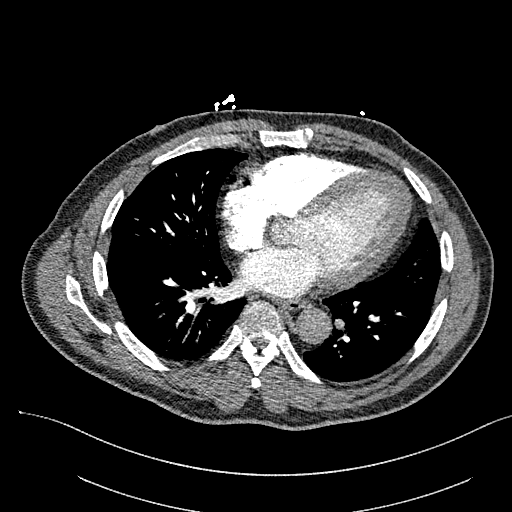
[im 112/246  lung]
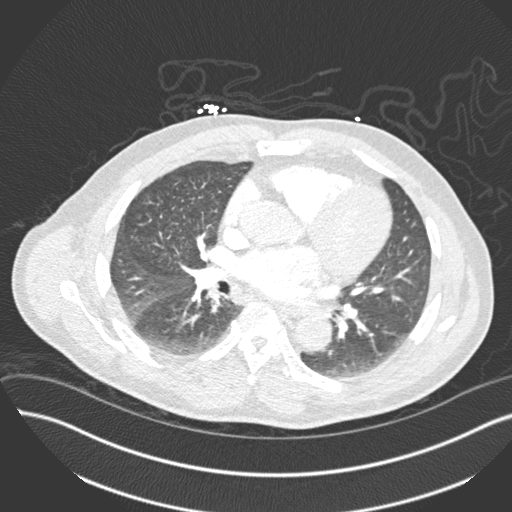
[im 123/246  soft-tissue]
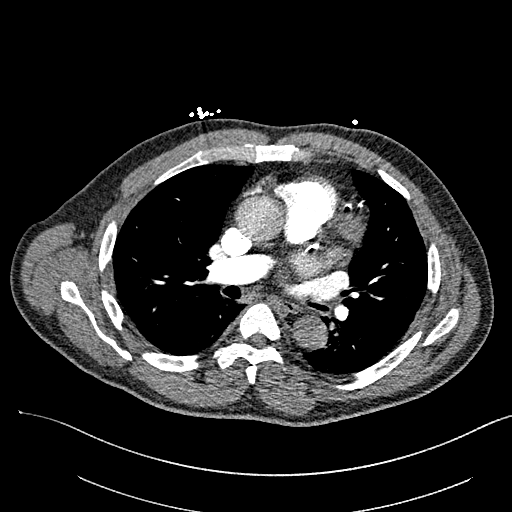
[im 134/246  lung]
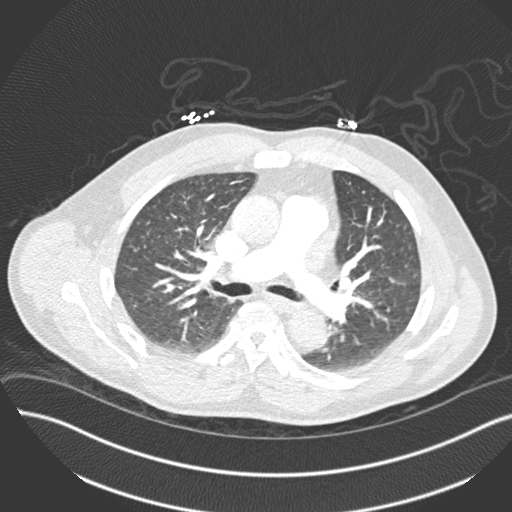
[im 156/246  soft-tissue]
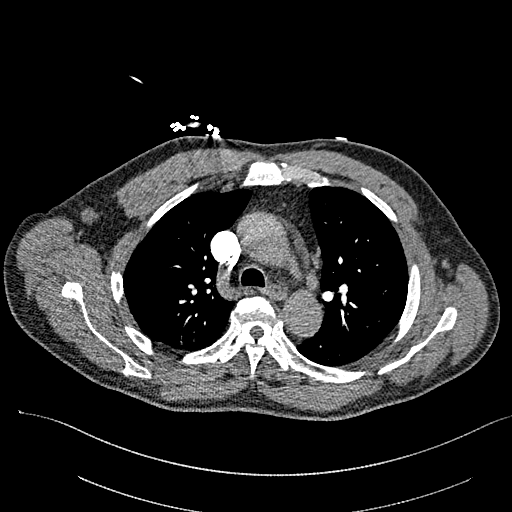
[im 168/246  lung]
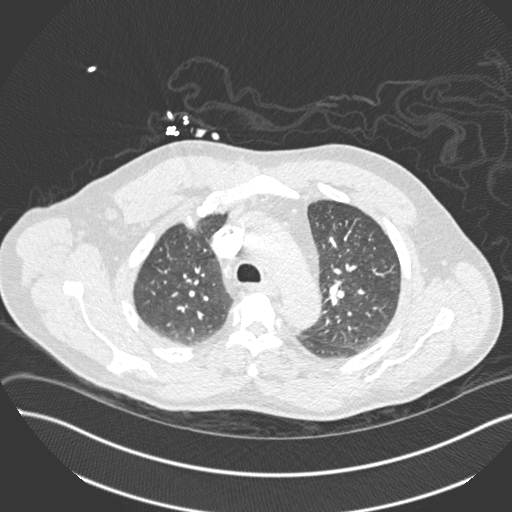
[im 190/246  soft-tissue]
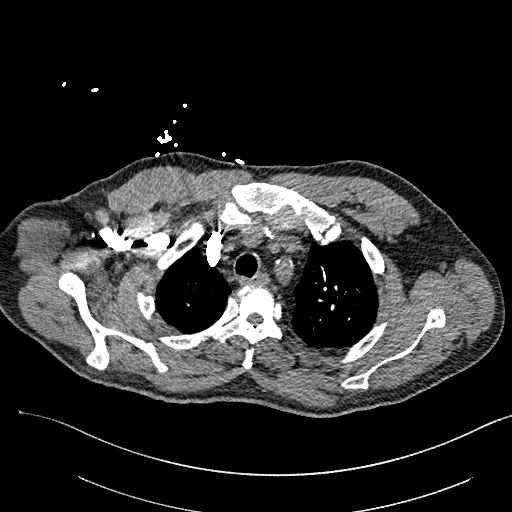
[im 201/246  lung]
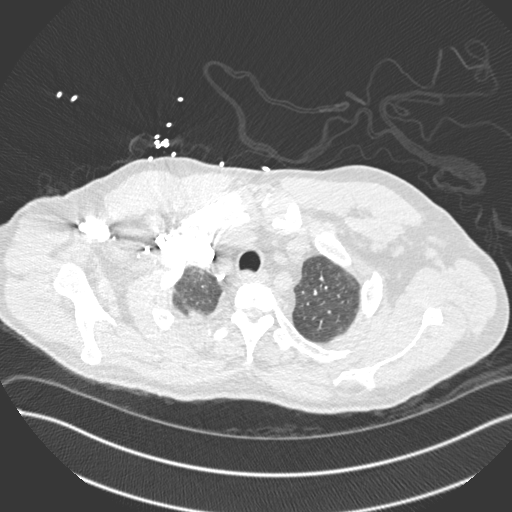
[im 212/246  soft-tissue]
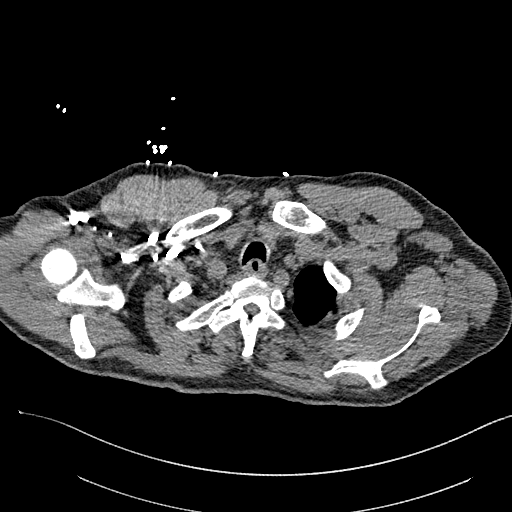
[im 234/246  lung]
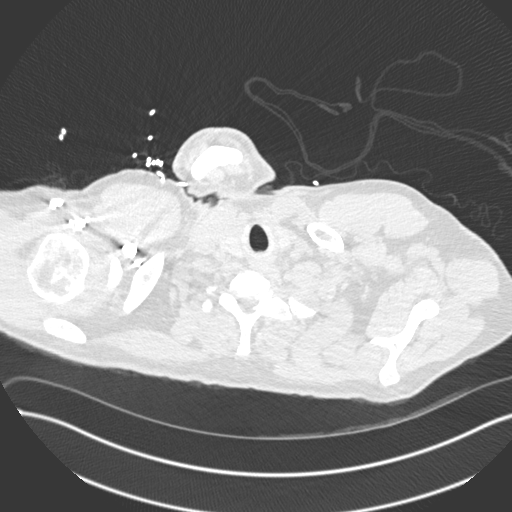

[Series 8: coronal mpr · coronal · 0.50mm/px · 3 of 151 slices shown]
[im 38/151  soft-tissue]
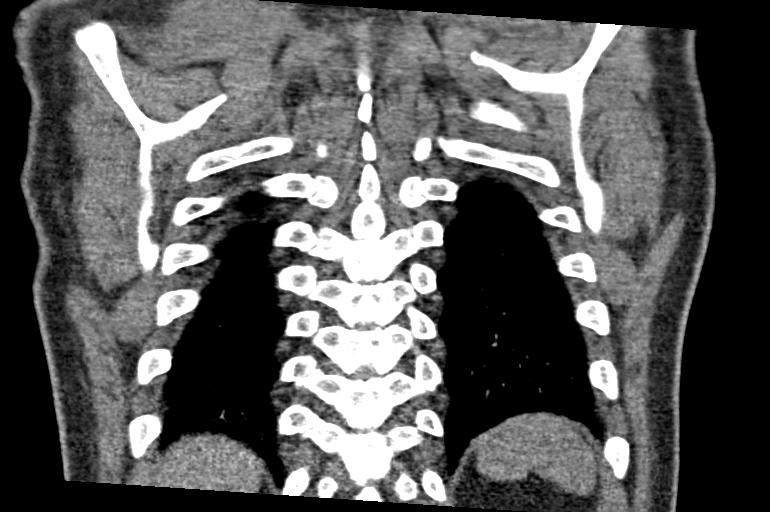
[im 76/151  soft-tissue]
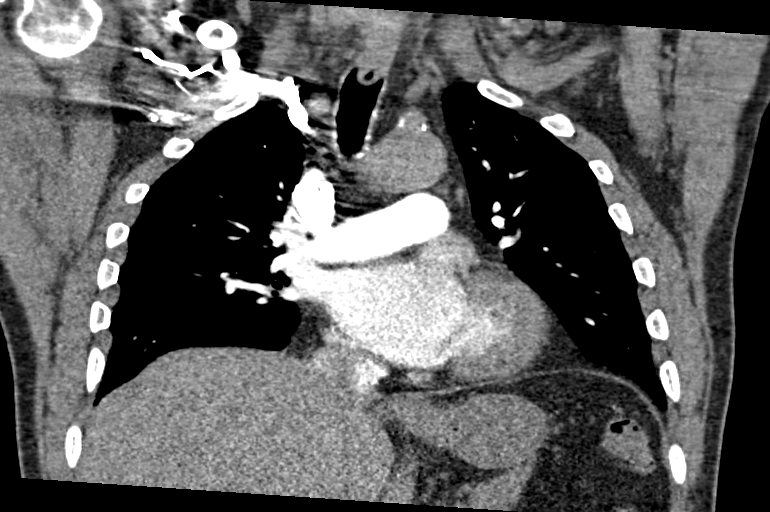
[im 113/151  soft-tissue]
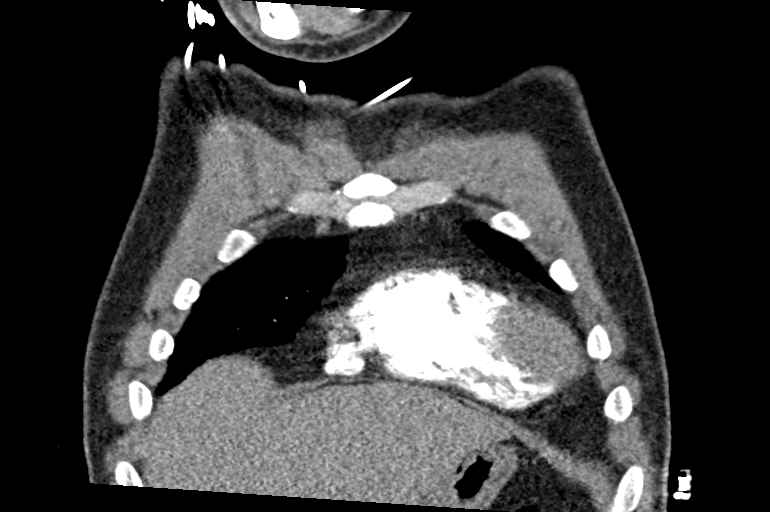

[18 of 46 positions shown; findings below may reference images not displayed]

FINDINGS: Cardiovascular: This is a technically satisfactory study for
evaluation of pulmonary emboli. No pulmonary emboli are identified.

Cardiomegaly, and coronary artery and aortic atherosclerotic
calcifications again noted. There is no evidence of thoracic aortic
aneurysm or pericardial effusion.

Mediastinum/Nodes: Mildly prominent bilateral axillary lymph nodes
are unchanged from [Q6]. No new or enlarging lymph nodes or
mediastinal mass identified. The visualized thyroid gland and
esophagus are unremarkable.

Lungs/Pleura: No airspace disease, consolidation, mass, suspicious
nodule, pleural effusion or pneumothorax. A few tiny bilateral
pulmonary nodules are unchanged from [Q6].

Upper Abdomen: No acute abnormality

Musculoskeletal: No acute or suspicious abnormality.

Review of the MIP images confirms the above findings.
IMPRESSION: 1. No evidence of acute abnormality. No evidence of pulmonary
emboli.
2. Cardiomegaly and coronary artery disease
3.  Aortic Atherosclerosis ([Q6]-[Q6]).

## 2018-10-22 MED ORDER — IOPAMIDOL (ISOVUE-370) INJECTION 76%
100.0000 mL | Freq: Once | INTRAVENOUS | Status: AC
  Administered 2018-10-22: 70 mL via INTRAVENOUS

## 2018-10-22 MED ORDER — IOPAMIDOL (ISOVUE-370) INJECTION 76%
INTRAVENOUS | Status: AC
  Filled 2018-10-22: qty 100

## 2018-10-22 NOTE — ED Notes (Addendum)
Patient transported to Vascular Lab at this time, Afterwards he will be transported to CT.

## 2018-10-22 NOTE — ED Provider Notes (Signed)
[facility] EMERGENCY DEPARTMENT Provider Note   CSN: [id number] Arrival date & time: 10/22/18  4098     History   Chief Complaint Chief Complaint  Patient presents with  . Abdominal Pain  chest pain   HPI Darryl Hurley is a 57 y.o. male.  The history is provided by the patient and medical records. No language interpreter was used.  Chest Pain   This is a new problem. The current episode started yesterday. The problem occurs constantly. The problem has not changed since onset.The pain is associated with exertion and breathing. The pain is present in the substernal region and lateral region. The pain is at a severity of 5/10. The pain is moderate. The quality of the pain is described as exertional, sharp and pleuritic. The pain does not radiate. Duration of episode(s) is 2 days. The symptoms are aggravated by deep breathing and exertion. Associated symptoms include hemoptysis, leg pain and shortness of breath. Pertinent negatives include no abdominal pain, no back pain, no cough, no diaphoresis, no exertional chest pressure, no fever, no headaches, no irregular heartbeat, no malaise/fatigue, no nausea, no near-syncope, no numbness, no palpitations, no sputum production, no syncope, no vomiting and no weakness. He has tried nothing for the symptoms. The treatment provided no relief. Risk factors include male gender.    Past Medical History:  Diagnosis Date  . Alcohol abuse   . Angina pectoris   . Arthritis   . Asthma   . Chronic lower back pain   . COPD (chronic obstructive pulmonary disease) (HCC)    on home O2 2L  . Diabetes mellitus without complication (HCC)   . Exertional shortness of breath   . Gout   . Hypertension   . Seizures [facility])     Patient Active Problem List   Diagnosis Date Noted  . Lactic acidosis 01/06/2016  . Lactic acid acidosis 01/06/2016  . Alcohol abuse with intoxication (HCC) 01/06/2016  . COPD with acute exacerbation (HCC)  02/02/2014  . HCAP (healthcare-associated pneumonia) 02/02/2014  . Increased anion gap metabolic acidosis 02/02/2014  . Cocaine abuse (HCC) 11/18/2013  . Fever 11/17/2013  . Anemia 11/17/2013  . Hypokalemia 11/17/2013  . Polyarthritis 11/17/2013  . Dehydration 11/17/2013  . Type 2 diabetes mellitus (HCC) 05/14/2013  . Chronic obstructive pulmonary disease (COPD) (HCC) 05/12/2013  . Gout 05/12/2013  . Headache 05/12/2013  . Asthma 05/04/2013  . TOBACCO ABUSE 04/27/2009  . Uncontrolled hypertension 04/27/2009    Past Surgical History:  Procedure Laterality Date  . NO PAST SURGERIES          Home Medications    Prior to Admission medications   Medication Sig Start Date End Date Taking? Authorizing Provider  albuterol (PROVENTIL HFA;VENTOLIN HFA) 108 (90 BASE) MCG/ACT inhaler Inhale 1 puff into the lungs every 6 (six) hours as needed for wheezing or shortness of breath.    [provider]  allopurinol (ZYLOPRIM) 100 MG tablet Take 100 mg by mouth daily.    [provider]  amLODipine (NORVASC) 10 MG tablet Take 10 mg by mouth daily. 08/06/18   [provider]  cloNIDine (CATAPRES) 0.1 MG tablet Take 0.1 mg by mouth 2 (two) times daily. 08/15/18   [provider]  colchicine 0.6 MG tablet Take one tablet by mouth every morning for 5 days beginning 12/25/17 12/24/17   Street, [place], PA-C  cyclobenzaprine (FLEXERIL) 10 MG tablet Take 1 tablet (10 mg total) by mouth 2 (two) times daily as  needed for muscle spasms. 04/26/18   McDonald, Mia A, PA-C  FLOVENT HFA 110 MCG/ACT inhaler Inhale 1 puff into the lungs 2 (two) times daily. 08/06/18   [provider]  fluticasone (FLONASE) 50 MCG/ACT nasal spray Place 1 spray into both nostrils daily. 08/06/18   [provider]  hydrochlorothiazide (HYDRODIURIL) 25 MG tablet Take 25 mg by mouth daily.  07/14/18   [provider]  HYDROcodone-acetaminophen (NORCO) 5-325 MG tablet Take 1 tablet  by mouth every 6 (six) hours as needed for severe pain. Patient not taking: Reported on 09/05/2018 12/24/17   Street, [place], PA-C  losartan (COZAAR) 100 MG tablet Take 1 tablet (100 mg total) by mouth daily. 12/24/17   Street, [place], PA-C  metFORMIN (GLUCOPHAGE) 1000 MG tablet Take 1 tablet (1,000 mg total) by mouth 2 (two) times daily with a meal. 01/08/16   Alison Murray, MD  methocarbamol (ROBAXIN) 500 MG tablet Take 1 tablet (500 mg total) by mouth every 8 (eight) hours as needed for muscle spasms. 08/23/17   Ward, Layla Maw, DO  Rivaroxaban 15 & 20 MG TBPK Take as directed on package: Start with one 15mg  tablet by mouth twice a day with food. On Day 22, switch to one 20mg  tablet once a day with food. 09/05/18   Renne Crigler, PA-C  rosuvastatin (CRESTOR) 40 MG tablet Take 40 mg by mouth daily.    [provider]  tiZANidine (ZANAFLEX) 4 MG capsule Take 4 mg by mouth as needed. For 30 days 06/25/18   [provider]    Family History Family History  Problem Relation Age of Onset  . Diabetes type II Mother   . Depression Father   . Suicidality Father   . Asthma Brother     Social History Social History   Tobacco Use  . Smoking status: Current Every Day Smoker    Packs/day: 0.50    Years: 40.00    Pack years: 20.00    Types: Cigarettes  . Smokeless tobacco: Never Used  . Tobacco comment: 05/12/2013 'eased off smoking since last month"  Substance Use Topics  . Alcohol use: Yes    Comment: 4 40 oz beers per day  . Drug use: No    Types: "Crack" cocaine     Allergies   Nsaids and Toradol [ketorolac tromethamine]   Review of Systems Review of Systems  Constitutional: Negative for chills, diaphoresis, fatigue, fever and malaise/fatigue.  HENT: Negative for congestion and rhinorrhea.   Eyes: Negative for visual disturbance.  Respiratory: Positive for hemoptysis, chest tightness and shortness of breath. Negative for cough, sputum production, wheezing and  stridor.   Cardiovascular: Positive for chest pain. Negative for palpitations, leg swelling, syncope and near-syncope.  Gastrointestinal: Negative for abdominal pain, constipation, diarrhea, nausea and vomiting.  Genitourinary: Negative for dysuria and frequency.  Musculoskeletal: Negative for back pain, neck pain and neck stiffness.  Skin: Negative for rash and wound.  Neurological: Negative for weakness, light-headedness, numbness and headaches.  Psychiatric/Behavioral: Negative for agitation and confusion.  All other systems reviewed and are negative.    Physical Exam Updated Vital Signs BP (!) 172/130   Pulse 64   Temp 97.7 F (36.5 C) (Oral)   Resp 16   Ht 5\' 5"  (1.651 m)   Wt 87 kg   SpO2 96%   BMI 31.92 kg/m   Physical Exam  Constitutional: He appears well-developed and well-nourished.  Non-toxic appearance. He does not appear ill. No distress.  HENT:  Head: Normocephalic and atraumatic.  Nose: Nose normal.  Mouth/Throat: Oropharynx is clear and moist. No oropharyngeal exudate.  Eyes: Pupils are equal, round, and reactive to light. Conjunctivae and EOM are normal. No scleral icterus.  Neck: Neck supple.  Cardiovascular: Normal rate, regular rhythm, normal heart sounds and intact distal pulses.  No murmur heard. Pulmonary/Chest: Effort normal. Tachypnea noted. No respiratory distress. He has no wheezes. He has rhonchi. He has no rales. He exhibits no tenderness.  Abdominal: Soft. He exhibits no distension. There is no tenderness.  Musculoskeletal: Normal range of motion. He exhibits tenderness. He exhibits no edema or deformity.  Neurological: He is alert. No sensory deficit. He exhibits normal muscle tone.  Skin: Skin is warm and dry. Capillary refill takes less than 2 seconds. He is not diaphoretic. No erythema. No pallor.  Psychiatric: He has a normal mood and affect.  Nursing note and vitals reviewed.    ED Treatments / Results  Labs (all labs ordered are  listed, but only abnormal results are displayed) Labs Reviewed  COMPREHENSIVE METABOLIC PANEL - Abnormal; Notable for the following components:      Result Value   Glucose, Bld 140 (*)    All other components within normal limits  CBC WITH DIFFERENTIAL/PLATELET - Abnormal; Notable for the following components:   RDW 16.9 (*)    Platelets 478 (*)    All other components within normal limits  URINALYSIS, ROUTINE W REFLEX MICROSCOPIC - Abnormal; Notable for the following components:   APPearance HAZY (*)    All other components within normal limits  URINE CULTURE  RAPID URINE DRUG SCREEN, HOSP PERFORMED  I-STAT TROPONIN, ED  I-STAT TROPONIN, ED    EKG EKG Interpretation  Date/Time:  Wednesday October 22 2018 08:32:38 EST Ventricular Rate:  72 PR Interval:    QRS Duration: 84 QT Interval:  384 QTC Calculation: 418 R Axis:   52 Text Interpretation:  Sinus rhythm When compared to prior, more artifact.  No STEMI Confirmed by Theda Belfastegeler, Chris ([id number]) on 10/22/2018 8:52:43 AM   Radiology Ct Angio Chest Pe W And/or Wo Contrast  Result Date: 10/22/2018 CLINICAL DATA:  57 year old male with acute chest pain and shortness of breath. EXAM: CT ANGIOGRAPHY CHEST WITH CONTRAST TECHNIQUE: Multidetector CT imaging of the chest was performed using the standard protocol during bolus administration of intravenous contrast. Multiplanar CT image reconstructions and MIPs were obtained to evaluate the vascular anatomy. CONTRAST:  70 cc intravenous Isovue 370 COMPARISON:  09/05/2018 CT and prior studies FINDINGS: Cardiovascular: This is a technically satisfactory study for evaluation of pulmonary emboli. No pulmonary emboli are identified. Cardiomegaly, and coronary artery and aortic atherosclerotic calcifications again noted. There is no evidence of thoracic aortic aneurysm or pericardial effusion. Mediastinum/Nodes: Mildly prominent bilateral axillary lymph nodes are unchanged from 2010. No new or  enlarging lymph nodes or mediastinal mass identified. The visualized thyroid gland and esophagus are unremarkable. Lungs/Pleura: No airspace disease, consolidation, mass, suspicious nodule, pleural effusion or pneumothorax. A few tiny bilateral pulmonary nodules are unchanged from 2010. Upper Abdomen: No acute abnormality Musculoskeletal: No acute or suspicious abnormality. Review of the MIP images confirms the above findings. IMPRESSION: 1. No evidence of acute abnormality. No evidence of pulmonary emboli. 2. Cardiomegaly and coronary artery disease 3.  Aortic Atherosclerosis (ICD10-I70.0). Electronically Signed   By: Harmon PierJeffrey  Hu M.D.   On: 10/22/2018 11:24    Procedures Procedures (including critical care time)  Medications Ordered in ED Medications  iopamidol (ISOVUE-370) 76 % injection (has  no administration in time range)  iopamidol (ISOVUE-370) 76 % injection 100 mL (70 mLs Intravenous Contrast Given 10/22/18 1103)     Initial Impression / Assessment and Plan / ED Course  I have reviewed the triage vital signs and the nursing notes.  Pertinent labs & imaging results that were available during my care of the patient were reviewed by me and considered in my medical decision making (see chart for details).     Darryl Hurley is a 57 y.o. male with a past medical history significant for hypertension, asthma, COPD, gout, diabetes, seizures, polysubstance abuse, and recent diagnosis of DVT on Xarelto who presents with 2 days of chest pain, shortness of breath, and hemoptysis.  Patient reports that last night he had onset of chest discomfort.  He described it as a sharp pain across his chest and primarily on the right side.  He reports is extremely pleuritic and exertional.  He reports it gets up to a 10 out of 10 in severity but is currently at a 5 out of 10.  He reports minimal cough.  He does report this morning he woke up with blood in his mouth and he was concerned it was hemoptysis.  He is  never had this before.  He reports subjective fevers and chills.  He denies any nausea, vomiting, constipation, diarrhea, or dysuria.  He reports that last month when he was diagnosed with DVT he was having right leg pain and that has resolved but now he is having left leg pain that is new for the last few days.  He reports he has been taking his blood thinner as directed and has not done any drugs over the last few weeks.  He had hypertension in route but otherwise was not tachycardic or hypoxic.  On exam, lungs have extremely coarse breath sounds in all lung fields.  Chest is nontender.  No murmur.  Abdomen nontender.  Tenderness present in the left knee area.  Normal sensation and strength in the legs.  Normal pulses in upper extremities.  EKG showed no STEMI.  Given patient's recent diagnosis of DVT, and his new chest pain, shortness of breath, and hemoptysis I am concerned patient may developed a pulmonary embolism.  He will have a CT PE scan to further evaluate.  Given the coarse breath sounds and the reported chills, we will also look for pneumonia on the CT scan.  Patient will have troponin and other labs to further evaluate.  He does not feel he had a seizure and he did not bite his tongue.  Doubt this is the cause of the blood in his mouth this morning.  Anticipate reassessment after work-up.  Patient's diagnostic work-up returned reassuring.  DVT study was negative.  CT scan showed no evidence of acute pulmonary embolism or other intra-thoracic abnormality aside from coronary artery disease.  Patient has been chest pain-free while in the emergency department.  Patient's troponin was negative x2.  Other labs reassuring.   Patient feels comfortable going home and following up with PCP and a cardiologist.  Patient understands return precautions for any new or worsened symptoms.  Patient and other worsens or concerns and was discharged in good condition.   Final Clinical Impressions(s) / ED  Diagnoses   Final diagnoses:  Precordial pain    ED Discharge Orders    None      Clinical Impression: 1. Precordial pain     Disposition: Discharge  Condition: Good  I have discussed the  results, Dx and Tx plan with the pt(& family if present). He/she/they expressed understanding and agree(s) with the plan. Discharge instructions discussed at great length. Strict return precautions discussed and pt &/or family have verbalized understanding of the instructions. No further questions at time of discharge.    New Prescriptions   No medications on file    Follow Up: Augustine Radar, MD [address] [place] [address] [phone]     [facility] EMERGENCY DEPARTMENT [address] [id number] mc [place] [address] [phone]       , Canary Brim, MD 10/22/18 1251

## 2018-10-22 NOTE — ED Notes (Signed)
Patient returned from CT

## 2018-10-22 NOTE — ED Notes (Signed)
Pt taken to CT.

## 2018-10-22 NOTE — ED Triage Notes (Signed)
Pt from home called EMS because he woke up with bllod on mouth. Pt has a hx of seizures and a hx of DVT . Pt currently on Blood thinners. Pt also has ABD / lower CP. Pain 5/10

## 2018-10-22 NOTE — ED Notes (Signed)
Pt aware of need for urine specimen. 

## 2018-10-22 NOTE — Discharge Instructions (Signed)
Your work-up today showed no evidence of new blood clot and no pulmonary embolism.  Your cardiac enzymes were -2 times and your imaging was reassuring.  Given your improvement in symptoms and your well appearance, we feel you are safe for discharge home with outpatient follow-up.  If any symptoms change or worsen, please return to the nearest emergency department.

## 2018-10-22 NOTE — Progress Notes (Addendum)
*  Preliminary Results* Left lower extremity venous duplex completed. Left lower extremity is negative for deep vein thrombosis. There is no evidence of left Baker's cyst.   Gave results to Dr. Rush Landmarkegeler.  10/22/2018 10:42 AM  Aundra MilletMegan Clare Gandyiddle

## 2018-10-23 LAB — URINE CULTURE: Culture: NO GROWTH

## 2018-11-24 ENCOUNTER — Other Ambulatory Visit: Payer: Self-pay

## 2018-11-24 ENCOUNTER — Emergency Department (HOSPITAL_COMMUNITY)
Admission: EM | Admit: 2018-11-24 | Discharge: 2018-11-24 | Disposition: A | Discharge status: Home / Self Care | Payer: Medicaid Other | Attending: Emergency Medicine | Admitting: Emergency Medicine

## 2018-11-24 ENCOUNTER — Emergency Department (HOSPITAL_COMMUNITY): Payer: Medicaid Other

## 2018-11-24 ENCOUNTER — Encounter (HOSPITAL_COMMUNITY): Payer: Self-pay | Admitting: Student

## 2018-11-24 DIAGNOSIS — J441 Chronic obstructive pulmonary disease with (acute) exacerbation: Secondary | ICD-10-CM

## 2018-11-24 DIAGNOSIS — M25562 Pain in left knee: Secondary | ICD-10-CM | POA: Insufficient documentation

## 2018-11-24 DIAGNOSIS — Z79899 Other long term (current) drug therapy: Secondary | ICD-10-CM | POA: Insufficient documentation

## 2018-11-24 DIAGNOSIS — F17210 Nicotine dependence, cigarettes, uncomplicated: Secondary | ICD-10-CM | POA: Diagnosis not present

## 2018-11-24 DIAGNOSIS — R0602 Shortness of breath: Secondary | ICD-10-CM | POA: Diagnosis present

## 2018-11-24 DIAGNOSIS — I10 Essential (primary) hypertension: Secondary | ICD-10-CM | POA: Diagnosis not present

## 2018-11-24 DIAGNOSIS — E119 Type 2 diabetes mellitus without complications: Secondary | ICD-10-CM | POA: Insufficient documentation

## 2018-11-24 DIAGNOSIS — J45909 Unspecified asthma, uncomplicated: Secondary | ICD-10-CM | POA: Diagnosis not present

## 2018-11-24 DIAGNOSIS — Z7984 Long term (current) use of oral hypoglycemic drugs: Secondary | ICD-10-CM | POA: Diagnosis not present

## 2018-11-24 LAB — CBC
HCT: 44.8 % (ref 39.0–52.0)
HEMOGLOBIN: 14.2 g/dL (ref 13.0–17.0)
MCH: 29 pg (ref 26.0–34.0)
MCHC: 31.7 g/dL (ref 30.0–36.0)
MCV: 91.6 fL (ref 80.0–100.0)
NRBC: 0 % (ref 0.0–0.2)
Platelets: 429 10*3/uL — ABNORMAL HIGH (ref 150–400)
RBC: 4.89 MIL/uL (ref 4.22–5.81)
RDW: 16.7 % — ABNORMAL HIGH (ref 11.5–15.5)
WBC: 8.4 10*3/uL (ref 4.0–10.5)

## 2018-11-24 LAB — BASIC METABOLIC PANEL
ANION GAP: 12 (ref 5–15)
BUN: 20 mg/dL (ref 6–20)
CO2: 22 mmol/L (ref 22–32)
Calcium: 9 mg/dL (ref 8.9–10.3)
Chloride: 106 mmol/L (ref 98–111)
Creatinine, Ser: 1.16 mg/dL (ref 0.61–1.24)
Glucose, Bld: 125 mg/dL — ABNORMAL HIGH (ref 70–99)
POTASSIUM: 3.7 mmol/L (ref 3.5–5.1)
SODIUM: 140 mmol/L (ref 135–145)

## 2018-11-24 LAB — TROPONIN I
Troponin I: <0.03 ng/mL (ref <0.03)
Troponin I: <0.03 ng/mL (ref <0.03)

## 2018-11-24 LAB — BRAIN NATRIURETIC PEPTIDE: B Natriuretic Peptide: 29.6 pg/mL (ref 0.0–100.0)

## 2018-11-24 IMAGING — CR DG KNEE COMPLETE 4+V*L*
4 series · 4 of 4 positions shown · non-contrast
Comparison: [DATE]

CLINICAL DATA: Pain and swelling LEFT knee, denies injury, history
of gout, COPD, hypertension, asthma, alcohol abuse, smoker

EXAM:
LEFT KNEE - COMPLETE 4+ VIEW

[x knee ap left (1 of 4)]
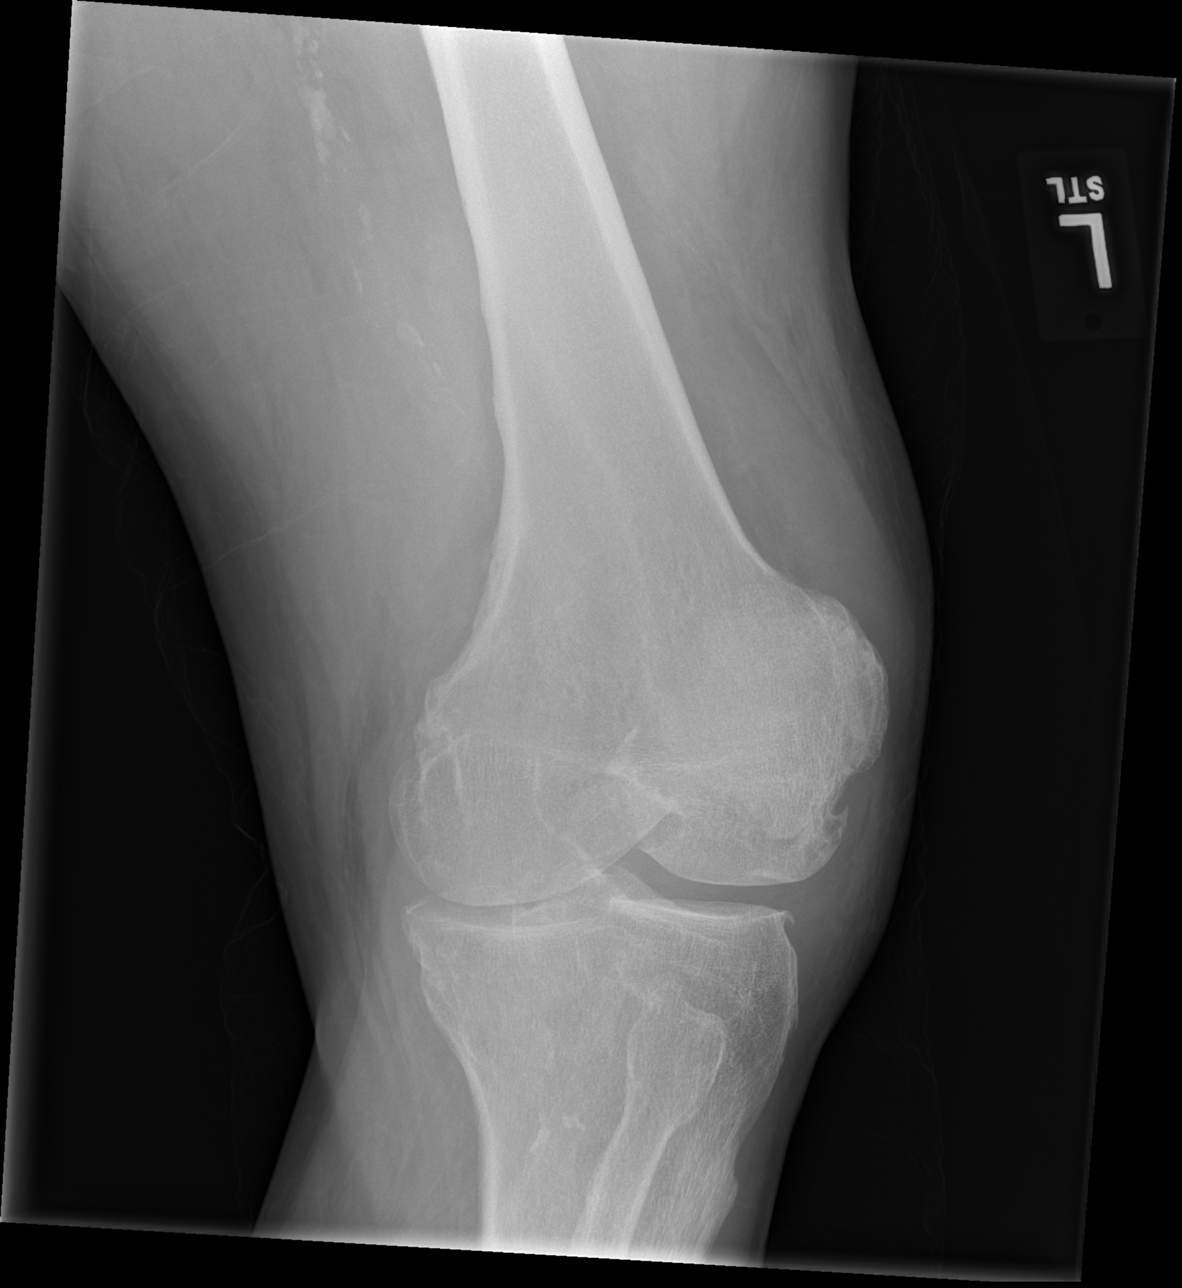

[x knee ap left (2 of 4)]
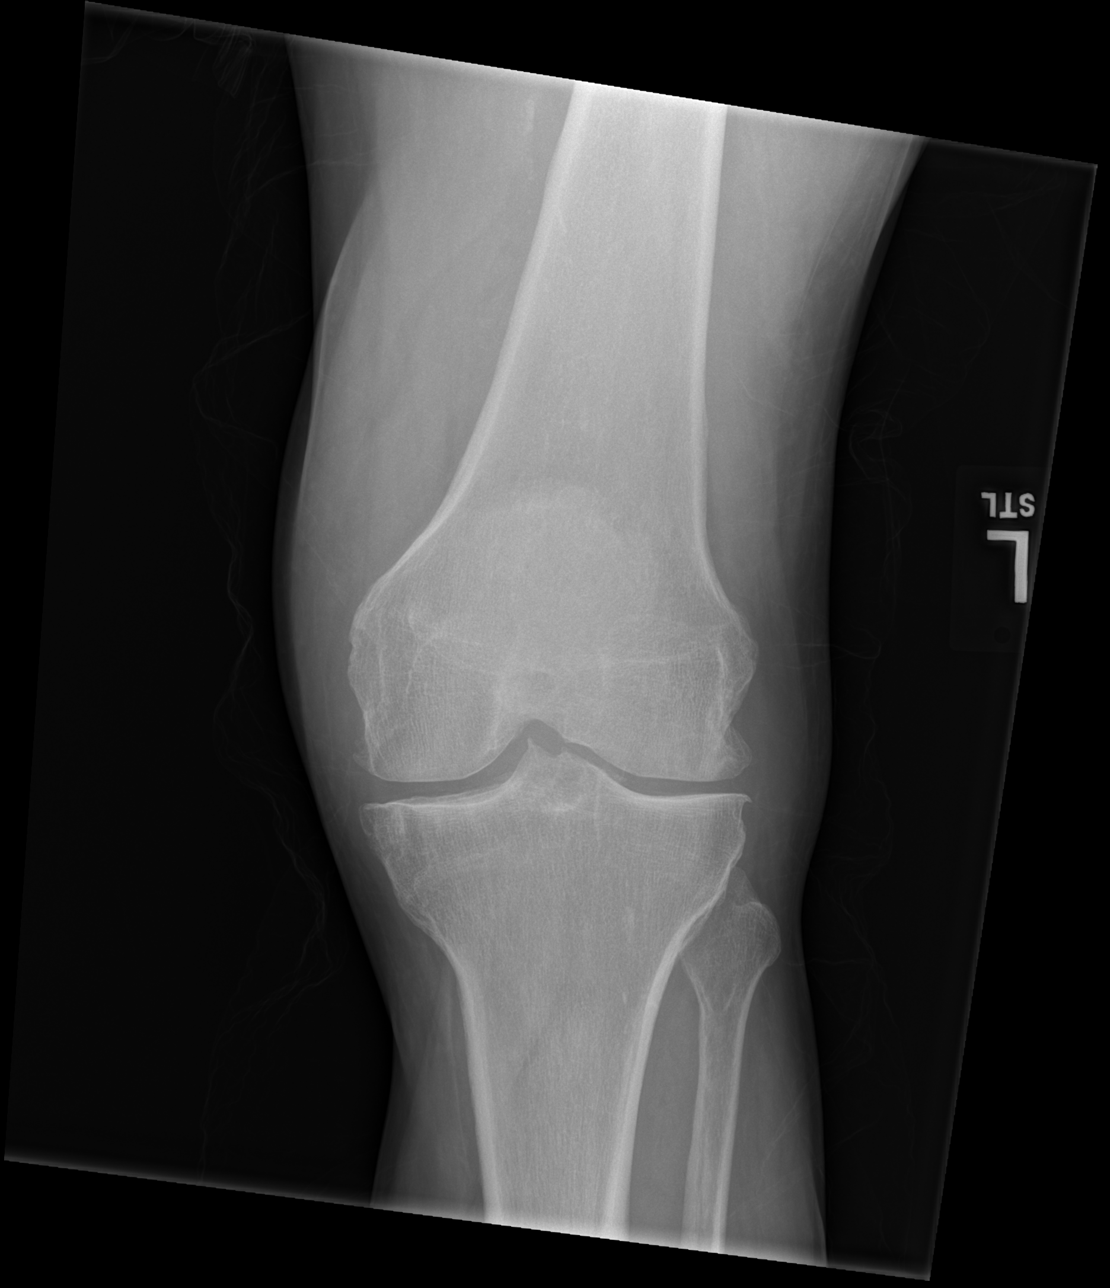

[x knee ap left (3 of 4)]
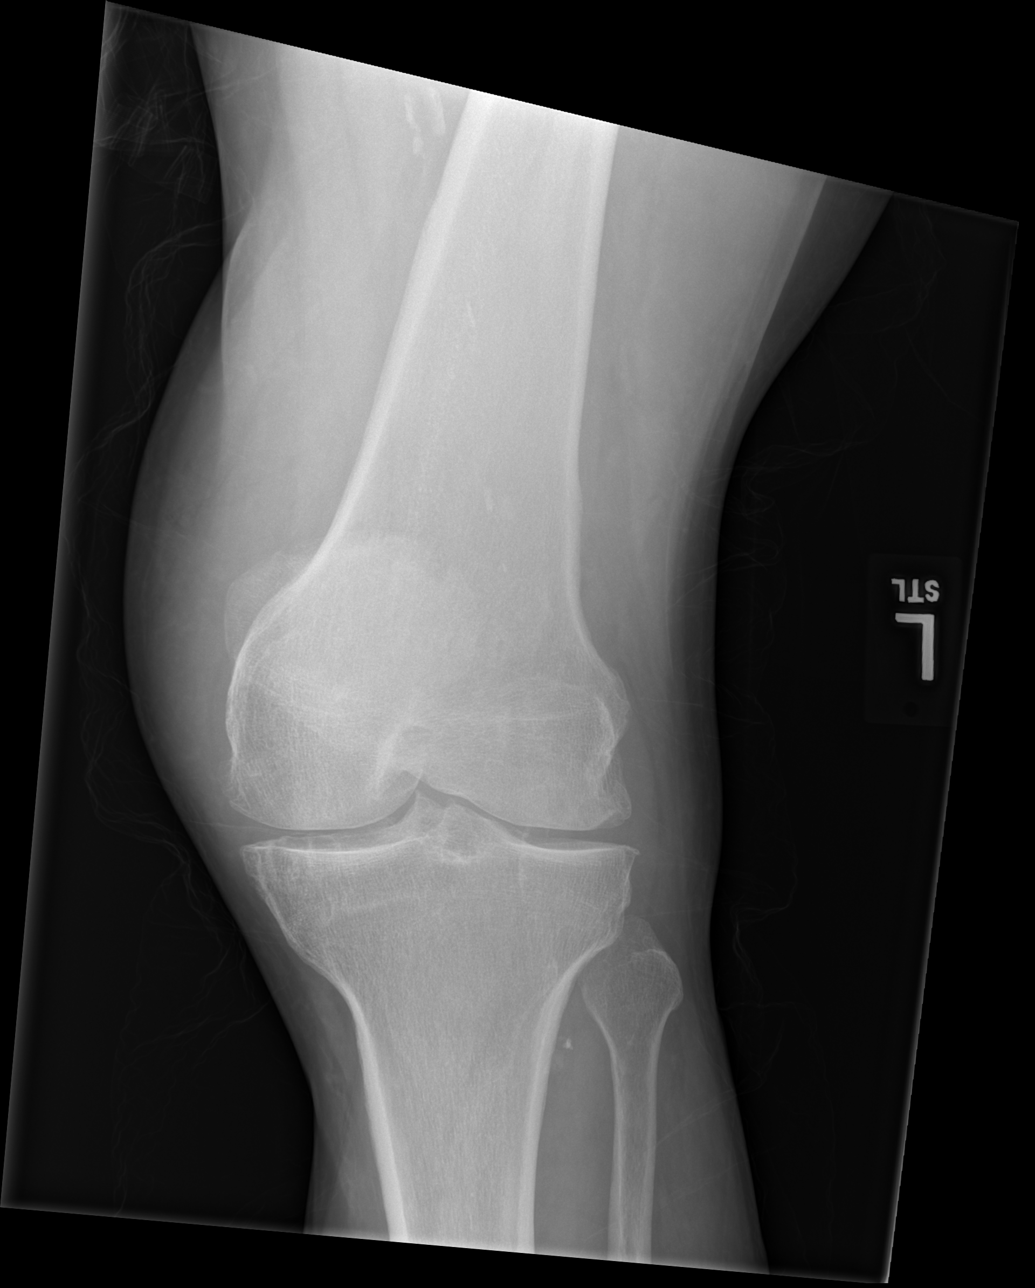

[x knee ap left (4 of 4)]
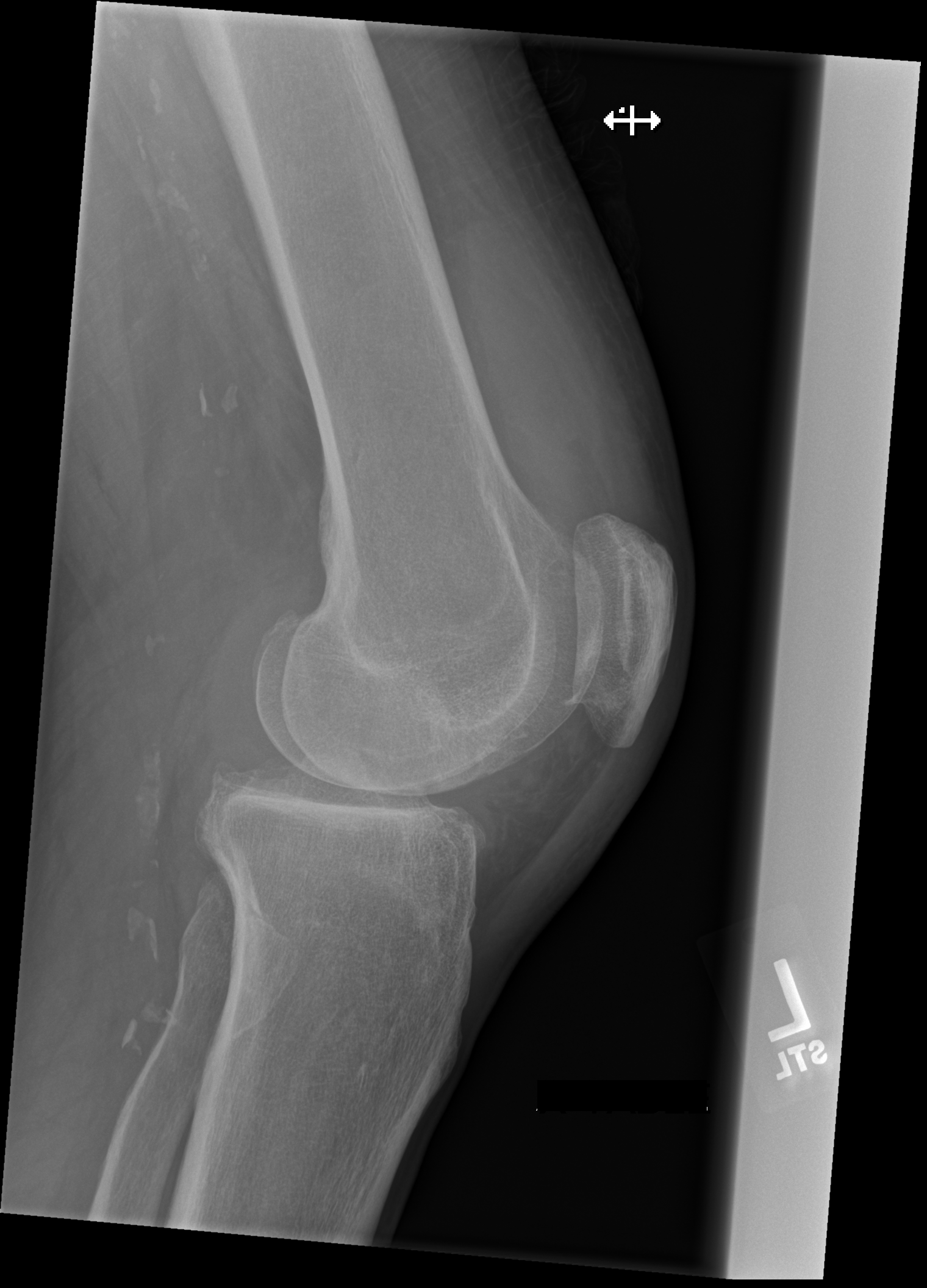

[4 of 4 positions shown; findings below may reference images not displayed]

FINDINGS: Osseous demineralization.

Diffuse joint space narrowing and marginal spur formation.

No acute fracture, dislocation, or bone destruction.

Large knee joint effusion.

No erosive changes.

Scattered atherosclerotic calcifications.
IMPRESSION: Degenerative changes and osseous demineralization.

No acute abnormalities.

## 2018-11-24 IMAGING — CR DG CHEST 2V
2 series · 2 of 2 positions shown · non-contrast
Comparison: CTA chest [DATE]

CLINICAL DATA: Shortness of breath.

EXAM:
CHEST - 2 VIEW

[w chest lat]
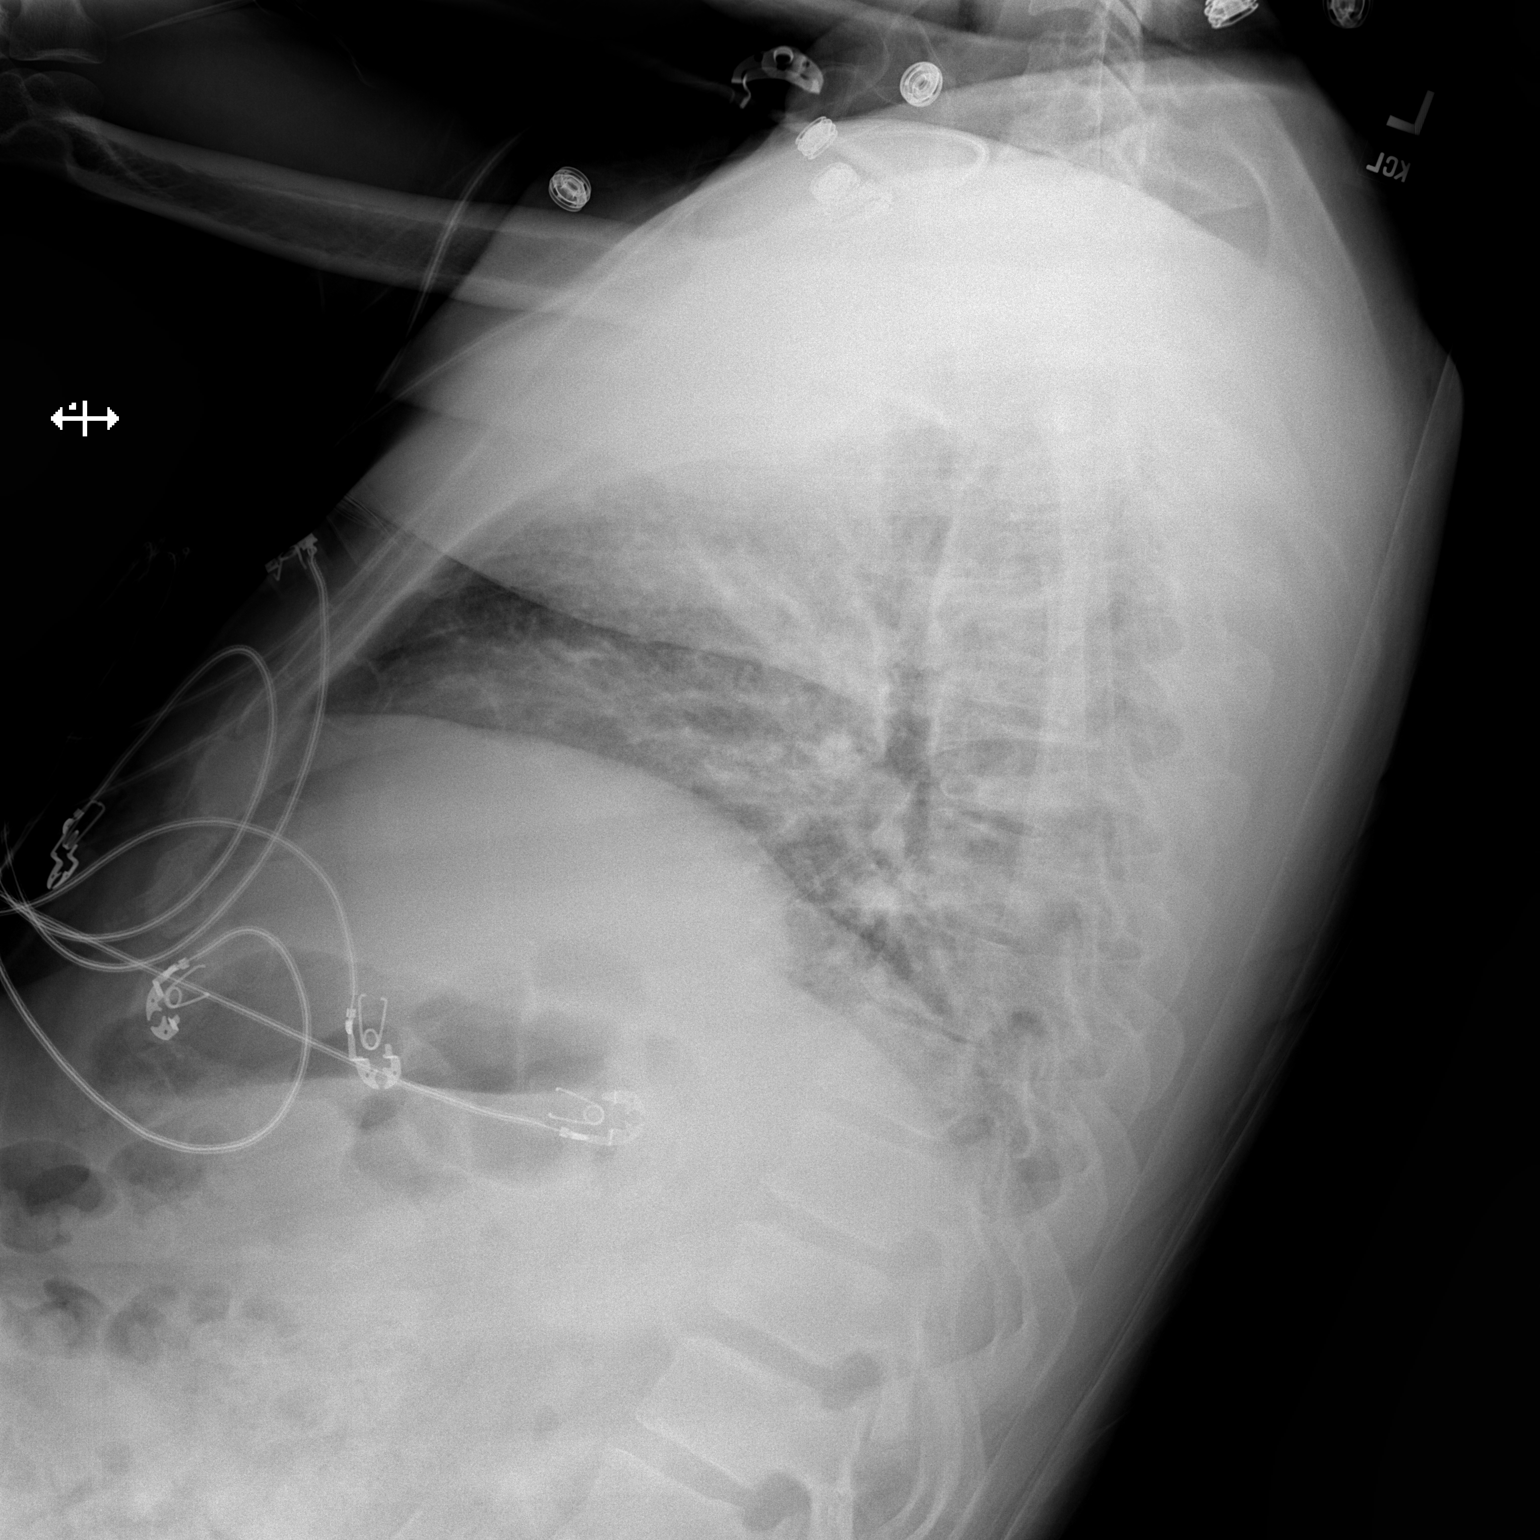

[x chest ap]
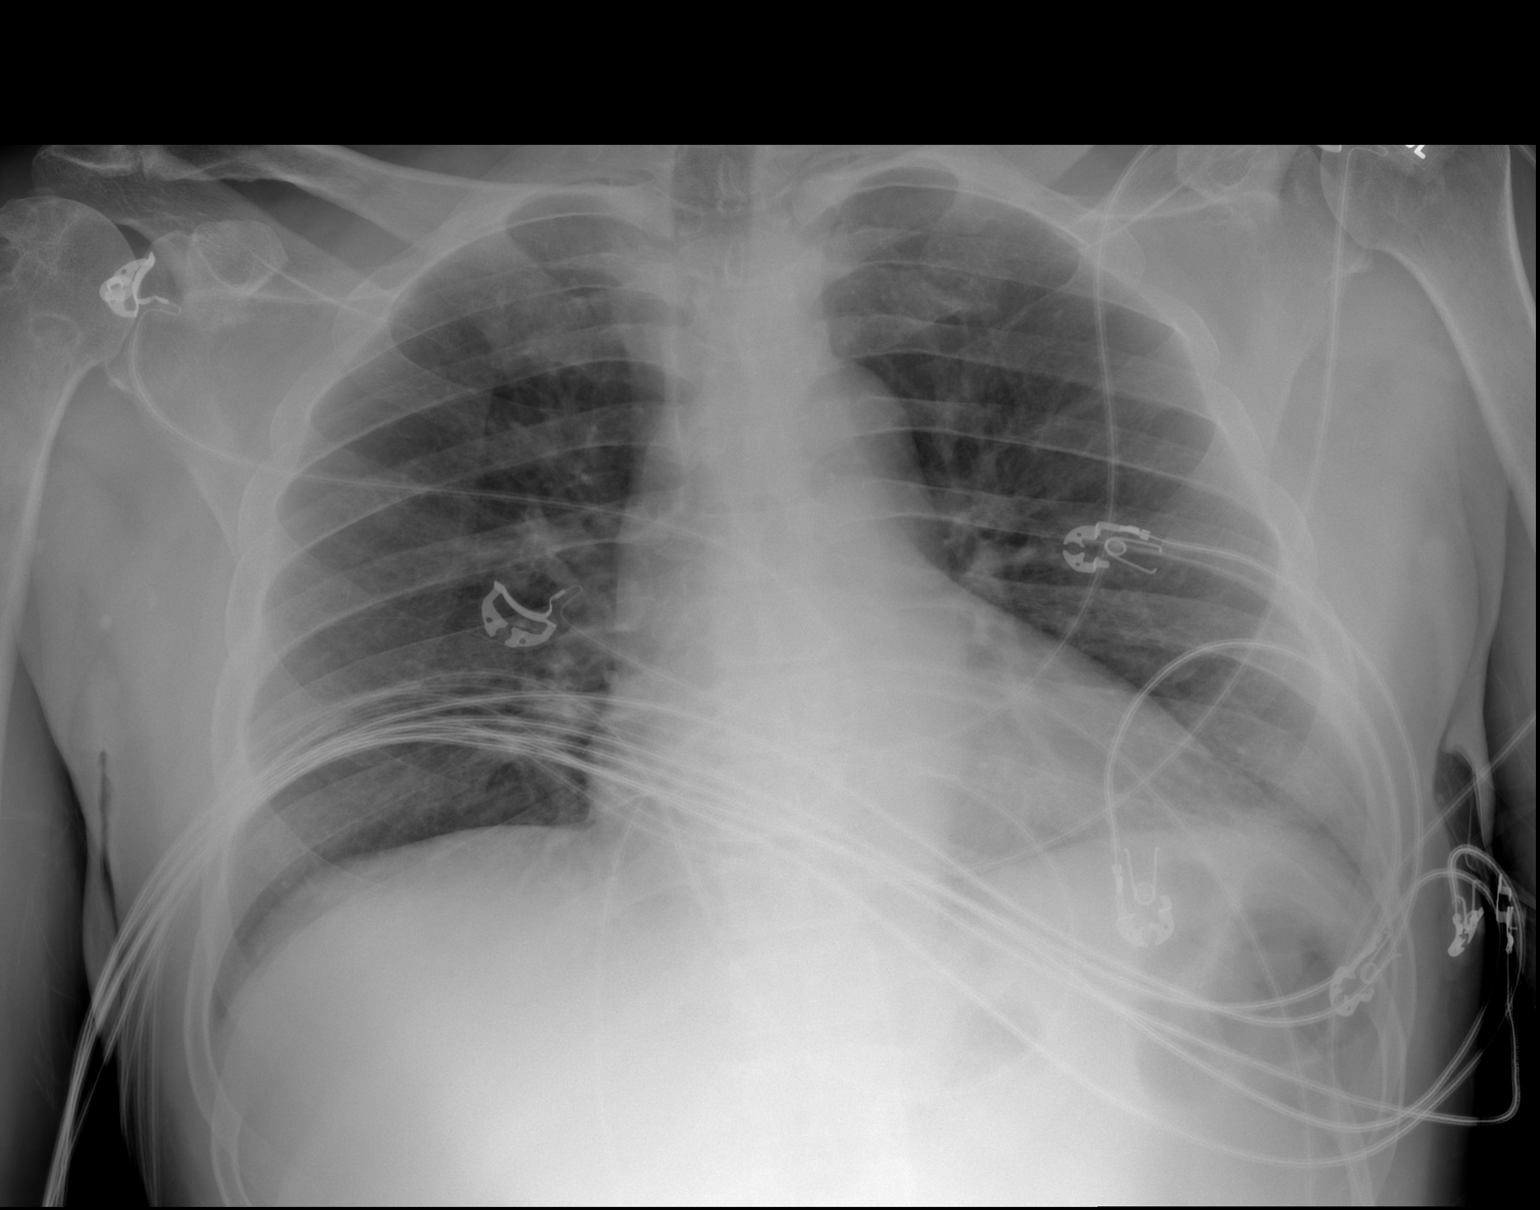

[2 of 2 positions shown; findings below may reference images not displayed]

FINDINGS: Heart size is normal. Lung volumes are low. Mild pulmonary vascular
congestion is present. Mild atelectasis is worse on the left. No
significant airspace consolidation is present. The visualized soft
tissues and bony thorax are unremarkable.
IMPRESSION: 1. Low lung volumes with mild bibasilar airspace disease, left
greater than right. This likely reflects atelectasis.
2. Mild pulmonary vascular congestion.

## 2018-11-24 MED ORDER — ONDANSETRON HCL 4 MG/2ML IJ SOLN
4.0000 mg | Freq: Once | INTRAMUSCULAR | Status: AC
  Administered 2018-11-24: 4 mg via INTRAVENOUS
  Filled 2018-11-24: qty 2

## 2018-11-24 MED ORDER — AZITHROMYCIN 250 MG PO TABS: 250.0000 mg | ORAL_TABLET | Freq: Every day | ORAL | 0 refills | Status: DC

## 2018-11-24 MED ORDER — MORPHINE SULFATE (PF) 4 MG/ML IV SOLN
4.0000 mg | Freq: Once | INTRAVENOUS | Status: AC
  Administered 2018-11-24: 4 mg via INTRAVENOUS
  Filled 2018-11-24: qty 1

## 2018-11-24 MED ORDER — HYDROMORPHONE HCL 1 MG/ML IJ SOLN
0.5000 mg | Freq: Once | INTRAMUSCULAR | Status: AC
  Administered 2018-11-24: 0.5 mg via INTRAVENOUS
  Filled 2018-11-24: qty 1

## 2018-11-24 MED ORDER — OXYCODONE-ACETAMINOPHEN 5-325 MG PO TABS: 1.0000 | ORAL_TABLET | Freq: Four times a day (QID) | ORAL | 0 refills | Status: DC | PRN

## 2018-11-24 MED ORDER — PREDNISONE 50 MG PO TABS: 50.0000 mg | ORAL_TABLET | Freq: Every day | ORAL | 0 refills | Status: AC

## 2018-11-24 MED ORDER — METHYLPREDNISOLONE SODIUM SUCC 125 MG IJ SOLR
125.0000 mg | Freq: Once | INTRAMUSCULAR | Status: AC
  Administered 2018-11-24: 125 mg via INTRAVENOUS
  Filled 2018-11-24: qty 2

## 2018-11-24 NOTE — ED Notes (Signed)
Patient concerned about trying to get home. RN informed patient that since he can walk, they will not normally transport back by EMS. RN made PA aware of patient's concern

## 2018-11-24 NOTE — ED Notes (Signed)
Patient transported to X-ray 

## 2018-11-24 NOTE — ED Notes (Signed)
Informed ortho tech about patients order.

## 2018-11-24 NOTE — Discharge Instructions (Addendum)
You are seen in the ER today for trouble breathing and knee pain.  Your work-up in the emergency department was fairly reassuring.  Your x-ray showed some degenerative changes and some fluid around the left knee, is likely that you have gout in this knee, we would like you to follow-up closely with an orthopedic doctor for recheck of this area within the next 3 to 4 days.  Your chest x-ray showed some congestion in the lungs, we are treating you for a COPD exacerbation with prednisone, the prednisone will also treat the gout.  We are also placing you on azithromycin, an antibiotic, to help with this as well.  Please take all these medications as prescribed. Also sending home with a short course of Percocet to help with severe pain. -Percocet-this is a narcotic/controlled substance medication that has potential addicting qualities.  We recommend that you take 1-2 tablets every 6 hours as needed for severe pain.  Do not drive or operate heavy machinery when taking this medicine as it can be sedating. Do not drink alcohol or take other sedating medications when taking this medicine for safety reasons.  Keep this out of reach of small children.  Please be aware this medicine has Tylenol in it (325 mg/tab) do not exceed the maximum dose of Tylenol in a day per over the counter recommendations should you decide to supplement with Tylenol over the counter.   We have prescribed you new medication(s) today. Discuss the medications prescribed today with your pharmacist as they can have adverse effects and interactions with your other medicines including over the counter and prescribed medications. Seek medical evaluation if you start to experience new or abnormal symptoms after taking one of these medicines, seek care immediately if you start to experience difficulty breathing, feeling of your throat closing, facial swelling, or rash as these could be indications of a more serious allergic reaction  Please follow-up  with your primary care doctor as well as orthopedics in the next 3 to 4 days.  Return to the ER for new or worsening symptoms or any other concerns.

## 2018-11-24 NOTE — ED Provider Notes (Signed)
[facility]-EMERGENCY DEPT Provider Note   CSN: [id number] Arrival date & time: 11/24/18  1425     History   Chief Complaint Chief Complaint  Patient presents with  . Shortness of Breath    HPI Darryl Hurley is a 57 y.o. male with a hx of tobacco abuse, EtOH abuse, cocaine abuse, asthma, COPD, T2DM, HTN, and seizures who presents to the ED via EMS with complaints of  L knee pain x 2 weeks & dyspnea x 2 days.   Patient reports L knee pain/swelling x 2 weeks. No known injury or change in activity. Pain is severe, worse with movement/ambulationg, no alleviating factors, no meds PTA. States feels similar to prior gout- has had it in the R knee previously confirmed on synovial fluid analysis. No pain/swelling to distal or proximal leg. Denies fever, chills, erythema, or warmth.   Patient reports dyspnea x 2 days. States he feels short of breath with bilateral chest tightness & wheezing. Sxs constant, worse with activity, no alleviating factors. Using inhaler at home without much change. Has had congestion w/ some cough, no increased sputum production, also w/ congestion/rhinorrhea. Has had some bilateral lower rib pain. Denies fever, nausea, vomiting, abdominal pain, sore throat, or ear pain. Feels similar to prior asthma/COPD issues. Some improvement w/ 10 mg of albuterol & 0.5 mg of Atrovent en route. He does endorse some shortness of breath at night, sleeping on 2-3 pillows as opposed to 1, he states this is more due to knee pain- somewhat of a poor historian.   HPI  Past Medical History:  Diagnosis Date  . Alcohol abuse   . Angina pectoris   . Arthritis   . Asthma   . Chronic lower back pain   . COPD (chronic obstructive pulmonary disease) (HCC)    on home O2 2L  . Diabetes mellitus without complication (HCC)   . Exertional shortness of breath   . Gout   . Hypertension   . Seizures [facility])     Patient Active Problem List   Diagnosis Date Noted  . Lactic  acidosis 01/06/2016  . Lactic acid acidosis 01/06/2016  . Alcohol abuse with intoxication (HCC) 01/06/2016  . COPD with acute exacerbation (HCC) 02/02/2014  . HCAP (healthcare-associated pneumonia) 02/02/2014  . Increased anion gap metabolic acidosis 02/02/2014  . Cocaine abuse (HCC) 11/18/2013  . Fever 11/17/2013  . Anemia 11/17/2013  . Hypokalemia 11/17/2013  . Polyarthritis 11/17/2013  . Dehydration 11/17/2013  . Type 2 diabetes mellitus (HCC) 05/14/2013  . Chronic obstructive pulmonary disease (COPD) (HCC) 05/12/2013  . Gout 05/12/2013  . Headache 05/12/2013  . Asthma 05/04/2013  . TOBACCO ABUSE 04/27/2009  . Uncontrolled hypertension 04/27/2009    Past Surgical History:  Procedure Laterality Date  . NO PAST SURGERIES          Home Medications    Prior to Admission medications   Medication Sig Start Date End Date Taking? Authorizing Provider  albuterol (PROVENTIL HFA;VENTOLIN HFA) 108 (90 BASE) MCG/ACT inhaler Inhale 1 puff into the lungs every 6 (six) hours as needed for wheezing or shortness of breath.    [provider]  allopurinol (ZYLOPRIM) 100 MG tablet Take 100 mg by mouth daily.    [provider]  amLODipine (NORVASC) 10 MG tablet Take 10 mg by mouth daily. 08/06/18   [provider]  cloNIDine (CATAPRES) 0.1 MG tablet Take 0.1 mg by mouth 2 (two) times daily. 08/15/18   [provider]  colchicine  0.6 MG tablet Take one tablet by mouth every morning for 5 days beginning 12/25/17 Patient not taking: Reported on 10/22/2018 12/24/17   Street, [place], PA-C  cyclobenzaprine (FLEXERIL) 10 MG tablet Take 1 tablet (10 mg total) by mouth 2 (two) times daily as needed for muscle spasms. Patient not taking: Reported on 10/22/2018 04/26/18   McDonald, Pedro Earls A, PA-C  FLOVENT HFA 110 MCG/ACT inhaler Inhale 1 puff into the lungs 2 (two) times daily as needed (shortness of breath).  08/06/18   [provider]  fluticasone (FLONASE) 50  MCG/ACT nasal spray Place 2 sprays into both nostrils daily.  08/06/18   [provider]  gabapentin (NEURONTIN) 300 MG capsule Take 300 mg by mouth 2 (two) times daily. 10/20/18   [provider]  hydrochlorothiazide (HYDRODIURIL) 25 MG tablet Take 25 mg by mouth daily.  07/14/18   [provider]  HYDROcodone-acetaminophen (NORCO) 5-325 MG tablet Take 1 tablet by mouth every 6 (six) hours as needed for severe pain. Patient not taking: Reported on 09/05/2018 12/24/17   Street, [place], PA-C  losartan (COZAAR) 100 MG tablet Take 1 tablet (100 mg total) by mouth daily. 12/24/17   Street, [place], PA-C  metFORMIN (GLUCOPHAGE) 1000 MG tablet Take 1 tablet (1,000 mg total) by mouth 2 (two) times daily with a meal. 01/08/16   Alison Murray, MD  methocarbamol (ROBAXIN) 500 MG tablet Take 1 tablet (500 mg total) by mouth every 8 (eight) hours as needed for muscle spasms. 08/23/17   Ward, Layla Maw, DO  Rivaroxaban 15 & 20 MG TBPK Take as directed on package: Start with one 15mg  tablet by mouth twice a day with food. On Day 22, switch to one 20mg  tablet once a day with food. 09/05/18   Renne Crigler, PA-C  rosuvastatin (CRESTOR) 40 MG tablet Take 40 mg by mouth daily.    [provider]  tiZANidine (ZANAFLEX) 4 MG capsule Take 4 mg by mouth as needed for muscle spasms.  06/25/18   [provider]    Family History Family History  Problem Relation Age of Onset  . Diabetes type II Mother   . Depression Father   . Suicidality Father   . Asthma Brother     Social History Social History   Tobacco Use  . Smoking status: Current Every Day Smoker    Packs/day: 0.50    Years: 40.00    Pack years: 20.00    Types: Cigarettes  . Smokeless tobacco: Never Used  . Tobacco comment: 05/12/2013 'eased off smoking since last month"  Substance Use Topics  . Alcohol use: Yes    Comment: 4 40 oz beers per day  . Drug use: No    Types: "Crack" cocaine     Allergies     Nsaids and Toradol [ketorolac tromethamine]   Review of Systems Review of Systems  Constitutional: Negative for chills and fever.  HENT: Positive for congestion and rhinorrhea. Negative for sore throat.   Respiratory: Positive for cough, chest tightness, shortness of breath and wheezing.   Cardiovascular: Negative for palpitations and leg swelling.  Gastrointestinal: Negative for abdominal pain, constipation, nausea and vomiting.  Musculoskeletal: Positive for arthralgias and joint swelling.  Neurological: Negative for syncope, weakness and numbness.  All other systems reviewed and are negative.    Physical Exam Updated Vital Signs BP (!) 146/118 (BP Location: Left Arm) Comment: Patient moving  Pulse 86   Temp 97.8 F (36.6 C) (Oral)   Resp (!) 21  SpO2 98%   Physical Exam Vitals signs and nursing note reviewed.  Constitutional:      General: He is not in acute distress.    Appearance: He is well-developed. He is not toxic-appearing.  HENT:     Head: Normocephalic and atraumatic.     Right Ear: Tympanic membrane and external ear normal.     Left Ear: Tympanic membrane and external ear normal.     Nose: Mucosal edema present.     Right Sinus: No maxillary sinus tenderness or frontal sinus tenderness.     Left Sinus: No maxillary sinus tenderness or frontal sinus tenderness.     Mouth/Throat:     Mouth: Mucous membranes are moist.     Pharynx: Oropharynx is clear. Uvula midline. No oropharyngeal exudate or posterior oropharyngeal erythema.  Eyes:     General:        Right eye: No discharge.        Left eye: No discharge.     Conjunctiva/sclera: Conjunctivae normal.  Neck:     Musculoskeletal: Neck supple. No edema, erythema, neck rigidity or crepitus.  Cardiovascular:     Rate and Rhythm: Normal rate and regular rhythm.     Pulses:          Dorsalis pedis pulses are 2+ on the right side and 2+ on the left side.       Posterior tibial pulses are 2+ on the right side  and 2+ on the left side.  Pulmonary:     Effort: Pulmonary effort is normal. No respiratory distress.     Breath sounds: Normal breath sounds. No wheezing, rhonchi or rales.  Chest:     Chest wall: Tenderness (bilateral anterior chest wall) present. No deformity or crepitus.  Abdominal:     General: There is no distension.     Palpations: Abdomen is soft.     Tenderness: There is no abdominal tenderness.  Musculoskeletal:     Comments: Lower extremities: Patient has swelling w/ moderate to large effusion to the L knee. No overlying erythema/warmth. No open wounds. AROM intact to all joints with exception of L knee- able to fully extend, able to flex to about 90 degrees- limited secondary to pain. Diffuse anterior tenderness to L knee. Otherwise nontender. No calf tenderness.   Skin:    General: Skin is warm and dry.     Findings: No rash.  Neurological:     Mental Status: He is alert.     Comments: Clear speech. Sensation grossly intact to bilateral lower extremities. 5/5 strength with ankle plantar/dorsiflexion bilaterally.   Psychiatric:        Behavior: Behavior normal.      ED Treatments / Results  Labs (all labs ordered are listed, but only abnormal results are displayed) Labs Reviewed  BASIC METABOLIC PANEL - Abnormal; Notable for the following components:      Result Value   Glucose, Bld 125 (*)    All other components within normal limits  CBC - Abnormal; Notable for the following components:   RDW 16.7 (*)    Platelets 429 (*)    All other components within normal limits  TROPONIN I  BRAIN NATRIURETIC PEPTIDE    EKG None  Radiology Dg Chest 2 View  Result Date: 11/24/2018 CLINICAL DATA:  Shortness of breath. EXAM: CHEST - 2 VIEW COMPARISON:  CTA chest 10/22/2018 FINDINGS: Heart size is normal. Lung volumes are low. Mild pulmonary vascular congestion is present. Mild atelectasis is worse on  the left. No significant airspace consolidation is present. The  visualized soft tissues and bony thorax are unremarkable. IMPRESSION: 1. Low lung volumes with mild bibasilar airspace disease, left greater than right. This likely reflects atelectasis. 2. Mild pulmonary vascular congestion. Electronically Signed   By: Marin Robertshristopher  Mattern M.D.   On: 11/24/2018 15:28   Dg Knee Complete 4 Views Left  Result Date: 11/24/2018 CLINICAL DATA:  Pain and swelling LEFT knee, denies injury, history of gout, COPD, hypertension, asthma, alcohol abuse, smoker EXAM: LEFT KNEE - COMPLETE 4+ VIEW COMPARISON:  02/26/2014 FINDINGS: Osseous demineralization. Diffuse joint space narrowing and marginal spur formation. No acute fracture, dislocation, or bone destruction. Large knee joint effusion. No erosive changes. Scattered atherosclerotic calcifications. IMPRESSION: Degenerative changes and osseous demineralization. No acute abnormalities. Electronically Signed   By: Ulyses SouthwardMark  Boles M.D.   On: 11/24/2018 16:41    Procedures Procedures (including critical care time)  Medications Ordered in ED Medications  ondansetron (ZOFRAN) injection 4 mg (4 mg Intravenous Given 11/24/18 1611)  morphine 4 MG/ML injection 4 mg (4 mg Intravenous Given 11/24/18 1611)     Initial Impression / Assessment and Plan / ED Course  I have reviewed the triage vital signs and the nursing notes.  Pertinent labs & imaging results that were available during my care of the patient were reviewed by me and considered in my medical decision making (see chart for details).   Patient presents to the emergency department with complaints of left knee pain for the past 2 weeks as well as shortness of breath over the past 2 to 3 days  Knee pain: Exam with moderate to large size effusion and mild limitation in knee flexion, able to flex to 90 degrees.  The patient is not having fevers, the joint is not warm or erythematous, doubt septic joint.  No history of injury, x-ray obtained negative for fracture or dislocation.   X-ray does confirm large sized joint effusion, also some degenerative changes.  Patient has a history of gout and states this feels the same, feel this is most likely etiology of patient's discomfort.  Neurovascularly intact distally.  He does not have any lower or upper leg swelling or pain to raise concern for DVT.  He does have a history of DVT and states does not feel at all similar. Knee pain improved following analgesics. Placed in a knee immobilizer & provided crutches. We discussed option of therapeutic arthrocentesis for effusion, this was ultimately declined.   Shortness of breath: On my assessment patient does not appear to be in respiratory distress, his lungs are clear to auscultation bilaterally, he is not tachypneic or hypoxic, SPO2 greater than 95% on room air.  He felt better following neb treatment by EMS.  This does feel fairly similar to previous COPD/asthma issues.  Chest x-ray with some bibasilar airspace disease, likely reflects atelectasis, there is also some mild pulmonary vascular congestion.  Given mild pulmonary vascular congestion and questionable PND/orthopnea BNP was added to work-up, this was normal-doubt acute CHF.  His chest pain is reproducible with chest wall palpation.  EKG does not appear consistent with acute ischemia, delta trop negative, doubt ACS.  Low risk Wells, doubt pulmonary embolism. Likely COPD exacerbation.   Patient ambulatory w/ knee immobilizer without signs of respiratory distress. COPD exacerbation- steroid & azithromycin. Steroids will also cover for gout. Will provide short course of percocoet for pain as well. [place] Controlled Substance reporting System queried. PCP & orthopedics follow up. I discussed results, treatment  plan, need for follow-up, and return precautions with the patient. Provided opportunity for questions, patient confirmed understanding and is in agreement with plan.   Findings and plan of care discussed with supervising  physician Dr. Jeraldine LootsLockwood- in agreement.   Final Clinical Impressions(s) / ED Diagnoses   Final diagnoses:  COPD exacerbation (HCC)  Acute pain of left knee    ED Discharge Orders         Ordered    predniSONE (DELTASONE) 50 MG tablet  Daily with breakfast     11/24/18 1921    oxyCODONE-acetaminophen (PERCOCET/ROXICET) 5-325 MG tablet  Every 6 hours PRN     11/24/18 1921    azithromycin (ZITHROMAX) 250 MG tablet  Daily     11/24/18 1921           Desmond Lopeetrucelli, Tallulah Hosman R, PA-C 11/24/18 2240    Gerhard MunchLockwood, Robert, MD 11/24/18 2350

## 2018-11-24 NOTE — ED Notes (Signed)
Provided patient a sandwich and a ginger ale with permission from [place].

## 2018-11-24 NOTE — ED Triage Notes (Signed)
Per EMS:  Patient is coming from home Pt c/o shortness of breath. Pt is alert and oriented x4. Pt reports a hx of asthma and reports he started feeling short of breath x2 days.  EMS reports ronchi in all fields and has had 10 of albuterol 0.5mg  of atrovent

## 2018-11-27 ENCOUNTER — Other Ambulatory Visit: Payer: Self-pay

## 2018-11-27 ENCOUNTER — Emergency Department (HOSPITAL_COMMUNITY): Payer: Medicaid Other

## 2018-11-27 ENCOUNTER — Emergency Department (HOSPITAL_COMMUNITY)
Admission: EM | Admit: 2018-11-27 | Discharge: 2018-11-28 | Disposition: A | Discharge status: Home / Self Care | Payer: Medicaid Other | Attending: Emergency Medicine | Admitting: Emergency Medicine

## 2018-11-27 DIAGNOSIS — Z7984 Long term (current) use of oral hypoglycemic drugs: Secondary | ICD-10-CM | POA: Insufficient documentation

## 2018-11-27 DIAGNOSIS — R569 Unspecified convulsions: Secondary | ICD-10-CM | POA: Diagnosis not present

## 2018-11-27 DIAGNOSIS — F10920 Alcohol use, unspecified with intoxication, uncomplicated: Secondary | ICD-10-CM | POA: Diagnosis not present

## 2018-11-27 DIAGNOSIS — Y908 Blood alcohol level of 240 mg/100 ml or more: Secondary | ICD-10-CM | POA: Insufficient documentation

## 2018-11-27 DIAGNOSIS — R062 Wheezing: Secondary | ICD-10-CM | POA: Diagnosis not present

## 2018-11-27 DIAGNOSIS — E119 Type 2 diabetes mellitus without complications: Secondary | ICD-10-CM | POA: Insufficient documentation

## 2018-11-27 DIAGNOSIS — F17210 Nicotine dependence, cigarettes, uncomplicated: Secondary | ICD-10-CM | POA: Diagnosis not present

## 2018-11-27 DIAGNOSIS — J449 Chronic obstructive pulmonary disease, unspecified: Secondary | ICD-10-CM | POA: Insufficient documentation

## 2018-11-27 DIAGNOSIS — I10 Essential (primary) hypertension: Secondary | ICD-10-CM | POA: Diagnosis not present

## 2018-11-27 LAB — COMPREHENSIVE METABOLIC PANEL
ALBUMIN: 3.6 g/dL (ref 3.5–5.0)
ALK PHOS: 54 U/L (ref 38–126)
ALT: 19 U/L (ref 0–44)
AST: 31 U/L (ref 15–41)
Anion gap: 8 (ref 5–15)
BUN: 27 mg/dL — ABNORMAL HIGH (ref 6–20)
CO2: 28 mmol/L (ref 22–32)
Calcium: 9.6 mg/dL (ref 8.9–10.3)
Chloride: 105 mmol/L (ref 98–111)
Creatinine, Ser: 1.31 mg/dL — ABNORMAL HIGH (ref 0.61–1.24)
GFR calc Af Amer: >60 mL/min (ref >60)
GFR calc non Af Amer: >60 mL/min (ref >60)
Glucose, Bld: 118 mg/dL — ABNORMAL HIGH (ref 70–99)
Potassium: 4 mmol/L (ref 3.5–5.1)
Sodium: 141 mmol/L (ref 135–145)
Total Bilirubin: 1.1 mg/dL (ref 0.3–1.2)
Total Protein: 7.2 g/dL (ref 6.5–8.1)

## 2018-11-27 LAB — CBC WITH DIFFERENTIAL/PLATELET
Abs Immature Granulocytes: 0.02 10*3/uL (ref 0.00–0.07)
Basophils Absolute: 0 10*3/uL (ref 0.0–0.1)
Basophils Relative: 1 %
Eosinophils Absolute: 0.1 10*3/uL (ref 0.0–0.5)
Eosinophils Relative: 1 %
HCT: 43.7 % (ref 39.0–52.0)
HEMOGLOBIN: 14.1 g/dL (ref 13.0–17.0)
Immature Granulocytes: 0 %
Lymphocytes Relative: 29 %
Lymphs Abs: 2.1 10*3/uL (ref 0.7–4.0)
MCH: 29 pg (ref 26.0–34.0)
MCHC: 32.3 g/dL (ref 30.0–36.0)
MCV: 89.7 fL (ref 80.0–100.0)
Monocytes Absolute: 0.5 10*3/uL (ref 0.1–1.0)
Monocytes Relative: 7 %
Neutro Abs: 4.6 10*3/uL (ref 1.7–7.7)
Neutrophils Relative %: 62 %
Platelets: 443 10*3/uL — ABNORMAL HIGH (ref 150–400)
RBC: 4.87 MIL/uL (ref 4.22–5.81)
RDW: 16.3 % — ABNORMAL HIGH (ref 11.5–15.5)
WBC: 7.3 10*3/uL (ref 4.0–10.5)
nRBC: 0 % (ref 0.0–0.2)

## 2018-11-27 LAB — CBG MONITORING, ED: Glucose-Capillary: 106 mg/dL — ABNORMAL HIGH (ref 70–99)

## 2018-11-27 LAB — ETHANOL: Alcohol, Ethyl (B): 250 mg/dL — ABNORMAL HIGH (ref <10)

## 2018-11-27 IMAGING — CT CT HEAD W/O CM
4 series · 15 of 47 positions shown, 17 images · non-contrast
Comparison: CT of the head performed [DATE]

CLINICAL DATA: Acute onset of grand mal seizure.

EXAM:
CT HEAD WITHOUT CONTRAST
TECHNIQUE: Contiguous axial images were obtained from the base of the skull
through the vertex without intravenous contrast.

[Series 3: head wo · axial · 0.41mm/px · z∈[-208,-88]mm · 7 of 34 slices shown, 9 images]
[im 5/34  brain]
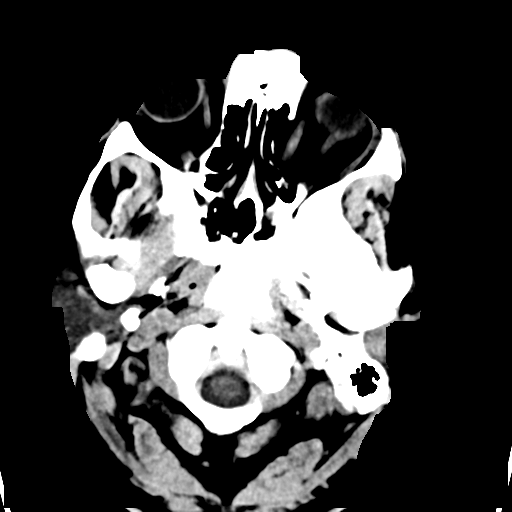
[im 5/34  bone]
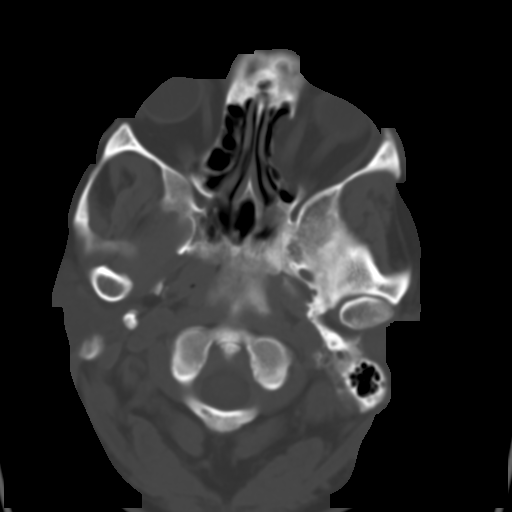
[im 9/34  brain]
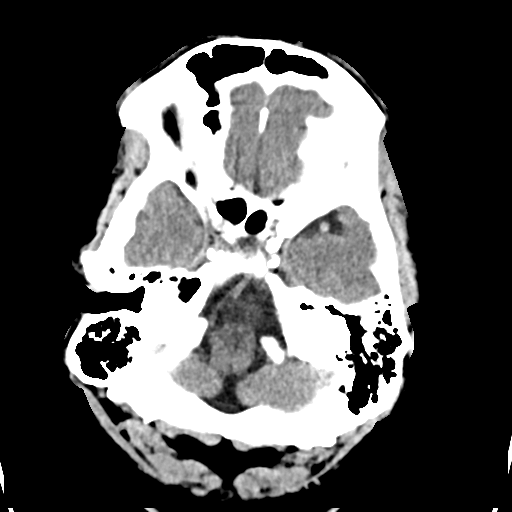
[im 13/34  brain]
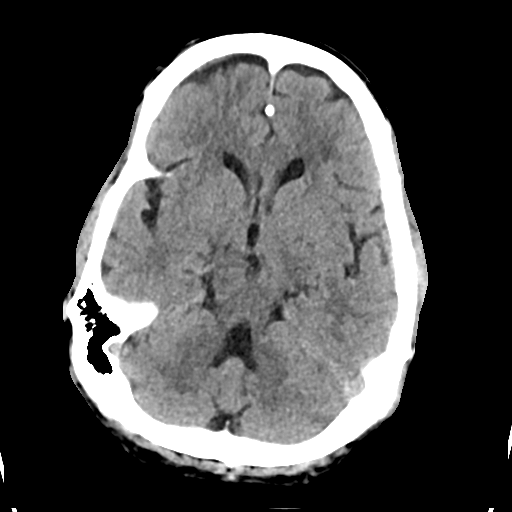
[im 17/34  brain]
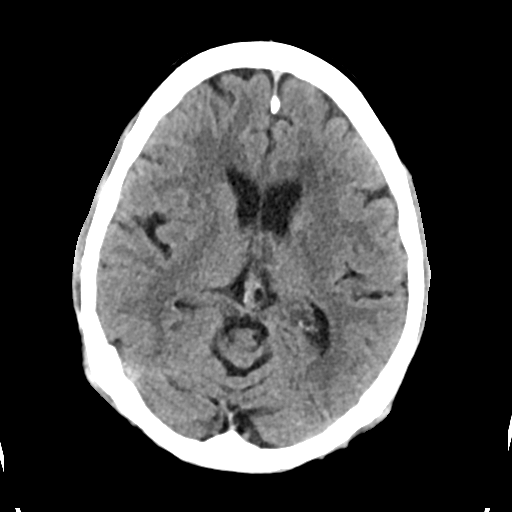
[im 21/34  brain]
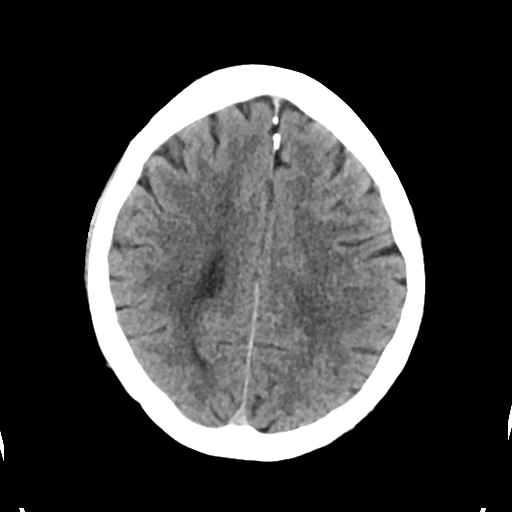
[im 21/34  bone]
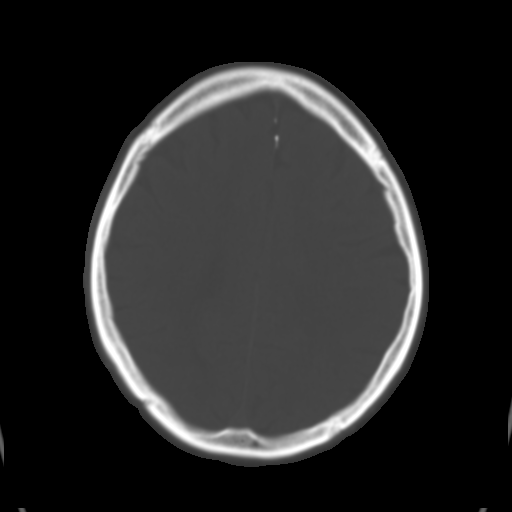
[im 25/34  brain]
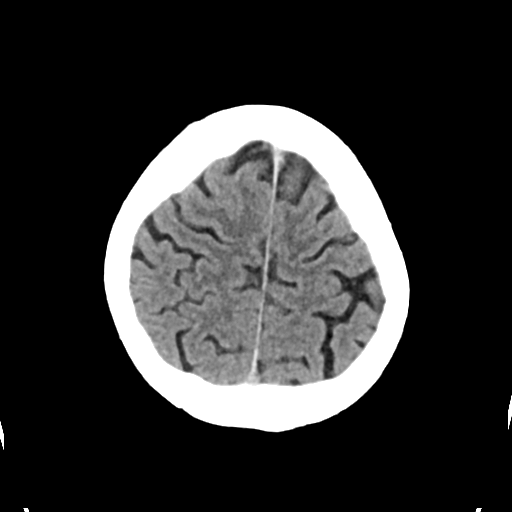
[im 29/34  brain]
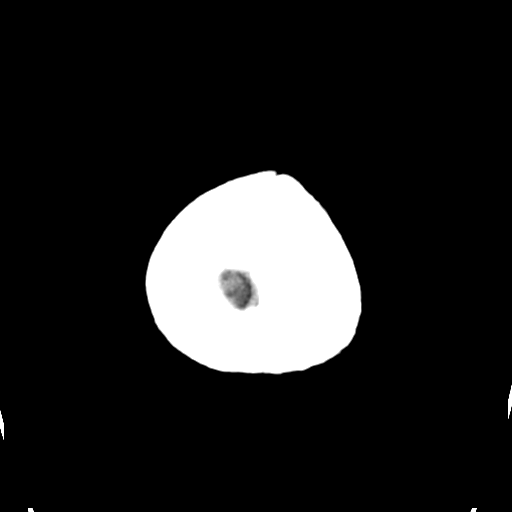

[Series 4: head bone · axial · 0.41mm/px · z∈[-212,-196]mm · 2 of 83 slices shown]
[im 9/83  bone]
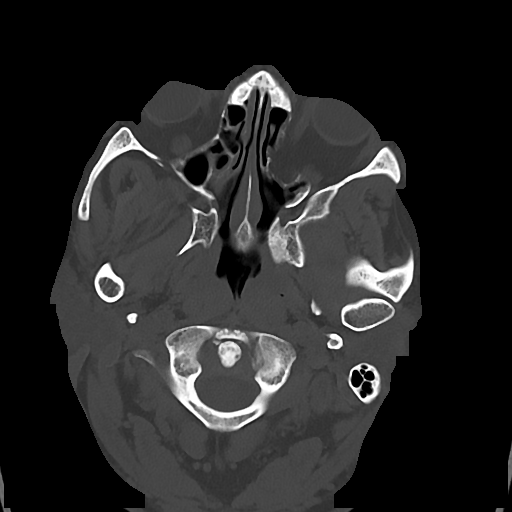
[im 17/83  bone]
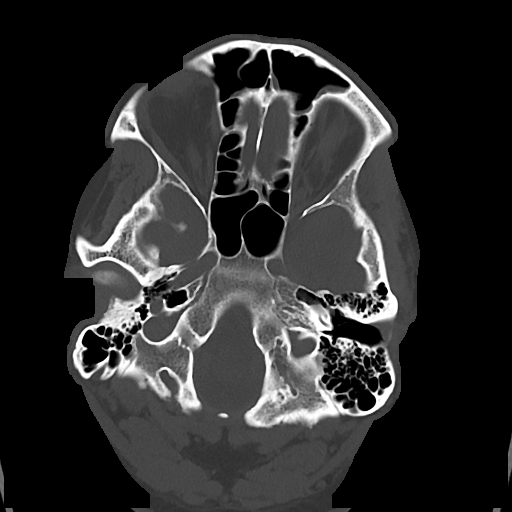

[Series 5: cor soft · coronal · 0.32mm/px · 3 of 74 slices shown]
[im 25/74  brain]
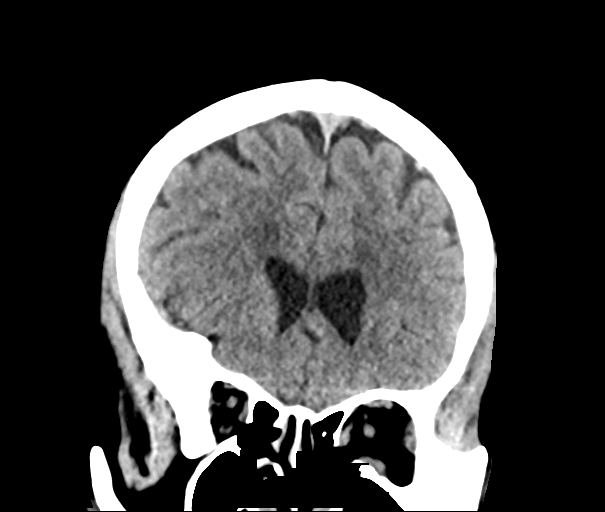
[im 33/74  brain]
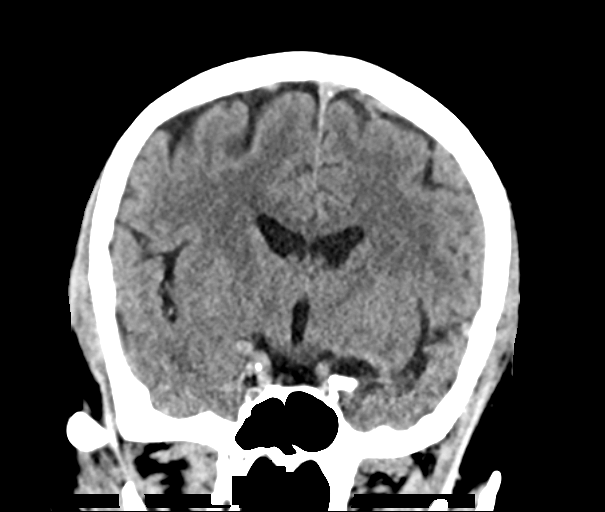
[im 41/74  brain]
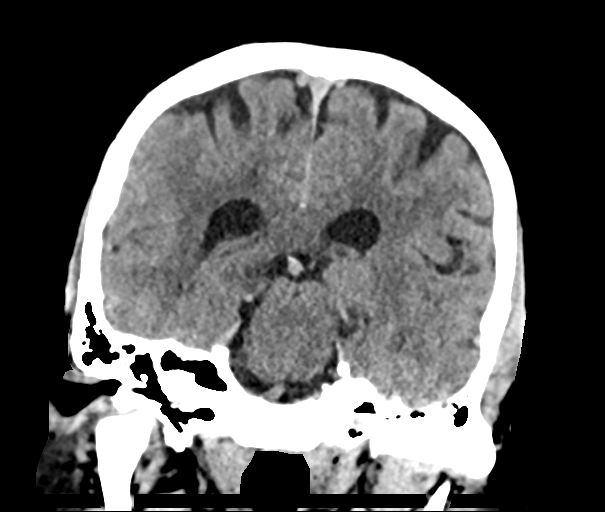

[Series 6: sag soft · sagittal · 0.32mm/px · 3 of 67 slices shown]
[im 23/67  brain]
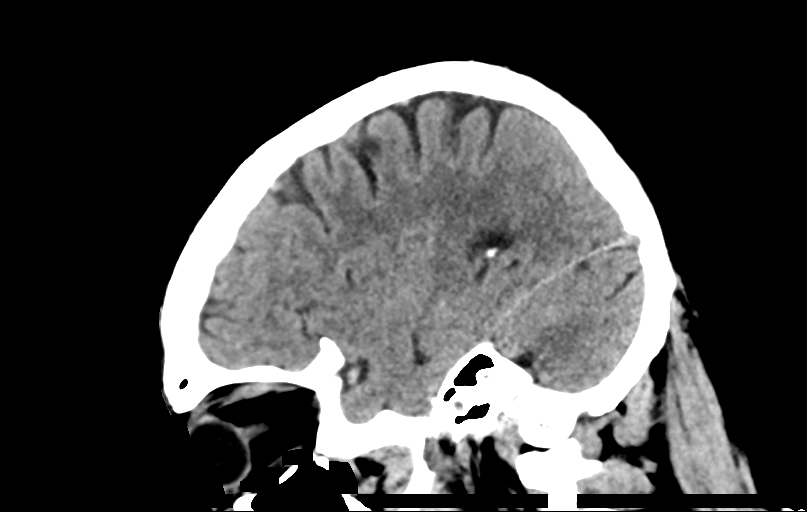
[im 34/67  brain]
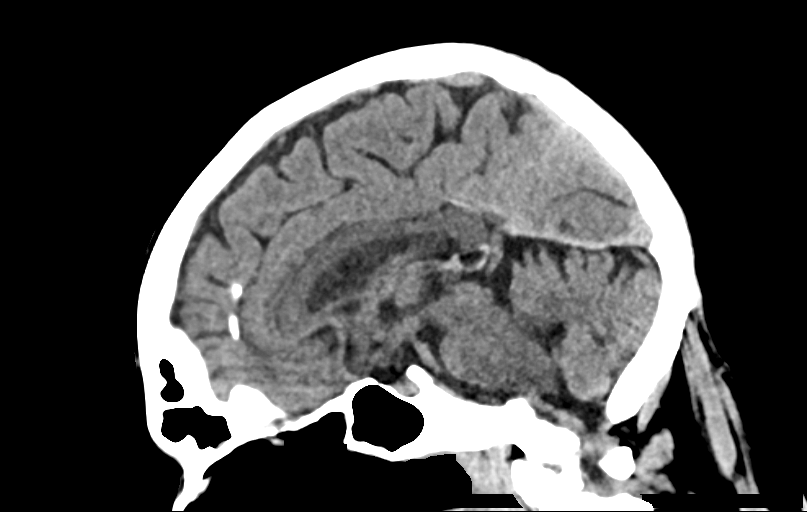
[im 45/67  brain]
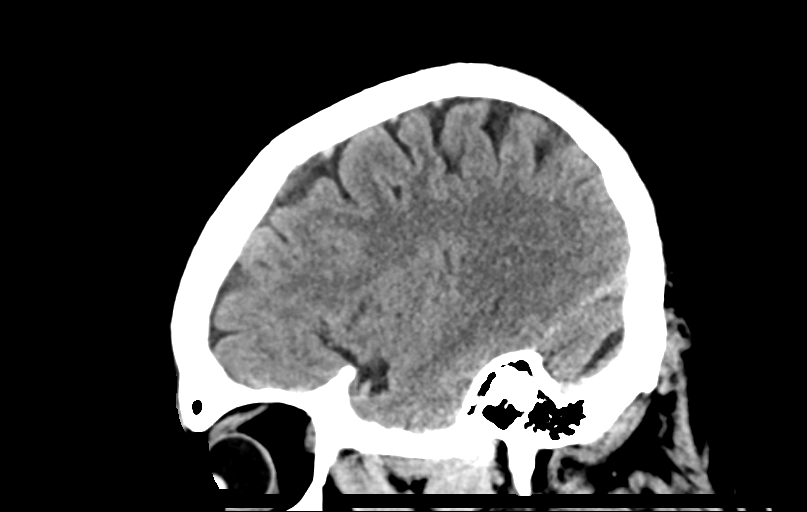

[15 of 47 positions shown; findings below may reference images not displayed]

FINDINGS: Brain: No evidence of acute infarction, hemorrhage, hydrocephalus,
extra-axial collection or mass lesion/mass effect.

Mild periventricular and subcortical white matter change likely
reflects small vessel ischemic microangiopathy. Mild cerebellar
atrophy is noted.

The brainstem and fourth ventricle are within normal limits. The
third and lateral ventricles, and basal ganglia are unremarkable in
appearance. The cerebral hemispheres are symmetric in appearance,
with normal gray-white differentiation. No mass effect or midline
shift is seen.

Vascular: No hyperdense vessel or unexpected calcification.

Skull: There is no evidence of fracture; visualized osseous
structures are unremarkable in appearance.

Sinuses/Orbits: The visualized portions of the orbits are within
normal limits. The paranasal sinuses and mastoid air cells are
well-aerated.

Other: No significant soft tissue abnormalities are seen.
IMPRESSION: 1. No acute intracranial pathology seen on CT.
2. Mild small vessel ischemic microangiopathy.
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
## 2018-11-28 LAB — URINALYSIS, ROUTINE W REFLEX MICROSCOPIC
Bacteria, UA: NONE SEEN
Bilirubin Urine: NEGATIVE
Glucose, UA: NEGATIVE mg/dL
Ketones, ur: NEGATIVE mg/dL
Leukocytes, UA: NEGATIVE
Nitrite: NEGATIVE
Protein, ur: NEGATIVE mg/dL
Specific Gravity, Urine: 1.006 (ref 1.005–1.030)
pH: 5 (ref 5.0–8.0)

## 2018-11-28 LAB — I-STAT VENOUS BLOOD GAS, ED
Acid-base deficit: 2 mmol/L (ref 0.0–2.0)
Bicarbonate: 24.5 mmol/L (ref 20.0–28.0)
O2 Saturation: 83 %
PO2 VEN: 51 mmHg — AB (ref 32.0–45.0)
TCO2: 26 mmol/L (ref 22–32)
pCO2, Ven: 46.2 mmHg (ref 44.0–60.0)
pH, Ven: 7.333 (ref 7.250–7.430)

## 2018-11-28 LAB — RAPID URINE DRUG SCREEN, HOSP PERFORMED
Amphetamines: NOT DETECTED
BENZODIAZEPINES: NOT DETECTED
Barbiturates: NOT DETECTED
Cocaine: NOT DETECTED
Opiates: NOT DETECTED
Tetrahydrocannabinol: NOT DETECTED

## 2018-11-28 NOTE — ED Provider Notes (Signed)
Patient now awake, alert, ambulatory. No seizure like activity. Labs reviewed and are reassuring. D/c to jail.   Shaune PollackIsaacs, Peregrine Nolt, MD 11/28/18 [id number]

## 2018-11-28 NOTE — ED Notes (Signed)
Pt verbalizes understanding of d/c instructions. Pt taken to [employer] car via wheelchair at discharge with all belongings.

## 2018-12-24 ENCOUNTER — Other Ambulatory Visit: Payer: Self-pay

## 2018-12-24 ENCOUNTER — Emergency Department (HOSPITAL_COMMUNITY)
Admission: EM | Admit: 2018-12-24 | Discharge: 2018-12-25 | Disposition: A | Discharge status: Home / Self Care | Payer: Medicaid Other | Attending: Emergency Medicine | Admitting: Emergency Medicine

## 2018-12-24 ENCOUNTER — Encounter (HOSPITAL_COMMUNITY): Payer: Self-pay

## 2018-12-24 DIAGNOSIS — Z7984 Long term (current) use of oral hypoglycemic drugs: Secondary | ICD-10-CM | POA: Diagnosis not present

## 2018-12-24 DIAGNOSIS — W010XXA Fall on same level from slipping, tripping and stumbling without subsequent striking against object, initial encounter: Secondary | ICD-10-CM | POA: Diagnosis not present

## 2018-12-24 DIAGNOSIS — F10920 Alcohol use, unspecified with intoxication, uncomplicated: Secondary | ICD-10-CM

## 2018-12-24 DIAGNOSIS — I10 Essential (primary) hypertension: Secondary | ICD-10-CM | POA: Insufficient documentation

## 2018-12-24 DIAGNOSIS — Y929 Unspecified place or not applicable: Secondary | ICD-10-CM | POA: Insufficient documentation

## 2018-12-24 DIAGNOSIS — F10120 Alcohol abuse with intoxication, uncomplicated: Secondary | ICD-10-CM | POA: Insufficient documentation

## 2018-12-24 DIAGNOSIS — F1410 Cocaine abuse, uncomplicated: Secondary | ICD-10-CM | POA: Insufficient documentation

## 2018-12-24 DIAGNOSIS — S8002XA Contusion of left knee, initial encounter: Secondary | ICD-10-CM | POA: Diagnosis not present

## 2018-12-24 DIAGNOSIS — F17210 Nicotine dependence, cigarettes, uncomplicated: Secondary | ICD-10-CM | POA: Diagnosis not present

## 2018-12-24 DIAGNOSIS — Y998 Other external cause status: Secondary | ICD-10-CM | POA: Insufficient documentation

## 2018-12-24 DIAGNOSIS — Y9389 Activity, other specified: Secondary | ICD-10-CM | POA: Diagnosis not present

## 2018-12-24 DIAGNOSIS — E119 Type 2 diabetes mellitus without complications: Secondary | ICD-10-CM | POA: Insufficient documentation

## 2018-12-24 DIAGNOSIS — Z7901 Long term (current) use of anticoagulants: Secondary | ICD-10-CM | POA: Diagnosis not present

## 2018-12-24 DIAGNOSIS — J449 Chronic obstructive pulmonary disease, unspecified: Secondary | ICD-10-CM | POA: Insufficient documentation

## 2018-12-24 DIAGNOSIS — Z7982 Long term (current) use of aspirin: Secondary | ICD-10-CM | POA: Insufficient documentation

## 2018-12-24 DIAGNOSIS — J45909 Unspecified asthma, uncomplicated: Secondary | ICD-10-CM | POA: Insufficient documentation

## 2018-12-24 DIAGNOSIS — R569 Unspecified convulsions: Secondary | ICD-10-CM

## 2018-12-24 DIAGNOSIS — S8000XA Contusion of unspecified knee, initial encounter: Secondary | ICD-10-CM

## 2018-12-24 DIAGNOSIS — Z79899 Other long term (current) drug therapy: Secondary | ICD-10-CM | POA: Insufficient documentation

## 2018-12-24 DIAGNOSIS — S8992XA Unspecified injury of left lower leg, initial encounter: Secondary | ICD-10-CM | POA: Diagnosis present

## 2018-12-24 LAB — CBG MONITORING, ED: Glucose-Capillary: 101 mg/dL — ABNORMAL HIGH (ref 70–99)
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
## 2018-12-25 ENCOUNTER — Emergency Department (HOSPITAL_COMMUNITY): Payer: Medicaid Other

## 2018-12-25 IMAGING — CT CT HEAD W/O CM
3 of 4 series · 15 of 47 positions shown, 18 images · non-contrast
Comparison: Head CT [DATE]

CLINICAL DATA: Seizure, fall

EXAM:
CT HEAD WITHOUT CONTRAST
TECHNIQUE: Contiguous axial images were obtained from the base of the skull
through the vertex without intravenous contrast.

[Series 2: head wo · axial · 0.47mm/px · z∈[-191,-71]mm · 9 of 30 slices shown, 12 images]
[im 3/30  brain]
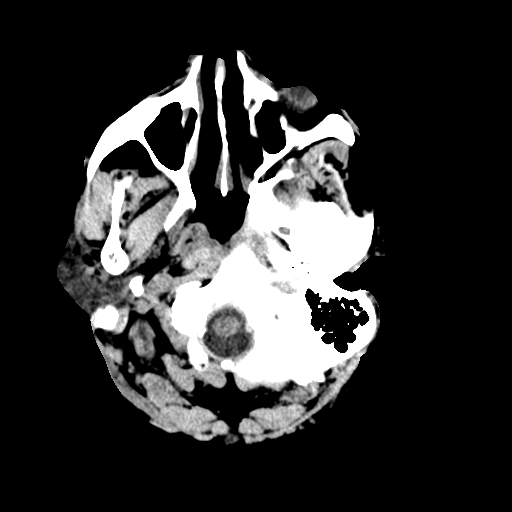
[im 3/30  bone]
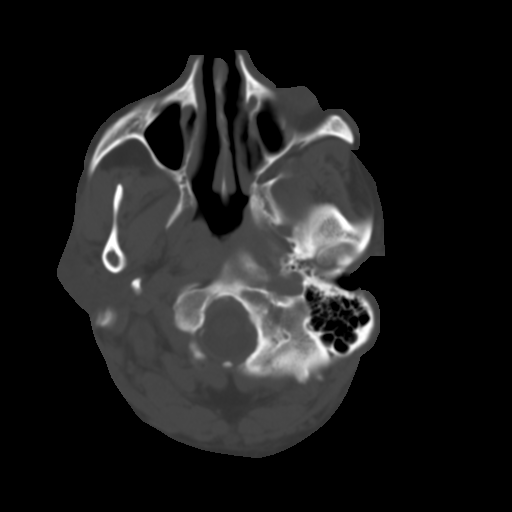
[im 6/30  brain]
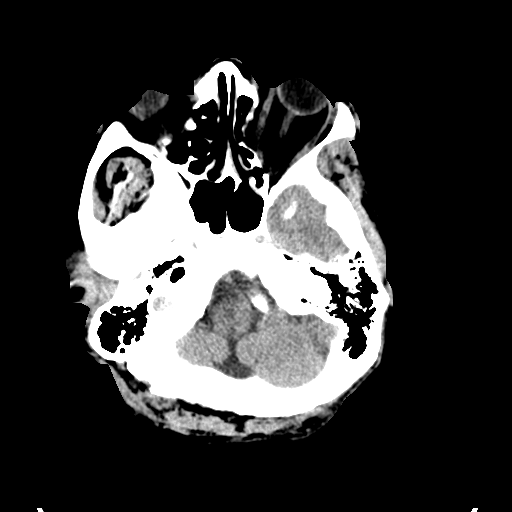
[im 9/30  brain]
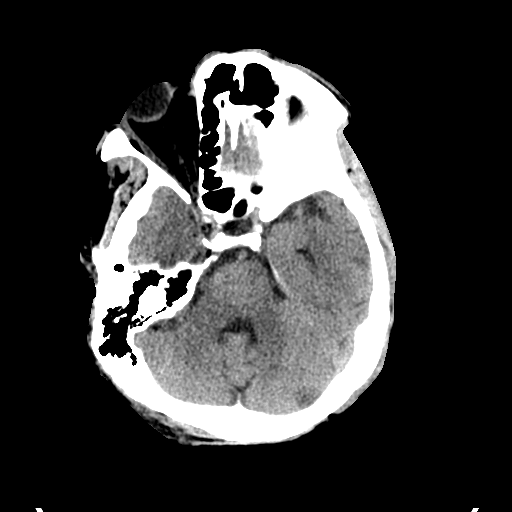
[im 12/30  brain]
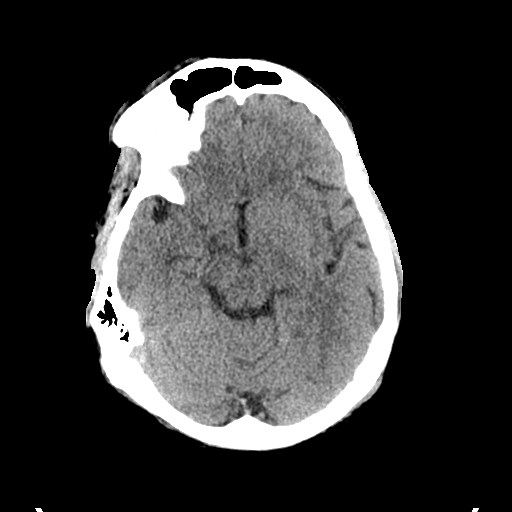
[im 15/30  brain]
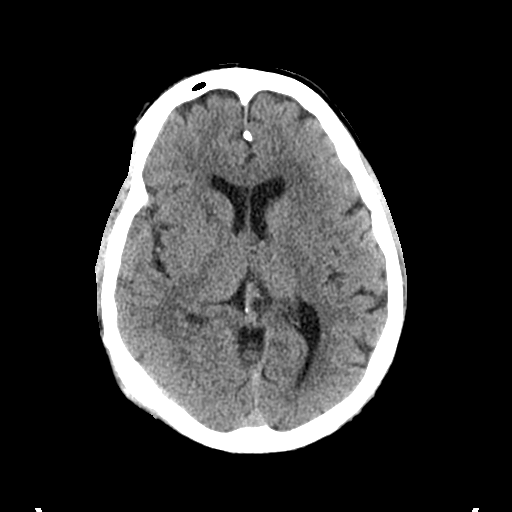
[im 15/30  bone]
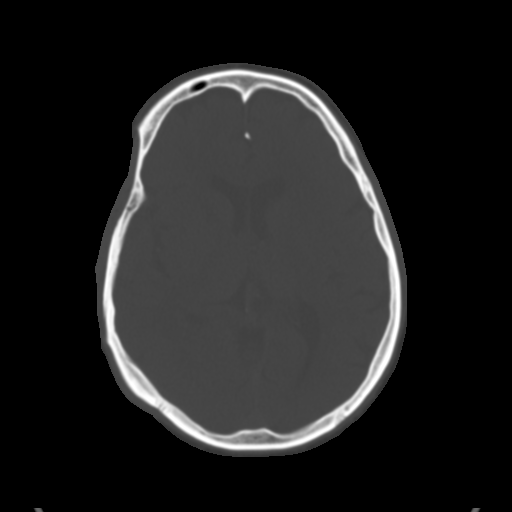
[im 18/30  brain]
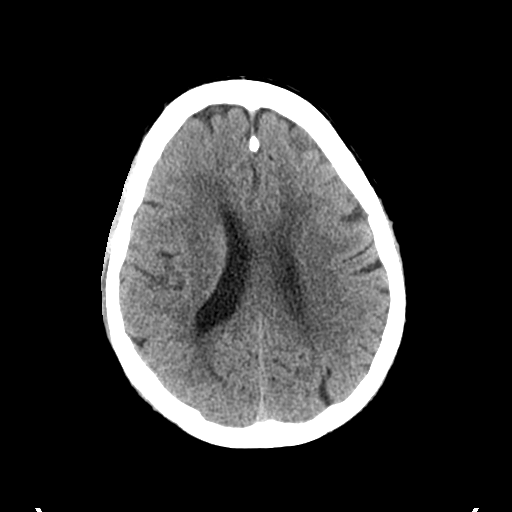
[im 21/30  brain]
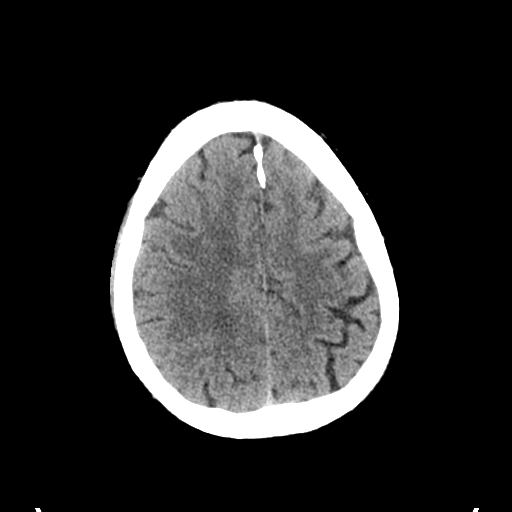
[im 24/30  brain]
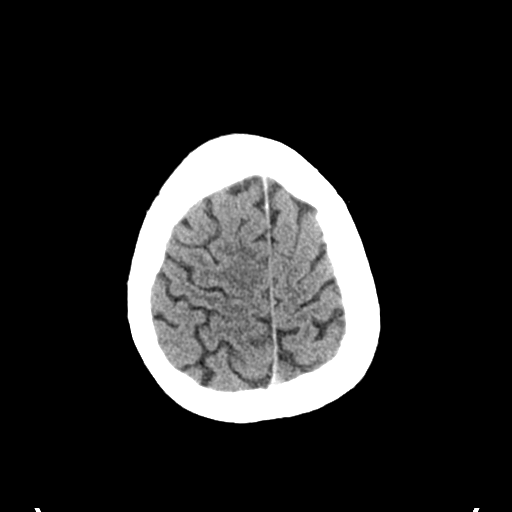
[im 27/30  brain]
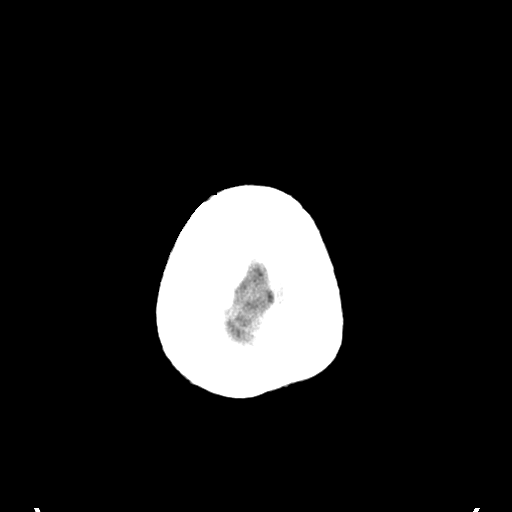
[im 27/30  bone]
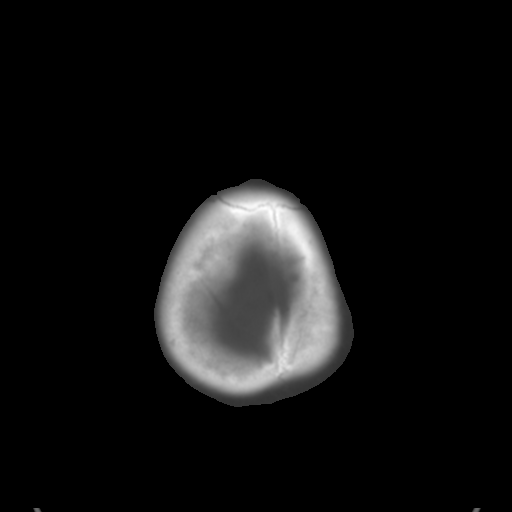

[Series 4: coronal soft tissue · coronal · 0.29mm/px · 3 of 63 slices shown]
[im 21/63  brain]
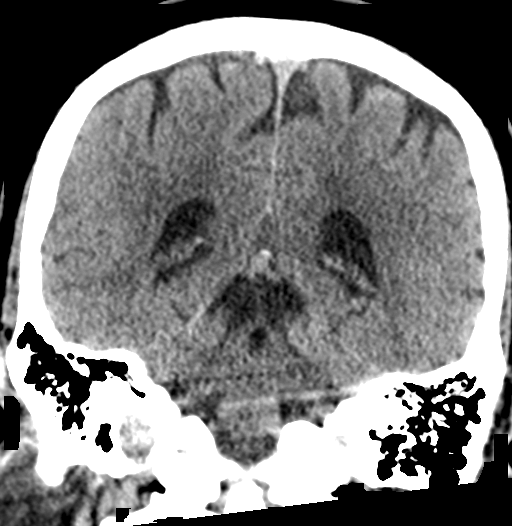
[im 28/63  brain]
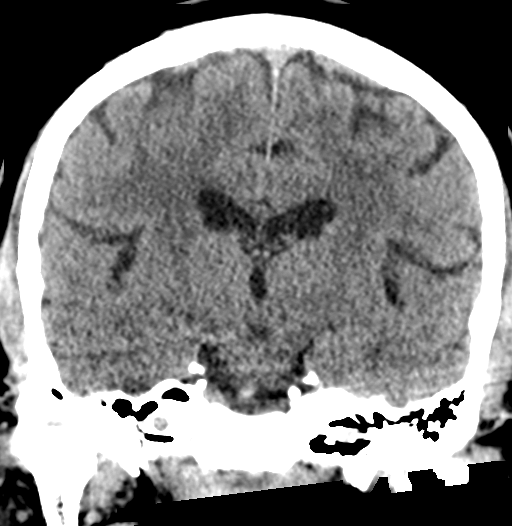
[im 35/63  brain]
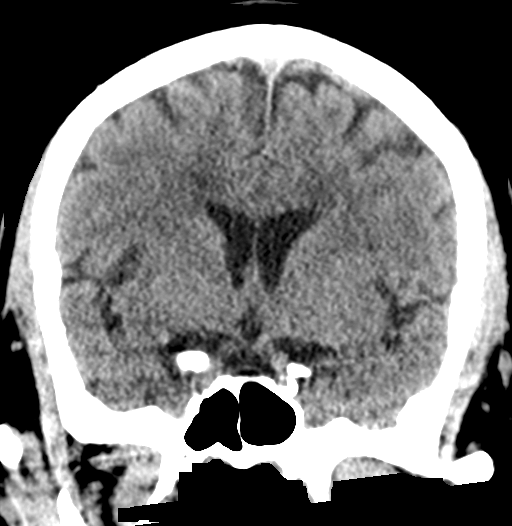

[Series 5: sagittal soft tissue · sagittal · 0.29mm/px · 3 of 50 slices shown]
[im 17/50  brain]
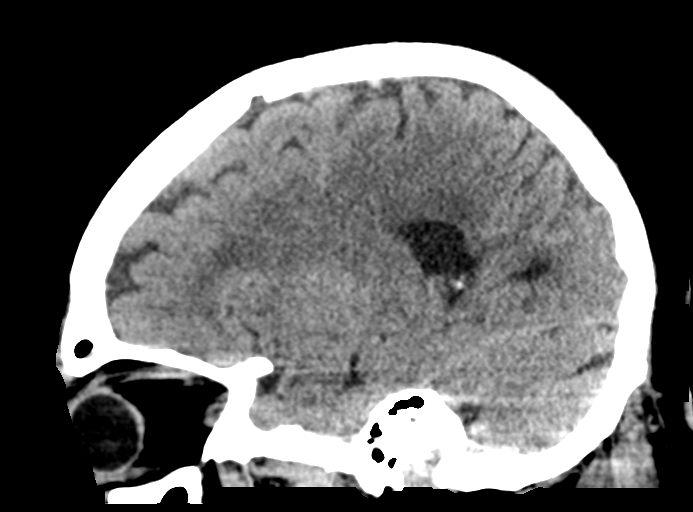
[im 25/50  brain]
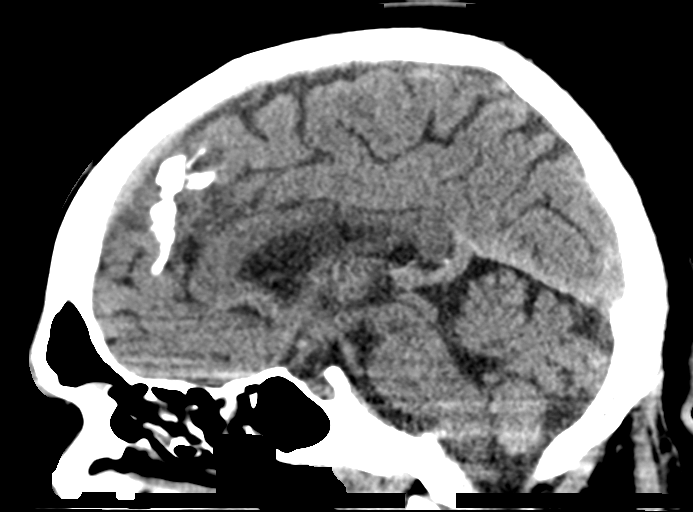
[im 33/50  brain]
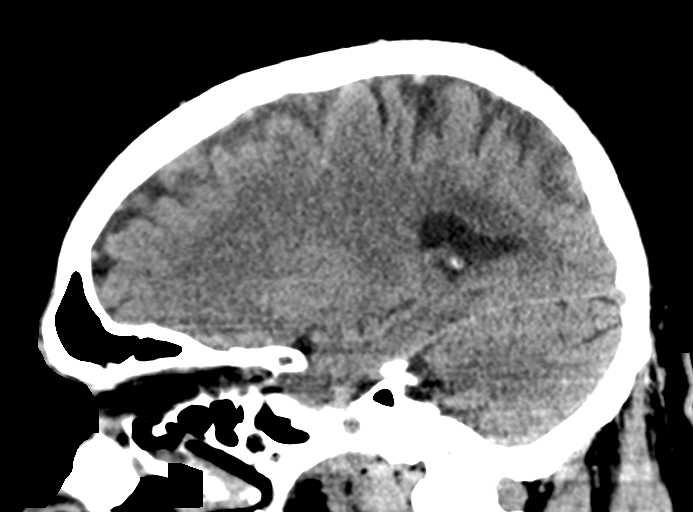

[15 of 47 positions shown; findings below may reference images not displayed]

FINDINGS: Brain: No acute territorial infarction, hemorrhage or intracranial
mass. Mild hypodensity in the white matter consistent with small
vessel ischemic change. Stable ventricle size

Vascular: No hyperdense vessel.  Carotid vascular calcification

Skull: Normal. Negative for fracture or focal lesion.

Sinuses/Orbits: Old fracture medial wall left orbit. Mucosal
thickening in the maxillary and ethmoid sinuses.

Other: None
IMPRESSION: 1. No CT evidence for acute intracranial abnormality.
2. Small vessel ischemic changes of the white matter.

## 2018-12-25 IMAGING — CR DG KNEE COMPLETE 4+V*L*
4 series · 4 of 4 positions shown · non-contrast
Comparison: Left knee radiograph dated [DATE]

CLINICAL DATA: 57-year-old male with fall and left knee pain.

EXAM:
LEFT KNEE - COMPLETE 4+ VIEW

[x knee obl left (1 of 2)]
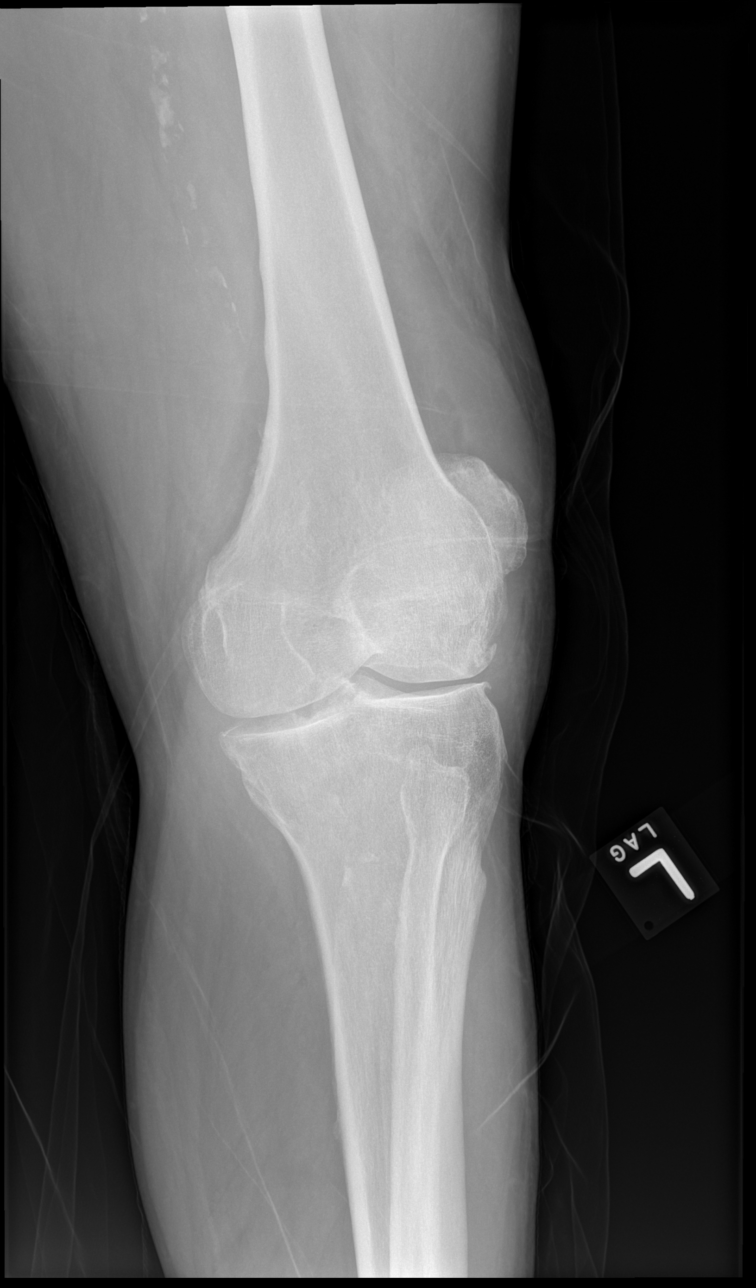

[x knee ap left]
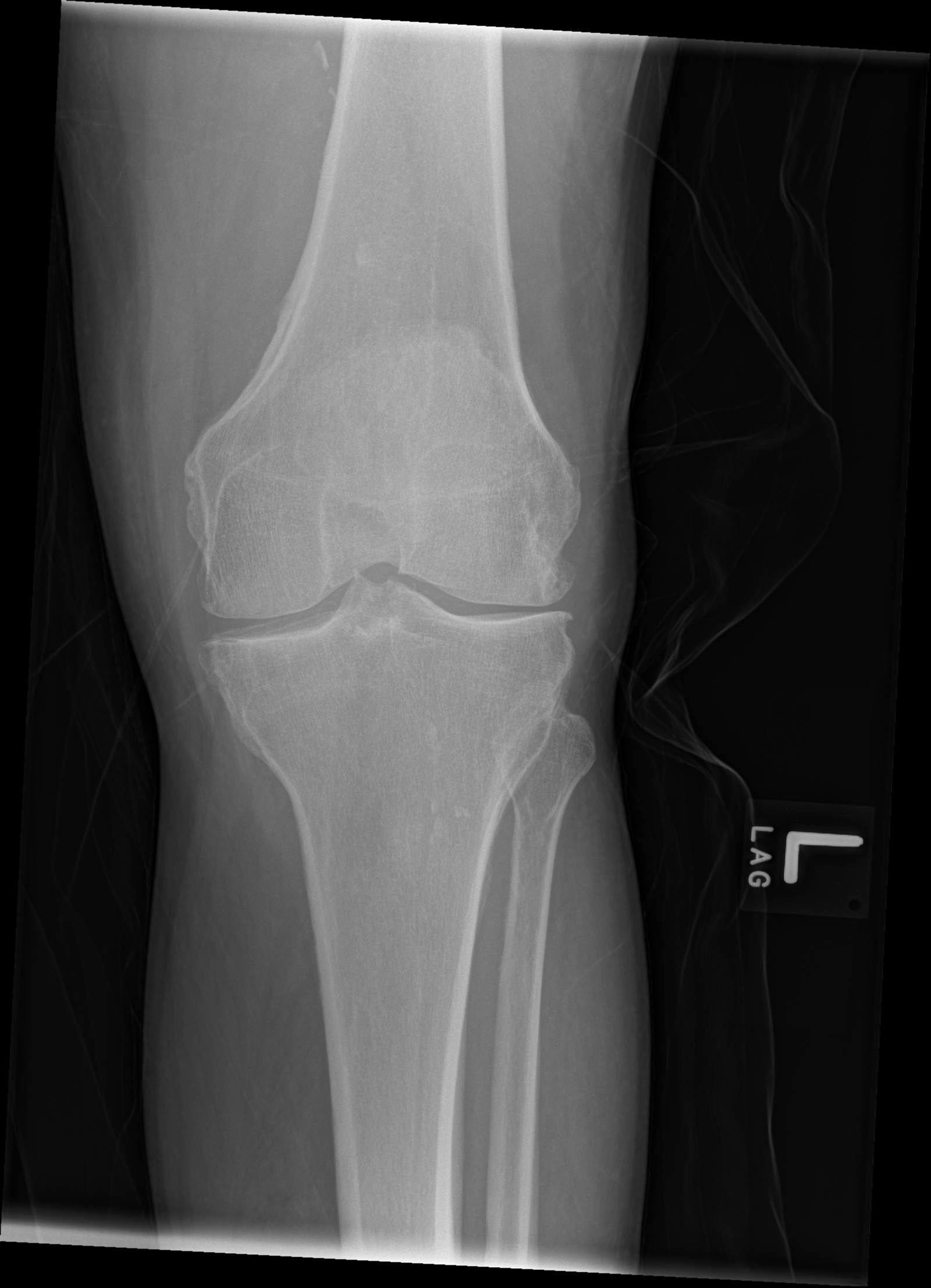

[x knee obl left (2 of 2)]
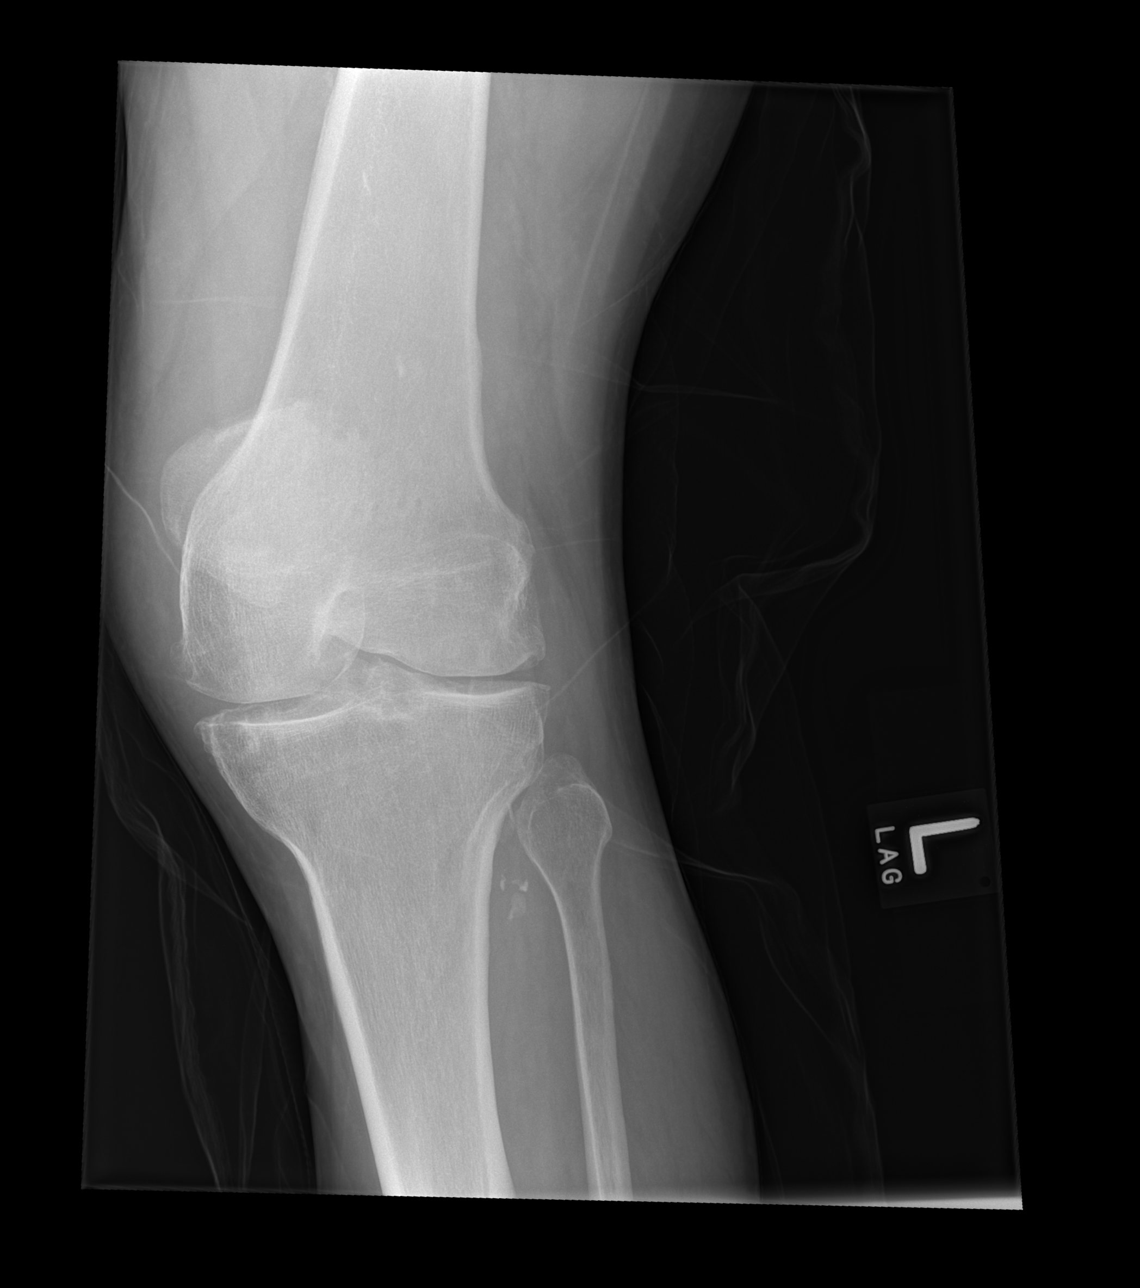

[x knee lat left]
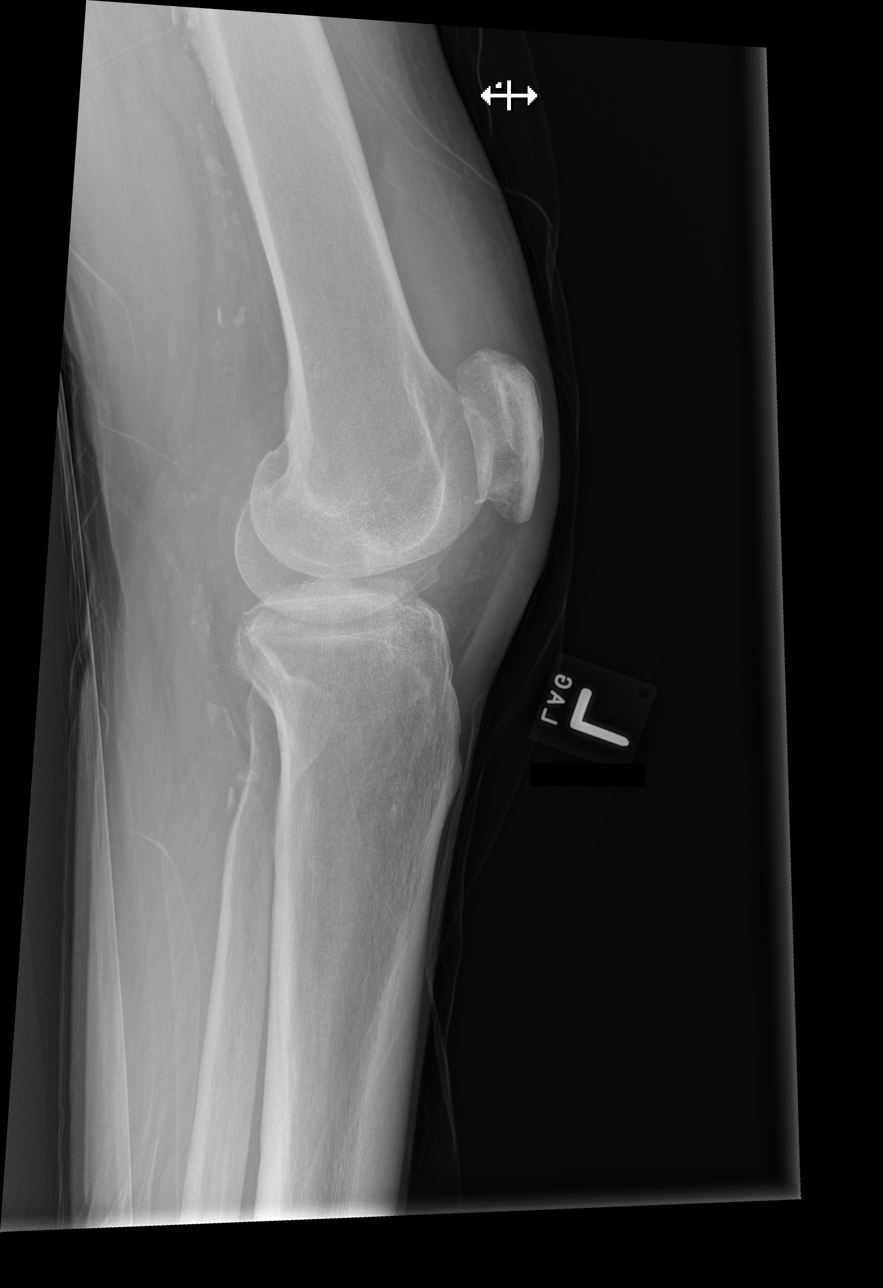

[4 of 4 positions shown; findings below may reference images not displayed]

FINDINGS: There is no acute fracture or dislocation. There is mild arthritic
changes with joint space narrowing primarily involving the lateral
compartment similar to prior radiograph. There is a moderate size
suprapatellar effusion, decreased since the prior radiograph.
Vascular calcifications noted.
IMPRESSION: 1. No acute fracture or dislocation.
2. Arthritic changes.
3. Decrease in the size of the suprapatellar effusion.

## 2018-12-25 NOTE — ED Provider Notes (Signed)
[facility] Provider Note   CSN: [id number] Arrival date & time: 12/24/18  2225     History   Chief Complaint Chief Complaint  Patient presents with  . Alcohol Intoxication    HPI Darryl Hurley is a 58 y.o. male.  HPI  58 year old male with history of COPD, gout, alcohol abuse comes in with chief complaint of fall and seizures.  He also has a history of seizures, and the wife reports that patient is typically not adherent to his scheduled doses.  According to the wife, patient has been drinking last night and he had a fall followed by seizure.  Patient is now complaining of knee pain.  Patient and the wife denies any recent fevers, chills, cough, abdominal pain.  Past Medical History:  Diagnosis Date  . Alcohol abuse   . Angina pectoris   . Arthritis   . Asthma   . Chronic lower back pain   . COPD (chronic obstructive pulmonary disease) (HCC)    on home O2 2L  . Diabetes mellitus without complication (HCC)   . Exertional shortness of breath   . Gout   . Hypertension   . Seizures [facility](HCC)     Patient Active Problem List   Diagnosis Date Noted  . Lactic acidosis 01/06/2016  . Lactic acid acidosis 01/06/2016  . Alcohol abuse with intoxication (HCC) 01/06/2016  . COPD with acute exacerbation (HCC) 02/02/2014  . HCAP (healthcare-associated pneumonia) 02/02/2014  . Increased anion gap metabolic acidosis 02/02/2014  . Cocaine abuse (HCC) 11/18/2013  . Fever 11/17/2013  . Anemia 11/17/2013  . Hypokalemia 11/17/2013  . Polyarthritis 11/17/2013  . Dehydration 11/17/2013  . Type 2 diabetes mellitus (HCC) 05/14/2013  . Chronic obstructive pulmonary disease (COPD) (HCC) 05/12/2013  . Gout 05/12/2013  . Headache 05/12/2013  . Asthma 05/04/2013  . TOBACCO ABUSE 04/27/2009  . Uncontrolled hypertension 04/27/2009    Past Surgical History:  Procedure Laterality Date  . NO PAST SURGERIES          Home Medications    Prior to  Admission medications   Medication Sig Start Date End Date Taking? Authorizing Provider  albuterol (PROVENTIL HFA;VENTOLIN HFA) 108 (90 BASE) MCG/ACT inhaler Inhale 1 puff into the lungs every 6 (six) hours as needed for wheezing or shortness of breath.    [provider]  allopurinol (ZYLOPRIM) 100 MG tablet Take 100 mg by mouth daily.    [provider]  amLODipine (NORVASC) 10 MG tablet Take 10 mg by mouth daily. 08/06/18   [provider]  aspirin 325 MG EC tablet Take 325 mg by mouth daily.    [provider]  azithromycin (ZITHROMAX) 250 MG tablet Take 1 tablet (250 mg total) by mouth daily. Take first 2 tablets together, then 1 every day until finished. 11/24/18   Petrucelli, Samantha R, PA-C  cloNIDine (CATAPRES) 0.1 MG tablet Take 0.1 mg by mouth 2 (two) times daily. 08/15/18   [provider]  FLOVENT HFA 110 MCG/ACT inhaler Inhale 1 puff into the lungs 2 (two) times daily as needed (shortness of breath).  08/06/18   [provider]  fluticasone (FLONASE) 50 MCG/ACT nasal spray Place 2 sprays into both nostrils daily.  08/06/18   [provider]  gabapentin (NEURONTIN) 300 MG capsule Take 300 mg by mouth 2 (two) times daily. 10/20/18   [provider]  hydrochlorothiazide (HYDRODIURIL) 25 MG tablet Take 25 mg by mouth daily.  07/14/18   [provider]  losartan (COZAAR) 100 MG tablet Take 1 tablet (100 mg total) by mouth daily. 12/24/17   Street, [place], PA-C  metFORMIN (GLUCOPHAGE) 1000 MG tablet Take 1 tablet (1,000 mg total) by mouth 2 (two) times daily with a meal. 01/08/16   Alison Murray, MD  oxyCODONE-acetaminophen (PERCOCET/ROXICET) 5-325 MG tablet Take 1-2 tablets by mouth every 6 (six) hours as needed for severe pain. 11/24/18   Petrucelli, Pleas Koch, PA-C  Rivaroxaban 15 & 20 MG TBPK Take as directed on package: Start with one 15mg  tablet by mouth twice a day with food. On Day 22, switch to one 20mg  tablet  once a day with food. Patient taking differently: Take 20 mg by mouth daily.  09/05/18   Renne Crigler, PA-C  rosuvastatin (CRESTOR) 40 MG tablet Take 40 mg by mouth daily.    [provider]  tiZANidine (ZANAFLEX) 4 MG capsule Take 4 mg by mouth as needed for muscle spasms.  06/25/18   [provider]    Family History Family History  Problem Relation Age of Onset  . Diabetes type II Mother   . Depression Father   . Suicidality Father   . Asthma Brother     Social History Social History   Tobacco Use  . Smoking status: Current Every Day Smoker    Packs/day: 0.50    Years: 40.00    Pack years: 20.00    Types: Cigarettes  . Smokeless tobacco: Never Used  . Tobacco comment: 05/12/2013 'eased off smoking since last month"  Substance Use Topics  . Alcohol use: Yes    Comment: 4 40 oz beers per day  . Drug use: No    Types: "Crack" cocaine     Allergies   Nsaids and Toradol [ketorolac tromethamine]   Review of Systems Review of Systems  Constitutional: Positive for activity change. Negative for fever.  Respiratory: Negative for shortness of breath.   Cardiovascular: Negative for chest pain.  Gastrointestinal: Negative for abdominal pain.  Musculoskeletal: Positive for arthralgias.  Neurological: Positive for seizures.  Hematological: Does not bruise/bleed easily.  All other systems reviewed and are negative.    Physical Exam Updated Vital Signs BP (!) 110/96 (BP Location: Right Arm)   Pulse 95   Temp 98.3 F (36.8 C) (Oral)   Resp 15   SpO2 97%   Physical Exam Vitals signs and nursing note reviewed.  Constitutional:      Appearance: He is well-developed.  HENT:     Head: Atraumatic.  Neck:     Musculoskeletal: Neck supple.     Comments: No midline c-spine tenderness, pt able to turn head to 45 degrees bilaterally without any pain and able to flex neck to the chest and extend without any pain or neurologic symptoms.  Cardiovascular:      Rate and Rhythm: Normal rate.  Pulmonary:     Effort: Pulmonary effort is normal.  Musculoskeletal:        General: Tenderness present.     Comments: Patient is tenderness over the left knee, where there is some edema.  There is no calor, no deformity  Skin:    General: Skin is warm.  Neurological:     Mental Status: He is alert and oriented to person, place, and time.      ED Treatments / Results  Labs (all labs ordered are listed, but only abnormal results are displayed) Labs Reviewed  CBG MONITORING, ED - Abnormal; Notable for the following components:  Result Value   Glucose-Capillary 101 (*)    All other components within normal limits    EKG None  Radiology Ct Head Wo Contrast  Result Date: 12/25/2018 CLINICAL DATA:  Seizure, fall EXAM: CT HEAD WITHOUT CONTRAST TECHNIQUE: Contiguous axial images were obtained from the base of the skull through the vertex without intravenous contrast. COMPARISON:  Head CT 11/27/2018 FINDINGS: Brain: No acute territorial infarction, hemorrhage or intracranial mass. Mild hypodensity in the white matter consistent with small vessel ischemic change. Stable ventricle size Vascular: No hyperdense vessel.  Carotid vascular calcification Skull: Normal. Negative for fracture or focal lesion. Sinuses/Orbits: Old fracture medial wall left orbit. Mucosal thickening in the maxillary and ethmoid sinuses. Other: None IMPRESSION: 1. No CT evidence for acute intracranial abnormality. 2. Small vessel ischemic changes of the white matter. Electronically Signed   By: Jasmine PangKim  Fujinaga M.D.   On: 12/25/2018 03:31   Dg Knee Complete 4 Views Left  Result Date: 12/25/2018 CLINICAL DATA:  58 year old male with fall and left knee pain. EXAM: LEFT KNEE - COMPLETE 4+ VIEW COMPARISON:  Left knee radiograph dated 11/24/2018 FINDINGS: There is no acute fracture or dislocation. There is mild arthritic changes with joint space narrowing primarily involving the lateral  compartment similar to prior radiograph. There is a moderate size suprapatellar effusion, decreased since the prior radiograph. Vascular calcifications noted. IMPRESSION: 1. No acute fracture or dislocation. 2. Arthritic changes. 3. Decrease in the size of the suprapatellar effusion. Electronically Signed   By: Elgie CollardArash  Radparvar M.D.   On: 12/25/2018 03:20    Procedures Procedures (including critical care time)  Medications Ordered in ED Medications - No data to display   Initial Impression / Assessment and Plan / ED Course  I have reviewed the triage vital signs and the nursing notes.  Pertinent labs & imaging results that were available during my care of the patient were reviewed by me and considered in my medical decision making (see chart for details).     Patient comes in with chief complaint of fall and seizure.  He has history of alcohol abuse.  He also has history of seizures, gout and other medical comorbidities.  On exam patient is noted to have left knee tenderness.  Given his fall and intoxication, we will get a CT scan of his brain.  The seizures likely are related to his alcohol use, although they also could be because of his fall and traumatic brain injury.  Patient has no midline C-spine tenderness.  He appears clinically sober, and we are comfortable clearing his C-spine clinically.  Final Clinical Impressions(s) / ED Diagnoses   Final diagnoses:  Contusion of knee, unspecified laterality, initial encounter  Seizure [facility](HCC)  Alcoholic intoxication without complication [facility](HCC)    ED Discharge Orders    None       Derwood KaplanNanavati, Capitola Ladson, MD 12/25/18 [phone]

## 2018-12-25 NOTE — Discharge Instructions (Addendum)
Results in the ER are normal. X-rays do not reveal any fracture or brain bleed. Take your seizure medications and refrain from heavy alcohol use.

## 2019-02-01 NOTE — Progress Notes (Signed)
Patient is here for follow up visit.  Subjective:   Darryl Hurley, male    DOB: September 17, 1961, 58 y.o.   MRN: [id number]   Chief Complaint  Patient presents with  . Chest Pain     HPI  56 -year-old African-American male with hypertension, type II diabetes mellitus, tobacco abuse, gout, midly reduced LVEF and mildly abnormal stress test (01/2018).  At last visit in 09.2019, given absense of angina symptoms, I had recommended continued medical managmeent. I increased hu hydralazine to 50 mg tid. I also re-emphasized importance of tobacco cessation.   Since his last visit, patient has had at least two ED visits with alcohol intoxication and seizures. He was diagnosed with acute right femoral DVT in 09/2018. He has been on Xarelto since then. He continues to have bilateral knee pain.  He reports compliance with medications. He reports knee pain.    Past Medical History:  Diagnosis Date  . Alcohol abuse   . Angina pectoris   . Arthritis   . Asthma   . Chronic lower back pain   . COPD (chronic obstructive pulmonary disease) (HCC)    on home O2 2L  . Diabetes mellitus without complication (HCC)   . Exertional shortness of breath   . Gout   . Hypertension   . Seizures (HCC)      Past Surgical History:  Procedure Laterality Date  . NO PAST SURGERIES       Social History   Socioeconomic History  . Marital status: Single    Spouse name: Not on file  . Number of children: 2  . Years of education: 11th  . Highest education level: Not on file  Occupational History    Employer: OTHER    Comment: disability  Social Needs  . Financial resource strain: Not on file  . Food insecurity:    Worry: Not on file    Inability: Not on file  . Transportation needs:    Medical: Not on file    Non-medical: Not on file  Tobacco Use  . Smoking status: Current Every Day Smoker    Packs/day: 0.50    Years: 40.00    Pack years: 20.00    Types: Cigarettes  . Smokeless tobacco:  Never Used  . Tobacco comment: 05/12/2013 'eased off smoking since last month"  Substance and Sexual Activity  . Alcohol use: Yes    Comment: 4 40 oz beers per day, 1 pint of liqor in a day  . Drug use: No    Types: "Crack" cocaine  . Sexual activity: Never  Lifestyle  . Physical activity:    Days per week: Not on file    Minutes per session: Not on file  . Stress: Not on file  Relationships  . Social connections:    Talks on phone: Not on file    Gets together: Not on file    Attends religious service: Not on file    Active member of club or organization: Not on file    Attends meetings of clubs or organizations: Not on file    Relationship status: Not on file  . Intimate partner violence:    Fear of current or ex partner: Not on file    Emotionally abused: Not on file    Physically abused: Not on file    Forced sexual activity: Not on file  Other Topics Concern  . Not on file  Social History Narrative   Patient lives at home with  friend.   Caffeine Use: none     Current Outpatient Medications on File Prior to Visit  Medication Sig Dispense Refill  . allopurinol (ZYLOPRIM) 100 MG tablet Take 100 mg by mouth daily.    Marland Kitchen amLODipine (NORVASC) 10 MG tablet Take 10 mg by mouth daily.  2  . cloNIDine (CATAPRES) 0.1 MG tablet Take 0.1 mg by mouth 2 (two) times daily.  3  . doxycycline (VIBRAMYCIN) 100 MG capsule Take 100 mg by mouth 2 (two) times daily.    Marland Kitchen FLOVENT HFA 110 MCG/ACT inhaler Inhale 1 puff into the lungs 2 (two) times daily as needed (shortness of breath).   3  . hydrochlorothiazide (HYDRODIURIL) 25 MG tablet Take 25 mg by mouth daily.   2  . losartan (COZAAR) 100 MG tablet Take 1 tablet (100 mg total) by mouth daily. 30 tablet 0  . metFORMIN (GLUCOPHAGE) 1000 MG tablet Take 1 tablet (1,000 mg total) by mouth 2 (two) times daily with a meal. 60 tablet 0  . Rivaroxaban 15 & 20 MG TBPK Take as directed on package: Start with one 15mg  tablet by mouth twice a day with  food. On Day 22, switch to one 20mg  tablet once a day with food. (Patient taking differently: Take 20 mg by mouth daily. ) 51 each 0  . rosuvastatin (CRESTOR) 40 MG tablet Take 40 mg by mouth daily.    Marland Kitchen tiZANidine (ZANAFLEX) 4 MG capsule Take 4 mg by mouth as needed for muscle spasms.   1  . albuterol (PROVENTIL HFA;VENTOLIN HFA) 108 (90 BASE) MCG/ACT inhaler Inhale 1 puff into the lungs every 6 (six) hours as needed for wheezing or shortness of breath.    . oxyCODONE-acetaminophen (PERCOCET/ROXICET) 5-325 MG tablet Take 1-2 tablets by mouth every 6 (six) hours as needed for severe pain. (Patient not taking: Reported on 02/02/2019) 6 tablet 0   No current facility-administered medications on file prior to visit.     Cardiovascular studies:  RLE venous duplex US 09/2018: Final Interpretation: Right: Findings consistent with acute deep vein thrombosis involving the right peroneal vein, and right posterior tibial vein. No cystic structure found in the popliteal fossa. Left: No evidence of common femoral vein obstruction   Echocardiogram 02/11/2018: Left ventricle cavity is normal in size. Mild concentric hypertrophy of the left ventricle. Mild decrease in global wall motion. Visual EF is 45-50%. Doppler evidence of grade I (impaired) diastolic dysfunction, normal LAP. Calculated EF 45%. Left atrial cavity is mildly dilated. Inadequate tricuspid regurgitation jet to estimate pulmonary artery pressure IVC is dilated with blunted respiratory response. Estimated RA pressure 10-15 mmHg.  Lexiscan myoview stress test 01/31/2018:  1. Pharmacologic stress testing was performed with intravenous administration of .4 mg of Lexiscan over a 10-15 seconds infusion. Patient was hypertensive throughout the study, peak BP 200/120 mmHg.  Stress symptoms included dyspnea, dizziness.  2. Exercise capacity not assessed. Stress EKG is non diagnostic for ischemia as it is a pharmacologic stress.  3. The overall  quality of the study is good.  Left ventricular cavity is noted to be normal on the rest and stress studies.  Gated SPECT images reveal normal myocardial thickening and wall motion.  The left ventricular ejection fraction was calculated or visually estimated to be 41%.  SPECT images demonstrate small perfusion abnormality of mild intensity in the mid inferolateral myocardial wall(s) on the stress images with mild reversibility on rest images.  4. Low to intermediate risk study.   Review of Systems  Constitution: Negative for decreased appetite, malaise/fatigue, weight gain and weight loss.  HENT: Negative for congestion.   Eyes: Negative for visual disturbance.  Cardiovascular: Negative for chest pain, dyspnea on exertion, palpitations and syncope.  Respiratory: Negative for shortness of breath.   Endocrine: Negative for cold intolerance.  Hematologic/Lymphatic: Does not bruise/bleed easily.  Skin: Negative for itching and rash.  Musculoskeletal: Negative for myalgias.       H/o DVT  Gastrointestinal: Negative for abdominal pain, nausea and vomiting.  Genitourinary: Negative for dysuria.  Neurological: Negative for dizziness and weakness.  Psychiatric/Behavioral: Positive for substance abuse (Alcohol). The patient is not nervous/anxious.   All other systems reviewed and are negative.      Objective:    Vitals:   02/02/19 1439 02/02/19 1443  BP: (!) 188/143 (!) 158/110  Pulse:    SpO2:       Physical Exam  Constitutional: He is oriented to person, place, and time. He appears well-developed and well-nourished. No distress.  HENT:  Head: Normocephalic and atraumatic.  Eyes: Pupils are equal, round, and reactive to light. Conjunctivae are normal.  Neck: No JVD present.  Cardiovascular: Normal rate, regular rhythm and intact distal pulses.  No murmur heard. Pulmonary/Chest: Effort normal and breath sounds normal. He has no wheezes. He has no rales.  Abdominal: Soft. Bowel sounds  are normal. There is no rebound.  Musculoskeletal:        General: No edema.  Lymphadenopathy:    He has no cervical adenopathy.  Neurological: He is alert and oriented to person, place, and time. No cranial nerve deficit.  Skin: Skin is warm and dry.  Psychiatric: He has a normal mood and affect.  Nursing note and vitals reviewed.       Assessment & Recommendations:    89 -year-old African-American male with hypertension, type II diabetes mellitus, tobacco and alcohol abuse, h/o seizures, gout, midly reduced LVEF and mildly abnormal stress test (01/2018).  1. Uncontrolled hypertension Remains his primary problem. He reports knee pain today, which could be the acute trigger. Currently on amlodipine 10 mg, HCTZ 25 mg, clonidine 0.1 mg bid, losartan 100 mg daily. I have not made any change to his medications today. Repeat BP check in 1 week. If elevated, may need additional agents.   2. H/o abnormal stress test: No current angina symptoms. Needs aggressive medical management  3. Alcohol abuse: I strongly recommended weaning off alcohol, but avoid abrupt cessation to avoid withdrawal. Alcohol abuse is likely contributing to his hypertension  4. H/o DVT: F/u w/PCP. I gave him Xarelto 15 mg samples. Follow up with PCP.   Elder NegusManish J Dimond Crotty, MD [facility] Pager: [phone] Office: [phone] If no answer Cell [phone]

## 2019-02-02 ENCOUNTER — Ambulatory Visit: Payer: Self-pay | Admitting: Cardiology

## 2019-02-02 ENCOUNTER — Ambulatory Visit (INDEPENDENT_AMBULATORY_CARE_PROVIDER_SITE_OTHER): Payer: Medicaid Other | Admitting: Cardiology

## 2019-02-02 ENCOUNTER — Encounter: Payer: Self-pay | Admitting: Cardiology

## 2019-02-02 VITALS — BP 158/110 | HR 98 | Ht 65.0 in | Wt 181.0 lb

## 2019-02-02 DIAGNOSIS — R9439 Abnormal result of other cardiovascular function study: Secondary | ICD-10-CM | POA: Diagnosis not present

## 2019-02-02 DIAGNOSIS — I10 Essential (primary) hypertension: Secondary | ICD-10-CM | POA: Diagnosis not present

## 2019-02-02 DIAGNOSIS — F1010 Alcohol abuse, uncomplicated: Secondary | ICD-10-CM

## 2019-02-09 ENCOUNTER — Encounter: Payer: Medicaid Other | Admitting: Cardiology

## 2019-02-09 VITALS — BP 165/120 | HR 91

## 2019-02-09 DIAGNOSIS — I10 Essential (primary) hypertension: Secondary | ICD-10-CM

## 2019-02-16 ENCOUNTER — Other Ambulatory Visit: Payer: Self-pay

## 2019-02-16 ENCOUNTER — Ambulatory Visit (INDEPENDENT_AMBULATORY_CARE_PROVIDER_SITE_OTHER): Payer: Medicaid Other | Admitting: Cardiology

## 2019-02-16 VITALS — BP 155/114 | HR 98 | Ht 65.0 in | Wt 180.0 lb

## 2019-02-16 DIAGNOSIS — I10 Essential (primary) hypertension: Secondary | ICD-10-CM

## 2019-02-16 MED ORDER — LABETALOL HCL 200 MG PO TABS: 200.0000 mg | ORAL_TABLET | Freq: Two times a day (BID) | ORAL | 0 refills | Status: DC

## 2019-02-16 NOTE — Progress Notes (Signed)
Patient's blood pressure still markedly elevated,I will add labetalol 200 mg p.o. b.i.d.  I discussed with him regarding complete abstinence from tobacco and alcohol again.  States that he has not been drinking.  Keep all prior appointments.

## 2019-03-09 ENCOUNTER — Ambulatory Visit: Payer: Self-pay

## 2019-03-17 ENCOUNTER — Emergency Department (HOSPITAL_COMMUNITY): Payer: Medicaid Other

## 2019-03-17 ENCOUNTER — Other Ambulatory Visit: Payer: Self-pay

## 2019-03-17 ENCOUNTER — Inpatient Hospital Stay (HOSPITAL_COMMUNITY)
Admission: EM | Admit: 2019-03-17 | Discharge: 2019-03-18 | DRG: 313 | Disposition: A | Discharge status: Home / Self Care | Payer: Medicaid Other | Attending: Cardiology | Admitting: Cardiology

## 2019-03-17 ENCOUNTER — Encounter (HOSPITAL_COMMUNITY): Payer: Self-pay | Admitting: Emergency Medicine

## 2019-03-17 DIAGNOSIS — Z9119 Patient's noncompliance with other medical treatment and regimen: Secondary | ICD-10-CM | POA: Diagnosis not present

## 2019-03-17 DIAGNOSIS — J449 Chronic obstructive pulmonary disease, unspecified: Secondary | ICD-10-CM | POA: Diagnosis present

## 2019-03-17 DIAGNOSIS — E119 Type 2 diabetes mellitus without complications: Secondary | ICD-10-CM | POA: Diagnosis present

## 2019-03-17 DIAGNOSIS — I10 Essential (primary) hypertension: Secondary | ICD-10-CM | POA: Diagnosis present

## 2019-03-17 DIAGNOSIS — I82409 Acute embolism and thrombosis of unspecified deep veins of unspecified lower extremity: Secondary | ICD-10-CM | POA: Diagnosis not present

## 2019-03-17 DIAGNOSIS — Z833 Family history of diabetes mellitus: Secondary | ICD-10-CM

## 2019-03-17 DIAGNOSIS — Z20828 Contact with and (suspected) exposure to other viral communicable diseases: Secondary | ICD-10-CM | POA: Diagnosis present

## 2019-03-17 DIAGNOSIS — Z86718 Personal history of other venous thrombosis and embolism: Secondary | ICD-10-CM | POA: Diagnosis not present

## 2019-03-17 DIAGNOSIS — I214 Non-ST elevation (NSTEMI) myocardial infarction: Secondary | ICD-10-CM | POA: Diagnosis not present

## 2019-03-17 DIAGNOSIS — Z9981 Dependence on supplemental oxygen: Secondary | ICD-10-CM | POA: Diagnosis not present

## 2019-03-17 DIAGNOSIS — M109 Gout, unspecified: Secondary | ICD-10-CM | POA: Diagnosis present

## 2019-03-17 DIAGNOSIS — R7989 Other specified abnormal findings of blood chemistry: Secondary | ICD-10-CM | POA: Diagnosis not present

## 2019-03-17 DIAGNOSIS — Z7951 Long term (current) use of inhaled steroids: Secondary | ICD-10-CM

## 2019-03-17 DIAGNOSIS — F17210 Nicotine dependence, cigarettes, uncomplicated: Secondary | ICD-10-CM | POA: Diagnosis present

## 2019-03-17 DIAGNOSIS — Z7901 Long term (current) use of anticoagulants: Secondary | ICD-10-CM | POA: Diagnosis not present

## 2019-03-17 DIAGNOSIS — R0789 Other chest pain: Secondary | ICD-10-CM | POA: Diagnosis present

## 2019-03-17 DIAGNOSIS — I249 Acute ischemic heart disease, unspecified: Secondary | ICD-10-CM

## 2019-03-17 DIAGNOSIS — F1010 Alcohol abuse, uncomplicated: Secondary | ICD-10-CM | POA: Diagnosis present

## 2019-03-17 DIAGNOSIS — Z79899 Other long term (current) drug therapy: Secondary | ICD-10-CM | POA: Diagnosis not present

## 2019-03-17 DIAGNOSIS — E1121 Type 2 diabetes mellitus with diabetic nephropathy: Secondary | ICD-10-CM

## 2019-03-17 DIAGNOSIS — M545 Low back pain: Secondary | ICD-10-CM | POA: Diagnosis present

## 2019-03-17 DIAGNOSIS — G8929 Other chronic pain: Secondary | ICD-10-CM | POA: Diagnosis present

## 2019-03-17 DIAGNOSIS — Z7984 Long term (current) use of oral hypoglycemic drugs: Secondary | ICD-10-CM | POA: Diagnosis not present

## 2019-03-17 LAB — BASIC METABOLIC PANEL
Anion gap: 12 (ref 5–15)
BUN: 16 mg/dL (ref 6–20)
CO2: 22 mmol/L (ref 22–32)
Calcium: 9.5 mg/dL (ref 8.9–10.3)
Chloride: 102 mmol/L (ref 98–111)
Creatinine, Ser: 1.45 mg/dL — ABNORMAL HIGH (ref 0.61–1.24)
GFR calc Af Amer: >60 mL/min (ref >60)
GFR calc non Af Amer: 53 mL/min — ABNORMAL LOW (ref >60)
Glucose, Bld: 161 mg/dL — ABNORMAL HIGH (ref 70–99)
Potassium: 3.6 mmol/L (ref 3.5–5.1)
Sodium: 136 mmol/L (ref 135–145)

## 2019-03-17 LAB — CBC WITH DIFFERENTIAL/PLATELET
Abs Immature Granulocytes: 0.03 10*3/uL (ref 0.00–0.07)
Basophils Absolute: 0 10*3/uL (ref 0.0–0.1)
Basophils Relative: 0 %
Eosinophils Absolute: 0 10*3/uL (ref 0.0–0.5)
Eosinophils Relative: 0 %
HCT: 44.5 % (ref 39.0–52.0)
Hemoglobin: 14.4 g/dL (ref 13.0–17.0)
Immature Granulocytes: 0 %
Lymphocytes Relative: 15 %
Lymphs Abs: 1.5 10*3/uL (ref 0.7–4.0)
MCH: 29 pg (ref 26.0–34.0)
MCHC: 32.4 g/dL (ref 30.0–36.0)
MCV: 89.7 fL (ref 80.0–100.0)
Monocytes Absolute: 0.8 10*3/uL (ref 0.1–1.0)
Monocytes Relative: 8 %
Neutro Abs: 7.3 10*3/uL (ref 1.7–7.7)
Neutrophils Relative %: 77 %
Platelets: 394 10*3/uL (ref 150–400)
RBC: 4.96 MIL/uL (ref 4.22–5.81)
RDW: 15.6 % — ABNORMAL HIGH (ref 11.5–15.5)
WBC: 9.7 10*3/uL (ref 4.0–10.5)
nRBC: 0 % (ref 0.0–0.2)

## 2019-03-17 LAB — HEPARIN LEVEL (UNFRACTIONATED): Heparin Unfractionated: <0.1 IU/mL — ABNORMAL LOW (ref 0.30–0.70)

## 2019-03-17 LAB — SARS CORONAVIRUS 2 BY RT PCR (HOSPITAL ORDER, PERFORMED IN ~~LOC~~ HOSPITAL LAB): SARS Coronavirus 2: NEGATIVE

## 2019-03-17 LAB — I-STAT CREATININE, ED: Creatinine, Ser: 1.4 mg/dL — ABNORMAL HIGH (ref 0.61–1.24)

## 2019-03-17 LAB — D-DIMER, QUANTITATIVE: D-Dimer, Quant: 2 ug/mL-FEU — ABNORMAL HIGH (ref 0.00–0.50)

## 2019-03-17 LAB — CBG MONITORING, ED: Glucose-Capillary: 120 mg/dL — ABNORMAL HIGH (ref 70–99)

## 2019-03-17 LAB — TROPONIN I: Troponin I: 0.51 ng/mL (ref <0.03)

## 2019-03-17 LAB — APTT: aPTT: 33 seconds (ref 24–36)

## 2019-03-17 IMAGING — CT CT ANGIO CHEST-ABD-PELV FOR DISSECTION W/ AND WO/W CM
2 of 7 series · 13 of 46 positions shown, 15 images · IV contrast (omnipaque)
Comparison: [DATE] CT chest.

CLINICAL DATA: 57 y/o M; left-sided chest pain radiating to the
left arm.

EXAM:
CT ANGIOGRAPHY CHEST, ABDOMEN AND PELVIS
TECHNIQUE: Multidetector CT imaging through the chest, abdomen and pelvis was
performed using the standard protocol during bolus administration of
intravenous contrast. Multiplanar reconstructed images and MIPs were
obtained and reviewed to evaluate the vascular anatomy.
CONTRAST:  100mL OMNIPAQUE IOHEXOL 350 MG/ML SOLN

[Series 6: arterial · axial · arterial · 0.79mm/px · z∈[+704,+1250]mm · 10 of 307 slices shown, 12 images]
[im 17/307  soft-tissue]
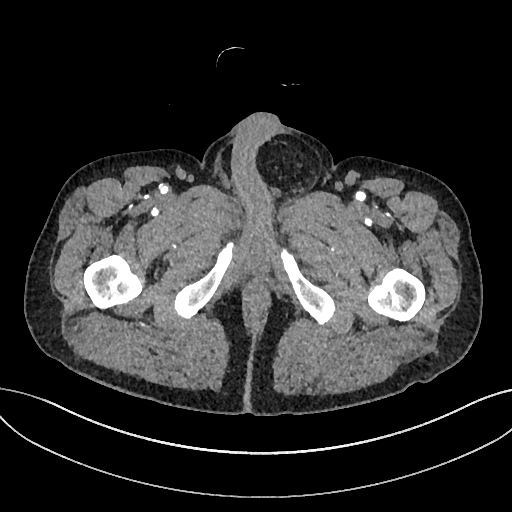
[im 17/307  bone]
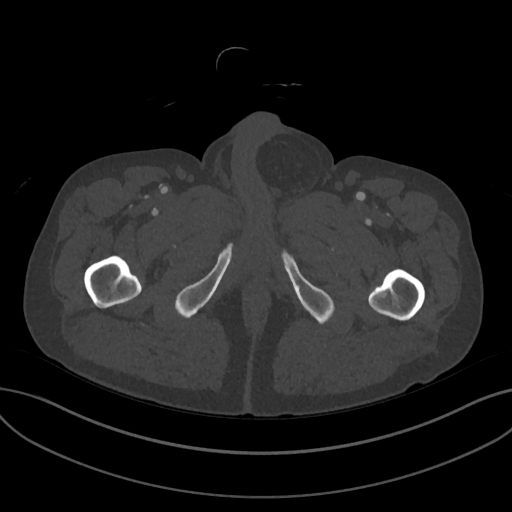
[im 49/307  soft-tissue]
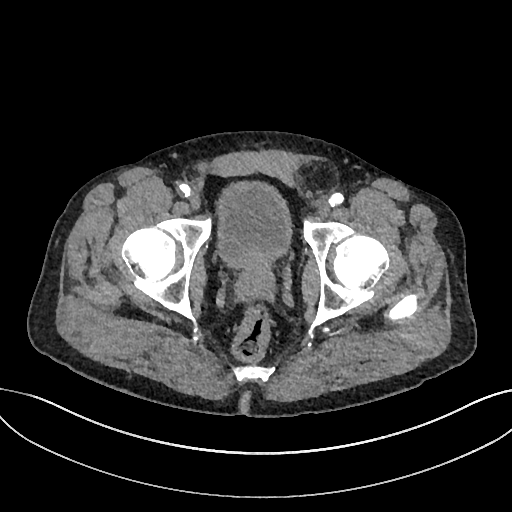
[im 81/307  soft-tissue]
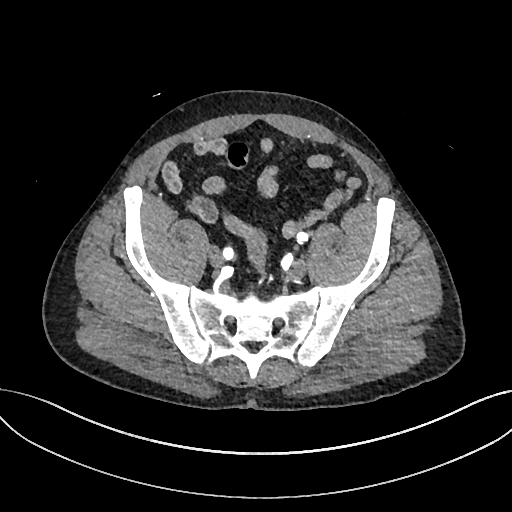
[im 113/307  soft-tissue]
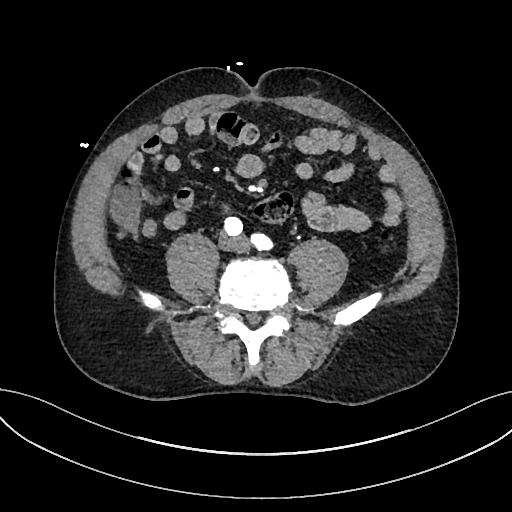
[im 145/307  soft-tissue]
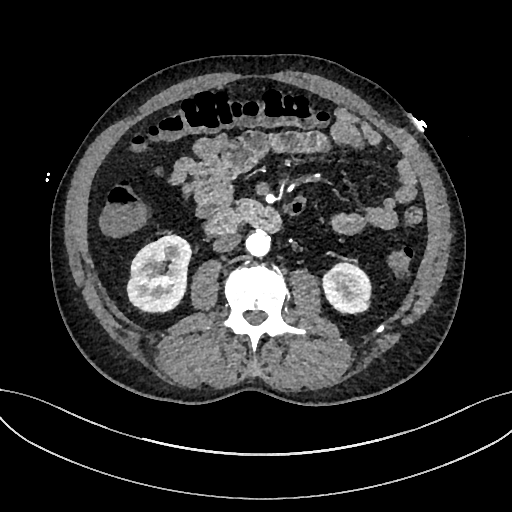
[im 162/307  soft-tissue]
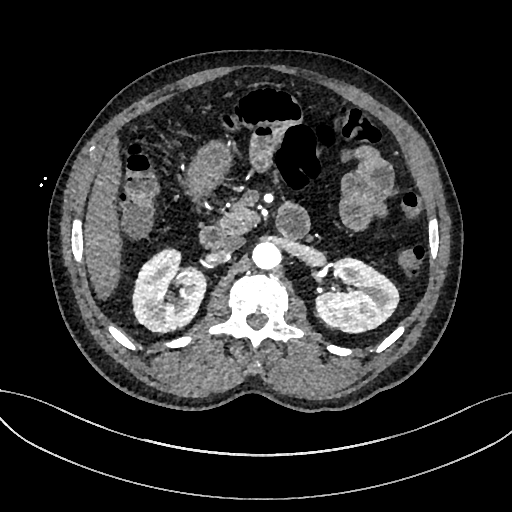
[im 194/307  soft-tissue]
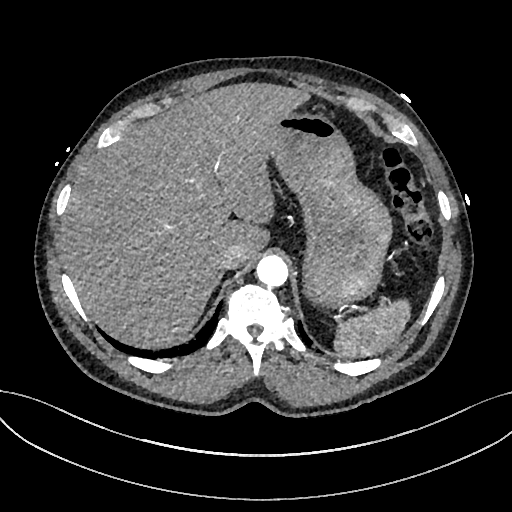
[im 226/307  soft-tissue]
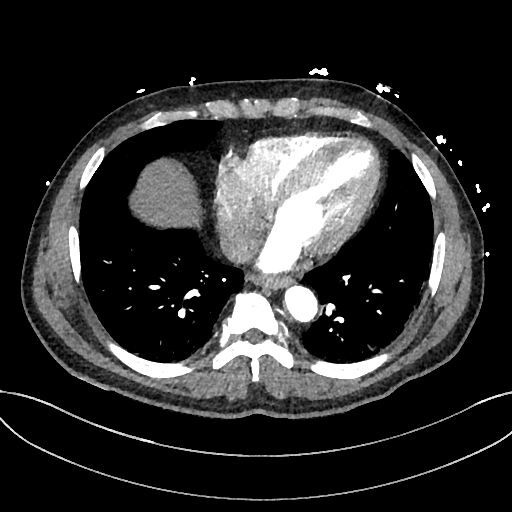
[im 258/307  soft-tissue]
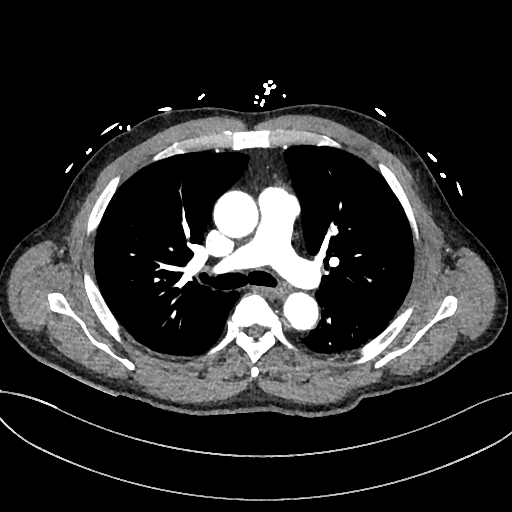
[im 258/307  bone]
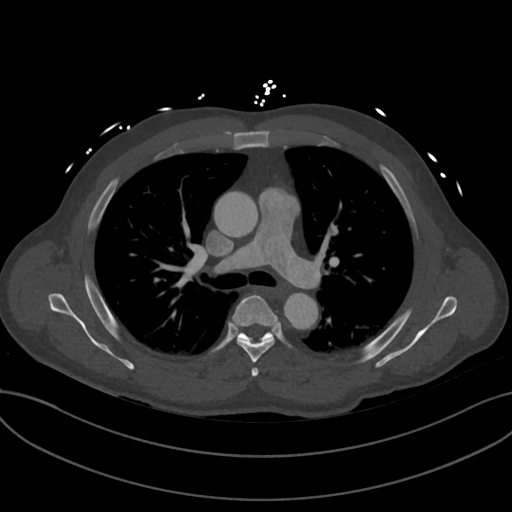
[im 290/307  soft-tissue]
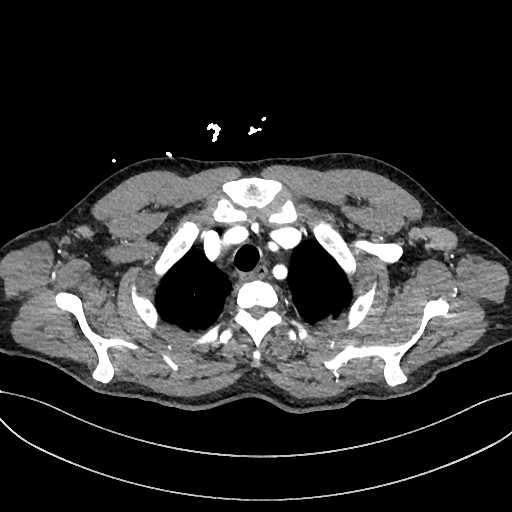

[Series 9: cor · coronal · 0.75mm/px · 3 of 152 slices shown]
[im 38/152  soft-tissue]
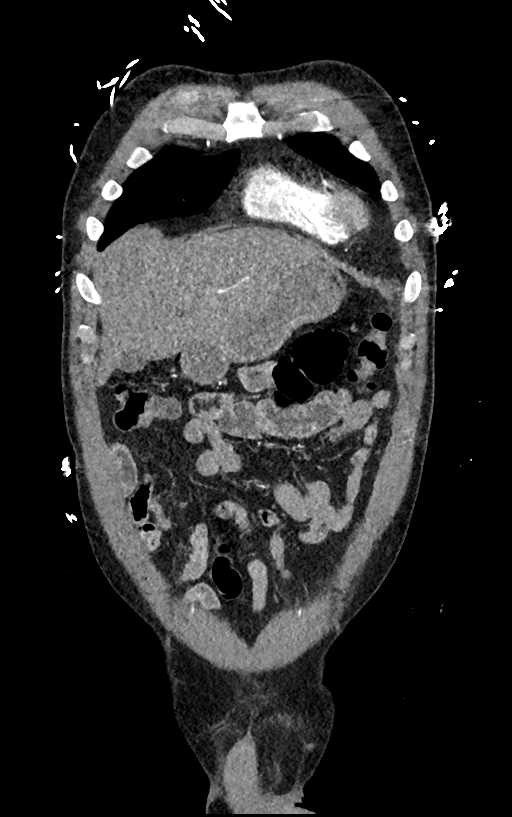
[im 76/152  soft-tissue]
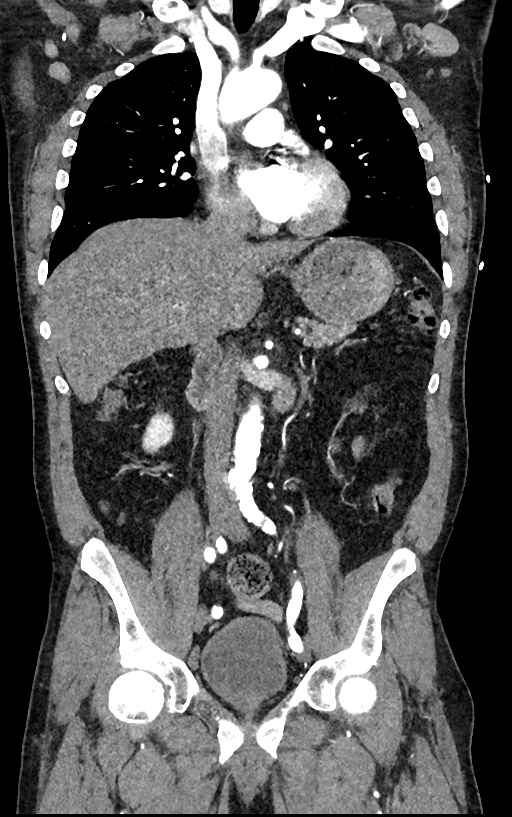
[im 114/152  soft-tissue]
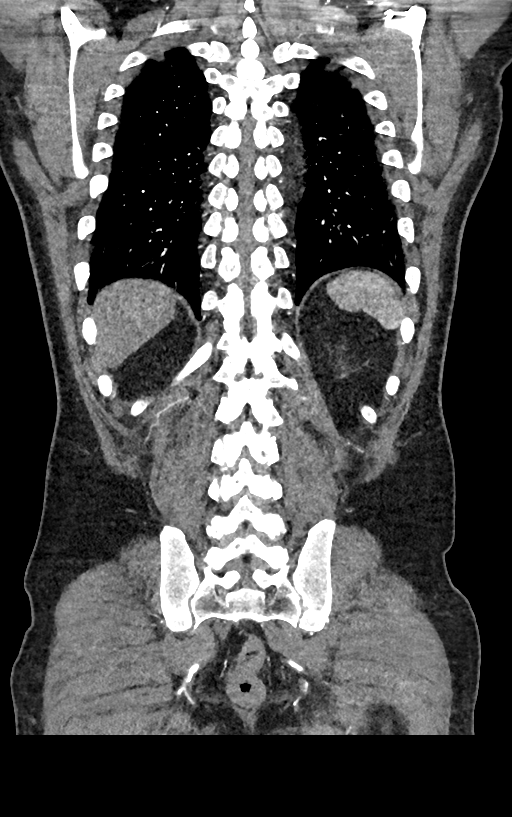

[13 of 46 positions shown; findings below may reference images not displayed]

FINDINGS: CTA CHEST FINDINGS

Cardiovascular: Preferential opacification of the thoracic aorta. No
evidence of thoracic aortic aneurysm or dissection. Normal heart
size. No pericardial effusion. Mild coronary artery calcific
atherosclerosis. Satisfactory opacification of the pulmonary
arteries. No pulmonary embolus identified.

Mediastinum/Nodes: No enlarged mediastinal or hilar lymph nodes.
Bilateral axillary lymphadenopathy is new from prior CT of chest
with the largest node on the left measuring up to 20 mm short axis
(series 6, image 17). Thyroid gland, trachea, and esophagus are
unremarkable.

Lungs/Pleura: Lungs are clear. No pleural effusion or pneumothorax.

Musculoskeletal: No chest wall abnormality. No acute or significant
osseous findings.

Review of the MIP images confirms the above findings.

CTA ABDOMEN AND PELVIS FINDINGS

VASCULAR

Aorta: Normal caliber aorta without aneurysm, dissection, vasculitis
or significant stenosis.

Celiac: Patent without evidence of aneurysm, dissection, vasculitis
or significant stenosis.

SMA: Patent without evidence of aneurysm, dissection, vasculitis or
significant stenosis.

Renals: Both renal arteries are patent without evidence of aneurysm,
dissection, vasculitis, fibromuscular dysplasia or significant
stenosis.

IMA: Patent without evidence of aneurysm, dissection, vasculitis or
significant stenosis.

Inflow: Patent without evidence of aneurysm, dissection, vasculitis
or significant stenosis.

Veins: No obvious venous abnormality within the limitations of this
arterial phase study.

Review of the MIP images confirms the above findings.

NON-VASCULAR

Hepatobiliary: Subcentimeter hyperdense foci are present within the
liver segments 3, 5, and 6 (series 6, image 119, 134, 142), likely
flash hemangioma. Otherwise no focal liver abnormality is seen. No
gallstones, gallbladder wall thickening, or biliary dilatation.

Pancreas: Unremarkable. No pancreatic ductal dilatation or
surrounding inflammatory changes.

Spleen: Normal in size without focal abnormality.

Adrenals/Urinary Tract: Adrenal glands are unremarkable. Kidneys are
normal, without renal calculi, focal lesion, or hydronephrosis.
Bladder is unremarkable.

Stomach/Bowel: Stomach is within normal limits. Appendix appears
normal. No evidence of bowel wall thickening, distention, or
inflammatory changes.

Lymphatic: Aortic and iliofemoral atherosclerosis. No enlarged
abdominal or pelvic lymph nodes.

Reproductive: Mild prostate enlargement.

Other: Left larger than right inguinal hernias containing fat.

Musculoskeletal: No fracture is seen.

Review of the MIP images confirms the above findings.
IMPRESSION: 1. No evidence of aortic aneurysm, dissection, vasculitis, or
significant stenosis. Aortic Atherosclerosis ([H4]-[H4]).
2. Mild coronary artery calcific atherosclerosis.
3. New nonspecific bilateral axillary lymphadenopathy. Findings are
probably reactive, however, can be seen with systemic infection,
lymphoproliferative disorder, autoimmune disease, or granulomatous
disease/sarcoidosis.
4. Left larger than right inguinal hernias containing fat.
5. Mild prostate enlargement.

## 2019-03-17 IMAGING — CR PORTABLE CHEST - 1 VIEW
1 series · 1 of 1 positions shown · non-contrast
Comparison: [DATE]

CLINICAL DATA: Left-sided chest pain

EXAM:
PORTABLE CHEST 1 VIEW

[AP]
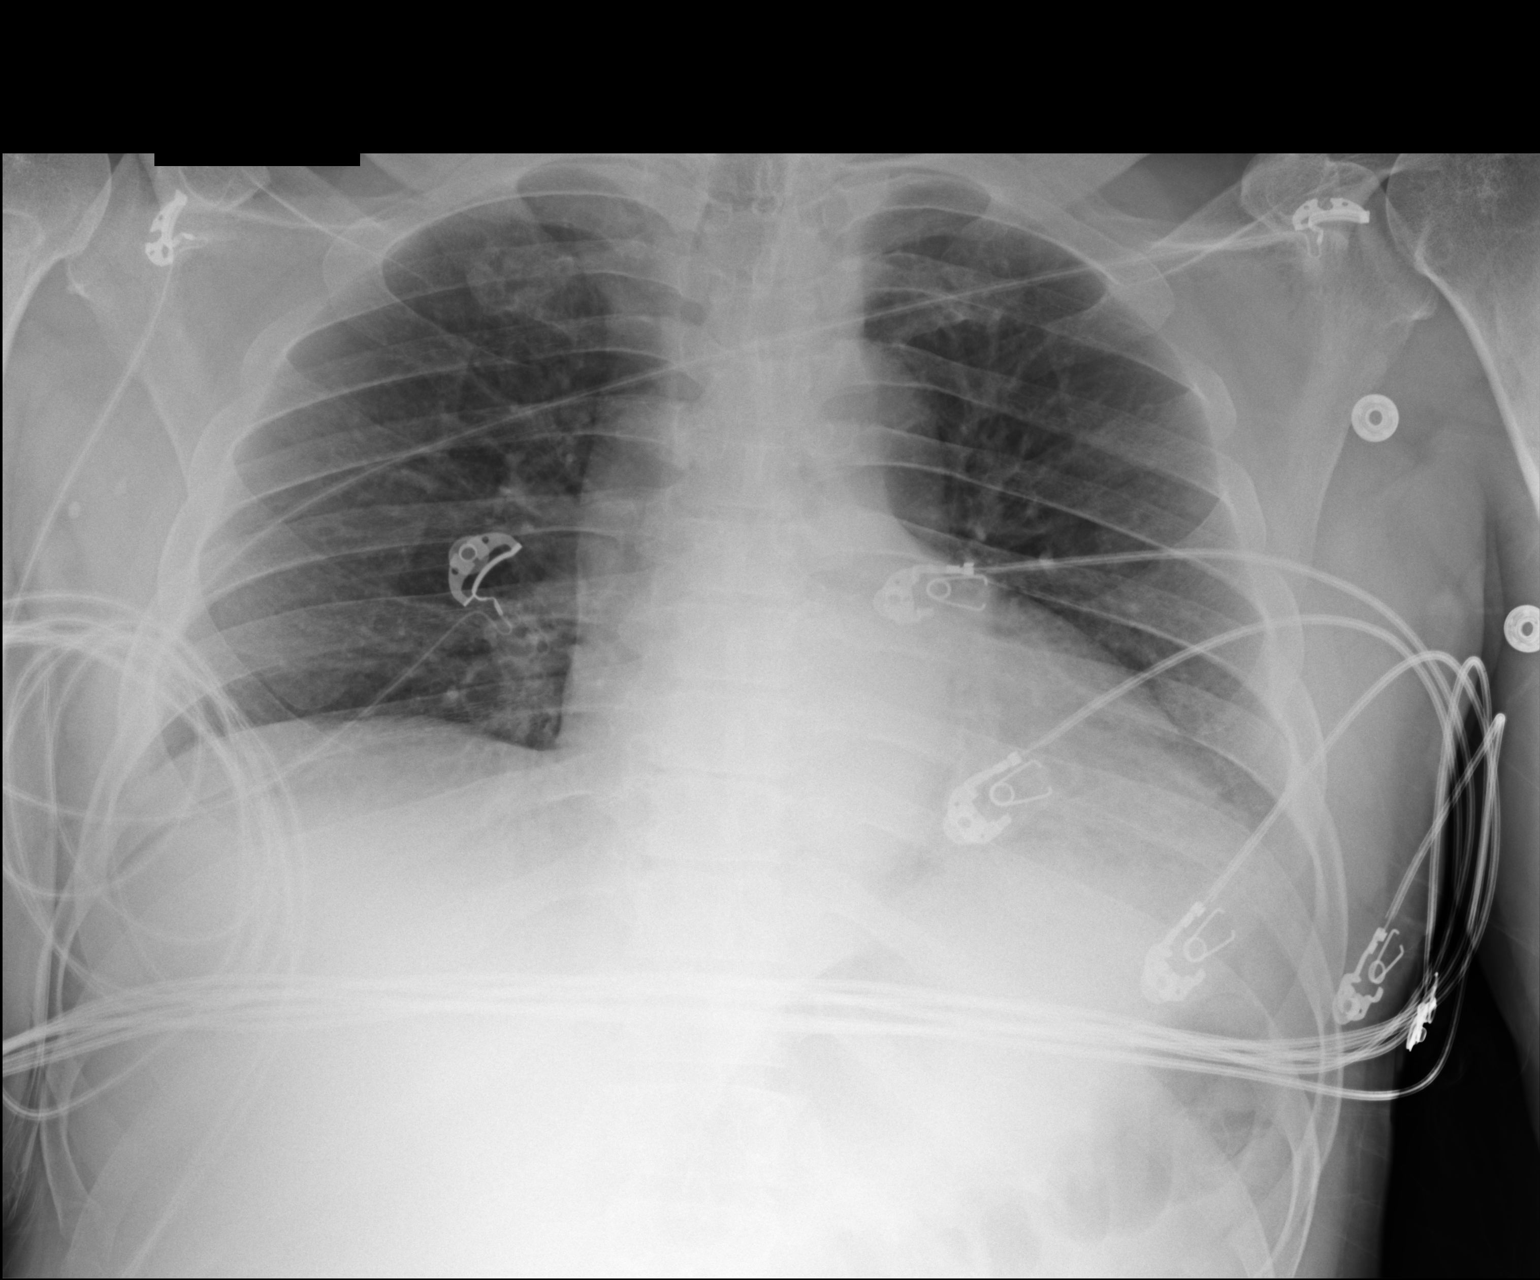

[1 of 1 positions shown; findings below may reference images not displayed]

FINDINGS: Cardiac shadows within normal limits. The lungs are well aerated
bilaterally. Mild left basilar atelectasis is noted. No other focal
abnormality is noted. No bony abnormality is seen.
IMPRESSION: Mild left basilar atelectasis.

## 2019-03-17 MED ORDER — ONDANSETRON HCL 4 MG/2ML IJ SOLN: 4.0000 mg | Freq: Four times a day (QID) | INTRAMUSCULAR | Status: DC | PRN

## 2019-03-17 MED ORDER — NITROGLYCERIN 0.4 MG SL SUBL: 0.4000 mg | SUBLINGUAL_TABLET | SUBLINGUAL | Status: DC | PRN

## 2019-03-17 MED ORDER — SODIUM CHLORIDE 0.9 % IV BOLUS
500.0000 mL | Freq: Once | INTRAVENOUS | Status: AC
  Administered 2019-03-17: 500 mL via INTRAVENOUS

## 2019-03-17 MED ORDER — INSULIN ASPART 100 UNIT/ML ~~LOC~~ SOLN
0.0000 [IU] | Freq: Three times a day (TID) | SUBCUTANEOUS | Status: DC
  Administered 2019-03-18: 09:00:00 2 [IU] via SUBCUTANEOUS

## 2019-03-17 MED ORDER — FOLIC ACID 1 MG PO TABS
1.0000 mg | ORAL_TABLET | Freq: Every day | ORAL | Status: DC
  Administered 2019-03-18: 1 mg via ORAL
  Filled 2019-03-17: qty 1

## 2019-03-17 MED ORDER — ALLOPURINOL 100 MG PO TABS
100.0000 mg | ORAL_TABLET | Freq: Every day | ORAL | Status: DC
  Administered 2019-03-18: 100 mg via ORAL
  Filled 2019-03-17: qty 1

## 2019-03-17 MED ORDER — NITROGLYCERIN 0.4 MG/HR TD PT24
0.4000 mg | MEDICATED_PATCH | Freq: Every day | TRANSDERMAL | Status: DC
  Administered 2019-03-18: 0.4 mg via TRANSDERMAL
  Filled 2019-03-17: qty 1

## 2019-03-17 MED ORDER — MORPHINE SULFATE (PF) 4 MG/ML IV SOLN
4.0000 mg | Freq: Once | INTRAVENOUS | Status: AC
  Administered 2019-03-17: 4 mg via INTRAVENOUS
  Filled 2019-03-17: qty 1

## 2019-03-17 MED ORDER — SODIUM CHLORIDE 0.9 % IV SOLN
INTRAVENOUS | Status: DC
  Administered 2019-03-17: 1 mL via INTRAVENOUS

## 2019-03-17 MED ORDER — ALBUTEROL SULFATE (2.5 MG/3ML) 0.083% IN NEBU: 3.0000 mL | INHALATION_SOLUTION | Freq: Four times a day (QID) | RESPIRATORY_TRACT | Status: DC | PRN

## 2019-03-17 MED ORDER — TIZANIDINE HCL 4 MG PO TABS
4.0000 mg | ORAL_TABLET | Freq: Three times a day (TID) | ORAL | Status: DC
  Administered 2019-03-17 – 2019-03-18 (×2): 4 mg via ORAL
  Filled 2019-03-17 (×4): qty 1

## 2019-03-17 MED ORDER — HEPARIN (PORCINE) 25000 UT/250ML-% IV SOLN
1350.0000 [IU]/h | INTRAVENOUS | Status: DC
  Administered 2019-03-17: 1000 [IU]/h via INTRAVENOUS
  Filled 2019-03-17: qty 250

## 2019-03-17 MED ORDER — ROSUVASTATIN CALCIUM 20 MG PO TABS
40.0000 mg | ORAL_TABLET | Freq: Every day | ORAL | Status: DC
  Administered 2019-03-18: 40 mg via ORAL
  Filled 2019-03-17: qty 2

## 2019-03-17 MED ORDER — LABETALOL HCL 5 MG/ML IV SOLN: 10.0000 mg | INTRAVENOUS | Status: DC | PRN

## 2019-03-17 MED ORDER — BUDESONIDE 0.25 MG/2ML IN SUSP
0.2500 mg | Freq: Two times a day (BID) | RESPIRATORY_TRACT | Status: DC | PRN
  Filled 2019-03-17: qty 2

## 2019-03-17 MED ORDER — VITAMIN B-1 100 MG PO TABS
100.0000 mg | ORAL_TABLET | Freq: Every day | ORAL | Status: DC
  Administered 2019-03-18: 100 mg via ORAL
  Filled 2019-03-17: qty 1

## 2019-03-17 MED ORDER — THIAMINE HCL 100 MG/ML IJ SOLN: 100.0000 mg | Freq: Every day | INTRAMUSCULAR | Status: DC

## 2019-03-17 MED ORDER — ADULT MULTIVITAMIN W/MINERALS CH
1.0000 | ORAL_TABLET | Freq: Every day | ORAL | Status: DC
  Administered 2019-03-18: 09:00:00 1 via ORAL
  Filled 2019-03-17: qty 1

## 2019-03-17 MED ORDER — LABETALOL HCL 200 MG PO TABS
200.0000 mg | ORAL_TABLET | Freq: Two times a day (BID) | ORAL | Status: DC
  Administered 2019-03-17 – 2019-03-18 (×2): 200 mg via ORAL
  Filled 2019-03-17 (×2): qty 1

## 2019-03-17 MED ORDER — ACETAMINOPHEN 325 MG PO TABS: 650.0000 mg | ORAL_TABLET | ORAL | Status: DC | PRN

## 2019-03-17 MED ORDER — ASPIRIN EC 81 MG PO TBEC
81.0000 mg | DELAYED_RELEASE_TABLET | Freq: Every day | ORAL | Status: DC
  Administered 2019-03-18: 09:00:00 81 mg via ORAL
  Filled 2019-03-17: qty 1

## 2019-03-17 MED ORDER — CLONIDINE HCL 0.1 MG PO TABS
0.1000 mg | ORAL_TABLET | Freq: Two times a day (BID) | ORAL | Status: DC
  Administered 2019-03-18: 0.1 mg via ORAL
  Filled 2019-03-17: qty 1

## 2019-03-17 MED ORDER — LORAZEPAM 2 MG/ML IJ SOLN: 1.0000 mg | Freq: Four times a day (QID) | INTRAMUSCULAR | Status: DC | PRN

## 2019-03-17 MED ORDER — OXYCODONE-ACETAMINOPHEN 5-325 MG PO TABS
1.0000 | ORAL_TABLET | Freq: Four times a day (QID) | ORAL | Status: DC | PRN
  Administered 2019-03-18: 03:00:00 2 via ORAL
  Filled 2019-03-17: qty 2

## 2019-03-17 MED ORDER — AMLODIPINE BESYLATE 10 MG PO TABS
10.0000 mg | ORAL_TABLET | Freq: Every day | ORAL | Status: DC
  Administered 2019-03-18: 10 mg via ORAL
  Filled 2019-03-17: qty 1

## 2019-03-17 MED ORDER — LORAZEPAM 1 MG PO TABS: 1.0000 mg | ORAL_TABLET | Freq: Four times a day (QID) | ORAL | Status: DC | PRN

## 2019-03-17 MED ORDER — ASPIRIN 300 MG RE SUPP: 300.0000 mg | RECTAL | Status: DC

## 2019-03-17 MED ORDER — HYDROCHLOROTHIAZIDE 25 MG PO TABS
25.0000 mg | ORAL_TABLET | Freq: Every day | ORAL | Status: DC
  Administered 2019-03-18: 25 mg via ORAL
  Filled 2019-03-17: qty 1

## 2019-03-17 MED ORDER — ASPIRIN 81 MG PO CHEW: 324.0000 mg | CHEWABLE_TABLET | ORAL | Status: DC

## 2019-03-17 MED ORDER — IOHEXOL 350 MG/ML SOLN
100.0000 mL | Freq: Once | INTRAVENOUS | Status: AC | PRN
  Administered 2019-03-17: 21:00:00 100 mL via INTRAVENOUS

## 2019-03-17 NOTE — ED Notes (Signed)
Cardiology at bedside.

## 2019-03-17 NOTE — ED Provider Notes (Signed)
Medical screening examination/treatment/procedure(s) were conducted as a shared visit with non-physician practitioner(s) and myself.  I personally evaluated the patient during the encounter.  EKG Interpretation  Date/Time:  Tuesday March 17 2019 18:51:46 EDT Ventricular Rate:  105 PR Interval:    QRS Duration: 84 QT Interval:  329 QTC Calculation: 435 R Axis:   57 Text Interpretation:  Sinus tachycardia Ventricular premature complex Borderline repolarization abnormality no change from previous Confirmed by Arby Barrette [phone]) on 03/17/2019 8:28:10 PM  Patient reports that he has had chest pain since yesterday evening.  He reports that it is more central and upper.  There have been sharp pains.  There is radiation toward the left arm.  Patient is also felt short of breath.  Symptoms have been happening persistently since yesterday evening.  Patient denies any fever, cough or lower extremity swelling.  Patient is alert and nontoxic.  Status clear.  Heart regular borderline tachycardia no gross rub murmur gallop.  Lungs good airflow, occasional expiratory wheeze, no gross rales.  No peripheral edema calf soft nontender.  Patient has elevated troponin at 0.5.  CT angiogram does not show evidence of dissection or large pulmonary embolus.  No significant consolidations or infiltrates to suggest probable infectious etiology.  At this time, high suspicion is for acute coronary syndrome.  She has multiple risk factors.  PA-C [place] has consulted Dr. Jacinto Halim who is the patient's managing cardiologist.  I agree with plan and management.  CRITICAL CARE Performed by: Arby Barrette   Total critical care time: 20 minutes  Critical care time was exclusive of separately billable procedures and treating other patients.  Critical care was necessary to treat or prevent imminent or life-threatening deterioration.  Critical care was time spent personally by me on the following activities: development  of treatment plan with patient and/or surrogate as well as nursing, discussions with consultants, evaluation of patient's response to treatment, examination of patient, obtaining history from patient or surrogate, ordering and performing treatments and interventions, ordering and review of laboratory studies, ordering and review of radiographic studies, pulse oximetry and re-evaluation of patient's condition.   Arby Barrette, MD 03/17/19 2153

## 2019-03-17 NOTE — ED Notes (Signed)
CBG 120 

## 2019-03-17 NOTE — ED Notes (Signed)
Lab called critical lab troponin 0.51 reported to EDP.

## 2019-03-17 NOTE — H&P (Addendum)
Darryl Hurley is an 58 y.o. male.   Chief Complaint: Chest pain HPI:   34 -year-old African-American male with hypertension, type II diabetes mellitus, tobacco and alcohol abuse, gout, midly reduced LVEF and mildly abnormal stress test (01/2018).  Patient presented to the ED today with intermittent left sided sharp chest pain, associated with shortness of breath since yesterday 03/16/2019. Pain was responsive no NTG today. He has chronic cough with no increase in sputum production. He denies any fever, chills, sick contacts. In the ED, D-dimer was elevated. Given radiation of chest poin to back, he underwent CTA looking for dissection. No dissection was seen. He is supposed to be on Xarelto for DVT, but has been out of it for many days.. Of note, D-dimer was also elevated in 2010 with CTA negative for PE.   He was tested for COVID 19 given cough, which is pending. While I was talking to the patient, he had sudden sharp left sided chest pain, lasting only for few seconds. He states that this pain is similar to what he has been having.   Past Medical History:  Diagnosis Date  . Alcohol abuse   . Angina pectoris   . Arthritis   . Asthma   . Chronic lower back pain   . COPD (chronic obstructive pulmonary disease) (HCC)    on home O2 2L  . Diabetes mellitus without complication (HCC)   . Exertional shortness of breath   . Gout   . Hypertension   . Seizures (HCC)     Past Surgical History:  Procedure Laterality Date  . NO PAST SURGERIES      Family History  Problem Relation Age of Onset  . Diabetes type II Mother   . Depression Father   . Suicidality Father   . Asthma Brother    Social History:  reports that he has been smoking cigarettes. He has a 20.00 pack-year smoking history. He has never used smokeless tobacco. He reports current alcohol use. He reports that he does not use drugs.  Allergies:  Allergies  Allergen Reactions  . Nsaids Other (See Comments)    Vagale down  reaction  . Toradol [Ketorolac Tromethamine]     vagale down reaction    Review of Systems  Constitution: Negative for decreased appetite, malaise/fatigue, weight gain and weight loss.  HENT: Negative for congestion.   Eyes: Negative for visual disturbance.  Cardiovascular: Positive for chest pain and dyspnea on exertion. Negative for leg swelling, palpitations and syncope.  Respiratory: Positive for cough and shortness of breath.   Endocrine: Negative for cold intolerance.  Hematologic/Lymphatic: Does not bruise/bleed easily.  Skin: Negative for itching and rash.  Musculoskeletal: Negative for myalgias.  Gastrointestinal: Negative for abdominal pain, nausea and vomiting.  Genitourinary: Negative for dysuria.  Neurological: Negative for dizziness and weakness.  Psychiatric/Behavioral: The patient is not nervous/anxious.   All other systems reviewed and are negative.    Blood pressure 126/80, pulse 94, temperature 98.6 F (37 C), temperature source Oral, resp. rate (!) 24, height  (1.651 m), weight 90.7 kg, SpO2 98 %. Body mass index is 33.28 kg/m.  Physical Exam  Constitutional: He is oriented to person, place, and time. He appears well-developed and well-nourished. No distress.  HENT:  Head: Normocephalic and atraumatic.  Eyes: Pupils are equal, round, and reactive to light. Conjunctivae are normal.  Neck: No JVD present.  Cardiovascular: Normal rate, regular rhythm and intact distal pulses.  No murmur heard. Pulmonary/Chest: Effort normal and  breath sounds normal. He has no wheezes. He has no rales.  Abdominal: Soft. Bowel sounds are normal. There is no rebound.  Musculoskeletal:        General: No edema.  Lymphadenopathy:    He has no cervical adenopathy.  Neurological: He is alert and oriented to person, place, and time. No cranial nerve deficit.  Skin: Skin is warm and dry.  Psychiatric: He has a normal mood and affect.  Nursing note and vitals  reviewed.   Results for orders placed or performed during the hospital encounter of 03/17/19 (from the past 48 hour(s))  Troponin I - Once     Status: Abnormal   Collection Time: 03/17/19  6:42 PM  Result Value Ref Range   Troponin I 0.51 (HH) <0.03 ng/mL    Comment: CRITICAL RESULT CALLED TO, READ BACK BY AND VERIFIED WITH: Teressa Senter RN AT 1955 ON [id number] BY Marveen Reeks Performed at [facility], [address], [place], [place] [address]   CBC with Differential     Status: Abnormal   Collection Time: 03/17/19  6:42 PM  Result Value Ref Range   WBC 9.7 4.0 - 10.5 K/uL   RBC 4.96 4.22 - 5.81 MIL/uL   Hemoglobin 14.4 13.0 - 17.0 g/dL   HCT 11.9 14.7 - 82.9 %   MCV 89.7 80.0 - 100.0 fL   MCH 29.0 26.0 - 34.0 pg   MCHC 32.4 30.0 - 36.0 g/dL   RDW 56.2 (H) 13.0 - 86.5 %   Platelets 394 150 - 400 K/uL   nRBC 0.0 0.0 - 0.2 %   Neutrophils Relative % 77 %   Neutro Abs 7.3 1.7 - 7.7 K/uL   Lymphocytes Relative 15 %   Lymphs Abs 1.5 0.7 - 4.0 K/uL   Monocytes Relative 8 %   Monocytes Absolute 0.8 0.1 - 1.0 K/uL   Eosinophils Relative 0 %   Eosinophils Absolute 0.0 0.0 - 0.5 K/uL   Basophils Relative 0 %   Basophils Absolute 0.0 0.0 - 0.1 K/uL   Immature Granulocytes 0 %   Abs Immature Granulocytes 0.03 0.00 - 0.07 K/uL    Comment: Performed at [facility], [address] [place], [place] [address]  D-dimer, quantitative (not at [facility])     Status: Abnormal   Collection Time: 03/17/19  6:42 PM  Result Value Ref Range   D-Dimer, Quant 2.00 (H) 0.00 - 0.50 ug/mL-FEU    Comment: (NOTE) At the manufacturer cut-off of 0.50 ug/mL FEU, this assay has been documented to exclude PE with a sensitivity and negative predictive value of 97 to 99%.  At this time, this assay has not been approved by the FDA to exclude DVT/VTE. Results should be correlated with clinical presentation. Performed at [facility], [address] [place], [place] [address]   I-Stat Creatinine,  ED (not at [facility])     Status: Abnormal   Collection Time: 03/17/19  8:29 PM  Result Value Ref Range   Creatinine, Ser 1.40 (H) 0.61 - 1.24 mg/dL  Basic metabolic panel     Status: Abnormal   Collection Time: 03/17/19  8:32 PM  Result Value Ref Range   Sodium 136 135 - 145 mmol/L   Potassium 3.6 3.5 - 5.1 mmol/L   Chloride 102 98 - 111 mmol/L   CO2 22 22 - 32 mmol/L   Glucose, Bld 161 (H) 70 - 99 mg/dL   BUN 16 6 - 20 mg/dL  Creatinine, Ser 1.45 (H) 0.61 - 1.24 mg/dL   Calcium 9.5 8.9 - 16.110.3 mg/dL   GFR calc non Af Amer 53 (L) >60 mL/min   GFR calc Af Amer >60 >60 mL/min   Anion gap 12 5 - 15    Comment: Performed at [facility], [address] [place], [place] [id number]    Labs:   Lab Results  Component Value Date   WBC 9.7 03/17/2019   HGB 14.4 03/17/2019   HCT 44.5 03/17/2019   MCV 89.7 03/17/2019   PLT 394 03/17/2019    Recent Labs  Lab 03/17/19 2032  NA 136  K 3.6  CL 102  CO2 22  BUN 16  CREATININE 1.45*  CALCIUM 9.5  GLUCOSE 161*    Lipid Panel  No results found for: CHOL, TRIG, HDL, CHOLHDL, VLDL, LDLCALC  BNP (last 3 results) Recent Labs    11/24/18 1448  BNP 29.6    HEMOGLOBIN A1C Lab Results  Component Value Date   HGBA1C 8.1 (H) 11/20/2013   MPG 186 (H) 11/20/2013    Cardiac Panel (last 3 results) Recent Labs    11/24/18 1449 11/24/18 1827 03/17/19 1842  TROPONINI <0.03 <0.03 0.51*    Lab Results  Component Value Date   CKTOTAL 406 (H) 07/21/2012   CKMB 2.0 07/21/2012   TROPONINI 0.51 (HH) 03/17/2019     TSH No results for input(s): TSH in the last 8760 hours.   (Not in a hospital admission)     Current Facility-Administered Medications:  .  0.9 %  sodium chloride infusion, , Intravenous, Continuous, ,  J, MD .  acetaminophen (TYLENOL) tablet 650 mg, 650 mg, Oral, Q4H PRN, ,  J, MD .  albuterol (PROVENTIL HFA;VENTOLIN HFA) 108 (90 Base) MCG/ACT inhaler 1 puff, 1 puff, Inhalation,  Q6H PRN, ,  J, MD .  Melene Muller[START ON 03/18/2019] allopurinol (ZYLOPRIM) tablet 100 mg, 100 mg, Oral, Daily, ,  J, MD .  Melene Muller[START ON 03/18/2019] amLODipine (NORVASC) tablet 10 mg, 10 mg, Oral, Daily, ,  J, MD .  aspirin chewable tablet 324 mg, 324 mg, Oral, NOW **OR** aspirin suppository 300 mg, 300 mg, Rectal, NOW, ,  J, MD .  Melene Muller[START ON 03/18/2019] aspirin EC tablet 81 mg, 81 mg, Oral, Daily, ,  J, MD .  Melene Muller[START ON 03/18/2019] cloNIDine (CATAPRES) tablet 0.1 mg, 0.1 mg, Oral, BID, ,  J, MD .  fluticasone (FLOVENT HFA) 110 MCG/ACT inhaler 1 puff, 1 puff, Inhalation, BID PRN, , Anabel Bene J, MD .  Melene Muller[START ON 03/18/2019] folic acid (FOLVITE) tablet 1 mg, 1 mg, Oral, Daily, ,  J, MD .  Melene Muller[START ON 03/18/2019] hydrochlorothiazide (HYDRODIURIL) tablet 25 mg, 25 mg, Oral, Daily, ,  J, MD .  labetalol (NORMODYNE) tablet 200 mg, 200 mg, Oral, BID, ,  J, MD .  LORazepam (ATIVAN) tablet 1 mg, 1 mg, Oral, Q6H PRN **OR** LORazepam (ATIVAN) injection 1 mg, 1 mg, Intravenous, Q6H PRN, , Anabel Bene J, MD .  Melene Muller[START ON 03/18/2019] multivitamin with minerals tablet 1 tablet, 1 tablet, Oral, Daily, ,  J, MD .  nitroGLYCERIN (NITROSTAT) SL tablet 0.4 mg, 0.4 mg, Sublingual, Q5 min PRN, Morelli, Brandon A, PA-C .  nitroGLYCERIN (NITROSTAT) SL tablet 0.4 mg, 0.4 mg, Sublingual, Q5 Min x 3 PRN, ,  J, MD .  ondansetron (ZOFRAN) injection 4 mg, 4 mg, Intravenous, Q6H PRN, ,  J, MD .  oxyCODONE-acetaminophen (PERCOCET/ROXICET) 5-325 MG per tablet 1-2 tablet, 1-2 tablet, Oral, Q6H  PRN, Elder Negus, MD .  Melene Muller ON 03/18/2019] rosuvastatin (CRESTOR) tablet 40 mg, 40 mg, Oral, Daily, ,  J, MD .  Melene Muller ON 03/18/2019] thiamine (VITAMIN B-1) tablet 100 mg, 100 mg, Oral, Daily **OR** [START ON 03/18/2019] thiamine (B-1)  injection 100 mg, 100 mg, Intravenous, Daily, ,  J, MD .  tiZANidine (ZANAFLEX) capsule 4 mg, 4 mg, Oral, TID, ,  J, MD  Current Outpatient Medications:  .  albuterol (PROVENTIL HFA;VENTOLIN HFA) 108 (90 BASE) MCG/ACT inhaler, Inhale 1 puff into the lungs every 6 (six) hours as needed for wheezing or shortness of breath., Disp: , Rfl:  .  allopurinol (ZYLOPRIM) 100 MG tablet, Take 100 mg by mouth daily., Disp: , Rfl:  .  amLODipine (NORVASC) 10 MG tablet, Take 10 mg by mouth daily., Disp: , Rfl: 2 .  cloNIDine (CATAPRES) 0.1 MG tablet, Take 0.1 mg by mouth 2 (two) times daily., Disp: , Rfl: 3 .  doxycycline (VIBRAMYCIN) 100 MG capsule, Take 100 mg by mouth 2 (two) times daily., Disp: , Rfl:  .  FLOVENT HFA 110 MCG/ACT inhaler, Inhale 1 puff into the lungs 2 (two) times daily as needed (shortness of breath). , Disp: , Rfl: 3 .  hydrochlorothiazide (HYDRODIURIL) 25 MG tablet, Take 25 mg by mouth daily. , Disp: , Rfl: 2 .  labetalol (NORMODYNE) 200 MG tablet, Take 1 tablet (200 mg total) by mouth 2 (two) times daily., Disp: 60 tablet, Rfl: 0 .  losartan (COZAAR) 100 MG tablet, Take 1 tablet (100 mg total) by mouth daily., Disp: 30 tablet, Rfl: 0 .  metFORMIN (GLUCOPHAGE) 1000 MG tablet, Take 1 tablet (1,000 mg total) by mouth 2 (two) times daily with a meal., Disp: 60 tablet, Rfl: 0 .  oxyCODONE-acetaminophen (PERCOCET/ROXICET) 5-325 MG tablet, Take 1-2 tablets by mouth every 6 (six) hours as needed for severe pain., Disp: 6 tablet, Rfl: 0 .  Rivaroxaban 15 & 20 MG TBPK, Take as directed on package: Start with one  tablet by mouth twice a day with food. On Day 22, switch to one  tablet once a day with food. (Patient taking differently: Take 20 mg by mouth daily. ), Disp: 51 each, Rfl: 0 .  rosuvastatin (CRESTOR) 40 MG tablet, Take 40 mg by mouth daily., Disp: , Rfl:  .  tiZANidine (ZANAFLEX) 4 MG capsule, Take 4 mg by mouth as needed for muscle spasms. , Disp: ,  Rfl: 1   Today's Vitals   03/17/19 2030 03/17/19 2103 03/17/19 2145 03/17/19 2200  BP: (!) 131/101   126/80  Pulse: 100  89 94  Resp: 20  (!) 23 (!) 24  Temp:      TempSrc:      SpO2: 95%  97% 98%  Weight:      Height:      PainSc:  6      Body mass index is 33.28 kg/m.   CARDIAC STUDIES:  EKG 03/17/2019: Sinus tachycardia. Nonspecific ST-T changes  Lexiscan myoview stress test 01/31/2018:  1. Pharmacologic stress testing was performed with intravenous administration of .4 mg of Lexiscan over a 10-15 seconds infusion. Patient was hypertensive throughout the study, peak BP 200/120 mmHg. Stress symptoms included dyspnea, dizziness.  2. Exercise capacity not assessed. Stress EKG is non diagnostic for ischemia as it is a pharmacologic stress.  3. The overall quality of the study is good. Left ventricular cavity is noted to be normal on the rest and stress studies. Gated SPECT images reveal normal  myocardial thickening and wall motion. The left ventricular ejection fraction was calculated or visually estimated to be 41%. SPECT images demonstrate small perfusion abnormality of mild intensity in the mid inferolateral myocardial wall(s) on the stress images with mild reversibility on rest images.  4. Low to intermediate risk study.   Echocardiogram 02/11/2018:  Left ventricle cavity is normal in size. Mild concentric hypertrophy of the left ventricle. Mild decrease in global wall motion. Visual EF is 45-50%. Doppler evidence of grade I (impaired) diastolic dysfunction, normal LAP. Calculated EF 45%.  Left atrial cavity is mildly dilated.  Inadequate tricuspid regurgitation jet to estimate pulmonary artery pressure  IVC is dilated with blunted respiratory response. Estimated RA pressure 10-15 mmHg.   Assessment/Plan  65 -year-old African-American male with hypertension, type II diabetes mellitus, tobacco and alcohol abuse, gout, midly reduced LVEF and mildly abnormal stress test  (01/2018), admitted with chest pain.  Chest pain: Atypical, with short sharp bursts of chest pain. However, his troponin is elevated. He has multiple CAD risk factors. He has previous mildly abnormal stress test. Coronary angiography and possible intervention is warranted. Given 1200 cc contrast load for CTA today, I would like to defer this at least till 04/15 afternoon and potentially expedite his discharge on 04/15, provided no surgical revascularization needs or any complications. Also, holding Xarelto. He has not been compliant with it, and is beyond 6 mnth since his DVT diagnosis in 09/2018. Recommend heparin ACS protocol, Aspirin, crestor. BP control. Hold P2Y12i till coronary angiography. Echocardiogram tomorrow.  Hypertension: Controlled. Hold losartan, anticipating cath tomorrow.  Cough: Chronic. COVI-19 test negative., No infiltrate on CT chest  Elevated d-Dimer: H/o DVT could explain this even without PE. CTA was not specific for PE. Prior elevated diDimer to higher level in 2010 with no PE found. Clinical suspicion for PE is low. I do not think he warrants another contrast load for PE study at this time. Will obtain LE [place] and echocardiogram.   Type 2 DM: Hold metformin  Alcohol abuse: CIWA protocol  Tobacco abuse: Cessation counseling.    Elder Negus, MD 03/17/2019, 10:21 PM [facility] Pager: [phone] Office: [phone] If no answer: [phone]

## 2019-03-17 NOTE — ED Provider Notes (Addendum)
[facility]   CSN: [id number] Arrival date & time: 03/17/19  1831    History   Chief Complaint Chief Complaint  Patient presents with  . Chest Pain    HPI Darryl Hurley is a 58 y.o. male with history of COPD as needed home O2 use, diabetes, hypertension, EtOH abuse presented today for chest pain shortness of breath.  Patient reports that last night he developed a sharp central chest pain that is been constant since onset radiates to his left arm worsened with motion and exertion and without alleviating factors.  Patient called EMS today and received 324 mg aspirin in route.  Additionally patient reports shortness of breath, pleuritic pain with deep breaths which is new for him beginning yesterday.  Additionally patient reports chronic nonproductive cough without recent change in sputum production.  Finally patient reports posterior headache severe constant denies numbness weakness or tingling, denies vision changes.  Denies fever/chills, abdominal pain, nausea/vomiting or any additional concerns.     HPI  Past Medical History:  Diagnosis Date  . Alcohol abuse   . Angina pectoris   . Arthritis   . Asthma   . Chronic lower back pain   . COPD (chronic obstructive pulmonary disease) (HCC)    on home O2 2L  . Diabetes mellitus without complication (HCC)   . Exertional shortness of breath   . Gout   . Hypertension   . Seizures [facility](HCC)     Patient Active Problem List   Diagnosis Date Noted  . Lactic acidosis 01/06/2016  . Lactic acid acidosis 01/06/2016  . Alcohol abuse with intoxication (HCC) 01/06/2016  . COPD with acute exacerbation (HCC) 02/02/2014  . HCAP (healthcare-associated pneumonia) 02/02/2014  . Increased anion gap metabolic acidosis 02/02/2014  . Cocaine abuse (HCC) 11/18/2013  . Fever 11/17/2013  . Anemia 11/17/2013  . Hypokalemia 11/17/2013  . Polyarthritis 11/17/2013  . Dehydration 11/17/2013  . Type  2 diabetes mellitus (HCC) 05/14/2013  . Chronic obstructive pulmonary disease (COPD) (HCC) 05/12/2013  . Gout 05/12/2013  . Headache 05/12/2013  . Asthma 05/04/2013  . TOBACCO ABUSE 04/27/2009  . Uncontrolled hypertension 04/27/2009    Past Surgical History:  Procedure Laterality Date  . NO PAST SURGERIES          Home Medications    Prior to Admission medications   Medication Sig Start Date End Date Taking? Authorizing Provider  albuterol (PROVENTIL HFA;VENTOLIN HFA) 108 (90 BASE) MCG/ACT inhaler Inhale 1 puff into the lungs every 6 (six) hours as needed for wheezing or shortness of breath.    [provider]  allopurinol (ZYLOPRIM) 100 MG tablet Take 100 mg by mouth daily.    [provider]  amLODipine (NORVASC) 10 MG tablet Take 10 mg by mouth daily. 08/06/18   [provider]  cloNIDine (CATAPRES) 0.1 MG tablet Take 0.1 mg by mouth 2 (two) times daily. 08/15/18   [provider]  doxycycline (VIBRAMYCIN) 100 MG capsule Take 100 mg by mouth 2 (two) times daily.    [provider]  FLOVENT HFA 110 MCG/ACT inhaler Inhale 1 puff into the lungs 2 (two) times daily as needed (shortness of breath).  08/06/18   [provider]  hydrochlorothiazide (HYDRODIURIL) 25 MG tablet Take 25 mg by mouth daily.  07/14/18   [provider]  labetalol (NORMODYNE) 200 MG tablet Take 1 tablet (200 mg total) by mouth 2 (two) times daily. 02/16/19   Patwardhan, Anabel BeneManish J, MD  losartan (COZAAR) 100 MG tablet Take 1 tablet (100 mg total) by mouth daily. 12/24/17   Street, [place], PA-C  metFORMIN (GLUCOPHAGE) 1000 MG tablet Take 1 tablet (1,000 mg total) by mouth 2 (two) times daily with a meal. 01/08/16   Alison Murray, MD  oxyCODONE-acetaminophen (PERCOCET/ROXICET) 5-325 MG tablet Take 1-2 tablets by mouth every 6 (six) hours as needed for severe pain. 11/24/18   Petrucelli, Pleas Koch, PA-C  Rivaroxaban 15 & 20 MG TBPK Take as directed on package:  Start with one  tablet by mouth twice a day with food. On Day 22, switch to one  tablet once a day with food. Patient taking differently: Take 20 mg by mouth daily.  09/05/18   Renne Crigler, PA-C  rosuvastatin (CRESTOR) 40 MG tablet Take 40 mg by mouth daily.    [provider]  tiZANidine (ZANAFLEX) 4 MG capsule Take 4 mg by mouth as needed for muscle spasms.  06/25/18   [provider]    Family History Family History  Problem Relation Age of Onset  . Diabetes type II Mother   . Depression Father   . Suicidality Father   . Asthma Brother     Social History Social History   Tobacco Use  . Smoking status: Current Every Day Smoker    Packs/day: 0.50    Years: 40.00    Pack years: 20.00    Types: Cigarettes  . Smokeless tobacco: Never Used  . Tobacco comment: 05/12/2013 'eased off smoking since last month"  Substance Use Topics  . Alcohol use: Yes    Comment: 4 40 oz beers per day, 1 pint of liqor in a day  . Drug use: No    Types: "Crack" cocaine     Allergies   Nsaids and Toradol [ketorolac tromethamine]   Review of Systems Review of Systems  Constitutional: Negative.  Negative for chills and fever.  Eyes: Negative.  Negative for visual disturbance.  Respiratory: Positive for cough and shortness of breath.   Cardiovascular: Positive for chest pain.  Gastrointestinal: Negative for abdominal pain, diarrhea, nausea and vomiting.  Musculoskeletal: Negative.  Negative for arthralgias and myalgias.  Neurological: Positive for headaches. Negative for weakness.  All other systems reviewed and are negative.  Physical Exam Updated Vital Signs BP (!) 131/101   Pulse 100   Temp 98.6 F (37 C) (Oral)   Resp 20   Ht  (1.651 m)   Wt 90.7 kg   SpO2 95%   BMI 33.28 kg/m   Physical Exam Constitutional:      General: He is not in acute distress.    Appearance: Normal appearance. He is well-developed. He is not ill-appearing or diaphoretic.   HENT:     Head: Normocephalic and atraumatic.     Right Ear: External ear normal.     Left Ear: External ear normal.     Nose: Nose normal.  Eyes:     General: Vision grossly intact. Gaze aligned appropriately.     Pupils: Pupils are equal, round, and reactive to light.  Neck:     Musculoskeletal: Normal range of motion.     Trachea: Trachea and phonation normal. No tracheal deviation.  Cardiovascular:     Rate and Rhythm: Regular rhythm. Tachycardia present.     Pulses:          Dorsalis pedis pulses are 2+ on the right side and 2+ on the left side.       Posterior tibial  pulses are 2+ on the right side and 2+ on the left side.     Heart sounds: Normal heart sounds.  Pulmonary:     Effort: Pulmonary effort is normal. Tachypnea present. No accessory muscle usage or respiratory distress.     Breath sounds: Wheezing present.  Chest:     Chest wall: Tenderness present. No deformity or crepitus.  Abdominal:     General: There is no distension.     Palpations: Abdomen is soft.     Tenderness: There is no abdominal tenderness. There is no guarding or rebound.  Musculoskeletal: Normal range of motion.     Right lower leg: He exhibits no tenderness. No edema.     Left lower leg: He exhibits no tenderness. No edema.  Skin:    General: Skin is warm and dry.  Neurological:     Mental Status: He is alert.     GCS: GCS eye subscore is 4. GCS verbal subscore is 5. GCS motor subscore is 6.     Comments: Speech is clear and goal oriented, follows commands Major Cranial nerves without deficit, no facial droop Normal strength in upper and lower extremities bilaterally including dorsiflexion and plantar flexion, strong and equal grip strength Sensation normal to light touch Moves extremities without ataxia, coordination intact  Psychiatric:        Behavior: Behavior normal.    ED Treatments / Results  Labs (all labs ordered are listed, but only abnormal results are displayed) Labs  Reviewed  TROPONIN I - Abnormal; Notable for the following components:      Result Value   Troponin I 0.51 (*)    All other components within normal limits  CBC WITH DIFFERENTIAL/PLATELET - Abnormal; Notable for the following components:   RDW 15.6 (*)    All other components within normal limits  D-DIMER, QUANTITATIVE (NOT AT [facility]) - Abnormal; Notable for the following components:   D-Dimer, Quant 2.00 (*)    All other components within normal limits  BASIC METABOLIC PANEL - Abnormal; Notable for the following components:   Glucose, Bld 161 (*)    Creatinine, Ser 1.45 (*)    GFR calc non Af Amer 53 (*)    All other components within normal limits  I-STAT CREATININE, ED - Abnormal; Notable for the following components:   Creatinine, Ser 1.40 (*)    All other components within normal limits  SARS CORONAVIRUS 2 (HOSPITAL ORDER, PERFORMED IN [facility] LAB)    EKG EKG Interpretation  Date/Time:  Tuesday March 17 2019 18:51:46 EDT Ventricular Rate:  105 PR Interval:    QRS Duration: 84 QT Interval:  329 QTC Calculation: 435 R Axis:   57 Text Interpretation:  Sinus tachycardia Ventricular premature complex Borderline repolarization abnormality no change from previous Confirmed by Arby Barrette [phone]) on 03/17/2019 8:28:10 PM   Radiology Dg Chest Port 1 View  Result Date: 03/17/2019 CLINICAL DATA:  Left-sided chest pain EXAM: PORTABLE CHEST 1 VIEW COMPARISON:  11/24/2018 FINDINGS: Cardiac shadows within normal limits. The lungs are well aerated bilaterally. Mild left basilar atelectasis is noted. No other focal abnormality is noted. No bony abnormality is seen. IMPRESSION: Mild left basilar atelectasis. Electronically Signed   By: Alcide Clever M.D.   On: 03/17/2019 20:10   Ct Angio Chest/abd/pel For Dissection W And/or Wo Contrast  Result Date: 03/17/2019 CLINICAL DATA:  58 y/o M; left-sided chest pain radiating to the left arm. EXAM: CT ANGIOGRAPHY CHEST, ABDOMEN AND  PELVIS TECHNIQUE: Multidetector  CT imaging through the chest, abdomen and pelvis was performed using the standard protocol during bolus administration of intravenous contrast. Multiplanar reconstructed images and MIPs were obtained and reviewed to evaluate the vascular anatomy. CONTRAST:  OMNIPAQUE IOHEXOL 350 MG/ML SOLN COMPARISON:  10/22/2018 CT chest. FINDINGS: CTA CHEST FINDINGS Cardiovascular: Preferential opacification of the thoracic aorta. No evidence of thoracic aortic aneurysm or dissection. Normal heart size. No pericardial effusion. Mild coronary artery calcific atherosclerosis. Satisfactory opacification of the pulmonary arteries. No pulmonary embolus identified. Mediastinum/Nodes: No enlarged mediastinal or hilar lymph nodes. Bilateral axillary lymphadenopathy is new from prior CT of chest with the largest node on the left measuring up to 20 mm short axis (series 6, image 17). Thyroid gland, trachea, and esophagus are unremarkable. Lungs/Pleura: Lungs are clear. No pleural effusion or pneumothorax. Musculoskeletal: No chest wall abnormality. No acute or significant osseous findings. Review of the MIP images confirms the above findings. CTA ABDOMEN AND PELVIS FINDINGS VASCULAR Aorta: Normal caliber aorta without aneurysm, dissection, vasculitis or significant stenosis. Celiac: Patent without evidence of aneurysm, dissection, vasculitis or significant stenosis. SMA: Patent without evidence of aneurysm, dissection, vasculitis or significant stenosis. Renals: Both renal arteries are patent without evidence of aneurysm, dissection, vasculitis, fibromuscular dysplasia or significant stenosis. IMA: Patent without evidence of aneurysm, dissection, vasculitis or significant stenosis. Inflow: Patent without evidence of aneurysm, dissection, vasculitis or significant stenosis. Veins: No obvious venous abnormality within the limitations of this arterial phase study. Review of the MIP images confirms the  above findings. NON-VASCULAR Hepatobiliary: Subcentimeter hyperdense foci are present within the liver segments 3, 5, and 6 (series 6, image 119, 134, 142), likely flash hemangioma. Otherwise no focal liver abnormality is seen. No gallstones, gallbladder wall thickening, or biliary dilatation. Pancreas: Unremarkable. No pancreatic ductal dilatation or surrounding inflammatory changes. Spleen: Normal in size without focal abnormality. Adrenals/Urinary Tract: Adrenal glands are unremarkable. Kidneys are normal, without renal calculi, focal lesion, or hydronephrosis. Bladder is unremarkable. Stomach/Bowel: Stomach is within normal limits. Appendix appears normal. No evidence of bowel wall thickening, distention, or inflammatory changes. Lymphatic: Aortic and iliofemoral atherosclerosis. No enlarged abdominal or pelvic lymph nodes. Reproductive: Mild prostate enlargement. Other: Left larger than right inguinal hernias containing fat. Musculoskeletal: No fracture is seen. Review of the MIP images confirms the above findings. IMPRESSION: 1. No evidence of aortic aneurysm, dissection, vasculitis, or significant stenosis. Aortic Atherosclerosis (ICD10-I70.0). 2. Mild coronary artery calcific atherosclerosis. 3. New nonspecific bilateral axillary lymphadenopathy. Findings are probably reactive, however, can be seen with systemic infection, lymphoproliferative disorder, autoimmune disease, or granulomatous disease/sarcoidosis. 4. Left larger than right inguinal hernias containing fat. 5. Mild prostate enlargement. Electronically Signed   By: Mitzi Hansen M.D.   On: 03/17/2019 21:45    Procedures .Critical Care Performed by: Bill Salinas, PA-C Authorized by: Bill Salinas, PA-C   Critical care provider statement:    Critical care time (minutes):  35   Critical care was necessary to treat or prevent imminent or life-threatening deterioration of the following conditions:  Cardiac failure (ACS;  elevated troponin)   Critical care was time spent personally by me on the following activities:  Ordering and performing treatments and interventions, ordering and review of laboratory studies, ordering and review of radiographic studies, pulse oximetry, re-evaluation of patient's condition, review of old charts, obtaining history from patient or surrogate, examination of patient, evaluation of patient's response to treatment, discussions with consultants and development of treatment plan with patient or surrogate   (including critical care time)  Medications Ordered in ED Medications  nitroGLYCERIN (NITROSTAT) SL tablet 0.4 mg (has no administration in time range)  morphine 4 MG/ML injection 4 mg (4 mg Intravenous Given 03/17/19 2029)  sodium chloride 0.9 % bolus 500 mL (500 mLs Intravenous New Bag/Given 03/17/19 2103)  iohexol (OMNIPAQUE) 350 MG/ML injection 100 mL (100 mLs Intravenous Contrast Given 03/17/19 2128)     Initial Impression / Assessment and Plan / ED Course  I have reviewed the triage vital signs and the nursing notes.  Pertinent labs & imaging results that were available during my care of the patient were reviewed by me and considered in my medical decision making (see chart for details).  Clinical Course as of Mar 16 2153  Tue Mar 17, 2019  2037 Discussed case with patient's cardiologist who agrees with CT scan and plan to admit.   [BM]    Clinical Course User Index [BM] Bill Salinas, PA-C   Patient arrives tachycardic around 110 bpm, hypoxic on room air approximately 90% uncomfortable appearing reproducible chest tenderness to palpation of the chest wall.  Distal pulses intact and equal in all 4 extremities.  Mild wheezing noted on lung examination.  No obvious neurologic deficits on examination, patient mildly tachypneic.  Nasal O2 placed by nursing staff with improvement of hypoxia.  Received 324 mg aspirin by EMS.  As patient with tachycardia, hypoxia and chronic  cough COVID-19 virus on differential, testing ordered however do believe less likely as patient is without fever.  CBC unremarkable CXR:    IMPRESSION:  Mild left basilar atelectasis.  D-dimer 2.00- CT angios ordered BMP was canceled, reordered with i-STAT creatinine - Troponin 0.51 EKG: Sinus tachycardia Ventricular premature complex Borderline repolarization abnormality no change from previous Confirmed by Pfeiffer Case discussed with Dr. Particia Nearing who seen the patient, as her concern for dissection at this time will avoid heparin until CT dissection study rule out. --------- Discussed case with Dr. Jacinto Halim patient's cardiologist.  Almira Coaster with CT at this time and need for admission. --------- I-STAT creatinine 1.4 BMP with elevated creatinine otherwise unremarkable CT dissection study:  IMPRESSION:  1. No evidence of aortic aneurysm, dissection, vasculitis, or  significant stenosis. Aortic Atherosclerosis (ICD10-I70.0).  2. Mild coronary artery calcific atherosclerosis.  3. New nonspecific bilateral axillary lymphadenopathy. Findings are  probably reactive, however, can be seen with systemic infection,  lymphoproliferative disorder, autoimmune disease, or granulomatous  disease/sarcoidosis.  4. Left larger than right inguinal hernias containing fat.  5. Mild prostate enlargement.   Case rediscussed with Dr. Clarice Pole, suspect ACS at this time. Care handoff given to Dr. Clarice Pole at shift change.   10:00 PM: In person discussion with admitting cardiology team at bedside. Plan to admit the patient to their service at this time.  Note: Portions of this report may have been transcribed using voice recognition software. Every effort was made to ensure accuracy; however, inadvertent computerized transcription errors may still be present. Final Clinical Impressions(s) / ED Diagnoses   Final diagnoses:  ACS (acute coronary syndrome) [facility])    ED Discharge Orders    None       Elizabeth Palau 03/17/19 2202    Bill Salinas, PA-C 03/18/19 4540    Arby Barrette, MD 03/18/19 1536

## 2019-03-17 NOTE — ED Notes (Signed)
Pharmacy messaged about unverified meds 

## 2019-03-17 NOTE — Progress Notes (Signed)
ANTICOAGULATION CONSULT NOTE - Initial Consult  Pharmacy Consult for heparin Indication: chest pain/ACS   Patient Measurements: Height: 5\' 5"  (165.1 cm) Weight: 200 lb (90.7 kg) IBW/kg (Calculated) : 61.5 Heparin Dosing Weight: 81 kg  Vital Signs: Temp: 98.6 F (37 C) (04/14 1847) Temp Source: Oral (04/14 1847) BP: 126/80 (04/14 2200) Pulse Rate: 94 (04/14 2200)  Labs: Recent Labs    03/17/19 1842 03/17/19 2029 03/17/19 2032  HGB 14.4  --   --   HCT 44.5  --   --   PLT 394  --   --   CREATININE  --  1.40* 1.45*  TROPONINI 0.51*  --   --    Assessment: Admitted with chest pain. Starting heparin infusion for rule out ACS. Patient received aspirin 324 mg in route. It appears the patient is on Xarelto prior to admission for a past VTE. However, due to the patient's condition, I cannot verify if he still taking this or when his last dose was. Discussed with cardiology, will plan to initiate heparin despite this, and will hold initial bolus.    Goal of Therapy:  Heparin level 0.3-0.7 units/ml Monitor platelets by anticoagulation protocol: Yes    Plan:  -heparin infusion 1000 units/hr -daily HL, CBC, aPTT -first level with AM labs   Baldemar Friday 03/17/2019,10:05 PM

## 2019-03-17 NOTE — ED Triage Notes (Addendum)
Patient arrived from home via [facility] with reports of left chest pain that radiates to his left arm. PT received 324mg  ASA from EMS PT also reports a sharp headache. Patient reports that he took some pain meds at home but unable to tell this RN what medicine.

## 2019-03-17 NOTE — ED Notes (Signed)
Patient transported to CT 

## 2019-03-17 NOTE — ED Notes (Signed)
Heparin verified with Sharrie Rothman

## 2019-03-18 ENCOUNTER — Inpatient Hospital Stay (HOSPITAL_COMMUNITY): Payer: Medicaid Other

## 2019-03-18 ENCOUNTER — Encounter (HOSPITAL_COMMUNITY)
Admission: EM | Disposition: A | Discharge status: Home / Self Care | Payer: Self-pay | Source: Home / Self Care | Attending: Cardiology

## 2019-03-18 ENCOUNTER — Encounter (HOSPITAL_COMMUNITY): Payer: Self-pay

## 2019-03-18 DIAGNOSIS — R7989 Other specified abnormal findings of blood chemistry: Secondary | ICD-10-CM

## 2019-03-18 DIAGNOSIS — I82409 Acute embolism and thrombosis of unspecified deep veins of unspecified lower extremity: Secondary | ICD-10-CM

## 2019-03-18 LAB — LIPID PANEL
Cholesterol: 193 mg/dL (ref 0–200)
HDL: 51 mg/dL (ref >40)
LDL Cholesterol: 113 mg/dL — ABNORMAL HIGH (ref 0–99)
Total CHOL/HDL Ratio: 3.8 RATIO
Triglycerides: 145 mg/dL (ref <150)
VLDL: 29 mg/dL (ref 0–40)

## 2019-03-18 LAB — BASIC METABOLIC PANEL
Anion gap: 10 (ref 5–15)
BUN: 16 mg/dL (ref 6–20)
CO2: 23 mmol/L (ref 22–32)
Calcium: 9 mg/dL (ref 8.9–10.3)
Chloride: 103 mmol/L (ref 98–111)
Creatinine, Ser: 1.4 mg/dL — ABNORMAL HIGH (ref 0.61–1.24)
GFR calc Af Amer: >60 mL/min (ref >60)
GFR calc non Af Amer: 56 mL/min — ABNORMAL LOW (ref >60)
Glucose, Bld: 163 mg/dL — ABNORMAL HIGH (ref 70–99)
Potassium: 3.6 mmol/L (ref 3.5–5.1)
Sodium: 136 mmol/L (ref 135–145)

## 2019-03-18 LAB — HEMOGLOBIN A1C
Hgb A1c MFr Bld: 6.7 % — ABNORMAL HIGH (ref 4.8–5.6)
Mean Plasma Glucose: 145.59 mg/dL

## 2019-03-18 LAB — TROPONIN I
Troponin I: <0.03 ng/mL (ref <0.03)
Troponin I: <0.03 ng/mL (ref <0.03)

## 2019-03-18 LAB — GLUCOSE, CAPILLARY
Glucose-Capillary: 183 mg/dL — ABNORMAL HIGH (ref 70–99)
Glucose-Capillary: 116 mg/dL — ABNORMAL HIGH (ref 70–99)

## 2019-03-18 LAB — HEPARIN LEVEL (UNFRACTIONATED): Heparin Unfractionated: <0.1 IU/mL — ABNORMAL LOW (ref 0.30–0.70)

## 2019-03-18 LAB — CBC
HCT: 38.3 % — ABNORMAL LOW (ref 39.0–52.0)
Hemoglobin: 12.3 g/dL — ABNORMAL LOW (ref 13.0–17.0)
MCH: 29.1 pg (ref 26.0–34.0)
MCHC: 32.1 g/dL (ref 30.0–36.0)
MCV: 90.8 fL (ref 80.0–100.0)
Platelets: 353 10*3/uL (ref 150–400)
RBC: 4.22 MIL/uL (ref 4.22–5.81)
RDW: 15.9 % — ABNORMAL HIGH (ref 11.5–15.5)
WBC: 9.9 10*3/uL (ref 4.0–10.5)
nRBC: 0 % (ref 0.0–0.2)

## 2019-03-18 LAB — MRSA PCR SCREENING: MRSA by PCR: NEGATIVE

## 2019-03-18 LAB — HIV ANTIBODY (ROUTINE TESTING W REFLEX): HIV Screen 4th Generation wRfx: NONREACTIVE

## 2019-03-18 SURGERY — LEFT HEART CATH AND CORONARY ANGIOGRAPHY
Anesthesia: LOCAL

## 2019-03-18 MED ORDER — SODIUM CHLORIDE 0.9 % WEIGHT BASED INFUSION
3.0000 mL/kg/h | INTRAVENOUS | Status: AC
  Administered 2019-03-18: 10:00:00 3 mL/kg/h via INTRAVENOUS

## 2019-03-18 MED ORDER — SODIUM CHLORIDE 0.9% FLUSH: 3.0000 mL | Freq: Two times a day (BID) | INTRAVENOUS | Status: DC

## 2019-03-18 MED ORDER — SODIUM CHLORIDE 0.9% FLUSH: 3.0000 mL | INTRAVENOUS | Status: DC | PRN

## 2019-03-18 MED ORDER — PNEUMOCOCCAL VAC POLYVALENT 25 MCG/0.5ML IJ INJ: 0.5000 mL | INJECTION | INTRAMUSCULAR | Status: DC

## 2019-03-18 MED ORDER — ASPIRIN 81 MG PO TBEC: 81.0000 mg | DELAYED_RELEASE_TABLET | Freq: Every day | ORAL | 3 refills | Status: DC

## 2019-03-18 MED ORDER — SODIUM CHLORIDE 0.9 % IV SOLN: 250.0000 mL | INTRAVENOUS | Status: DC | PRN

## 2019-03-18 MED ORDER — ASPIRIN 81 MG PO CHEW: 81.0000 mg | CHEWABLE_TABLET | ORAL | Status: AC

## 2019-03-18 MED ORDER — NITROGLYCERIN 0.4 MG SL SUBL: 0.4000 mg | SUBLINGUAL_TABLET | SUBLINGUAL | 3 refills | Status: DC | PRN

## 2019-03-18 MED ORDER — SODIUM CHLORIDE 0.9 % WEIGHT BASED INFUSION: 1.0000 mL/kg/h | INTRAVENOUS | Status: DC

## 2019-03-18 NOTE — Discharge Summary (Signed)
Physician Discharge Summary  Patient ID: Darryl Hurley MRN: [id number] DOB/AGE: 1961-10-17 58 y.o.  Admit date: 03/17/2019 Discharge date: 03/18/2019  Primary Discharge Diagnosis: Chest pain Shortness of breath Hypertension  Secondary Discharge Diagnosis: Chest pain Shortness of breath Hypertension   Hospital Course:   58 -year-old African-American male with hypertension, type II diabetes mellitus, tobacco and alcohol abuse, gout, midly reduced LVEF and mildly abnormal stress test (01/2018).  Patient presented to the ED on 04/14/020 with intermittent left sided sharp chest pain, associated with shortness of breath since yesterday 03/16/2019. Pain was responsive no NTG today. He has chronic cough with no increase in sputum production. He denies any fever, chills, sick contacts. In the ED, D-dimer was elevated. Given radiation of chest poin to back, he underwent CTA looking for dissection. No dissection was seen. He is supposed to be on Xarelto for DVT, but has been out of it for many days.. Of note, D-dimer was also elevated in 2010 with CTA negative for PE. He was tested for COVID 19 given cough, which is pending. While I was talking to the patient, he had sudden sharp left sided chest pain, lasting only for few seconds. He states that this pain is similar to what he has been having.   While his initial trop in the ED was elevated at 0.51, two subsequent troponins are <0.03. His chest pain by itself does not reflect angina. I think the 0.51 level of troponin was spuriously high. He has not had any chest pain since admission. I do not think he warrants coronary angiography.   His d-Dimer is elevated. However, he has had previously elevation in d-Dimer, with multiple CTA negative for PE, most recently in 10/2018. US is negative for DVT. Echocardiogram was performed, but images are awaited. Overall, suspicion for PE is low. I have stopped his Xarelto, which he was not taking anyway.   BP is  well controlled. While he is on clonidine and beta blocker, he has not had any significant bradycardia.   To summarize, I think his chest pain is of musculoskeletal etiology with no definitive pathology noted. He may use tylenol for pain.   Discharge Exam: Blood pressure 107/89, pulse 81, temperature 98.1 F (36.7 C), temperature source Oral, resp. rate 19, height 5\' 5"  (1.651 m), weight 82 kg, SpO2 96 %.   Constitutional: He is oriented to person, place, and time. He appears well-developed and well-nourished. No distress.  HENT:  Head: Normocephalic and atraumatic.  Eyes: Pupils are equal, round, and reactive to light. Conjunctivae are normal.  Neck: No JVD present.  Cardiovascular: Normal rate, regular rhythm and intact distal pulses.  No murmur heard. Pulmonary/Chest: Effort normal and breath sounds normal. He has no wheezes. He has no rales.  Abdominal: Soft. Bowel sounds are normal. There is no rebound.  Musculoskeletal:        General: No edema.  Lymphadenopathy:    He has no cervical adenopathy.  Neurological: He is alert and oriented to person, place, and time. No cranial nerve deficit.  Skin: Skin is warm and dry.  Psychiatric: He has a normal mood and affect.  Nursing note and vitals reviewed.    Significant Diagnostic Studies:  CARDIAC STUDIES:  EKG 03/17/2019: Sinus tachycardia. Nonspecific ST-T changes  Lexiscan myoview stress test 01/31/2018:  1. Pharmacologic stress testing was performed with intravenous administration of .4 mg of Lexiscan over a 10-15 seconds infusion. Patient was hypertensive throughout the study, peak BP 200/120 mmHg. Stress symptoms included dyspnea,  dizziness.  2. Exercise capacity not assessed. Stress EKG is non diagnostic for ischemia as it is a pharmacologic stress.  3. The overall quality of the study is good. Left ventricular cavity is noted to be normal on the rest and stress studies. Gated SPECT images reveal normal myocardial  thickening and wall motion. The left ventricular ejection fraction was calculated or visually estimated to be 41%. SPECT images demonstrate small perfusion abnormality of mild intensity in the mid inferolateral myocardial wall(s) on the stress images with mild reversibility on rest images.  4. Low to intermediate risk study.   Echocardiogram 02/11/2018:  Left ventricle cavity is normal in size. Mild concentric hypertrophy of the left ventricle. Mild decrease in global wall motion. Visual EF is 45-50%. Doppler evidence of grade I (impaired) diastolic dysfunction, normal LAP. Calculated EF 45%.  Left atrial cavity is mildly dilated.  Inadequate tricuspid regurgitation jet to estimate pulmonary artery pressure  IVC is dilated with blunted respiratory response. Estimated RA pressure 10-15 mmHg.  Labs:   Lab Results  Component Value Date   WBC 9.9 03/18/2019   HGB 12.3 (L) 03/18/2019   HCT 38.3 (L) 03/18/2019   MCV 90.8 03/18/2019   PLT 353 03/18/2019    Recent Labs  Lab 03/18/19 1005  NA 136  K 3.6  CL 103  CO2 23  BUN 16  CREATININE 1.40*  CALCIUM 9.0  GLUCOSE 163*    Lipid Panel     Component Value Date/Time   CHOL 193 03/18/2019 0504   TRIG 145 03/18/2019 0504   HDL 51 03/18/2019 0504   CHOLHDL 3.8 03/18/2019 0504   VLDL 29 03/18/2019 0504   LDLCALC 113 (H) 03/18/2019 0504    BNP (last 3 results) Recent Labs    11/24/18 1448  BNP 29.6    HEMOGLOBIN A1C Lab Results  Component Value Date   HGBA1C 6.7 (H) 03/18/2019   MPG 145.59 03/18/2019    Cardiac Panel (last 3 results) Recent Labs    03/17/19 1842 03/18/19 0811 03/18/19 1115  TROPONINI 0.51* <0.03 <0.03    Lab Results  Component Value Date   CKTOTAL 406 (H) 07/21/2012   CKMB 2.0 07/21/2012   TROPONINI <0.03 03/18/2019     TSH No results for input(s): TSH in the last 8760 hours.  Radiology: Dg Chest Port 1 View  Result Date: 03/17/2019 CLINICAL DATA:  Left-sided chest pain EXAM: PORTABLE  CHEST 1 VIEW COMPARISON:  11/24/2018 FINDINGS: Cardiac shadows within normal limits. The lungs are well aerated bilaterally. Mild left basilar atelectasis is noted. No other focal abnormality is noted. No bony abnormality is seen. IMPRESSION: Mild left basilar atelectasis. Electronically Signed   By: Alcide Clever M.D.   On: 03/17/2019 20:10   Ct Angio Chest/abd/pel For Dissection W And/or Wo Contrast  Result Date: 03/17/2019 CLINICAL DATA:  58 y/o M; left-sided chest pain radiating to the left arm. EXAM: CT ANGIOGRAPHY CHEST, ABDOMEN AND PELVIS TECHNIQUE: Multidetector CT imaging through the chest, abdomen and pelvis was performed using the standard protocol during bolus administration of intravenous contrast. Multiplanar reconstructed images and MIPs were obtained and reviewed to evaluate the vascular anatomy. CONTRAST:  OMNIPAQUE IOHEXOL 350 MG/ML SOLN COMPARISON:  10/22/2018 CT chest. FINDINGS: CTA CHEST FINDINGS Cardiovascular: Preferential opacification of the thoracic aorta. No evidence of thoracic aortic aneurysm or dissection. Normal heart size. No pericardial effusion. Mild coronary artery calcific atherosclerosis. Satisfactory opacification of the pulmonary arteries. No pulmonary embolus identified. Mediastinum/Nodes: No enlarged mediastinal or hilar lymph  nodes. Bilateral axillary lymphadenopathy is new from prior CT of chest with the largest node on the left measuring up to 20 mm short axis (series 6, image 17). Thyroid gland, trachea, and esophagus are unremarkable. Lungs/Pleura: Lungs are clear. No pleural effusion or pneumothorax. Musculoskeletal: No chest wall abnormality. No acute or significant osseous findings. Review of the MIP images confirms the above findings. CTA ABDOMEN AND PELVIS FINDINGS VASCULAR Aorta: Normal caliber aorta without aneurysm, dissection, vasculitis or significant stenosis. Celiac: Patent without evidence of aneurysm, dissection, vasculitis or significant stenosis.  SMA: Patent without evidence of aneurysm, dissection, vasculitis or significant stenosis. Renals: Both renal arteries are patent without evidence of aneurysm, dissection, vasculitis, fibromuscular dysplasia or significant stenosis. IMA: Patent without evidence of aneurysm, dissection, vasculitis or significant stenosis. Inflow: Patent without evidence of aneurysm, dissection, vasculitis or significant stenosis. Veins: No obvious venous abnormality within the limitations of this arterial phase study. Review of the MIP images confirms the above findings. NON-VASCULAR Hepatobiliary: Subcentimeter hyperdense foci are present within the liver segments 3, 5, and 6 (series 6, image 119, 134, 142), likely flash hemangioma. Otherwise no focal liver abnormality is seen. No gallstones, gallbladder wall thickening, or biliary dilatation. Pancreas: Unremarkable. No pancreatic ductal dilatation or surrounding inflammatory changes. Spleen: Normal in size without focal abnormality. Adrenals/Urinary Tract: Adrenal glands are unremarkable. Kidneys are normal, without renal calculi, focal lesion, or hydronephrosis. Bladder is unremarkable. Stomach/Bowel: Stomach is within normal limits. Appendix appears normal. No evidence of bowel wall thickening, distention, or inflammatory changes. Lymphatic: Aortic and iliofemoral atherosclerosis. No enlarged abdominal or pelvic lymph nodes. Reproductive: Mild prostate enlargement. Other: Left larger than right inguinal hernias containing fat. Musculoskeletal: No fracture is seen. Review of the MIP images confirms the above findings. IMPRESSION: 1. No evidence of aortic aneurysm, dissection, vasculitis, or significant stenosis. Aortic Atherosclerosis (ICD10-I70.0). 2. Mild coronary artery calcific atherosclerosis. 3. New nonspecific bilateral axillary lymphadenopathy. Findings are probably reactive, however, can be seen with systemic infection, lymphoproliferative disorder, autoimmune disease, or  granulomatous disease/sarcoidosis. 4. Left larger than right inguinal hernias containing fat. 5. Mild prostate enlargement. Electronically Signed   By: Mitzi Hansen M.D.   On: 03/17/2019 21:45   Vas [place] Lower Extremity Venous (dvt)  Result Date: 03/18/2019  Lower Venous Study Other Indications: History of right ptv and peroneal veins. Elevated d-Dimer. Performing Technologist: Toma [employer]it[employer] RVS  Examination Guidelines: A complete evaluation includes B-mode imaging, spectral Doppler, color Doppler, and power Doppler as needed of all accessible portions of each vessel. Bilateral testing is considered an integral part of a complete examination. Limited examinations for reoccurring indications may be performed as noted.  +---------+---------------+---------+-----------+----------+-------+ RIGHT    CompressibilityPhasicitySpontaneityPropertiesSummary +---------+---------------+---------+-----------+----------+-------+ CFV      Full           Yes      Yes                          +---------+---------------+---------+-----------+----------+-------+ SFJ      Full                                                 +---------+---------------+---------+-----------+----------+-------+ FV Prox  Full           Yes      Yes                          +---------+---------------+---------+-----------+----------+-------+  FV Mid   Full                                                 +---------+---------------+---------+-----------+----------+-------+ FV DistalFull           Yes      Yes                          +---------+---------------+---------+-----------+----------+-------+ PFV      Full           Yes      Yes                          +---------+---------------+---------+-----------+----------+-------+ POP      Full           Yes      Yes                          +---------+---------------+---------+-----------+----------+-------+ PTV      Full                                                  +---------+---------------+---------+-----------+----------+-------+ PERO     Full                                                 +---------+---------------+---------+-----------+----------+-------+   Right Technical Findings: Previous DVT appears to have resolved  +---------+---------------+---------+-----------+----------+-------+ LEFT     CompressibilityPhasicitySpontaneityPropertiesSummary +---------+---------------+---------+-----------+----------+-------+ CFV      Full           Yes      Yes                          +---------+---------------+---------+-----------+----------+-------+ SFJ      Full                                                 +---------+---------------+---------+-----------+----------+-------+ FV Prox  Full           Yes      Yes                          +---------+---------------+---------+-----------+----------+-------+ FV Mid   Full                                                 +---------+---------------+---------+-----------+----------+-------+ FV DistalFull           Yes      Yes                          +---------+---------------+---------+-----------+----------+-------+ PFV      Full  Yes      Yes                          +---------+---------------+---------+-----------+----------+-------+ POP      Full           Yes      Yes                          +---------+---------------+---------+-----------+----------+-------+ PTV      Full                                                 +---------+---------------+---------+-----------+----------+-------+ PERO     Full                                                 +---------+---------------+---------+-----------+----------+-------+     Summary: Right: There is no evidence of deep vein thrombosis in the lower extremity. No cystic structure found in the popliteal fossa. Left: There is no evidence of deep vein  thrombosis in the lower extremity. No cystic structure found in the popliteal fossa.  *See table(s) above for measurements and observations.    Preliminary       FOLLOW UP PLANS AND APPOINTMENTS Discharge Instructions    Diet - low sodium heart healthy   Complete by:  As directed    Increase activity slowly   Complete by:  As directed      Allergies as of 03/18/2019      Reactions   Nsaids Other (See Comments)   Vagale down reaction   Toradol [ketorolac Tromethamine]    vagale down reaction      Medication List    STOP taking these medications   doxycycline 100 MG capsule Commonly known as:  VIBRAMYCIN   Rivaroxaban 15 & 20 MG Tbpk     TAKE these medications   albuterol 108 (90 Base) MCG/ACT inhaler Commonly known as:  PROVENTIL HFA;VENTOLIN HFA Inhale 1 puff into the lungs every 6 (six) hours as needed for wheezing or shortness of breath.   allopurinol 100 MG tablet Commonly known as:  ZYLOPRIM Take 100 mg by mouth daily.   amLODipine 10 MG tablet Commonly known as:  NORVASC Take 10 mg by mouth daily.   aspirin 81 MG EC tablet Take 1 tablet (81 mg total) by mouth daily. Start taking on:  March 19, 2019   cloNIDine 0.1 MG tablet Commonly known as:  CATAPRES Take 0.1 mg by mouth 2 (two) times daily.   Flovent HFA 110 MCG/ACT inhaler Generic drug:  fluticasone Inhale 1 puff into the lungs 2 (two) times daily as needed (shortness of breath).   hydrochlorothiazide 25 MG tablet Commonly known as:  HYDRODIURIL Take 25 mg by mouth daily.   labetalol 200 MG tablet Commonly known as:  NORMODYNE Take 1 tablet (200 mg total) by mouth 2 (two) times daily.   losartan 100 MG tablet Commonly known as:  Cozaar Take 1 tablet (100 mg total) by mouth daily.   metFORMIN 1000 MG tablet Commonly known as:  GLUCOPHAGE Take 1 tablet (1,000 mg total) by mouth 2 (two) times daily with a meal.   nitroGLYCERIN 0.4 MG  SL tablet Commonly known as:  NITROSTAT Place 1 tablet  (0.4 mg total) under the tongue every 5 (five) minutes x 3 doses as needed for chest pain.   oxyCODONE-acetaminophen 5-325 MG tablet Commonly known as:  PERCOCET/ROXICET Take 1-2 tablets by mouth every 6 (six) hours as needed for severe pain.   rosuvastatin 40 MG tablet Commonly known as:  CRESTOR Take 40 mg by mouth daily.   tiZANidine 4 MG capsule Commonly known as:  ZANAFLEX Take 4 mg by mouth as needed for muscle spasms.         Truett Mainland MD, [facility] Pager: [phone] Office: [phone] If no answer: [phone]

## 2019-03-18 NOTE — Progress Notes (Signed)
ANTICOAGULATION CONSULT NOTE   Pharmacy Consult for Heparin Indication: chest pain/ACS   Patient Measurements: Height: 5\' 5"  (165.1 cm) Weight: 180 lb 12.4 oz (82 kg) IBW/kg (Calculated) : 61.5 Heparin Dosing Weight: 81 kg  Vital Signs: Temp: 98.3 F (36.8 C) (04/15 0200) Temp Source: Oral (04/15 0200) BP: 134/90 (04/15 0200) Pulse Rate: 85 (04/15 0200)  Labs: Recent Labs    03/17/19 1842 03/17/19 2029 03/17/19 2032 03/17/19 2239 03/18/19 0504  HGB 14.4  --   --   --   --   HCT 44.5  --   --   --   --   PLT 394  --   --   --   --   APTT  --   --   --  33  --   HEPARINUNFRC  --   --   --  <0.10* <0.10*  CREATININE  --  1.40* 1.45*  --   --   TROPONINI 0.51*  --   --   --   --    Assessment: Admitted with chest pain. Starting heparin infusion for rule out ACS. Patient received aspirin 324 mg in route. It appears the patient is on Xarelto prior to admission for a past VTE. However, due to the patient's condition, I cannot verify if he still taking this or when his last dose was. Discussed with cardiology, will plan to initiate heparin despite this, and will hold initial bolus.    4/15 AM update: heparin level low, baseline heparin level was also undetectable indicating pt likely not taking Xarelto  Goal of Therapy:  Heparin level 0.3-0.7 units/ml Monitor platelets by anticoagulation protocol: Yes   Plan:  -Inc heparin to 1350 units/hr -Re-check heparin level in 8 hours  Abran Duke, PharmD, BCPS Clinical Pharmacist Phone: [phone]

## 2019-03-18 NOTE — Progress Notes (Signed)
2D Echocardiogram has been performed.  Pieter Partridge 03/18/2019, 11:37 AM

## 2019-03-18 NOTE — Progress Notes (Signed)
Bilateral lower extremity venous duplex completed. Perliminary results in Chart review CV Proc. [employer], RVS 03/18/2019, 10:32 AM

## 2019-03-18 NOTE — Progress Notes (Signed)
Pt stable, dc home via wheelchair, NT accompanied to front door

## 2019-03-18 NOTE — ED Notes (Signed)
ED TO INPATIENT HANDOFF REPORT  ED Nurse Name and Phone #: [phone] Windy Kalata  S Name/Age/Gender Darryl Hurley 58 y.o. male Room/Bed: 023C/023C  Code Status   Code Status: Full Code  Home/SNF/Other Triage Complete: Triage complete  Chief Complaint cp  Triage Note Patient arrived from home via [facility] with reports of left chest pain that radiates to his left arm. PT received  ASA from EMS PT also reports a sharp headache. Patient reports that he took some pain meds at home but unable to tell this RN what medicine.    Allergies Allergies  Allergen Reactions  . Nsaids Other (See Comments)    Vagale down reaction  . Toradol [Ketorolac Tromethamine]     vagale down reaction    Level of Care/Admitting Diagnosis ED Disposition    ED Disposition Condition Comment   Admit  Hospital Area: [facility] [[id number]]  Level of Care: Progressive [102]  Diagnosis: NSTEMI (non-ST elevated myocardial infarction) [facility]) [[id number]]  Admitting Physician: Elder Negus [[id number]]  Attending Physician: Elder Negus [[id number]]  Estimated length of stay: past midnight tomorrow  Certification:: I certify this patient will need inpatient services for at least 2 midnights  Possible Covid Disease Patient Isolation: Low Risk  (Less than 4L Saxis supplementation)  PT Class (Do Not Modify): Inpatient [101]  PT Acc Code (Do Not Modify): Private [1]       B Medical/Surgery History Past Medical History:  Diagnosis Date  . Alcohol abuse   . Angina pectoris   . Arthritis   . Asthma   . Chronic lower back pain   . COPD (chronic obstructive pulmonary disease) (HCC)    on home O2 2L  . Diabetes mellitus without complication (HCC)   . Exertional shortness of breath   . Gout   . Hypertension   . Seizures (HCC)    Past Surgical History:  Procedure Laterality Date  . NO PAST SURGERIES       A IV Location/Drains/Wounds Patient Lines/Drains/Airways Status   Active  Line/Drains/Airways    Name:   Placement date:   Placement time:   Site:   Days:   Peripheral IV 03/17/19 Left Forearm   03/17/19    1744    Forearm   1   Peripheral IV 03/17/19 Left Antecubital   03/17/19    2027    Antecubital   1          Intake/Output Last 24 hours  Intake/Output Summary (Last 24 hours) at 03/18/2019 0151 Last data filed at 03/17/2019 2235 Gross per 24 hour  Intake 500 ml  Output -  Net 500 ml    Labs/Imaging Results for orders placed or performed during the hospital encounter of 03/17/19 (from the past 48 hour(s))  Troponin I - Once     Status: Abnormal   Collection Time: 03/17/19  6:42 PM  Result Value Ref Range   Troponin I 0.51 (HH) <0.03 ng/mL    Comment: CRITICAL RESULT CALLED TO, READ BACK BY AND VERIFIED WITH: Teressa Senter RN AT 1955 ON [id number] BY Marveen Reeks Performed at [facility], [address] [place], [place] [address]   CBC with Differential     Status: Abnormal   Collection Time: 03/17/19  6:42 PM  Result Value Ref Range   WBC 9.7 4.0 - 10.5 K/uL   RBC 4.96 4.22 - 5.81 MIL/uL   Hemoglobin 14.4 13.0 - 17.0 g/dL   HCT 46.9 62.9 - 52.8 %  MCV 89.7 80.0 - 100.0 fL   MCH 29.0 26.0 - 34.0 pg   MCHC 32.4 30.0 - 36.0 g/dL   RDW 04.515.6 (H) 40.911.5 - 81.115.5 %   Platelets 394 150 - 400 K/uL   nRBC 0.0 0.0 - 0.2 %   Neutrophils Relative % 77 %   Neutro Abs 7.3 1.7 - 7.7 K/uL   Lymphocytes Relative 15 %   Lymphs Abs 1.5 0.7 - 4.0 K/uL   Monocytes Relative 8 %   Monocytes Absolute 0.8 0.1 - 1.0 K/uL   Eosinophils Relative 0 %   Eosinophils Absolute 0.0 0.0 - 0.5 K/uL   Basophils Relative 0 %   Basophils Absolute 0.0 0.0 - 0.1 K/uL   Immature Granulocytes 0 %   Abs Immature Granulocytes 0.03 0.00 - 0.07 K/uL    Comment: Performed at [facility], [address] [place], [place] [address]  D-dimer, quantitative (not at [facility])     Status: Abnormal   Collection Time: 03/17/19  6:42 PM  Result Value Ref Range   D-Dimer, Quant 2.00 (H)  0.00 - 0.50 ug/mL-FEU    Comment: (NOTE) At the manufacturer cut-off of 0.50 ug/mL FEU, this assay has been documented to exclude PE with a sensitivity and negative predictive value of 97 to 99%.  At this time, this assay has not been approved by the FDA to exclude DVT/VTE. Results should be correlated with clinical presentation. Performed at [facility], [address] [place], [place] [address]   I-Stat Creatinine, ED (not at [facility])     Status: Abnormal   Collection Time: 03/17/19  8:29 PM  Result Value Ref Range   Creatinine, Ser 1.40 (H) 0.61 - 1.24 mg/dL  SARS Coronavirus 2 [facility](Hospital order, Performed in [facility] hospital lab)     Status: None   Collection Time: 03/17/19  8:32 PM  Result Value Ref Range   SARS Coronavirus 2 NEGATIVE NEGATIVE    Comment: (NOTE) If result is NEGATIVE SARS-CoV-2 target nucleic acids are NOT DETECTED. The SARS-CoV-2 RNA is generally detectable in upper and lower  respiratory specimens during the acute phase of infection. The lowest  concentration of SARS-CoV-2 viral copies this assay can detect is 250  copies / mL. A negative result does not preclude SARS-CoV-2 infection  and should not be used as the sole basis for treatment or other  patient management decisions.  A negative result may occur with  improper specimen collection / handling, submission of specimen other  than nasopharyngeal swab, presence of viral mutation(s) within the  areas targeted by this assay, and inadequate number of viral copies  (<250 copies / mL). A negative result must be combined with clinical  observations, patient history, and epidemiological information. If result is POSITIVE SARS-CoV-2 target nucleic acids are DETECTED. The SARS-CoV-2 RNA is generally detectable in upper and lower  respiratory specimens dur ing the acute phase of infection.  Positive  results are indicative of active infection with SARS-CoV-2.  Clinical  correlation with patient history  and other diagnostic information is  necessary to determine patient infection status.  Positive results do  not rule out bacterial infection or co-infection with other viruses. If result is PRESUMPTIVE POSTIVE SARS-CoV-2 nucleic acids MAY BE PRESENT.   A presumptive positive result was obtained on the submitted specimen  and confirmed on repeat testing.  While 2019 novel coronavirus  (SARS-CoV-2) nucleic acids may be present in the submitted sample  additional confirmatory testing may be  necessary for epidemiological  and / or clinical management purposes  to differentiate between  SARS-CoV-2 and other Sarbecovirus currently known to infect humans.  If clinically indicated additional testing with an alternate test  methodology [phone]) is advised. The SARS-CoV-2 RNA is generally  detectable in upper and lower respiratory sp ecimens during the acute  phase of infection. The expected result is Negative. Fact Sheet for Patients:  BoilerBrush.com.cy Fact Sheet for Healthcare Providers: https://pope.com/ This test is not yet approved or cleared by the [place] FDA and has been authorized for detection and/or diagnosis of SARS-CoV-2 by FDA under an Emergency Use Authorization (EUA).  This EUA will remain in effect (meaning this test can be used) for the duration of the COVID-19 declaration under Section 564(b)(1) of the Act, 21 U.S.C. section 360bbb-3(b)(1), unless the authorization is terminated or revoked sooner. Performed at [facility], [address] [place], [place] [address]   Basic metabolic panel     Status: Abnormal   Collection Time: 03/17/19  8:32 PM  Result Value Ref Range   Sodium 136 135 - 145 mmol/L   Potassium 3.6 3.5 - 5.1 mmol/L   Chloride 102 98 - 111 mmol/L   CO2 22 22 - 32 mmol/L   Glucose, Bld 161 (H) 70 - 99 mg/dL   BUN 16 6 - 20 mg/dL   Creatinine, Ser 8.11 (H) 0.61 - 1.24 mg/dL   Calcium 9.5 8.9 -  91.4 mg/dL   GFR calc non Af Amer 53 (L) >60 mL/min   GFR calc Af Amer >60 >60 mL/min   Anion gap 12 5 - 15    Comment: Performed at [facility], [address], [place], [place] [address]  APTT     Status: None   Collection Time: 03/17/19 10:39 PM  Result Value Ref Range   aPTT 33 24 - 36 seconds    Comment: Performed at [facility], [address] [place], [place] [address]  Heparin level (unfractionated)     Status: Abnormal   Collection Time: 03/17/19 10:39 PM  Result Value Ref Range   Heparin Unfractionated <0.10 (L) 0.30 - 0.70 IU/mL    Comment: (NOTE) If heparin results are below expected values, and patient dosage has  been confirmed, suggest follow up testing of antithrombin III levels. Performed at [facility], [address] [place], [place] [address]   CBG monitoring, ED     Status: Abnormal   Collection Time: 03/17/19 10:40 PM  Result Value Ref Range   Glucose-Capillary 120 (H) 70 - 99 mg/dL   Dg Chest Port 1 View  Result Date: 03/17/2019 CLINICAL DATA:  Left-sided chest pain EXAM: PORTABLE CHEST 1 VIEW COMPARISON:  11/24/2018 FINDINGS: Cardiac shadows within normal limits. The lungs are well aerated bilaterally. Mild left basilar atelectasis is noted. No other focal abnormality is noted. No bony abnormality is seen. IMPRESSION: Mild left basilar atelectasis. Electronically Signed   By: Alcide Clever M.D.   On: 03/17/2019 20:10   Ct Angio Chest/abd/pel For Dissection W And/or Wo Contrast  Result Date: 03/17/2019 CLINICAL DATA:  58 y/o M; left-sided chest pain radiating to the left arm. EXAM: CT ANGIOGRAPHY CHEST, ABDOMEN AND PELVIS TECHNIQUE: Multidetector CT imaging through the chest, abdomen and pelvis was performed using the standard protocol during bolus administration of intravenous contrast. Multiplanar reconstructed images and MIPs were obtained and reviewed to evaluate the vascular anatomy. CONTRAST:  OMNIPAQUE IOHEXOL 350 MG/ML SOLN  COMPARISON:  10/22/2018 CT chest. FINDINGS: CTA CHEST FINDINGS Cardiovascular: Preferential opacification of the thoracic aorta. No evidence of thoracic aortic aneurysm or dissection. Normal heart size. No pericardial effusion. Mild coronary artery calcific atherosclerosis. Satisfactory opacification of the pulmonary arteries. No pulmonary embolus identified. Mediastinum/Nodes: No enlarged mediastinal or hilar lymph nodes. Bilateral axillary lymphadenopathy is new from prior CT of chest with the largest node on the left measuring up to 20 mm short axis (series 6, image 17). Thyroid gland, trachea, and esophagus are unremarkable. Lungs/Pleura: Lungs are clear. No pleural effusion or pneumothorax. Musculoskeletal: No chest wall abnormality. No acute or significant osseous findings. Review of the MIP images confirms the above findings. CTA ABDOMEN AND PELVIS FINDINGS VASCULAR Aorta: Normal caliber aorta without aneurysm, dissection, vasculitis or significant stenosis. Celiac: Patent without evidence of aneurysm, dissection, vasculitis or significant stenosis. SMA: Patent without evidence of aneurysm, dissection, vasculitis or significant stenosis. Renals: Both renal arteries are patent without evidence of aneurysm, dissection, vasculitis, fibromuscular dysplasia or significant stenosis. IMA: Patent without evidence of aneurysm, dissection, vasculitis or significant stenosis. Inflow: Patent without evidence of aneurysm, dissection, vasculitis or significant stenosis. Veins: No obvious venous abnormality within the limitations of this arterial phase study. Review of the MIP images confirms the above findings. NON-VASCULAR Hepatobiliary: Subcentimeter hyperdense foci are present within the liver segments 3, 5, and 6 (series 6, image 119, 134, 142), likely flash hemangioma. Otherwise no focal liver abnormality is seen. No gallstones, gallbladder wall thickening, or biliary dilatation. Pancreas: Unremarkable. No pancreatic  ductal dilatation or surrounding inflammatory changes. Spleen: Normal in size without focal abnormality. Adrenals/Urinary Tract: Adrenal glands are unremarkable. Kidneys are normal, without renal calculi, focal lesion, or hydronephrosis. Bladder is unremarkable. Stomach/Bowel: Stomach is within normal limits. Appendix appears normal. No evidence of bowel wall thickening, distention, or inflammatory changes. Lymphatic: Aortic and iliofemoral atherosclerosis. No enlarged abdominal or pelvic lymph nodes. Reproductive: Mild prostate enlargement. Other: Left larger than right inguinal hernias containing fat. Musculoskeletal: No fracture is seen. Review of the MIP images confirms the above findings. IMPRESSION: 1. No evidence of aortic aneurysm, dissection, vasculitis, or significant stenosis. Aortic Atherosclerosis (ICD10-I70.0). 2. Mild coronary artery calcific atherosclerosis. 3. New nonspecific bilateral axillary lymphadenopathy. Findings are probably reactive, however, can be seen with systemic infection, lymphoproliferative disorder, autoimmune disease, or granulomatous disease/sarcoidosis. 4. Left larger than right inguinal hernias containing fat. 5. Mild prostate enlargement. Electronically Signed   By: Mitzi Hansen M.D.   On: 03/17/2019 21:45    Pending Labs Unresulted Labs (From admission, onward)    Start     Ordered   03/19/19 0500  Heparin level (unfractionated)  Daily,   R     03/17/19 2226   03/18/19 1100  Basic metabolic panel  Daily,   R     03/17/19 2218   03/18/19 1100  Hemoglobin A1c  Once,   R    Comments:  To assess prior glycemic control    03/17/19 2224   03/18/19 1100  CBC  Daily,   R     03/17/19 2239   03/18/19 0600  Heparin level (unfractionated)  Once-Timed,   R     03/17/19 2222   03/18/19 0500  Lipid panel  Tomorrow morning,   R     03/17/19 2218   03/17/19 2215  HIV antibody (Routine Testing)  Once,   R     03/17/19 2218          Vitals/Pain Today's  Vitals   03/18/19 0100 03/18/19  0115 03/18/19 0130 03/18/19 0145  BP: (!) 144/92 126/80 125/85 (!) 131/91  Pulse: 87 87 86 85  Resp: Temp:      TempSrc:      SpO2: 96% 96% 94% 96%  Weight:      Height:      PainSc:        Isolation Precautions No active isolations  Medications Medications  aspirin chewable tablet 324 mg (0 mg Oral Hold 03/17/19 2223)    Or  aspirin suppository 300 mg ( Rectal See Alternative 03/17/19 2223)  aspirin EC tablet 81 mg (has no administration in time range)  nitroGLYCERIN (NITROSTAT) SL tablet 0.4 mg (has no administration in time range)  acetaminophen (TYLENOL) tablet 650 mg (has no administration in time range)  ondansetron (ZOFRAN) injection 4 mg (has no administration in time range)  LORazepam (ATIVAN) tablet 1 mg (has no administration in time range)    Or  LORazepam (ATIVAN) injection 1 mg (has no administration in time range)  thiamine (VITAMIN B-1) tablet 100 mg (has no administration in time range)    Or  thiamine (B-1) injection 100 mg (has no administration in time range)  folic acid (FOLVITE) tablet 1 mg (has no administration in time range)  multivitamin with minerals tablet 1 tablet (has no administration in time range)  albuterol (PROVENTIL) (2.5 MG/3ML) 0.083% nebulizer solution 3 mL (has no administration in time range)  allopurinol (ZYLOPRIM) tablet 100 mg (has no administration in time range)  amLODipine (NORVASC) tablet 10 mg (has no administration in time range)  cloNIDine (CATAPRES) tablet 0.1 mg (has no administration in time range)  budesonide (PULMICORT) nebulizer solution 0.25 mg (has no administration in time range)  hydrochlorothiazide (HYDRODIURIL) tablet 25 mg (has no administration in time range)  labetalol (NORMODYNE) tablet 200 mg (200 mg Oral Given 03/17/19 2343)  oxyCODONE-acetaminophen (PERCOCET/ROXICET) 5-325 MG per tablet 1-2 tablet (has no administration in time range)  rosuvastatin (CRESTOR) tablet  40 mg (has no administration in time range)  tiZANidine (ZANAFLEX) tablet 4 mg (4 mg Oral Given 03/17/19 2343)  0.9 %  sodium chloride infusion (1 mL Intravenous New Bag/Given 03/17/19 2245)  heparin ADULT infusion 100 units/mL (25000 units/271mL sodium chloride 0.45%) (1,000 Units/hr Intravenous New Bag/Given 03/17/19 2248)  labetalol (NORMODYNE,TRANDATE) injection 10 mg (has no administration in time range)  insulin aspart (novoLOG) injection 0-9 Units (has no administration in time range)  nitroGLYCERIN (NITRODUR - Dosed in mg/24 hr) patch 0.4 mg (has no administration in time range)  morphine 4 MG/ML injection 4 mg (4 mg Intravenous Given 03/17/19 2029)  sodium chloride 0.9 % bolus 500 mL (0 mLs Intravenous Stopped 03/17/19 2235)  iohexol (OMNIPAQUE) 350 MG/ML injection 100 mL (100 mLs Intravenous Contrast Given 03/17/19 2128)    Mobility walks with person assist Medium fall risk  Focused Assessments Pt is SR to ST on monitor.   R Recommendations: See Admitting Provider Note  Report given to:   Additional Notes: Pt is AO x 4 no skin problems, up with assistance, using urinal.

## 2019-03-19 ENCOUNTER — Telehealth: Payer: Self-pay

## 2019-03-19 NOTE — Telephone Encounter (Signed)
-----   Message from Elder Negus, MD sent at 03/19/2019  8:36 AM EDT ----- Regarding: TOC Discharge follow up: TOC: Needed Follow up appt: Scheduled Discharge diagnosis: Chest pain Discharge date: 03/18/2019  Thanks MJP

## 2019-03-19 NOTE — Telephone Encounter (Signed)
Location of hospitalization:     Reason for hospitalization:  Chest Pain Date of discharge:  03/18/19 Date of first communication with patient:   03/19/19   Person contacting patient: Valeria Boza b Current symptoms:  none Do you understand why you were in the Hospital: Yes Questions regarding discharge instructions: None Where were you discharged to: Home Medications reviewed: Yes Allergies reviewed: Yes Dietary changes reviewed: Yes. Discussed low fat and low salt diet.  Referals reviewed: NA Activities of Daily Living: Able to with mild limitations Any transportation issues/concerns: None Any patient concerns: None Confirmed importance & date/time of Follow up appt: Yes Confirmed with patient if condition begins to worsen call. Pt was given the office number and encouraged to call back with questions or concerns: Yes

## 2019-03-20 LAB — ECHOCARDIOGRAM COMPLETE
Height: 65 in
Weight: 2892.44 oz

## 2019-03-24 ENCOUNTER — Ambulatory Visit: Payer: Medicaid Other | Admitting: Cardiology

## 2019-03-24 ENCOUNTER — Other Ambulatory Visit: Payer: Self-pay

## 2019-03-25 ENCOUNTER — Encounter: Payer: Self-pay | Admitting: Cardiology

## 2019-03-25 ENCOUNTER — Ambulatory Visit (INDEPENDENT_AMBULATORY_CARE_PROVIDER_SITE_OTHER): Payer: Medicaid Other | Admitting: Cardiology

## 2019-03-25 VITALS — Ht 65.0 in | Wt 180.0 lb

## 2019-03-25 DIAGNOSIS — I10 Essential (primary) hypertension: Secondary | ICD-10-CM

## 2019-03-25 NOTE — Progress Notes (Signed)
   Telephone visit note  Subjective:   Darryl Hurley, male    DOB: 09-Jun-1961, 58 y.o.   MRN: [id number]   I connected with the patient on 03/25/19 by a telephone call and verified that I am speaking with the correct person using two identifiers.     I offered the patient a video enabled application for a virtual visit. Unfortunately, this could not be accomplished due to technical difficulties/lack of video enabled phone/computer. I discussed the limitations of evaluation and management by telemedicine and the availability of in person appointments. The patient expressed understanding and agreed to proceed.   This visit type was conducted due to national recommendations for restrictions regarding the COVID-19 Pandemic (e.g. social distancing).  This format is felt to be most appropriate for this patient at this time.  All issues noted in this document were discussed and addressed.  No physical exam was performed (except for noted visual exam findings with Tele health visits).  The patient has consented to conduct a Tele health visit and understands insurance will be billed.    Telepone visit was arranged for hospital follow up for chest pain. Patient continues to have daily, chest pain complaints lasting only for few seconds at a time. Recent hospital admission with similar complaints excluded myocardial infarction. Despite direct and specific questioning,  patient's limited ability to read the medications makes it very difficulty to reliably assess his medications and compliance. Patient did not have a blood pressure monitor. Due to lack of information, I did not think it was appropriate to continue this visit over the phone. I will arrange in office follow up visit.   Time spent: 9 min  Beautifull Cisar Emiliano Dyer, MD [facility] [place] Pager: [phone] Office: [phone] If no answer Cell [phone]

## 2019-03-31 ENCOUNTER — Ambulatory Visit: Payer: Self-pay | Admitting: Cardiology

## 2019-04-02 ENCOUNTER — Ambulatory Visit: Payer: Medicaid Other | Admitting: Cardiology

## 2019-04-02 NOTE — Progress Notes (Deleted)
Virtual Visit via Video Note   Subjective:   Darryl Hurley, male    DOB: 26-Dec-1960, 58 y.o.   MRN: [id number]   I connected with the patient on ***04/02/19 by a video enabled telemedicine application and verified that I am speaking with the correct person using two identifiers.     I discussed the limitations of evaluation and management by telemedicine and the availability of in person appointments. The patient expressed understanding and agreed to proceed.   This visit type was conducted due to national recommendations for restrictions regarding the COVID-19 Pandemic (e.g. social distancing).  This format is felt to be most appropriate for this patient at this time.  All issues noted in this document were discussed and addressed.  No physical exam was performed (except for noted visual exam findings with Tele health visits).  The patient has consented to conduct a Tele health visit and understands insurance will be billed.     Chief complaint:  ***  *** HPI  64 -year-old African-American male with hypertension, type II diabetes mellitus, tobaccoand alcoholabuse, gout, midly reduced LVEF and mildly abnormal stress test (01/2018).  Patient presented to the ED on 04/14/020 with intermittent left sided sharp chest pain, associated with shortness of breath since yesterday 03/16/2019. Given radiation of chest poin to back, he underwent CTA looking for dissection. No dissection was seen. He wassupposed to beon Xarelto for DVT, but had been out of it for many days.. Of note, D-dimer was also elevated in 2010 with CTA negative for PE. S is negative for DVT.  I discontinued his Xarelto, which he was not taking anyway.  While his initial trop in the ED was elevated at 0.51, two subsequent troponins wee <0.03.  Echocardiogram was unchanged from baseline. To summarize, I felt his chest pain is of musculoskeletal etiology with no definitive pathology noted.   His telephone visit was  inadequate, as he was not able to reliably provide information regarding his medications. He was unable to come for in office visit today, due to lack of public transport. I thus requested him to assemble all his medications, and discuss with me in a video visit.  *** Past Medical History:  Diagnosis Date  . Alcohol abuse   . Angina pectoris   . Arthritis   . Asthma   . Chronic lower back pain   . COPD (chronic obstructive pulmonary disease) (HCC)    on home O2 2L  . Diabetes mellitus without complication (HCC)   . Exertional shortness of breath   . Gout   . Hypertension   . Seizures (HCC)     *** Past Surgical History:  Procedure Laterality Date  . NO PAST SURGERIES      *** Social History   Socioeconomic History  . Marital status: Single    Spouse name: Not on file  . Number of children: 2  . Years of education: 11th  . Highest education level: Not on file  Occupational History    Employer: OTHER    Comment: disability  Social Needs  . Financial resource strain: Not on file  . Food insecurity:    Worry: Not on file    Inability: Not on file  . Transportation needs:    Medical: Not on file    Non-medical: Not on file  Tobacco Use  . Smoking status: Current Every Day Smoker    Packs/day: 0.25    Years: 40.00    Pack years: 10.00  Types: Cigarettes  . Smokeless tobacco: Never Used  . Tobacco comment: 05/12/2013 'eased off smoking since last month"  Substance and Sexual Activity  . Alcohol use: Yes    Comment: 4 40 oz beers per day, 1 pint of liqor in a day  . Drug use: No    Types: "Crack" cocaine  . Sexual activity: Never  Lifestyle  . Physical activity:    Days per week: Not on file    Minutes per session: Not on file  . Stress: Not on file  Relationships  . Social connections:    Talks on phone: Not on file    Gets together: Not on file    Attends religious service: Not on file    Active member of club or organization: Not on file    Attends  meetings of clubs or organizations: Not on file    Relationship status: Not on file  . Intimate partner violence:    Fear of current or ex partner: Not on file    Emotionally abused: Not on file    Physically abused: Not on file    Forced sexual activity: Not on file  Other Topics Concern  . Not on file  Social History Narrative   Patient lives at home with friend.   Caffeine Use: none    *** Family History  Problem Relation Age of Onset  . Diabetes type II Mother   . Depression Father   . Suicidality Father   . Asthma Brother     *** Current Outpatient Medications on File Prior to Visit  Medication Sig Dispense Refill  . albuterol (PROVENTIL HFA;VENTOLIN HFA) 108 (90 BASE) MCG/ACT inhaler Inhale 1 puff into the lungs every 6 (six) hours as needed for wheezing or shortness of breath.    . allopurinol (ZYLOPRIM) 100 MG tablet Take 100 mg by mouth daily.    Marland Kitchen amLODipine (NORVASC) 10 MG tablet Take 10 mg by mouth daily.  2  . aspirin EC 81 MG EC tablet Take 1 tablet (81 mg total) by mouth daily. 90 tablet 3  . cloNIDine (CATAPRES) 0.1 MG tablet Take 0.1 mg by mouth 2 (two) times daily.  3  . FLOVENT HFA 110 MCG/ACT inhaler Inhale 1 puff into the lungs 2 (two) times daily as needed (shortness of breath).   3  . gabapentin (NEURONTIN) 300 MG capsule Take 300 mg by mouth 2 (two) times daily.    . hydrochlorothiazide (HYDRODIURIL) 25 MG tablet Take 25 mg by mouth daily.   2  . labetalol (NORMODYNE) 200 MG tablet Take 1 tablet (200 mg total) by mouth 2 (two) times daily. (Patient not taking: Reported on 03/25/2019) 60 tablet 0  . lisinopril (ZESTRIL) 20 MG tablet Take 20 mg by mouth daily.    Marland Kitchen losartan (COZAAR) 100 MG tablet Take 1 tablet (100 mg total) by mouth daily. (Patient not taking: Reported on 03/25/2019) 30 tablet 0  . metFORMIN (GLUCOPHAGE) 1000 MG tablet Take 1 tablet (1,000 mg total) by mouth 2 (two) times daily with a meal. 60 tablet 0  . nitroGLYCERIN (NITROSTAT) 0.4 MG SL  tablet Place 1 tablet (0.4 mg total) under the tongue every 5 (five) minutes x 3 doses as needed for chest pain. 30 tablet 3  . oxyCODONE-acetaminophen (PERCOCET/ROXICET) 5-325 MG tablet Take 1-2 tablets by mouth every 6 (six) hours as needed for severe pain. (Patient not taking: Reported on 03/25/2019) 6 tablet 0  . rosuvastatin (CRESTOR) 40 MG tablet Take 40 mg  by mouth daily.    Marland Kitchen. tiZANidine (ZANAFLEX) 4 MG capsule Take 4 mg by mouth as needed for muscle spasms.   1   No current facility-administered medications on file prior to visit.     Cardiovascular studies:  ***  DVT US 03/04/2019: Right: There is no evidence of deep vein thrombosis in the lower extremity. No cystic structure found in the popliteal fossa. Left: There is no evidence of deep vein thrombosis in the lower extremity. No cystic structure found in the popliteal fossa.  Echocardiogram 03/18/2019:  1. The left ventricle has mildly reduced systolic function, with an ejection fraction of 45-50%. The cavity size was normal. There is mild concentric left ventricular hypertrophy. Left ventricular diastolic Doppler parameters are consistent with  impaired relaxation.  2. No significant valvular abnormality.  3. No significant change compared to previous study on 02/12/2018.  Cardiovascular studies:  RLE venous duplex US 09/2018: Final Interpretation: Right: Findings consistent with acute deep vein thrombosis involving the right peroneal vein, and right posterior tibial vein. No cystic structure found in the popliteal fossa. Left: No evidence of common femoral vein obstruction   Echocardiogram 02/11/2018: Left ventricle cavity is normal in size. Mild concentric hypertrophy of the left ventricle. Mild decrease in global wall motion. Visual EF is 45-50%. Doppler evidence of grade I (impaired) diastolic dysfunction, normal LAP. Calculated EF 45%. Left atrial cavity is mildly dilated. Inadequate tricuspid regurgitation jet to  estimate pulmonary artery pressure IVC is dilated with blunted respiratory response. Estimated RA pressure 10-15 mmHg.  Lexiscan myoview stress test 01/31/2018:  1. Pharmacologic stress testing was performed with intravenous administration of .4 mg of Lexiscan over a 10-15 seconds infusion. Patient was hypertensive throughout the study, peak BP 200/120 mmHg. Stress symptoms included dyspnea, dizziness.  2. Exercise capacity not assessed. Stress EKG is non diagnostic for ischemia as it is a pharmacologic stress.  3. The overall quality of the study is good. Left ventricular cavity is noted to be normal on the rest and stress studies. Gated SPECT images reveal normal myocardial thickening and wall motion. The left ventricular ejection fraction was calculated or visually estimated to be 41%. SPECT images demonstrate small perfusion abnormality of mild intensity in the mid inferolateral myocardial wall(s) on the stress images with mild reversibility on rest images.  4. Low to intermediate risk study.  Recent labs: ***  Review of Systems  Constitution: Negative for decreased appetite, malaise/fatigue, weight gain and weight loss.  HENT: Negative for congestion.   Eyes: Negative for visual disturbance.  Cardiovascular: Negative for chest pain, dyspnea on exertion, leg swelling, palpitations and syncope.  Respiratory: Negative for shortness of breath.   Endocrine: Negative for cold intolerance.  Hematologic/Lymphatic: Does not bruise/bleed easily.  Skin: Negative for itching and rash.  Musculoskeletal: Negative for myalgias.  Gastrointestinal: Negative for abdominal pain, nausea and vomiting.  Genitourinary: Negative for dysuria.  Neurological: Negative for dizziness and weakness.  Psychiatric/Behavioral: The patient is not nervous/anxious.   All other systems reviewed and are negative.       *** There were no vitals filed for this visit. (Measured by the patient using a home BP  monitor)   Observation/findings during video visit   Objective:    Physical Exam  Constitutional: He is oriented to person, place, and time. He appears well-developed and well-nourished. No distress.  Pulmonary/Chest: Effort normal.  Neurological: He is alert and oriented to person, place, and time.  Psychiatric: He has a normal mood and affect.  Nursing  note and vitals reviewed.         Assessment & Recommendations:   ***  ***   Uilani Sanville Emiliano Dyer, MD [facility] Pager: [phone] Office: [phone] If no answer Cell [phone]

## 2019-04-21 ENCOUNTER — Other Ambulatory Visit: Payer: Self-pay

## 2019-04-21 ENCOUNTER — Emergency Department (HOSPITAL_COMMUNITY)
Admission: EM | Admit: 2019-04-21 | Discharge: 2019-04-22 | Disposition: A | Discharge status: Home / Self Care | Payer: Medicaid Other | Attending: Emergency Medicine | Admitting: Emergency Medicine

## 2019-04-21 ENCOUNTER — Emergency Department (HOSPITAL_COMMUNITY): Payer: Medicaid Other

## 2019-04-21 DIAGNOSIS — R0789 Other chest pain: Secondary | ICD-10-CM | POA: Diagnosis not present

## 2019-04-21 DIAGNOSIS — J449 Chronic obstructive pulmonary disease, unspecified: Secondary | ICD-10-CM | POA: Insufficient documentation

## 2019-04-21 DIAGNOSIS — Z7982 Long term (current) use of aspirin: Secondary | ICD-10-CM | POA: Insufficient documentation

## 2019-04-21 DIAGNOSIS — Y999 Unspecified external cause status: Secondary | ICD-10-CM | POA: Diagnosis not present

## 2019-04-21 DIAGNOSIS — Z79899 Other long term (current) drug therapy: Secondary | ICD-10-CM | POA: Insufficient documentation

## 2019-04-21 DIAGNOSIS — X58XXXA Exposure to other specified factors, initial encounter: Secondary | ICD-10-CM | POA: Diagnosis not present

## 2019-04-21 DIAGNOSIS — F1490 Cocaine use, unspecified, uncomplicated: Secondary | ICD-10-CM | POA: Insufficient documentation

## 2019-04-21 DIAGNOSIS — Y929 Unspecified place or not applicable: Secondary | ICD-10-CM | POA: Insufficient documentation

## 2019-04-21 DIAGNOSIS — S20211A Contusion of right front wall of thorax, initial encounter: Secondary | ICD-10-CM | POA: Insufficient documentation

## 2019-04-21 DIAGNOSIS — R0781 Pleurodynia: Secondary | ICD-10-CM

## 2019-04-21 DIAGNOSIS — F17210 Nicotine dependence, cigarettes, uncomplicated: Secondary | ICD-10-CM | POA: Insufficient documentation

## 2019-04-21 DIAGNOSIS — Y906 Blood alcohol level of 120-199 mg/100 ml: Secondary | ICD-10-CM | POA: Insufficient documentation

## 2019-04-21 DIAGNOSIS — F10920 Alcohol use, unspecified with intoxication, uncomplicated: Secondary | ICD-10-CM

## 2019-04-21 DIAGNOSIS — Z7984 Long term (current) use of oral hypoglycemic drugs: Secondary | ICD-10-CM | POA: Insufficient documentation

## 2019-04-21 DIAGNOSIS — E119 Type 2 diabetes mellitus without complications: Secondary | ICD-10-CM | POA: Diagnosis not present

## 2019-04-21 DIAGNOSIS — Y939 Activity, unspecified: Secondary | ICD-10-CM | POA: Insufficient documentation
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
## 2019-04-21 NOTE — ED Notes (Signed)
[phone] Mrs. Kimber pts wife, call when a pt is available

## 2019-04-22 ENCOUNTER — Emergency Department (HOSPITAL_COMMUNITY): Payer: Medicaid Other

## 2019-04-22 LAB — CBC WITH DIFFERENTIAL/PLATELET
Abs Immature Granulocytes: 0.02 10*3/uL (ref 0.00–0.07)
Basophils Absolute: 0.1 10*3/uL (ref 0.0–0.1)
Basophils Relative: 1 %
Eosinophils Absolute: 0.1 10*3/uL (ref 0.0–0.5)
Eosinophils Relative: 2 %
HCT: 41 % (ref 39.0–52.0)
Hemoglobin: 13.5 g/dL (ref 13.0–17.0)
Immature Granulocytes: 0 %
Lymphocytes Relative: 31 %
Lymphs Abs: 1.9 10*3/uL (ref 0.7–4.0)
MCH: 29.3 pg (ref 26.0–34.0)
MCHC: 32.9 g/dL (ref 30.0–36.0)
MCV: 89.1 fL (ref 80.0–100.0)
Monocytes Absolute: 0.5 10*3/uL (ref 0.1–1.0)
Monocytes Relative: 8 %
Neutro Abs: 3.6 10*3/uL (ref 1.7–7.7)
Neutrophils Relative %: 58 %
Platelets: 382 10*3/uL (ref 150–400)
RBC: 4.6 MIL/uL (ref 4.22–5.81)
RDW: 15.6 % — ABNORMAL HIGH (ref 11.5–15.5)
WBC: 6.1 10*3/uL (ref 4.0–10.5)
nRBC: 0 % (ref 0.0–0.2)

## 2019-04-22 LAB — COMPREHENSIVE METABOLIC PANEL
ALT: 24 U/L (ref 0–44)
AST: 44 U/L — ABNORMAL HIGH (ref 15–41)
Albumin: 3.3 g/dL — ABNORMAL LOW (ref 3.5–5.0)
Alkaline Phosphatase: 47 U/L (ref 38–126)
Anion gap: 13 (ref 5–15)
BUN: 18 mg/dL (ref 6–20)
CO2: 22 mmol/L (ref 22–32)
Calcium: 9.3 mg/dL (ref 8.9–10.3)
Chloride: 107 mmol/L (ref 98–111)
Creatinine, Ser: 1.16 mg/dL (ref 0.61–1.24)
GFR calc Af Amer: >60 mL/min (ref >60)
GFR calc non Af Amer: >60 mL/min (ref >60)
Glucose, Bld: 99 mg/dL (ref 70–99)
Potassium: 3.8 mmol/L (ref 3.5–5.1)
Sodium: 142 mmol/L (ref 135–145)
Total Bilirubin: 0.4 mg/dL (ref 0.3–1.2)
Total Protein: 7.3 g/dL (ref 6.5–8.1)

## 2019-04-22 LAB — RAPID URINE DRUG SCREEN, HOSP PERFORMED
Amphetamines: NOT DETECTED
Barbiturates: NOT DETECTED
Benzodiazepines: NOT DETECTED
Cocaine: POSITIVE — AB
Opiates: NOT DETECTED
Tetrahydrocannabinol: NOT DETECTED

## 2019-04-22 LAB — TROPONIN I: Troponin I: <0.03 ng/mL (ref <0.03)

## 2019-04-22 LAB — ETHANOL: Alcohol, Ethyl (B): 184 mg/dL — ABNORMAL HIGH (ref <10)

## 2019-04-22 IMAGING — CR RIGHT RIBS - 2 VIEW
2 series · 2 of 2 positions shown · non-contrast
Comparison: Chest radiographs [DATE] and earlier.

CLINICAL DATA: 57-year-old male with rib pain. Questionable
seizure.

EXAM:
RIGHT RIBS - 2 VIEW

[rib pa]
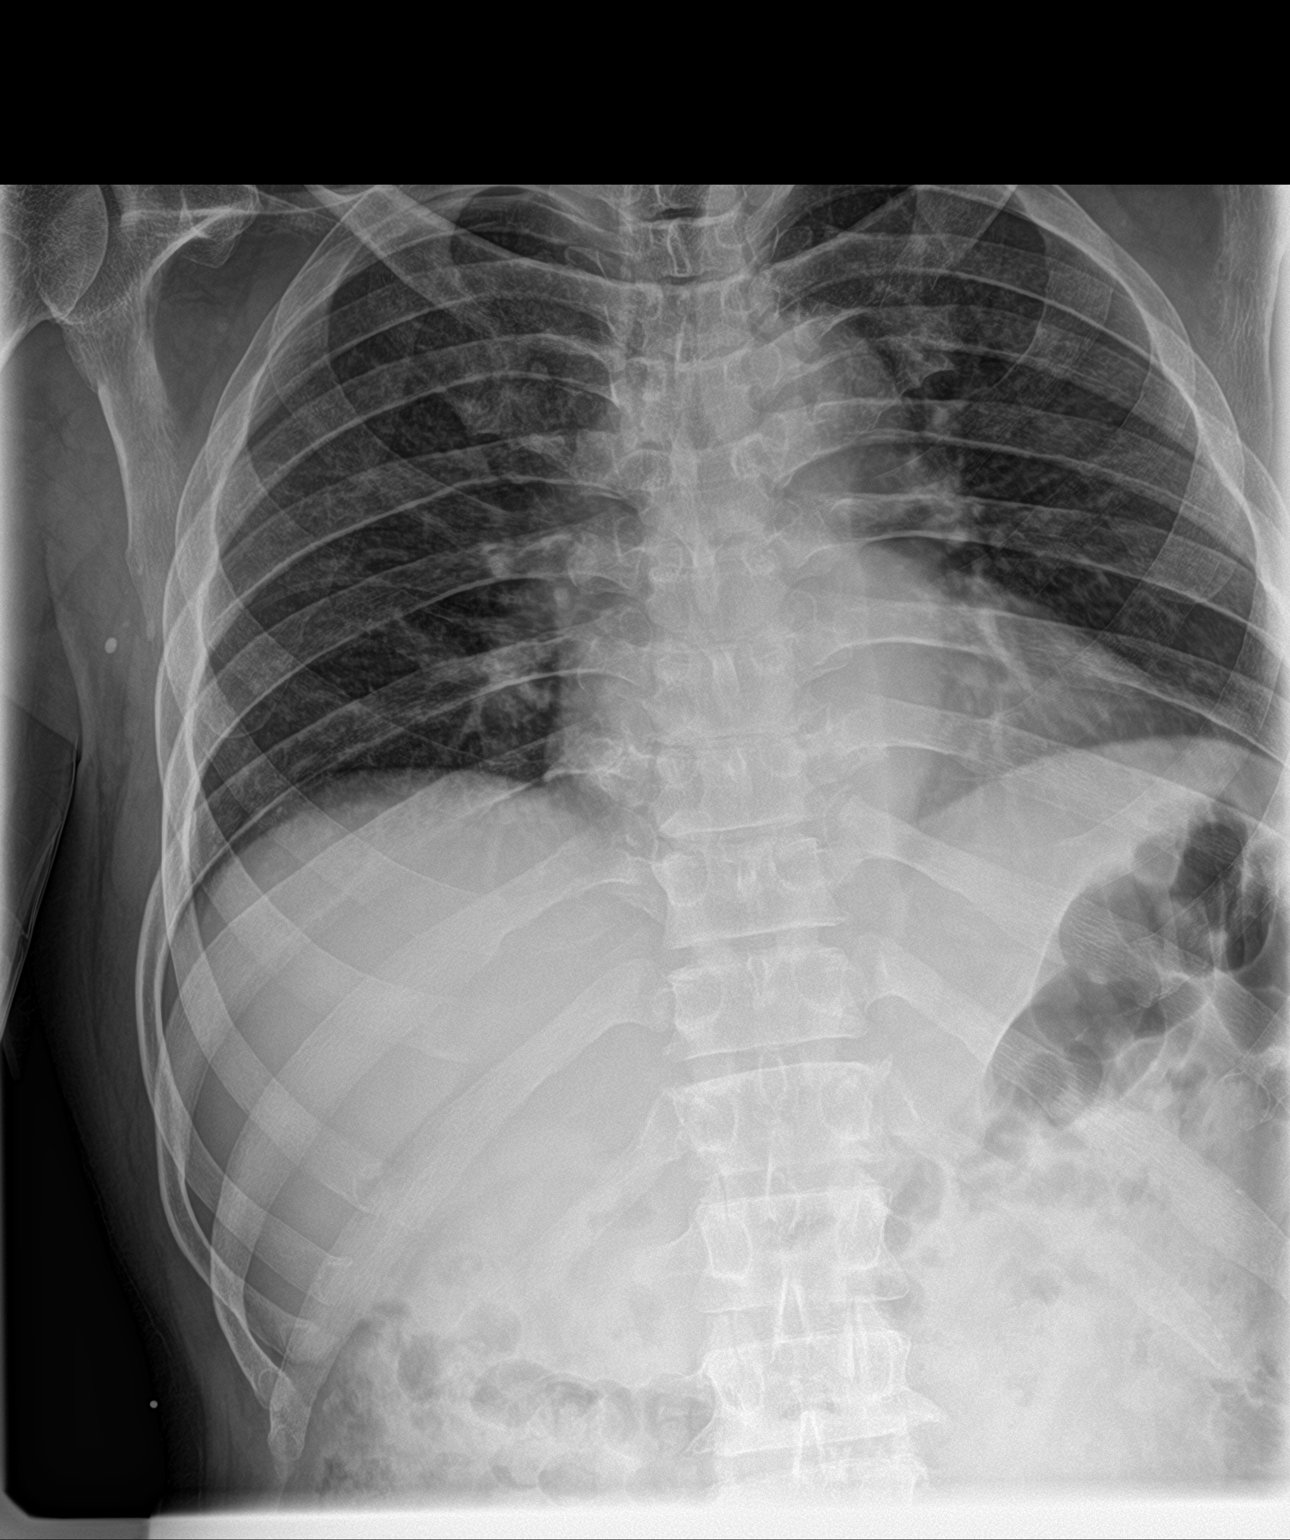

[rib pa obl]
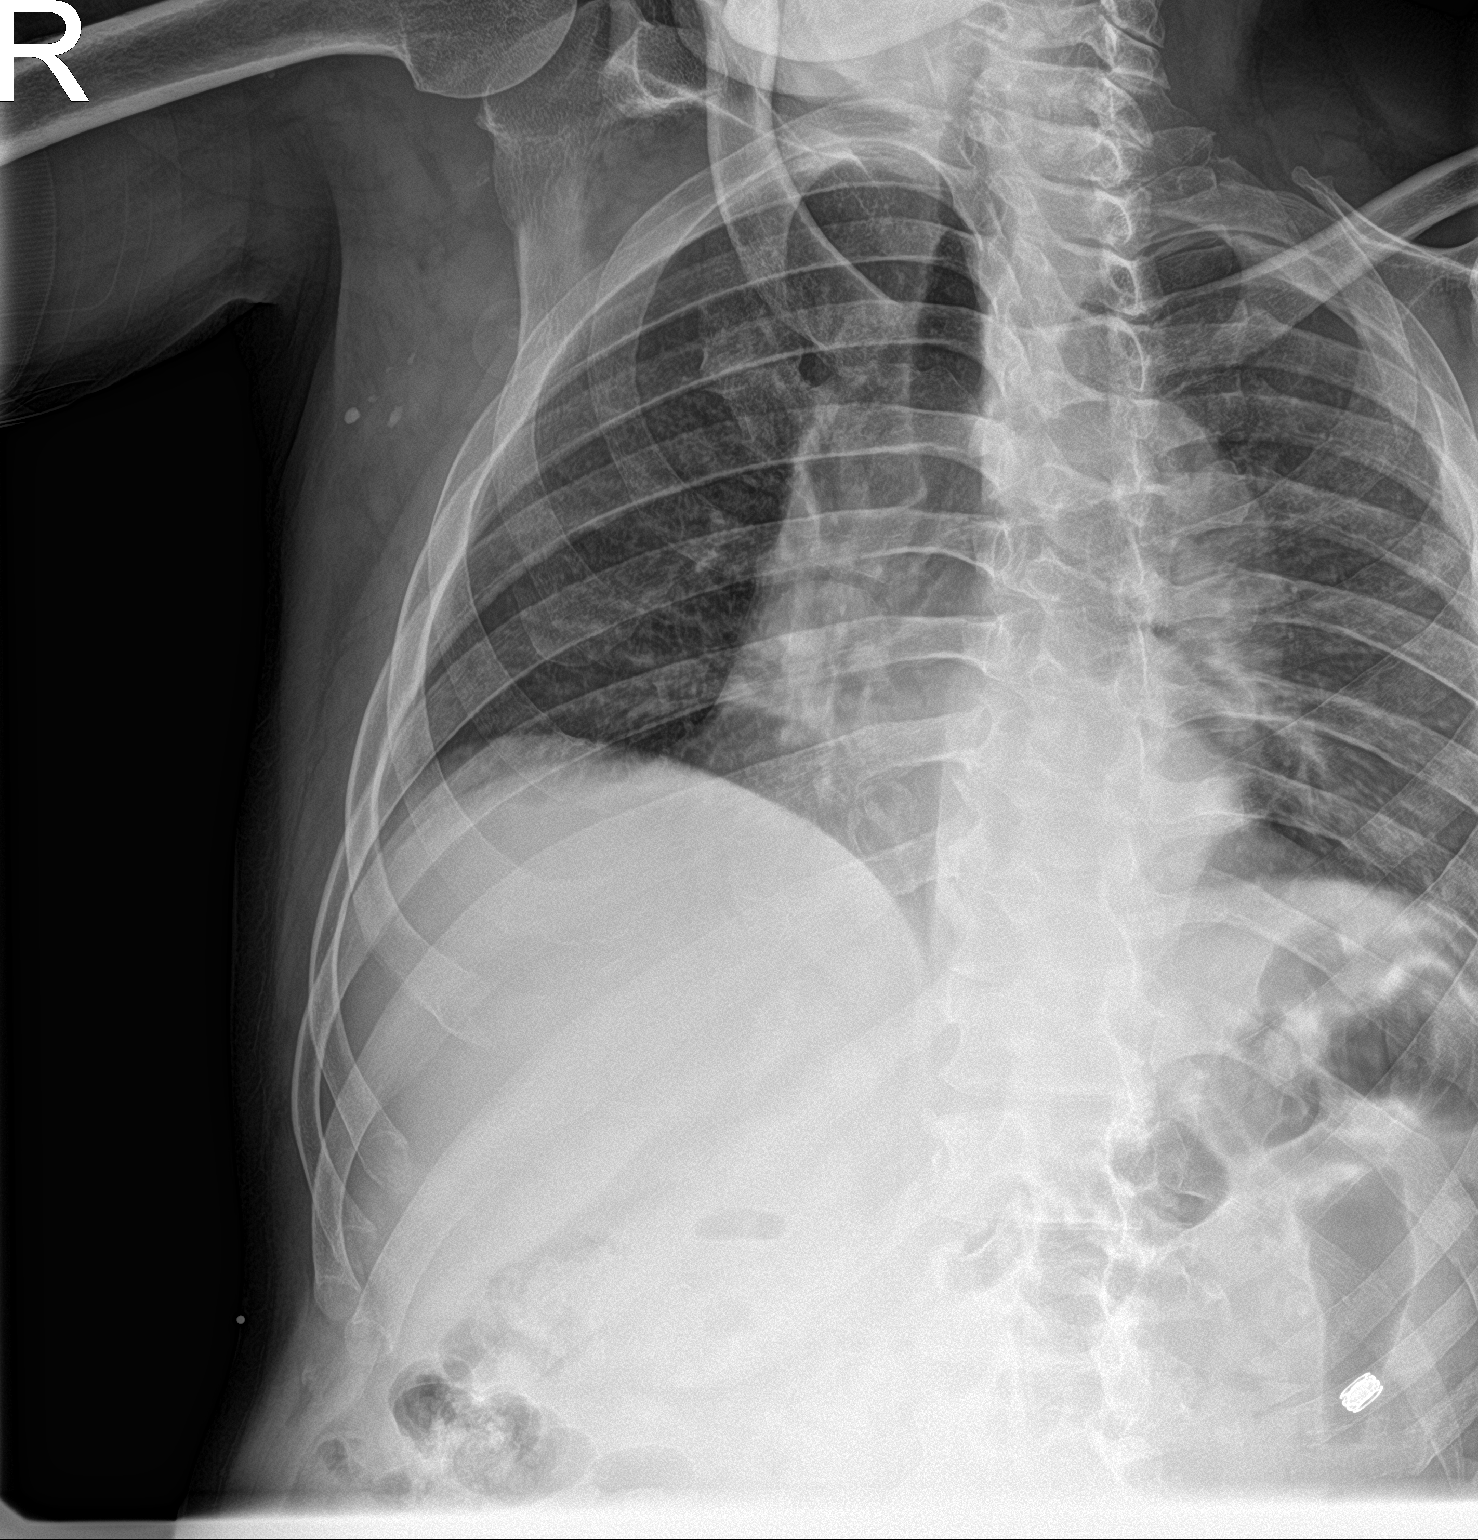

[2 of 2 positions shown; findings below may reference images not displayed]

FINDINGS: Two oblique views of the right ribs. A rib marker is at the
anterolateral right 9th, 10th rib level. No rib fracture or rib
lesion identified. Bone mineralization is within normal limits.
Other visible osseous structures appear intact. Negative visible
bowel gas pattern. Continued low lung volumes. Stable ventilation.
IMPRESSION: No right rib fracture or acute finding identified.

## 2019-04-22 NOTE — ED Notes (Signed)
Patient transported to X-ray 
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
## 2019-04-22 NOTE — ED Notes (Signed)
Patient verbalizes understanding of discharge instructions. Opportunity for questioning and answers were provided. Armband removed by staff, pt discharged from ED ambulatory.   

## 2019-05-20 ENCOUNTER — Other Ambulatory Visit: Payer: Self-pay

## 2019-05-20 ENCOUNTER — Encounter (HOSPITAL_COMMUNITY): Payer: Self-pay | Admitting: Emergency Medicine

## 2019-05-20 ENCOUNTER — Ambulatory Visit (HOSPITAL_COMMUNITY)
Admission: EM | Admit: 2019-05-20 | Discharge: 2019-05-20 | Disposition: A | Discharge status: Home / Self Care | Payer: Medicaid Other | Attending: Internal Medicine | Admitting: Internal Medicine

## 2019-05-20 DIAGNOSIS — L739 Follicular disorder, unspecified: Secondary | ICD-10-CM

## 2019-05-20 DIAGNOSIS — I10 Essential (primary) hypertension: Secondary | ICD-10-CM

## 2019-05-20 DIAGNOSIS — F10982 Alcohol use, unspecified with alcohol-induced sleep disorder: Secondary | ICD-10-CM

## 2019-05-20 MED ORDER — TRAZODONE HCL 50 MG PO TABS: 50.0000 mg | ORAL_TABLET | Freq: Every evening | ORAL | 0 refills | Status: DC | PRN

## 2019-05-20 MED ORDER — ACETAMINOPHEN 325 MG PO TABS: 650.0000 mg | ORAL_TABLET | Freq: Four times a day (QID) | ORAL | Status: AC | PRN

## 2019-05-20 NOTE — ED Provider Notes (Signed)
[facility]    CSN: [id number] Arrival date & time: 05/20/19  [id number]      History   Chief Complaint Chief Complaint  Patient presents with  . Neck Pain    HPI Darryl Hurley is a 58 y.o. male with history of COPD-controlled, diabetes mellitus type 2-controlled with metformin comes to urgent care complaints of neck pain of 3 days duration.  Patient says symptoms started insidiously and is gotten progressively worse.  Patient noted a small lump around the hairline in the exterior aspect of the neck.  Patient describes this as the site of pain.  Pain is severe.  Patient however looks comfortable.  Pain is worsened with palpation.  No known relieving factors.  No numbness or tingling.  No discharge from the lesion.  Patient also complains of inability to sleep.  He was a heavy drinker and is cut back to about 2 beers a day.  Since that time he has been having difficulty sleeping at night.  Past Medical History:  Diagnosis Date  . Alcohol abuse   . Angina pectoris   . Arthritis   . Asthma   . Chronic lower back pain   . COPD (chronic obstructive pulmonary disease) (HCC)    on home O2 2L  . Diabetes mellitus without complication (HCC)   . Exertional shortness of breath   . Gout   . Hypertension   . Seizures [facility](HCC)     Patient Active Problem List   Diagnosis Date Noted  . Lactic acidosis 01/06/2016  . Lactic acid acidosis 01/06/2016  . Alcohol abuse with intoxication (HCC) 01/06/2016  . COPD with acute exacerbation (HCC) 02/02/2014  . HCAP (healthcare-associated pneumonia) 02/02/2014  . Increased anion gap metabolic acidosis 02/02/2014  . Cocaine abuse (HCC) 11/18/2013  . Fever 11/17/2013  . Anemia 11/17/2013  . Hypokalemia 11/17/2013  . Polyarthritis 11/17/2013  . Dehydration 11/17/2013  . Type 2 diabetes mellitus (HCC) 05/14/2013  . Chronic obstructive pulmonary disease (COPD) (HCC) 05/12/2013  . Gout 05/12/2013  . Headache 05/12/2013  . Asthma 05/04/2013  .  TOBACCO ABUSE 04/27/2009  . Uncontrolled hypertension 04/27/2009    Past Surgical History:  Procedure Laterality Date  . NO PAST SURGERIES         Home Medications    Prior to Admission medications   Medication Sig Start Date End Date Taking? Authorizing Provider  acetaminophen (TYLENOL) 325 MG tablet Take 2 tablets (650 mg total) by mouth every 6 (six) hours as needed. 05/20/19   , Britta MccreedyPhilip O, MD  albuterol (PROVENTIL HFA;VENTOLIN HFA) 108 (90 BASE) MCG/ACT inhaler Inhale 1 puff into the lungs every 6 (six) hours as needed for wheezing or shortness of breath.    [provider]  allopurinol (ZYLOPRIM) 100 MG tablet Take 100 mg by mouth daily.    [provider]  amLODipine (NORVASC) 10 MG tablet Take 10 mg by mouth daily. 08/06/18   [provider]  aspirin EC 81 MG EC tablet Take 1 tablet (81 mg total) by mouth daily. 03/19/19   Patwardhan, Anabel BeneManish J, MD  cloNIDine (CATAPRES) 0.1 MG tablet Take 0.1 mg by mouth 2 (two) times daily. 08/15/18   [provider]  FLOVENT HFA 110 MCG/ACT inhaler Inhale 1 puff into the lungs 2 (two) times daily as needed (shortness of breath).  08/06/18   [provider]  gabapentin (NEURONTIN) 300 MG capsule Take 300 mg by mouth 2 (two) times daily. 03/04/19   [provider]  hydrochlorothiazide (HYDRODIURIL) 25 MG tablet Take 25 mg by mouth daily.  07/14/18   [provider]  lisinopril (ZESTRIL) 20 MG tablet Take 20 mg by mouth daily.    [provider]  metFORMIN (GLUCOPHAGE) 1000 MG tablet Take 1 tablet (1,000 mg total) by mouth 2 (two) times daily with a meal. 01/08/16   Alison Murrayevine, Alma M, MD  nitroGLYCERIN (NITROSTAT) 0.4 MG SL tablet Place 1 tablet (0.4 mg total) under the tongue every 5 (five) minutes x 3 doses as needed for chest pain. 03/18/19   Patwardhan, Anabel BeneManish J, MD  rosuvastatin (CRESTOR) 40 MG tablet Take 40 mg by mouth daily.    [provider]  tiZANidine (ZANAFLEX) 4 MG  capsule Take 4 mg by mouth as needed for muscle spasms.  06/25/18   [provider]  traZODone (DESYREL) 50 MG tablet Take 1 tablet (50 mg total) by mouth at bedtime as needed for sleep. 05/20/19   , Britta MccreedyPhilip O, MD  labetalol (NORMODYNE) 200 MG tablet Take 1 tablet (200 mg total) by mouth 2 (two) times daily. Patient not taking: Reported on 03/25/2019 02/16/19 05/20/19  Elder NegusPatwardhan, Manish J, MD  losartan (COZAAR) 100 MG tablet Take 1 tablet (100 mg total) by mouth daily. Patient not taking: Reported on 03/25/2019 12/24/17 05/20/19  Street, EdisonMercedes, PA-C    Family History Family History  Problem Relation Age of Onset  . Diabetes type II Mother   . Depression Father   . Suicidality Father   . Asthma Brother     Social History Social History   Tobacco Use  . Smoking status: Current Every Day Smoker    Packs/day: 0.25    Years: 40.00    Pack years: 10.00    Types: Cigarettes  . Smokeless tobacco: Never Used  . Tobacco comment: 05/12/2013 'eased off smoking since last month"  Substance Use Topics  . Alcohol use: Yes    Comment: 4 40 oz beers per day, 1 pint of liqor in a day  . Drug use: No    Types: "Crack" cocaine     Allergies   Nsaids and Toradol [ketorolac tromethamine]   Review of Systems Review of Systems  Constitutional: Negative.  Negative for activity change, appetite change and fatigue.  Respiratory: Negative.   Gastrointestinal: Negative.   Musculoskeletal: Negative.   Neurological: Negative for dizziness, tremors, weakness, light-headedness and numbness.  Psychiatric/Behavioral: Negative for agitation, behavioral problems, confusion and decreased concentration. The patient is not hyperactive.   All other systems reviewed and are negative.    Physical Exam Triage Vital Signs ED Triage Vitals  Enc Vitals Group     BP 05/20/19 1000 (!) 162/107     Pulse Rate 05/20/19 1000 86     Resp 05/20/19 1000 16     Temp 05/20/19 1000 98.6 F (37 C)      Temp Source 05/20/19 1000 Oral     SpO2 05/20/19 1000 97 %     Weight --      Height --      Head Circumference --      Peak Flow --      Pain Score 05/20/19 1006 8     Pain Loc --      Pain Edu? --      Excl. in GC? --    No data found.  Updated Vital Signs BP (!) 162/107 (BP Location: Right Arm) Comment: RN notified of critical vital signs. SP   Pulse 86   Temp  98.6 F (37 C) (Oral)   Resp 16   SpO2 97%   Visual Acuity Right Eye Distance:   Left Eye Distance:   Bilateral Distance:    Right Eye Near:   Left Eye Near:    Bilateral Near:     Physical Exam Constitutional:      Appearance: Normal appearance.  HENT:     Mouth/Throat:     Mouth: Mucous membranes are moist.     Pharynx: Oropharynx is clear.  Eyes:     Conjunctiva/sclera: Conjunctivae normal.  Cardiovascular:     Rate and Rhythm: Normal rate and regular rhythm.  Abdominal:     General: Abdomen is flat. Bowel sounds are normal.     Palpations: Abdomen is soft.  Skin:    General: Skin is warm and dry.     Capillary Refill: Capillary refill takes less than 2 seconds.     Comments: Cystic lesion over the posterior aspect of the neck in the region of the hairline.  Lesion is tender, freely mobile, no erythema, discharge.  Neurological:     General: No focal deficit present.     Mental Status: He is alert and oriented to person, place, and time.  Psychiatric:        Mood and Affect: Mood normal.        Behavior: Behavior normal.      UC Treatments / Results  Labs (all labs ordered are listed, but only abnormal results are displayed) Labs Reviewed - No data to display  EKG None  Radiology No results found.  Procedures Procedures (including critical care time)  Medications Ordered in UC Medications - No data to display  Initial Impression / Assessment and Plan / UC Course  I have reviewed the triage vital signs and the nursing notes.  Pertinent labs & imaging results that were available  during my care of the patient were reviewed by me and considered in my medical decision making (see chart for details).    1.  Epidermal cyst likely related to hair follicle cyst: There are no signs of infection hence no indication for antibiotics Patient is advised to use over-the-counter pain medications. Should patient notice enlargement of the lesion, erythema or purulent discharge, patient needs to return to urgent care to be reevaluated.  2.  Alcohol induced insomnia: Trazodone 50 mg nightly as needed for insomnia.  Alcohol cessation is advised.  Patient is actively trying to cut back on much alcohol he consumes.  I commended him for that.  Final Clinical Impressions(s) / UC Diagnoses   Final diagnoses:  Folliculitis  Alcohol-induced insomnia (HCC)   Discharge Instructions   None    ED Prescriptions    Medication Sig Dispense Auth. Provider   acetaminophen (TYLENOL) 325 MG tablet Take 2 tablets (650 mg total) by mouth every 6 (six) hours as needed.  Chase Picket, MD   traZODone (DESYREL) 50 MG tablet Take 1 tablet (50 mg total) by mouth at bedtime as needed for sleep. 30 tablet , Myrene Galas, MD     Controlled Substance Prescriptions [place] Controlled Substance Registry consulted? No   Chase Picket, MD 05/21/19 [phone]

## 2019-05-20 NOTE — ED Triage Notes (Signed)
Pt here for neck pain x 3 days  

## 2019-08-07 ENCOUNTER — Other Ambulatory Visit: Payer: Self-pay

## 2019-08-07 ENCOUNTER — Encounter: Payer: Self-pay | Admitting: Cardiology

## 2019-08-07 ENCOUNTER — Ambulatory Visit: Payer: Medicaid Other | Admitting: Cardiology

## 2019-08-07 VITALS — BP 163/108 | HR 105 | Ht 65.0 in | Wt 163.0 lb

## 2019-08-07 DIAGNOSIS — I10 Essential (primary) hypertension: Secondary | ICD-10-CM | POA: Diagnosis not present

## 2019-08-07 NOTE — Progress Notes (Signed)
Patient is here for follow up visit.  Subjective:   Darryl Hurley, male    DOB: 1961/04/23, 58 y.o.   MRN: [id number]   Chief Complaint  Patient presents with  . Hypertension   58 -year-old African-American male with hypertension, type II diabetes mellitus, tobacco abuse, gout, midly reduced LVEF and mildly abnormal stress test (01/2018), h/o DVT.  Patient has had frequent ED visits with chest pain with workup negative for acute MI. Blood pressure remains elevated. Today, he brings with him multiple bottles of medications with duplications, as well as monthly packs provided by his pharmacy. He is very confused about his medications, and unfortunately cannot read. This makes compliance even more difficult.  He denies any chest pain, shortness of breath.   Past Medical History:  Diagnosis Date  . Alcohol abuse   . Angina pectoris   . Arthritis   . Asthma   . Chronic lower back pain   . COPD (chronic obstructive pulmonary disease) (HCC)    on home O2 2L  . Diabetes mellitus without complication (HCC)   . Exertional shortness of breath   . Gout   . Hypertension   . Seizures (HCC)      Past Surgical History:  Procedure Laterality Date  . NO PAST SURGERIES       Social History   Socioeconomic History  . Marital status: Single    Spouse name: Not on file  . Number of children: 2  . Years of education: 11th  . Highest education level: Not on file  Occupational History    Employer: OTHER    Comment: disability  Social Needs  . Financial resource strain: Not on file  . Food insecurity    Worry: Not on file    Inability: Not on file  . Transportation needs    Medical: Not on file    Non-medical: Not on file  Tobacco Use  . Smoking status: Current Every Day Smoker    Packs/day: 0.25    Years: 40.00    Pack years: 10.00    Types: Cigarettes  . Smokeless tobacco: Never Used  . Tobacco comment: 05/12/2013 'eased off smoking since last month"  Substance and  Sexual Activity  . Alcohol use: Yes    Comment: 4 40 oz beers per day, 1 pint of liqor in a day  . Drug use: No    Types: "Crack" cocaine  . Sexual activity: Never  Lifestyle  . Physical activity    Days per week: Not on file    Minutes per session: Not on file  . Stress: Not on file  Relationships  . Social Musicianconnections    Talks on phone: Not on file    Gets together: Not on file    Attends religious service: Not on file    Active member of club or organization: Not on file    Attends meetings of clubs or organizations: Not on file    Relationship status: Not on file  . Intimate partner violence    Fear of current or ex partner: Not on file    Emotionally abused: Not on file    Physically abused: Not on file    Forced sexual activity: Not on file  Other Topics Concern  . Not on file  Social History Narrative   Patient lives at home with friend.   Caffeine Use: none     Current Outpatient Medications on File Prior to Visit  Medication Sig Dispense Refill  .  acetaminophen (TYLENOL) 325 MG tablet Take 2 tablets (650 mg total) by mouth every 6 (six) hours as needed.    Marland Kitchen albuterol (PROVENTIL HFA;VENTOLIN HFA) 108 (90 BASE) MCG/ACT inhaler Inhale 1 puff into the lungs every 6 (six) hours as needed for wheezing or shortness of breath.    . allopurinol (ZYLOPRIM) 100 MG tablet Take 100 mg by mouth daily.    Marland Kitchen amLODipine (NORVASC) 10 MG tablet Take 10 mg by mouth daily.  2  . aspirin EC 81 MG EC tablet Take 1 tablet (81 mg total) by mouth daily. 90 tablet 3  . cloNIDine (CATAPRES) 0.1 MG tablet Take 0.1 mg by mouth 2 (two) times daily.  3  . FLOVENT HFA 110 MCG/ACT inhaler Inhale 1 puff into the lungs 2 (two) times daily as needed (shortness of breath).   3  . hydrochlorothiazide (HYDRODIURIL) 25 MG tablet Take 25 mg by mouth daily.   2  . losartan (COZAAR) 100 MG tablet Take 100 mg by mouth daily.    . meloxicam (MOBIC) 7.5 MG tablet Take 7.5 mg by mouth daily.    . metFORMIN  (GLUCOPHAGE) 1000 MG tablet Take 1 tablet (1,000 mg total) by mouth 2 (two) times daily with a meal. 60 tablet 0  . nitroGLYCERIN (NITROSTAT) 0.4 MG SL tablet Place 1 tablet (0.4 mg total) under the tongue every 5 (five) minutes x 3 doses as needed for chest pain. 30 tablet 3  . rosuvastatin (CRESTOR) 40 MG tablet Take 40 mg by mouth daily.    Marland Kitchen tiZANidine (ZANAFLEX) 4 MG capsule Take 4 mg by mouth as needed for muscle spasms.   1  . gabapentin (NEURONTIN) 300 MG capsule Take 300 mg by mouth 2 (two) times daily.    Marland Kitchen lisinopril (ZESTRIL) 20 MG tablet Take 20 mg by mouth daily.    . traZODone (DESYREL) 50 MG tablet Take 1 tablet (50 mg total) by mouth at bedtime as needed for sleep. 30 tablet 0  . [DISCONTINUED] labetalol (NORMODYNE) 200 MG tablet Take 1 tablet (200 mg total) by mouth 2 (two) times daily. (Patient not taking: Reported on 03/25/2019) 60 tablet 0   No current facility-administered medications on file prior to visit.     Cardiovascular studies:  EKG 08/07/2019: Sinus tachycardia 102 bpm. Possible old inferior infarct.  Left atrial enlargement.   RLE venous duplex US 09/2018: Final Interpretation: Right: Findings consistent with acute deep vein thrombosis involving the right peroneal vein, and right posterior tibial vein. No cystic structure found in the popliteal fossa. Left: No evidence of common femoral vein obstruction   Echocardiogram 02/11/2018: Left ventricle cavity is normal in size. Mild concentric hypertrophy of the left ventricle. Mild decrease in global wall motion. Visual EF is 45-50%. Doppler evidence of grade I (impaired) diastolic dysfunction, normal LAP. Calculated EF 45%. Left atrial cavity is mildly dilated. Inadequate tricuspid regurgitation jet to estimate pulmonary artery pressure IVC is dilated with blunted respiratory response. Estimated RA pressure 10-15 mmHg.  Lexiscan myoview stress test 01/31/2018:  1. Pharmacologic stress testing was performed  with intravenous administration of .4 mg of Lexiscan over a 10-15 seconds infusion. Patient was hypertensive throughout the study, peak BP 200/120 mmHg.  Stress symptoms included dyspnea, dizziness.  2. Exercise capacity not assessed. Stress EKG is non diagnostic for ischemia as it is a pharmacologic stress.  3. The overall quality of the study is good.  Left ventricular cavity is noted to be normal on the rest and stress studies.  Gated SPECT images reveal normal myocardial thickening and wall motion.  The left ventricular ejection fraction was calculated or visually estimated to be 41%.  SPECT images demonstrate small perfusion abnormality of mild intensity in the mid inferolateral myocardial wall(s) on the stress images with mild reversibility on rest images.  4. Low to intermediate risk study.  Recent labs: Results for Darryl FlorasBROWN, Brandol R (MRN [id number]) as of 08/07/2019 13:42  Ref. Range 04/22/2019 02:34  Sodium Latest Ref Range: 135 - 145 mmol/L 142  Potassium Latest Ref Range: 3.5 - 5.1 mmol/L 3.8  Chloride Latest Ref Range: 98 - 111 mmol/L 107  CO2 Latest Ref Range: 22 - 32 mmol/L 22  Glucose Latest Ref Range: 70 - 99 mg/dL 99  BUN Latest Ref Range: 6 - 20 mg/dL 18  Creatinine Latest Ref Range: 0.61 - 1.24 mg/dL 4.091.16  Calcium Latest Ref Range: 8.9 - 10.3 mg/dL 9.3  Anion gap Latest Ref Range: 5 - 15  13  Alkaline Phosphatase Latest Ref Range: 38 - 126 U/L 47  Albumin Latest Ref Range: 3.5 - 5.0 g/dL 3.3 (L)  AST Latest Ref Range: 15 - 41 U/L 44 (H)  ALT Latest Ref Range: 0 - 44 U/L 24  Total Protein Latest Ref Range: 6.5 - 8.1 g/dL 7.3  Total Bilirubin Latest Ref Range: 0.3 - 1.2 mg/dL 0.4  GFR, Est Non African American Latest Ref Range: >60 mL/min >60  GFR, Est African American Latest Ref Range: >60 mL/min >60  Troponin I Latest Ref Range: <0.03 ng/mL <0.03   Results for Darryl FlorasBROWN, Elmon R (MRN [id number]) as of 08/07/2019 13:42  Ref. Range 04/22/2019 04:17  COCAINE Latest Ref Range: NONE  DETECTED  POSITIVE (A)    Review of Systems  Constitution: Negative for decreased appetite, malaise/fatigue, weight gain and weight loss.  HENT: Negative for congestion.   Eyes: Negative for visual disturbance.  Cardiovascular: Negative for chest pain, dyspnea on exertion, palpitations and syncope.  Respiratory: Negative for shortness of breath.   Endocrine: Negative for cold intolerance.  Hematologic/Lymphatic: Does not bruise/bleed easily.  Skin: Negative for itching and rash.  Musculoskeletal: Negative for myalgias.       H/o DVT  Gastrointestinal: Negative for abdominal pain, nausea and vomiting.  Genitourinary: Negative for dysuria.  Neurological: Negative for dizziness and weakness.  Psychiatric/Behavioral: Positive for substance abuse (Alcohol). The patient is not nervous/anxious.   All other systems reviewed and are negative.      Objective:    There were no vitals filed for this visit.   Physical Exam  Constitutional: He is oriented to person, place, and time. He appears well-developed and well-nourished. No distress.  HENT:  Head: Normocephalic and atraumatic.  Eyes: Pupils are equal, round, and reactive to light. Conjunctivae are normal.  Neck: No JVD present.  Cardiovascular: Normal rate, regular rhythm and intact distal pulses.  No murmur heard. Pulmonary/Chest: Effort normal and breath sounds normal. He has no wheezes. He has no rales.  Abdominal: Soft. Bowel sounds are normal. There is no rebound.  Musculoskeletal:        General: No edema.  Lymphadenopathy:    He has no cervical adenopathy.  Neurological: He is alert and oriented to person, place, and time. No cranial nerve deficit.  Skin: Skin is warm and dry.  Psychiatric: He has a normal mood and affect.  Nursing note and vitals reviewed.       Assessment & Recommendations:   58 -year-old African-American male with hypertension, type II diabetes  mellitus, tobacco abuse, gout, midly reduced LVEF  and mildly abnormal stress test (01/2018), h/o DVT.   1. Hypertension While his blood pressure is elevated, I have not made any changes to his medications. This is due to potential for multiple duplications causing significant noncompliance.  I have personally reviewed his mediations and asked his to not use any medication from his previous prescriptions in bottles, but only take medications from monthly packs provided by his pharmacy. He needs to follow up with one [facility] care provider and to avoid receiving prescriptions from multiple providers, which adds to his confusion. He may benefit from home health for medication management.   2. H/o abnormal stress test: No current angina symptoms. Needs aggressive medical management   Nigel Mormon, MD [facility] Pager: [phone] Office: [phone] If no answer Cell [phone]

## 2019-08-07 NOTE — Progress Notes (Signed)
Polypharmacy  

## 2019-08-07 NOTE — Progress Notes (Signed)
Monthly pack

## 2019-08-08 ENCOUNTER — Encounter: Payer: Self-pay | Admitting: Cardiology

## 2019-08-08 ENCOUNTER — Other Ambulatory Visit: Payer: Self-pay | Admitting: Cardiology

## 2019-08-12 ENCOUNTER — Ambulatory Visit: Payer: Medicaid Other | Admitting: Cardiology

## 2019-09-10 ENCOUNTER — Other Ambulatory Visit: Payer: Self-pay

## 2019-09-10 ENCOUNTER — Emergency Department (HOSPITAL_COMMUNITY)
Admission: EM | Admit: 2019-09-10 | Discharge: 2019-09-10 | Disposition: A | Discharge status: Home / Self Care | Payer: Medicaid Other | Attending: Emergency Medicine | Admitting: Emergency Medicine

## 2019-09-10 ENCOUNTER — Emergency Department (HOSPITAL_COMMUNITY): Payer: Medicaid Other

## 2019-09-10 ENCOUNTER — Encounter (HOSPITAL_COMMUNITY): Payer: Self-pay | Admitting: Emergency Medicine

## 2019-09-10 DIAGNOSIS — Z79899 Other long term (current) drug therapy: Secondary | ICD-10-CM | POA: Diagnosis not present

## 2019-09-10 DIAGNOSIS — F17210 Nicotine dependence, cigarettes, uncomplicated: Secondary | ICD-10-CM | POA: Insufficient documentation

## 2019-09-10 DIAGNOSIS — J449 Chronic obstructive pulmonary disease, unspecified: Secondary | ICD-10-CM | POA: Insufficient documentation

## 2019-09-10 DIAGNOSIS — R0789 Other chest pain: Secondary | ICD-10-CM | POA: Insufficient documentation

## 2019-09-10 DIAGNOSIS — Z7982 Long term (current) use of aspirin: Secondary | ICD-10-CM | POA: Insufficient documentation

## 2019-09-10 DIAGNOSIS — R079 Chest pain, unspecified: Secondary | ICD-10-CM

## 2019-09-10 DIAGNOSIS — E119 Type 2 diabetes mellitus without complications: Secondary | ICD-10-CM | POA: Diagnosis not present

## 2019-09-10 DIAGNOSIS — I10 Essential (primary) hypertension: Secondary | ICD-10-CM | POA: Insufficient documentation

## 2019-09-10 DIAGNOSIS — Z7984 Long term (current) use of oral hypoglycemic drugs: Secondary | ICD-10-CM | POA: Insufficient documentation

## 2019-09-10 LAB — BASIC METABOLIC PANEL
Anion gap: 14 (ref 5–15)
BUN: 13 mg/dL (ref 6–20)
CO2: 23 mmol/L (ref 22–32)
Calcium: 9.5 mg/dL (ref 8.9–10.3)
Chloride: 100 mmol/L (ref 98–111)
Creatinine, Ser: 1.08 mg/dL (ref 0.61–1.24)
GFR calc Af Amer: >60 mL/min (ref >60)
GFR calc non Af Amer: >60 mL/min (ref >60)
Glucose, Bld: 115 mg/dL — ABNORMAL HIGH (ref 70–99)
Potassium: 4.3 mmol/L (ref 3.5–5.1)
Sodium: 137 mmol/L (ref 135–145)

## 2019-09-10 LAB — CBC
HCT: 44 % (ref 39.0–52.0)
Hemoglobin: 14.6 g/dL (ref 13.0–17.0)
MCH: 30.5 pg (ref 26.0–34.0)
MCHC: 33.2 g/dL (ref 30.0–36.0)
MCV: 91.9 fL (ref 80.0–100.0)
Platelets: 346 10*3/uL (ref 150–400)
RBC: 4.79 MIL/uL (ref 4.22–5.81)
RDW: 15.7 % — ABNORMAL HIGH (ref 11.5–15.5)
WBC: 9.3 10*3/uL (ref 4.0–10.5)
nRBC: 0 % (ref 0.0–0.2)

## 2019-09-10 LAB — TROPONIN I (HIGH SENSITIVITY)
Troponin I (High Sensitivity): 8 ng/L (ref <18)
Troponin I (High Sensitivity): 9 ng/L (ref <18)

## 2019-09-10 IMAGING — DX DG CHEST 2V
2 series · 2 of 2 positions shown · non-contrast
Comparison: [DATE], [DATE]

CLINICAL DATA: Chest pain short of breath cough, asthma

EXAM:
CHEST - 2 VIEW

[chest pa]
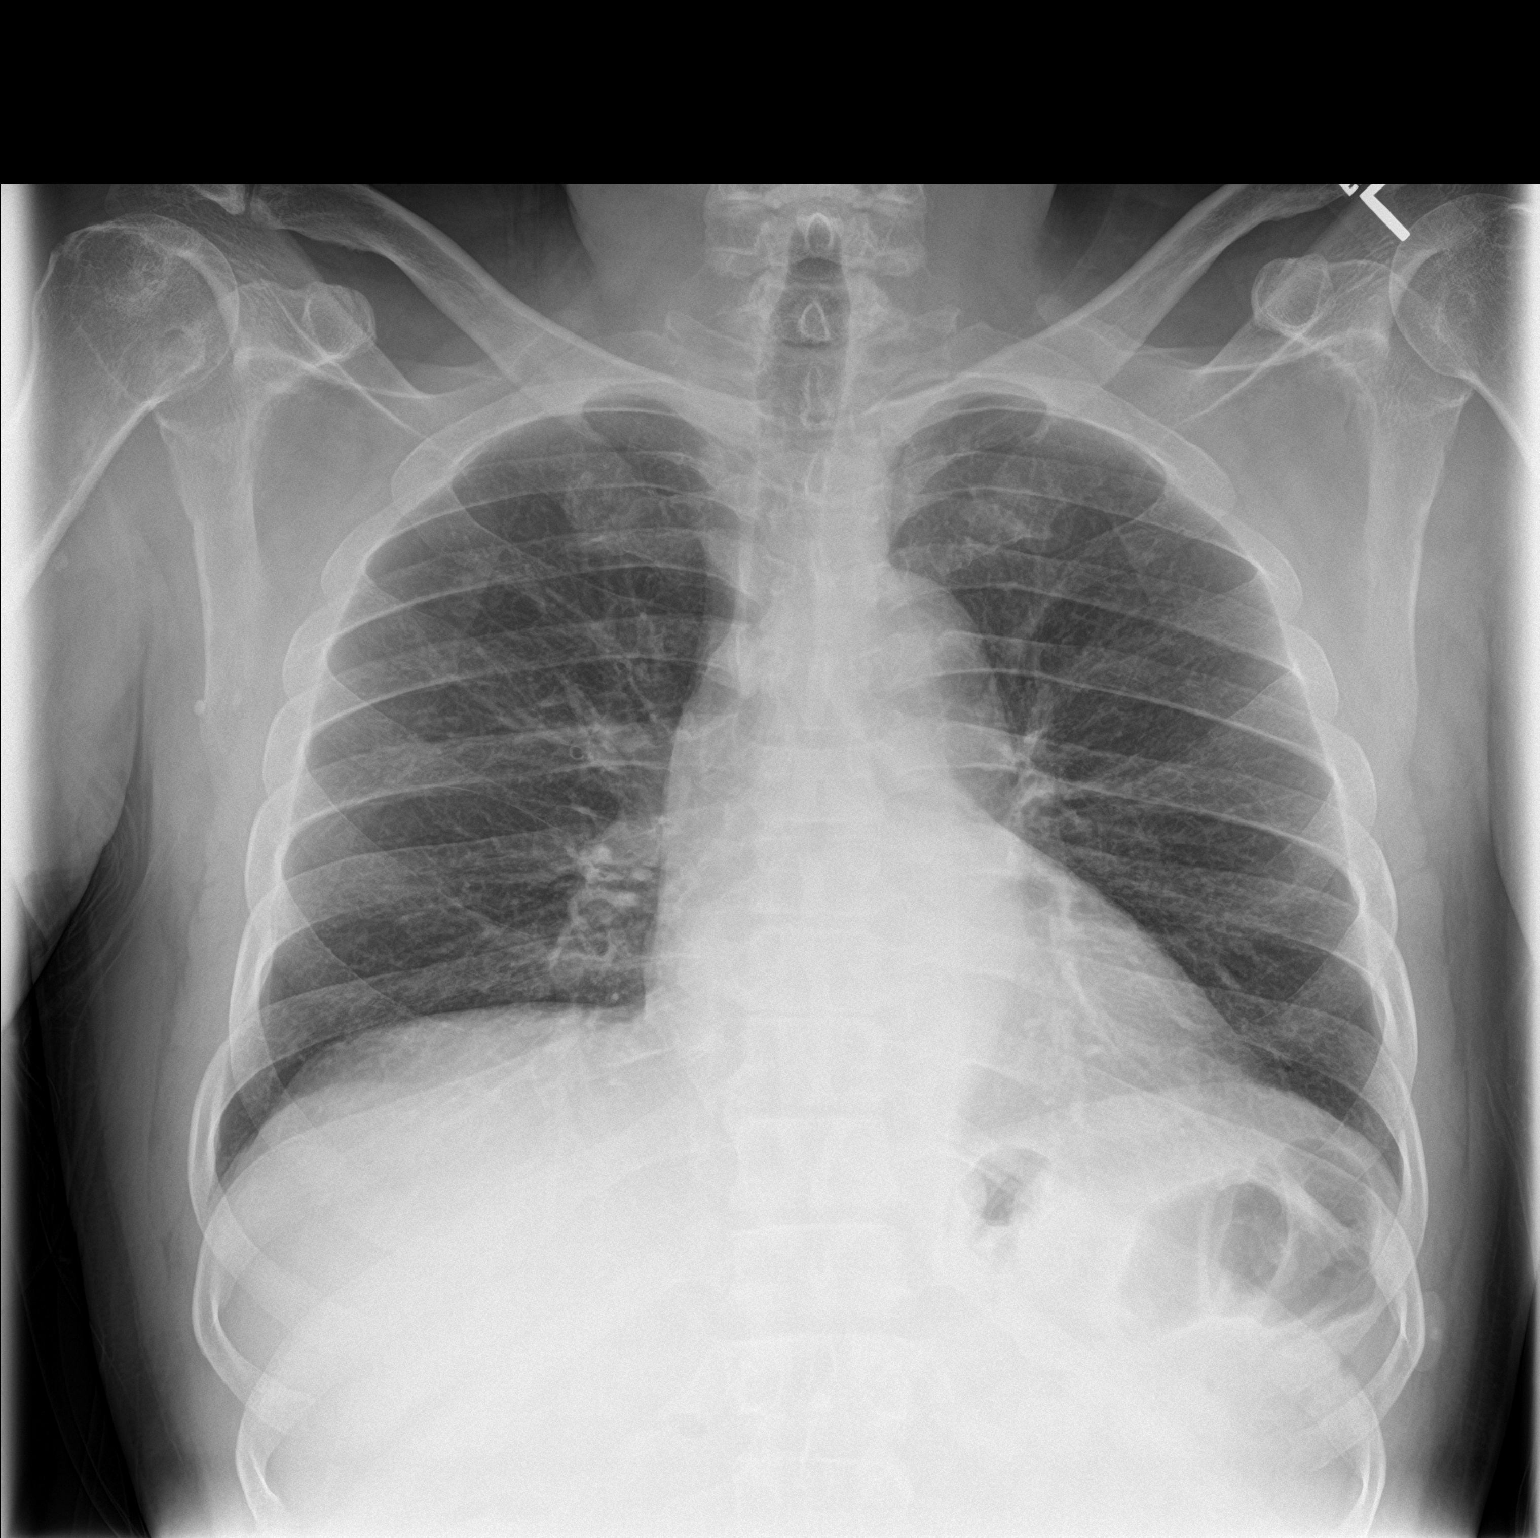

[chest lat]
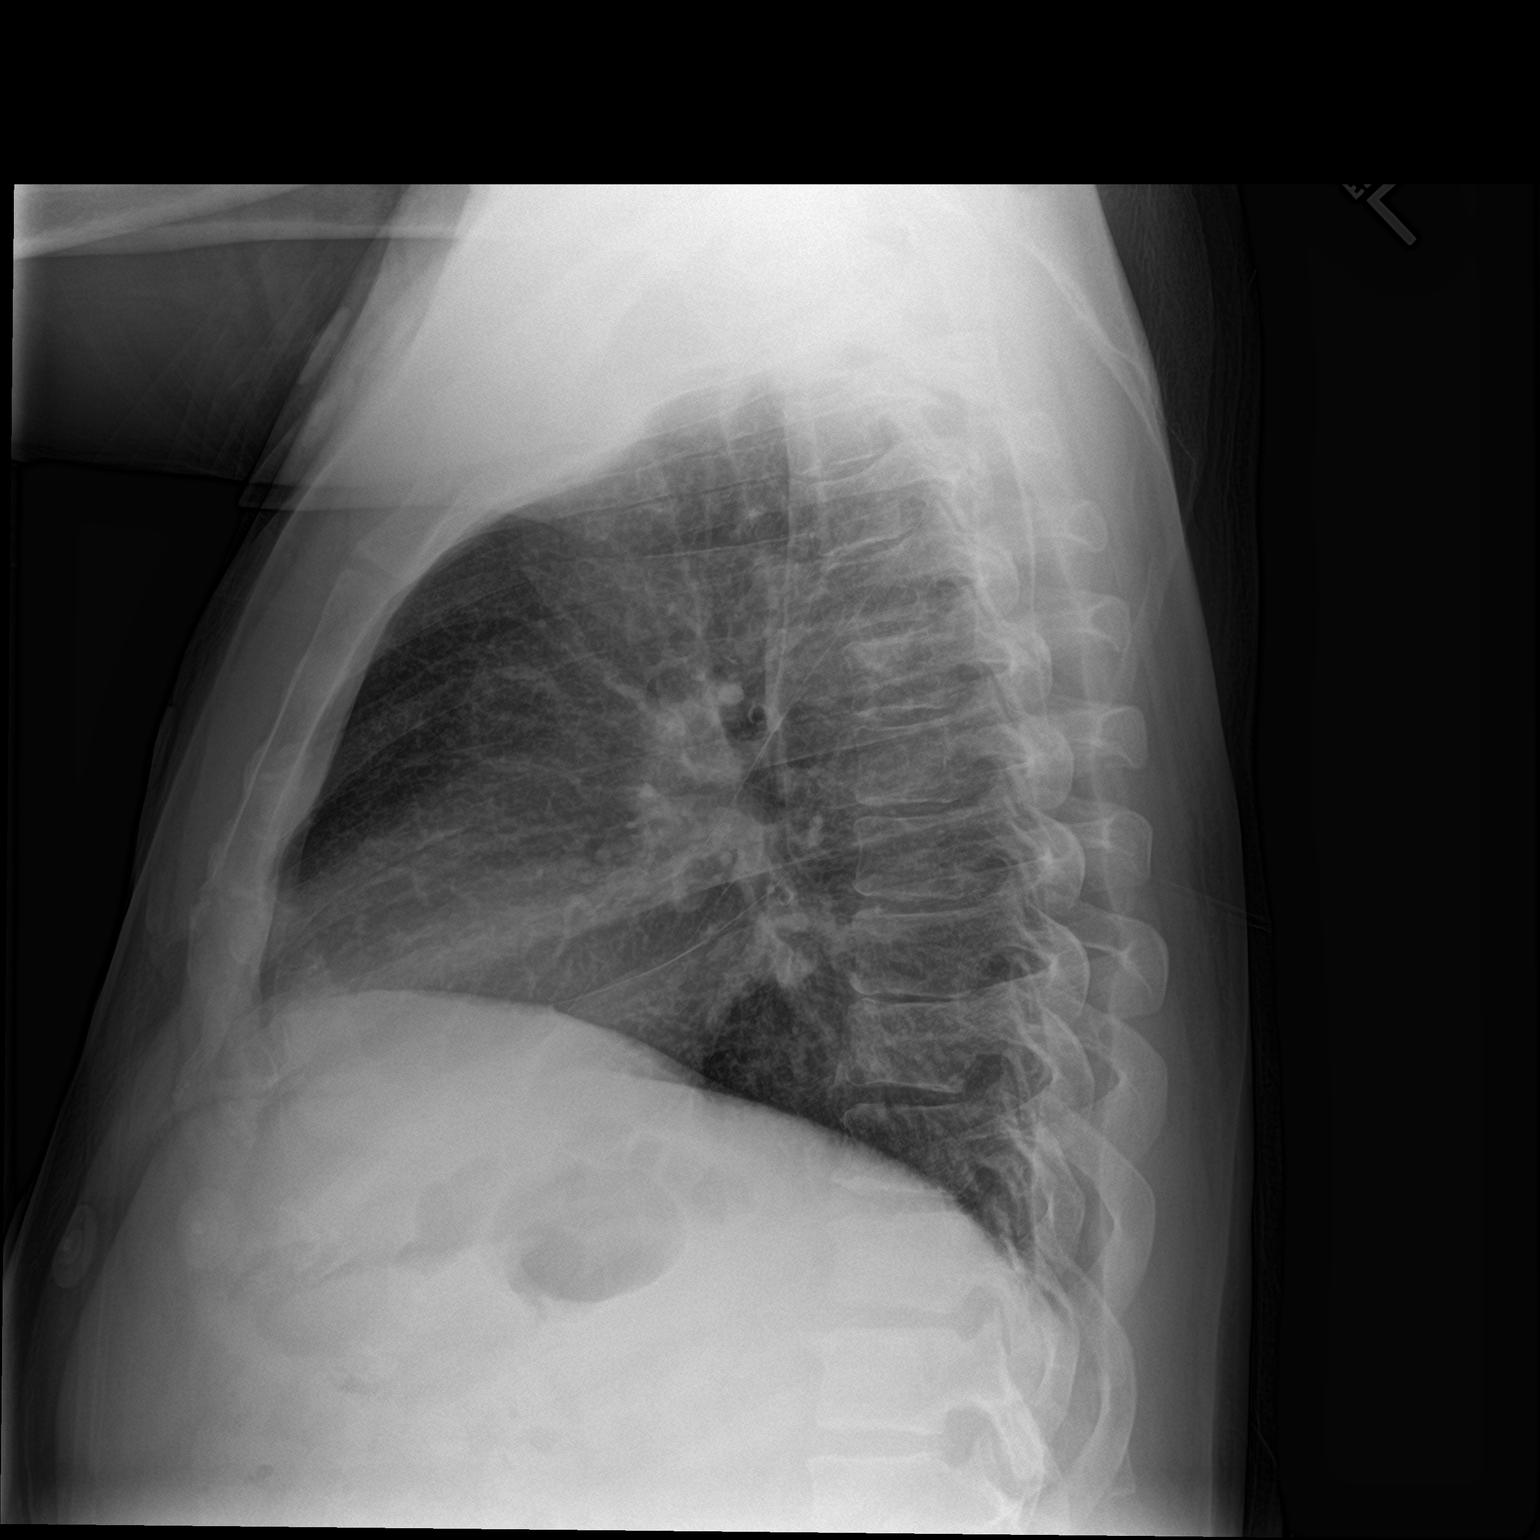

[2 of 2 positions shown; findings below may reference images not displayed]

FINDINGS: The heart size and mediastinal contours are within normal limits.
Both lungs are clear. The visualized skeletal structures are
unremarkable.
IMPRESSION: No active cardiopulmonary disease.

## 2019-09-10 MED ORDER — DEXAMETHASONE SODIUM PHOSPHATE 10 MG/ML IJ SOLN
10.0000 mg | Freq: Once | INTRAMUSCULAR | Status: AC
  Administered 2019-09-10: 10 mg via INTRAVENOUS
  Filled 2019-09-10: qty 1

## 2019-09-10 MED ORDER — LORAZEPAM 2 MG/ML IJ SOLN
1.0000 mg | Freq: Once | INTRAMUSCULAR | Status: AC
  Administered 2019-09-10: 1 mg via INTRAVENOUS
  Filled 2019-09-10: qty 1

## 2019-09-10 MED ORDER — SODIUM CHLORIDE 0.9% FLUSH
3.0000 mL | Freq: Once | INTRAVENOUS | Status: AC
  Administered 2019-09-10: 3 mL via INTRAVENOUS

## 2019-09-10 MED ORDER — ALBUTEROL SULFATE HFA 108 (90 BASE) MCG/ACT IN AERS
2.0000 | INHALATION_SPRAY | Freq: Once | RESPIRATORY_TRACT | Status: AC
  Administered 2019-09-10: 11:00:00 2 via RESPIRATORY_TRACT
  Filled 2019-09-10: qty 6.7

## 2019-09-10 NOTE — ED Notes (Addendum)
Pt states that he took 1 nitro at home for his CP, "but it made it worse." Pt adds "this all started last week while I was helping a friend work on his car; I hurt my arm and it feels like something tore through my left side with the pain. I got worse last night. It even hurts to cough"

## 2019-09-10 NOTE — ED Triage Notes (Signed)
Pt reports chest pain for 1 week and states that it has gotten worse.

## 2019-09-10 NOTE — ED Provider Notes (Signed)
[place] EMERGENCY DEPARTMENT Provider Note   CSN: [id number] Arrival date & time: 09/10/19  7867     History   Chief Complaint Chief Complaint  Patient presents with  . Chest Pain    HPI Darryl Hurley is a 58 y.o. male.     HPI   58 year old male with chest pain.  Left-sided.  Onset almost a week ago while helping his friend work on a car.  Worse with movement.  Worse with coughing and deep breathing.  No fevers or chills.  He states that he tried taken nitroglycerin but feels like if anything this made the pain worse.  No unusual leg pain or swelling.  Past Medical History:  Diagnosis Date  . Alcohol abuse   . Angina pectoris   . Arthritis   . Asthma   . Chronic lower back pain   . COPD (chronic obstructive pulmonary disease) (HCC)    on home O2 2L  . Diabetes mellitus without complication ([place])   . Exertional shortness of breath   . Gout   . Hypertension   . Seizures [facility])     Patient Active Problem List   Diagnosis Date Noted  . Lactic acidosis 01/06/2016  . Lactic acid acidosis 01/06/2016  . Alcohol abuse with intoxication ([place]) 01/06/2016  . COPD with acute exacerbation ([place]) 02/02/2014  . HCAP (healthcare-associated pneumonia) 02/02/2014  . Increased anion gap metabolic acidosis 67/20/9470  . Cocaine abuse ([place]) 11/18/2013  . Fever 11/17/2013  . Anemia 11/17/2013  . Hypokalemia 11/17/2013  . Polyarthritis 11/17/2013  . Dehydration 11/17/2013  . Type 2 diabetes mellitus ([place]) 05/14/2013  . Chronic obstructive pulmonary disease (COPD) ([place]) 05/12/2013  . Gout 05/12/2013  . Headache 05/12/2013  . Asthma 05/04/2013  . TOBACCO ABUSE 04/27/2009  . Uncontrolled hypertension 04/27/2009    Past Surgical History:  Procedure Laterality Date  . NO PAST SURGERIES          Home Medications    Prior to Admission medications   Medication Sig Start Date End Date Taking? Authorizing Provider  acetaminophen (TYLENOL) 325 MG tablet  Take 2 tablets (650 mg total) by mouth every 6 (six) hours as needed. 05/20/19   Lamptey, Myrene Galas, MD  albuterol (PROVENTIL HFA;VENTOLIN HFA) 108 (90 BASE) MCG/ACT inhaler Inhale 1 puff into the lungs every 6 (six) hours as needed for wheezing or shortness of breath.    [provider]  allopurinol (ZYLOPRIM) 100 MG tablet Take 100 mg by mouth daily.    [provider]  amLODipine (NORVASC) 10 MG tablet Take 10 mg by mouth daily. 08/06/18   [provider]  aspirin EC 81 MG EC tablet Take 1 tablet (81 mg total) by mouth daily. 03/19/19   Patwardhan, Reynold Bowen, MD  cloNIDine (CATAPRES) 0.1 MG tablet Take 0.1 mg by mouth 2 (two) times daily. 08/15/18   [provider]  FLOVENT HFA 110 MCG/ACT inhaler Inhale 1 puff into the lungs 2 (two) times daily as needed (shortness of breath).  08/06/18   [provider]  gabapentin (NEURONTIN) 300 MG capsule Take 300 mg by mouth 2 (two) times daily. 03/04/19   [provider]  hydrochlorothiazide (HYDRODIURIL) 25 MG tablet Take 25 mg by mouth daily.  07/14/18   [provider]  lisinopril (ZESTRIL) 20 MG tablet Take 20 mg by mouth daily.    [provider]  losartan (COZAAR) 100 MG tablet Take 100 mg by mouth daily.    [provider]  meloxicam (MOBIC) 7.5 MG tablet Take 7.5 mg by mouth daily.    [provider]  metFORMIN (GLUCOPHAGE) 1000 MG tablet Take 1 tablet (1,000 mg total) by mouth 2 (two) times daily with a meal. 01/08/16   Alison Murrayevine, Alma M, MD  nitroGLYCERIN (NITROSTAT) 0.4 MG SL tablet Place 1 tablet (0.4 mg total) under the tongue every 5 (five) minutes x 3 doses as needed for chest pain. 03/18/19   Patwardhan, Anabel BeneManish J, MD  rosuvastatin (CRESTOR) 40 MG tablet Take 40 mg by mouth daily.    [provider]  tiZANidine (ZANAFLEX) 4 MG capsule Take 4 mg by mouth as needed for muscle spasms.  06/25/18   [provider]  traZODone (DESYREL) 50 MG tablet Take 1  tablet (50 mg total) by mouth at bedtime as needed for sleep. 05/20/19   Lamptey, Britta MccreedyPhilip O, MD  labetalol (NORMODYNE) 200 MG tablet Take 1 tablet (200 mg total) by mouth 2 (two) times daily. Patient not taking: Reported on 03/25/2019 02/16/19 05/20/19  Elder NegusPatwardhan, Manish J, MD    Family History Family History  Problem Relation Age of Onset  . Diabetes type II Mother   . Depression Father   . Suicidality Father   . Asthma Brother     Social History Social History   Tobacco Use  . Smoking status: Current Every Day Smoker    Packs/day: 0.25    Years: 40.00    Pack years: 10.00    Types: Cigarettes  . Smokeless tobacco: Never Used  . Tobacco comment: 05/12/2013 'eased off smoking since last month"  Substance Use Topics  . Alcohol use: Yes    Comment: 4 40 oz beers per day, 1 pint of liqor in a day  . Drug use: No    Types: "Crack" cocaine     Allergies   Nsaids and Toradol [ketorolac tromethamine]   Review of Systems Review of Systems  All systems reviewed and negative, other than as noted in HPI.  Physical Exam Updated Vital Signs BP (!) 187/103 (BP Location: Right Arm)   Pulse 96   Temp 98.1 F (36.7 C) (Oral)   Resp 18   Ht 5\' 5"  (1.651 m)   Wt 81.6 kg   SpO2 96%   BMI 29.95 kg/m   Physical Exam Vitals signs and nursing note reviewed.  Constitutional:      General: He is not in acute distress.    Appearance: He is well-developed.  HENT:     Head: Normocephalic and atraumatic.  Eyes:     General:        Right eye: No discharge.        Left eye: No discharge.     Conjunctiva/sclera: Conjunctivae normal.  Neck:     Musculoskeletal: Neck supple.  Cardiovascular:     Rate and Rhythm: Normal rate and regular rhythm.     Heart sounds: Normal heart sounds. No murmur. No friction rub. No gallop.      Comments: There is tenderness to palpation over the left lateral chest wall.  No overlying skin changes.  No crepitus.  Faint expiratory wheezing bilaterally.  No  increased work of breathing.  No midline spinal tenderness.  No murmur appreciated. Pulmonary:     Effort: Pulmonary effort is normal. No respiratory distress.  Chest:     Chest wall: Tenderness present.  Abdominal:     General: There is no distension.     Palpations: Abdomen is soft.  Tenderness: There is no abdominal tenderness.  Musculoskeletal:        General: No tenderness.  Skin:    General: Skin is warm and dry.  Neurological:     Mental Status: He is alert.  Psychiatric:        Behavior: Behavior normal.        Thought Content: Thought content normal.      ED Treatments / Results  Labs (all labs ordered are listed, but only abnormal results are displayed) Labs Reviewed  BASIC METABOLIC PANEL - Abnormal; Notable for the following components:      Result Value   Glucose, Bld 115 (*)    All other components within normal limits  CBC - Abnormal; Notable for the following components:   RDW 15.7 (*)    All other components within normal limits  TROPONIN I (HIGH SENSITIVITY)  TROPONIN I (HIGH SENSITIVITY)    EKG EKG Interpretation  Date/Time:  Thursday September 10 2019 09:25:36 EDT Ventricular Rate:  81 PR Interval:    QRS Duration: 82 QT Interval:  375 QTC Calculation: 436 R Axis:   59 Text Interpretation:  Sinus rhythm Borderline repolarization abnormality Confirmed by Raeford Razor [phone]) on 09/10/2019 10:10:40 AM   Radiology Dg Chest 2 View  Result Date: 09/10/2019 CLINICAL DATA:  Chest pain short of breath cough, asthma EXAM: CHEST - 2 VIEW COMPARISON:  04/22/2019, 04/21/2019 FINDINGS: The heart size and mediastinal contours are within normal limits. Both lungs are clear. The visualized skeletal structures are unremarkable. IMPRESSION: No active cardiopulmonary disease. Electronically Signed   By: Marlan Palau M.D.   On: 09/10/2019 09:05    Procedures Procedures (including critical care time)  Medications Ordered in ED Medications  sodium  chloride flush (NS) 0.9 % injection 3 mL (has no administration in time range)     Initial Impression / Assessment and Plan / ED Course  I have reviewed the triage vital signs and the nursing notes.  Pertinent labs & imaging results that were available during my care of the patient were reviewed by me and considered in my medical decision making (see chart for details).        58 year old male with left-sided chest pain.  I suspect that this is chest wall pain.  Began when helping his friend work on a car.  Worse with movement, palpation.  Atypical symptoms for ACS.  Troponin normal x2.  Doubt PE, dissection or other emergent process.  Plan symptomatic treatment.  Return precautions discussed.  Outpatient follow-up otherwise.  Final Clinical Impressions(s) / ED Diagnoses   Final diagnoses:  Chest pain, unspecified type    ED Discharge Orders    None       Raeford Razor, MD 09/11/19 1009

## 2019-10-02 ENCOUNTER — Emergency Department (HOSPITAL_COMMUNITY): Payer: Medicaid Other

## 2019-10-02 ENCOUNTER — Emergency Department (HOSPITAL_COMMUNITY)
Admission: EM | Admit: 2019-10-02 | Discharge: 2019-10-02 | Disposition: A | Discharge status: Home / Self Care | Payer: Medicaid Other | Attending: Emergency Medicine | Admitting: Emergency Medicine

## 2019-10-02 ENCOUNTER — Encounter (HOSPITAL_COMMUNITY): Payer: Self-pay | Admitting: Emergency Medicine

## 2019-10-02 DIAGNOSIS — Z79899 Other long term (current) drug therapy: Secondary | ICD-10-CM | POA: Diagnosis not present

## 2019-10-02 DIAGNOSIS — I10 Essential (primary) hypertension: Secondary | ICD-10-CM | POA: Diagnosis not present

## 2019-10-02 DIAGNOSIS — F17210 Nicotine dependence, cigarettes, uncomplicated: Secondary | ICD-10-CM | POA: Diagnosis not present

## 2019-10-02 DIAGNOSIS — E119 Type 2 diabetes mellitus without complications: Secondary | ICD-10-CM | POA: Diagnosis not present

## 2019-10-02 DIAGNOSIS — J441 Chronic obstructive pulmonary disease with (acute) exacerbation: Secondary | ICD-10-CM | POA: Diagnosis not present

## 2019-10-02 DIAGNOSIS — Z7984 Long term (current) use of oral hypoglycemic drugs: Secondary | ICD-10-CM | POA: Diagnosis not present

## 2019-10-02 DIAGNOSIS — R0602 Shortness of breath: Secondary | ICD-10-CM | POA: Diagnosis present

## 2019-10-02 DIAGNOSIS — Z7982 Long term (current) use of aspirin: Secondary | ICD-10-CM | POA: Insufficient documentation

## 2019-10-02 DIAGNOSIS — Z20828 Contact with and (suspected) exposure to other viral communicable diseases: Secondary | ICD-10-CM | POA: Diagnosis not present

## 2019-10-02 LAB — CBC
HCT: 44.6 % (ref 39.0–52.0)
Hemoglobin: 14.5 g/dL (ref 13.0–17.0)
MCH: 30.7 pg (ref 26.0–34.0)
MCHC: 32.5 g/dL (ref 30.0–36.0)
MCV: 94.3 fL (ref 80.0–100.0)
Platelets: 381 10*3/uL (ref 150–400)
RBC: 4.73 MIL/uL (ref 4.22–5.81)
RDW: 15.6 % — ABNORMAL HIGH (ref 11.5–15.5)
WBC: 11.8 10*3/uL — ABNORMAL HIGH (ref 4.0–10.5)
nRBC: 0 % (ref 0.0–0.2)

## 2019-10-02 LAB — COMPREHENSIVE METABOLIC PANEL
ALT: 12 U/L (ref 0–44)
AST: 16 U/L (ref 15–41)
Albumin: 3.7 g/dL (ref 3.5–5.0)
Alkaline Phosphatase: 64 U/L (ref 38–126)
Anion gap: 15 (ref 5–15)
BUN: 15 mg/dL (ref 6–20)
CO2: 21 mmol/L — ABNORMAL LOW (ref 22–32)
Calcium: 9.8 mg/dL (ref 8.9–10.3)
Chloride: 101 mmol/L (ref 98–111)
Creatinine, Ser: 1.44 mg/dL — ABNORMAL HIGH (ref 0.61–1.24)
GFR calc Af Amer: >60 mL/min (ref >60)
GFR calc non Af Amer: 54 mL/min — ABNORMAL LOW (ref >60)
Glucose, Bld: 163 mg/dL — ABNORMAL HIGH (ref 70–99)
Potassium: 3.6 mmol/L (ref 3.5–5.1)
Sodium: 137 mmol/L (ref 135–145)
Total Bilirubin: 1.4 mg/dL — ABNORMAL HIGH (ref 0.3–1.2)
Total Protein: 7.7 g/dL (ref 6.5–8.1)

## 2019-10-02 IMAGING — DX DG CHEST 2V
2 series · 2 of 2 positions shown · non-contrast
Comparison: [DATE]

CLINICAL DATA: Cough, chest congestion and shortness of breath.

EXAM:
CHEST - 2 VIEW

[chest lat]
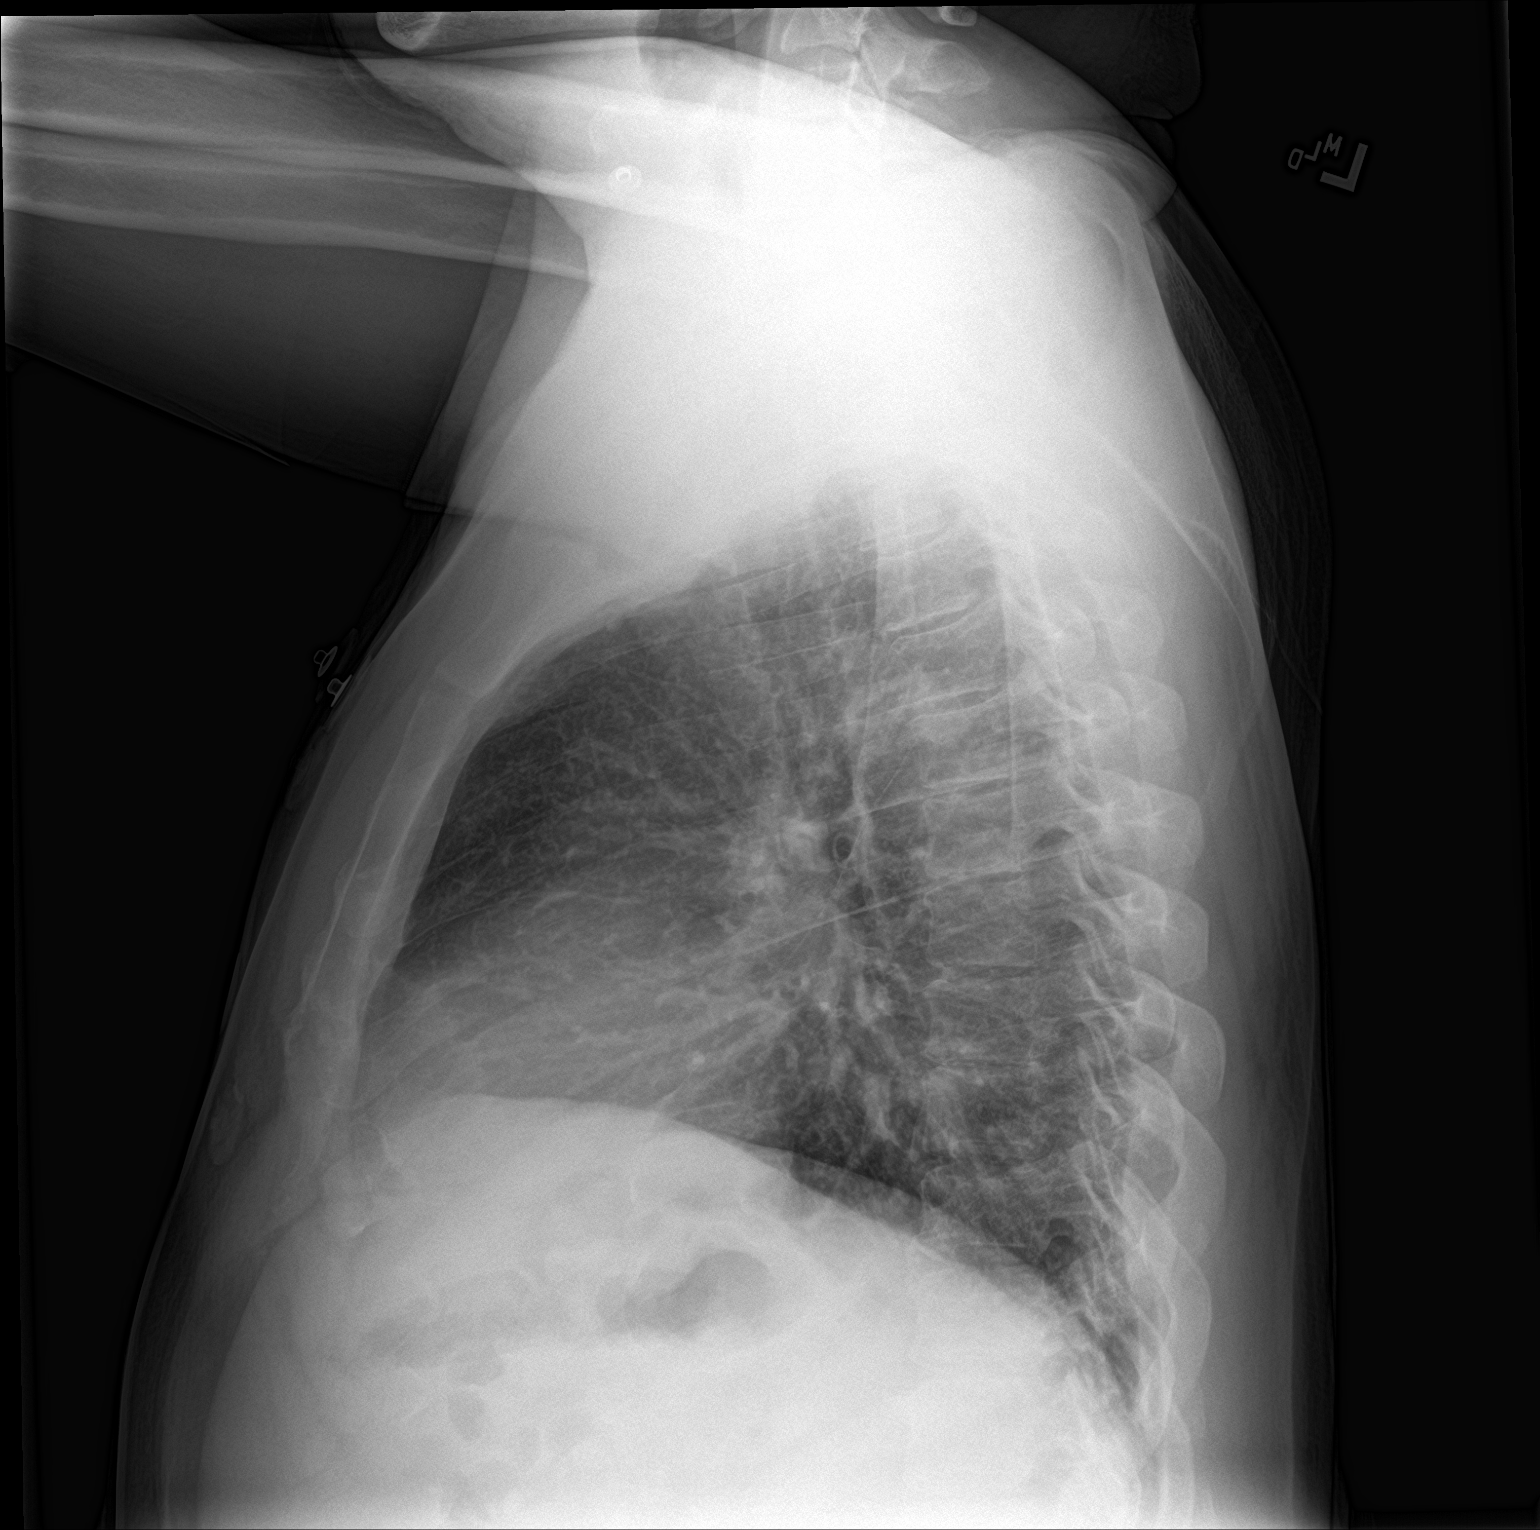

[chest ap]
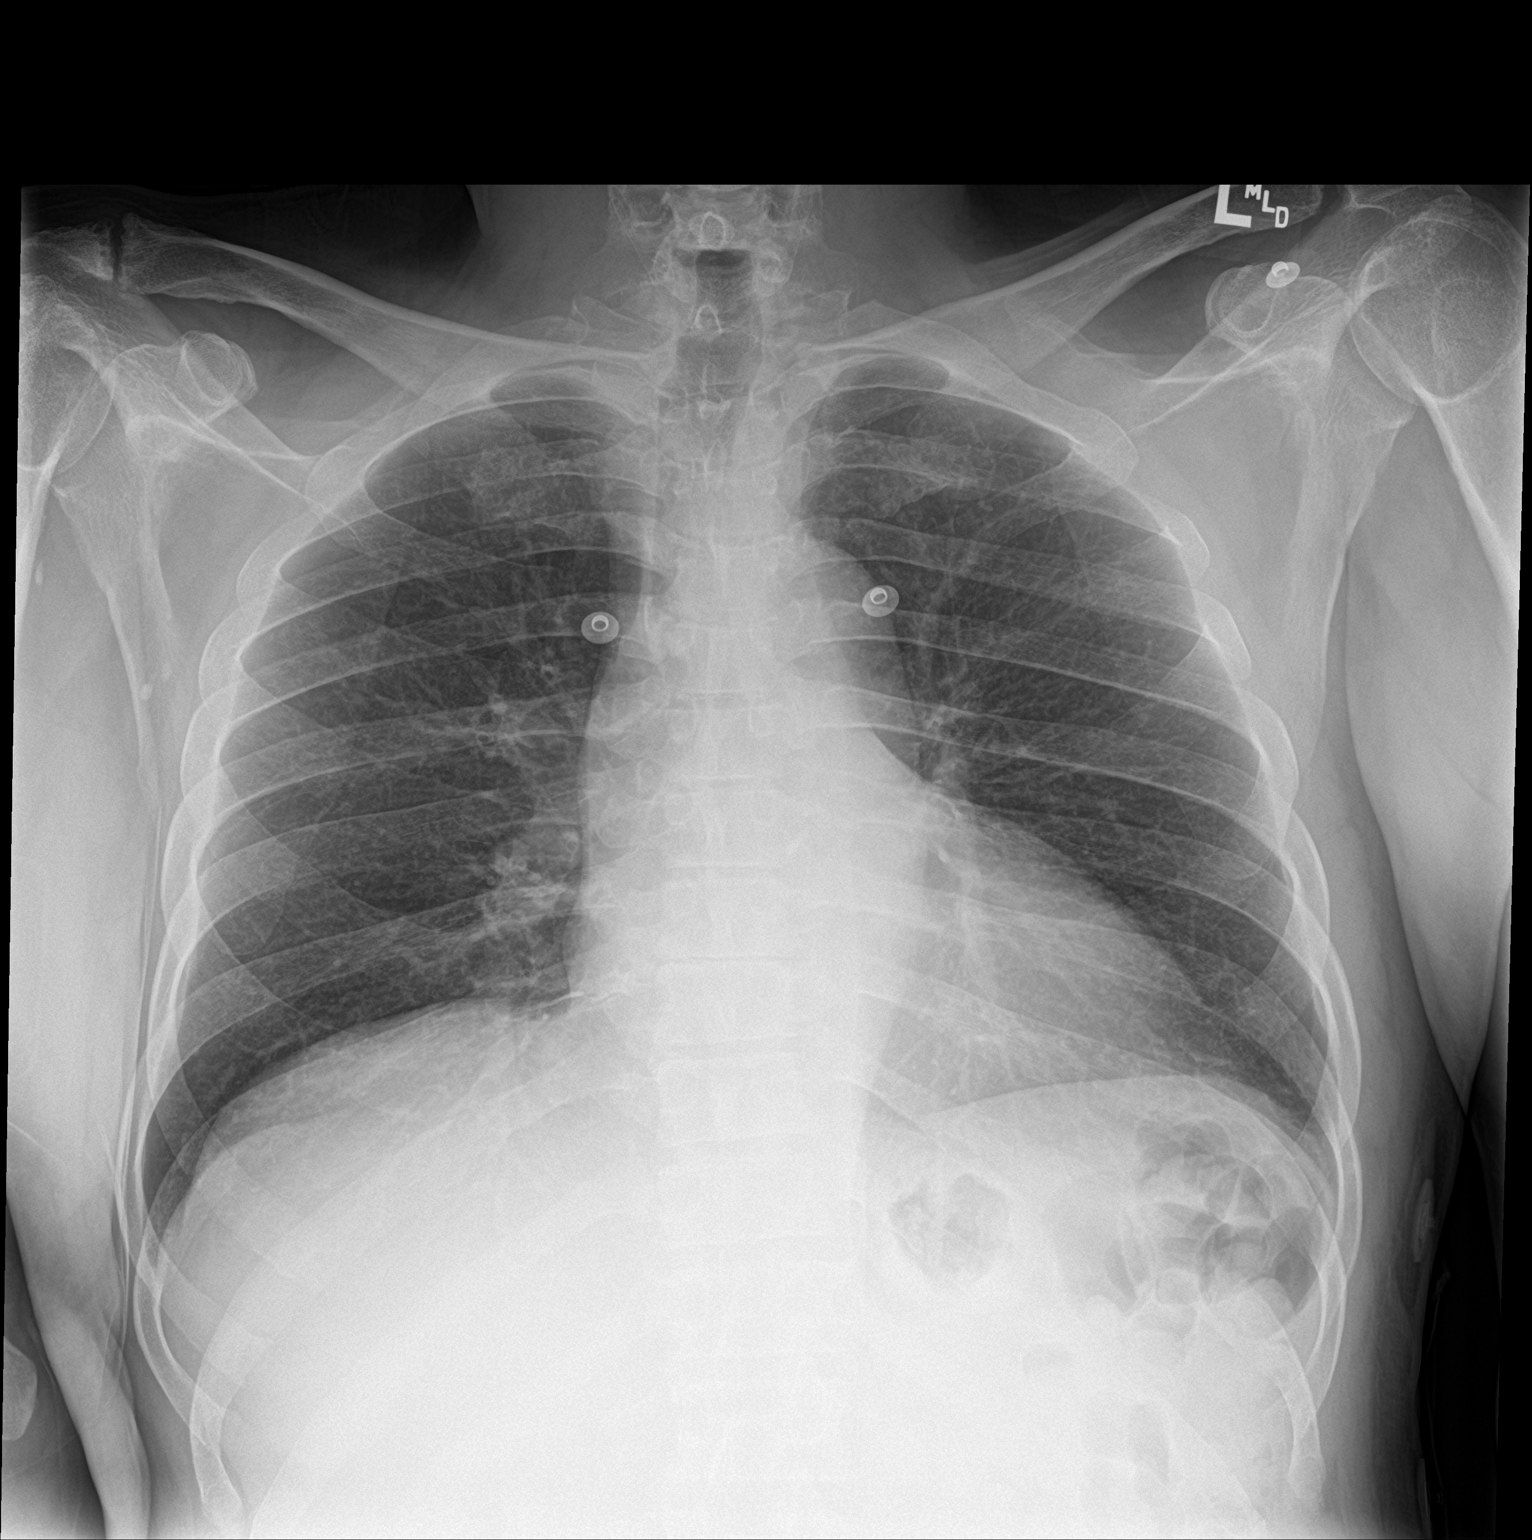

[2 of 2 positions shown; findings below may reference images not displayed]

FINDINGS: Poor inspiration. Normal sized heart. Clear lungs. Mild diffuse
peribronchial thickening without significant change. Unremarkable
bones.
IMPRESSION: Stable mild chronic bronchitic changes. No acute abnormality.

## 2019-10-02 MED ORDER — DEXAMETHASONE SODIUM PHOSPHATE 10 MG/ML IJ SOLN
10.0000 mg | Freq: Once | INTRAMUSCULAR | Status: AC
  Administered 2019-10-02: 10 mg via INTRAVENOUS
  Filled 2019-10-02: qty 1

## 2019-10-02 MED ORDER — PREDNISONE 10 MG PO TABS: 50.0000 mg | ORAL_TABLET | Freq: Every day | ORAL | 0 refills | Status: DC

## 2019-10-02 MED ORDER — DOXYCYCLINE HYCLATE 100 MG PO CAPS: 100.0000 mg | ORAL_CAPSULE | Freq: Two times a day (BID) | ORAL | 0 refills | Status: DC

## 2019-10-02 MED ORDER — DOXYCYCLINE HYCLATE 100 MG PO TABS
100.0000 mg | ORAL_TABLET | Freq: Once | ORAL | Status: AC
  Administered 2019-10-02: 100 mg via ORAL
  Filled 2019-10-02: qty 1

## 2019-10-02 MED ORDER — ALBUTEROL SULFATE HFA 108 (90 BASE) MCG/ACT IN AERS
6.0000 | INHALATION_SPRAY | RESPIRATORY_TRACT | Status: AC
  Administered 2019-10-02 (×2): 6 via RESPIRATORY_TRACT
  Filled 2019-10-02: qty 6.7

## 2019-10-02 MED ORDER — SODIUM CHLORIDE 0.9% FLUSH: 3.0000 mL | Freq: Once | INTRAVENOUS | Status: DC

## 2019-10-02 NOTE — ED Notes (Signed)
ED Provider at bedside. 

## 2019-10-02 NOTE — ED Triage Notes (Signed)
Pt arrives via ems from dr office with c/o of sob for the last 3 days, pt is homeless , aaox4 has 20 left arm had been given 125 solumedrol  im at dr office

## 2019-10-02 NOTE — ED Notes (Signed)
Alert and oriented skin warm and dry.  C/o  everything

## 2019-10-02 NOTE — Discharge Instructions (Addendum)
We saw you in the ER for your breathing related complains. We gave you some breathing treatments in the ER, and seems like your symptoms have improved. Please take albuterol as needed every 4 hours. Please take the medications prescribed. Please refrain from smoking or smoke exposure. Please see a primary care doctor in 1 week. Return to the ER if your symptoms worsen.  We also think it is a good idea for you to see a cardiologist given some nonspecific EKG changes we saw today.  Please return to the ER if you have severe chest pain, shortness of breath, pain radiating to your jaw, shoulder, or back, sweats or fainting. Otherwise see the Cardiologist or your primary care doctor as requested.

## 2019-10-02 NOTE — ED Provider Notes (Addendum)
[facility] Provider Note   CSN: [id number] Arrival date & time: 10/02/19  1146     History   Chief Complaint Chief Complaint  Patient presents with  . Shortness of Breath    HPI Darryl Hurley is a 58 y.o. male.     HPI  58 year old male with history of COPD, diabetes, exertional dyspnea and asthma comes in a chief complaint of shortness of breath.  Reports that he been having shortness of breath for the last 3 days.  He has chronic cough which is unchanged.  He does have some chest discomfort when he coughs.  Today went to his PCP and he was sent to the ER for further evaluation because of symptoms.  He had received some treatments at the facility which helped him.  He thinks that his smoking might have triggered the symptoms.  Review of system is negative for any nausea, vomiting, fevers, chills, diarrhea, body aches.  Patient denies any direct sick contacts.  Past Medical History:  Diagnosis Date  . Alcohol abuse   . Angina pectoris   . Arthritis   . Asthma   . Chronic lower back pain   . COPD (chronic obstructive pulmonary disease) (HCC)    on home O2 2L  . Diabetes mellitus without complication (HCC)   . Exertional shortness of breath   . Gout   . Hypertension   . Seizures [facility](HCC)     Patient Active Problem List   Diagnosis Date Noted  . Lactic acidosis 01/06/2016  . Lactic acid acidosis 01/06/2016  . Alcohol abuse with intoxication (HCC) 01/06/2016  . COPD with acute exacerbation (HCC) 02/02/2014  . HCAP (healthcare-associated pneumonia) 02/02/2014  . Increased anion gap metabolic acidosis 02/02/2014  . Cocaine abuse (HCC) 11/18/2013  . Fever 11/17/2013  . Anemia 11/17/2013  . Hypokalemia 11/17/2013  . Polyarthritis 11/17/2013  . Dehydration 11/17/2013  . Type 2 diabetes mellitus (HCC) 05/14/2013  . Chronic obstructive pulmonary disease (COPD) (HCC) 05/12/2013  . Gout 05/12/2013  . Headache 05/12/2013  . Asthma  05/04/2013  . TOBACCO ABUSE 04/27/2009  . Uncontrolled hypertension 04/27/2009    Past Surgical History:  Procedure Laterality Date  . NO PAST SURGERIES          Home Medications    Prior to Admission medications   Medication Sig Start Date End Date Taking? Authorizing Provider  acetaminophen (TYLENOL) 325 MG tablet Take 2 tablets (650 mg total) by mouth every 6 (six) hours as needed. 05/20/19   Lamptey, Britta MccreedyPhilip O, MD  albuterol (PROVENTIL HFA;VENTOLIN HFA) 108 (90 BASE) MCG/ACT inhaler Inhale 1 puff into the lungs every 6 (six) hours as needed for wheezing or shortness of breath.    [provider]  allopurinol (ZYLOPRIM) 100 MG tablet Take 100 mg by mouth daily.    [provider]  amLODipine (NORVASC) 10 MG tablet Take 10 mg by mouth daily. 08/06/18   [provider]  aspirin EC 81 MG EC tablet Take 1 tablet (81 mg total) by mouth daily. 03/19/19   Patwardhan, Anabel BeneManish J, MD  cloNIDine (CATAPRES) 0.1 MG tablet Take 0.1 mg by mouth 2 (two) times daily. 08/15/18   [provider]  doxycycline (VIBRAMYCIN) 100 MG capsule Take 1 capsule (100 mg total) by mouth 2 (two) times daily. 10/02/19   Derwood KaplanNanavati, Ryken Paschal, MD  FLOVENT HFA 110 MCG/ACT inhaler Inhale 1 puff into the lungs 2 (two) times daily as needed (shortness of breath).  08/06/18  [provider]  gabapentin (NEURONTIN) 300 MG capsule Take 300 mg by mouth 2 (two) times daily. 03/04/19   [provider]  hydrochlorothiazide (HYDRODIURIL) 25 MG tablet Take 25 mg by mouth daily.  07/14/18   [provider]  lisinopril (ZESTRIL) 20 MG tablet Take 20 mg by mouth daily.    [provider]  losartan (COZAAR) 100 MG tablet Take 100 mg by mouth daily.    [provider]  meloxicam (MOBIC) 7.5 MG tablet Take 7.5 mg by mouth daily.    [provider]  metFORMIN (GLUCOPHAGE) 1000 MG tablet Take 1 tablet (1,000 mg total) by mouth 2 (two) times daily with a meal. 01/08/16    Alison Murray, MD  nitroGLYCERIN (NITROSTAT) 0.4 MG SL tablet Place 1 tablet (0.4 mg total) under the tongue every 5 (five) minutes x 3 doses as needed for chest pain. 03/18/19   Patwardhan, Anabel Bene, MD  predniSONE (DELTASONE) 10 MG tablet Take 5 tablets (50 mg total) by mouth daily. 10/02/19   Derwood Kaplan, MD  rosuvastatin (CRESTOR) 40 MG tablet Take 40 mg by mouth daily.    [provider]  tiZANidine (ZANAFLEX) 4 MG capsule Take 4 mg by mouth as needed for muscle spasms.  06/25/18   [provider]  traZODone (DESYREL) 50 MG tablet Take 1 tablet (50 mg total) by mouth at bedtime as needed for sleep. 05/20/19   Lamptey, Britta Mccreedy, MD  labetalol (NORMODYNE) 200 MG tablet Take 1 tablet (200 mg total) by mouth 2 (two) times daily. Patient not taking: Reported on 03/25/2019 02/16/19 05/20/19  Elder Negus, MD    Family History Family History  Problem Relation Age of Onset  . Diabetes type II Mother   . Depression Father   . Suicidality Father   . Asthma Brother     Social History Social History   Tobacco Use  . Smoking status: Current Every Day Smoker    Packs/day: 0.25    Years: 40.00    Pack years: 10.00    Types: Cigarettes  . Smokeless tobacco: Never Used  . Tobacco comment: 05/12/2013 'eased off smoking since last month"  Substance Use Topics  . Alcohol use: Yes    Comment: 4 40 oz beers per day, 1 pint of liqor in a day  . Drug use: No    Types: "Crack" cocaine     Allergies   Nsaids and Toradol [ketorolac tromethamine]   Review of Systems Review of Systems  Constitutional: Positive for activity change. Negative for fever.  Respiratory: Positive for cough, shortness of breath and wheezing.   Cardiovascular: Positive for chest pain.  Gastrointestinal: Negative for nausea and vomiting.  Allergic/Immunologic: Negative for immunocompromised state.  All other systems reviewed and are negative.    Physical Exam Updated Vital Signs BP (!)  141/97   Pulse 98   Temp 98.5 F (36.9 C)   Resp (!) 22   SpO2 96%   Physical Exam Vitals signs and nursing note reviewed.  Constitutional:      Appearance: He is well-developed.  HENT:     Head: Normocephalic and atraumatic.  Eyes:     Conjunctiva/sclera: Conjunctivae normal.     Pupils: Pupils are equal, round, and reactive to light.  Neck:     Musculoskeletal: Normal range of motion and neck supple.  Cardiovascular:     Rate and Rhythm: Normal rate and regular rhythm.  Pulmonary:     Effort: Pulmonary effort is  normal.     Breath sounds: Examination of the right-upper field reveals wheezing and rhonchi. Examination of the left-upper field reveals wheezing. Examination of the right-middle field reveals wheezing and rhonchi. Examination of the left-middle field reveals wheezing. Examination of the right-lower field reveals wheezing and rhonchi. Examination of the left-lower field reveals wheezing. Wheezing and rhonchi present. No rales.  Abdominal:     General: Bowel sounds are normal. There is no distension.     Palpations: Abdomen is soft. There is no mass.     Tenderness: There is no abdominal tenderness. There is no guarding or rebound.  Musculoskeletal:        General: No deformity.     Right lower leg: No edema.     Left lower leg: No edema.  Skin:    General: Skin is warm.  Neurological:     Mental Status: He is alert and oriented to person, place, and time.      ED Treatments / Results  Labs (all labs ordered are listed, but only abnormal results are displayed) Labs Reviewed  CBC - Abnormal; Notable for the following components:      Result Value   WBC 11.8 (*)    RDW 15.6 (*)    All other components within normal limits  COMPREHENSIVE METABOLIC PANEL - Abnormal; Notable for the following components:   CO2 21 (*)    Glucose, Bld 163 (*)    Creatinine, Ser 1.44 (*)    Total Bilirubin 1.4 (*)    GFR calc non Af Amer 54 (*)    All other components within  normal limits  SARS CORONAVIRUS 2 (TAT 6-24 HRS)    EKG EKG Interpretation  Date/Time:  Friday October 02 2019 12:04:18 EDT Ventricular Rate:  102 PR Interval:  132 QRS Duration: 86 QT Interval:  340 QTC Calculation: 443 R Axis:   48 Text Interpretation: Sinus tachycardia Possible Left atrial enlargement Nonspecific T wave abnormality Abnormal ECG st elevation in anterior leads, likely j point inferior and lateral TWI is new Confirmed by Derwood Kaplan [phone]) on 10/02/2019 6:50:48 PM   Radiology Dg Chest 2 View  Result Date: 10/02/2019 CLINICAL DATA:  Cough, chest congestion and shortness of breath. EXAM: CHEST - 2 VIEW COMPARISON:  09/10/2019 FINDINGS: Poor inspiration. Normal sized heart. Clear lungs. Mild diffuse peribronchial thickening without significant change. Unremarkable bones. IMPRESSION: Stable mild chronic bronchitic changes. No acute abnormality. Electronically Signed   By: Beckie Salts M.D.   On: 10/02/2019 12:40    Procedures .Critical Care Performed by: Derwood Kaplan, MD Authorized by: Derwood Kaplan, MD   Critical care provider statement:    Critical care time (minutes):  32   Critical care was necessary to treat or prevent imminent or life-threatening deterioration of the following conditions:  Respiratory failure   Critical care was time spent personally by me on the following activities:  Discussions with consultants, evaluation of patient's response to treatment, examination of patient, ordering and performing treatments and interventions, ordering and review of laboratory studies, ordering and review of radiographic studies, pulse oximetry, re-evaluation of patient's condition, obtaining history from patient or surrogate and review of old charts   (including critical care time)  Medications Ordered in ED Medications  sodium chloride flush (NS) 0.9 % injection 3 mL ( Intravenous Canceled Entry 10/02/19 1825)  albuterol (VENTOLIN HFA) 108 (90 Base)  MCG/ACT inhaler 6 puff (6 puffs Inhalation Given 10/02/19 1921)  dexamethasone (DECADRON) injection 10 mg (10 mg Intravenous Given 10/02/19  1839)  doxycycline (VIBRA-TABS) tablet 100 mg (100 mg Oral Given 10/02/19 2011)     Initial Impression / Assessment and Plan / ED Course  I have reviewed the triage vital signs and the nursing notes.  Pertinent labs & imaging results that were available during my care of the patient were reviewed by me and considered in my medical decision making (see chart for details).  Clinical Course as of Oct 02 2023  Ludwig Clarks Oct 02, 2019  2012 Results of the ED work-up discussed with the patient.  He is feeling little better after the breathing treatments.  On exam, the wheezing has mostly cleared.  I discussed with him that his EKG has some nonspecific changes and that is prudent for him to follow-up with cardiology.  He is in agreement with the plan.  At the moment he has not been having any chest pain, just shortness of breath because of wheezing.  Strict ER return precautions have been discussed, and patient is agreeing with the plan and is comfortable with the workup done and the recommendations from the ER.    [AN]    Clinical Course User Index [AN] Varney Biles, MD       58 year old with history of COPD/asthma, diabetes comes in with chief complaint of shortness of breath.  Is been having shortness of breath for the last 3 days with nonproductive cough.  Review of system is negative for any other patient viral syndrome-like prodrome.  Concerns are that mainly patient is likely having COPD exacerbation.  On exam he did have rhonchi that was worse on the right side compared to left and he has some pleuritic chest pain.  X-ray ordered to rule out walking pneumonia.  We will likely put him on doxycycline if his x-ray is clear given the exam findings.  Also get an outpatient Covid test if patient is stable for discharge.  Darryl Hurley was evaluated in  Emergency Department on 10/02/2019 for the symptoms described in the history of present illness. He was evaluated in the context of the global COVID-19 pandemic, which necessitated consideration that the patient might be at risk for infection with the SARS-CoV-2 virus that causes COVID-19. Institutional protocols and algorithms that pertain to the evaluation of patients at risk for COVID-19 are in a state of rapid change based on information released by regulatory bodies including the CDC and federal and state organizations. These policies and algorithms were followed during the patient's care in the ED.   Final Clinical Impressions(s) / ED Diagnoses   Final diagnoses:  COPD exacerbation [facility])    ED Discharge Orders         Ordered    predniSONE (DELTASONE) 10 MG tablet  Daily     10/02/19 2023    doxycycline (VIBRAMYCIN) 100 MG capsule  2 times daily     10/02/19 2023           Varney Biles, MD 10/02/19 1839    Varney Biles, MD 10/02/19 2024

## 2019-10-03 LAB — SARS CORONAVIRUS 2 (TAT 6-24 HRS): SARS Coronavirus 2: NEGATIVE

## 2019-10-25 ENCOUNTER — Emergency Department (HOSPITAL_COMMUNITY)
Admission: EM | Admit: 2019-10-25 | Discharge: 2019-10-25 | Disposition: A | Discharge status: Home / Self Care | Payer: Medicaid Other | Attending: Emergency Medicine | Admitting: Emergency Medicine

## 2019-10-25 ENCOUNTER — Encounter (HOSPITAL_COMMUNITY): Payer: Self-pay | Admitting: Emergency Medicine

## 2019-10-25 ENCOUNTER — Other Ambulatory Visit: Payer: Self-pay

## 2019-10-25 DIAGNOSIS — Z5321 Procedure and treatment not carried out due to patient leaving prior to being seen by health care provider: Secondary | ICD-10-CM | POA: Diagnosis not present

## 2019-10-25 DIAGNOSIS — F10929 Alcohol use, unspecified with intoxication, unspecified: Secondary | ICD-10-CM | POA: Diagnosis present

## 2019-10-25 LAB — COMPREHENSIVE METABOLIC PANEL
ALT: 14 U/L (ref 0–44)
AST: 26 U/L (ref 15–41)
Albumin: 3.7 g/dL (ref 3.5–5.0)
Alkaline Phosphatase: 70 U/L (ref 38–126)
Anion gap: 15 (ref 5–15)
BUN: 19 mg/dL (ref 6–20)
CO2: 20 mmol/L — ABNORMAL LOW (ref 22–32)
Calcium: 9.7 mg/dL (ref 8.9–10.3)
Chloride: 102 mmol/L (ref 98–111)
Creatinine, Ser: 1.45 mg/dL — ABNORMAL HIGH (ref 0.61–1.24)
GFR calc Af Amer: >60 mL/min (ref >60)
GFR calc non Af Amer: 53 mL/min — ABNORMAL LOW (ref >60)
Glucose, Bld: 109 mg/dL — ABNORMAL HIGH (ref 70–99)
Potassium: 3.9 mmol/L (ref 3.5–5.1)
Sodium: 137 mmol/L (ref 135–145)
Total Bilirubin: 0.6 mg/dL (ref 0.3–1.2)
Total Protein: 8 g/dL (ref 6.5–8.1)

## 2019-10-25 LAB — CBC WITH DIFFERENTIAL/PLATELET
Abs Immature Granulocytes: 0.03 10*3/uL (ref 0.00–0.07)
Basophils Absolute: 0.1 10*3/uL (ref 0.0–0.1)
Basophils Relative: 1 %
Eosinophils Absolute: 0.1 10*3/uL (ref 0.0–0.5)
Eosinophils Relative: 1 %
HCT: 46.2 % (ref 39.0–52.0)
Hemoglobin: 15.2 g/dL (ref 13.0–17.0)
Immature Granulocytes: 0 %
Lymphocytes Relative: 23 %
Lymphs Abs: 1.8 10*3/uL (ref 0.7–4.0)
MCH: 30.6 pg (ref 26.0–34.0)
MCHC: 32.9 g/dL (ref 30.0–36.0)
MCV: 93 fL (ref 80.0–100.0)
Monocytes Absolute: 0.7 10*3/uL (ref 0.1–1.0)
Monocytes Relative: 9 %
Neutro Abs: 5 10*3/uL (ref 1.7–7.7)
Neutrophils Relative %: 66 %
Platelets: 438 10*3/uL — ABNORMAL HIGH (ref 150–400)
RBC: 4.97 MIL/uL (ref 4.22–5.81)
RDW: 15.9 % — ABNORMAL HIGH (ref 11.5–15.5)
WBC: 7.5 10*3/uL (ref 4.0–10.5)
nRBC: 0 % (ref 0.0–0.2)

## 2019-10-25 LAB — ETHANOL: Alcohol, Ethyl (B): 264 mg/dL — ABNORMAL HIGH (ref <10)

## 2019-10-25 NOTE — ED Notes (Signed)
Patient was seen walking out the door by staff.

## 2019-10-25 NOTE — ED Notes (Signed)
Pt wandered in triage and kept asking staff if he could go home. Explained to pt that he needed to see a doctor before going home.

## 2019-10-25 NOTE — ED Triage Notes (Signed)
Patient arrived with EMS wearing C- collar  fell on the driveway this evening intoxicate with ETOH , CBG= 101 , poor historian at triage due to intoxication . Respirations unlabored .

## 2019-11-18 ENCOUNTER — Other Ambulatory Visit: Payer: Self-pay

## 2019-11-18 ENCOUNTER — Encounter (HOSPITAL_COMMUNITY): Payer: Self-pay | Admitting: *Deleted

## 2019-11-18 ENCOUNTER — Inpatient Hospital Stay (HOSPITAL_COMMUNITY)
Admission: EM | Admit: 2019-11-18 | Discharge: 2019-11-23 | DRG: 854 | Disposition: A | Discharge status: Home Health | Payer: Medicaid Other | Attending: Internal Medicine | Admitting: Internal Medicine

## 2019-11-18 DIAGNOSIS — Z7982 Long term (current) use of aspirin: Secondary | ICD-10-CM

## 2019-11-18 DIAGNOSIS — Z9981 Dependence on supplemental oxygen: Secondary | ICD-10-CM

## 2019-11-18 DIAGNOSIS — J449 Chronic obstructive pulmonary disease, unspecified: Secondary | ICD-10-CM | POA: Diagnosis present

## 2019-11-18 DIAGNOSIS — E1142 Type 2 diabetes mellitus with diabetic polyneuropathy: Secondary | ICD-10-CM

## 2019-11-18 DIAGNOSIS — E119 Type 2 diabetes mellitus without complications: Secondary | ICD-10-CM

## 2019-11-18 DIAGNOSIS — M130 Polyarthritis, unspecified: Secondary | ICD-10-CM

## 2019-11-18 DIAGNOSIS — I129 Hypertensive chronic kidney disease with stage 1 through stage 4 chronic kidney disease, or unspecified chronic kidney disease: Secondary | ICD-10-CM | POA: Diagnosis present

## 2019-11-18 DIAGNOSIS — G8929 Other chronic pain: Secondary | ICD-10-CM | POA: Diagnosis present

## 2019-11-18 DIAGNOSIS — Z833 Family history of diabetes mellitus: Secondary | ICD-10-CM

## 2019-11-18 DIAGNOSIS — E1165 Type 2 diabetes mellitus with hyperglycemia: Secondary | ICD-10-CM

## 2019-11-18 DIAGNOSIS — E785 Hyperlipidemia, unspecified: Secondary | ICD-10-CM | POA: Diagnosis present

## 2019-11-18 DIAGNOSIS — A419 Sepsis, unspecified organism: Principal | ICD-10-CM | POA: Diagnosis present

## 2019-11-18 DIAGNOSIS — E871 Hypo-osmolality and hyponatremia: Secondary | ICD-10-CM | POA: Diagnosis present

## 2019-11-18 DIAGNOSIS — F1010 Alcohol abuse, uncomplicated: Secondary | ICD-10-CM | POA: Diagnosis present

## 2019-11-18 DIAGNOSIS — Z888 Allergy status to other drugs, medicaments and biological substances status: Secondary | ICD-10-CM

## 2019-11-18 DIAGNOSIS — M545 Low back pain: Secondary | ICD-10-CM | POA: Diagnosis present

## 2019-11-18 DIAGNOSIS — N183 Chronic kidney disease, stage 3 unspecified: Secondary | ICD-10-CM | POA: Diagnosis present

## 2019-11-18 DIAGNOSIS — R079 Chest pain, unspecified: Secondary | ICD-10-CM

## 2019-11-18 DIAGNOSIS — J9611 Chronic respiratory failure with hypoxia: Secondary | ICD-10-CM | POA: Diagnosis present

## 2019-11-18 DIAGNOSIS — I10 Essential (primary) hypertension: Secondary | ICD-10-CM | POA: Diagnosis present

## 2019-11-18 DIAGNOSIS — F17210 Nicotine dependence, cigarettes, uncomplicated: Secondary | ICD-10-CM | POA: Diagnosis present

## 2019-11-18 DIAGNOSIS — M109 Gout, unspecified: Secondary | ICD-10-CM

## 2019-11-18 DIAGNOSIS — Z825 Family history of asthma and other chronic lower respiratory diseases: Secondary | ICD-10-CM

## 2019-11-18 DIAGNOSIS — Z20828 Contact with and (suspected) exposure to other viral communicable diseases: Secondary | ICD-10-CM | POA: Diagnosis present

## 2019-11-18 DIAGNOSIS — R0789 Other chest pain: Secondary | ICD-10-CM | POA: Diagnosis present

## 2019-11-18 DIAGNOSIS — Z79899 Other long term (current) drug therapy: Secondary | ICD-10-CM

## 2019-11-18 DIAGNOSIS — Z7984 Long term (current) use of oral hypoglycemic drugs: Secondary | ICD-10-CM

## 2019-11-18 DIAGNOSIS — Z7952 Long term (current) use of systemic steroids: Secondary | ICD-10-CM

## 2019-11-18 DIAGNOSIS — M009 Pyogenic arthritis, unspecified: Secondary | ICD-10-CM | POA: Diagnosis present

## 2019-11-18 NOTE — ED Triage Notes (Signed)
Pt via [facility] arrives with c/o bilateral leg pain d/t gout. Unable to stand d/t pain. 160/92, hr 119, 94% RA, 17 R .

## 2019-11-18 NOTE — ED Triage Notes (Signed)
Pt says all of his gout is hurting him, taking his gout pills without relief.

## 2019-11-19 ENCOUNTER — Emergency Department (HOSPITAL_COMMUNITY): Payer: Medicaid Other

## 2019-11-19 ENCOUNTER — Inpatient Hospital Stay (HOSPITAL_COMMUNITY): Payer: Medicaid Other | Admitting: Certified Registered Nurse Anesthetist

## 2019-11-19 ENCOUNTER — Encounter (HOSPITAL_COMMUNITY)
Admission: EM | Disposition: A | Discharge status: Home Health | Payer: Self-pay | Source: Home / Self Care | Attending: Internal Medicine

## 2019-11-19 ENCOUNTER — Inpatient Hospital Stay (HOSPITAL_COMMUNITY): Payer: Medicaid Other

## 2019-11-19 DIAGNOSIS — J449 Chronic obstructive pulmonary disease, unspecified: Secondary | ICD-10-CM

## 2019-11-19 DIAGNOSIS — A419 Sepsis, unspecified organism: Secondary | ICD-10-CM | POA: Diagnosis present

## 2019-11-19 DIAGNOSIS — Z9981 Dependence on supplemental oxygen: Secondary | ICD-10-CM | POA: Diagnosis not present

## 2019-11-19 DIAGNOSIS — Z7952 Long term (current) use of systemic steroids: Secondary | ICD-10-CM | POA: Diagnosis not present

## 2019-11-19 DIAGNOSIS — I129 Hypertensive chronic kidney disease with stage 1 through stage 4 chronic kidney disease, or unspecified chronic kidney disease: Secondary | ICD-10-CM | POA: Diagnosis present

## 2019-11-19 DIAGNOSIS — E871 Hypo-osmolality and hyponatremia: Secondary | ICD-10-CM | POA: Diagnosis present

## 2019-11-19 DIAGNOSIS — N183 Chronic kidney disease, stage 3 unspecified: Secondary | ICD-10-CM | POA: Diagnosis present

## 2019-11-19 DIAGNOSIS — M130 Polyarthritis, unspecified: Secondary | ICD-10-CM | POA: Diagnosis present

## 2019-11-19 DIAGNOSIS — Z825 Family history of asthma and other chronic lower respiratory diseases: Secondary | ICD-10-CM | POA: Diagnosis not present

## 2019-11-19 DIAGNOSIS — M009 Pyogenic arthritis, unspecified: Secondary | ICD-10-CM | POA: Diagnosis present

## 2019-11-19 DIAGNOSIS — M545 Low back pain: Secondary | ICD-10-CM | POA: Diagnosis present

## 2019-11-19 DIAGNOSIS — E1165 Type 2 diabetes mellitus with hyperglycemia: Secondary | ICD-10-CM | POA: Diagnosis present

## 2019-11-19 DIAGNOSIS — M109 Gout, unspecified: Secondary | ICD-10-CM

## 2019-11-19 DIAGNOSIS — Z79899 Other long term (current) drug therapy: Secondary | ICD-10-CM | POA: Diagnosis not present

## 2019-11-19 DIAGNOSIS — R0789 Other chest pain: Secondary | ICD-10-CM | POA: Diagnosis present

## 2019-11-19 DIAGNOSIS — E785 Hyperlipidemia, unspecified: Secondary | ICD-10-CM | POA: Diagnosis present

## 2019-11-19 DIAGNOSIS — F17210 Nicotine dependence, cigarettes, uncomplicated: Secondary | ICD-10-CM | POA: Diagnosis present

## 2019-11-19 DIAGNOSIS — M2550 Pain in unspecified joint: Secondary | ICD-10-CM | POA: Diagnosis present

## 2019-11-19 DIAGNOSIS — F1010 Alcohol abuse, uncomplicated: Secondary | ICD-10-CM | POA: Diagnosis present

## 2019-11-19 DIAGNOSIS — Z7982 Long term (current) use of aspirin: Secondary | ICD-10-CM | POA: Diagnosis not present

## 2019-11-19 DIAGNOSIS — G8929 Other chronic pain: Secondary | ICD-10-CM | POA: Diagnosis present

## 2019-11-19 DIAGNOSIS — Z833 Family history of diabetes mellitus: Secondary | ICD-10-CM | POA: Diagnosis not present

## 2019-11-19 DIAGNOSIS — J9611 Chronic respiratory failure with hypoxia: Secondary | ICD-10-CM | POA: Diagnosis present

## 2019-11-19 DIAGNOSIS — Z7984 Long term (current) use of oral hypoglycemic drugs: Secondary | ICD-10-CM | POA: Diagnosis not present

## 2019-11-19 DIAGNOSIS — I10 Essential (primary) hypertension: Secondary | ICD-10-CM | POA: Diagnosis not present

## 2019-11-19 DIAGNOSIS — Z20828 Contact with and (suspected) exposure to other viral communicable diseases: Secondary | ICD-10-CM | POA: Diagnosis present

## 2019-11-19 HISTORY — PX: INCISION AND DRAINAGE ABSCESS: SHX5864

## 2019-11-19 LAB — CBC WITH DIFFERENTIAL/PLATELET
Abs Immature Granulocytes: 0.09 10*3/uL — ABNORMAL HIGH (ref 0.00–0.07)
Basophils Absolute: 0 10*3/uL (ref 0.0–0.1)
Basophils Relative: 0 %
Eosinophils Absolute: 0 10*3/uL (ref 0.0–0.5)
Eosinophils Relative: 0 %
HCT: 44.6 % (ref 39.0–52.0)
Hemoglobin: 14.9 g/dL (ref 13.0–17.0)
Immature Granulocytes: 1 %
Lymphocytes Relative: 10 %
Lymphs Abs: 1.2 10*3/uL (ref 0.7–4.0)
MCH: 31 pg (ref 26.0–34.0)
MCHC: 33.4 g/dL (ref 30.0–36.0)
MCV: 92.9 fL (ref 80.0–100.0)
Monocytes Absolute: 1.7 10*3/uL — ABNORMAL HIGH (ref 0.1–1.0)
Monocytes Relative: 15 %
Neutro Abs: 8.9 10*3/uL — ABNORMAL HIGH (ref 1.7–7.7)
Neutrophils Relative %: 74 %
Platelets: 334 10*3/uL (ref 150–400)
RBC: 4.8 MIL/uL (ref 4.22–5.81)
RDW: 16.2 % — ABNORMAL HIGH (ref 11.5–15.5)
WBC: 11.9 10*3/uL — ABNORMAL HIGH (ref 4.0–10.5)
nRBC: 0 % (ref 0.0–0.2)

## 2019-11-19 LAB — COMPREHENSIVE METABOLIC PANEL
ALT: 14 U/L (ref 0–44)
AST: 18 U/L (ref 15–41)
Albumin: 2.7 g/dL — ABNORMAL LOW (ref 3.5–5.0)
Alkaline Phosphatase: 61 U/L (ref 38–126)
Anion gap: 13 (ref 5–15)
BUN: 19 mg/dL (ref 6–20)
CO2: 23 mmol/L (ref 22–32)
Calcium: 9 mg/dL (ref 8.9–10.3)
Chloride: 99 mmol/L (ref 98–111)
Creatinine, Ser: 1.94 mg/dL — ABNORMAL HIGH (ref 0.61–1.24)
GFR calc Af Amer: 43 mL/min — ABNORMAL LOW (ref >60)
GFR calc non Af Amer: 37 mL/min — ABNORMAL LOW (ref >60)
Glucose, Bld: 166 mg/dL — ABNORMAL HIGH (ref 70–99)
Potassium: 3.8 mmol/L (ref 3.5–5.1)
Sodium: 135 mmol/L (ref 135–145)
Total Bilirubin: 1.3 mg/dL — ABNORMAL HIGH (ref 0.3–1.2)
Total Protein: 7 g/dL (ref 6.5–8.1)

## 2019-11-19 LAB — RESPIRATORY PANEL BY RT PCR (FLU A&B, COVID)
Influenza A by PCR: NEGATIVE
Influenza B by PCR: NEGATIVE
SARS Coronavirus 2 by RT PCR: NEGATIVE

## 2019-11-19 LAB — SYNOVIAL CELL COUNT + DIFF, W/ CRYSTALS
Lymphocytes-Synovial Fld: 6 % (ref 0–20)
Monocyte-Macrophage-Synovial Fluid: 1 % — ABNORMAL LOW (ref 50–90)
Neutrophil, Synovial: 93 % — ABNORMAL HIGH (ref 0–25)
WBC, Synovial: 431000 /mm3 — ABNORMAL HIGH (ref 0–200)

## 2019-11-19 LAB — C-REACTIVE PROTEIN: CRP: 28 mg/dL — ABNORMAL HIGH (ref <1.0)

## 2019-11-19 LAB — SEDIMENTATION RATE: Sed Rate: 95 mm/hr — ABNORMAL HIGH (ref 0–16)

## 2019-11-19 LAB — BASIC METABOLIC PANEL
Anion gap: 14 (ref 5–15)
BUN: 15 mg/dL (ref 6–20)
CO2: 20 mmol/L — ABNORMAL LOW (ref 22–32)
Calcium: 9.4 mg/dL (ref 8.9–10.3)
Chloride: 98 mmol/L (ref 98–111)
Creatinine, Ser: 1.4 mg/dL — ABNORMAL HIGH (ref 0.61–1.24)
GFR calc Af Amer: >60 mL/min (ref >60)
GFR calc non Af Amer: 55 mL/min — ABNORMAL LOW (ref >60)
Glucose, Bld: 170 mg/dL — ABNORMAL HIGH (ref 70–99)
Potassium: 3.9 mmol/L (ref 3.5–5.1)
Sodium: 132 mmol/L — ABNORMAL LOW (ref 135–145)

## 2019-11-19 LAB — LACTIC ACID, PLASMA
Lactic Acid, Venous: 1.1 mmol/L (ref 0.5–1.9)
Lactic Acid, Venous: 1.2 mmol/L (ref 0.5–1.9)

## 2019-11-19 LAB — GLUCOSE, CAPILLARY: Glucose-Capillary: 179 mg/dL — ABNORMAL HIGH (ref 70–99)

## 2019-11-19 LAB — TROPONIN I (HIGH SENSITIVITY)
Troponin I (High Sensitivity): 10 ng/L (ref <18)
Troponin I (High Sensitivity): 8 ng/L (ref <18)

## 2019-11-19 LAB — URIC ACID: Uric Acid, Serum: 10.1 mg/dL — ABNORMAL HIGH (ref 3.7–8.6)

## 2019-11-19 IMAGING — DX DG KNEE 1-2V*L*
2 series · 2 of 2 positions shown · non-contrast
Comparison: [DATE]

CLINICAL DATA: Pain and swelling

EXAM:
LEFT KNEE - 1-2 VIEW

[x knee ap left]
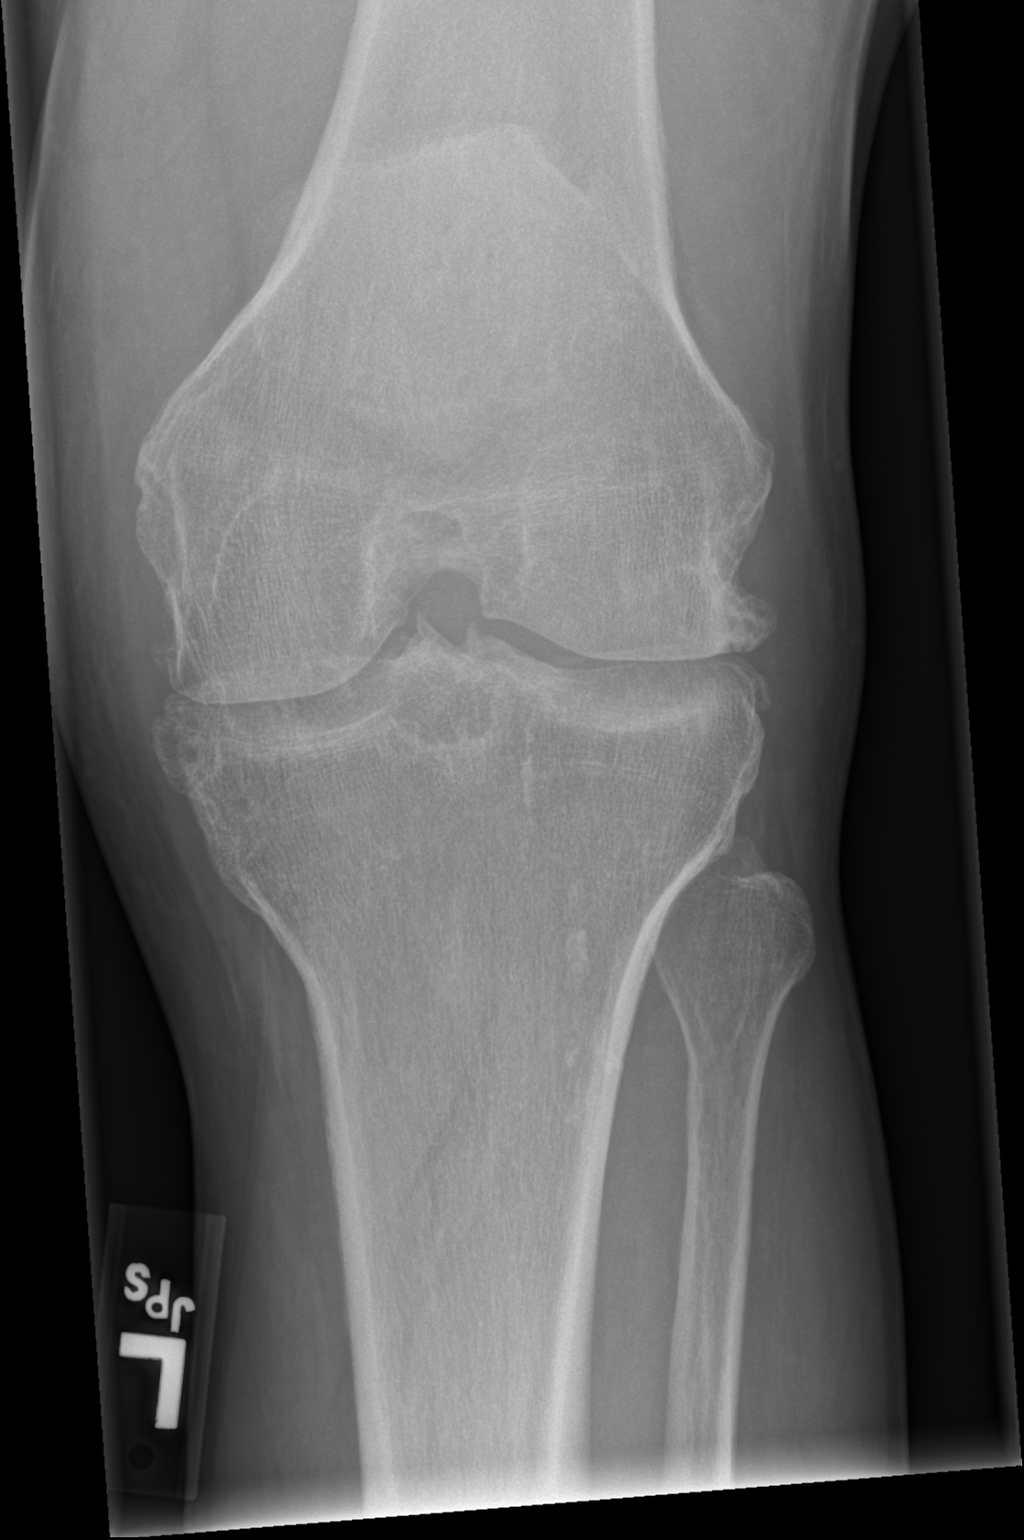

[x knee lat left]
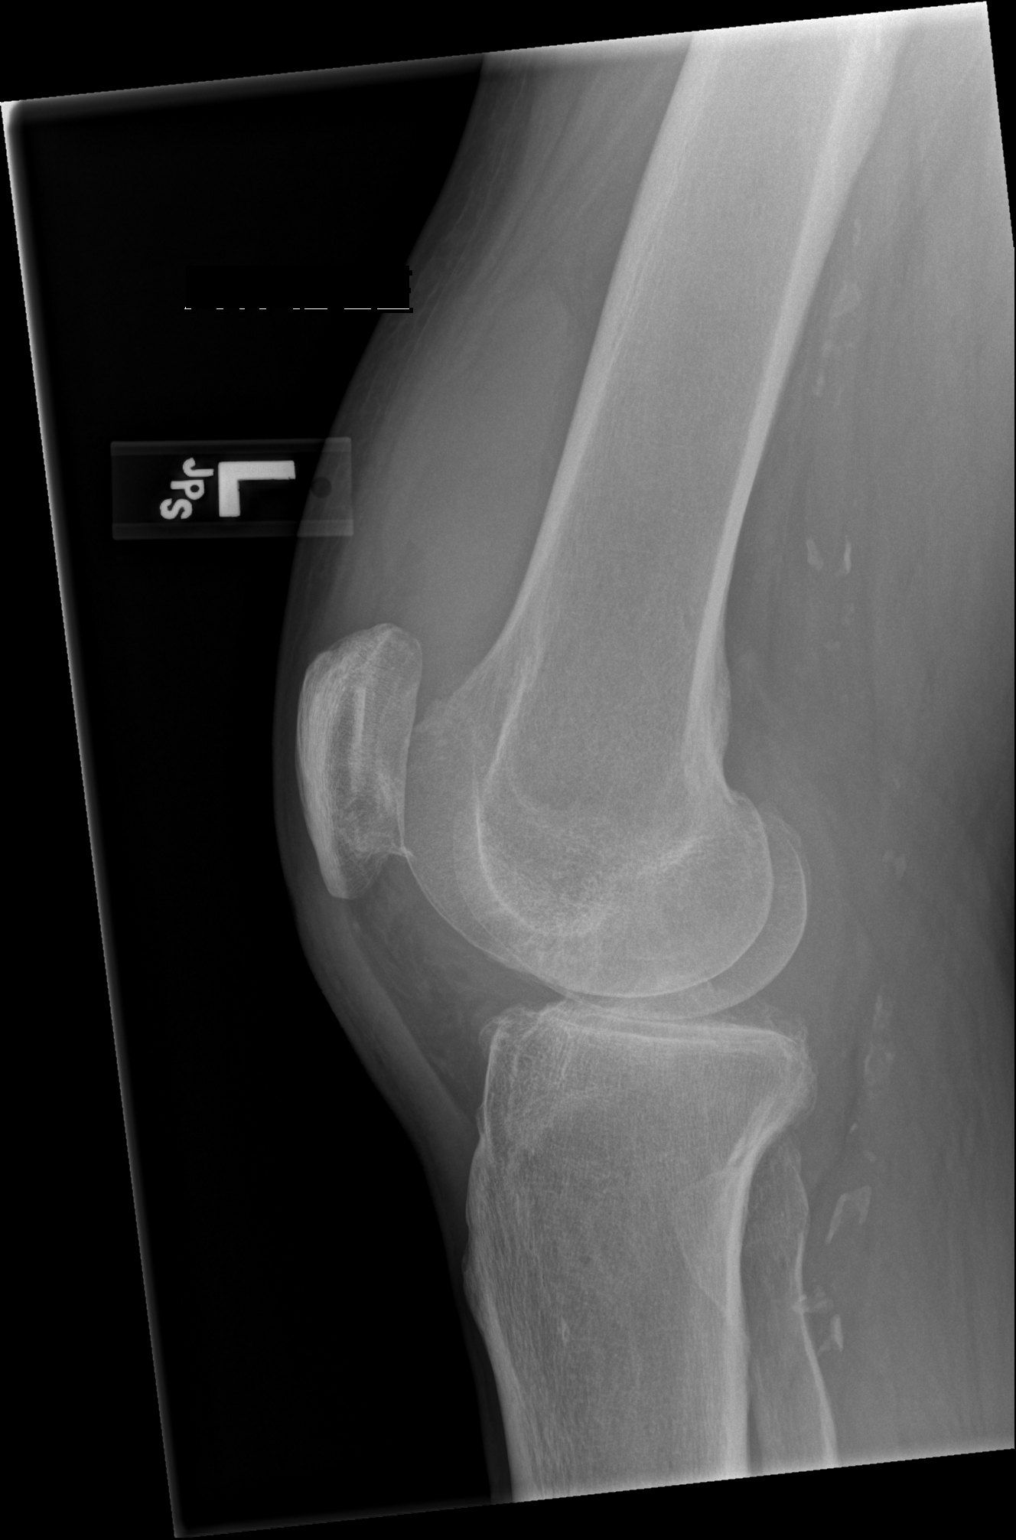

[2 of 2 positions shown; findings below may reference images not displayed]

FINDINGS: Frontal and lateral views were obtained. No fracture or dislocation.
There is a large joint effusion. There is moderate generalized joint
space narrowing with slight intra-articular calcification medially.
There is spurring in all compartments. No erosive change. There are
foci of calcification in the superficial femoral and popliteal
arteries.
IMPRESSION: 1.  Large joint effusion.  No fracture or dislocation.

2.  Generalized osteoarthritic change, similar to prior study.

3.  Multiple foci of arterial vascular calcification.

## 2019-11-19 IMAGING — DX DG KNEE 1-2V*R*
2 series · 2 of 2 positions shown · non-contrast
Comparison: [DATE]

CLINICAL DATA: Pain and swelling

EXAM:
RIGHT KNEE - 1-2 VIEW

[x knee ap right]
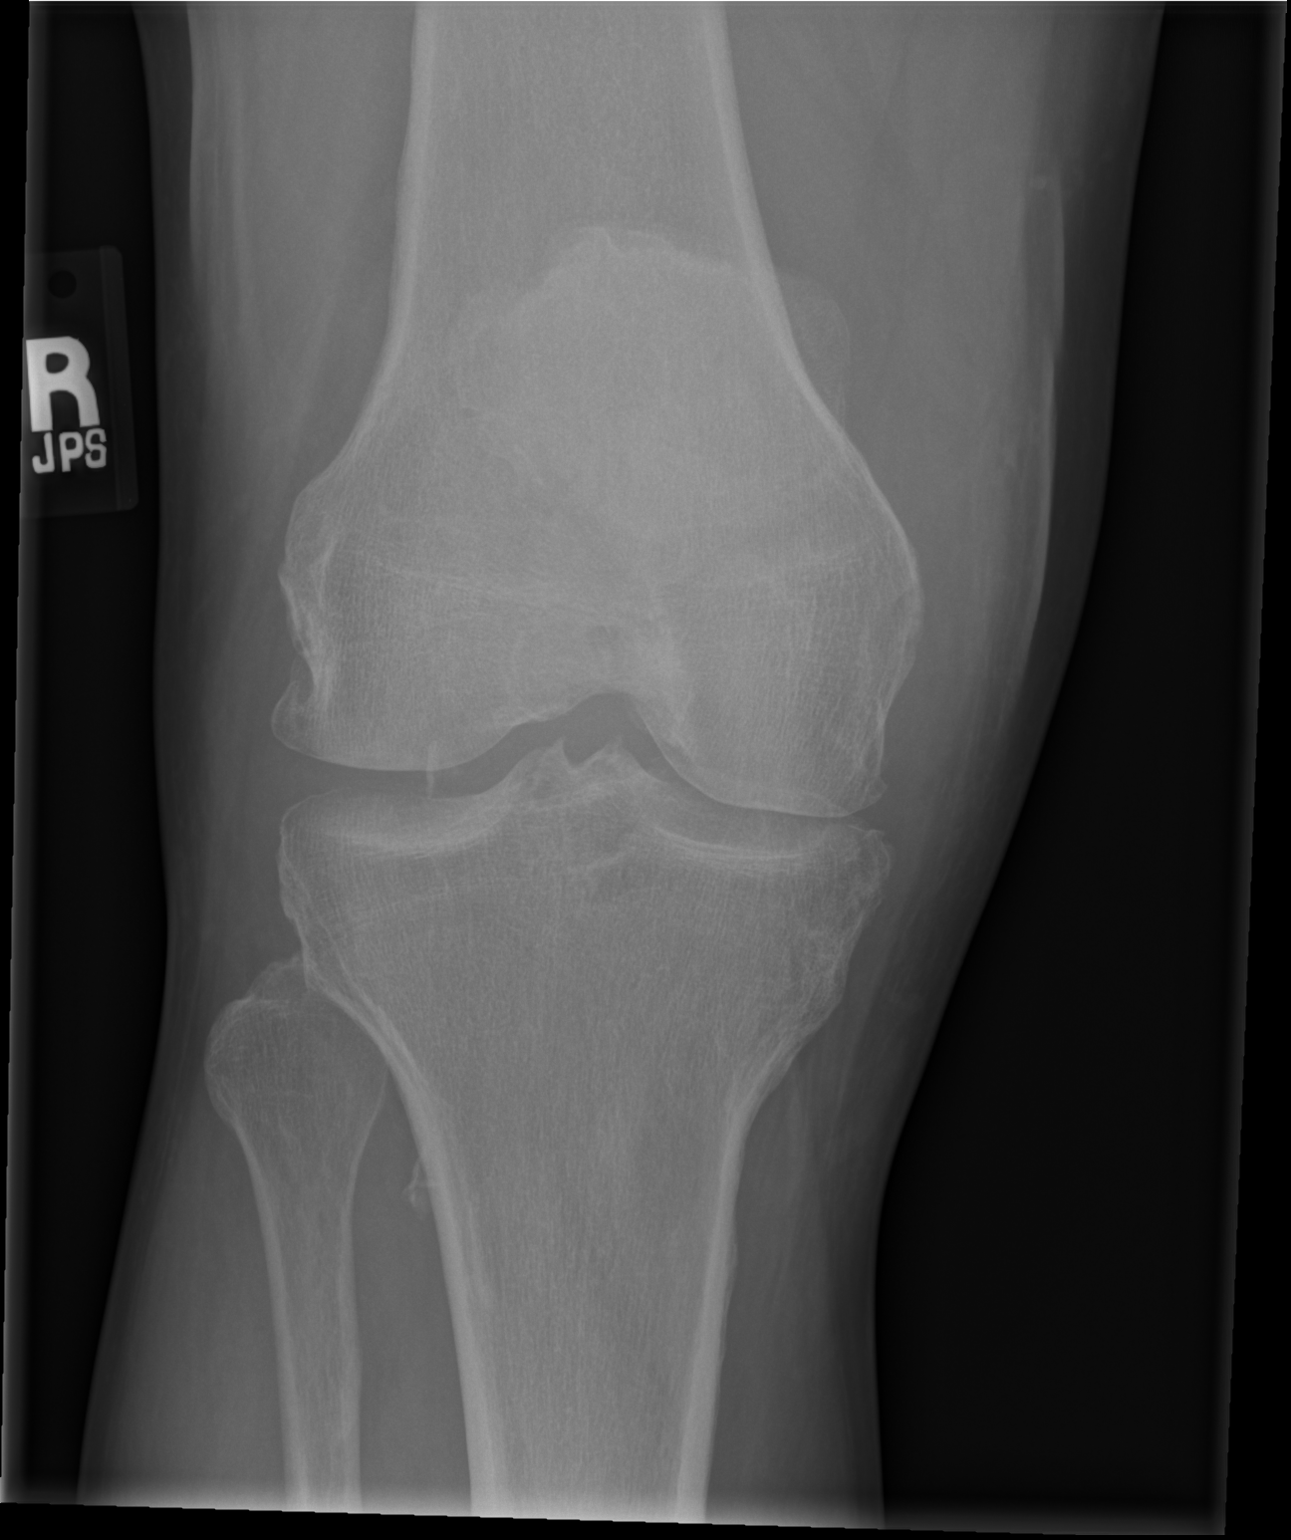

[x knee lat right]
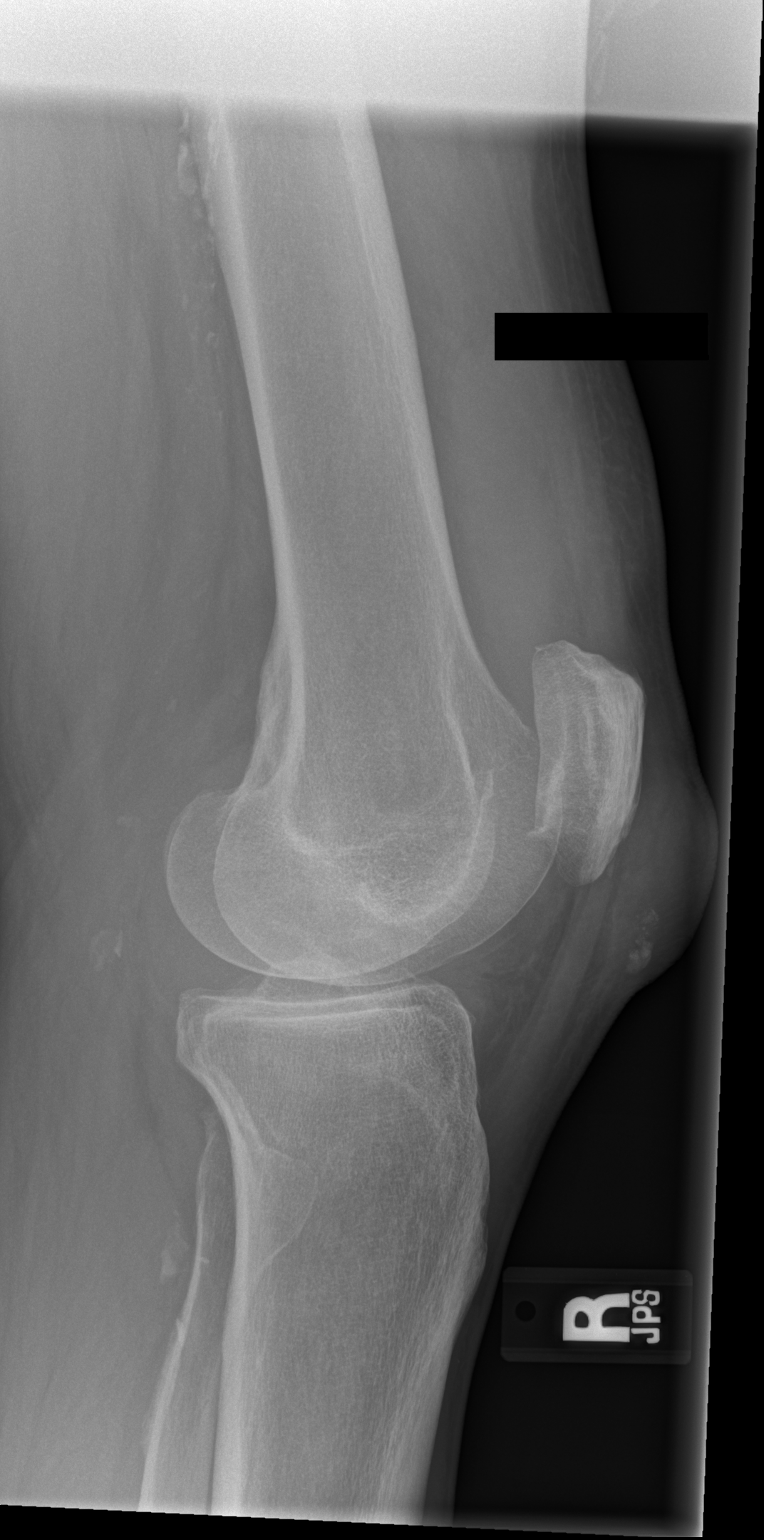

[2 of 2 positions shown; findings below may reference images not displayed]

FINDINGS: Frontal and lateral views were obtained. No fracture or dislocation.
There is a large joint effusion. There is relatively mild narrowing
medially and in the patellofemoral joint region. There is mild
spurring in all compartments.

There is prepatellar soft tissue swelling with calcification in this
area inferiorly, a finding also present previously.

There are foci of arterial vascular calcification in the superficial
femoral artery and popliteal artery regions.
IMPRESSION: 1.  Large joint effusion.  No acute fracture or dislocation.

2. Prepatellar soft tissue swelling with calcification inferiorly in
this area. Question residua of prior hematoma versus prepatellar
inflammatory lesion. Clinical assessment of this area advised.

3. Osteoarthritic change, primarily medially and in the
patellofemoral joint.

4.  Foci of arterial vascular calcification.

## 2019-11-19 IMAGING — DX DG CHEST 1V PORT
1 series · 1 of 1 positions shown · non-contrast
Comparison: [DATE]

CLINICAL DATA: Chest pain

EXAM:
PORTABLE CHEST 1 VIEW

[chest ap]
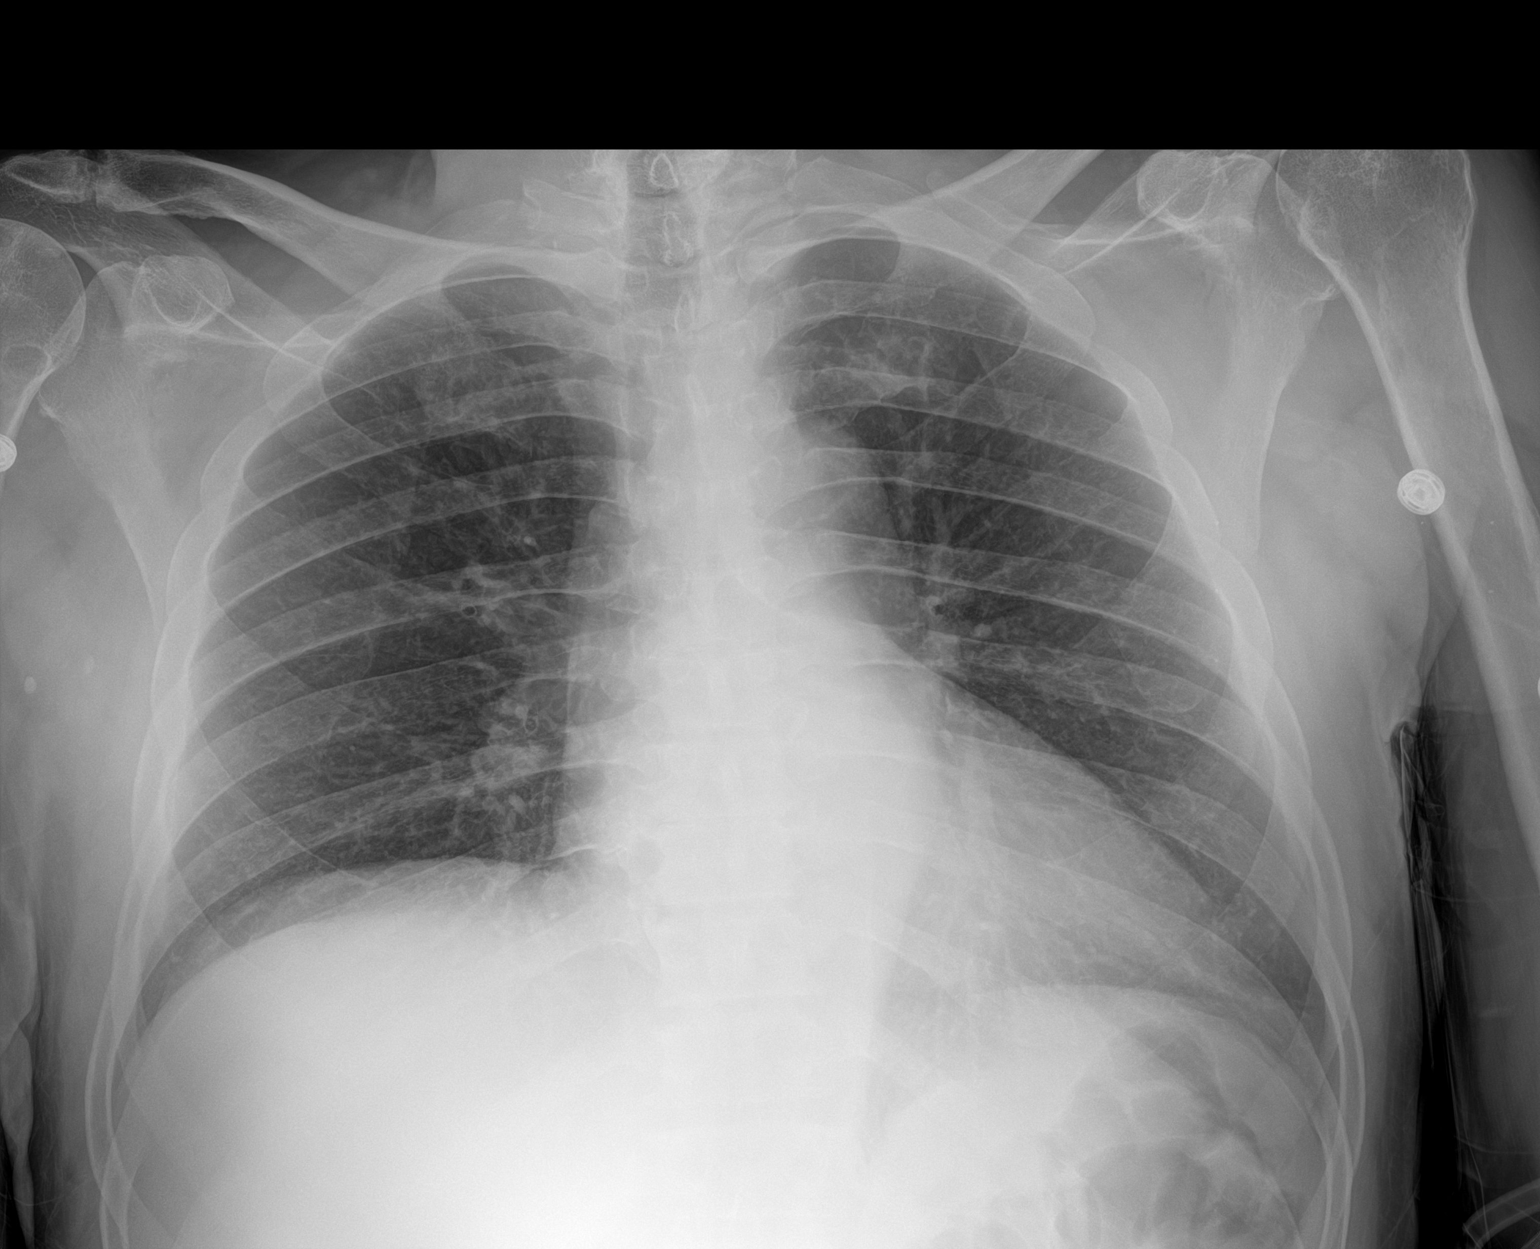

[1 of 1 positions shown; findings below may reference images not displayed]

FINDINGS: Normal heart size and stable aortic contours. Stable low volume
chest. There is no edema, consolidation, effusion, or pneumothorax.
IMPRESSION: No evidence of active disease.

## 2019-11-19 SURGERY — INCISION AND DRAINAGE, ABSCESS
Anesthesia: General | Site: Knee | Laterality: Bilateral

## 2019-11-19 MED ORDER — HYDROMORPHONE HCL 1 MG/ML IJ SOLN
0.2500 mg | INTRAMUSCULAR | Status: DC | PRN
  Administered 2019-11-19 (×2): 0.5 mg via INTRAVENOUS

## 2019-11-19 MED ORDER — TOBRAMYCIN SULFATE 1.2 G IJ SOLR
INTRAMUSCULAR | Status: AC
  Filled 2019-11-19: qty 1.2

## 2019-11-19 MED ORDER — HYDROCHLOROTHIAZIDE 25 MG PO TABS: 25.0000 mg | ORAL_TABLET | Freq: Every day | ORAL | Status: DC

## 2019-11-19 MED ORDER — SUCCINYLCHOLINE CHLORIDE 200 MG/10ML IV SOSY
PREFILLED_SYRINGE | INTRAVENOUS | Status: AC
  Filled 2019-11-19: qty 10

## 2019-11-19 MED ORDER — CEFAZOLIN SODIUM-DEXTROSE 2-3 GM-%(50ML) IV SOLR
INTRAVENOUS | Status: DC | PRN
  Administered 2019-11-19: 2 g via INTRAVENOUS

## 2019-11-19 MED ORDER — GABAPENTIN 300 MG PO CAPS
300.0000 mg | ORAL_CAPSULE | Freq: Two times a day (BID) | ORAL | Status: DC
  Administered 2019-11-19 – 2019-11-23 (×8): 300 mg via ORAL
  Filled 2019-11-19 (×8): qty 1

## 2019-11-19 MED ORDER — ACETAMINOPHEN 325 MG PO TABS: 650.0000 mg | ORAL_TABLET | Freq: Four times a day (QID) | ORAL | Status: DC | PRN

## 2019-11-19 MED ORDER — HYDROMORPHONE HCL 1 MG/ML IJ SOLN
INTRAMUSCULAR | Status: AC
  Filled 2019-11-19: qty 1

## 2019-11-19 MED ORDER — LORAZEPAM 2 MG/ML IJ SOLN: 1.0000 mg | INTRAMUSCULAR | Status: AC | PRN

## 2019-11-19 MED ORDER — FENTANYL CITRATE (PF) 250 MCG/5ML IJ SOLN
INTRAMUSCULAR | Status: AC
  Filled 2019-11-19: qty 5

## 2019-11-19 MED ORDER — THIAMINE HCL 100 MG/ML IJ SOLN: 100.0000 mg | Freq: Every day | INTRAMUSCULAR | Status: DC

## 2019-11-19 MED ORDER — VANCOMYCIN HCL 1000 MG IV SOLR
INTRAVENOUS | Status: AC
  Filled 2019-11-19: qty 1000

## 2019-11-19 MED ORDER — LIDOCAINE 2% (20 MG/ML) 5 ML SYRINGE
INTRAMUSCULAR | Status: DC | PRN
  Administered 2019-11-19: 60 mg via INTRAVENOUS

## 2019-11-19 MED ORDER — CEFAZOLIN SODIUM-DEXTROSE 2-4 GM/100ML-% IV SOLN
2.0000 g | Freq: Three times a day (TID) | INTRAVENOUS | Status: AC
  Administered 2019-11-19 – 2019-11-20 (×3): 2 g via INTRAVENOUS
  Filled 2019-11-19 (×4): qty 100

## 2019-11-19 MED ORDER — FOLIC ACID 1 MG PO TABS
1.0000 mg | ORAL_TABLET | Freq: Every day | ORAL | Status: DC
  Administered 2019-11-19 – 2019-11-23 (×5): 1 mg via ORAL
  Filled 2019-11-19 (×5): qty 1

## 2019-11-19 MED ORDER — ONDANSETRON HCL 4 MG/2ML IJ SOLN
INTRAMUSCULAR | Status: AC
  Filled 2019-11-19: qty 2

## 2019-11-19 MED ORDER — ENOXAPARIN SODIUM 40 MG/0.4ML ~~LOC~~ SOLN
40.0000 mg | SUBCUTANEOUS | Status: DC
  Administered 2019-11-20 – 2019-11-23 (×4): 40 mg via SUBCUTANEOUS
  Filled 2019-11-19 (×4): qty 0.4

## 2019-11-19 MED ORDER — SODIUM CHLORIDE 0.9 % IR SOLN
Status: DC | PRN
  Administered 2019-11-19: 3000 mL

## 2019-11-19 MED ORDER — LISINOPRIL 20 MG PO TABS
20.0000 mg | ORAL_TABLET | Freq: Every day | ORAL | Status: DC
  Administered 2019-11-19: 20 mg via ORAL
  Filled 2019-11-19: qty 1

## 2019-11-19 MED ORDER — ACETAMINOPHEN 500 MG PO TABS
1000.0000 mg | ORAL_TABLET | Freq: Once | ORAL | Status: AC
  Administered 2019-11-19: 1000 mg via ORAL
  Filled 2019-11-19: qty 2

## 2019-11-19 MED ORDER — ROSUVASTATIN CALCIUM 20 MG PO TABS
40.0000 mg | ORAL_TABLET | Freq: Every day | ORAL | Status: DC
  Administered 2019-11-19 – 2019-11-23 (×5): 40 mg via ORAL
  Filled 2019-11-19 (×5): qty 2

## 2019-11-19 MED ORDER — PHENYLEPHRINE HCL-NACL 10-0.9 MG/250ML-% IV SOLN
INTRAVENOUS | Status: DC | PRN
  Administered 2019-11-19: 25 ug/min via INTRAVENOUS

## 2019-11-19 MED ORDER — PROPOFOL 10 MG/ML IV BOLUS
INTRAVENOUS | Status: AC
  Filled 2019-11-19: qty 40

## 2019-11-19 MED ORDER — AMLODIPINE BESYLATE 10 MG PO TABS
10.0000 mg | ORAL_TABLET | Freq: Every day | ORAL | Status: DC
  Administered 2019-11-20 – 2019-11-23 (×4): 10 mg via ORAL
  Filled 2019-11-19 (×4): qty 1

## 2019-11-19 MED ORDER — PHENYLEPHRINE 40 MCG/ML (10ML) SYRINGE FOR IV PUSH (FOR BLOOD PRESSURE SUPPORT)
PREFILLED_SYRINGE | INTRAVENOUS | Status: AC
  Filled 2019-11-19: qty 10

## 2019-11-19 MED ORDER — SUCCINYLCHOLINE CHLORIDE 200 MG/10ML IV SOSY
PREFILLED_SYRINGE | INTRAVENOUS | Status: DC | PRN
  Administered 2019-11-19: 100 mg via INTRAVENOUS

## 2019-11-19 MED ORDER — CLONIDINE HCL 0.1 MG PO TABS
0.1000 mg | ORAL_TABLET | Freq: Two times a day (BID) | ORAL | Status: DC
  Administered 2019-11-19 – 2019-11-23 (×8): 0.1 mg via ORAL
  Filled 2019-11-19 (×8): qty 1

## 2019-11-19 MED ORDER — ONDANSETRON HCL 4 MG PO TABS: 4.0000 mg | ORAL_TABLET | Freq: Four times a day (QID) | ORAL | Status: DC | PRN

## 2019-11-19 MED ORDER — INSULIN ASPART 100 UNIT/ML ~~LOC~~ SOLN
0.0000 [IU] | Freq: Three times a day (TID) | SUBCUTANEOUS | Status: DC
  Administered 2019-11-20: 1 [IU] via SUBCUTANEOUS
  Administered 2019-11-20: 5 [IU] via SUBCUTANEOUS
  Administered 2019-11-20: 3 [IU] via SUBCUTANEOUS
  Administered 2019-11-21: 2 [IU] via SUBCUTANEOUS
  Administered 2019-11-21: 1 [IU] via SUBCUTANEOUS
  Administered 2019-11-22 (×2): 2 [IU] via SUBCUTANEOUS
  Administered 2019-11-23: 1 [IU] via SUBCUTANEOUS

## 2019-11-19 MED ORDER — PHENYLEPHRINE 40 MCG/ML (10ML) SYRINGE FOR IV PUSH (FOR BLOOD PRESSURE SUPPORT)
PREFILLED_SYRINGE | INTRAVENOUS | Status: DC | PRN
  Administered 2019-11-19: 80 ug via INTRAVENOUS
  Administered 2019-11-19: 40 ug via INTRAVENOUS
  Administered 2019-11-19 (×3): 80 ug via INTRAVENOUS

## 2019-11-19 MED ORDER — EPHEDRINE 5 MG/ML INJ
INTRAVENOUS | Status: AC
  Filled 2019-11-19: qty 10

## 2019-11-19 MED ORDER — LIDOCAINE HCL 2 % IJ SOLN
10.0000 mL | Freq: Once | INTRAMUSCULAR | Status: AC
  Administered 2019-11-19: 200 mg
  Filled 2019-11-19: qty 20

## 2019-11-19 MED ORDER — ONDANSETRON HCL 4 MG/2ML IJ SOLN
INTRAMUSCULAR | Status: DC | PRN
  Administered 2019-11-19: 4 mg via INTRAVENOUS

## 2019-11-19 MED ORDER — SODIUM CHLORIDE 0.9% FLUSH
3.0000 mL | Freq: Two times a day (BID) | INTRAVENOUS | Status: DC
  Administered 2019-11-19 – 2019-11-23 (×8): 3 mL via INTRAVENOUS

## 2019-11-19 MED ORDER — ADULT MULTIVITAMIN W/MINERALS CH
1.0000 | ORAL_TABLET | Freq: Every day | ORAL | Status: DC
  Administered 2019-11-19 – 2019-11-23 (×5): 1 via ORAL
  Filled 2019-11-19 (×5): qty 1

## 2019-11-19 MED ORDER — CEFAZOLIN SODIUM-DEXTROSE 2-4 GM/100ML-% IV SOLN: 2.0000 g | INTRAVENOUS | Status: DC

## 2019-11-19 MED ORDER — ACETAMINOPHEN 325 MG PO TABS
650.0000 mg | ORAL_TABLET | Freq: Four times a day (QID) | ORAL | Status: DC
  Administered 2019-11-19 – 2019-11-23 (×14): 650 mg via ORAL
  Filled 2019-11-19 (×14): qty 2

## 2019-11-19 MED ORDER — CEFAZOLIN SODIUM-DEXTROSE 2-4 GM/100ML-% IV SOLN
INTRAVENOUS | Status: AC
  Filled 2019-11-19: qty 100

## 2019-11-19 MED ORDER — ALBUTEROL SULFATE (2.5 MG/3ML) 0.083% IN NEBU: 2.5000 mg | INHALATION_SOLUTION | Freq: Four times a day (QID) | RESPIRATORY_TRACT | Status: DC | PRN

## 2019-11-19 MED ORDER — ASPIRIN EC 81 MG PO TBEC
81.0000 mg | DELAYED_RELEASE_TABLET | Freq: Every day | ORAL | Status: DC
  Administered 2019-11-19 – 2019-11-23 (×5): 81 mg via ORAL
  Filled 2019-11-19 (×7): qty 1

## 2019-11-19 MED ORDER — MIDAZOLAM HCL 2 MG/2ML IJ SOLN
INTRAMUSCULAR | Status: DC | PRN
  Administered 2019-11-19: 1 mg via INTRAVENOUS

## 2019-11-19 MED ORDER — OXYCODONE HCL 5 MG PO TABS
5.0000 mg | ORAL_TABLET | ORAL | Status: DC | PRN
  Administered 2019-11-19 – 2019-11-22 (×5): 5 mg via ORAL
  Filled 2019-11-19 (×5): qty 1

## 2019-11-19 MED ORDER — DEXAMETHASONE SODIUM PHOSPHATE 10 MG/ML IJ SOLN
INTRAMUSCULAR | Status: AC
  Filled 2019-11-19: qty 1

## 2019-11-19 MED ORDER — ALBUTEROL SULFATE HFA 108 (90 BASE) MCG/ACT IN AERS
INHALATION_SPRAY | RESPIRATORY_TRACT | Status: DC | PRN
  Administered 2019-11-19: 2 via RESPIRATORY_TRACT

## 2019-11-19 MED ORDER — LABETALOL HCL 5 MG/ML IV SOLN
10.0000 mg | INTRAVENOUS | Status: DC | PRN
  Administered 2019-11-23: 10 mg via INTRAVENOUS
  Filled 2019-11-19: qty 4

## 2019-11-19 MED ORDER — MORPHINE SULFATE (PF) 2 MG/ML IV SOLN: 2.0000 mg | INTRAVENOUS | Status: DC | PRN

## 2019-11-19 MED ORDER — SODIUM CHLORIDE 0.9 % IV SOLN: INTRAVENOUS | Status: DC

## 2019-11-19 MED ORDER — LORAZEPAM 1 MG PO TABS: 1.0000 mg | ORAL_TABLET | ORAL | Status: AC | PRN

## 2019-11-19 MED ORDER — LORAZEPAM 2 MG/ML IJ SOLN: 0.0000 mg | Freq: Four times a day (QID) | INTRAMUSCULAR | Status: AC

## 2019-11-19 MED ORDER — MORPHINE SULFATE (PF) 2 MG/ML IV SOLN
2.0000 mg | INTRAVENOUS | Status: DC | PRN
  Administered 2019-11-19 – 2019-11-20 (×2): 2 mg via INTRAVENOUS
  Filled 2019-11-19 (×2): qty 1

## 2019-11-19 MED ORDER — LIDOCAINE 2% (20 MG/ML) 5 ML SYRINGE
INTRAMUSCULAR | Status: AC
  Filled 2019-11-19: qty 5

## 2019-11-19 MED ORDER — MIDAZOLAM HCL 2 MG/2ML IJ SOLN
INTRAMUSCULAR | Status: AC
  Filled 2019-11-19: qty 2

## 2019-11-19 MED ORDER — ROCURONIUM BROMIDE 10 MG/ML (PF) SYRINGE
PREFILLED_SYRINGE | INTRAVENOUS | Status: AC
  Filled 2019-11-19: qty 10

## 2019-11-19 MED ORDER — LORAZEPAM 2 MG/ML IJ SOLN
0.0000 mg | Freq: Two times a day (BID) | INTRAMUSCULAR | Status: DC
  Filled 2019-11-19: qty 2

## 2019-11-19 MED ORDER — PREDNISONE 20 MG PO TABS
40.0000 mg | ORAL_TABLET | Freq: Every day | ORAL | Status: DC
  Administered 2019-11-20: 40 mg via ORAL
  Filled 2019-11-19: qty 2

## 2019-11-19 MED ORDER — BACITRACIN ZINC 500 UNIT/GM EX OINT
TOPICAL_OINTMENT | CUTANEOUS | Status: AC
  Filled 2019-11-19: qty 28.35

## 2019-11-19 MED ORDER — ONDANSETRON HCL 4 MG/2ML IJ SOLN: 4.0000 mg | Freq: Four times a day (QID) | INTRAMUSCULAR | Status: DC | PRN

## 2019-11-19 MED ORDER — TRAZODONE HCL 50 MG PO TABS
50.0000 mg | ORAL_TABLET | Freq: Every evening | ORAL | Status: DC | PRN
  Administered 2019-11-19: 50 mg via ORAL
  Filled 2019-11-19: qty 1

## 2019-11-19 MED ORDER — THIAMINE HCL 100 MG PO TABS
100.0000 mg | ORAL_TABLET | Freq: Every day | ORAL | Status: DC
  Administered 2019-11-19 – 2019-11-23 (×5): 100 mg via ORAL
  Filled 2019-11-19 (×5): qty 1

## 2019-11-19 MED ORDER — ARFORMOTEROL TARTRATE 15 MCG/2ML IN NEBU
15.0000 ug | INHALATION_SOLUTION | Freq: Two times a day (BID) | RESPIRATORY_TRACT | Status: DC
  Administered 2019-11-19 – 2019-11-23 (×8): 15 ug via RESPIRATORY_TRACT
  Filled 2019-11-19 (×8): qty 2

## 2019-11-19 MED ORDER — FENTANYL CITRATE (PF) 100 MCG/2ML IJ SOLN
INTRAMUSCULAR | Status: DC | PRN
  Administered 2019-11-19: 100 ug via INTRAVENOUS
  Administered 2019-11-19: 50 ug via INTRAVENOUS
  Administered 2019-11-19: 100 ug via INTRAVENOUS
  Administered 2019-11-19: 50 ug via INTRAVENOUS
  Administered 2019-11-19: 100 ug via INTRAVENOUS

## 2019-11-19 MED ORDER — 0.9 % SODIUM CHLORIDE (POUR BTL) OPTIME
TOPICAL | Status: DC | PRN
  Administered 2019-11-19: 10:00:00 1000 mL

## 2019-11-19 MED ORDER — PROPOFOL 10 MG/ML IV BOLUS
INTRAVENOUS | Status: DC | PRN
  Administered 2019-11-19: 150 mg via INTRAVENOUS
  Administered 2019-11-19: 50 mg via INTRAVENOUS

## 2019-11-19 MED ORDER — LOSARTAN POTASSIUM 50 MG PO TABS: 100.0000 mg | ORAL_TABLET | Freq: Every day | ORAL | Status: DC

## 2019-11-19 MED ORDER — BUDESONIDE 0.5 MG/2ML IN SUSP
0.5000 mg | Freq: Two times a day (BID) | RESPIRATORY_TRACT | Status: DC
  Administered 2019-11-19 – 2019-11-23 (×8): 0.5 mg via RESPIRATORY_TRACT
  Filled 2019-11-19 (×8): qty 2

## 2019-11-19 MED ORDER — LACTATED RINGERS IV SOLN: INTRAVENOUS | Status: DC

## 2019-11-19 SURGICAL SUPPLY — 58 items
APL PRP STRL LF DISP 70% ISPRP (MISCELLANEOUS) ×1
BLADE SURG 10 STRL SS (BLADE) ×5 IMPLANT
BNDG CMPR MED 10X6 ELC LF (GAUZE/BANDAGES/DRESSINGS) ×2
BNDG COHESIVE 4X5 TAN STRL (GAUZE/BANDAGES/DRESSINGS) ×1 IMPLANT
BNDG ELASTIC 6X10 VLCR STRL LF (GAUZE/BANDAGES/DRESSINGS) ×4 IMPLANT
BNDG GAUZE ELAST 4 BULKY (GAUZE/BANDAGES/DRESSINGS) ×7 IMPLANT
BRUSH SCRUB EZ PLAIN DRY (MISCELLANEOUS) ×2 IMPLANT
CHLORAPREP W/TINT 26 (MISCELLANEOUS) ×3 IMPLANT
COVER SURGICAL LIGHT HANDLE (MISCELLANEOUS) ×2 IMPLANT
COVER WAND RF STERILE (DRAPES) ×3 IMPLANT
DRAPE C-ARMOR (DRAPES) ×1 IMPLANT
DRAPE EXTREMITY BILATERAL (DRAPES) ×2 IMPLANT
DRAPE U-SHAPE 47X51 STRL (DRAPES) ×5 IMPLANT
DRSG ADAPTIC 3X8 NADH LF (GAUZE/BANDAGES/DRESSINGS) ×1 IMPLANT
DRSG MEPILEX BORDER 4X4 (GAUZE/BANDAGES/DRESSINGS) ×4 IMPLANT
DRSG TEGADERM 4X4.75 (GAUZE/BANDAGES/DRESSINGS) ×4 IMPLANT
DRSG TRANSPARENT 2.4X2.8 (GAUZE/BANDAGES/DRESSINGS) ×2 IMPLANT
ELECT CAUTERY BLADE 6.4 (BLADE) ×2 IMPLANT
ELECT REM PT RETURN 9FT ADLT (ELECTROSURGICAL) ×3
ELECTRODE REM PT RTRN 9FT ADLT (ELECTROSURGICAL) IMPLANT
EVACUATOR 1/8 PVC DRAIN (DRAIN) ×4 IMPLANT
GAUZE SPONGE 4X4 12PLY STRL (GAUZE/BANDAGES/DRESSINGS) ×3 IMPLANT
GAUZE SPONGE 4X4 12PLY STRL LF (GAUZE/BANDAGES/DRESSINGS) ×2 IMPLANT
GLOVE BIO SURGEON STRL SZ 6.5 (GLOVE) ×4 IMPLANT
GLOVE BIO SURGEON STRL SZ7.5 (GLOVE) ×12 IMPLANT
GLOVE BIO SURGEONS STRL SZ 6.5 (GLOVE) ×1
GLOVE BIOGEL PI IND STRL 6.5 (GLOVE) ×1 IMPLANT
GLOVE BIOGEL PI IND STRL 7.0 (GLOVE) IMPLANT
GLOVE BIOGEL PI IND STRL 7.5 (GLOVE) ×1 IMPLANT
GLOVE BIOGEL PI INDICATOR 6.5 (GLOVE) ×2
GLOVE BIOGEL PI INDICATOR 7.0 (GLOVE) ×4
GLOVE BIOGEL PI INDICATOR 7.5 (GLOVE) ×2
GOWN STRL REUS W/ TWL LRG LVL3 (GOWN DISPOSABLE) ×2 IMPLANT
GOWN STRL REUS W/TWL LRG LVL3 (GOWN DISPOSABLE) ×9
HANDPIECE INTERPULSE COAX TIP (DISPOSABLE)
KIT BASIN OR (CUSTOM PROCEDURE TRAY) ×3 IMPLANT
KIT TURNOVER KIT B (KITS) ×3 IMPLANT
MANIFOLD NEPTUNE II (INSTRUMENTS) ×3 IMPLANT
NS IRRIG 1000ML POUR BTL (IV SOLUTION) ×3 IMPLANT
PACK ORTHO EXTREMITY (CUSTOM PROCEDURE TRAY) ×3 IMPLANT
PAD ARMBOARD 7.5X6 YLW CONV (MISCELLANEOUS) ×6 IMPLANT
PADDING CAST COTTON 6X4 STRL (CAST SUPPLIES) ×1 IMPLANT
SET CYSTO W/LG BORE CLAMP LF (SET/KITS/TRAYS/PACK) ×2 IMPLANT
SET HNDPC FAN SPRY TIP SCT (DISPOSABLE) IMPLANT
SPONGE LAP 18X18 RF (DISPOSABLE) ×1 IMPLANT
STOCKINETTE IMPERVIOUS 9X36 MD (GAUZE/BANDAGES/DRESSINGS) ×1 IMPLANT
SUT MON AB 2-0 CT1 27 (SUTURE) ×2 IMPLANT
SUT MON AB 2-0 CT1 36 (SUTURE) ×2 IMPLANT
SUT PDS AB 2-0 CT1 27 (SUTURE) IMPLANT
SWAB COLLECTION DEVICE MRSA (MISCELLANEOUS) ×8 IMPLANT
SWAB CULTURE ESWAB REG 1ML (MISCELLANEOUS) IMPLANT
TOWEL GREEN STERILE (TOWEL DISPOSABLE) ×4 IMPLANT
TOWEL GREEN STERILE FF (TOWEL DISPOSABLE) ×3 IMPLANT
TUBE CONNECTING 12'X1/4 (SUCTIONS) ×1
TUBE CONNECTING 12X1/4 (SUCTIONS) ×2 IMPLANT
UNDERPAD 30X30 (UNDERPADS AND DIAPERS) ×3 IMPLANT
WATER STERILE IRR 1000ML POUR (IV SOLUTION) ×3 IMPLANT
YANKAUER SUCT BULB TIP NO VENT (SUCTIONS) ×3 IMPLANT

## 2019-11-19 NOTE — Anesthesia Postprocedure Evaluation (Signed)
Anesthesia Post Note  Patient: Darryl Hurley  Procedure(s) Performed: INCISION AND DRAINAGE BILATERAL SEPTIC KNEES, ASPIRATION OF LEFT KNEE (Bilateral Knee)     Patient location during evaluation: PACU Anesthesia Type: General Level of consciousness: awake and alert Pain management: pain level controlled Vital Signs Assessment: post-procedure vital signs reviewed and stable Respiratory status: spontaneous breathing, nonlabored ventilation and respiratory function stable Cardiovascular status: blood pressure returned to baseline and stable Postop Assessment: no apparent nausea or vomiting Anesthetic complications: no    Last Vitals:  Vitals:   11/19/19 1215 11/19/19 1316  BP: 134/82 (!) 121/93  Pulse: 90 84  Resp: (!) 22 (!) 23  Temp:    SpO2: 95% 95%    Last Pain:  Vitals:   11/19/19 1316  TempSrc:   PainSc: 9                  Saul Dorsi,W. EDMOND

## 2019-11-19 NOTE — H&P (Signed)
History and Physical    PHEONIX CLINKSCALE ZOX:[id number] DOB: 07/16/1961 DOA: 11/18/2019  Referring MD/NP/PA: Odie Sera, MD PCP: Patient, No Pcp Per  Patient coming from: Home  Chief Complaint: Knee pain  I have personally briefly reviewed patient's old medical records in [facility]   HPI: Darryl Hurley is a 58 y.o. male with medical history significant of hypertension, COPD on 2 L of O2, diabetes mellitus type 2, gout, chronic low back pain, and alcohol abuse.  He presents for day history of bilateral knee swelling and pain.  He reports swelling have been worse on the right leg.  He was unable to walk due to the swelling and pain.  Associated symptoms include swelling and decreased range of motion of the hands and elbow.  Denies any recent falls or trauma.  He suspect his symptoms were secondary to a flare of his gout.  He had also reported some mild left-sided chest discomfort, but reports that it is gone away at this time.  ED Course: On admission into the emergency department patient was noted to be afebrile, pulse 95-111, blood pressure 141/107-149/98, and O2 saturations maintained on room air.  WBC 11.9, sodium 132, BUN 15, creatinine 1.4, uric acid 10.1, troponin 10.  Joint aspiration was performed by the ED provider the right knee.  Fluid was noted to be turbid with 431,000 WBCs, 93 neutrophils, 6 lymphocytes, and 1 monocyte-macrophage.  Orthopedics was formally consulted due to concern for septic arthritis.  Review of Systems  Constitutional: Negative for chills and fever.  HENT: Negative for congestion and nosebleeds.   Eyes: Negative for double vision and photophobia.  Respiratory: Negative for cough and shortness of breath.   Cardiovascular: Positive for chest pain and leg swelling.  Gastrointestinal: Negative for abdominal pain and diarrhea.  Genitourinary: Negative for dysuria and hematuria.  Musculoskeletal: Positive for joint pain and myalgias.  Neurological:  Negative for loss of consciousness and headaches.  Psychiatric/Behavioral: Positive for substance abuse.    Past Medical History:  Diagnosis Date  . Alcohol abuse   . Angina pectoris   . Arthritis   . Asthma   . Chronic lower back pain   . COPD (chronic obstructive pulmonary disease) (HCC)    on home O2 2L  . Diabetes mellitus without complication (HCC)   . Exertional shortness of breath   . Gout   . Hypertension   . Seizures (HCC)     Past Surgical History:  Procedure Laterality Date  . NO PAST SURGERIES       reports that he has been smoking cigarettes. He has a 10.00 pack-year smoking history. He has never used smokeless tobacco. He reports current alcohol use. He reports that he does not use drugs.  Allergies  Allergen Reactions  . Nsaids Other (See Comments)    Vagale down reaction  . Toradol [Ketorolac Tromethamine]     vagale down reaction    Family History  Problem Relation Age of Onset  . Diabetes type II Mother   . Depression Father   . Suicidality Father   . Asthma Brother     Prior to Admission medications   Medication Sig Start Date End Date Taking? Authorizing Provider  acetaminophen (TYLENOL) 325 MG tablet Take 2 tablets (650 mg total) by mouth every 6 (six) hours as needed. 05/20/19   Lamptey, Britta Mccreedy, MD  albuterol (PROVENTIL HFA;VENTOLIN HFA) 108 (90 BASE) MCG/ACT inhaler Inhale 1 puff into the lungs every 6 (six) hours as needed  for wheezing or shortness of breath.    [provider]  allopurinol (ZYLOPRIM) 100 MG tablet Take 100 mg by mouth daily.    [provider]  amLODipine (NORVASC) 10 MG tablet Take 10 mg by mouth daily. 08/06/18   [provider]  aspirin EC 81 MG EC tablet Take 1 tablet (81 mg total) by mouth daily. 03/19/19   Patwardhan, Reynold Bowen, MD  cloNIDine (CATAPRES) 0.1 MG tablet Take 0.1 mg by mouth 2 (two) times daily. 08/15/18   [provider]  doxycycline (VIBRAMYCIN) 100 MG capsule Take 1  capsule (100 mg total) by mouth 2 (two) times daily. 10/02/19   Varney Biles, MD  FLOVENT HFA 110 MCG/ACT inhaler Inhale 1 puff into the lungs 2 (two) times daily as needed (shortness of breath).  08/06/18   [provider]  gabapentin (NEURONTIN) 300 MG capsule Take 300 mg by mouth 2 (two) times daily. 03/04/19   [provider]  hydrochlorothiazide (HYDRODIURIL) 25 MG tablet Take 25 mg by mouth daily.  07/14/18   [provider]  lisinopril (ZESTRIL) 20 MG tablet Take 20 mg by mouth daily.    [provider]  losartan (COZAAR) 100 MG tablet Take 100 mg by mouth daily.    [provider]  meloxicam (MOBIC) 7.5 MG tablet Take 7.5 mg by mouth daily.    [provider]  metFORMIN (GLUCOPHAGE) 1000 MG tablet Take 1 tablet (1,000 mg total) by mouth 2 (two) times daily with a meal. 01/08/16   Robbie Lis, MD  nitroGLYCERIN (NITROSTAT) 0.4 MG SL tablet Place 1 tablet (0.4 mg total) under the tongue every 5 (five) minutes x 3 doses as needed for chest pain. 03/18/19   Patwardhan, Reynold Bowen, MD  predniSONE (DELTASONE) 10 MG tablet Take 5 tablets (50 mg total) by mouth daily. 10/02/19   Varney Biles, MD  rosuvastatin (CRESTOR) 40 MG tablet Take 40 mg by mouth daily.    [provider]  tiZANidine (ZANAFLEX) 4 MG capsule Take 4 mg by mouth as needed for muscle spasms.  06/25/18   [provider]  traZODone (DESYREL) 50 MG tablet Take 1 tablet (50 mg total) by mouth at bedtime as needed for sleep. 05/20/19   Lamptey, Myrene Galas, MD  labetalol (NORMODYNE) 200 MG tablet Take 1 tablet (200 mg total) by mouth 2 (two) times daily. Patient not taking: Reported on 03/25/2019 02/16/19 05/20/19  Nigel Mormon, MD    Physical Exam:  Constitutional: Middle-age male who appears to be in no acute distress Vitals:   11/18/19 2117 11/19/19 0018 11/19/19 0300 11/19/19 0313  BP: (!) 141/107 (!) 149/98    Pulse: 95 (!) 111 (!) 105   Resp: 18 20      Temp: 98.5 F (36.9 C) 99.1 F (37.3 C)    TempSrc: Oral Oral    SpO2: 98% 100% 95%   Weight:    81.6 kg  Height:    5\' 5"  (1.651 m)   Eyes: PERRL, lids and conjunctivae normal ENMT: Mucous membranes are moist. Posterior pharynx clear of any exudate or lesions.  Neck: normal, supple, no masses, no thyromegaly Respiratory: clear to auscultation bilaterally, no wheezing, no crackles. Normal respiratory effort. No accessory muscle use.  Cardiovascular: Regular rate and rhythm, no murmurs / rubs / gallops. No extremity edema. 2+ pedal pulses. No carotid bruits.  Abdomen: no tenderness, no masses palpated. No hepatosplenomegaly. Bowel sounds positive.  Musculoskeletal: no clubbing / cyanosis.  Bilateral  knee effusions.  Decreased range of motion due to pain.. Good ROM, no contractures. Normal muscle tone.  Skin: no rashes, lesions, ulcers. No induration Neurologic: CN 2-12 grossly intact. Sensation intact, DTR normal. Strength 5/5 in all 4.  Psychiatric: Normal judgment and insight. Alert and oriented x 3. Normal mood.     Labs on Admission: I have personally reviewed following labs and imaging studies  CBC: Recent Labs  Lab 11/19/19 0321  WBC 11.9*  NEUTROABS 8.9*  HGB 14.9  HCT 44.6  MCV 92.9  PLT 334   Basic Metabolic Panel: Recent Labs  Lab 11/19/19 0321  NA 132*  K 3.9  CL 98  CO2 20*  GLUCOSE 170*  BUN 15  CREATININE 1.40*  CALCIUM 9.4   GFR: Estimated Creatinine Clearance: 57.2 mL/min (A) (by C-G formula based on SCr of 1.4 mg/dL (H)). Liver Function Tests: No results for input(s): AST, ALT, ALKPHOS, BILITOT, PROT, ALBUMIN in the last 168 hours. No results for input(s): LIPASE, AMYLASE in the last 168 hours. No results for input(s): AMMONIA in the last 168 hours. Coagulation Profile: No results for input(s): INR, PROTIME in the last 168 hours. Cardiac Enzymes: No results for input(s): CKTOTAL, CKMB, CKMBINDEX, TROPONINI in the last 168 hours. BNP (last 3  results) No results for input(s): PROBNP in the last 8760 hours. HbA1C: No results for input(s): HGBA1C in the last 72 hours. CBG: No results for input(s): GLUCAP in the last 168 hours. Lipid Profile: No results for input(s): CHOL, HDL, LDLCALC, TRIG, CHOLHDL, LDLDIRECT in the last 72 hours. Thyroid Function Tests: No results for input(s): TSH, T4TOTAL, FREET4, T3FREE, THYROIDAB in the last 72 hours. Anemia Panel: No results for input(s): VITAMINB12, FOLATE, FERRITIN, TIBC, IRON, RETICCTPCT in the last 72 hours. Urine analysis:    Component Value Date/Time   COLORURINE STRAW (A) 11/27/2018 2349   APPEARANCEUR CLEAR 11/27/2018 2349   LABSPEC 1.006 11/27/2018 2349   PHURINE 5.0 11/27/2018 2349   GLUCOSEU NEGATIVE 11/27/2018 2349   HGBUR SMALL (A) 11/27/2018 2349   BILIRUBINUR NEGATIVE 11/27/2018 2349   KETONESUR NEGATIVE 11/27/2018 2349   PROTEINUR NEGATIVE 11/27/2018 2349   UROBILINOGEN 0.2 02/02/2014 2030   NITRITE NEGATIVE 11/27/2018 2349   LEUKOCYTESUR NEGATIVE 11/27/2018 2349   Sepsis Labs: No results found for this or any previous visit (from the past 240 hour(s)).   Radiological Exams on Admission: DG Knee 1-2 Views Left  Result Date: 11/19/2019 CLINICAL DATA:  Pain and swelling EXAM: LEFT KNEE - 1-2 VIEW COMPARISON:  December 25, 2018 FINDINGS: Frontal and lateral views were obtained. No fracture or dislocation. There is a large joint effusion. There is moderate generalized joint space narrowing with slight intra-articular calcification medially. There is spurring in all compartments. No erosive change. There are foci of calcification in the superficial femoral and popliteal arteries. IMPRESSION: 1.  Large joint effusion.  No fracture or dislocation. 2.  Generalized osteoarthritic change, similar to prior study. 3.  Multiple foci of arterial vascular calcification. Electronically Signed   By: Bretta BangWilliam  Woodruff III M.D.   On: 11/19/2019 07:27   DG Knee 1-2 Views  Right  Result Date: 11/19/2019 CLINICAL DATA:  Pain and swelling EXAM: RIGHT KNEE - 1-2 VIEW COMPARISON:  January 20, 2013 FINDINGS: Frontal and lateral views were obtained. No fracture or dislocation. There is a large joint effusion. There is relatively mild narrowing medially and in the patellofemoral joint region. There is mild spurring in all compartments. There is prepatellar soft tissue swelling with  calcification in this area inferiorly, a finding also present previously. There are foci of arterial vascular calcification in the superficial femoral artery and popliteal artery regions. IMPRESSION: 1.  Large joint effusion.  No acute fracture or dislocation. 2. Prepatellar soft tissue swelling with calcification inferiorly in this area. Question residua of prior hematoma versus prepatellar inflammatory lesion. Clinical assessment of this area advised. 3. Osteoarthritic change, primarily medially and in the patellofemoral joint. 4.  Foci of arterial vascular calcification. Electronically Signed   By: Bretta Bang III M.D.   On: 11/19/2019 07:25   DG Chest Port 1 View  Result Date: 11/19/2019 CLINICAL DATA:  Chest pain EXAM: PORTABLE CHEST 1 VIEW COMPARISON:  10/02/2019 FINDINGS: Normal heart size and stable aortic contours. Stable low volume chest. There is no edema, consolidation, effusion, or pneumothorax. IMPRESSION: No evidence of active disease. Electronically Signed   By: Marnee Spring M.D.   On: 11/19/2019 04:40    EKG: Independently reviewed.  Normal sinus rhythm at 97 bpm  Assessment/Plan Sepsis secondary to septic arthritis, polyarthralgia gout flare: Acute.  Patient presents with swelling of bilateral knees.  Fluid analysis from the right knee concerning for septic arthritis.  Dr. Jena Gauss of orthopedics took the patient for incision and drainage were cultures were sent.  Patient had been empirically treated with cefazolin prior to surgery. -Admit to a telemetry bed -Morphine as  needed for pain -Prednisone 40 mg daily -Continue empiric antibiotics of cefazolin -Follow-up cultures from surgery -Appreciate orthopedic consultative services, we will follow-up for further recommendations.  Chest pain: Patient reports being chest pain-free at this time.  EKG without significant ischemic changes and initial troponin within normal limits. -Recheck cardiac troponin  Hyponatremia: Acute.  Sodium 132.  Likely secondary to patient history of alcohol abuse. -Continue to monitor  COPD, without acute exacerbation, oxygen: Patient chronically on 2 L of nasal cannula oxygen at baseline.  On physical exam patient without significant wheezes or rhonchi. -Continue inhalers  Chronic kidney disease stage III: Creatinine 1.4 on admission which appears near his baseline. -Continue to monitor  Essential hypertension: On admission blood pressures 141/107-149/98. -Continue amlodipine, clonidine, hydrochlorothiazide, losartan, and lisinopril  Diabetes mellitus type 2: Last hemoglobin A1c noted to be 6.7 on 03/18/2019.  Patient's initial blood glucose elevated at 178 on admission.  Home regimen consist of Metformin. -Hypoglycemic protocols -Check hemoglobin A1c -Hold Metformin -CBGs with sensitive SSI  Hyperlipidemia -Continue Crestor  Alcohol abuse: Patient reports drinking a multiple 40 ounce beers per day plus a pint of liquor. -CIWA protocol with Ativan scheduled -Consider need to increase to ICU protocol where Ativan can be given every hour if needed  Tobacco abuse: -Continue to counsel on need of cessation of tobacco   DVT prophylaxis: Lovenox Code Status: Full Family Communication: No family present at bedside Disposition Plan: Possible discharge home in 2 to 3 days Consults called: Orthopedics Admission status: Inpatient  Clydie Braun MD [facility] Pager [phone]   If 7PM-7AM, please contact night-coverage [email or web address] Password  [email or web address]  11/19/2019, 8:12 AM

## 2019-11-19 NOTE — Transfer of Care (Signed)
Immediate Anesthesia Transfer of Care Note  Patient: Darryl Hurley  Procedure(s) Performed: INCISION AND DRAINAGE BILATERAL SEPTIC KNEES, ASPIRATION OF LEFT KNEE (Bilateral Knee)  Patient Location: PACU  Anesthesia Type:General  Level of Consciousness: drowsy and patient cooperative  Airway & Oxygen Therapy: Patient Spontanous Breathing and Patient connected to face mask oxygen  Post-op Assessment: Report given to RN and Post -op Vital signs reviewed and stable  Post vital signs: Reviewed and stable  Last Vitals:  Vitals Value Taken Time  BP    Temp    Pulse 82 11/19/19 1116  Resp 17 11/19/19 1116  SpO2 100 % 11/19/19 1116  Vitals shown include unvalidated device data.  Last Pain:  Vitals:   11/19/19 0018  TempSrc: Oral         Complications: No apparent anesthesia complications

## 2019-11-19 NOTE — Anesthesia Preprocedure Evaluation (Addendum)
Anesthesia Evaluation  Patient identified by MRN, date of birth, ID band Patient awake    Reviewed: Allergy & Precautions, H&P , NPO status , Patient's Chart, lab work & pertinent test results  Airway Mallampati: I  TM Distance: >3 FB Neck ROM: Full    Dental no notable dental hx. (+) Missing, Dental Advisory Given,    Pulmonary asthma , COPD,  COPD inhaler and oxygen dependent, Current Smoker,    Pulmonary exam normal        Cardiovascular hypertension, Pt. on medications  Rhythm:Regular Rate:Tachycardia     Neuro/Psych  Headaches, negative psych ROS   GI/Hepatic negative GI ROS, Neg liver ROS,   Endo/Other  diabetes, Type 2, Oral Hypoglycemic Agents  Renal/GU negative Renal ROS  negative genitourinary   Musculoskeletal   Abdominal   Peds  Hematology negative hematology ROS (+)   Anesthesia Other Findings   Reproductive/Obstetrics negative OB ROS                            Anesthesia Physical Anesthesia Plan  ASA: II  Anesthesia Plan: General   Post-op Pain Management:    Induction: Intravenous  PONV Risk Score and Plan: 2 and Ondansetron and Midazolam  Airway Management Planned: Oral ETT  Additional Equipment:   Intra-op Plan:   Post-operative Plan: Extubation in OR  Informed Consent: I have reviewed the patients History and Physical, chart, labs and discussed the procedure including the risks, benefits and alternatives for the proposed anesthesia with the patient or authorized representative who has indicated his/her understanding and acceptance.     Dental advisory given  Plan Discussed with: CRNA  Anesthesia Plan Comments:         Anesthesia Quick Evaluation

## 2019-11-19 NOTE — Op Note (Signed)
Orthopaedic Surgery Operative Note (CSN: [id number] ) Date of Surgery: 11/19/2019  Admit Date: 11/18/2019   Diagnoses: Pre-Op Diagnoses: Bilateral septic arthritis   Post-Op Diagnosis: Same  Procedures: 1. CPT 27310 x2-Incision and drainage of bilateral septic knee joints 2. CPT 20610-Aspiration of left knee  Surgeons : Primary: Khalon Cansler, Gillie Manners, MD  Assistant: Ulyses Southward, PA-C  Location: OR 18   Anesthesia:General  Antibiotics: Ancef 2g after cultures obtained.   Tourniquet time:None    Estimated Blood Loss:30 mL  Complications:None  Specimens: ID Type Source Tests Collected by Time Destination  A : 1) Right knee Tissue PATH Other GRAM STAIN, AEROBIC/ANAEROBIC CULTURE (SURGICAL/DEEP WOUND) Ozro Russett, Gillie Manners, MD 11/19/2019 1023   B : 2) Left knee Tissue PATH Other GRAM STAIN, AEROBIC/ANAEROBIC CULTURE (SURGICAL/DEEP WOUND) Anysia Choi, Gillie Manners, MD 11/19/2019 1025      Implants: * No implants in log *   Indications for Surgery: 58 year old male with a history of gout who had a 4-day history of increased swelling and pain on his knees as well as multiple other joints.  Presented to the emergency room where aspiration was performed which showed a white blood cell count of 413,000 with 93% neutrophils.  There were positive gout crystals however with the cell count I recommended proceeding with irrigation and debridement of his right knee.  I also recommended aspiration of his left knee with possible incision and drainage of the left knee as well if the fluid appeared to be purulent.  Risks and benefits were discussed with the patient.  He agreed to proceed with surgery and consent was obtained  Operative Findings: 1.  Aspiration of left knee showed murky, cloudy and bloody fluid that was concerning for possible septic arthritis. 2.  Incision and drainage of bilateral septic knees with irrigation of the knee joint.  Procedure: The patient was identified in the preoperative  holding area. Consent was confirmed with the patient and their family and all questions were answered. The operative extremity was marked after confirmation with the patient. he was then brought back to the operating room by our anesthesia colleagues.  He was carefully transferred over to a regular or table.  He was placed under general anesthetic.  Timeout was performed to verify the patient procedure and the extremity.  Preoperative antibiotics were held due to need for obtaining cultures.  The left lower extremity was prepped for an aspiration.  Using an 18-gauge needle I aspirated the joint and obtained about 10 cc of cloudy fluid.  It appeared that the effusion was loculated and I felt that proceeding with incision and drainage of the left knee was appropriate.  The bilateral lower extremities were prepped prepped and draped in usual sterile fashion.  Another timeout was performed to verify the patient procedure and the extremities.  For started out with the right knee.  A medial parapatellar incision was made carried down through skin and subcutaneous tissue.  I released the retinaculum medially entered the joint and encountered significant cloudy murky fluid.  I sent specimen for culture.  I then removed all the fluid and used a ronguer to debride the suprapatellar pouch as well as other fibrinous material within the knee joint.  The process was repeated on the left side with a medial parapatellar incision.  I evacuated the fluid sucked cultures.  It was not as cloudy as the right knee.  However there was still had a concerning appearance to it.  Debrided the suprapatellar pouch with a ronguer.  I then  irrigated both of the knees with approximately 3 L of normal saline.  I then placed a medium Hemovac drains in both of the knees.  A layered closure was performed consisting of 2-0 Monocryl and 3-0 nylon.  Sterile dressings were placed.  The legs were wrapped.  The patient was awoken from anesthesia and  taken the PACU in stable condition.  Post Op Plan/Instructions: I will recommend weightbearing as tolerated to bilateral lower extremities.  Antibiotics per the primary team.  I recommend likely systemic steroids for his polyarthralgias associated with gout.  DVT prophylaxis will be Lovenox as an inpatient and discharged home on aspirin.  We will continue the drain until minimal output.  I was present and performed the entire surgery.  Patrecia Pace, PA-C did assist me throughout the case. An assistant was necessary given the difficulty in approach and drainage of bilateral knees.   Katha Hamming, MD [facility]

## 2019-11-19 NOTE — Anesthesia Procedure Notes (Signed)
Procedure Name: Intubation Date/Time: 11/19/2019 9:54 AM Performed by: Lowella Dell, CRNA Pre-anesthesia Checklist: Patient identified, Emergency Drugs available, Suction available and Patient being monitored Patient Re-evaluated:Patient Re-evaluated prior to induction Oxygen Delivery Method: Circle System Utilized Preoxygenation: Pre-oxygenation with 100% oxygen Induction Type: IV induction and Rapid sequence Laryngoscope Size: Mac and 4 Grade View: Grade I Tube type: Oral Tube size: 7.5 mm Number of attempts: 1 Airway Equipment and Method: Stylet Placement Confirmation: ETT inserted through vocal cords under direct vision,  positive ETCO2 and breath sounds checked- equal and bilateral Secured at: 21 cm Tube secured with: Tape Dental Injury: Teeth and Oropharynx as per pre-operative assessment

## 2019-11-19 NOTE — ED Provider Notes (Addendum)
[facility] EMERGENCY DEPARTMENT Provider Note   CSN: [id number] Arrival date & time: 11/18/19  2112     History Chief Complaint  Patient presents with   Leg Pain    Darryl Hurley is a 58 y.o. male.  Patient presents to the emergency department with complaints of possible gout.  Patient reports that he has been experiencing pain in both of his knees, right is greater than left.  Patient having trouble walking around now because of the increased pain and swelling.  He denies any direct trauma.  This feels like previous episodes of gout.  In addition to this, patient experiencing a left mid chest discomfort.  Pain is sharp in nature.  The area is tender to the touch.  He denies any falls or trauma.        Past Medical History:  Diagnosis Date   Alcohol abuse    Angina pectoris    Arthritis    Asthma    Chronic lower back pain    COPD (chronic obstructive pulmonary disease) (HCC)    on home O2 2L   Diabetes mellitus without complication (HCC)    Exertional shortness of breath    Gout    Hypertension    Seizures (HCC)     Patient Active Problem List   Diagnosis Date Noted   Polyarthralgia 11/19/2019   Lactic acidosis 01/06/2016   Lactic acid acidosis 01/06/2016   Alcohol abuse with intoxication (HCC) 01/06/2016   COPD with acute exacerbation (HCC) 02/02/2014   HCAP (healthcare-associated pneumonia) 02/02/2014   Increased anion gap metabolic acidosis 02/02/2014   Cocaine abuse (HCC) 11/18/2013   Fever 11/17/2013   Anemia 11/17/2013   Hypokalemia 11/17/2013   Polyarthritis 11/17/2013   Dehydration 11/17/2013   Type 2 diabetes mellitus (HCC) 05/14/2013   Chronic obstructive pulmonary disease (COPD) (HCC) 05/12/2013   Gout 05/12/2013   Headache 05/12/2013   Asthma 05/04/2013   TOBACCO ABUSE 04/27/2009   Uncontrolled hypertension 04/27/2009    Past Surgical History:  Procedure Laterality Date   NO PAST  SURGERIES         Family History  Problem Relation Age of Onset   Diabetes type II Mother    Depression Father    Suicidality Father    Asthma Brother     Social History   Tobacco Use   Smoking status: Current Every Day Smoker    Packs/day: 0.25    Years: 40.00    Pack years: 10.00    Types: Cigarettes   Smokeless tobacco: Never Used   Tobacco comment: 05/12/2013 'eased off smoking since last month"  Substance Use Topics   Alcohol use: Yes    Comment: 4 40 oz beers per day, 1 pint of liqor in a day   Drug use: No    Types: "Crack" cocaine    Home Medications Prior to Admission medications   Medication Sig Start Date End Date Taking? Authorizing Provider  acetaminophen (TYLENOL) 325 MG tablet Take 2 tablets (650 mg total) by mouth every 6 (six) hours as needed. 05/20/19   Lamptey, Britta Mccreedy, MD  albuterol (PROVENTIL HFA;VENTOLIN HFA) 108 (90 BASE) MCG/ACT inhaler Inhale 1 puff into the lungs every 6 (six) hours as needed for wheezing or shortness of breath.    [provider]  allopurinol (ZYLOPRIM) 100 MG tablet Take 100 mg by mouth daily.    [provider]  amLODipine (NORVASC) 10 MG tablet Take 10 mg by mouth daily. 08/06/18  [provider]  aspirin EC 81 MG EC tablet Take 1 tablet (81 mg total) by mouth daily. 03/19/19   Patwardhan, Anabel Bene, MD  cloNIDine (CATAPRES) 0.1 MG tablet Take 0.1 mg by mouth 2 (two) times daily. 08/15/18   [provider]  doxycycline (VIBRAMYCIN) 100 MG capsule Take 1 capsule (100 mg total) by mouth 2 (two) times daily. 10/02/19   Derwood Kaplan, MD  FLOVENT HFA 110 MCG/ACT inhaler Inhale 1 puff into the lungs 2 (two) times daily as needed (shortness of breath).  08/06/18   [provider]  gabapentin (NEURONTIN) 300 MG capsule Take 300 mg by mouth 2 (two) times daily. 03/04/19   [provider]  hydrochlorothiazide (HYDRODIURIL) 25 MG tablet Take 25 mg by mouth daily.  07/14/18   [provider]  lisinopril (ZESTRIL) 20 MG tablet Take 20 mg by mouth daily.    [provider]  losartan (COZAAR) 100 MG tablet Take 100 mg by mouth daily.    [provider]  meloxicam (MOBIC) 7.5 MG tablet Take 7.5 mg by mouth daily.    [provider]  metFORMIN (GLUCOPHAGE) 1000 MG tablet Take 1 tablet (1,000 mg total) by mouth 2 (two) times daily with a meal. 01/08/16   Alison Murray, MD  nitroGLYCERIN (NITROSTAT) 0.4 MG SL tablet Place 1 tablet (0.4 mg total) under the tongue every 5 (five) minutes x 3 doses as needed for chest pain. 03/18/19   Patwardhan, Anabel Bene, MD  predniSONE (DELTASONE) 10 MG tablet Take 5 tablets (50 mg total) by mouth daily. 10/02/19   Derwood Kaplan, MD  rosuvastatin (CRESTOR) 40 MG tablet Take 40 mg by mouth daily.    [provider]  tiZANidine (ZANAFLEX) 4 MG capsule Take 4 mg by mouth as needed for muscle spasms.  06/25/18   [provider]  traZODone (DESYREL) 50 MG tablet Take 1 tablet (50 mg total) by mouth at bedtime as needed for sleep. 05/20/19   Lamptey, Britta Mccreedy, MD  labetalol (NORMODYNE) 200 MG tablet Take 1 tablet (200 mg total) by mouth 2 (two) times daily. Patient not taking: Reported on 03/25/2019 02/16/19 05/20/19  Elder Negus, MD    Allergies    Nsaids and Toradol [ketorolac tromethamine]  Review of Systems   Review of Systems  Cardiovascular: Positive for chest pain.  Musculoskeletal: Positive for arthralgias.  All other systems reviewed and are negative.   Physical Exam Updated Vital Signs BP (!) 149/98 (BP Location: Left Arm)    Pulse (!) 105    Temp 99.1 F (37.3 C) (Oral)    Resp 20    Ht  (1.651 m)    Wt 81.6 kg    SpO2 95%    BMI 29.94 kg/m   Physical Exam Vitals and nursing note reviewed.  Constitutional:      General: He is not in acute distress.    Appearance: Normal appearance. He is well-developed.  HENT:     Head: Normocephalic and atraumatic.     Right Ear:  Hearing normal.     Left Ear: Hearing normal.     Nose: Nose normal.  Eyes:     Conjunctiva/sclera: Conjunctivae normal.     Pupils: Pupils are equal, round, and reactive to light.  Cardiovascular:     Rate and Rhythm: Regular rhythm.     Heart sounds: S1 normal and S2 normal. No murmur. No friction rub. No gallop.   Pulmonary:  Effort: Pulmonary effort is normal. No respiratory distress.     Breath sounds: Normal breath sounds.  Chest:     Chest wall: Tenderness present.  Abdominal:     General: Bowel sounds are normal.     Palpations: Abdomen is soft.     Tenderness: There is no abdominal tenderness. There is no guarding or rebound. Negative signs include Murphy's sign and McBurney's sign.     Hernia: No hernia is present.  Musculoskeletal:     Left elbow: No swelling or deformity. Tenderness present.     Cervical back: Normal range of motion and neck supple.     Right knee: Effusion present. No erythema. Normal range of motion. Tenderness present.     Left knee: Effusion present. No erythema. Normal range of motion. Tenderness present.  Skin:    General: Skin is warm and dry.     Findings: No rash.  Neurological:     Mental Status: He is alert and oriented to person, place, and time.     GCS: GCS eye subscore is 4. GCS verbal subscore is 5. GCS motor subscore is 6.     Cranial Nerves: No cranial nerve deficit.     Sensory: No sensory deficit.     Coordination: Coordination normal.  Psychiatric:        Speech: Speech normal.        Behavior: Behavior normal.        Thought Content: Thought content normal.     ED Results / Procedures / Treatments   Labs (all labs ordered are listed, but only abnormal results are displayed) Labs Reviewed  CBC WITH DIFFERENTIAL/PLATELET - Abnormal; Notable for the following components:      Result Value   WBC 11.9 (*)    RDW 16.2 (*)    Neutro Abs 8.9 (*)    Monocytes Absolute 1.7 (*)    Abs Immature Granulocytes 0.09 (*)    All  other components within normal limits  BASIC METABOLIC PANEL - Abnormal; Notable for the following components:   Sodium 132 (*)    CO2 20 (*)    Glucose, Bld 170 (*)    Creatinine, Ser 1.40 (*)    GFR calc non Af Amer 55 (*)    All other components within normal limits  URIC ACID - Abnormal; Notable for the following components:   Uric Acid, Serum 10.1 (*)    All other components within normal limits  SYNOVIAL CELL COUNT + DIFF, W/ CRYSTALS - Abnormal; Notable for the following components:   Color, Synovial AMBER (*)    Appearance-Synovial TURBID (*)    WBC, Synovial 431,000 (*)    Neutrophil, Synovial 93 (*)    Monocyte-Macrophage-Synovial Fluid 1 (*)    All other components within normal limits  BODY FLUID CULTURE  GRAM STAIN  CULTURE, BLOOD (ROUTINE X 2)  CULTURE, BLOOD (ROUTINE X 2)  RESPIRATORY PANEL BY RT PCR (FLU A&B, COVID)  GLUCOSE, BODY FLUID OTHER  PROTEIN, BODY FLUID (OTHER)  URIC ACID, BODY FLUID  SEDIMENTATION RATE  C-REACTIVE PROTEIN  TROPONIN I (HIGH SENSITIVITY)  TROPONIN I (HIGH SENSITIVITY)    EKG EKG Interpretation  Date/Time:  Thursday November 19 2019 07:06:00 EST Ventricular Rate:  97 PR Interval:  138 QRS Duration: 84 QT Interval:  342 QTC Calculation: 434 R Axis:   44 Text Interpretation: Normal sinus rhythm Nonspecific ST and T wave abnormality Abnormal ECG No significant change since last tracing Confirmed by Gilda Crease [phone])  on 11/19/2019 7:18:24 AM   Radiology DG Chest Port 1 View  Result Date: 11/19/2019 CLINICAL DATA:  Chest pain EXAM: PORTABLE CHEST 1 VIEW COMPARISON:  10/02/2019 FINDINGS: Normal heart size and stable aortic contours. Stable low volume chest. There is no edema, consolidation, effusion, or pneumothorax. IMPRESSION: No evidence of active disease. Electronically Signed   By: Monte Fantasia M.D.   On: 11/19/2019 04:40    Procedures .Joint Aspiration/Arthrocentesis  Date/Time: 11/19/2019 4:10  AM Performed by: Orpah Greek, MD Authorized by: Orpah Greek, MD   Consent:    Consent obtained:  Written   Consent given by:  Patient   Risks discussed:  Bleeding, infection and pain Universal protocol:    Procedure explained and questions answered to patient or proxy's satisfaction: yes     Relevant documents present and verified: yes     Test results available and properly labeled: yes     Required blood products, implants, devices, and special equipment available: yes     Site/side marked: yes     Immediately prior to procedure, a time out was called: yes     Patient identity confirmed:  Verbally with patient Location:    Location:  Knee   Knee:  R knee Anesthesia (see MAR for exact dosages):    Anesthesia method:  Local infiltration   Local anesthetic:  Lidocaine 2% w/o epi Procedure details:    Preparation: Patient was prepped and draped in usual sterile fashion     Needle gauge:  18 G   Ultrasound guidance: yes     Approach:  Medial   Aspirate amount:  17mL   Aspirate characteristics:  Cloudy and bloody   Steroid injected: no     Specimen collected: yes   Post-procedure details:    Dressing:  Adhesive bandage   Patient tolerance of procedure:  Tolerated well, no immediate complications   (including critical care time)  Medications Ordered in ED Medications  acetaminophen (TYLENOL) tablet 650 mg (has no administration in time range)  morphine 2 MG/ML injection 2-4 mg (has no administration in time range)  labetalol (NORMODYNE) injection 10 mg (has no administration in time range)  lidocaine (XYLOCAINE) 2 % (with pres) injection 200 mg (200 mg Infiltration Given 11/19/19 8841)    ED Course  I have reviewed the triage vital signs and the nursing notes.  Pertinent labs & imaging results that were available during my care of the patient were reviewed by me and considered in my medical decision making (see chart for details).    MDM  Rules/Calculators/A&P                      Patient presents to the emergency department with multiple complaints.  Patient's primary complaint is pain in multiple joints.  He is complaining of pain in his elbows and bilateral knees.  Patient having difficulty walking because of severe pain.  His right knee is the worst.  Examination revealed a tense effusion of the right knee and a ballotable but smaller effusion of the left knee.  Both knees feel warm to the touch but no overlying erythema or skin changes.  Arthrocentesis performed.  Fluid was very turbid and cloudy, borderline purulent on exam.  Studies of the fluid are positive for intracellular monosodium urate crystals as well as intracellular calcium pyrophosphate crystals.  The white blood cell count on the fluid, however, was 431,000.  This is well into the range where septic arthritis needs to  be considered.  Will add on sed rate, CRP.  We will also add on blood culture, as if this is infectious, need to establish source.  Discussed with Dr. Jena GaussHaddix, on-call for orthopedics.  He advises to keep patient n.p.o. and will see patient this morning.  No antibiotics at this time.  In addition to the patient's joint complaints, he is experiencing left-sided chest pain.  This does seem somewhat atypical.  He has tenderness over the area and it is reproducible with moving his left arm as well.  Seems consistent with musculoskeletal chest pain, however there has been concern over possible angina and acute coronary syndrome and the patient in the past.  Will therefore need  to cycle cardiac enzymes.  Will admit to hospitalist for further management.  Final Clinical Impression(s) / ED Diagnoses Final diagnoses:  Polyarthritis  Chest pain, unspecified type    Rx / DC Orders ED Discharge Orders    None       Darcia Lampi, Canary Brimhristopher J, MD 11/19/19 [id number]    Gilda CreasePollina, Jaksen Fiorella J, MD 11/19/19 [phone]

## 2019-11-19 NOTE — Consult Note (Signed)
Orthopaedic Trauma Service (OTS) Consult   Patient ID: Darryl Hurley MRN: [id number] DOB/AGE: 02-03-1961 58 y.o.  Reason for Consult: Bilateral septic knees Referring Physician: Dr. Joseph Berkshire, MD [facility] [place])  HPI: Darryl Hurley is an 58 y.o. male being seen in consultation at the request of Dr. Betsey Holiday for bilateral septic knees. Patient presented to the emergency room yesterday evening with complaints of bilateral knee pain, right greater than left.  He is concerned for possible gout, as he has experienced episodes such as this in the past.  Patient acknowledges having difficulty standing and ambulating because of increased pain and swelling in his knees.  He denies any direct trauma to the area.  Getting no relief taking his gout medication at home.  Arthrocentesis was performed on right knee, fluid appeared turbid and cloudy.  White blood cell count of fluid resulted as 431,000.  Orthopedic trauma service was consulted for concern of septic arthritis.  Patient admitted to hospitalist service.  Patient seen this morning in emergency department.  Continues to to have complaints of bilateral knee pain, right still worse than left.  Pain on the right leg extends from the hip down to the knee.  No hip pain on the left.  Pain with range of motion of bilateral knees.  Also notes some left-sided chest pain, cardiac enzymes being followed.  ESR, CRP, blood cultures have been ordered but not resulted yet.  Patient notes a history of asthma, hypertension, diabetes.  He takes medication for his asthma and hypertension but does not take medications for diabetes.  He monitors his blood sugars at home on a regular basis.  He is an occasional smoker.  Drinks about four 40 ounce beers per day plus a pint of liquor.  Past Medical History:  Diagnosis Date  . Alcohol abuse   . Angina pectoris   . Arthritis   . Asthma   . Chronic lower back pain   . COPD (chronic obstructive pulmonary disease)  (HCC)    on home O2 2L  . Diabetes mellitus without complication ([place])   . Exertional shortness of breath   . Gout   . Hypertension   . Seizures ([place])     Past Surgical History:  Procedure Laterality Date  . NO PAST SURGERIES      Family History  Problem Relation Age of Onset  . Diabetes type II Mother   . Depression Father   . Suicidality Father   . Asthma Brother     Social History:  reports that he has been smoking cigarettes. He has a 10.00 pack-year smoking history. He has never used smokeless tobacco. He reports current alcohol use. He reports that he does not use drugs.  Allergies:  Allergies  Allergen Reactions  . Nsaids Other (See Comments)    Vagale down reaction  . Toradol [Ketorolac Tromethamine]     vagale down reaction    Medications: I have reviewed the patient's current medications.  ROS: Constitutional: No fever or chills Vision: No changes in vision ENT: No difficulty swallowing CV: +left side chest pain Pulm: No SOB or wheezing GI: No nausea or vomiting GU: No urgency or inability to hold urine Skin: No poor wound healing Neurologic: No numbness or tingling Psychiatric: No depression or anxiety Heme: No bruising Allergic: No reaction to medications or food   Exam: Blood pressure (!) 149/98, pulse (!) 105, temperature 99.1 F (37.3 C), temperature source Oral, resp. rate 20, height 5' 5" (1.651 m), weight  81.6 kg, SpO2 95 %. General: Laying on Biomedical scientist, no acute distress Orientation: Alert and oriented Mood and Affect: Pleasant and cooperative Gait: Not assessed Coordination and balance: Within normal limits  Right lower extremity: Skin without lesions. Obvious effusion to the knee.  Significant tenderness to palpation of the knee.  Tenderness traveling up the thigh and through the lateral hip.  Tolerates a very small amount of knee motion both actively and passively.  No tenderness in the lower leg, ankle, or foot.  Ankle dorsiflexion  plantarflexion is intact.  Sensation intact to light touch distally neurovascularly intact.  Left lower extremity: Skin without lesions. Moderate knee effusion, less impressive than contralateral side. Mild tenderness to palpation of the knee.  Nontender in the thigh, hip, lower leg, foot, ankle.  Tolerates some active knee range of motion.  Sensation is intact to light touch distally.  Ankle dorsiflexion and plantarflexion is intact.  Neurovascularly intact.  Medical Decision Making: Data: Imaging: AP and lateral views of both knees show large joint effusion with osteoarthritic changes bilaterally.  Labs:  Results for orders placed or performed during the hospital encounter of 11/18/19 (from the past 24 hour(s))  CBC with Differential     Status: Abnormal   Collection Time: 11/19/19  3:21 AM  Result Value Ref Range   WBC 11.9 (H) 4.0 - 10.5 K/uL   RBC 4.80 4.22 - 5.81 MIL/uL   Hemoglobin 14.9 13.0 - 17.0 g/dL   HCT 44.6 39.0 - 52.0 %   MCV 92.9 80.0 - 100.0 fL   MCH 31.0 26.0 - 34.0 pg   MCHC 33.4 30.0 - 36.0 g/dL   RDW 16.2 (H) 11.5 - 15.5 %   Platelets 334 150 - 400 K/uL   nRBC 0.0 0.0 - 0.2 %   Neutrophils Relative % 74 %   Neutro Abs 8.9 (H) 1.7 - 7.7 K/uL   Lymphocytes Relative 10 %   Lymphs Abs 1.2 0.7 - 4.0 K/uL   Monocytes Relative 15 %   Monocytes Absolute 1.7 (H) 0.1 - 1.0 K/uL   Eosinophils Relative 0 %   Eosinophils Absolute 0.0 0.0 - 0.5 K/uL   Basophils Relative 0 %   Basophils Absolute 0.0 0.0 - 0.1 K/uL   Immature Granulocytes 1 %   Abs Immature Granulocytes 0.09 (H) 0.00 - 0.07 K/uL  Basic metabolic panel     Status: Abnormal   Collection Time: 11/19/19  3:21 AM  Result Value Ref Range   Sodium 132 (L) 135 - 145 mmol/L   Potassium 3.9 3.5 - 5.1 mmol/L   Chloride 98 98 - 111 mmol/L   CO2 20 (L) 22 - 32 mmol/L   Glucose, Bld 170 (H) 70 - 99 mg/dL   BUN 15 6 - 20 mg/dL   Creatinine, Ser 1.40 (H) 0.61 - 1.24 mg/dL   Calcium 9.4 8.9 - 10.3 mg/dL   GFR calc  non Af Amer 55 (L) >60 mL/min   GFR calc Af Amer >60 >60 mL/min   Anion gap 14 5 - 15  Troponin I (High Sensitivity)     Status: None   Collection Time: 11/19/19  3:21 AM  Result Value Ref Range   Troponin I (High Sensitivity) 10 <18 ng/L  Uric acid     Status: Abnormal   Collection Time: 11/19/19  3:21 AM  Result Value Ref Range   Uric Acid, Serum 10.1 (H) 3.7 - 8.6 mg/dL  Synovial cell count + diff, w/ crystals  Status: Abnormal   Collection Time: 11/19/19  4:09 AM  Result Value Ref Range   Color, Synovial AMBER (A) YELLOW   Appearance-Synovial TURBID (A) CLEAR   Crystals, Fluid INTRACELLULAR MONOSODIUM URATE CRYSTALS    WBC, Synovial 431,000 (H) 0 - 200 /cu mm   Neutrophil, Synovial 93 (H) 0 - 25 %   Lymphocytes-Synovial Fld 6 0 - 20 %   Monocyte-Macrophage-Synovial Fluid 1 (L) 50 - 90 %  Respiratory Panel by RT PCR (Flu A&B, Covid) - Nasopharyngeal Swab     Status: None   Collection Time: 11/19/19  7:04 AM   Specimen: Nasopharyngeal Swab  Result Value Ref Range   SARS Coronavirus 2 by RT PCR NEGATIVE NEGATIVE   Influenza A by PCR NEGATIVE NEGATIVE   Influenza B by PCR NEGATIVE NEGATIVE     Imaging or Labs ordered: ESR, CRP, blood cultures  Medical history and chart was reviewed and case discussed with medical provider.  Assessment/Plan: 58 year old male with history of hypertension, diabetes, asthma, gout presenting with bilateral knee pain and swelling with concern of gout.  Arthrocentesis of right knee revealed white blood cell count of 431,000.  Fluid was  positive for intracellular monosodium urate crystals as well as intracellular calcium pyrophosphate crystals  Recommend proceeding to the operating room this morning for formal incision and drainage of the right knee under anesthesia.  We will plan to aspirate left knee and perform incision and drainage as indicated if fluid appears concerning.  Discussed risks and benefits of the procedure with the patient.  Risks  discussed included continued pain, bleeding, continued infection, and anesthesia complications.  Patient understands these risks and would like to proceed.  Questions were answered, consent was obtained.  Patient will remain n.p.o. for now.  We will hold off on preoperative antibiotics we will plan to dose these after cultures are taken.    A. Carmie Kanner [facility] [phone] (office) [email or web address]

## 2019-11-20 DIAGNOSIS — M009 Pyogenic arthritis, unspecified: Secondary | ICD-10-CM

## 2019-11-20 DIAGNOSIS — I10 Essential (primary) hypertension: Secondary | ICD-10-CM

## 2019-11-20 LAB — BASIC METABOLIC PANEL
Anion gap: 12 (ref 5–15)
BUN: 20 mg/dL (ref 6–20)
CO2: 24 mmol/L (ref 22–32)
Calcium: 8.5 mg/dL — ABNORMAL LOW (ref 8.9–10.3)
Chloride: 98 mmol/L (ref 98–111)
Creatinine, Ser: 1.98 mg/dL — ABNORMAL HIGH (ref 0.61–1.24)
GFR calc Af Amer: 42 mL/min — ABNORMAL LOW (ref >60)
GFR calc non Af Amer: 36 mL/min — ABNORMAL LOW (ref >60)
Glucose, Bld: 156 mg/dL — ABNORMAL HIGH (ref 70–99)
Potassium: 3.7 mmol/L (ref 3.5–5.1)
Sodium: 134 mmol/L — ABNORMAL LOW (ref 135–145)

## 2019-11-20 LAB — MAGNESIUM: Magnesium: 1.8 mg/dL (ref 1.7–2.4)

## 2019-11-20 LAB — GLUCOSE, CAPILLARY
Glucose-Capillary: 138 mg/dL — ABNORMAL HIGH (ref 70–99)
Glucose-Capillary: 299 mg/dL — ABNORMAL HIGH (ref 70–99)
Glucose-Capillary: 212 mg/dL — ABNORMAL HIGH (ref 70–99)
Glucose-Capillary: 206 mg/dL — ABNORMAL HIGH (ref 70–99)

## 2019-11-20 LAB — URIC ACID, BODY FLUID: Uric Acid Body Fluid: 18.9 mg/dL

## 2019-11-20 LAB — PROTEIN, BODY FLUID (OTHER): Total Protein, Body Fluid Other: 4.8 g/dL

## 2019-11-20 LAB — CBC
HCT: 35.7 % — ABNORMAL LOW (ref 39.0–52.0)
Hemoglobin: 11.7 g/dL — ABNORMAL LOW (ref 13.0–17.0)
MCH: 30.5 pg (ref 26.0–34.0)
MCHC: 32.8 g/dL (ref 30.0–36.0)
MCV: 93 fL (ref 80.0–100.0)
Platelets: 314 10*3/uL (ref 150–400)
RBC: 3.84 MIL/uL — ABNORMAL LOW (ref 4.22–5.81)
RDW: 15.9 % — ABNORMAL HIGH (ref 11.5–15.5)
WBC: 9.4 10*3/uL (ref 4.0–10.5)
nRBC: 0 % (ref 0.0–0.2)

## 2019-11-20 LAB — VITAMIN D 25 HYDROXY (VIT D DEFICIENCY, FRACTURES): Vit D, 25-Hydroxy: 11.33 ng/mL — ABNORMAL LOW (ref 30–100)

## 2019-11-20 LAB — PHOSPHORUS: Phosphorus: 4.7 mg/dL — ABNORMAL HIGH (ref 2.5–4.6)

## 2019-11-20 LAB — HEMOGLOBIN A1C
Hgb A1c MFr Bld: 6.9 % — ABNORMAL HIGH (ref 4.8–5.6)
Mean Plasma Glucose: 151.33 mg/dL

## 2019-11-20 LAB — GLUCOSE, BODY FLUID OTHER: Glucose, Body Fluid Other: 40 mg/dL

## 2019-11-20 LAB — MRSA PCR SCREENING: MRSA by PCR: NEGATIVE

## 2019-11-20 MED ORDER — VITAMIN D 25 MCG (1000 UNIT) PO TABS
2000.0000 [IU] | ORAL_TABLET | Freq: Two times a day (BID) | ORAL | Status: DC
  Administered 2019-11-20 – 2019-11-23 (×7): 2000 [IU] via ORAL
  Filled 2019-11-20 (×9): qty 2

## 2019-11-20 MED ORDER — SODIUM CHLORIDE 0.9 % IV SOLN
2.0000 g | INTRAVENOUS | Status: DC
  Administered 2019-11-20 – 2019-11-22 (×3): 2 g via INTRAVENOUS
  Filled 2019-11-20 (×3): qty 2
  Filled 2019-11-20: qty 20

## 2019-11-20 MED ORDER — VANCOMYCIN HCL 1500 MG/300ML IV SOLN
1500.0000 mg | Freq: Once | INTRAVENOUS | Status: AC
  Administered 2019-11-20: 1500 mg via INTRAVENOUS
  Filled 2019-11-20: qty 300

## 2019-11-20 MED ORDER — VANCOMYCIN HCL IN DEXTROSE 1-5 GM/200ML-% IV SOLN
1000.0000 mg | INTRAVENOUS | Status: DC
  Administered 2019-11-21 – 2019-11-22 (×2): 1000 mg via INTRAVENOUS
  Filled 2019-11-20 (×3): qty 200

## 2019-11-20 NOTE — Progress Notes (Signed)
Pharmacy Antibiotic Note  Darryl Hurley is a 58 y.o. male admitted on 11/18/2019 with knee pain - possible septic arthritis - f/u cx data. Tm 100, wbc wnl, Cr 2  Pharmacy has been consulted for Vancomycin dosing dosing.  Plan: Vancomycin 1500mg  x1 then 1gm q24h  Cr 2 est AUC 490  Height: 5\' 5"  (165.1 cm) Weight: 179 lb 14.3 oz (81.6 kg) IBW/kg (Calculated) : 61.5  Temp (24hrs), Avg:98.6 F (37 C), Min:97.9 F (36.6 C), Max:100.2 F (37.9 C)  Recent Labs  Lab 11/19/19 0321 11/19/19 0832 11/19/19 1431 11/20/19 0248  WBC 11.9*  --   --  9.4  CREATININE 1.40*  --  1.94* 1.98*  LATICACIDVEN  --  1.1 1.2  --     Estimated Creatinine Clearance: 40.5 mL/min (A) (by C-G formula based on SCr of 1.98 mg/dL (H)).    Allergies  Allergen Reactions  . Nsaids Other (See Comments)    Vagale down reaction  . Toradol [Ketorolac Tromethamine]     vagale down reaction    Antimicrobials this admission: Ancef x3 doses 12/17 Rocephin 12/18 > Vancomycin 12/18 >  Dose adjustments this admission:   Microbiology results:   Bonnita Nasuti Pharm.D. CPP, BCPS Clinical Pharmacist [phone] 11/20/2019 12:41 PM

## 2019-11-20 NOTE — Progress Notes (Signed)
Patient ID: Darryl Hurley, male   DOB: September 17, 1961, 58 y.o.   MRN: [id number]  PROGRESS NOTE    Darryl Hurley  PFY:[id number] DOB: [birth year] DOA: 11/18/2019 PCP: Patient, No Pcp Per   Brief Narrative:  58 year old male with history of hypertension, COPD, chronic hypoxic respiratory failure on 2 L oxygen at home, diabetes mellitus type 2, gout, chronic low back pain and alcohol abuse presented with bilateral knee swelling and pain.  He underwent joint aspiration by ED of the right knee which showed turbid fluid with 431,000 WBCs with 93% neutrophils.  Orthopedics was consulted.  Patient was started on antibiotics.  He underwent I&D of bilateral septic knee joints by orthopedics on 11/19/2019.  Assessment & Plan:   Sepsis: Present on admission Bilateral knee septic arthritis Leukocytosis: Improved -Presented with bilateral knee pain.  Underwent joint aspiration by ED of the right knee which showed turbid fluid with 431,000 WBCs with 93% neutrophils.  Orthopedics was consulted.  He underwent I&D of bilateral septic knee joints by orthopedics on 11/19/2019.  Follow cultures.  He was started on Ancef.  Follow further orthopedics recommendations. -Pain management as per orthopedics -Doubt that this is acute gout flare.  Will discuss with orthopedics and possibly discontinue prednisone  Chest pain -Questionable cause.  Currently chest pain-free.  EKG without significant ischemic changes.  No significant troponin elevation.  COPD, without acute exacerbation Chronic hypoxic respiratory failure on oxygen 2 L via nasal cannula at home -Stable.  Continue inhalers  Acute kidney injury on chronic kidney disease stage III -Probably from sepsis.  Continue IV fluids.  DC hydrochlorothiazide/losartan and lisinopril.  Hypertension-monitor blood pressure.  Continue amlodipine and clonidine.  Diabetes mellitus type 2 -A1c 6.9.  Hold Metformin.  CBGs with SSI.  Hyperlipidemia -Continue  Crestor  Alcohol abuse -Reports drinking multiple 40 ounce beers per day plus a pint of liquor -Continue CIWA protocol.  Continue thiamine, multivitamin and folate.  Tobacco abuse-we will need counseling regarding cessation   DVT prophylaxis: Lovenox Code Status: Full Family Communication: None at bedside  disposition Plan: Discharge home in 2 to 3 days if cleared by orthopedics  Consultants: Orthopedics  Procedures:  Bilateral knee I&D on 11/19/2019  Antimicrobials:  Anti-infectives (From admission, onward)   Start     Dose/Rate Route Frequency Ordered Stop   11/19/19 1400  ceFAZolin (ANCEF) IVPB 2g/100 mL premix     2 g 200 mL/hr over 30 Minutes Intravenous Every 8 hours 11/19/19 1337 11/20/19 0600   11/19/19 0815  ceFAZolin (ANCEF) IVPB 2g/100 mL premix  Status:  Discontinued     2 g 200 mL/hr over 30 Minutes Intravenous To Short Stay 11/19/19 0802 11/19/19 0840       Subjective: Patient seen and examined at bedside.  Complains of bilateral knee pain and is asking for strong pain medications.  No overnight fever, nausea or vomiting.  Objective: Vitals:   11/20/19 0315 11/20/19 0718 11/20/19 0845 11/20/19 0848  BP: 120/80 112/84    Pulse:  85    Resp: 16 15    Temp: 98.6 F (37 C) 98.7 F (37.1 C)    TempSrc: Oral Oral    SpO2: 98% 98% 100% 100%  Weight:      Height:        Intake/Output Summary (Last 24 hours) at 11/20/2019 1047 Last data filed at 11/20/2019 0600 Gross per 24 hour  Intake 2596.46 ml  Output 255 ml  Net 2341.46 ml   Filed Weights   11/19/19  [id number]  Weight: 81.6 kg    Examination:  General exam: Appears calm and comfortable.  Poor historian. Respiratory system: Bilateral decreased breath sounds at bases Cardiovascular system: S1 & S2 heard, Rate controlled Gastrointestinal system: Abdomen is nondistended, soft and nontender. Normal bowel sounds heard. Extremities: No cyanosis, clubbing, edema.  Bilateral knee dressing  present Central nervous system: Alert and oriented. No focal neurological deficits. Moving extremities Skin: No rashes, lesions or ulcers Psychiatry: Flat affect.    Data Reviewed: I have personally reviewed following labs and imaging studies  CBC: Recent Labs  Lab 11/19/19 0321 11/20/19 0248  WBC 11.9* 9.4  NEUTROABS 8.9*  --   HGB 14.9 11.7*  HCT 44.6 35.7*  MCV 92.9 93.0  PLT 334 314   Basic Metabolic Panel: Recent Labs  Lab 11/19/19 0321 11/19/19 1431 11/20/19 0248  NA 132* 135 134*  K 3.9 3.8 3.7  CL 98 99 98  CO2 20* 23 24  GLUCOSE 170* 166* 156*  BUN 15 19 20   CREATININE 1.40* 1.94* 1.98*  CALCIUM 9.4 9.0 8.5*  MG  --   --  1.8  PHOS  --   --  4.7*   GFR: Estimated Creatinine Clearance: 40.5 mL/min (A) (by C-G formula based on SCr of 1.98 mg/dL (H)). Liver Function Tests: Recent Labs  Lab 11/19/19 1431  AST 18  ALT 14  ALKPHOS 61  BILITOT 1.3*  PROT 7.0  ALBUMIN 2.7*   No results for input(s): LIPASE, AMYLASE in the last 168 hours. No results for input(s): AMMONIA in the last 168 hours. Coagulation Profile: No results for input(s): INR, PROTIME in the last 168 hours. Cardiac Enzymes: No results for input(s): CKTOTAL, CKMB, CKMBINDEX, TROPONINI in the last 168 hours. BNP (last 3 results) No results for input(s): PROBNP in the last 8760 hours. HbA1C: Recent Labs    11/20/19 0248  HGBA1C 6.9*   CBG: Recent Labs  Lab 11/19/19 1116 11/20/19 0621  GLUCAP 179* 138*   Lipid Profile: No results for input(s): CHOL, HDL, LDLCALC, TRIG, CHOLHDL, LDLDIRECT in the last 72 hours. Thyroid Function Tests: No results for input(s): TSH, T4TOTAL, FREET4, T3FREE, THYROIDAB in the last 72 hours. Anemia Panel: No results for input(s): VITAMINB12, FOLATE, FERRITIN, TIBC, IRON, RETICCTPCT in the last 72 hours. Sepsis Labs: Recent Labs  Lab 11/19/19 [id number] 11/19/19 1431  LATICACIDVEN 1.1 1.2    Recent Results (from the past 240 hour(s))  MRSA PCR  Screening     Status: None   Collection Time: 11/19/19 12:37 AM   Specimen: Nasal Mucosa; Nasopharyngeal  Result Value Ref Range Status   MRSA by PCR NEGATIVE NEGATIVE Final    Comment:        The GeneXpert MRSA Assay (FDA approved for NASAL specimens only), is one component of a comprehensive MRSA colonization surveillance program. It is not intended to diagnose MRSA infection nor to guide or monitor treatment for MRSA infections. Performed at [facility], [address] [place], [place] [address]   Body fluid culture     Status: None (Preliminary result)   Collection Time: 11/19/19  4:22 AM   Specimen: Body Fluid  Result Value Ref Range Status   Specimen Description FLUID SYNOVIAL KNEE  Final   Special Requests NONE  Final   Gram Stain   Final    ABUNDANT WBC PRESENT, PREDOMINANTLY PMN NO ORGANISMS SEEN    Culture   Final    NO GROWTH 1 DAY Performed at [facility], [address], [place], [place] [address]    Report Status PENDING  Incomplete  Respiratory Panel by RT PCR (Flu A&B, Covid) - Nasopharyngeal Swab     Status: None   Collection Time: 11/19/19  7:04 AM   Specimen: Nasopharyngeal Swab  Result Value Ref Range Status   SARS Coronavirus 2 by RT PCR NEGATIVE NEGATIVE Final    Comment: (NOTE) SARS-CoV-2 target nucleic acids are NOT DETECTED. The SARS-CoV-2 RNA is generally detectable in upper respiratoy specimens during the acute phase of infection. The lowest concentration of SARS-CoV-2 viral copies this assay can detect is 131 copies/mL. A negative result does not preclude SARS-Cov-2 infection and should not be used as the sole basis for treatment or other patient management decisions. A negative result may occur with  improper specimen collection/handling, submission of specimen other than nasopharyngeal swab, presence of viral mutation(s) within the areas targeted by this assay, and inadequate number of viral copies (<131 copies/mL). A  negative result must be combined with clinical observations, patient history, and epidemiological information. The expected result is Negative. Fact Sheet for Patients:  PinkCheek.be Fact Sheet for Healthcare Providers:  GravelBags.it This test is not yet ap proved or cleared by the [place] FDA and  has been authorized for detection and/or diagnosis of SARS-CoV-2 by FDA under an Emergency Use Authorization (EUA). This EUA will remain  in effect (meaning this test can be used) for the duration of the COVID-19 declaration under Section 564(b)(1) of the Act, 21 U.S.C. section 360bbb-3(b)(1), unless the authorization is terminated or revoked sooner.    Influenza A by PCR NEGATIVE NEGATIVE Final   Influenza B by PCR NEGATIVE NEGATIVE Final    Comment: (NOTE) The Xpert Xpress SARS-CoV-2/FLU/RSV assay is intended as an aid in  the diagnosis of influenza from Nasopharyngeal swab specimens and  should not be used as a sole basis for treatment. Nasal washings and  aspirates are unacceptable for Xpert Xpress SARS-CoV-2/FLU/RSV  testing. Fact Sheet for Patients: PinkCheek.be Fact Sheet for Healthcare Providers: GravelBags.it This test is not yet approved or cleared by the [place] FDA and  has been authorized for detection and/or diagnosis of SARS-CoV-2 by  FDA under an Emergency Use Authorization (EUA). This EUA will remain  in effect (meaning this test can be used) for the duration of the  Covid-19 declaration under Section 564(b)(1) of the Act, 21  U.S.C. section 360bbb-3(b)(1), unless the authorization is  terminated or revoked. Performed at [facility], [place] [address], [place], [place] [address]   Aerobic/Anaerobic Culture (surgical/deep wound)     Status: None (Preliminary result)   Collection Time: 11/19/19 10:23 AM   Specimen: PATH Other; Tissue   Result Value Ref Range Status   Specimen Description WOUND RIGHT KNEE  Final   Special Requests NONE  Final   Gram Stain   Final    FEW WBC PRESENT,BOTH PMN AND MONONUCLEAR NO ORGANISMS SEEN Performed at [facility], [address] [place],  [address]    Culture PENDING  Incomplete   Report Status PENDING  Incomplete  Aerobic/Anaerobic Culture (surgical/deep wound)     Status: None (Preliminary result)   Collection Time: 11/19/19 10:25 AM   Specimen: PATH Other; Tissue  Result Value Ref Range Status   Specimen Description WOUND LEFT KNEE  Final   Special Requests NONE  Final   Gram Stain   Final    FEW WBC PRESENT,BOTH PMN AND MONONUCLEAR NO ORGANISMS SEEN Performed  [facility], [address], [place], [place] [address]    Culture PENDING  Incomplete   Report Status PENDING  Incomplete         Radiology Studies: DG Knee 1-2 Views Left  Result Date: 11/19/2019 CLINICAL DATA:  Pain and swelling EXAM: LEFT KNEE - 1-2 VIEW COMPARISON:  December 25, 2018 FINDINGS: Frontal and lateral views were obtained. No fracture or dislocation. There is a large joint effusion. There is moderate generalized joint space narrowing with slight intra-articular calcification medially. There is spurring in all compartments. No erosive change. There are foci of calcification in the superficial femoral and popliteal arteries. IMPRESSION: 1.  Large joint effusion.  No fracture or dislocation. 2.  Generalized osteoarthritic change, similar to prior study. 3.  Multiple foci of arterial vascular calcification. Electronically Signed   By: Bretta Bang III M.D.   On: 11/19/2019 07:27   DG Knee 1-2 Views Right  Result Date: 11/19/2019 CLINICAL DATA:  Pain and swelling EXAM: RIGHT KNEE - 1-2 VIEW COMPARISON:  January 20, 2013 FINDINGS: Frontal and lateral views were obtained. No fracture or dislocation. There is a large joint effusion. There is relatively mild narrowing medially  and in the patellofemoral joint region. There is mild spurring in all compartments. There is prepatellar soft tissue swelling with calcification in this area inferiorly, a finding also present previously. There are foci of arterial vascular calcification in the superficial femoral artery and popliteal artery regions. IMPRESSION: 1.  Large joint effusion.  No acute fracture or dislocation. 2. Prepatellar soft tissue swelling with calcification inferiorly in this area. Question residua of prior hematoma versus prepatellar inflammatory lesion. Clinical assessment of this area advised. 3. Osteoarthritic change, primarily medially and in the patellofemoral joint. 4.  Foci of arterial vascular calcification. Electronically Signed   By: Bretta Bang III M.D.   On: 11/19/2019 07:25   DG Chest Port 1 View  Result Date: 11/19/2019 CLINICAL DATA:  Chest pain EXAM: PORTABLE CHEST 1 VIEW COMPARISON:  10/02/2019 FINDINGS: Normal heart size and stable aortic contours. Stable low volume chest. There is no edema, consolidation, effusion, or pneumothorax. IMPRESSION: No evidence of active disease. Electronically Signed   By: Marnee Spring M.D.   On: 11/19/2019 04:40        Scheduled Meds: . acetaminophen  650 mg Oral Q6H  . amLODipine  10 mg Oral Daily  . arformoterol  15 mcg Nebulization BID  . aspirin EC  81 mg Oral Daily  . budesonide (PULMICORT) nebulizer solution  0.5 mg Nebulization BID  . cloNIDine  0.1 mg Oral BID  . enoxaparin (LOVENOX) injection  40 mg Subcutaneous Q24H  . folic acid  1 mg Oral Daily  . gabapentin  300 mg Oral BID  . insulin aspart  0-9 Units Subcutaneous TID WC  . LORazepam  0-4 mg Intravenous Q6H   Followed by  . [START ON 11/21/2019] LORazepam  0-4 mg Intravenous Q12H  . multivitamin with minerals  1 tablet Oral Daily  . predniSONE  40 mg Oral Q breakfast  . rosuvastatin  40 mg Oral Daily  . sodium chloride flush  3 mL Intravenous Q12H  . thiamine  100 mg Oral Daily    Continuous Infusions: . sodium chloride Stopped (11/20/19 0530)          Glade Lloyd, MD [facility] 11/20/2019, 10:47 AM

## 2019-11-20 NOTE — Progress Notes (Signed)
Orthopaedic Trauma Progress Note  S: Doing okay today, notes improvement in bilateral knee pain from preoperatively.  No gross in the last 24 hours on cultures taken intraoperatively.  Has not been up with therapy yet but is eager to move around and try to walk with a walker.  Vit D level on postoperative labs was 11, will start Vit D3 supplementation today.  O:  Vitals:   11/20/19 0848 11/20/19 1052  BP:  111/82  Pulse:  84  Resp:  (!) 21  Temp:  98.4 F (36.9 C)  SpO2: 100% 96%    General -sitting up in bed, no acute distress.  Awake alert and oriented.  Pleasant and cooperative.  Respiratory - No increased work of breathing.  Right Lower Extremity - Dressing over knee is clean dry and intact.  Hemovac in place with minimal output in the canister but there is some serosanguineous drainage in the tubing.  Tender with palpation of the knee.  Tenderness in the thigh and hip from preoperative exam has improved.  Nontender in the lower leg.  Ankle dorsiflexion plantarflexion is intact.  Able to actively flex the knee a small amount.  Sensation is intact to light touch distally.  Neurovascularly intact.  Left Lower Extremity - Dressing over knee is clean dry and intact.  Hemovac in place with small amount of output in the canister, some serosanguineous/bloody drainage in the tubing.  Tender with palpation of the knee.  Nontender in the lower leg, thigh, hip.  Ankle dorsiflexion plantarflexion is intact.  Tolerates some flexion of the knee. ensation is intact to light touch distally.  Neurovascularly intact.  Labs:  Results for orders placed or performed during the hospital encounter of 11/18/19 (from the past 24 hour(s))  Lactic acid, plasma     Status: None   Collection Time: 11/19/19  2:31 PM  Result Value Ref Range   Lactic Acid, Venous 1.2 0.5 - 1.9 mmol/L  Comprehensive metabolic panel     Status: Abnormal   Collection Time: 11/19/19  2:31 PM  Result Value Ref Range   Sodium 135 135 -  145 mmol/L   Potassium 3.8 3.5 - 5.1 mmol/L   Chloride 99 98 - 111 mmol/L   CO2 23 22 - 32 mmol/L   Glucose, Bld 166 (H) 70 - 99 mg/dL   BUN 19 6 - 20 mg/dL   Creatinine, Ser 9.38 (H) 0.61 - 1.24 mg/dL   Calcium 9.0 8.9 - 18.2 mg/dL   Total Protein 7.0 6.5 - 8.1 g/dL   Albumin 2.7 (L) 3.5 - 5.0 g/dL   AST 18 15 - 41 U/L   ALT 14 0 - 44 U/L   Alkaline Phosphatase 61 38 - 126 U/L   Total Bilirubin 1.3 (H) 0.3 - 1.2 mg/dL   GFR calc non Af Amer 37 (L) >60 mL/min   GFR calc Af Amer 43 (L) >60 mL/min   Anion gap 13 5 - 15  CBC     Status: Abnormal   Collection Time: 11/20/19  2:48 AM  Result Value Ref Range   WBC 9.4 4.0 - 10.5 K/uL   RBC 3.84 (L) 4.22 - 5.81 MIL/uL   Hemoglobin 11.7 (L) 13.0 - 17.0 g/dL   HCT 99.3 (L) 71.6 - 96.7 %   MCV 93.0 80.0 - 100.0 fL   MCH 30.5 26.0 - 34.0 pg   MCHC 32.8 30.0 - 36.0 g/dL   RDW 89.3 (H) 81.0 - 17.5 %   Platelets 314  150 - 400 K/uL   nRBC 0.0 0.0 - 0.2 %  Basic metabolic panel     Status: Abnormal   Collection Time: 11/20/19  2:48 AM  Result Value Ref Range   Sodium 134 (L) 135 - 145 mmol/L   Potassium 3.7 3.5 - 5.1 mmol/L   Chloride 98 98 - 111 mmol/L   CO2 24 22 - 32 mmol/L   Glucose, Bld 156 (H) 70 - 99 mg/dL   BUN 20 6 - 20 mg/dL   Creatinine, Ser 1.98 (H) 0.61 - 1.24 mg/dL   Calcium 8.5 (L) 8.9 - 10.3 mg/dL   GFR calc non Af Amer 36 (L) >60 mL/min   GFR calc Af Amer 42 (L) >60 mL/min   Anion gap 12 5 - 15  VITAMIN D 25 Hydroxy (Vit-D Deficiency, Fractures)     Status: Abnormal   Collection Time: 11/20/19  2:48 AM  Result Value Ref Range   Vit D, 25-Hydroxy 11.33 (L) 30 - 100 ng/mL  Magnesium     Status: None   Collection Time: 11/20/19  2:48 AM  Result Value Ref Range   Magnesium 1.8 1.7 - 2.4 mg/dL  Phosphorus     Status: Abnormal   Collection Time: 11/20/19  2:48 AM  Result Value Ref Range   Phosphorus 4.7 (H) 2.5 - 4.6 mg/dL  Hemoglobin A1c     Status: Abnormal   Collection Time: 11/20/19  2:48 AM  Result Value Ref  Range   Hgb A1c MFr Bld 6.9 (H) 4.8 - 5.6 %   Mean Plasma Glucose 151.33 mg/dL  Glucose, capillary     Status: Abnormal   Collection Time: 11/20/19  6:21 AM  Result Value Ref Range   Glucose-Capillary 138 (H) 70 - 99 mg/dL   Comment 1 Notify RN    Comment 2 Document in Chart     Assessment: 58 year old male with bilateral septic knee arthritis s/p I&D 11/19/2019   Weightbearing: WBAT BLE  Insicional and dressing care: Dressings should be changed PRN  CV/Blood loss: Acute blood loss anemia, Hgb 11.7 this AM. Hemodynamically stable  Pain management:  1. Tylenol 650 mg q 6 hours scheduled 2. Robaxin 500 mg q 6 hours PRN 3. Oxycodone 5 mg q 4 hours PRN 4. Neurontin 300 mg BID 5. Morphine 2 mg q 2 hours PRN  VTE prophylaxis: Lovenox while in the hospital, discharge on ASA 325 daily  ID: per primary team  Medical co-morbidities: HTN, hx of gout, DM, tobacco abuse, alcohol abuse  Dispo: PT eval. Change dressings and pull hemovac drains prior to discharge.  Recommend 5,000 IU Vitamin D3 supplementation daily at discharge. Will need ASA 325 for DVT prophylaxis at discharge  Follow - up plan: 2 weeks for suture removal  Contact information:  Katha Hamming MD, Patrecia Pace PA-C   [facility] [phone] (office) [email or web address]

## 2019-11-20 NOTE — Progress Notes (Addendum)
CSW met with the patient at bedside. Patient denied substance use. CSW provided resources and thanked the patient for his time.   Patient asked for a wheel chair.   CSW will continue to follow and assist with TOC needs.   Domenic Schwab, MSW, [place] Worker [facility]  [phone]

## 2019-11-21 LAB — BASIC METABOLIC PANEL
Anion gap: 11 (ref 5–15)
BUN: 26 mg/dL — ABNORMAL HIGH (ref 6–20)
CO2: 22 mmol/L (ref 22–32)
Calcium: 9 mg/dL (ref 8.9–10.3)
Chloride: 105 mmol/L (ref 98–111)
Creatinine, Ser: 1.37 mg/dL — ABNORMAL HIGH (ref 0.61–1.24)
GFR calc Af Amer: >60 mL/min (ref >60)
GFR calc non Af Amer: 57 mL/min — ABNORMAL LOW (ref >60)
Glucose, Bld: 152 mg/dL — ABNORMAL HIGH (ref 70–99)
Potassium: 4.2 mmol/L (ref 3.5–5.1)
Sodium: 138 mmol/L (ref 135–145)

## 2019-11-21 LAB — GLUCOSE, CAPILLARY
Glucose-Capillary: 150 mg/dL — ABNORMAL HIGH (ref 70–99)
Glucose-Capillary: 131 mg/dL — ABNORMAL HIGH (ref 70–99)
Glucose-Capillary: 156 mg/dL — ABNORMAL HIGH (ref 70–99)
Glucose-Capillary: 145 mg/dL — ABNORMAL HIGH (ref 70–99)

## 2019-11-21 LAB — CBC WITH DIFFERENTIAL/PLATELET
Abs Immature Granulocytes: 0.07 10*3/uL (ref 0.00–0.07)
Basophils Absolute: 0 10*3/uL (ref 0.0–0.1)
Basophils Relative: 0 %
Eosinophils Absolute: 0 10*3/uL (ref 0.0–0.5)
Eosinophils Relative: 0 %
HCT: 33.3 % — ABNORMAL LOW (ref 39.0–52.0)
Hemoglobin: 10.8 g/dL — ABNORMAL LOW (ref 13.0–17.0)
Immature Granulocytes: 1 %
Lymphocytes Relative: 8 %
Lymphs Abs: 1 10*3/uL (ref 0.7–4.0)
MCH: 30.4 pg (ref 26.0–34.0)
MCHC: 32.4 g/dL (ref 30.0–36.0)
MCV: 93.8 fL (ref 80.0–100.0)
Monocytes Absolute: 1.3 10*3/uL — ABNORMAL HIGH (ref 0.1–1.0)
Monocytes Relative: 10 %
Neutro Abs: 10.5 10*3/uL — ABNORMAL HIGH (ref 1.7–7.7)
Neutrophils Relative %: 81 %
Platelets: 327 10*3/uL (ref 150–400)
RBC: 3.55 MIL/uL — ABNORMAL LOW (ref 4.22–5.81)
RDW: 15.9 % — ABNORMAL HIGH (ref 11.5–15.5)
WBC: 13 10*3/uL — ABNORMAL HIGH (ref 4.0–10.5)
nRBC: 0 % (ref 0.0–0.2)

## 2019-11-21 LAB — C-REACTIVE PROTEIN: CRP: 22.8 mg/dL — ABNORMAL HIGH (ref <1.0)

## 2019-11-21 LAB — MAGNESIUM: Magnesium: 2.1 mg/dL (ref 1.7–2.4)

## 2019-11-21 NOTE — Progress Notes (Addendum)
Patient refuses labs to be drawn this am.  [phone] Patient allowed phlebotomist to perform lab draws at this time.

## 2019-11-21 NOTE — Plan of Care (Signed)
Patient is progressing as expected. 

## 2019-11-21 NOTE — Progress Notes (Signed)
Patient ID: Darryl Florasnthony R Cermak, male   DOB: 1961/11/02, 58 y.o.   MRN: [id number]  PROGRESS NOTE    Darryl Hurley  WUJ:[id number] DOB: 1961/11/02 DOA: 11/18/2019 PCP: Patient, No Pcp Per   Brief Narrative:  58 year old male with history of hypertension, COPD, chronic hypoxic respiratory failure on 2 L oxygen at home, diabetes mellitus type 2, gout, chronic low back pain and alcohol abuse presented with bilateral knee swelling and pain.  He underwent joint aspiration by ED of the right knee which showed turbid fluid with 431,000 WBCs with 93% neutrophils.  Orthopedics was consulted.  Patient was started on antibiotics.  He underwent I&D of bilateral septic knee joints by orthopedics on 11/19/2019.  Assessment & Plan:   Sepsis: Present on admission Bilateral knee septic arthritis Leukocytosis: Improved -Presented with bilateral knee pain.  Underwent joint aspiration by ED of the right knee which showed turbid fluid with 431,000 WBCs with 93% neutrophils.  Orthopedics was consulted.  He underwent I&D of bilateral septic knee joints by orthopedics on 11/19/2019.  Follow cultures.  He was started on Ancef.  Subsequently antibiotics were switched to Rocephin and vancomycin on 11/20/2019. follow further orthopedics recommendations. -Pain management as per orthopedics -Doubt that this is acute gout flare.  Discontinued prednisone. -CRP improving but significantly elevated at 22.8 today.  Leukocytosis -From above.  Monitor.  Chest pain -Questionable cause.  Currently chest pain-free.  EKG without significant ischemic changes.  No significant troponin elevation.  COPD, without acute exacerbation Chronic hypoxic respiratory failure on oxygen 2 L via nasal cannula at home -Stable.  Continue inhalers  Acute kidney injury on chronic kidney disease stage III -Probably from sepsis.  DC'd hydrochlorothiazide/losartan and lisinopril. -Creatinine 1.37 today.  Decrease normal saline to 75 cc an  hour.  Hypertension-monitor blood pressure.  Continue amlodipine and clonidine.  Diabetes mellitus type 2 uncontrolled with hyperglycemia -A1c 6.9.  Hold Metformin.  CBGs with SSI.  Hyperlipidemia -Continue Crestor  Alcohol abuse -Reports drinking multiple 40 ounce beers per day plus a pint of liquor -Continue CIWA protocol.  Continue thiamine, multivitamin and folate.  Tobacco abuse- will need counseling regarding cessation  Generalized deconditioning-PT eval   DVT prophylaxis: Lovenox Code Status: Full Family Communication: None at bedside  disposition Plan: Discharge home in 2 to 3 days if cleared by orthopedics  Consultants: Orthopedics  Procedures:  Bilateral knee I&D on 11/19/2019  Antimicrobials:  Anti-infectives (From admission, onward)   Start     Dose/Rate Route Frequency Ordered Stop   11/21/19 1200  vancomycin (VANCOCIN) IVPB 1000 mg/200 mL premix     1,000 mg 200 mL/hr over 60 Minutes Intravenous Every 24 hours 11/20/19 1221     11/20/19 1230  vancomycin (VANCOREADY) IVPB 1500 mg/300 mL     1,500 mg 150 mL/hr over 120 Minutes Intravenous  Once 11/20/19 1221 11/20/19 1628   11/20/19 1200  cefTRIAXone (ROCEPHIN) 2 g in sodium chloride 0.9 % 100 mL IVPB     2 g 200 mL/hr over 30 Minutes Intravenous Every 24 hours 11/20/19 1104     11/19/19 1400  ceFAZolin (ANCEF) IVPB 2g/100 mL premix     2 g 200 mL/hr over 30 Minutes Intravenous Every 8 hours 11/19/19 1337 11/20/19 0600   11/19/19 0815  ceFAZolin (ANCEF) IVPB 2g/100 mL premix  Status:  Discontinued     2 g 200 mL/hr over 30 Minutes Intravenous To Short Stay 11/19/19 0802 11/19/19 0840       Subjective: Patient seen and examined  at bedside.  No overnight fever, vomiting, worsening knee pain. Objective: Vitals:   11/20/19 2036 11/20/19 2336 11/21/19 0247 11/21/19 0400  BP:  109/74 133/89 133/89  Pulse:  79 62 64  Resp:  18 13   Temp:  98.4 F (36.9 C) 98.4 F (36.9 C)   TempSrc:  Oral Oral    SpO2: 100% 98% 96%   Weight:      Height:        Intake/Output Summary (Last 24 hours) at 11/21/2019 0740 Last data filed at 11/21/2019 0536 Gross per 24 hour  Intake 3048 ml  Output 1960 ml  Net 1088 ml   Filed Weights   11/19/19 0313  Weight: 81.6 kg    Examination:  General exam: No acute distress.  Poor historian.   Respiratory system: Bilateral decreased breath sounds at bases, no wheezing Cardiovascular system: Rate controlled, S1-S2 heard Gastrointestinal system: Abdomen is nondistended, soft and nontender. Normal bowel sounds heard. Extremities: No cyanosis, edema.  Bilateral knee dressing present  Data Reviewed: I have personally reviewed following labs and imaging studies  CBC: Recent Labs  Lab 11/19/19 0321 11/20/19 0248 11/21/19 0652  WBC 11.9* 9.4 13.0*  NEUTROABS 8.9*  --  10.5*  HGB 14.9 11.7* 10.8*  HCT 44.6 35.7* 33.3*  MCV 92.9 93.0 93.8  PLT 334 314 327   Basic Metabolic Panel: Recent Labs  Lab 11/19/19 0321 11/19/19 1431 11/20/19 0248  NA 132* 135 134*  K 3.9 3.8 3.7  CL 98 99 98  CO2 20* 23 24  GLUCOSE 170* 166* 156*  BUN 15 19 20   CREATININE 1.40* 1.94* 1.98*  CALCIUM 9.4 9.0 8.5*  MG  --   --  1.8  PHOS  --   --  4.7*   GFR: Estimated Creatinine Clearance: 40.5 mL/min (A) (by C-G formula based on SCr of 1.98 mg/dL (H)). Liver Function Tests: Recent Labs  Lab 11/19/19 1431  AST 18  ALT 14  ALKPHOS 61  BILITOT 1.3*  PROT 7.0  ALBUMIN 2.7*   No results for input(s): LIPASE, AMYLASE in the last 168 hours. No results for input(s): AMMONIA in the last 168 hours. Coagulation Profile: No results for input(s): INR, PROTIME in the last 168 hours. Cardiac Enzymes: No results for input(s): CKTOTAL, CKMB, CKMBINDEX, TROPONINI in the last 168 hours. BNP (last 3 results) No results for input(s): PROBNP in the last 8760 hours. HbA1C: Recent Labs    11/20/19 0248  HGBA1C 6.9*   CBG: Recent Labs  Lab 11/20/19 0621  11/20/19 1349 11/20/19 1556 11/20/19 2100 11/21/19 0627  GLUCAP 138* 299* 212* 206* 150*   Lipid Profile: No results for input(s): CHOL, HDL, LDLCALC, TRIG, CHOLHDL, LDLDIRECT in the last 72 hours. Thyroid Function Tests: No results for input(s): TSH, T4TOTAL, FREET4, T3FREE, THYROIDAB in the last 72 hours. Anemia Panel: No results for input(s): VITAMINB12, FOLATE, FERRITIN, TIBC, IRON, RETICCTPCT in the last 72 hours. Sepsis Labs: Recent Labs  Lab 11/19/19 11/21/19 11/19/19 1431  LATICACIDVEN 1.1 1.2    Recent Results (from the past 240 hour(s))  MRSA PCR Screening     Status: None   Collection Time: 11/19/19 12:37 AM   Specimen: Nasal Mucosa; Nasopharyngeal  Result Value Ref Range Status   MRSA by PCR NEGATIVE NEGATIVE Final    Comment:        The GeneXpert MRSA Assay (FDA approved for NASAL specimens only), is one component of a comprehensive MRSA colonization surveillance program. It is not intended to  diagnose MRSA infection nor to guide or monitor treatment for MRSA infections. Performed at [facility], [address], [place], [place] [address]   Body fluid culture     Status: None (Preliminary result)   Collection Time: 11/19/19  4:22 AM   Specimen: Body Fluid  Result Value Ref Range Status   Specimen Description FLUID SYNOVIAL KNEE  Final   Special Requests NONE  Final   Gram Stain   Final    ABUNDANT WBC PRESENT, PREDOMINANTLY PMN NO ORGANISMS SEEN    Culture   Final    NO GROWTH 1 DAY Performed at [facility], [address], [place], [place] [address]    Report Status PENDING  Incomplete  Respiratory Panel by RT PCR (Flu A&B, Covid) - Nasopharyngeal Swab     Status: None   Collection Time: 11/19/19  7:04 AM   Specimen: Nasopharyngeal Swab  Result Value Ref Range Status   SARS Coronavirus 2 by RT PCR NEGATIVE NEGATIVE Final    Comment: (NOTE) SARS-CoV-2 target nucleic acids are NOT DETECTED. The SARS-CoV-2 RNA is generally detectable  in upper respiratoy specimens during the acute phase of infection. The lowest concentration of SARS-CoV-2 viral copies this assay can detect is 131 copies/mL. A negative result does not preclude SARS-Cov-2 infection and should not be used as the sole basis for treatment or other patient management decisions. A negative result may occur with  improper specimen collection/handling, submission of specimen other than nasopharyngeal swab, presence of viral mutation(s) within the areas targeted by this assay, and inadequate number of viral copies (<131 copies/mL). A negative result must be combined with clinical observations, patient history, and epidemiological information. The expected result is Negative. Fact Sheet for Patients:  https://www.moore.com/ Fact Sheet for Healthcare Providers:  https://www.young.biz/ This test is not yet ap proved or cleared by the [place] FDA and  has been authorized for detection and/or diagnosis of SARS-CoV-2 by FDA under an Emergency Use Authorization (EUA). This EUA will remain  in effect (meaning this test can be used) for the duration of the COVID-19 declaration under Section 564(b)(1) of the Act, 21 U.S.C. section 360bbb-3(b)(1), unless the authorization is terminated or revoked sooner.    Influenza A by PCR NEGATIVE NEGATIVE Final   Influenza B by PCR NEGATIVE NEGATIVE Final    Comment: (NOTE) The Xpert Xpress SARS-CoV-2/FLU/RSV assay is intended as an aid in  the diagnosis of influenza from Nasopharyngeal swab specimens and  should not be used as a sole basis for treatment. Nasal washings and  aspirates are unacceptable for Xpert Xpress SARS-CoV-2/FLU/RSV  testing. Fact Sheet for Patients: https://www.moore.com/ Fact Sheet for Healthcare Providers: https://www.young.biz/ This test is not yet approved or cleared by the [place] FDA and  has been authorized for  detection and/or diagnosis of SARS-CoV-2 by  FDA under an Emergency Use Authorization (EUA). This EUA will remain  in effect (meaning this test can be used) for the duration of the  Covid-19 declaration under Section 564(b)(1) of the Act, 21  U.S.C. section 360bbb-3(b)(1), unless the authorization is  terminated or revoked. Performed at [facility], [address] [place], [place] [address]   Culture, blood (routine x 2)     Status: None (Preliminary result)   Collection Time: 11/19/19  9:00 AM   Specimen: BLOOD  Result Value Ref Range Status   Specimen Description BLOOD SITE NOT SPECIFIED  Final   Special Requests   Final  BOTTLES DRAWN AEROBIC AND ANAEROBIC Blood Culture results may not be optimal due to an excessive volume of blood received in culture bottles   Culture   Final    NO GROWTH 1 DAY Performed at [place] [address] [place], [place] [address]    Report Status PENDING  Incomplete  Culture, blood (routine x 2)     Status: None (Preliminary result)   Collection Time: 11/19/19  9:00 AM   Specimen: BLOOD  Result Value Ref Range Status   Specimen Description BLOOD SITE NOT SPECIFIED  Final   Special Requests   Final    BOTTLES DRAWN AEROBIC AND ANAEROBIC Blood Culture results may not be optimal due to an excessive volume of blood received in culture bottles   Culture   Final    NO GROWTH 1 DAY Performed at [facility], [place] [address] [place], [place] [address]    Report Status PENDING  Incomplete  Aerobic/Anaerobic Culture (surgical/deep wound)     Status: None (Preliminary result)   Collection Time: 11/19/19 10:23 AM   Specimen: PATH Other; Tissue  Result Value Ref Range Status   Specimen Description WOUND RIGHT KNEE  Final   Special Requests NONE  Final   Gram Stain   Final    FEW WBC PRESENT,BOTH PMN AND MONONUCLEAR NO ORGANISMS SEEN    Culture   Final    NO GROWTH < 24 HOURS Performed at [facility], [place] [address], [place], [place] [address]    Report Status PENDING  Incomplete  Aerobic/Anaerobic Culture (surgical/deep wound)     Status: None (Preliminary result)   Collection Time: 11/19/19 10:25 AM   Specimen: PATH Other; Tissue  Result Value Ref Range Status   Specimen Description WOUND LEFT KNEE  Final   Special Requests NONE  Final   Gram Stain   Final    FEW WBC PRESENT,BOTH PMN AND MONONUCLEAR NO ORGANISMS SEEN    Culture   Final    NO GROWTH < 24 HOURS Performed at [facility], [place] [address] [place],  [address]    Report Status PENDING  Incomplete         Radiology Studies: No results found.      Scheduled Meds: . acetaminophen  650 mg Oral Q6H  . amLODipine  10 mg Oral Daily  . arformoterol  15 mcg Nebulization BID  . aspirin EC  81 mg Oral Daily  . budesonide (PULMICORT) nebulizer solution  0.5 mg Nebulization BID  . cholecalciferol  2,000 Units Oral BID  . cloNIDine  0.1 mg Oral BID  . enoxaparin (LOVENOX) injection  40 mg Subcutaneous Q24H  . folic acid  1 mg Oral Daily  . gabapentin  300 mg Oral BID  . insulin aspart  0-9 Units Subcutaneous TID WC  . LORazepam  0-4 mg Intravenous Q6H   Followed by  . LORazepam  0-4 mg Intravenous Q12H  . multivitamin with minerals  1 tablet Oral Daily  . rosuvastatin  40 mg Oral Daily  . sodium chloride flush  3 mL Intravenous Q12H  . thiamine  100 mg Oral Daily   Continuous Infusions: . sodium chloride 100 mL/hr at 11/21/19 0547  . cefTRIAXone (ROCEPHIN)  IV 2 g (11/20/19 1332)  . vancomycin            Aline August, MD [facility] 11/21/2019, 7:40 AM

## 2019-11-21 NOTE — Progress Notes (Addendum)
Paged orthopedic office via [place] since pt's concern, nobody has rounded to him regarding his knee surgery today so far. Person answering call said that they will notify on call. I see the notes yesterday from [place] and dressing change is PRN. On assessment pt's dressing status is Clean, dry and intact, drainage output is 0 since morning. Pt is getting out of the bed and to the chair, worked with PT as also.  Pt denies pain, IV fluid continue @ 75 cc/hr, will continue to monitor the patient.  Ellionna Buckbee. RN

## 2019-11-21 NOTE — Evaluation (Signed)
Physical Therapy Evaluation Patient Details Name: Darryl Hurley MRN: [id number] DOB: 1961-02-19 Today's Date: 11/21/2019   History of Present Illness  58 year old male with history of hypertension, COPD, chronic hypoxic respiratory failure on 2 L oxygen at home, diabetes mellitus type 2, gout, chronic low back pain and alcohol abuse presented with bilateral knee swelling and pain.  He underwent joint aspiration by ED of the right knee which showed turbid fluid with 431,000 WBCs with 93% neutrophils.  Orthopedics was consulted.  Patient was started on antibiotics.  He underwent I&D of bilateral septic knee joints by orthopedics on 11/19/2019.  Clinical Impression  Pt presents to PT with deficits in ROM, strength, power, gait, balance, functional mobility, endurance, and with bilateral knee pain. Pt requires significant assistance to perform sit to stand at this time due to strength deficits and well as knee flexion deficits. Pt is able to ambulate with short step to gait and close guard from PT utilizing RW, increased falls risk due to gait and strength deficits. Pt will benefit from acute PT POC to reduce assistance requirements and aide in a return to independent mobility. PT recommends discharge home with HHPT, RW, and assistance of significant other (pt will likely need 1-2 more acute PT visits and aggressive mobilization from nursing staff).    Follow Up Recommendations Home health PT;Supervision/Assistance - 24 hour    Equipment Recommendations  Rolling walker with 5" wheels    Recommendations for Other Services       Precautions / Restrictions Precautions Precautions: Fall Restrictions Weight Bearing Restrictions: Yes RLE Weight Bearing: Weight bearing as tolerated LLE Weight Bearing: Weight bearing as tolerated      Mobility  Bed Mobility Overal bed mobility: (pt received and left in recliner)                Transfers Overall transfer level: Needs  assistance Equipment used: Rolling walker (2 wheeled) Transfers: Sit to/from Stand Sit to Stand: Mod assist            Ambulation/Gait Ambulation/Gait assistance: Min guard Gait Distance (Feet): 25 Feet(8' on initial trial) Assistive device: Rolling walker (2 wheeled) Gait Pattern/deviations: Step-to pattern Gait velocity: reduced Gait velocity interpretation: <1.8 ft/sec, indicate of risk for recurrent falls General Gait Details: short step to pattern, one instance of R knee buckling during first trial. Pt with reduced TKE during stance phase bilaterally  Stairs            Wheelchair Mobility    Modified Rankin (Stroke Patients Only)       Balance Overall balance assessment: Needs assistance Sitting-balance support: No upper extremity supported;Feet supported Sitting balance-Leahy Scale: Good     Standing balance support: Bilateral upper extremity supported Standing balance-Leahy Scale: Fair Standing balance comment: minG with BUE support of RW                             Pertinent Vitals/Pain Pain Assessment: Faces Faces Pain Scale: Hurts whole lot Pain Location: BLE Pain Descriptors / Indicators: Aching Pain Intervention(s): Limited activity within patient's tolerance    Home Living Family/patient expects to be discharged to:: Private residence Living Arrangements: Spouse/significant other Available Help at Discharge: Friend(s) Type of Home: Apartment Home Access: Stairs to enter Entrance Stairs-Rails: Can reach both Entrance Stairs-Number of Steps: 3 Home Layout: One level Home Equipment: Cane - single point      Prior Function Level of Independence: Independent with assistive device(s)  Comments: independent with use of cane     Hand Dominance   Dominant Hand: Right    Extremity/Trunk Assessment   Upper Extremity Assessment Upper Extremity Assessment: Overall WFL for tasks assessed    Lower Extremity  Assessment Lower Extremity Assessment: LLE deficits/detail;RLE deficits/detail RLE Deficits / Details: Grossly 4-/5, knee flexion limited to 90 degrees, knee extension limited by ~5 degrees actively LLE Deficits / Details: Grossly 4-/5, knee flexion limited to 100 degrees, extension WFL    Cervical / Trunk Assessment Cervical / Trunk Assessment: Normal  Communication   Communication: No difficulties  Cognition Arousal/Alertness: Awake/alert Behavior During Therapy: WFL for tasks assessed/performed Overall Cognitive Status: Within Functional Limits for tasks assessed                                        General Comments General comments (skin integrity, edema, etc.): VSS on RA    Exercises     Assessment/Plan    PT Assessment Patient needs continued PT services  PT Problem List Decreased strength;Decreased range of motion;Decreased activity tolerance;Decreased balance;Decreased mobility;Decreased knowledge of use of DME;Decreased safety awareness;Pain       PT Treatment Interventions DME instruction;Gait training;Stair training;Functional mobility training;Therapeutic activities;Therapeutic exercise;Balance training;Neuromuscular re-education;Patient/family education    PT Goals (Current goals can be found in the Care Plan section)  Acute Rehab PT Goals Patient Stated Goal: To return to independent mobility PT Goal Formulation: With patient Time For Goal Achievement: 12/05/19 Potential to Achieve Goals: Good    Frequency Min 5X/week   Barriers to discharge        Co-evaluation               AM-PAC PT "6 Clicks" Mobility  Outcome Measure Help needed turning from your back to your side while in a flat bed without using bedrails?: A Little Help needed moving from lying on your back to sitting on the side of a flat bed without using bedrails?: A Little Help needed moving to and from a bed to a chair (including a wheelchair)?: A Lot Help needed  standing up from a chair using your arms (e.g., wheelchair or bedside chair)?: A Lot Help needed to walk in hospital room?: A Little Help needed climbing 3-5 steps with a railing? : A Lot 6 Click Score: 15    End of Session Equipment Utilized During Treatment: Gait belt Activity Tolerance: Patient limited by pain Patient left: in chair;with call bell/phone within reach Nurse Communication: Mobility status PT Visit Diagnosis: Other abnormalities of gait and mobility (R26.89)    Time: 6295-2841 PT Time Calculation (min) (ACUTE ONLY): 25 min   Charges:   PT Evaluation $PT Eval Moderate Complexity: 1 Mod PT Treatments $Gait Training: 8-22 mins        Arlyss Gandy, PT, DPT Acute Rehabilitation Pager: [phone]   Arlyss Gandy 11/21/2019, 11:28 AM

## 2019-11-22 LAB — C-REACTIVE PROTEIN: CRP: 14.2 mg/dL — ABNORMAL HIGH (ref <1.0)

## 2019-11-22 LAB — CBC WITH DIFFERENTIAL/PLATELET
Abs Immature Granulocytes: 0.02 10*3/uL (ref 0.00–0.07)
Basophils Absolute: 0 10*3/uL (ref 0.0–0.1)
Basophils Relative: 0 %
Eosinophils Absolute: 0.1 10*3/uL (ref 0.0–0.5)
Eosinophils Relative: 1 %
HCT: 33.7 % — ABNORMAL LOW (ref 39.0–52.0)
Hemoglobin: 11 g/dL — ABNORMAL LOW (ref 13.0–17.0)
Immature Granulocytes: 0 %
Lymphocytes Relative: 18 %
Lymphs Abs: 1.5 10*3/uL (ref 0.7–4.0)
MCH: 30.6 pg (ref 26.0–34.0)
MCHC: 32.6 g/dL (ref 30.0–36.0)
MCV: 93.6 fL (ref 80.0–100.0)
Monocytes Absolute: 0.8 10*3/uL (ref 0.1–1.0)
Monocytes Relative: 10 %
Neutro Abs: 5.6 10*3/uL (ref 1.7–7.7)
Neutrophils Relative %: 71 %
Platelets: 379 10*3/uL (ref 150–400)
RBC: 3.6 MIL/uL — ABNORMAL LOW (ref 4.22–5.81)
RDW: 15.9 % — ABNORMAL HIGH (ref 11.5–15.5)
WBC: 8.1 10*3/uL (ref 4.0–10.5)
nRBC: 0.2 % (ref 0.0–0.2)

## 2019-11-22 LAB — BODY FLUID CULTURE: Culture: NO GROWTH

## 2019-11-22 LAB — BASIC METABOLIC PANEL
Anion gap: 10 (ref 5–15)
BUN: 20 mg/dL (ref 6–20)
CO2: 24 mmol/L (ref 22–32)
Calcium: 9.2 mg/dL (ref 8.9–10.3)
Chloride: 105 mmol/L (ref 98–111)
Creatinine, Ser: 1.01 mg/dL (ref 0.61–1.24)
GFR calc Af Amer: >60 mL/min (ref >60)
GFR calc non Af Amer: >60 mL/min (ref >60)
Glucose, Bld: 135 mg/dL — ABNORMAL HIGH (ref 70–99)
Potassium: 4.2 mmol/L (ref 3.5–5.1)
Sodium: 139 mmol/L (ref 135–145)

## 2019-11-22 LAB — MAGNESIUM: Magnesium: 1.8 mg/dL (ref 1.7–2.4)

## 2019-11-22 LAB — GLUCOSE, CAPILLARY
Glucose-Capillary: 112 mg/dL — ABNORMAL HIGH (ref 70–99)
Glucose-Capillary: 151 mg/dL — ABNORMAL HIGH (ref 70–99)
Glucose-Capillary: 168 mg/dL — ABNORMAL HIGH (ref 70–99)
Glucose-Capillary: 151 mg/dL — ABNORMAL HIGH (ref 70–99)

## 2019-11-22 MED ORDER — LOSARTAN POTASSIUM 50 MG PO TABS
100.0000 mg | ORAL_TABLET | Freq: Every day | ORAL | Status: DC
  Administered 2019-11-22 – 2019-11-23 (×2): 100 mg via ORAL
  Filled 2019-11-22 (×2): qty 2

## 2019-11-22 NOTE — Plan of Care (Signed)

## 2019-11-22 NOTE — Progress Notes (Signed)
Patient ID: Darryl Hurley, male   DOB: July 05, 1961, 58 y.o.   MRN: [id number]  PROGRESS NOTE    Darryl Hurley  WUJ:[id number] DOB: Jun 04, 1961 DOA: 11/18/2019 PCP: Patient, No Pcp Per   Brief Narrative:  58 year old male with history of hypertension, COPD, chronic hypoxic respiratory failure on 2 L oxygen at home, diabetes mellitus type 2, gout, chronic low back pain and alcohol abuse presented with bilateral knee swelling and pain.  He underwent joint aspiration by ED of the right knee which showed turbid fluid with 431,000 WBCs with 93% neutrophils.  Orthopedics was consulted.  Patient was started on antibiotics.  He underwent I&D of bilateral septic knee joints by orthopedics on 11/19/2019.  Assessment & Plan:   Sepsis: Present on admission Bilateral knee septic arthritis Leukocytosis: Improved -Presented with bilateral knee pain.  Underwent joint aspiration by ED of the right knee which showed turbid fluid with 431,000 WBCs with 93% neutrophils.  Orthopedics was consulted.  He underwent I&D of bilateral septic knee joints by orthopedics on 11/19/2019. He was started on Ancef.  Subsequently antibiotics were switched to Rocephin and vancomycin on 11/20/2019. follow further orthopedics recommendations.  Cultures negative so far. -Pain management as per orthopedics -Doubt that this is acute gout flare.  Discontinued prednisone. -CRP improving to 14.2 today.  Leukocytosis -From above.  Monitor.  Chest pain -Questionable cause.  Currently chest pain-free.  EKG without significant ischemic changes.  No significant troponin elevation.  COPD, without acute exacerbation Chronic hypoxic respiratory failure on oxygen 2 L via nasal cannula at home -Stable.  Continue inhalers  Acute kidney injury on chronic kidney disease stage III -Probably from sepsis.  DC'd hydrochlorothiazide/losartan and lisinopril. -Creatinine 1.01 today.  DC IV fluids  Hypertension-monitor blood pressure.  Continue  amlodipine and clonidine.  Diabetes mellitus type 2 uncontrolled with hyperglycemia -A1c 6.9.  Hold Metformin.  CBGs with SSI.  Hyperlipidemia -Continue Crestor  Alcohol abuse -Reports drinking multiple 40 ounce beers per day plus a pint of liquor -Continue CIWA protocol.  Continue thiamine, multivitamin and folate.  Tobacco abuse- will need counseling regarding cessation  Generalized deconditioning-PT recommends home health PT   DVT prophylaxis: Lovenox Code Status: Full Family Communication: None at bedside  disposition Plan: Discharge home in 1 to 2 days if cleared by orthopedics  Consultants: Orthopedics  Procedures:  Bilateral knee I&D on 11/19/2019  Antimicrobials:  Anti-infectives (From admission, onward)   Start     Dose/Rate Route Frequency Ordered Stop   11/21/19 1200  vancomycin (VANCOCIN) IVPB 1000 mg/200 mL premix     1,000 mg 200 mL/hr over 60 Minutes Intravenous Every 24 hours 11/20/19 1221     11/20/19 1230  vancomycin (VANCOREADY) IVPB 1500 mg/300 mL     1,500 mg 150 mL/hr over 120 Minutes Intravenous  Once 11/20/19 1221 11/20/19 1628   11/20/19 1200  cefTRIAXone (ROCEPHIN) 2 g in sodium chloride 0.9 % 100 mL IVPB     2 g 200 mL/hr over 30 Minutes Intravenous Every 24 hours 11/20/19 1104     11/19/19 1400  ceFAZolin (ANCEF) IVPB 2g/100 mL premix     2 g 200 mL/hr over 30 Minutes Intravenous Every 8 hours 11/19/19 1337 11/20/19 0600   11/19/19 0815  ceFAZolin (ANCEF) IVPB 2g/100 mL premix  Status:  Discontinued     2 g 200 mL/hr over 30 Minutes Intravenous To Short Stay 11/19/19 0802 11/19/19 0840       Subjective: Patient seen and examined at bedside.  Denies  worsening knee pain.  No overnight fever or vomiting.  He is asking about when can he go home. Objective: Vitals:   11/21/19 2000 11/21/19 2300 11/22/19 0219 11/22/19 0400  BP:  (!) 142/94 (!) 144/98   Pulse:  73 79   Resp: (!) 25 20 (!) 22 17  Temp:  98.3 F (36.8 C) 97.7 F (36.5 C)     TempSrc:  Oral Oral   SpO2:  98% 99%   Weight:      Height:        Intake/Output Summary (Last 24 hours) at 11/22/2019 0730 Last data filed at 11/22/2019 0326 Gross per 24 hour  Intake 960 ml  Output 2500 ml  Net -1540 ml   Filed Weights   11/19/19 0313  Weight: 81.6 kg    Examination:  General exam: No distress.  Poor historian.   Respiratory system: Bilateral decreased breath sounds at bases with some scattered crackles Cardiovascular system: S1-S2 heard, rate controlled Gastrointestinal system: Abdomen is nondistended, soft and nontender. Normal bowel sounds heard. Extremities: No cyanosis, edema.  Bilateral knee dressing with drain present  Data Reviewed: I have personally reviewed following labs and imaging studies  CBC: Recent Labs  Lab 11/19/19 0321 11/20/19 0248 11/21/19 0652 11/22/19 0209  WBC 11.9* 9.4 13.0* 8.1  NEUTROABS 8.9*  --  10.5* 5.6  HGB 14.9 11.7* 10.8* 11.0*  HCT 44.6 35.7* 33.3* 33.7*  MCV 92.9 93.0 93.8 93.6  PLT 334 314 327 379   Basic Metabolic Panel: Recent Labs  Lab 11/19/19 0321 11/19/19 1431 11/20/19 0248 11/21/19 0652 11/22/19 0209  NA 132* 135 134* 138 139  K 3.9 3.8 3.7 4.2 4.2  CL 98 99 98 105 105  CO2 20* 23 24 22 24   GLUCOSE 170* 166* 156* 152* 135*  BUN 15 19 20  26* 20  CREATININE 1.40* 1.94* 1.98* 1.37* 1.01  CALCIUM 9.4 9.0 8.5* 9.0 9.2  MG  --   --  1.8 2.1 1.8  PHOS  --   --  4.7*  --   --    GFR: Estimated Creatinine Clearance: 79.3 mL/min (by C-G formula based on SCr of 1.01 mg/dL). Liver Function Tests: Recent Labs  Lab 11/19/19 1431  AST 18  ALT 14  ALKPHOS 61  BILITOT 1.3*  PROT 7.0  ALBUMIN 2.7*   No results for input(s): LIPASE, AMYLASE in the last 168 hours. No results for input(s): AMMONIA in the last 168 hours. Coagulation Profile: No results for input(s): INR, PROTIME in the last 168 hours. Cardiac Enzymes: No results for input(s): CKTOTAL, CKMB, CKMBINDEX, TROPONINI in the last 168  hours. BNP (last 3 results) No results for input(s): PROBNP in the last 8760 hours. HbA1C: Recent Labs    11/20/19 0248  HGBA1C 6.9*   CBG: Recent Labs  Lab 11/21/19 0627 11/21/19 1110 11/21/19 1548 11/21/19 2124 11/22/19 0655  GLUCAP 150* 131* 156* 145* 112*   Lipid Profile: No results for input(s): CHOL, HDL, LDLCALC, TRIG, CHOLHDL, LDLDIRECT in the last 72 hours. Thyroid Function Tests: No results for input(s): TSH, T4TOTAL, FREET4, T3FREE, THYROIDAB in the last 72 hours. Anemia Panel: No results for input(s): VITAMINB12, FOLATE, FERRITIN, TIBC, IRON, RETICCTPCT in the last 72 hours. Sepsis Labs: Recent Labs  Lab 11/19/19 11/24/19 11/19/19 1431  LATICACIDVEN 1.1 1.2    Recent Results (from the past 240 hour(s))  MRSA PCR Screening     Status: None   Collection Time: 11/19/19 12:37 AM   Specimen: Nasal Mucosa;  Nasopharyngeal  Result Value Ref Range Status   MRSA by PCR NEGATIVE NEGATIVE Final    Comment:        The GeneXpert MRSA Assay (FDA approved for NASAL specimens only), is one component of a comprehensive MRSA colonization surveillance program. It is not intended to diagnose MRSA infection nor to guide or monitor treatment for MRSA infections. Performed at [facility], [address], [place], [place] [address]   Body fluid culture     Status: None (Preliminary result)   Collection Time: 11/19/19  4:22 AM   Specimen: Body Fluid  Result Value Ref Range Status   Specimen Description FLUID SYNOVIAL KNEE  Final   Special Requests NONE  Final   Gram Stain   Final    ABUNDANT WBC PRESENT, PREDOMINANTLY PMN NO ORGANISMS SEEN    Culture   Final    NO GROWTH 2 DAYS Performed at [facility], [address] [place], [place] [address]    Report Status PENDING  Incomplete  Respiratory Panel by RT PCR (Flu A&B, Covid) - Nasopharyngeal Swab     Status: None   Collection Time: 11/19/19  7:04 AM   Specimen: Nasopharyngeal Swab  Result Value Ref  Range Status   SARS Coronavirus 2 by RT PCR NEGATIVE NEGATIVE Final    Comment: (NOTE) SARS-CoV-2 target nucleic acids are NOT DETECTED. The SARS-CoV-2 RNA is generally detectable in upper respiratoy specimens during the acute phase of infection. The lowest concentration of SARS-CoV-2 viral copies this assay can detect is 131 copies/mL. A negative result does not preclude SARS-Cov-2 infection and should not be used as the sole basis for treatment or other patient management decisions. A negative result may occur with  improper specimen collection/handling, submission of specimen other than nasopharyngeal swab, presence of viral mutation(s) within the areas targeted by this assay, and inadequate number of viral copies (<131 copies/mL). A negative result must be combined with clinical observations, patient history, and epidemiological information. The expected result is Negative. Fact Sheet for Patients:  https://www.moore.com/ Fact Sheet for Healthcare Providers:  https://www.young.biz/ This test is not yet ap proved or cleared by the [place] FDA and  has been authorized for detection and/or diagnosis of SARS-CoV-2 by FDA under an Emergency Use Authorization (EUA). This EUA will remain  in effect (meaning this test can be used) for the duration of the COVID-19 declaration under Section 564(b)(1) of the Act, 21 U.S.C. section 360bbb-3(b)(1), unless the authorization is terminated or revoked sooner.    Influenza A by PCR NEGATIVE NEGATIVE Final   Influenza B by PCR NEGATIVE NEGATIVE Final    Comment: (NOTE) The Xpert Xpress SARS-CoV-2/FLU/RSV assay is intended as an aid in  the diagnosis of influenza from Nasopharyngeal swab specimens and  should not be used as a sole basis for treatment. Nasal washings and  aspirates are unacceptable for Xpert Xpress SARS-CoV-2/FLU/RSV  testing. Fact Sheet for  Patients: https://www.moore.com/ Fact Sheet for Healthcare Providers: https://www.young.biz/ This test is not yet approved or cleared by the [place] FDA and  has been authorized for detection and/or diagnosis of SARS-CoV-2 by  FDA under an Emergency Use Authorization (EUA). This EUA will remain  in effect (meaning this test can be used) for the duration of the  Covid-19 declaration under Section 564(b)(1) of the Act, 21  U.S.C. section 360bbb-3(b)(1), unless the authorization is  terminated or revoked. Performed at [facility], [address], [place], [place] [address]  Culture, blood (routine x 2)     Status: None (Preliminary result)   Collection Time: 11/19/19  9:00 AM   Specimen: BLOOD  Result Value Ref Range Status   Specimen Description BLOOD SITE NOT SPECIFIED  Final   Special Requests   Final    BOTTLES DRAWN AEROBIC AND ANAEROBIC Blood Culture results may not be optimal due to an excessive volume of blood received in culture bottles   Culture   Final    NO GROWTH 2 DAYS Performed at [facility], [place] [address], [place], [place] [address]    Report Status PENDING  Incomplete  Culture, blood (routine x 2)     Status: None (Preliminary result)   Collection Time: 11/19/19  9:00 AM   Specimen: BLOOD  Result Value Ref Range Status   Specimen Description BLOOD SITE NOT SPECIFIED  Final   Special Requests   Final    BOTTLES DRAWN AEROBIC AND ANAEROBIC Blood Culture results may not be optimal due to an excessive volume of blood received in culture bottles   Culture   Final    NO GROWTH 2 DAYS Performed at [facility], [place] [address], [place], [place] [address]    Report Status PENDING  Incomplete  Aerobic/Anaerobic Culture (surgical/deep wound)     Status: None (Preliminary result)   Collection Time: 11/19/19 10:23 AM   Specimen: PATH Other; Tissue  Result Value Ref Range Status   Specimen Description WOUND  RIGHT KNEE  Final   Special Requests NONE  Final   Gram Stain   Final    FEW WBC PRESENT,BOTH PMN AND MONONUCLEAR NO ORGANISMS SEEN    Culture   Final    NO GROWTH 2 DAYS Performed at [facility], [place] [address] [place], [place] [address]    Report Status PENDING  Incomplete  Aerobic/Anaerobic Culture (surgical/deep wound)     Status: None (Preliminary result)   Collection Time: 11/19/19 10:25 AM   Specimen: PATH Other; Tissue  Result Value Ref Range Status   Specimen Description WOUND LEFT KNEE  Final   Special Requests NONE  Final   Gram Stain   Final    FEW WBC PRESENT,BOTH PMN AND MONONUCLEAR NO ORGANISMS SEEN    Culture   Final    NO GROWTH 2 DAYS Performed at [facility], [place] [address], [place], [place] [address]    Report Status PENDING  Incomplete         Radiology Studies: No results found.      Scheduled Meds: . acetaminophen  650 mg Oral Q6H  . amLODipine  10 mg Oral Daily  . arformoterol  15 mcg Nebulization BID  . aspirin EC  81 mg Oral Daily  . budesonide (PULMICORT) nebulizer solution  0.5 mg Nebulization BID  . cholecalciferol  2,000 Units Oral BID  . cloNIDine  0.1 mg Oral BID  . enoxaparin (LOVENOX) injection  40 mg Subcutaneous Q24H  . folic acid  1 mg Oral Daily  . gabapentin  300 mg Oral BID  . insulin aspart  0-9 Units Subcutaneous TID WC  . LORazepam  0-4 mg Intravenous Q12H  . multivitamin with minerals  1 tablet Oral Daily  . rosuvastatin  40 mg Oral Daily  . sodium chloride flush  3 mL Intravenous Q12H  . thiamine  100 mg Oral Daily   Continuous Infusions: . sodium chloride 75 mL/hr at 11/21/19 2000  . cefTRIAXone (ROCEPHIN)  IV  2 g (11/21/19 1111)  . vancomycin 1,000 mg (11/21/19 1148)          Glade LloydKshitiz Alexarae Oliva, MD [facility] 11/22/2019, 7:30 AM

## 2019-11-22 NOTE — Progress Notes (Addendum)
Pt is ambulating with minimal pain to the bathroom. Walking around the room, IVABX continue,  BL legs dressing clean, dry and intact, will continue to monitor the patient  Palma Holter, RN

## 2019-11-22 NOTE — Progress Notes (Signed)
Orthopaedic Progress Note  S: Patient doing well. He reports minimal pain in his bilateral knees. He is anxious to go home. He denies any fevers, chills, chest pain, shortness of breath, or nausea.   O:  Vitals:   11/20/19 0848 11/20/19 1052  BP:  111/82  Pulse:  84  Resp:  (!) 21  Temp:  98.4 F (36.9 C)  SpO2: 100% 96%    General -sitting up in bed, no acute distress.  Awake alert and oriented.  Pleasant and cooperative.  Respiratory - No increased work of breathing.   Right Lower Extremity -  Minimal tenderness with palpation of the knee. Hemovac with minimal drainage - removed. Incision sites CDI. Nontender in the lower leg.  Ankle dorsiflexion plantarflexion is intact.  Able to actively flex the knee a small amount.  Sensation is intact to light touch distally.  Neurovascularly intact.  Left Lower Extremity - Minimal tenderness with palpation of the knee. Hemovac with minimal drainage - removed. Incision sites CDI.Marland Kitchen  Nontender in the lower leg, thigh, hip.  Ankle dorsiflexion plantarflexion is intact.  Tolerates some flexion of the knee. Sensation is intact to light touch distally.  Neurovascularly intact.  Labs:  Results for orders placed or performed during the hospital encounter of 11/18/19 (from the past 24 hour(s))  Lactic acid, plasma     Status: None   Collection Time: 11/19/19  2:31 PM  Result Value Ref Range   Lactic Acid, Venous 1.2 0.5 - 1.9 mmol/L  Comprehensive metabolic panel     Status: Abnormal   Collection Time: 11/19/19  2:31 PM  Result Value Ref Range   Sodium 135 135 - 145 mmol/L   Potassium 3.8 3.5 - 5.1 mmol/L   Chloride 99 98 - 111 mmol/L   CO2 23 22 - 32 mmol/L   Glucose, Bld 166 (H) 70 - 99 mg/dL   BUN 19 6 - 20 mg/dL   Creatinine, Ser 1.22 (H) 0.61 - 1.24 mg/dL   Calcium 9.0 8.9 - 48.2 mg/dL   Total Protein 7.0 6.5 - 8.1 g/dL   Albumin 2.7 (L) 3.5 - 5.0 g/dL   AST 18 15 - 41 U/L   ALT 14 0 - 44 U/L   Alkaline Phosphatase 61 38 - 126 U/L    Total Bilirubin 1.3 (H) 0.3 - 1.2 mg/dL   GFR calc non Af Amer 37 (L) >60 mL/min   GFR calc Af Amer 43 (L) >60 mL/min   Anion gap 13 5 - 15  CBC     Status: Abnormal   Collection Time: 11/20/19  2:48 AM  Result Value Ref Range   WBC 9.4 4.0 - 10.5 K/uL   RBC 3.84 (L) 4.22 - 5.81 MIL/uL   Hemoglobin 11.7 (L) 13.0 - 17.0 g/dL   HCT 50.0 (L) 37.0 - 48.8 %   MCV 93.0 80.0 - 100.0 fL   MCH 30.5 26.0 - 34.0 pg   MCHC 32.8 30.0 - 36.0 g/dL   RDW 89.1 (H) 69.4 - 50.3 %   Platelets 314 150 - 400 K/uL   nRBC 0.0 0.0 - 0.2 %  Basic metabolic panel     Status: Abnormal   Collection Time: 11/20/19  2:48 AM  Result Value Ref Range   Sodium 134 (L) 135 - 145 mmol/L   Potassium 3.7 3.5 - 5.1 mmol/L   Chloride 98 98 - 111 mmol/L   CO2 24 22 - 32 mmol/L   Glucose, Bld 156 (H) 70 -  99 mg/dL   BUN 20 6 - 20 mg/dL   Creatinine, Ser 1.98 (H) 0.61 - 1.24 mg/dL   Calcium 8.5 (L) 8.9 - 10.3 mg/dL   GFR calc non Af Amer 36 (L) >60 mL/min   GFR calc Af Amer 42 (L) >60 mL/min   Anion gap 12 5 - 15  VITAMIN D 25 Hydroxy (Vit-D Deficiency, Fractures)     Status: Abnormal   Collection Time: 11/20/19  2:48 AM  Result Value Ref Range   Vit D, 25-Hydroxy 11.33 (L) 30 - 100 ng/mL  Magnesium     Status: None   Collection Time: 11/20/19  2:48 AM  Result Value Ref Range   Magnesium 1.8 1.7 - 2.4 mg/dL  Phosphorus     Status: Abnormal   Collection Time: 11/20/19  2:48 AM  Result Value Ref Range   Phosphorus 4.7 (H) 2.5 - 4.6 mg/dL  Hemoglobin A1c     Status: Abnormal   Collection Time: 11/20/19  2:48 AM  Result Value Ref Range   Hgb A1c MFr Bld 6.9 (H) 4.8 - 5.6 %   Mean Plasma Glucose 151.33 mg/dL  Glucose, capillary     Status: Abnormal   Collection Time: 11/20/19  6:21 AM  Result Value Ref Range   Glucose-Capillary 138 (H) 70 - 99 mg/dL   Comment 1 Notify RN    Comment 2 Document in Chart     Assessment: 58 year old male with bilateral septic knee arthritis s/p I&D 11/19/2019 by Dr.  Doreatha Martin  Weightbearing: WBAT BLE Insicional and dressing care: Dressings should be changed PRN. Hemovacs have been removed. New dressing placed on bilateral knees.   VTE prophylaxis: Lovenox while in the hospital, discharge on ASA 325 daily  ID: per primary team  Dispo: Recommend 5,000 IU Vitamin D3 supplementation daily at discharge. Will need ASA 325 for DVT prophylaxis at discharge.  Follow - up plan: 2 weeks for suture removal with Dr. Jolyne Loa, PA-C 11/22/2019

## 2019-11-23 LAB — BASIC METABOLIC PANEL
Anion gap: 13 (ref 5–15)
BUN: 18 mg/dL (ref 6–20)
CO2: 28 mmol/L (ref 22–32)
Calcium: 10 mg/dL (ref 8.9–10.3)
Chloride: 99 mmol/L (ref 98–111)
Creatinine, Ser: 1.26 mg/dL — ABNORMAL HIGH (ref 0.61–1.24)
GFR calc Af Amer: >60 mL/min (ref >60)
GFR calc non Af Amer: >60 mL/min (ref >60)
Glucose, Bld: 129 mg/dL — ABNORMAL HIGH (ref 70–99)
Potassium: 4.1 mmol/L (ref 3.5–5.1)
Sodium: 140 mmol/L (ref 135–145)

## 2019-11-23 LAB — CBC WITH DIFFERENTIAL/PLATELET
Abs Immature Granulocytes: 0.03 10*3/uL (ref 0.00–0.07)
Basophils Absolute: 0 10*3/uL (ref 0.0–0.1)
Basophils Relative: 1 %
Eosinophils Absolute: 0.1 10*3/uL (ref 0.0–0.5)
Eosinophils Relative: 1 %
HCT: 38.5 % — ABNORMAL LOW (ref 39.0–52.0)
Hemoglobin: 12.7 g/dL — ABNORMAL LOW (ref 13.0–17.0)
Immature Granulocytes: 0 %
Lymphocytes Relative: 21 %
Lymphs Abs: 1.5 10*3/uL (ref 0.7–4.0)
MCH: 30.3 pg (ref 26.0–34.0)
MCHC: 33 g/dL (ref 30.0–36.0)
MCV: 91.9 fL (ref 80.0–100.0)
Monocytes Absolute: 0.9 10*3/uL (ref 0.1–1.0)
Monocytes Relative: 12 %
Neutro Abs: 4.5 10*3/uL (ref 1.7–7.7)
Neutrophils Relative %: 65 %
Platelets: 466 10*3/uL — ABNORMAL HIGH (ref 150–400)
RBC: 4.19 MIL/uL — ABNORMAL LOW (ref 4.22–5.81)
RDW: 15.5 % (ref 11.5–15.5)
WBC: 7 10*3/uL (ref 4.0–10.5)
nRBC: 0.4 % — ABNORMAL HIGH (ref 0.0–0.2)

## 2019-11-23 LAB — MAGNESIUM: Magnesium: 1.8 mg/dL (ref 1.7–2.4)

## 2019-11-23 LAB — GLUCOSE, CAPILLARY
Glucose-Capillary: 126 mg/dL — ABNORMAL HIGH (ref 70–99)
Glucose-Capillary: 125 mg/dL — ABNORMAL HIGH (ref 70–99)

## 2019-11-23 LAB — C-REACTIVE PROTEIN: CRP: 11.4 mg/dL — ABNORMAL HIGH (ref <1.0)

## 2019-11-23 MED ORDER — CEPHALEXIN 500 MG PO CAPS: 500.0000 mg | ORAL_CAPSULE | Freq: Three times a day (TID) | ORAL | 0 refills | Status: AC

## 2019-11-23 MED ORDER — THIAMINE HCL 100 MG PO TABS: 100.0000 mg | ORAL_TABLET | Freq: Every day | ORAL | 0 refills | Status: DC

## 2019-11-23 MED ORDER — ADULT MULTIVITAMIN W/MINERALS CH: 1.0000 | ORAL_TABLET | Freq: Every day | ORAL | Status: DC

## 2019-11-23 MED ORDER — VITAMIN D-3 125 MCG (5000 UT) PO TABS: 5000.0000 [IU] | ORAL_TABLET | Freq: Every day | ORAL | 2 refills | Status: DC

## 2019-11-23 MED ORDER — FOLIC ACID 1 MG PO TABS: 1.0000 mg | ORAL_TABLET | Freq: Every day | ORAL | 0 refills | Status: DC

## 2019-11-23 MED ORDER — VANCOMYCIN HCL 1500 MG/300ML IV SOLN
1500.0000 mg | INTRAVENOUS | Status: DC
  Filled 2019-11-23: qty 300

## 2019-11-23 MED ORDER — ASPIRIN EC 325 MG PO TBEC: 325.0000 mg | DELAYED_RELEASE_TABLET | Freq: Every day | ORAL | 0 refills | Status: AC

## 2019-11-23 MED ORDER — HYDROCODONE-ACETAMINOPHEN 5-325 MG PO TABS: 1.0000 | ORAL_TABLET | Freq: Four times a day (QID) | ORAL | 0 refills | Status: DC | PRN

## 2019-11-23 NOTE — Discharge Summary (Signed)
Physician Discharge Summary  Darryl Hurley ZOX:[id number] DOB: [birth year] DOA: 11/18/2019  PCP: Patient, No Pcp Per  Admit date: 11/18/2019 Discharge date: 11/23/2019  Admitted From: Home Disposition: Home  Recommendations for Outpatient Follow-up:  1. Follow up with PCP in 1 week with repeat CBC/BMP 2. Outpatient follow-up with orthopedics.  Wound care as per orthopedics recommendations. 3. Follow up in ED if symptoms worsen or new appear   Home Health: PT Equipment/Devices: None  Discharge Condition: Stable CODE STATUS: Full Diet recommendation: Heart healthy/carb modified  Brief/Interim Summary: 58 year old male with history of hypertension, COPD, chronic hypoxic respiratory failure on 2 L oxygen at home, diabetes mellitus type 2, gout, chronic low back pain and alcohol abuse presented with bilateral knee swelling and pain.  He underwent joint aspiration by ED of the right knee which showed turbid fluid with 431,000 WBCs with 93% neutrophils.  Orthopedics was consulted.  Patient was started on antibiotics.  He underwent I&D of bilateral septic knee joints by orthopedics on 11/19/2019.  Postoperatively, his condition improved.  He was switched to vancomycin and Rocephin.  OR cultures did not grow any organism.  Orthopedics has remove the drains.  Orthopedics has cleared the patient for discharge.  He will be discharged on oral Keflex.  Discharge Diagnoses:   Sepsis: Present on admission Bilateral knee septic arthritis Leukocytosis: Improved -Presented with bilateral knee pain.  Underwent joint aspiration by ED of the right knee which showed turbid fluid with 431,000 WBCs with 93% neutrophils.  Orthopedics was consulted.  He underwent I&D of bilateral septic knee joints by orthopedics on 11/19/2019. He was started on Ancef.  Subsequently antibiotics were switched to Rocephin and vancomycin on 11/20/2019. Cultures negative so far. -Pain management as per orthopedics -Doubt that this  is acute gout flare.  Discontinued prednisone. -CRP improving to 11.4 today. -Orthopedics has removed the drains and cleared the patient for discharge with outpatient follow-up with orthopedics.  Pain management as per orthopedics.  He will be discharged on oral Keflex for 10 more days and orthopedics is aware about the same. -Sepsis has resolved.  Leukocytosis -From above.    Resolved  Chest pain -Questionable cause.  Currently chest pain-free.  EKG without significant ischemic changes.  No significant troponin elevation.  COPD, without acute exacerbation Chronic hypoxic respiratory failure on oxygen 2 L via nasal cannula at home -Stable.  Continue inhalers.  Outpatient follow-up  Acute kidney injury on chronic kidney disease stage III -Probably from sepsis.  DC'd hydrochlorothiazide/losartan. -Creatinine 1.26 today.    Treated with IV fluids which was subsequently discontinued.  We will also not resume Metformin on discharge. -Outpatient follow-up of BMP.  Hypertension-intermittently on the higher side.  Continue amlodipine, clonidine and lisinopril on discharge.  Hydrochlorothiazide and losartan will remain on hold till evaluation by PCP.  Diabetes mellitus type 2 uncontrolled with hyperglycemia -A1c 6.9.  Hold Metformin.    Carb modified diet.  Outpatient follow-up   hyperlipidemia -Continue Crestor  Alcohol abuse -Reports drinking multiple 40 ounce beers per day plus a pint of liquor -Treated with CIWA protocol.  Continue thiamine, multivitamin and folate on discharge. -Consult regarding alcohol abstinence.  Tobacco abuse-counseled regarding cessation  Generalized deconditioning-PT recommends home health PT   Discharge Instructions  Discharge Instructions    Diet - low sodium heart healthy   Complete by: As directed    Increase activity slowly   Complete by: As directed      Allergies as of 11/23/2019      Reactions  Nsaids Other (See Comments)    Vagale down reaction   Toradol [ketorolac Tromethamine]    vagale down reaction      Medication List    STOP taking these medications   hydrochlorothiazide 25 MG tablet Commonly known as: HYDRODIURIL   losartan 100 MG tablet Commonly known as: COZAAR   metFORMIN 1000 MG tablet Commonly known as: GLUCOPHAGE   predniSONE 10 MG tablet Commonly known as: DELTASONE     TAKE these medications   acetaminophen 325 MG tablet Commonly known as: Tylenol Take 2 tablets (650 mg total) by mouth every 6 (six) hours as needed.   albuterol 108 (90 Base) MCG/ACT inhaler Commonly known as: VENTOLIN HFA Inhale 1 puff into the lungs every 6 (six) hours as needed for wheezing or shortness of breath.   allopurinol 100 MG tablet Commonly known as: ZYLOPRIM Take 100 mg by mouth daily.   amLODipine 10 MG tablet Commonly known as: NORVASC Take 10 mg by mouth daily.   aspirin EC 325 MG tablet Take 1 tablet (325 mg total) by mouth daily. What changed:   medication strength  how much to take   cephALEXin 500 MG capsule Commonly known as: KEFLEX Take 1 capsule (500 mg total) by mouth 3 (three) times daily for 10 days.   cloNIDine 0.1 MG tablet Commonly known as: CATAPRES Take 0.1 mg by mouth 2 (two) times daily.   Flovent HFA 110 MCG/ACT inhaler Generic drug: fluticasone Inhale 1 puff into the lungs 2 (two) times daily as needed (shortness of breath).   fluticasone 50 MCG/ACT nasal spray Commonly known as: FLONASE Place 1 spray into both nostrils daily.   folic acid 1 MG tablet Commonly known as: FOLVITE Take 1 tablet (1 mg total) by mouth daily.   gabapentin 300 MG capsule Commonly known as: NEURONTIN Take 300 mg by mouth 2 (two) times daily.   HYDROcodone-acetaminophen 5-325 MG tablet Commonly known as: NORCO/VICODIN Take 1 tablet by mouth every 6 (six) hours as needed for moderate pain.   lisinopril 20 MG tablet Commonly known as: ZESTRIL Take 20 mg by mouth daily.    meloxicam 7.5 MG tablet Commonly known as: MOBIC Take 7.5 mg by mouth daily.   multivitamin with minerals Tabs tablet Take 1 tablet by mouth daily.   nitroGLYCERIN 0.4 MG SL tablet Commonly known as: NITROSTAT Place 1 tablet (0.4 mg total) under the tongue every 5 (five) minutes x 3 doses as needed for chest pain.   rosuvastatin 40 MG tablet Commonly known as: CRESTOR Take 40 mg by mouth daily.   Symbicort 160-4.5 MCG/ACT inhaler Generic drug: budesonide-formoterol Inhale 2 puffs into the lungs 2 (two) times daily.   thiamine 100 MG tablet Take 1 tablet (100 mg total) by mouth daily.   tiZANidine 4 MG capsule Commonly known as: ZANAFLEX Take 4 mg by mouth as needed for muscle spasms.   traZODone 50 MG tablet Commonly known as: DESYREL Take 1 tablet (50 mg total) by mouth at bedtime as needed for sleep.   Vitamin D-3 125 MCG (5000 UT) Tabs Take 5,000 Units by mouth daily.            Durable Medical Equipment  (From admission, onward)         Start     Ordered   11/23/19 0934  For home use only DME 4 wheeled rolling walker with seat  Once    Question:  Patient needs a walker to treat with the following condition  Answer:  Weakness   11/23/19 0933         Follow-up Information    Haddix, Gillie Manners, MD. Schedule an appointment as soon as possible for a visit in 2 weeks.   Specialty: Orthopedic Surgery Why: For suture removal Contact information: [address] [place] [address] [phone]        PCP. Schedule an appointment as soon as possible for a visit in 1 week(s).   Why: with repeat CBC/BMP       [facility] Follow up.   Why: rollator       [facility] Follow up.   Specialty: Family Medicine Contact information: [address] [place] [address] [phone]          Allergies  Allergen Reactions  . Nsaids Other (See Comments)    Vagale down reaction  . Toradol [Ketorolac Tromethamine]      vagale down reaction    Consultations:  Orthopedics   Procedures/Studies: DG Knee 1-2 Views Left  Result Date: 11/19/2019 CLINICAL DATA:  Pain and swelling EXAM: LEFT KNEE - 1-2 VIEW COMPARISON:  December 25, 2018 FINDINGS: Frontal and lateral views were obtained. No fracture or dislocation. There is a large joint effusion. There is moderate generalized joint space narrowing with slight intra-articular calcification medially. There is spurring in all compartments. No erosive change. There are foci of calcification in the superficial femoral and popliteal arteries. IMPRESSION: 1.  Large joint effusion.  No fracture or dislocation. 2.  Generalized osteoarthritic change, similar to prior study. 3.  Multiple foci of arterial vascular calcification. Electronically Signed   By: Bretta Bang III M.D.   On: 11/19/2019 07:27   DG Knee 1-2 Views Right  Result Date: 11/19/2019 CLINICAL DATA:  Pain and swelling EXAM: RIGHT KNEE - 1-2 VIEW COMPARISON:  January 20, 2013 FINDINGS: Frontal and lateral views were obtained. No fracture or dislocation. There is a large joint effusion. There is relatively mild narrowing medially and in the patellofemoral joint region. There is mild spurring in all compartments. There is prepatellar soft tissue swelling with calcification in this area inferiorly, a finding also present previously. There are foci of arterial vascular calcification in the superficial femoral artery and popliteal artery regions. IMPRESSION: 1.  Large joint effusion.  No acute fracture or dislocation. 2. Prepatellar soft tissue swelling with calcification inferiorly in this area. Question residua of prior hematoma versus prepatellar inflammatory lesion. Clinical assessment of this area advised. 3. Osteoarthritic change, primarily medially and in the patellofemoral joint. 4.  Foci of arterial vascular calcification. Electronically Signed   By: Bretta Bang III M.D.   On: 11/19/2019 07:25   DG  Chest Port 1 View  Result Date: 11/19/2019 CLINICAL DATA:  Chest pain EXAM: PORTABLE CHEST 1 VIEW COMPARISON:  10/02/2019 FINDINGS: Normal heart size and stable aortic contours. Stable low volume chest. There is no edema, consolidation, effusion, or pneumothorax. IMPRESSION: No evidence of active disease. Electronically Signed   By: Marnee Spring M.D.   On: 11/19/2019 04:40       Subjective: Patient seen and examined recently he feels much better and wants to go home with no overnight fever, nausea, vomiting or worsening pain.  Discharge Exam: Vitals:   11/23/19 0755 11/23/19 0838  BP: (!) 159/100   Pulse: 77   Resp: 19   Temp: 97.9 F (36.6 C)   SpO2: 100% 99%    General: Pt is alert, awake, not in acute distress.  Poor historian  Cardiovascular: rate controlled, S1/S2 + Respiratory: bilateral decreased breath sounds at bases Abdominal: Soft, NT, ND, bowel sounds + Extremities: no edema, no cyanosis.  Bilateral knees are wrapped.    The results of significant diagnostics from this hospitalization (including imaging, microbiology, ancillary and laboratory) are listed below for reference.     Microbiology: Recent Results (from the past 240 hour(s))  MRSA PCR Screening     Status: None   Collection Time: 11/19/19 12:37 AM   Specimen: Nasal Mucosa; Nasopharyngeal  Result Value Ref Range Status   MRSA by PCR NEGATIVE NEGATIVE Final    Comment:        The GeneXpert MRSA Assay (FDA approved for NASAL specimens only), is one component of a comprehensive MRSA colonization surveillance program. It is not intended to diagnose MRSA infection nor to guide or monitor treatment for MRSA infections. Performed at [facility], [address], [place], [place] [address]   Body fluid culture     Status: None   Collection Time: 11/19/19  4:22 AM   Specimen: Body Fluid  Result Value Ref Range Status   Specimen Description FLUID SYNOVIAL KNEE  Final   Special Requests NONE   Final   Gram Stain   Final    ABUNDANT WBC PRESENT, PREDOMINANTLY PMN NO ORGANISMS SEEN    Culture   Final    NO GROWTH 3 DAYS Performed at [facility], [address] [place], [place] [address]    Report Status 11/22/2019 FINAL  Final  Respiratory Panel by RT PCR (Flu A&B, Covid) - Nasopharyngeal Swab     Status: None   Collection Time: 11/19/19  7:04 AM   Specimen: Nasopharyngeal Swab  Result Value Ref Range Status   SARS Coronavirus 2 by RT PCR NEGATIVE NEGATIVE Final    Comment: (NOTE) SARS-CoV-2 target nucleic acids are NOT DETECTED. The SARS-CoV-2 RNA is generally detectable in upper respiratoy specimens during the acute phase of infection. The lowest concentration of SARS-CoV-2 viral copies this assay can detect is 131 copies/mL. A negative result does not preclude SARS-Cov-2 infection and should not be used as the sole basis for treatment or other patient management decisions. A negative result may occur with  improper specimen collection/handling, submission of specimen other than nasopharyngeal swab, presence of viral mutation(s) within the areas targeted by this assay, and inadequate number of viral copies (<131 copies/mL). A negative result must be combined with clinical observations, patient history, and epidemiological information. The expected result is Negative. Fact Sheet for Patients:  PinkCheek.be Fact Sheet for Healthcare Providers:  GravelBags.it This test is not yet ap proved or cleared by the [place] FDA and  has been authorized for detection and/or diagnosis of SARS-CoV-2 by FDA under an Emergency Use Authorization (EUA). This EUA will remain  in effect (meaning this test can be used) for the duration of the COVID-19 declaration under Section 564(b)(1) of the Act, 21 U.S.C. section 360bbb-3(b)(1), unless the authorization is terminated or revoked sooner.    Influenza A by PCR  NEGATIVE NEGATIVE Final   Influenza B by PCR NEGATIVE NEGATIVE Final    Comment: (NOTE) The Xpert Xpress SARS-CoV-2/FLU/RSV assay is intended as an aid in  the diagnosis of influenza from Nasopharyngeal swab specimens and  should not be used as a sole basis for treatment. Nasal washings and  aspirates are unacceptable for Xpert Xpress SARS-CoV-2/FLU/RSV  testing. Fact Sheet for Patients: PinkCheek.be Fact Sheet for Healthcare Providers: GravelBags.it This test is not  yet approved or cleared by the [place] and  has been authorized for detection and/or diagnosis of SARS-CoV-2 by  FDA under an Emergency Use Authorization (EUA). This EUA will remain  in effect (meaning this test can be used) for the duration of the  Covid-19 declaration under Section 564(b)(1) of the Act, 21  U.S.C. section 360bbb-3(b)(1), unless the authorization is  terminated or revoked. Performed at [facility], [address], [place], [place] [address]   Culture, blood (routine x 2)     Status: None (Preliminary result)   Collection Time: 11/19/19  9:00 AM   Specimen: BLOOD  Result Value Ref Range Status   Specimen Description BLOOD SITE NOT SPECIFIED  Final   Special Requests   Final    BOTTLES DRAWN AEROBIC AND ANAEROBIC Blood Culture results may not be optimal due to an excessive volume of blood received in culture bottles   Culture   Final    NO GROWTH 4 DAYS Performed at [facility], [address] [place], [place] [address]    Report Status PENDING  Incomplete  Culture, blood (routine x 2)     Status: None (Preliminary result)   Collection Time: 11/19/19  9:00 AM   Specimen: BLOOD  Result Value Ref Range Status   Specimen Description BLOOD SITE NOT SPECIFIED  Final   Special Requests   Final    BOTTLES DRAWN AEROBIC AND ANAEROBIC Blood Culture results may not be optimal due to an excessive volume of blood received in  culture bottles   Culture   Final    NO GROWTH 4 DAYS Performed at [facility], [address], [place], [place] [address]    Report Status PENDING  Incomplete  Aerobic/Anaerobic Culture (surgical/deep wound)     Status: None (Preliminary result)   Collection Time: 11/19/19 10:23 AM   Specimen: PATH Other; Tissue  Result Value Ref Range Status   Specimen Description WOUND RIGHT KNEE  Final   Special Requests NONE  Final   Gram Stain   Final    FEW WBC PRESENT,BOTH PMN AND MONONUCLEAR NO ORGANISMS SEEN    Culture   Final    NO GROWTH 3 DAYS Performed at [facility], [address], [place], [place] [address]    Report Status PENDING  Incomplete  Aerobic/Anaerobic Culture (surgical/deep wound)     Status: None (Preliminary result)   Collection Time: 11/19/19 10:25 AM   Specimen: PATH Other; Tissue  Result Value Ref Range Status   Specimen Description WOUND LEFT KNEE  Final   Special Requests NONE  Final   Gram Stain   Final    FEW WBC PRESENT,BOTH PMN AND MONONUCLEAR NO ORGANISMS SEEN    Culture   Final    NO GROWTH 3 DAYS Performed at [facility], [address], [place], [place] [address]    Report Status PENDING  Incomplete     Labs: BNP (last 3 results) Recent Labs    11/24/18 1448  BNP 29.6   Basic Metabolic Panel: Recent Labs  Lab 11/19/19 1431 11/20/19 0248 11/21/19 0652 11/22/19 0209 11/23/19 0216  NA 135 134* 138 139 140  K 3.8 3.7 4.2 4.2 4.1  CL 99 98 105 105 99  CO2 23 24 22 24 28   GLUCOSE 166* 156* 152* 135* 129*  BUN 19 20 26* 20 18  CREATININE 1.94* 1.98* 1.37* 1.01 1.26*  CALCIUM 9.0 8.5* 9.0 9.2 10.0  MG  --  1.8 2.1 1.8 1.8  PHOS  --  4.7*  --   --   --    Liver Function Tests: Recent Labs  Lab 11/19/19 1431  AST 18  ALT 14  ALKPHOS 61  BILITOT 1.3*  PROT 7.0  ALBUMIN 2.7*   No results for input(s): LIPASE, AMYLASE in the last 168 hours. No results for input(s): AMMONIA in the last 168  hours. CBC: Recent Labs  Lab 11/19/19 0321 11/20/19 0248 11/21/19 0652 11/22/19 0209 11/23/19 0216  WBC 11.9* 9.4 13.0* 8.1 7.0  NEUTROABS 8.9*  --  10.5* 5.6 4.5  HGB 14.9 11.7* 10.8* 11.0* 12.7*  HCT 44.6 35.7* 33.3* 33.7* 38.5*  MCV 92.9 93.0 93.8 93.6 91.9  PLT 334 314 327 379 466*   Cardiac Enzymes: No results for input(s): CKTOTAL, CKMB, CKMBINDEX, TROPONINI in the last 168 hours. BNP: Invalid input(s): POCBNP CBG: Recent Labs  Lab 11/22/19 0655 11/22/19 1143 11/22/19 1618 11/22/19 2138 11/23/19 0644  GLUCAP 112* 151* 168* 151* 126*   D-Dimer No results for input(s): DDIMER in the last 72 hours. Hgb A1c No results for input(s): HGBA1C in the last 72 hours. Lipid Profile No results for input(s): CHOL, HDL, LDLCALC, TRIG, CHOLHDL, LDLDIRECT in the last 72 hours. Thyroid function studies No results for input(s): TSH, T4TOTAL, T3FREE, THYROIDAB in the last 72 hours.  Invalid input(s): FREET3 Anemia work up No results for input(s): VITAMINB12, FOLATE, FERRITIN, TIBC, IRON, RETICCTPCT in the last 72 hours. Urinalysis    Component Value Date/Time   COLORURINE STRAW (A) 11/27/2018 2349   APPEARANCEUR CLEAR 11/27/2018 2349   LABSPEC 1.006 11/27/2018 2349   PHURINE 5.0 11/27/2018 2349   GLUCOSEU NEGATIVE 11/27/2018 2349   HGBUR SMALL (A) 11/27/2018 2349   BILIRUBINUR NEGATIVE 11/27/2018 2349   KETONESUR NEGATIVE 11/27/2018 2349   PROTEINUR NEGATIVE 11/27/2018 2349   UROBILINOGEN 0.2 02/02/2014 2030   NITRITE NEGATIVE 11/27/2018 2349   LEUKOCYTESUR NEGATIVE 11/27/2018 2349   Sepsis Labs Invalid input(s): PROCALCITONIN,  WBC,  LACTICIDVEN Microbiology Recent Results (from the past 240 hour(s))  MRSA PCR Screening     Status: None   Collection Time: 11/19/19 12:37 AM   Specimen: Nasal Mucosa; Nasopharyngeal  Result Value Ref Range Status   MRSA by PCR NEGATIVE NEGATIVE Final    Comment:        The GeneXpert MRSA Assay (FDA approved for NASAL  specimens only), is one component of a comprehensive MRSA colonization surveillance program. It is not intended to diagnose MRSA infection nor to guide or monitor treatment for MRSA infections. Performed at [facility], [address] [place], [place] [address]   Body fluid culture     Status: None   Collection Time: 11/19/19  4:22 AM   Specimen: Body Fluid  Result Value Ref Range Status   Specimen Description FLUID SYNOVIAL KNEE  Final   Special Requests NONE  Final   Gram Stain   Final    ABUNDANT WBC PRESENT, PREDOMINANTLY PMN NO ORGANISMS SEEN    Culture   Final    NO GROWTH 3 DAYS Performed at [facility], [address], [place], [place] [address]    Report Status 11/22/2019 FINAL  Final  Respiratory Panel by RT PCR (Flu A&B, Covid) - Nasopharyngeal Swab     Status: None   Collection Time: 11/19/19  7:04 AM   Specimen: Nasopharyngeal Swab  Result Value Ref Range Status   SARS Coronavirus 2 by  RT PCR NEGATIVE NEGATIVE Final    Comment: (NOTE) SARS-CoV-2 target nucleic acids are NOT DETECTED. The SARS-CoV-2 RNA is generally detectable in upper respiratoy specimens during the acute phase of infection. The lowest concentration of SARS-CoV-2 viral copies this assay can detect is 131 copies/mL. A negative result does not preclude SARS-Cov-2 infection and should not be used as the sole basis for treatment or other patient management decisions. A negative result may occur with  improper specimen collection/handling, submission of specimen other than nasopharyngeal swab, presence of viral mutation(s) within the areas targeted by this assay, and inadequate number of viral copies (<131 copies/mL). A negative result must be combined with clinical observations, patient history, and epidemiological information. The expected result is Negative. Fact Sheet for Patients:  https://www.moore.com/https://www.fda.gov/media/142436/download Fact Sheet for Healthcare Providers:   https://www.young.biz/https://www.fda.gov/media/142435/download This test is not yet ap proved or cleared by the [place] FDA and  has been authorized for detection and/or diagnosis of SARS-CoV-2 by FDA under an Emergency Use Authorization (EUA). This EUA will remain  in effect (meaning this test can be used) for the duration of the COVID-19 declaration under Section 564(b)(1) of the Act, 21 U.S.C. section 360bbb-3(b)(1), unless the authorization is terminated or revoked sooner.    Influenza A by PCR NEGATIVE NEGATIVE Final   Influenza B by PCR NEGATIVE NEGATIVE Final    Comment: (NOTE) The Xpert Xpress SARS-CoV-2/FLU/RSV assay is intended as an aid in  the diagnosis of influenza from Nasopharyngeal swab specimens and  should not be used as a sole basis for treatment. Nasal washings and  aspirates are unacceptable for Xpert Xpress SARS-CoV-2/FLU/RSV  testing. Fact Sheet for Patients: https://www.moore.com/https://www.fda.gov/media/142436/download Fact Sheet for Healthcare Providers: https://www.young.biz/https://www.fda.gov/media/142435/download This test is not yet approved or cleared by the [place] FDA and  has been authorized for detection and/or diagnosis of SARS-CoV-2 by  FDA under an Emergency Use Authorization (EUA). This EUA will remain  in effect (meaning this test can be used) for the duration of the  Covid-19 declaration under Section 564(b)(1) of the Act, 21  U.S.C. section 360bbb-3(b)(1), unless the authorization is  terminated or revoked. Performed at [facility], [address] [place], [place] [address]   Culture, blood (routine x 2)     Status: None (Preliminary result)   Collection Time: 11/19/19  9:00 AM   Specimen: BLOOD  Result Value Ref Range Status   Specimen Description BLOOD SITE NOT SPECIFIED  Final   Special Requests   Final    BOTTLES DRAWN AEROBIC AND ANAEROBIC Blood Culture results may not be optimal due to an excessive volume of blood received in culture bottles   Culture   Final    NO  GROWTH 4 DAYS Performed at [facility], [address] [place], [place] [address]    Report Status PENDING  Incomplete  Culture, blood (routine x 2)     Status: None (Preliminary result)   Collection Time: 11/19/19  9:00 AM   Specimen: BLOOD  Result Value Ref Range Status   Specimen Description BLOOD SITE NOT SPECIFIED  Final   Special Requests   Final    BOTTLES DRAWN AEROBIC AND ANAEROBIC Blood Culture results may not be optimal due to an excessive volume of blood received in culture bottles   Culture   Final    NO GROWTH 4 DAYS Performed at [facility], [address] [place], [place] [address]    Report Status PENDING  Incomplete  Aerobic/Anaerobic Culture (  surgical/deep wound)     Status: None (Preliminary result)   Collection Time: 11/19/19 10:23 AM   Specimen: PATH Other; Tissue  Result Value Ref Range Status   Specimen Description WOUND RIGHT KNEE  Final   Special Requests NONE  Final   Gram Stain   Final    FEW WBC PRESENT,BOTH PMN AND MONONUCLEAR NO ORGANISMS SEEN    Culture   Final    NO GROWTH 3 DAYS Performed at [facility], [address], [place], [place] [address]    Report Status PENDING  Incomplete  Aerobic/Anaerobic Culture (surgical/deep wound)     Status: None (Preliminary result)   Collection Time: 11/19/19 10:25 AM   Specimen: PATH Other; Tissue  Result Value Ref Range Status   Specimen Description WOUND LEFT KNEE  Final   Special Requests NONE  Final   Gram Stain   Final    FEW WBC PRESENT,BOTH PMN AND MONONUCLEAR NO ORGANISMS SEEN    Culture   Final    NO GROWTH 3 DAYS Performed at [facility], [address], [place], [place] [address]    Report Status PENDING  Incomplete     Time coordinating discharge: 35 minutes  SIGNED:   Glade Lloyd, MD  [facility] 11/23/2019, 9:45 AM

## 2019-11-23 NOTE — Progress Notes (Signed)
Physical Therapy Treatment Patient Details Name: Darryl Hurley MRN: [id number] DOB: [birth year] Today's Date: 11/23/2019    History of Present Illness 58 year old male with history of hypertension, COPD, chronic hypoxic respiratory failure on 2 L oxygen at home, diabetes mellitus type 2, gout, chronic low back pain and alcohol abuse presented with bilateral knee swelling and pain.  He underwent joint aspiration by ED of the right knee which showed turbid fluid with 431,000 WBCs with 93% neutrophils.  Orthopedics was consulted.  Patient was started on antibiotics.  He underwent I&D of bilateral septic knee joints by orthopedics on 11/19/2019.    PT Comments    Patient progressing well towards PT goals. Improved ambulation distance today with use of RW for support. Reports no knee pain and no evidence of buckling noted today. Eager to return home. VSS on RA throughout activity. Needs cues for safe RW management but able to mobilize at supervision level. Discussed importance of using RW at home to prevent falls. Will follow.    Follow Up Recommendations  Home health PT;Supervision/Assistance - 24 hour     Equipment Recommendations  Rolling walker with 5" wheels    Recommendations for Other Services       Precautions / Restrictions Precautions Precautions: Fall Restrictions Weight Bearing Restrictions: Yes RLE Weight Bearing: Weight bearing as tolerated LLE Weight Bearing: Weight bearing as tolerated    Mobility  Bed Mobility               General bed mobility comments: Sitting EOB upon PT arrival.  Transfers Overall transfer level: Needs assistance Equipment used: Rolling walker (2 wheeled) Transfers: Sit to/from Stand Sit to Stand: Supervision         General transfer comment: Supervision for safety. Stood from [employer].  Ambulation/Gait Ambulation/Gait assistance: Supervision Gait Distance (Feet): 200 Feet Assistive device: Rolling walker (2 wheeled) Gait  Pattern/deviations: Step-through pattern;Decreased stride length Gait velocity: reduced   General Gait Details: Slow, mostly steady gait with RW for support; no knee buckling today. Fatigues. Increased knee flexion bilaterally and cues for RW proximity. VSS   Stairs             Wheelchair Mobility    Modified Rankin (Stroke Patients Only)       Balance Overall balance assessment: Needs assistance Sitting-balance support: No upper extremity supported;Feet supported Sitting balance-Leahy Scale: Good     Standing balance support: During functional activity Standing balance-Leahy Scale: Fair Standing balance comment: Able to stand statically without UE support but needs UE support for walking/dynamic tasks                            Cognition Arousal/Alertness: Awake/alert Behavior During Therapy: WFL for tasks assessed/performed Overall Cognitive Status: No family/caregiver present to determine baseline cognitive functioning                                 General Comments: not able to state year, knows to look at calendar and able to get 2020 with 2 options. Difficulty reasoning what yesterday;s date was after knowing today;s date. Not sure what baseline cognition is.      Exercises      General Comments General comments (skin integrity, edema, etc.): VSS on RA.      Pertinent Vitals/Pain Pain Assessment: No/denies pain    Home Living  Prior Function            PT Goals (current goals can now be found in the care plan section) Progress towards PT goals: Progressing toward goals    Frequency    Min 5X/week      PT Plan Current plan remains appropriate    Co-evaluation              AM-PAC PT "6 Clicks" Mobility   Outcome Measure  Help needed turning from your back to your side while in a flat bed without using bedrails?: None Help needed moving from lying on your back to sitting on the  side of a flat bed without using bedrails?: A Little Help needed moving to and from a bed to a chair (including a wheelchair)?: A Little Help needed standing up from a chair using your arms (e.g., wheelchair or bedside chair)?: A Little Help needed to walk in hospital room?: A Little Help needed climbing 3-5 steps with a railing? : A Little 6 Click Score: 19    End of Session Equipment Utilized During Treatment: Gait belt Activity Tolerance: Patient tolerated treatment well Patient left: in bed;with call bell/phone within reach(sitting EOB, has been walking self to bathroom, unhooking self from monitor) Nurse Communication: Mobility status PT Visit Diagnosis: Other abnormalities of gait and mobility (R26.89)     Time: 0355-9741 PT Time Calculation (min) (ACUTE ONLY): 12 min  Charges:  $Gait Training: 8-22 mins                     Marisa Severin, PT, DPT Acute Rehabilitation Services Pager [phone] Office [phone]       Darryl Hurley 11/23/2019, 9:25 AM

## 2019-11-23 NOTE — Progress Notes (Signed)
Pharmacy Antibiotic Note  DONEL OSOWSKI is a 58 y.o. male admitted on 11/18/2019 with knee pain - possible septic arthritis. Cultures negative thus far, Cr improved.  Plan: -Increase vancomycin to 1500mg  IV q24h - est AUC 517 -Continue ceftriaxone per MD  Height: 5\' 5"  (165.1 cm) Weight: 179 lb 14.3 oz (81.6 kg) IBW/kg (Calculated) : 61.5  Temp (24hrs), Avg:98 F (36.7 C), Min:97.8 F (36.6 C), Max:98.6 F (37 C)  Recent Labs  Lab 11/19/19 0321 11/19/19 0832 11/19/19 1431 11/20/19 0248 11/21/19 0652 11/22/19 0209 11/23/19 0216  WBC 11.9*  --   --  9.4 13.0* 8.1 7.0  CREATININE 1.40*  --  1.94* 1.98* 1.37* 1.01 1.26*  LATICACIDVEN  --  1.1 1.2  --   --   --   --     Estimated Creatinine Clearance: 63.6 mL/min (A) (by C-G formula based on SCr of 1.26 mg/dL (H)).    Allergies  Allergen Reactions  . Nsaids Other (See Comments)    Vagale down reaction  . Toradol [Ketorolac Tromethamine]     vagale down reaction    Antimicrobials this admission: Ancef x3 doses 12/17 Rocephin 12/18 > Vancomycin 12/18 >   Arrie Senate, PharmD, BCPS Clinical Pharmacist [phone] Please check AMION for all [facility] numbers 11/23/2019

## 2019-11-23 NOTE — TOC Initial Note (Addendum)
Transition of Care [facility]) - Initial/Assessment Note    Patient Details  Name: Darryl Hurley MRN: [id number] Date of Birth: Feb 22, 1961  Transition of Care [facility] CM/SW Contact:    Leone Haven, RN Phone Number: 11/23/2019, 9:45 AM  Clinical Narrative:                 Patient lives with a friend at the address listed on file.  His PCP is the [facility] on [address] [place].  NCM offered choice for HHPT, he states he does not have a preference.    Also he would like to have a rollator .  He has transportation at discharge and his has Medicaid for insurance for his medications with a co pay of 3.00. NCM made referral to [facility] and [facility] , they were unable to take referral. NCM asked patient if he could do outpatient physical therpay, he said he can try to do that.  Referral made to outpatient physical therapy on [address]  Thru epic.  Expected Discharge Plan: Home w Home Health Services Barriers to Discharge: No Barriers Identified   Patient Goals and CMS Choice Patient states their goals for this hospitalization and ongoing recovery are:: get better CMS Medicare.gov Compare Post Acute Care list provided to:: Patient Choice offered to / list presented to : Patient  Expected Discharge Plan and Services Expected Discharge Plan: Home w Home Health Services   Discharge Planning Services: CM Consult Post Acute Care Choice: Durable Medical Equipment, Home Health Living arrangements for the past 2 months: Apartment Expected Discharge Date: 11/23/19               DME Arranged: Dan Humphreys rolling with seat DME Agency: [employer] Date DME Agency Contacted: 11/23/19 Time DME Agency Contacted: [phone] Representative spoke with at DME Agency: zack HH Arranged: PT HH Agency: [facility] Date [facility] Agency Contacted: 11/23/19 Time HH Agency Contacted: [phone] Representative spoke with at [facility] Agency: cory  Prior Living Arrangements/Services Living arrangements for  the past 2 months: Apartment Lives with:: Friends Patient language and need for interpreter reviewed:: Yes Do you feel safe going back to the place where you live?: Yes      Need for Family Participation in Patient Care: No (Comment) Care giver support system in place?: No (comment)   Criminal Activity/Legal Involvement Pertinent to Current Situation/Hospitalization: No - Comment as needed  Activities of Daily Living Home Assistive Devices/Equipment: None    Permission Sought/Granted                  Emotional Assessment   Attitude/Demeanor/Rapport: Engaged Affect (typically observed): Appropriate Orientation: : Oriented to Self, Oriented to Place, Oriented to  Time, Oriented to Situation      Admission diagnosis:  Polyarthralgia [M25.50] Polyarthritis [M13.0] Gout [M10.9] Chest pain, unspecified type [R07.9] Patient Active Problem List   Diagnosis Date Noted  . Polyarthralgia 11/19/2019  . Sepsis (HCC) 11/19/2019  . Septic arthritis (HCC) 11/19/2019  . 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 Hyponatremia 11/19/2019  . Alcohol abuse 11/19/2019  . Lactic acidosis 01/06/2016  . Lactic acid acidosis 01/06/2016  . Alcohol abuse with intoxication (HCC) 01/06/2016  . COPD with acute exacerbation (HCC) 02/02/2014  . HCAP (healthcare-associated pneumonia) 02/02/2014  . Increased anion gap metabolic acidosis 02/02/2014  . Cocaine abuse (HCC) 11/18/2013  . Fever 11/17/2013  . Anemia 11/17/2013  . Hypokalemia 11/17/2013  . Polyarthritis 11/17/2013  . Dehydration 11/17/2013  . Type 2 diabetes mellitus (HCC) 05/14/2013  .  Chronic obstructive pulmonary disease (COPD) ([place]) 05/12/2013  . Gout 05/12/2013  . Headache 05/12/2013  . Asthma 05/04/2013  . TOBACCO ABUSE 04/27/2009  . Essential hypertension 04/27/2009   PCP:  Patient, No Pcp Per Pharmacy:   [place], [place] [address] Phone: [phone] Fax:  [phone]  [place], [place] - [address] [place] [address] Phone: [phone] Fax: [place], [place] [address] Phone: [phone] Fax: [phone]  [facility] [place], [place] - [address] [place] [address] Phone: [phone] Fax: [phone]     Social Determinants of Health (SDOH) Interventions    Readmission Risk Interventions No flowsheet data found.

## 2019-11-23 NOTE — Progress Notes (Signed)
Discharge instructions gone over with patient.  He states that he understands and does not have any questions.  IV discontinued. Patient ready for discharge. Darryl Hurley [facility] 11/23/2019 12:26 PM

## 2019-11-23 NOTE — Progress Notes (Signed)
Orthopaedic Trauma Progress Note  S: Doing well today, eager to go home. States he has a ride set up for this morning  O:  Vitals:   11/22/19 2050 11/23/19 0224  BP:    Pulse:  74  Resp:    Temp:    SpO2: 97%     General -sitting up on edge of bed, no acute distress.  Pleasant and cooperative.  Respiratory - No increased work of breathing.  Right Lower Extremity - Dressing over knee is clean dry and intact.  Non-tender with palpation of the knee.   Ankle dorsiflexion plantarflexion is intact.  Able to flex and extend knee without pain. Sensation is intact to light touch distally.  Neurovascularly intact.  Left Lower Extremity - Dressing over knee is clean dry and intact.  Non-tender with palpation of the knee.  Ankle dorsiflexion plantarflexion is intact.  Knee motion without pain. Sensation is intact to light touch distally.  Neurovascularly intact.  Labs:  Results for orders placed or performed during the hospital encounter of 11/18/19 (from the past 24 hour(s))  Glucose, capillary     Status: Abnormal   Collection Time: 11/22/19 11:43 AM  Result Value Ref Range   Glucose-Capillary 151 (H) 70 - 99 mg/dL  Glucose, capillary     Status: Abnormal   Collection Time: 11/22/19  4:18 PM  Result Value Ref Range   Glucose-Capillary 168 (H) 70 - 99 mg/dL  Glucose, capillary     Status: Abnormal   Collection Time: 11/22/19  9:38 PM  Result Value Ref Range   Glucose-Capillary 151 (H) 70 - 99 mg/dL  CBC with Differential/Platelet     Status: Abnormal   Collection Time: 11/23/19  2:16 AM  Result Value Ref Range   WBC 7.0 4.0 - 10.5 K/uL   RBC 4.19 (L) 4.22 - 5.81 MIL/uL   Hemoglobin 12.7 (L) 13.0 - 17.0 g/dL   HCT 51.8 (L) 84.1 - 66.0 %   MCV 91.9 80.0 - 100.0 fL   MCH 30.3 26.0 - 34.0 pg   MCHC 33.0 30.0 - 36.0 g/dL   RDW 63.0 16.0 - 10.9 %   Platelets 466 (H) 150 - 400 K/uL   nRBC 0.4 (H) 0.0 - 0.2 %   Neutrophils Relative % 65 %   Neutro Abs 4.5 1.7 - 7.7 K/uL   Lymphocytes  Relative 21 %   Lymphs Abs 1.5 0.7 - 4.0 K/uL   Monocytes Relative 12 %   Monocytes Absolute 0.9 0.1 - 1.0 K/uL   Eosinophils Relative 1 %   Eosinophils Absolute 0.1 0.0 - 0.5 K/uL   Basophils Relative 1 %   Basophils Absolute 0.0 0.0 - 0.1 K/uL   Immature Granulocytes 0 %   Abs Immature Granulocytes 0.03 0.00 - 0.07 K/uL  Basic metabolic panel     Status: Abnormal   Collection Time: 11/23/19  2:16 AM  Result Value Ref Range   Sodium 140 135 - 145 mmol/L   Potassium 4.1 3.5 - 5.1 mmol/L   Chloride 99 98 - 111 mmol/L   CO2 28 22 - 32 mmol/L   Glucose, Bld 129 (H) 70 - 99 mg/dL   BUN 18 6 - 20 mg/dL   Creatinine, Ser 3.23 (H) 0.61 - 1.24 mg/dL   Calcium 55.7 8.9 - 32.2 mg/dL   GFR calc non Af Amer >60 >60 mL/min   GFR calc Af Amer >60 >60 mL/min   Anion gap 13 5 - 15  Magnesium  Status: None   Collection Time: 11/23/19  2:16 AM  Result Value Ref Range   Magnesium 1.8 1.7 - 2.4 mg/dL  C-reactive protein     Status: Abnormal   Collection Time: 11/23/19  2:16 AM  Result Value Ref Range   CRP 11.4 (H) <1.0 mg/dL  Glucose, capillary     Status: Abnormal   Collection Time: 11/23/19  6:44 AM  Result Value Ref Range   Glucose-Capillary 126 (H) 70 - 99 mg/dL    Assessment: 58 year old male with bilateral septic knee arthritis s/p I&D 11/19/2019   Weightbearing: WBAT BLE  Insicional and dressing care: Dressings are C/D/I. Hemovacs have been removed. Dressings should be changed PRN  CV/Blood loss: Hgb stable  Pain management:  1. Tylenol 650 mg q 6 hours scheduled 2. Robaxin 500 mg q 6 hours PRN 3. Oxycodone 5 mg q 4 hours PRN 4. Neurontin 300 mg BID 5. Morphine 2 mg q 2 hours PRN  VTE prophylaxis: Lovenox while in the hospital, discharge on ASA 325 daily  ID: per primary team  Medical co-morbidities: HTN, hx of gout, DM, tobacco abuse, alcohol abuse  Dispo: Up as tolerated. Okay for discharge form ortho standpoint once cleared by primary team. D/C Rx for pain meds,  DVT prophylaxis and Vit D3 have been sent to pharmacy  Follow - up plan: 2 weeks for suture removal  Contact information:  Katha Hamming MD, Patrecia Pace PA-C   [facility] [phone] (office) [email or web address]

## 2019-11-24 LAB — CULTURE, BLOOD (ROUTINE X 2)
Culture: NO GROWTH
Culture: NO GROWTH

## 2019-11-24 LAB — AEROBIC/ANAEROBIC CULTURE W GRAM STAIN (SURGICAL/DEEP WOUND)
Culture: NO GROWTH
Culture: NO GROWTH

## 2019-11-30 ENCOUNTER — Emergency Department (HOSPITAL_COMMUNITY)
Admission: EM | Admit: 2019-11-30 | Discharge: 2019-12-01 | Discharge status: Against Medical Advice | Payer: Medicaid Other | Attending: Emergency Medicine | Admitting: Emergency Medicine

## 2019-11-30 ENCOUNTER — Other Ambulatory Visit: Payer: Self-pay

## 2019-11-30 ENCOUNTER — Encounter (HOSPITAL_COMMUNITY): Payer: Self-pay | Admitting: Emergency Medicine

## 2019-11-30 ENCOUNTER — Ambulatory Visit: Payer: Medicaid Other | Attending: Internal Medicine | Admitting: Physical Therapy

## 2019-11-30 DIAGNOSIS — M25579 Pain in unspecified ankle and joints of unspecified foot: Secondary | ICD-10-CM | POA: Diagnosis present

## 2019-11-30 DIAGNOSIS — Z5321 Procedure and treatment not carried out due to patient leaving prior to being seen by health care provider: Secondary | ICD-10-CM | POA: Diagnosis not present

## 2019-11-30 NOTE — ED Notes (Signed)
Called pt to reassess vitals x3 had no answer.

## 2019-11-30 NOTE — ED Triage Notes (Signed)
Pt brought to ED by [facility] for c/o bilateral ankle pain. ETOH on board.

## 2019-11-30 NOTE — ED Notes (Signed)
Called pt to reassess vitals x3 and had no answer. 

## 2020-03-25 ENCOUNTER — Other Ambulatory Visit: Payer: Self-pay

## 2020-03-25 ENCOUNTER — Encounter (HOSPITAL_COMMUNITY): Payer: Self-pay

## 2020-03-25 ENCOUNTER — Emergency Department (HOSPITAL_COMMUNITY): Payer: Medicaid Other

## 2020-03-25 ENCOUNTER — Emergency Department (HOSPITAL_COMMUNITY)
Admission: EM | Admit: 2020-03-25 | Discharge: 2020-03-25 | Disposition: A | Discharge status: Home / Self Care | Payer: Medicaid Other | Attending: Emergency Medicine | Admitting: Emergency Medicine

## 2020-03-25 DIAGNOSIS — Z79899 Other long term (current) drug therapy: Secondary | ICD-10-CM | POA: Insufficient documentation

## 2020-03-25 DIAGNOSIS — J449 Chronic obstructive pulmonary disease, unspecified: Secondary | ICD-10-CM | POA: Insufficient documentation

## 2020-03-25 DIAGNOSIS — F1410 Cocaine abuse, uncomplicated: Secondary | ICD-10-CM | POA: Diagnosis not present

## 2020-03-25 DIAGNOSIS — F17210 Nicotine dependence, cigarettes, uncomplicated: Secondary | ICD-10-CM | POA: Insufficient documentation

## 2020-03-25 DIAGNOSIS — R109 Unspecified abdominal pain: Secondary | ICD-10-CM

## 2020-03-25 DIAGNOSIS — K769 Liver disease, unspecified: Secondary | ICD-10-CM | POA: Diagnosis not present

## 2020-03-25 DIAGNOSIS — E119 Type 2 diabetes mellitus without complications: Secondary | ICD-10-CM | POA: Diagnosis not present

## 2020-03-25 DIAGNOSIS — I10 Essential (primary) hypertension: Secondary | ICD-10-CM | POA: Insufficient documentation

## 2020-03-25 LAB — BASIC METABOLIC PANEL
Anion gap: 12 (ref 5–15)
BUN: 10 mg/dL (ref 6–20)
CO2: 24 mmol/L (ref 22–32)
Calcium: 9.7 mg/dL (ref 8.9–10.3)
Chloride: 102 mmol/L (ref 98–111)
Creatinine, Ser: 1.11 mg/dL (ref 0.61–1.24)
GFR calc Af Amer: >60 mL/min (ref >60)
GFR calc non Af Amer: >60 mL/min (ref >60)
Glucose, Bld: 123 mg/dL — ABNORMAL HIGH (ref 70–99)
Potassium: 4 mmol/L (ref 3.5–5.1)
Sodium: 138 mmol/L (ref 135–145)

## 2020-03-25 LAB — CBC
HCT: 48 % (ref 39.0–52.0)
Hemoglobin: 15.4 g/dL (ref 13.0–17.0)
MCH: 29.6 pg (ref 26.0–34.0)
MCHC: 32.1 g/dL (ref 30.0–36.0)
MCV: 92.1 fL (ref 80.0–100.0)
Platelets: 485 10*3/uL — ABNORMAL HIGH (ref 150–400)
RBC: 5.21 MIL/uL (ref 4.22–5.81)
RDW: 16.3 % — ABNORMAL HIGH (ref 11.5–15.5)
WBC: 6.8 10*3/uL (ref 4.0–10.5)
nRBC: 0 % (ref 0.0–0.2)

## 2020-03-25 LAB — URINALYSIS, ROUTINE W REFLEX MICROSCOPIC
Bilirubin Urine: NEGATIVE
Glucose, UA: NEGATIVE mg/dL
Hgb urine dipstick: NEGATIVE
Ketones, ur: NEGATIVE mg/dL
Leukocytes,Ua: NEGATIVE
Nitrite: NEGATIVE
Protein, ur: 100 mg/dL — AB
Specific Gravity, Urine: 1.016 (ref 1.005–1.030)
pH: 6 (ref 5.0–8.0)

## 2020-03-25 LAB — HEPATIC FUNCTION PANEL
ALT: 17 U/L (ref 0–44)
AST: 25 U/L (ref 15–41)
Albumin: 3.5 g/dL (ref 3.5–5.0)
Alkaline Phosphatase: 63 U/L (ref 38–126)
Bilirubin, Direct: 0.1 mg/dL (ref 0.0–0.2)
Indirect Bilirubin: 0.9 mg/dL (ref 0.3–0.9)
Total Bilirubin: 1 mg/dL (ref 0.3–1.2)
Total Protein: 7.5 g/dL (ref 6.5–8.1)

## 2020-03-25 LAB — LIPASE, BLOOD: Lipase: 33 U/L (ref 11–51)

## 2020-03-25 IMAGING — CT CT ABD-PELV W/ CM
2 of 5 series · 15 of 46 positions shown, 17 images · IV contrast (APPLIED)
Comparison: [DATE]

CLINICAL DATA: Right-sided abdominal and flank pain.

EXAM:
CT ABDOMEN AND PELVIS WITH CONTRAST
TECHNIQUE: Multidetector CT imaging of the abdomen and pelvis was performed
using the standard protocol following bolus administration of
intravenous contrast.
CONTRAST:  100mL OMNIPAQUE IOHEXOL 300 MG/ML  SOLN

[Series 3: abd/ pelvis 5.0 i30f 2 · axial · 0.77mm/px · z∈[+673,+1148]mm · 12 of 107 slices shown, 14 images]
[im 6/107  soft-tissue]
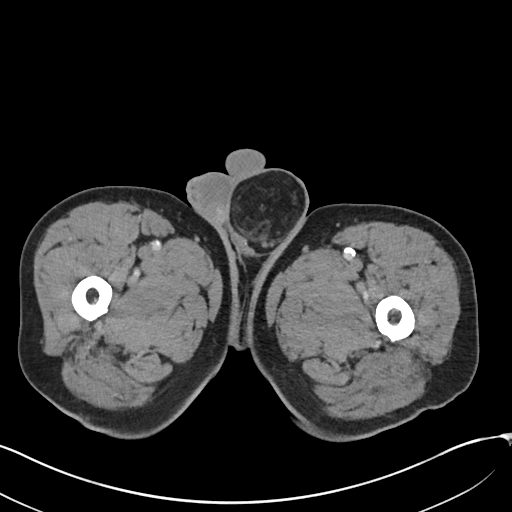
[im 6/107  bone]
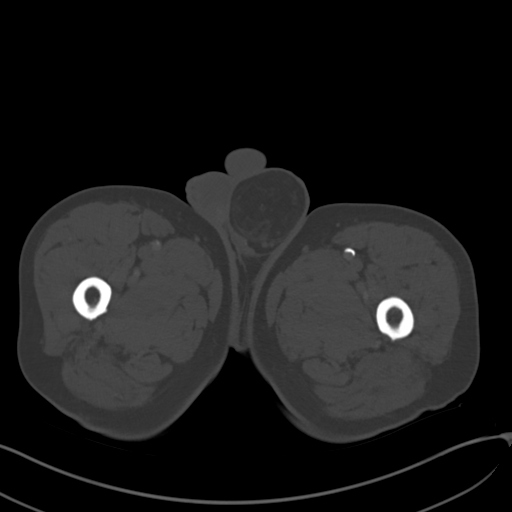
[im 18/107  soft-tissue]
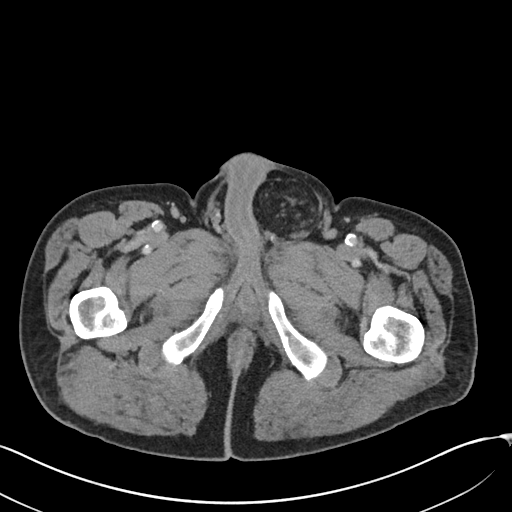
[im 24/107  soft-tissue]
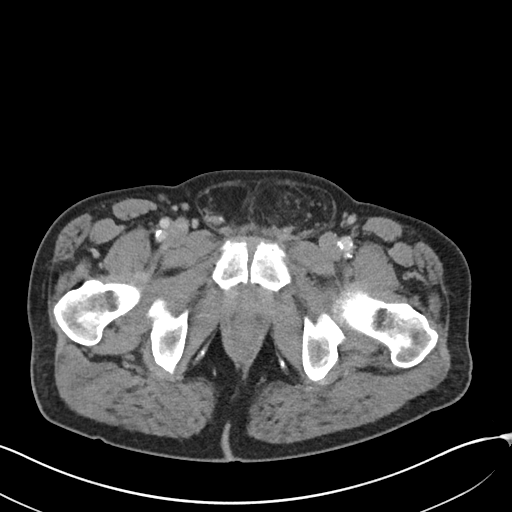
[im 30/107  soft-tissue]
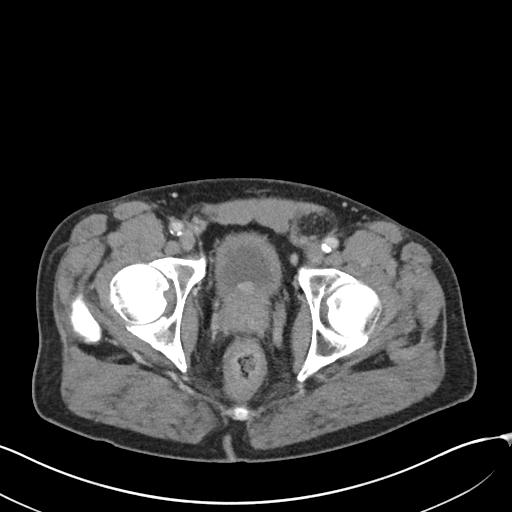
[im 42/107  soft-tissue]
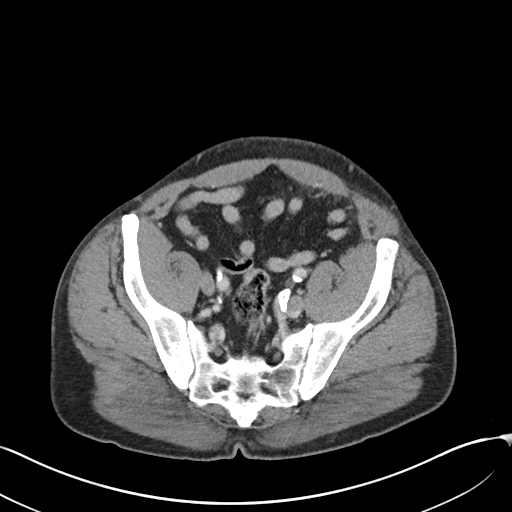
[im 48/107  soft-tissue]
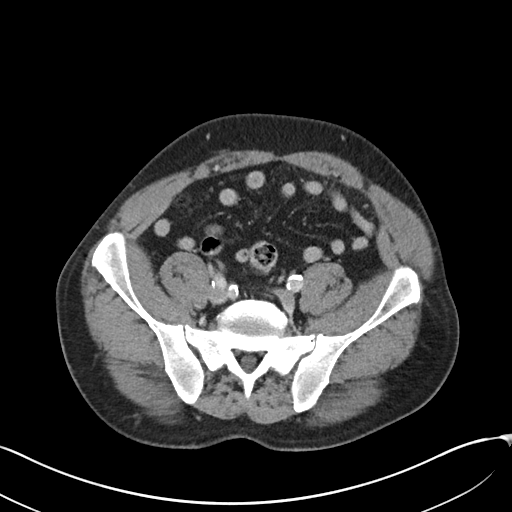
[im 59/107  soft-tissue]
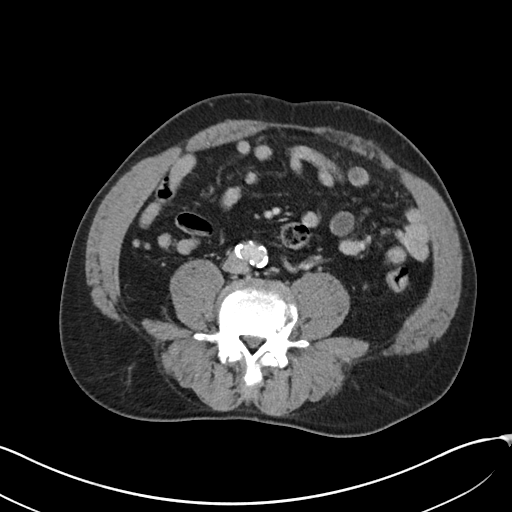
[im 65/107  soft-tissue]
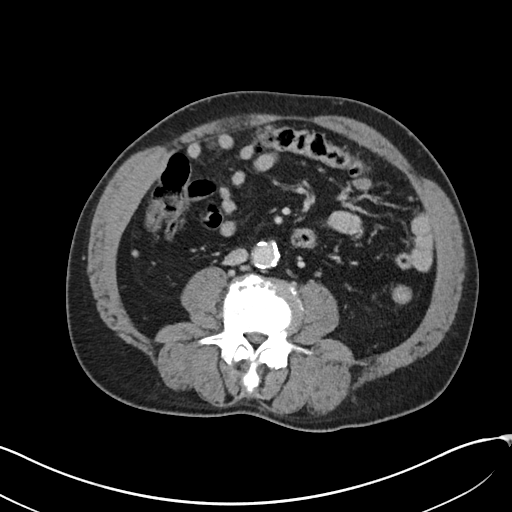
[im 77/107  soft-tissue]
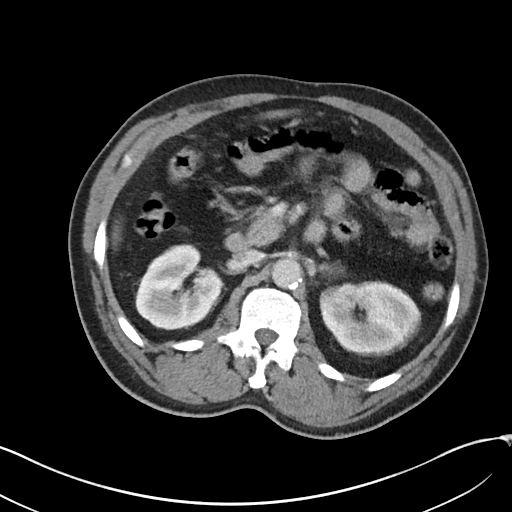
[im 77/107  bone]
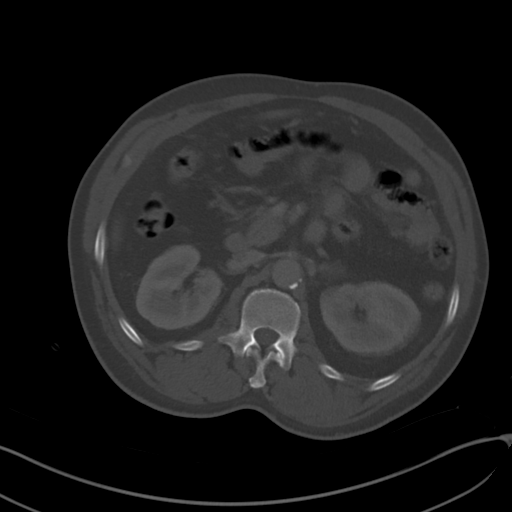
[im 83/107  soft-tissue]
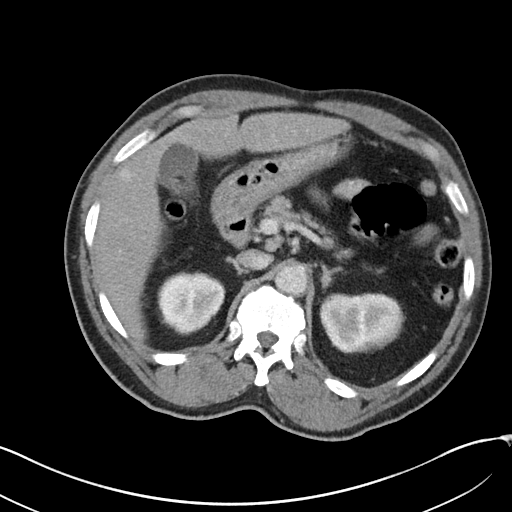
[im 89/107  soft-tissue]
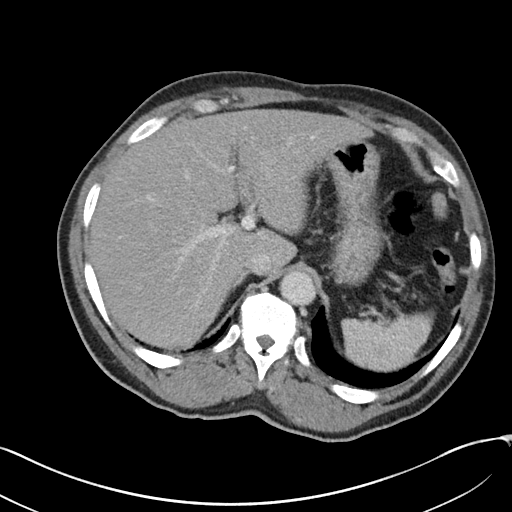
[im 101/107  soft-tissue]
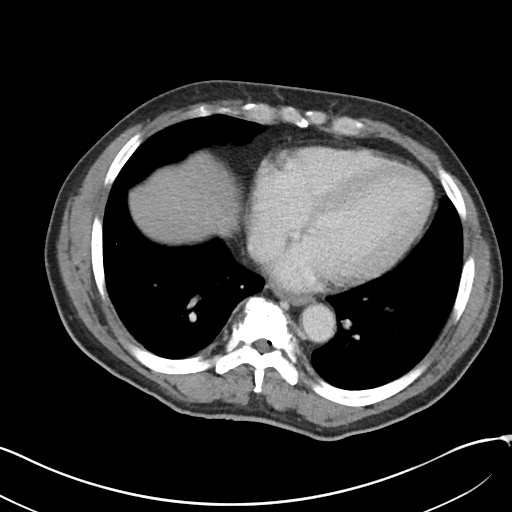

[Series 6: coronal soft tissue · coronal · 0.86mm/px · 3 of 108 slices shown]
[im 36/108  soft-tissue]
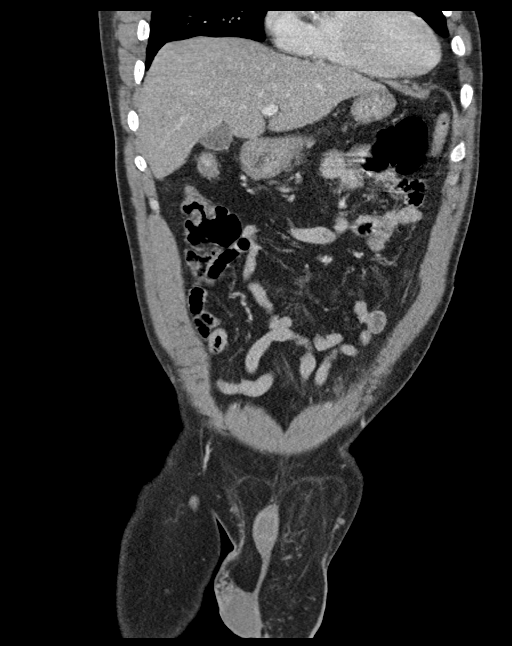
[im 48/108  soft-tissue]
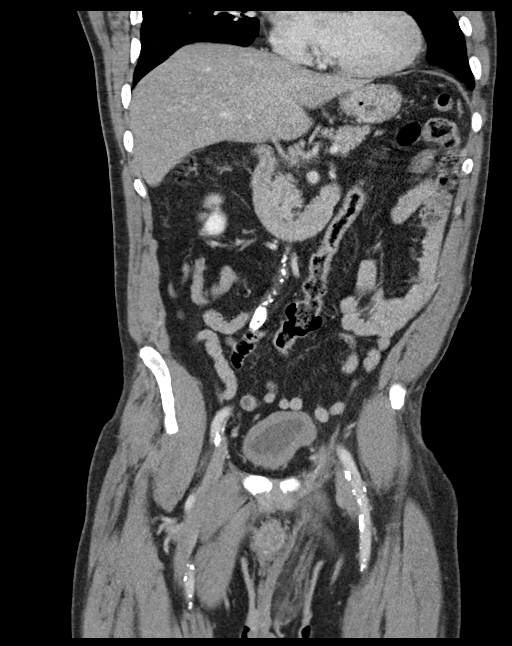
[im 60/108  soft-tissue]
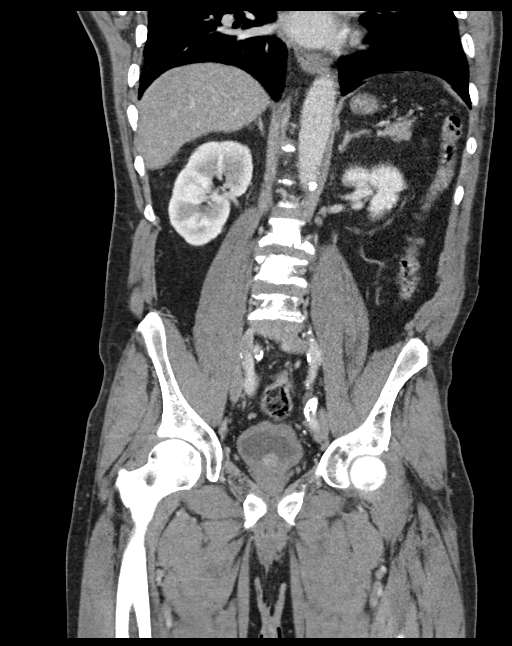

[15 of 46 positions shown; findings below may reference images not displayed]

FINDINGS: Lower chest: Unremarkable

Hepatobiliary: Multiple hypervascular lesions are identified in the
liver measuring 8 mm in the posterior dome ([DATE]) and 1.3 cm in the
inferior aspect of the posterior right liver (image 23/series 3).
Subcapsular hypervascular lesion measuring 11 mm seen in the
inferior right liver on [DATE]. These 2 dominant lesions were visible
on the prior study from 1 year ago and are stable in the interval.
There is no evidence for gallstones, gallbladder wall thickening, or
pericholecystic fluid. No intrahepatic or extrahepatic biliary
dilation.

Pancreas: No focal mass lesion. No dilatation of the main duct. No
intraparenchymal cyst. No peripancreatic edema.

Spleen: No splenomegaly. No focal mass lesion.

Adrenals/Urinary Tract: No adrenal nodule or mass. Right kidney
unremarkable. 6 mm hypoattenuating lesion lower pole left kidney is
too small to characterize but likely benign. No evidence for
hydroureter. Mild circumferential bladder wall thickening noted and
likely accentuated by underdistention.

Stomach/Bowel: Stomach is unremarkable. No gastric wall thickening.
No evidence of outlet obstruction. Duodenum is normally positioned
as is the ligament of Treitz. No small bowel wall thickening. No
small bowel dilatation. The terminal ileum is normal. The appendix
is normal. No gross colonic mass. No colonic wall thickening.

Vascular/Lymphatic: There is abdominal aortic atherosclerosis
without aneurysm. There is no gastrohepatic or hepatoduodenal
ligament lymphadenopathy. No retroperitoneal or mesenteric
lymphadenopathy. No pelvic sidewall lymphadenopathy. Upper normal
lymph nodes along each pelvic sidewall are stable since prior study.

Reproductive: Prostate gland is enlarged with heterogeneous
enhancement, nonspecific.

Other: No intraperitoneal free fluid.

Musculoskeletal: Bilateral groin hernias, left greater than right,
contain only fat. No worrisome lytic or sclerotic osseous
abnormality.
IMPRESSION: 1. No acute findings in the abdomen or pelvis. Specifically, no
findings to explain the patient's history of right-sided abdominal
and flank.
2. Multiple hypervascular lesions in the liver measuring up to
cm. The 2 dominant lesions are stable since the study from 1 year
ago and are likely benign. In a patient with no known primary
malignancy or hepatic disease, consensus criteria would suggest no
further imaging follow-up warranted. With a history of primary
malignancy or risk factors for primary hepatic disease, MRI of the
abdomen with and without contrast recommended. This recommendation
follows ACR consensus guidelines: Management of Incidental Liver
Lesions on CT: A White Paper of the ACR Incidental Findings
Committee. [HOSPITAL] [TV]; 14:[PHONE_NUMBER].
3. Bilateral groin hernias, left greater than right, contain only
fat.
4. Prostatomegaly with heterogeneous enhancement, nonspecific.
5. Aortic Atherosclerosis ([TV]-[TV]).

## 2020-03-25 MED ORDER — METHOCARBAMOL 750 MG PO TABS
750.0000 mg | ORAL_TABLET | Freq: Every evening | ORAL | 0 refills | Status: DC | PRN
Start: 2020-03-25 — End: 2020-03-25

## 2020-03-25 MED ORDER — SODIUM CHLORIDE 0.9 % IV BOLUS
1000.0000 mL | Freq: Once | INTRAVENOUS | Status: AC
  Administered 2020-03-25: 1000 mL via INTRAVENOUS

## 2020-03-25 MED ORDER — DICLOFENAC SODIUM 1 % EX GEL: 2.0000 g | Freq: Four times a day (QID) | CUTANEOUS | 0 refills | Status: DC

## 2020-03-25 MED ORDER — IOHEXOL 300 MG/ML  SOLN
100.0000 mL | Freq: Once | INTRAMUSCULAR | Status: AC | PRN
  Administered 2020-03-25: 100 mL via INTRAVENOUS

## 2020-03-25 MED ORDER — AMLODIPINE BESYLATE 5 MG PO TABS
10.0000 mg | ORAL_TABLET | Freq: Once | ORAL | Status: AC
  Administered 2020-03-25: 10 mg via ORAL
  Filled 2020-03-25: qty 2

## 2020-03-25 MED ORDER — LISINOPRIL 20 MG PO TABS
20.0000 mg | ORAL_TABLET | Freq: Once | ORAL | Status: AC
  Administered 2020-03-25: 20 mg via ORAL
  Filled 2020-03-25: qty 1

## 2020-03-25 MED ORDER — HYDROMORPHONE HCL 1 MG/ML IJ SOLN
1.0000 mg | Freq: Once | INTRAMUSCULAR | Status: AC
  Administered 2020-03-25: 1 mg via INTRAVENOUS
  Filled 2020-03-25: qty 1

## 2020-03-25 MED ORDER — CLONIDINE HCL 0.1 MG PO TABS
0.1000 mg | ORAL_TABLET | Freq: Once | ORAL | Status: AC
  Administered 2020-03-25: 0.1 mg via ORAL
  Filled 2020-03-25: qty 1

## 2020-03-25 MED ORDER — MORPHINE SULFATE (PF) 4 MG/ML IV SOLN
4.0000 mg | Freq: Once | INTRAVENOUS | Status: AC
  Administered 2020-03-25: 4 mg via INTRAVENOUS
  Filled 2020-03-25: qty 1

## 2020-03-25 MED ORDER — LIDOCAINE 5 % EX PTCH: 1.0000 | MEDICATED_PATCH | CUTANEOUS | 0 refills | Status: DC

## 2020-03-25 NOTE — ED Triage Notes (Signed)
Pt presents w/Right flank pain x1 week. Reports pain eases up and he was hoping it would go away

## 2020-03-25 NOTE — Discharge Instructions (Addendum)
Use diclofenac as directed for pain.  You were given a prescription for lidoderm patches. When not using the diclofenac gel you can also use the lidoderm patches.  On your CT scan today you were noted to have a liver lesion.  You will need to schedule an appoint with your regular doctor and you will need to have an outpatient MRI completed of your liver.  Please follow up with your primary care provider within 5-7 days for re-evaluation of your symptoms. If you do not have a primary care provider, information for a healthcare clinic has been provided for you to make arrangements for follow up care. Please return to the emergency department for any new or worsening symptoms.

## 2020-03-25 NOTE — ED Provider Notes (Signed)
[facility] EMERGENCY DEPARTMENT Provider Note   CSN: [id number] Arrival date & time: 03/25/20  1005     History Chief Complaint  Patient presents with  . Flank Pain    Darryl Hurley is a 59 y.o. male.  HPI   Pt is a 59 y/o male with a h/o ETOH abuse, COPD, diabetes, hypertension, seizures,, who presents to the ED today for eval of right flank pain that started about 1 week ago. Pain is intermittent. Rates pain 9/10. Pain feels sharp and radiates to the right side of the abdomen.  States he took a pain pill that did not improve his sxs. Moving and certain positions make the pain worse. Denies associated fevers, SOB, chest pain, nausea, vomiting, diarrhea, constipation, dysuria, or hematuria.  States he does have a history of EtOH use and he is not sure if he has fallen recently.  He did not take his BP meds today   Past Medical History:  Diagnosis Date  . Alcohol abuse   . Angina pectoris   . Arthritis   . Asthma   . Chronic lower back pain   . COPD (chronic obstructive pulmonary disease) (HCC)    on home O2 2L  . Diabetes mellitus without complication ([place])   . Exertional shortness of breath   . Gout   . Hypertension   . Seizures [facility])     Patient Active Problem List   Diagnosis Date Noted  . Polyarthralgia 11/19/2019  . Sepsis ([place]) 11/19/2019  . Septic arthritis ([place]) 11/19/2019  . 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 Hyponatremia 11/19/2019  . Alcohol abuse 11/19/2019  . Lactic acidosis 01/06/2016  . Lactic acid acidosis 01/06/2016  . Alcohol abuse with intoxication ([place]) 01/06/2016  . COPD with acute exacerbation ([place]) 02/02/2014  . HCAP (healthcare-associated pneumonia) 02/02/2014  . Increased anion gap metabolic acidosis 62/70/3500  . Cocaine abuse ([place]) 11/18/2013  . Fever 11/17/2013  . Anemia 11/17/2013  . Hypokalemia 11/17/2013  . Polyarthritis 11/17/2013  . Dehydration 11/17/2013  . Type 2 diabetes mellitus ([place]) 05/14/2013  .  Chronic obstructive pulmonary disease (COPD) ([place]) 05/12/2013  . Gout 05/12/2013  . Headache 05/12/2013  . Asthma 05/04/2013  . TOBACCO ABUSE 04/27/2009  . Essential hypertension 04/27/2009    Past Surgical History:  Procedure Laterality Date  . INCISION AND DRAINAGE ABSCESS Bilateral 11/19/2019   Procedure: INCISION AND DRAINAGE BILATERAL SEPTIC KNEES, ASPIRATION OF LEFT KNEE;  Surgeon: Shona Needles, MD;  Location: [place];  Service: Orthopedics;  Laterality: Bilateral;  . NO PAST SURGERIES         Family History  Problem Relation Age of Onset  . Diabetes type II Mother   . Depression Father   . Suicidality Father   . Asthma Brother     Social History   Tobacco Use  . Smoking status: Current Every Day Smoker    Packs/day: 0.25    Years: 40.00    Pack years: 10.00    Types: Cigarettes  . Smokeless tobacco: Never Used  . Tobacco comment: 05/12/2013 'eased off smoking since last month"  Substance Use Topics  . Alcohol use: Yes    Comment: 4 40 oz beers per day, 1 pint of liqor in a day  . Drug use: No    Types: "Crack" cocaine    Home Medications Prior to Admission medications   Medication Sig Start Date End Date Taking? Authorizing Provider  acetaminophen (TYLENOL) 325 MG tablet Take 2 tablets (650 mg  total) by mouth every 6 (six) hours as needed. 05/20/19   Lamptey, Britta Mccreedy, MD  albuterol (PROVENTIL HFA;VENTOLIN HFA) 108 (90 BASE) MCG/ACT inhaler Inhale 1 puff into the lungs every 6 (six) hours as needed for wheezing or shortness of breath.    [provider]  allopurinol (ZYLOPRIM) 100 MG tablet Take 100 mg by mouth daily.    [provider]  amLODipine (NORVASC) 10 MG tablet Take 10 mg by mouth daily. 08/06/18   [provider]  Cholecalciferol (VITAMIN D-3) 125 MCG (5000 UT) TABS Take 5,000 Units by mouth daily. 11/23/19   Despina Hidden, PA-C  cloNIDine (CATAPRES) 0.1 MG tablet Take 0.1 mg by mouth 2 (two) times daily. 08/15/18    [provider]  diclofenac Sodium (VOLTAREN) 1 % GEL Apply 2 g topically 4 (four) times daily. 03/25/20   Archer Moist S, PA-C  FLOVENT HFA 110 MCG/ACT inhaler Inhale 1 puff into the lungs 2 (two) times daily as needed (shortness of breath).  08/06/18   [provider]  fluticasone (FLONASE) 50 MCG/ACT nasal spray Place 1 spray into both nostrils daily. 11/05/19   [provider]  folic acid (FOLVITE) 1 MG tablet Take 1 tablet (1 mg total) by mouth daily. 11/23/19   Glade Lloyd, MD  gabapentin (NEURONTIN) 300 MG capsule Take 300 mg by mouth 2 (two) times daily. 03/04/19   [provider]  HYDROcodone-acetaminophen (NORCO/VICODIN) 5-325 MG tablet Take 1 tablet by mouth every 6 (six) hours as needed for moderate pain. 11/23/19   Despina Hidden, PA-C  lidocaine (LIDODERM) 5 % Place 1 patch onto the skin daily. Remove & Discard patch within 12 hours or as directed by MD 03/25/20   Jurnee Nakayama S, PA-C  lisinopril (ZESTRIL) 20 MG tablet Take 20 mg by mouth daily.    [provider]  meloxicam (MOBIC) 7.5 MG tablet Take 7.5 mg by mouth daily.    [provider]  Multiple Vitamin (MULTIVITAMIN WITH MINERALS) TABS tablet Take 1 tablet by mouth daily. 11/23/19   Glade Lloyd, MD  nitroGLYCERIN (NITROSTAT) 0.4 MG SL tablet Place 1 tablet (0.4 mg total) under the tongue every 5 (five) minutes x 3 doses as needed for chest pain. 03/18/19   Patwardhan, Anabel Bene, MD  rosuvastatin (CRESTOR) 40 MG tablet Take 40 mg by mouth daily.    [provider]  SYMBICORT 160-4.5 MCG/ACT inhaler Inhale 2 puffs into the lungs 2 (two) times daily. 11/05/19   [provider]  thiamine 100 MG tablet Take 1 tablet (100 mg total) by mouth daily. 11/23/19   Glade Lloyd, MD  tiZANidine (ZANAFLEX) 4 MG capsule Take 4 mg by mouth as needed for muscle spasms.  06/25/18   [provider]  traZODone (DESYREL) 50 MG tablet Take 1 tablet (50 mg total) by  mouth at bedtime as needed for sleep. 05/20/19   Lamptey, Britta Mccreedy, MD  labetalol (NORMODYNE) 200 MG tablet Take 1 tablet (200 mg total) by mouth 2 (two) times daily. Patient not taking: Reported on 03/25/2019 02/16/19 05/20/19  Elder Negus, MD    Allergies    Nsaids and Toradol [ketorolac tromethamine]  Review of Systems   Review of Systems  Constitutional: Negative for chills and fever.  HENT: Negative for ear pain and sore throat.   Eyes: Negative for visual disturbance.  Respiratory: Negative for cough and shortness of breath.   Cardiovascular: Negative for chest pain.  Gastrointestinal: Positive for abdominal pain. Negative  for constipation, diarrhea, nausea and vomiting.  Genitourinary: Positive for flank pain. Negative for dysuria, hematuria and urgency.  Musculoskeletal: Negative for back pain.  Skin: Negative for rash.  Neurological: Negative for headaches.  All other systems reviewed and are negative.   Physical Exam Updated Vital Signs BP (!) 170/113   Pulse 94   Temp 97.8 F (36.6 C) (Oral)   Resp 16   Ht 5\' 5"  (1.651 m)   Wt 54.4 kg   SpO2 100%   BMI 19.97 kg/m   Physical Exam Vitals and nursing note reviewed.  Constitutional:      Appearance: He is well-developed.  HENT:     Head: Normocephalic and atraumatic.  Eyes:     Conjunctiva/sclera: Conjunctivae normal.  Cardiovascular:     Rate and Rhythm: Normal rate and regular rhythm.     Pulses:          Dorsalis pedis pulses are 2+ on the right side and 2+ on the left side.     Heart sounds: Normal heart sounds. No murmur.  Pulmonary:     Effort: Pulmonary effort is normal. No respiratory distress.     Breath sounds: Wheezing present. No rhonchi or rales.  Chest:     Chest wall: No tenderness.  Abdominal:     General: Bowel sounds are normal. There is no distension.     Palpations: Abdomen is soft.     Tenderness: There is abdominal tenderness in the right upper quadrant and right lower  quadrant. There is right CVA tenderness. There is no left CVA tenderness, guarding or rebound.  Musculoskeletal:     Cervical back: Neck supple.       Back:     Comments: TTP to the right mid/lower thoracic paraspinous muscles  Skin:    General: Skin is warm and dry.  Neurological:     Mental Status: He is alert.     ED Results / Procedures / Treatments   Labs (all labs ordered are listed, but only abnormal results are displayed) Labs Reviewed  URINALYSIS, ROUTINE W REFLEX MICROSCOPIC - Abnormal; Notable for the following components:      Result Value   Protein, ur 100 (*)    Bacteria, UA RARE (*)    All other components within normal limits  BASIC METABOLIC PANEL - Abnormal; Notable for the following components:   Glucose, Bld 123 (*)    All other components within normal limits  CBC - Abnormal; Notable for the following components:   RDW 16.3 (*)    Platelets 485 (*)    All other components within normal limits  HEPATIC FUNCTION PANEL  LIPASE, BLOOD    EKG None  Radiology CT ABDOMEN PELVIS W CONTRAST  Result Date: 03/25/2020 CLINICAL DATA:  Right-sided abdominal and flank pain. EXAM: CT ABDOMEN AND PELVIS WITH CONTRAST TECHNIQUE: Multidetector CT imaging of the abdomen and pelvis was performed using the standard protocol following bolus administration of intravenous contrast. CONTRAST:  100mL OMNIPAQUE IOHEXOL 300 MG/ML  SOLN COMPARISON:  03/17/2019 FINDINGS: Lower chest: Unremarkable Hepatobiliary: Multiple hypervascular lesions are identified in the liver measuring 8 mm in the posterior dome (14/3) and 1.3 cm in the inferior aspect of the posterior right liver (image 23/series 3). Subcapsular hypervascular lesion measuring 11 mm seen in the inferior right liver on 26/3. These 2 dominant lesions were visible on the prior study from 1 year ago and are stable in the interval. There is no evidence for gallstones, gallbladder wall thickening, or  pericholecystic fluid. No  intrahepatic or extrahepatic biliary dilation. Pancreas: No focal mass lesion. No dilatation of the main duct. No intraparenchymal cyst. No peripancreatic edema. Spleen: No splenomegaly. No focal mass lesion. Adrenals/Urinary Tract: No adrenal nodule or mass. Right kidney unremarkable. 6 mm hypoattenuating lesion lower pole left kidney is too small to characterize but likely benign. No evidence for hydroureter. Mild circumferential bladder wall thickening noted and likely accentuated by underdistention. Stomach/Bowel: Stomach is unremarkable. No gastric wall thickening. No evidence of outlet obstruction. Duodenum is normally positioned as is the ligament of Treitz. No small bowel wall thickening. No small bowel dilatation. The terminal ileum is normal. The appendix is normal. No gross colonic mass. No colonic wall thickening. Vascular/Lymphatic: There is abdominal aortic atherosclerosis without aneurysm. There is no gastrohepatic or hepatoduodenal ligament lymphadenopathy. No retroperitoneal or mesenteric lymphadenopathy. No pelvic sidewall lymphadenopathy. Upper normal lymph nodes along each pelvic sidewall are stable since prior study. Reproductive: Prostate gland is enlarged with heterogeneous enhancement, nonspecific. Other: No intraperitoneal free fluid. Musculoskeletal: Bilateral groin hernias, left greater than right, contain only fat. No worrisome lytic or sclerotic osseous abnormality. IMPRESSION: 1. No acute findings in the abdomen or pelvis. Specifically, no findings to explain the patient's history of right-sided abdominal and flank. 2. Multiple hypervascular lesions in the liver measuring up to 1.3 cm. The 2 dominant lesions are stable since the study from 1 year ago and are likely benign. In a patient with no known primary malignancy or hepatic disease, consensus criteria would suggest no further imaging follow-up warranted. With a history of primary malignancy or risk factors for primary hepatic  disease, MRI of the abdomen with and without contrast recommended. This recommendation follows ACR consensus guidelines: Management of Incidental Liver Lesions on CT: A White Paper of the ACR Incidental Findings Committee. J Am Coll Radiol 2017; 09:2330-0762. 3. Bilateral groin hernias, left greater than right, contain only fat. 4. Prostatomegaly with heterogeneous enhancement, nonspecific. 5. Aortic Atherosclerosis (ICD10-I70.0). Electronically Signed   By: Kennith Center M.D.   On: 03/25/2020 15:36    Procedures Procedures (including critical care time)  Medications Ordered in ED Medications  sodium chloride 0.9 % bolus 1,000 mL (0 mLs Intravenous Stopped 03/25/20 1552)  morphine 4 MG/ML injection 4 mg (4 mg Intravenous Given 03/25/20 1426)  lisinopril (ZESTRIL) tablet 20 mg (20 mg Oral Given 03/25/20 1424)  amLODipine (NORVASC) tablet 10 mg (10 mg Oral Given 03/25/20 1424)  cloNIDine (CATAPRES) tablet 0.1 mg (0.1 mg Oral Given 03/25/20 1424)  iohexol (OMNIPAQUE) 300 MG/ML solution 100 mL (100 mLs Intravenous Contrast Given 03/25/20 1456)  HYDROmorphone (DILAUDID) injection 1 mg (1 mg Intravenous Given 03/25/20 1633)    ED Course  I have reviewed the triage vital signs and the nursing notes.  Pertinent labs & imaging results that were available during my care of the patient were reviewed by me and considered in my medical decision making (see chart for details).    MDM Rules/Calculators/A&P                      Patient is a 59 year old male presenting for evaluation of right flank and right-sided abdominal pain that started 1 week ago.  It is not associated with any other symptoms.  He denies any chest pain or shortness of breath.  On arrival he is noted to be hypertensive, but otherwise his vital signs are reassuring.  He notes he did not take his blood pressure medications today.  Reviewed/interpreted labs  CBC is without leukocytosis, does have thrombocytosis which she has had  previously BMP with normal electrolytes.  Normal kidney function Liver enzymes are normal Lipase is now All UA is without hematuria.  No leukocytes, nitrites or other abnormalities to suggest infection.  CT of the abdomen/pelvis does not show any acute findings in the abdomen/pelvis.  He does have several liver lesions that are nonspecific.  He also has prostatomegaly.  Also with bilateral hernias in the groin that are containing fat only.  Patient was given doses of pain medications and IV fluids in the ED.  On evaluation he states he is still in pain but he appears to be much more comfortable and is texting or on his phone each time I come into the room.  He is in no acute distress.  He was able to tolerate p.o.  Discussed the findings of his CT scan showing no acute findings.  I have low suspicion for other emergent pathology such as dissection given the duration of his sxs and benign presentation today. I suspect his symptoms may be musculoskeletal in nature.  I will give him diclofenac gel and Lidoderm patches. Advised pcp f/u for his sxs and in regard to his liver lesions. advised on return precautions. He voices understanding of the plan and reasons to return.  All questions answered.  Patient stable for discharge.  Final Clinical Impression(s) / ED Diagnoses Final diagnoses:  Flank pain  Liver lesion    Rx / DC Orders ED Discharge Orders         Ordered    diclofenac Sodium (VOLTAREN) 1 % GEL  4 times daily     03/25/20 1726    methocarbamol (ROBAXIN) 750 MG tablet  At bedtime PRN,   Status:  Discontinued     03/25/20 1726    lidocaine (LIDODERM) 5 %  Every 24 hours     03/25/20 1729           Karrie Meres, PA-C 03/25/20 1747    Tegeler, Canary Brim, MD 03/28/20 [phone]

## 2020-03-25 NOTE — ED Notes (Signed)
Patient verbalizes understanding of discharge instructions. Opportunity for questioning and answers were provided. Pt discharged from ED. 

## 2020-06-08 ENCOUNTER — Encounter (HOSPITAL_COMMUNITY): Payer: Self-pay

## 2020-06-08 ENCOUNTER — Other Ambulatory Visit: Payer: Self-pay

## 2020-06-08 ENCOUNTER — Emergency Department (HOSPITAL_COMMUNITY)
Admission: EM | Admit: 2020-06-08 | Discharge: 2020-06-08 | Disposition: A | Discharge status: Home / Self Care | Payer: Medicaid Other | Attending: Emergency Medicine | Admitting: Emergency Medicine

## 2020-06-08 DIAGNOSIS — Y929 Unspecified place or not applicable: Secondary | ICD-10-CM | POA: Diagnosis not present

## 2020-06-08 DIAGNOSIS — Y999 Unspecified external cause status: Secondary | ICD-10-CM | POA: Diagnosis not present

## 2020-06-08 DIAGNOSIS — X58XXXA Exposure to other specified factors, initial encounter: Secondary | ICD-10-CM | POA: Diagnosis not present

## 2020-06-08 DIAGNOSIS — F17210 Nicotine dependence, cigarettes, uncomplicated: Secondary | ICD-10-CM | POA: Diagnosis not present

## 2020-06-08 DIAGNOSIS — Y939 Activity, unspecified: Secondary | ICD-10-CM | POA: Insufficient documentation

## 2020-06-08 DIAGNOSIS — Z79899 Other long term (current) drug therapy: Secondary | ICD-10-CM | POA: Diagnosis not present

## 2020-06-08 DIAGNOSIS — S39012A Strain of muscle, fascia and tendon of lower back, initial encounter: Secondary | ICD-10-CM | POA: Insufficient documentation

## 2020-06-08 DIAGNOSIS — E119 Type 2 diabetes mellitus without complications: Secondary | ICD-10-CM | POA: Diagnosis not present

## 2020-06-08 DIAGNOSIS — J449 Chronic obstructive pulmonary disease, unspecified: Secondary | ICD-10-CM | POA: Insufficient documentation

## 2020-06-08 DIAGNOSIS — M545 Low back pain: Secondary | ICD-10-CM | POA: Diagnosis present

## 2020-06-08 DIAGNOSIS — I10 Essential (primary) hypertension: Secondary | ICD-10-CM | POA: Diagnosis not present

## 2020-06-08 MED ORDER — METHOCARBAMOL 500 MG PO TABS
500.0000 mg | ORAL_TABLET | Freq: Two times a day (BID) | ORAL | 0 refills | Status: DC
Start: 2020-06-08 — End: 2021-02-27

## 2020-06-08 MED ORDER — OXYCODONE-ACETAMINOPHEN 5-325 MG PO TABS
1.0000 | ORAL_TABLET | Freq: Once | ORAL | Status: AC
  Administered 2020-06-08: 1 via ORAL
  Filled 2020-06-08: qty 1

## 2020-06-08 MED ORDER — LIDOCAINE 5 % EX PTCH: 1.0000 | MEDICATED_PATCH | CUTANEOUS | 0 refills | Status: DC

## 2020-06-08 MED ORDER — METHOCARBAMOL 500 MG PO TABS
500.0000 mg | ORAL_TABLET | Freq: Once | ORAL | Status: AC
  Administered 2020-06-08: 500 mg via ORAL
  Filled 2020-06-08: qty 1

## 2020-06-08 NOTE — ED Notes (Signed)
Pt discharge instructions and prescriptions reviewed with the patient. The patient verbalized understanding of instructions. Pt discharged. 

## 2020-06-08 NOTE — ED Triage Notes (Signed)
Pt reports lower back pain that radiates to his legs. Pt ambulatory with cane

## 2020-06-08 NOTE — ED Provider Notes (Signed)
[facility] EMERGENCY DEPARTMENT Provider Note   CSN: [id number] Arrival date & time: 06/08/20  1102     History Chief Complaint  Patient presents with  . Back Pain    Darryl Hurley is a 59 y.o. male with a past medical history of COPD, diabetes, hypertension presenting to the ED with a chief complaint of back pain.  Reports 2-week history of intermittent left lower back pain.  States that this feels similar to pain he is experienced in the past and is concerned that he may have pulled a muscle.  He has not taken any medications to help with the pain.  Reports sharp shooting pain radiating down his leg as well.  He denies any injuries or falls, prior back surgeries, IV drug use, fever, dysuria, hematuria, numbness in arms or legs, loss of bladder or bowel function.  He remains ambulatory with a cane at baseline.  HPI     Past Medical History:  Diagnosis Date  . Alcohol abuse   . Angina pectoris   . Arthritis   . Asthma   . Chronic lower back pain   . COPD (chronic obstructive pulmonary disease) (HCC)    on home O2 2L  . Diabetes mellitus without complication (HCC)   . Exertional shortness of breath   . Gout   . Hypertension   . Seizures [facility])     Patient Active Problem List   Diagnosis Date Noted  . Polyarthralgia 11/19/2019  . Sepsis (HCC) 11/19/2019  . Septic arthritis (HCC) 11/19/2019  . 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 Hyponatremia 11/19/2019  . Alcohol abuse 11/19/2019  . Lactic acidosis 01/06/2016  . Lactic acid acidosis 01/06/2016  . Alcohol abuse with intoxication (HCC) 01/06/2016  . COPD with acute exacerbation (HCC) 02/02/2014  . HCAP (healthcare-associated pneumonia) 02/02/2014  . Increased anion gap metabolic acidosis 02/02/2014  . Cocaine abuse (HCC) 11/18/2013  . Fever 11/17/2013  . Anemia 11/17/2013  . Hypokalemia 11/17/2013  . Polyarthritis 11/17/2013  . Dehydration 11/17/2013  . Type 2 diabetes mellitus (HCC) 05/14/2013  .  Chronic obstructive pulmonary disease (COPD) (HCC) 05/12/2013  . Gout 05/12/2013  . Headache 05/12/2013  . Asthma 05/04/2013  . TOBACCO ABUSE 04/27/2009  . Essential hypertension 04/27/2009    Past Surgical History:  Procedure Laterality Date  . INCISION AND DRAINAGE ABSCESS Bilateral 11/19/2019   Procedure: INCISION AND DRAINAGE BILATERAL SEPTIC KNEES, ASPIRATION OF LEFT KNEE;  Surgeon: Roby Lofts, MD;  Location: [facility] OR;  Service: Orthopedics;  Laterality: Bilateral;  . NO PAST SURGERIES         Family History  Problem Relation Age of Onset  . Diabetes type II Mother   . Depression Father   . Suicidality Father   . Asthma Brother     Social History   Tobacco Use  . Smoking status: Current Every Day Smoker    Packs/day: 0.25    Years: 40.00    Pack years: 10.00    Types: Cigarettes  . Smokeless tobacco: Never Used  . Tobacco comment: 05/12/2013 'eased off smoking since last month"  Substance Use Topics  . Alcohol use: Yes    Comment: 4 40 oz beers per day, 1 pint of liqor in a day  . Drug use: No    Types: "Crack" cocaine    Home Medications Prior to Admission medications   Medication Sig Start Date End Date Taking? Authorizing Provider  acetaminophen (TYLENOL) 325 MG tablet Take 2 tablets (650 mg total)  by mouth every 6 (six) hours as needed. 05/20/19   Lamptey, Britta Mccreedy, MD  albuterol (PROVENTIL HFA;VENTOLIN HFA) 108 (90 BASE) MCG/ACT inhaler Inhale 1 puff into the lungs every 6 (six) hours as needed for wheezing or shortness of breath.    [provider]  allopurinol (ZYLOPRIM) 100 MG tablet Take 100 mg by mouth daily.    [provider]  amLODipine (NORVASC) 10 MG tablet Take 10 mg by mouth daily. 08/06/18   [provider]  Cholecalciferol (VITAMIN D-3) 125 MCG (5000 UT) TABS Take 5,000 Units by mouth daily. 11/23/19   Despina Hidden, PA-C  cloNIDine (CATAPRES) 0.1 MG tablet Take 0.1 mg by mouth 2 (two) times daily. 08/15/18    [provider]  diclofenac Sodium (VOLTAREN) 1 % GEL Apply 2 g topically 4 (four) times daily. 03/25/20   Couture, Cortni S, PA-C  FLOVENT HFA 110 MCG/ACT inhaler Inhale 1 puff into the lungs 2 (two) times daily as needed (shortness of breath).  08/06/18   [provider]  fluticasone (FLONASE) 50 MCG/ACT nasal spray Place 1 spray into both nostrils daily. 11/05/19   [provider]  folic acid (FOLVITE) 1 MG tablet Take 1 tablet (1 mg total) by mouth daily. 11/23/19   Glade Lloyd, MD  gabapentin (NEURONTIN) 300 MG capsule Take 300 mg by mouth 2 (two) times daily. 03/04/19   [provider]  HYDROcodone-acetaminophen (NORCO/VICODIN) 5-325 MG tablet Take 1 tablet by mouth every 6 (six) hours as needed for moderate pain. 11/23/19   Despina Hidden, PA-C  lidocaine (LIDODERM) 5 % Place 1 patch onto the skin daily. Remove & Discard patch within 12 hours or as directed by MD 06/08/20   Dietrich Pates, PA-C  lisinopril (ZESTRIL) 20 MG tablet Take 20 mg by mouth daily.    [provider]  meloxicam (MOBIC) 7.5 MG tablet Take 7.5 mg by mouth daily.    [provider]  methocarbamol (ROBAXIN) 500 MG tablet Take 1 tablet (500 mg total) by mouth 2 (two) times daily. 06/08/20   Leimomi Zervas, PA-C  Multiple Vitamin (MULTIVITAMIN WITH MINERALS) TABS tablet Take 1 tablet by mouth daily. 11/23/19   Glade Lloyd, MD  nitroGLYCERIN (NITROSTAT) 0.4 MG SL tablet Place 1 tablet (0.4 mg total) under the tongue every 5 (five) minutes x 3 doses as needed for chest pain. 03/18/19   Patwardhan, Anabel Bene, MD  rosuvastatin (CRESTOR) 40 MG tablet Take 40 mg by mouth daily.    [provider]  SYMBICORT 160-4.5 MCG/ACT inhaler Inhale 2 puffs into the lungs 2 (two) times daily. 11/05/19   [provider]  thiamine 100 MG tablet Take 1 tablet (100 mg total) by mouth daily. 11/23/19   Glade Lloyd, MD  tiZANidine (ZANAFLEX) 4 MG capsule Take 4 mg by mouth as needed  for muscle spasms.  06/25/18   [provider]  traZODone (DESYREL) 50 MG tablet Take 1 tablet (50 mg total) by mouth at bedtime as needed for sleep. 05/20/19   Lamptey, Britta Mccreedy, MD  labetalol (NORMODYNE) 200 MG tablet Take 1 tablet (200 mg total) by mouth 2 (two) times daily. Patient not taking: Reported on 03/25/2019 02/16/19 05/20/19  Elder Negus, MD    Allergies    Nsaids and Toradol [ketorolac tromethamine]  Review of Systems   Review of Systems  Constitutional: Negative for chills and fever.  Respiratory: Negative for shortness of breath.   Cardiovascular: Negative for leg swelling.  Genitourinary: Negative  for dysuria and hematuria.  Musculoskeletal: Positive for back pain and myalgias.  Neurological: Negative for weakness and numbness.    Physical Exam Updated Vital Signs BP (!) 169/92   Pulse 62   Temp 98.2 F (36.8 C) (Oral)   Resp 16   SpO2 99%   Physical Exam Vitals and nursing note reviewed.  Constitutional:      General: He is not in acute distress.    Appearance: He is well-developed. He is not diaphoretic.  HENT:     Head: Normocephalic and atraumatic.  Eyes:     General: No scleral icterus.    Conjunctiva/sclera: Conjunctivae normal.  Cardiovascular:     Rate and Rhythm: Normal rate and regular rhythm.     Heart sounds: Normal heart sounds.  Pulmonary:     Effort: Pulmonary effort is normal. No respiratory distress.  Musculoskeletal:     Cervical back: Normal range of motion.     Lumbar back: Tenderness present.       Back:     Comments: Tenderness to palpation of the left paraspinal musculature of the lumbar spine as noted in the image. No midline spinal tenderness present in lumbar, thoracic or cervical spine. No step-off palpated. No visible bruising, edema or temperature change noted. No objective signs of numbness present. No saddle anesthesia. 2+ DP pulses bilaterally. Sensation intact to light touch. Strength 5/5 in bilateral lower  extremities.  Skin:    Findings: No rash.  Neurological:     Mental Status: He is alert.     ED Results / Procedures / Treatments   Labs (all labs ordered are listed, but only abnormal results are displayed) Labs Reviewed - No data to display  EKG None  Radiology No results found.  Procedures Procedures (including critical care time)  Medications Ordered in ED Medications  oxyCODONE-acetaminophen (PERCOCET/ROXICET) 5-325 MG per tablet 1 tablet (1 tablet Oral Given 06/08/20 1255)  methocarbamol (ROBAXIN) tablet 500 mg (500 mg Oral Given 06/08/20 1256)    ED Course  I have reviewed the triage vital signs and the nursing notes.  Pertinent labs & imaging results that were available during my care of the patient were reviewed by me and considered in my medical decision making (see chart for details).    MDM Rules/Calculators/A&P                          Patient denies any concerning symptoms suggestive of cauda equina requiring urgent imaging at this time such as loss of sensation in the lower extremities, lower extremity weakness, loss of bowel or bladder control, saddle anesthesia, urinary retention, fever/chills, IVDU. Exam demonstrated no  weakness on exam today. No preceding injury or trauma to suggest acute fracture. Doubt pelvic or urinary pathology for patient's acute back pain, as patient denies urinary symptoms. Doubt AAA as cause of patient's back pain as patient lacks major risk factors, had no abdominal TTP, and has symmetric and intact distal pulses.  Suspect that symptoms could be due to a muscle strain as he is concerned he has had similar pain in the past and he may have done some heavy lifting before symptoms began.  He remains ambulatory here.  He is afebrile without recent use of antipyretics.  Will treat with short course of pain medication, muscle relaxer and PCP follow-up.  Patient given strict return precautions for any symptoms indicating worsening neurologic  function in the lower extremities.   Patient is hemodynamically stable,  in NAD, and able to ambulate in the ED. Evaluation does not show pathology that would require ongoing emergent intervention or inpatient treatment. I explained the diagnosis to the patient. Pain has been managed and has no complaints prior to discharge. Patient is comfortable with above plan and is stable for discharge at this time. All questions were answered prior to disposition. Strict return precautions for returning to the ED were discussed. Encouraged follow up with PCP.   An After Visit Summary was printed and given to the patient.   Portions of this note were generated with Scientist, clinical (histocompatibility and immunogenetics). Dictation errors may occur despite best attempts at proofreading.  Final Clinical Impression(s) / ED Diagnoses Final diagnoses:  Strain of lumbar region, initial encounter    Rx / DC Orders ED Discharge Orders         Ordered    methocarbamol (ROBAXIN) 500 MG tablet  2 times daily     Discontinue     06/08/20 1314    lidocaine (LIDODERM) 5 %  Every 24 hours     Discontinue     06/08/20 1314           Dietrich Pates, PA-C 06/08/20 1314    Geoffery Lyons, MD 06/08/20 1539

## 2020-06-08 NOTE — Discharge Instructions (Signed)
Take medications to help with your symptoms. Follow-up with your primary care provider. You can apply heat, stretch and massage the area to help with symptoms as well. Return to the ER for any worsening pain, injuries or falls, losing control of your bowels or bladder, fever.

## 2020-06-10 ENCOUNTER — Emergency Department (HOSPITAL_COMMUNITY)
Admission: EM | Admit: 2020-06-10 | Discharge: 2020-06-10 | Disposition: A | Discharge status: Home / Self Care | Payer: Medicaid Other | Attending: Emergency Medicine | Admitting: Emergency Medicine

## 2020-06-10 ENCOUNTER — Emergency Department (HOSPITAL_COMMUNITY): Payer: Medicaid Other

## 2020-06-10 ENCOUNTER — Other Ambulatory Visit: Payer: Self-pay

## 2020-06-10 ENCOUNTER — Encounter (HOSPITAL_COMMUNITY): Payer: Self-pay

## 2020-06-10 DIAGNOSIS — R102 Pelvic and perineal pain: Secondary | ICD-10-CM | POA: Diagnosis present

## 2020-06-10 DIAGNOSIS — Z79899 Other long term (current) drug therapy: Secondary | ICD-10-CM | POA: Insufficient documentation

## 2020-06-10 DIAGNOSIS — I10 Essential (primary) hypertension: Secondary | ICD-10-CM | POA: Insufficient documentation

## 2020-06-10 DIAGNOSIS — J441 Chronic obstructive pulmonary disease with (acute) exacerbation: Secondary | ICD-10-CM | POA: Diagnosis not present

## 2020-06-10 DIAGNOSIS — E119 Type 2 diabetes mellitus without complications: Secondary | ICD-10-CM | POA: Insufficient documentation

## 2020-06-10 DIAGNOSIS — J45909 Unspecified asthma, uncomplicated: Secondary | ICD-10-CM | POA: Insufficient documentation

## 2020-06-10 DIAGNOSIS — K4020 Bilateral inguinal hernia, without obstruction or gangrene, not specified as recurrent: Secondary | ICD-10-CM | POA: Diagnosis not present

## 2020-06-10 DIAGNOSIS — F17210 Nicotine dependence, cigarettes, uncomplicated: Secondary | ICD-10-CM | POA: Insufficient documentation

## 2020-06-10 HISTORY — DX: Anemia, unspecified: D64.9

## 2020-06-10 LAB — URINALYSIS, ROUTINE W REFLEX MICROSCOPIC
Bacteria, UA: NONE SEEN
Bilirubin Urine: NEGATIVE
Glucose, UA: NEGATIVE mg/dL
Ketones, ur: NEGATIVE mg/dL
Leukocytes,Ua: NEGATIVE
Nitrite: NEGATIVE
Protein, ur: 100 mg/dL — AB
Specific Gravity, Urine: >1.046 — ABNORMAL HIGH (ref 1.005–1.030)
pH: 5 (ref 5.0–8.0)

## 2020-06-10 LAB — COMPREHENSIVE METABOLIC PANEL
ALT: 14 U/L (ref 0–44)
AST: 20 U/L (ref 15–41)
Albumin: 3.9 g/dL (ref 3.5–5.0)
Alkaline Phosphatase: 59 U/L (ref 38–126)
Anion gap: 10 (ref 5–15)
BUN: 23 mg/dL — ABNORMAL HIGH (ref 6–20)
CO2: 24 mmol/L (ref 22–32)
Calcium: 9.5 mg/dL (ref 8.9–10.3)
Chloride: 104 mmol/L (ref 98–111)
Creatinine, Ser: 1.44 mg/dL — ABNORMAL HIGH (ref 0.61–1.24)
GFR calc Af Amer: >60 mL/min (ref >60)
GFR calc non Af Amer: 54 mL/min — ABNORMAL LOW (ref >60)
Glucose, Bld: 112 mg/dL — ABNORMAL HIGH (ref 70–99)
Potassium: 3.9 mmol/L (ref 3.5–5.1)
Sodium: 138 mmol/L (ref 135–145)
Total Bilirubin: 1.2 mg/dL (ref 0.3–1.2)
Total Protein: 8.1 g/dL (ref 6.5–8.1)

## 2020-06-10 LAB — LIPASE, BLOOD: Lipase: 41 U/L (ref 11–51)

## 2020-06-10 LAB — CBC
HCT: 43.8 % (ref 39.0–52.0)
Hemoglobin: 14.4 g/dL (ref 13.0–17.0)
MCH: 29.1 pg (ref 26.0–34.0)
MCHC: 32.9 g/dL (ref 30.0–36.0)
MCV: 88.5 fL (ref 80.0–100.0)
Platelets: 362 10*3/uL (ref 150–400)
RBC: 4.95 MIL/uL (ref 4.22–5.81)
RDW: 15.6 % — ABNORMAL HIGH (ref 11.5–15.5)
WBC: 10 10*3/uL (ref 4.0–10.5)
nRBC: 0 % (ref 0.0–0.2)

## 2020-06-10 IMAGING — CT CT ABD-PELV W/ CM
2 of 5 series · 16 of 46 positions shown, 18 images · IV contrast (omnipaque)
Comparison: [DATE]

CLINICAL DATA: Left lower quadrant abdominal pain.

EXAM:
CT ABDOMEN AND PELVIS WITH CONTRAST
TECHNIQUE: Multidetector CT imaging of the abdomen and pelvis was performed
using the standard protocol following bolus administration of
intravenous contrast.
CONTRAST:  100mL OMNIPAQUE IOHEXOL 300 MG/ML  SOLN

[Series 2: axial st · axial · 0.76mm/px · z∈[-443,-38]mm · 13 of 93 slices shown, 15 images]
[im 6/93  soft-tissue]
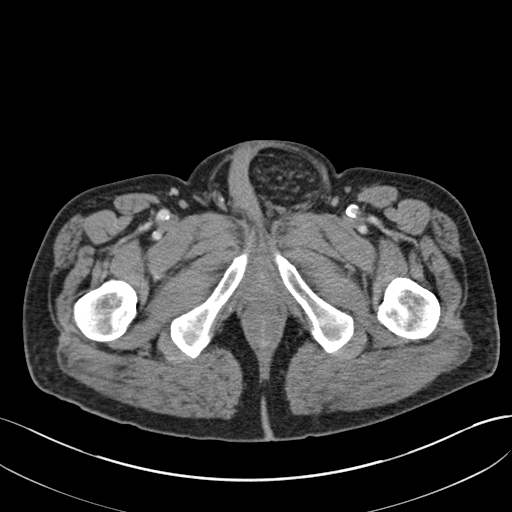
[im 6/93  bone]
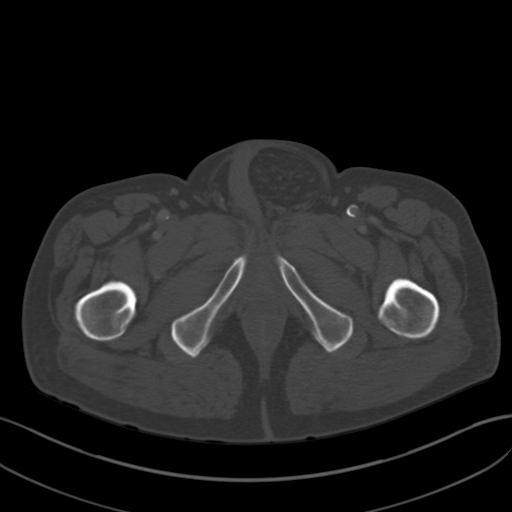
[im 12/93  soft-tissue]
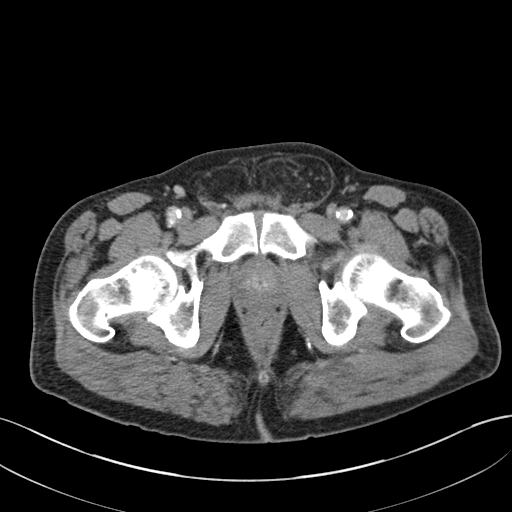
[im 18/93  soft-tissue]
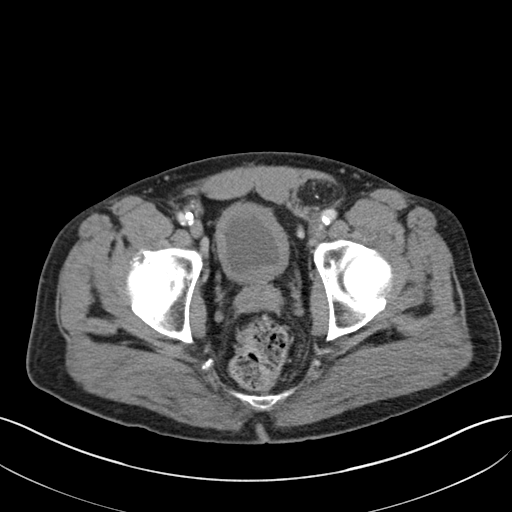
[im 29/93  soft-tissue]
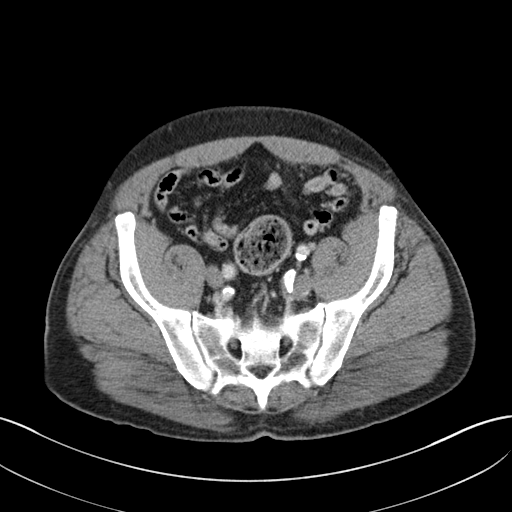
[im 35/93  soft-tissue]
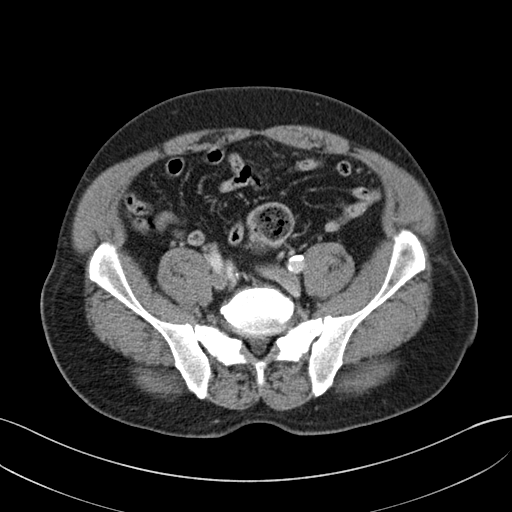
[im 41/93  soft-tissue]
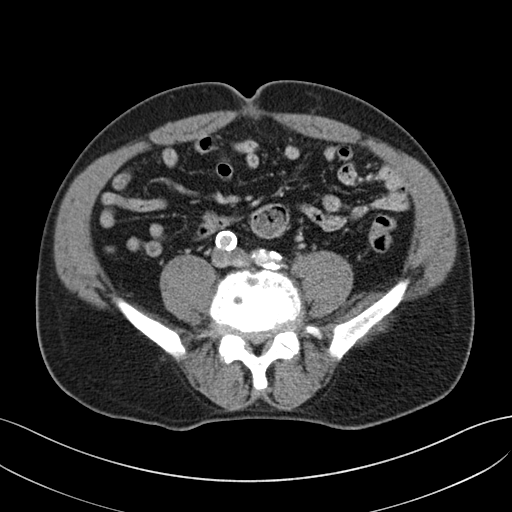
[im 47/93  soft-tissue]
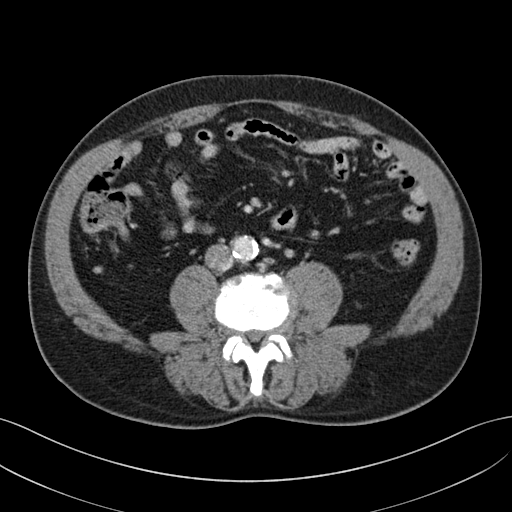
[im 52/93  soft-tissue]
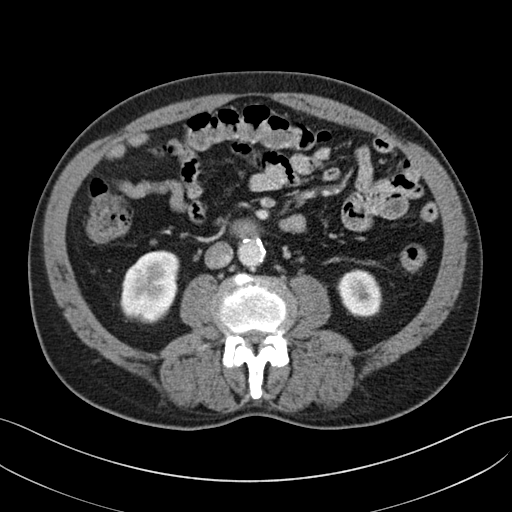
[im 58/93  soft-tissue]
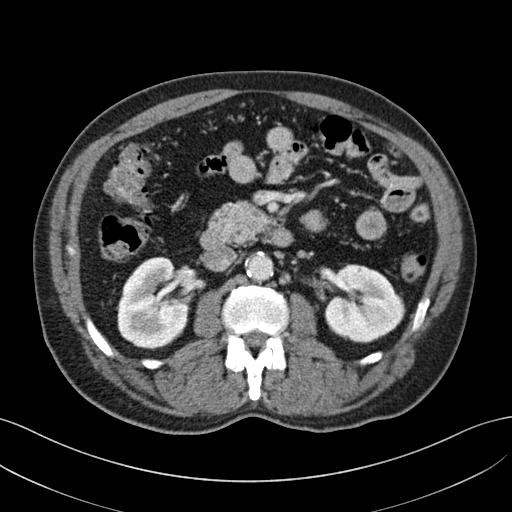
[im 58/93  bone]
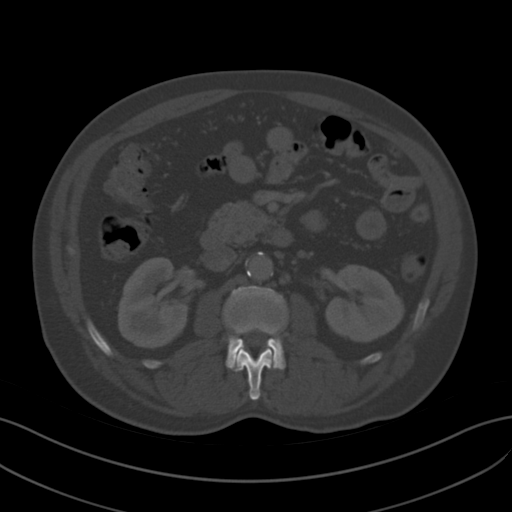
[im 64/93  soft-tissue]
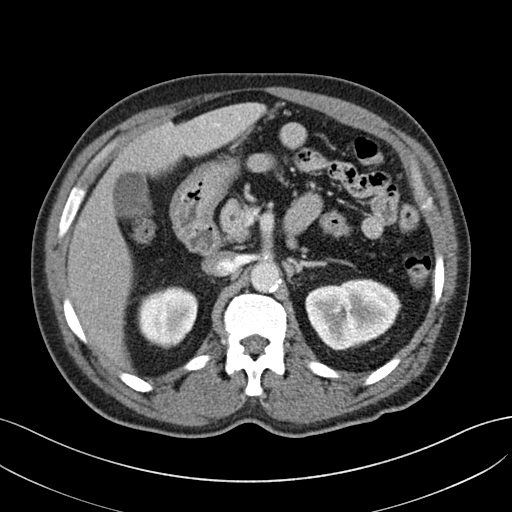
[im 75/93  soft-tissue]
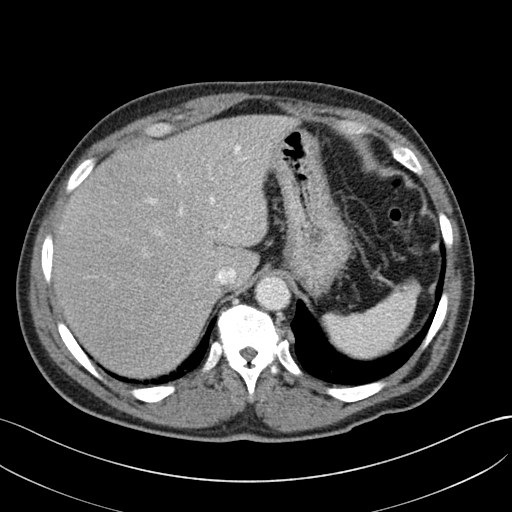
[im 81/93  soft-tissue]
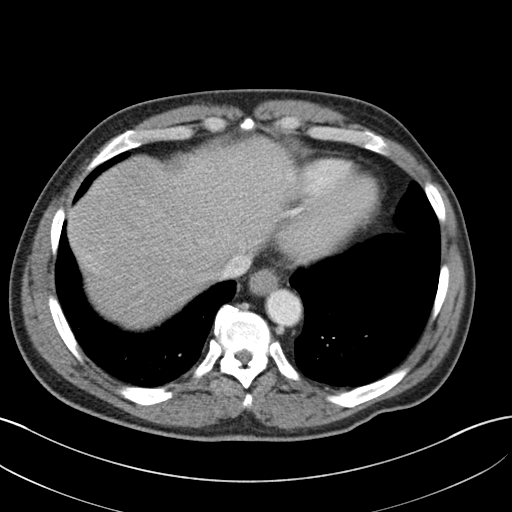
[im 87/93  soft-tissue]
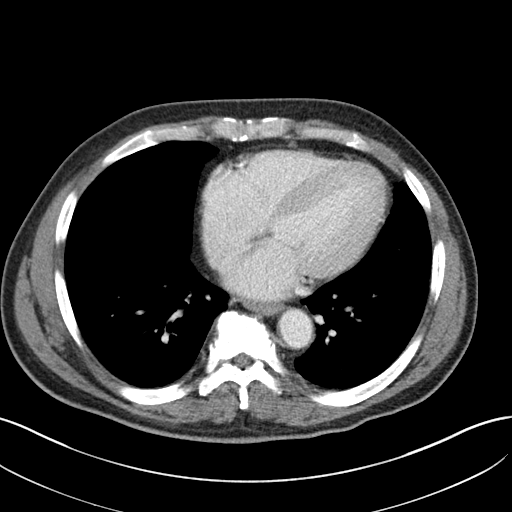

[Series 5: coronal st · coronal · 0.74mm/px · 3 of 139 slices shown]
[im 47/139  soft-tissue]
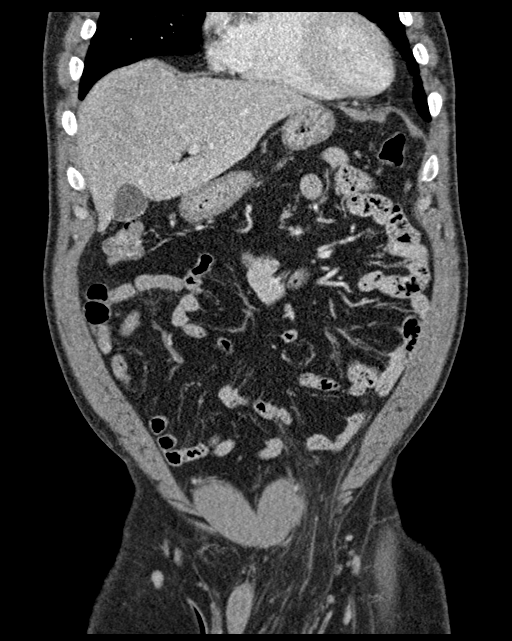
[im 62/139  soft-tissue]
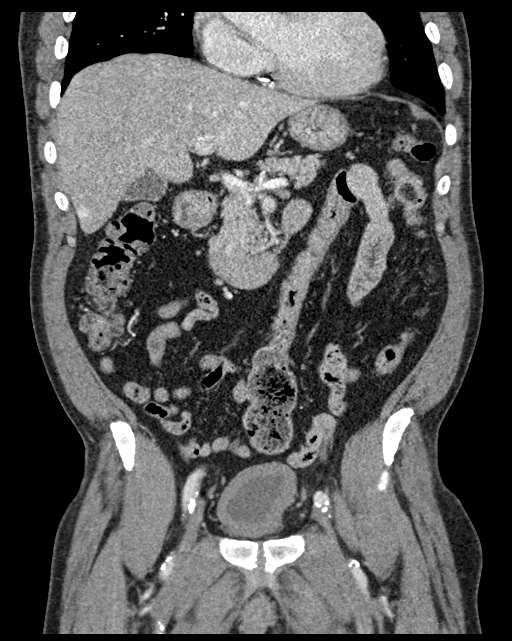
[im 77/139  soft-tissue]
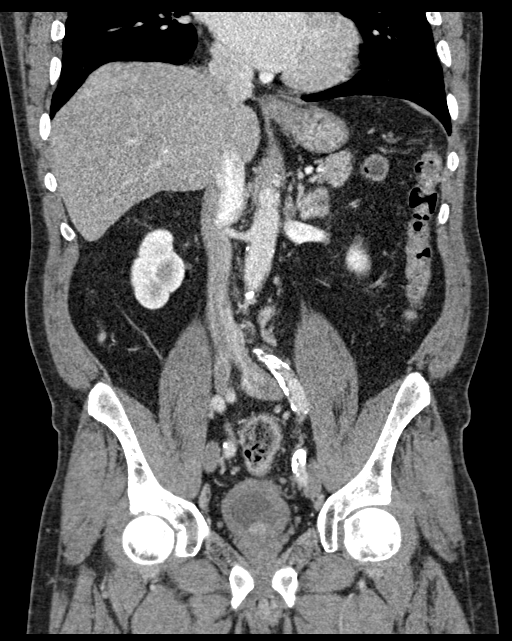

[16 of 46 positions shown; findings below may reference images not displayed]

FINDINGS: Lower chest: The visualized lung bases are clear bilaterally.
Moderate coronary artery calcification. Global cardiac size within
normal limits.

Hepatobiliary: Moderate hepatic steatosis. Multiple enhancing
lesions are again identified within the right hepatic lobe measuring
up to 12 mm which appears stable since prior examination and likely
represent several small flash filled hemangioma. No intrahepatic
biliary ductal dilation. Gallbladder unremarkable

Pancreas: Unremarkable

Spleen: An 11 mm hypodense lesion is seen within the inferior pole
of the spleen, nonspecific. This may represent a small hemangioma or
complex cyst. The spleen is otherwise unremarkable.

Adrenals/Urinary Tract: The adrenal glands and kidneys are
unremarkable. There is circumferential bladder wall thickening,
similar to that noted on a multiple prior examination suggesting
changes of chronic bladder outlet obstruction. The bladder is not
distended, however.

Stomach/Bowel: Unremarkable.  No free intraperitoneal fluid

Vascular/Lymphatic: No pathologic adenopathy within the abdomen and
pelvis. There is extensive aortoiliac atherosclerotic calcification
similar to that noted on prior examinations. No aneurysm or
hemodynamically significant stenosis identified.

Reproductive: Mild central prostatic hypertrophy.

Other: Large left indirect inguinal hernia is present with
mesenteric fat herniating into the scrotum beyond the margin of the
examination. Moderate right fat containing direct inguinal hernia is
also identified.

Musculoskeletal: The osseous structures are age-appropriate with
advanced degenerative changes noted within the lumbar spine.
IMPRESSION: Stable examination with large left inguinal hernia with herniation
of mesenteric fat into the scrotum beyond the margin of the
examination. Moderate right inguinal hernia also identified.

Circumferential bladder wall thickening in keeping with changes of
chronic bladder outlet obstruction. The bladder is not distended.

## 2020-06-10 MED ORDER — SODIUM CHLORIDE 0.9% FLUSH
3.0000 mL | Freq: Once | INTRAVENOUS | Status: AC
  Administered 2020-06-10: 3 mL via INTRAVENOUS

## 2020-06-10 MED ORDER — HYDROCODONE-ACETAMINOPHEN 5-325 MG PO TABS: 1.0000 | ORAL_TABLET | Freq: Four times a day (QID) | ORAL | 0 refills | Status: DC | PRN

## 2020-06-10 MED ORDER — SODIUM CHLORIDE (PF) 0.9 % IJ SOLN
INTRAMUSCULAR | Status: AC
  Filled 2020-06-10: qty 50

## 2020-06-10 MED ORDER — IOHEXOL 300 MG/ML  SOLN
100.0000 mL | Freq: Once | INTRAMUSCULAR | Status: AC | PRN
  Administered 2020-06-10: 100 mL via INTRAVENOUS

## 2020-06-10 NOTE — ED Provider Notes (Signed)
[facility]-EMERGENCY DEPT Provider Note   CSN: [id number] Arrival date & time: 06/10/20  1023     History Chief Complaint  Patient presents with  . Abdominal Pain    Darryl Hurley is a 59 y.o. male.  HPI Patient is a 59 year old male with medical history as noted below.  He states that he has been experiencing 2 to 3 months of pain in the suprapubic region and left inguinal region.  This began to worsen last night.  He went to see his PCP this morning who recommended that he come to the emergency department for reevaluation.  Per records, patient had a CT scan of the abdomen and pelvis on April 23 of this year.  No acute findings were found.  Multiple hypervascular lesions in the liver.  Bilateral groin hernias, left greater than right.  Prostatomegaly with heterogeneous enhancement.  Patient states his pain worsens with movement and palpation.  Patient was given IM Toradol en route.  He notes moderate relief.  He denies fevers, chills, back pain, numbness, tingling, nausea, vomiting, diarrhea, chest pain, shortness of breath.   Past Medical History:  Diagnosis Date  . Alcohol abuse   . Anemia   . Angina pectoris   . Arthritis   . Asthma   . Chronic lower back pain   . COPD (chronic obstructive pulmonary disease) (HCC)    on home O2 2L  . Diabetes mellitus without complication (HCC)   . Exertional shortness of breath   . Gout   . Hypertension   . Seizures [facility])     Patient Active Problem List   Diagnosis Date Noted  . Polyarthralgia 11/19/2019  . Sepsis (HCC) 11/19/2019  . Septic arthritis (HCC) 11/19/2019  . 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 Hyponatremia 11/19/2019  . Alcohol abuse 11/19/2019  . Lactic acidosis 01/06/2016  . Lactic acid acidosis 01/06/2016  . Alcohol abuse with intoxication (HCC) 01/06/2016  . COPD with acute exacerbation (HCC) 02/02/2014  . HCAP (healthcare-associated pneumonia) 02/02/2014  . Increased anion gap metabolic  acidosis 02/02/2014  . Cocaine abuse (HCC) 11/18/2013  . Fever 11/17/2013  . Anemia 11/17/2013  . Hypokalemia 11/17/2013  . Polyarthritis 11/17/2013  . Dehydration 11/17/2013  . Type 2 diabetes mellitus (HCC) 05/14/2013  . Chronic obstructive pulmonary disease (COPD) (HCC) 05/12/2013  . Gout 05/12/2013  . Headache 05/12/2013  . Asthma 05/04/2013  . TOBACCO ABUSE 04/27/2009  . Essential hypertension 04/27/2009    Past Surgical History:  Procedure Laterality Date  . INCISION AND DRAINAGE ABSCESS Bilateral 11/19/2019   Procedure: INCISION AND DRAINAGE BILATERAL SEPTIC KNEES, ASPIRATION OF LEFT KNEE;  Surgeon: Roby Lofts, MD;  Location: [facility] OR;  Service: Orthopedics;  Laterality: Bilateral;  . NO PAST SURGERIES         Family History  Problem Relation Age of Onset  . Diabetes type II Mother   . Depression Father   . Suicidality Father   . Asthma Brother     Social History   Tobacco Use  . Smoking status: Current Every Day Smoker    Packs/day: 0.50    Years: 40.00    Pack years: 20.00    Types: Cigarettes  . Smokeless tobacco: Never Used  . Tobacco comment: 05/12/2013 'eased off smoking since last month"  Vaping Use  . Vaping Use: Never used  Substance Use Topics  . Alcohol use: Not Currently  . Drug use: No    Types: "Crack" cocaine    Home Medications Prior  to Admission medications   Medication Sig Start Date End Date Taking? Authorizing Provider  acetaminophen (TYLENOL) 325 MG tablet Take 2 tablets (650 mg total) by mouth every 6 (six) hours as needed. 05/20/19   Lamptey, Britta Mccreedy, MD  albuterol (PROVENTIL HFA;VENTOLIN HFA) 108 (90 BASE) MCG/ACT inhaler Inhale 1 puff into the lungs every 6 (six) hours as needed for wheezing or shortness of breath.    [provider]  allopurinol (ZYLOPRIM) 100 MG tablet Take 100 mg by mouth daily.    [provider]  amLODipine (NORVASC) 10 MG tablet Take 10 mg by mouth daily. 08/06/18   [provider]  Cholecalciferol (VITAMIN D-3) 125 MCG (5000 UT) TABS Take 5,000 Units by mouth daily. 11/23/19   Despina Hidden, PA-C  cloNIDine (CATAPRES) 0.1 MG tablet Take 0.1 mg by mouth 2 (two) times daily. 08/15/18   [provider]  diclofenac Sodium (VOLTAREN) 1 % GEL Apply 2 g topically 4 (four) times daily. 03/25/20   Couture, Cortni S, PA-C  FLOVENT HFA 110 MCG/ACT inhaler Inhale 1 puff into the lungs 2 (two) times daily as needed (shortness of breath).  08/06/18   [provider]  fluticasone (FLONASE) 50 MCG/ACT nasal spray Place 1 spray into both nostrils daily. 11/05/19   [provider]  folic acid (FOLVITE) 1 MG tablet Take 1 tablet (1 mg total) by mouth daily. 11/23/19   Glade Lloyd, MD  gabapentin (NEURONTIN) 300 MG capsule Take 300 mg by mouth 2 (two) times daily. 03/04/19   [provider]  HYDROcodone-acetaminophen (NORCO/VICODIN) 5-325 MG tablet Take 1 tablet by mouth every 6 (six) hours as needed for moderate pain. 11/23/19   Despina Hidden, PA-C  lidocaine (LIDODERM) 5 % Place 1 patch onto the skin daily. Remove & Discard patch within 12 hours or as directed by MD 06/08/20   Dietrich Pates, PA-C  lisinopril (ZESTRIL) 20 MG tablet Take 20 mg by mouth daily.    [provider]  meloxicam (MOBIC) 7.5 MG tablet Take 7.5 mg by mouth daily.    [provider]  methocarbamol (ROBAXIN) 500 MG tablet Take 1 tablet (500 mg total) by mouth 2 (two) times daily. 06/08/20   Khatri, Hina, PA-C  Multiple Vitamin (MULTIVITAMIN WITH MINERALS) TABS tablet Take 1 tablet by mouth daily. 11/23/19   Glade Lloyd, MD  nitroGLYCERIN (NITROSTAT) 0.4 MG SL tablet Place 1 tablet (0.4 mg total) under the tongue every 5 (five) minutes x 3 doses as needed for chest pain. 03/18/19   Patwardhan, Anabel Bene, MD  rosuvastatin (CRESTOR) 40 MG tablet Take 40 mg by mouth daily.    [provider]  SYMBICORT 160-4.5 MCG/ACT inhaler Inhale 2 puffs into the lungs 2 (two)  times daily. 11/05/19   [provider]  thiamine 100 MG tablet Take 1 tablet (100 mg total) by mouth daily. 11/23/19   Glade Lloyd, MD  tiZANidine (ZANAFLEX) 4 MG capsule Take 4 mg by mouth as needed for muscle spasms.  06/25/18   [provider]  traZODone (DESYREL) 50 MG tablet Take 1 tablet (50 mg total) by mouth at bedtime as needed for sleep. 05/20/19   Lamptey, Britta Mccreedy, MD  labetalol (NORMODYNE) 200 MG tablet Take 1 tablet (200 mg total) by mouth 2 (two) times daily. Patient not taking: Reported on 03/25/2019 02/16/19 05/20/19  Elder Negus, MD    Allergies    Nsaids and Toradol [ketorolac tromethamine]  Review of Systems   Review  of Systems  All other systems reviewed and are negative. Ten systems reviewed and are negative for acute change, except as noted in the HPI.   Physical Exam Updated Vital Signs BP (!) 178/101 (BP Location: Left Arm)   Pulse 72   Temp 97.8 F (36.6 C) (Oral)   Resp 16   Ht  (1.651 m)   Wt 90.7 kg   SpO2 98%   BMI 33.28 kg/m   Physical Exam Vitals and nursing note reviewed.  Constitutional:      General: He is not in acute distress.    Appearance: Normal appearance. He is not ill-appearing, toxic-appearing or diaphoretic.  HENT:     Head: Normocephalic and atraumatic.     Right Ear: External ear normal.     Left Ear: External ear normal.     Nose: Nose normal.     Mouth/Throat:     Mouth: Mucous membranes are moist.     Pharynx: Oropharynx is clear. No oropharyngeal exudate or posterior oropharyngeal erythema.  Eyes:     Extraocular Movements: Extraocular movements intact.  Cardiovascular:     Rate and Rhythm: Normal rate and regular rhythm.     Pulses: Normal pulses.     Heart sounds: Normal heart sounds. No murmur heard.  No friction rub. No gallop.   Pulmonary:     Effort: Pulmonary effort is normal. No respiratory distress.     Breath sounds: Normal breath sounds. No stridor. No wheezing, rhonchi or  rales.  Abdominal:     General: Abdomen is flat and protuberant.     Palpations: Abdomen is soft.     Tenderness: There is abdominal tenderness in the suprapubic area and left lower quadrant. There is no right CVA tenderness, left CVA tenderness, guarding or rebound. Negative signs include Murphy's sign and McBurney's sign.     Hernia: A hernia is present. Hernia is present in the left inguinal area (Mild TTP noted in the left inguinal region) and right inguinal area.  Musculoskeletal:        General: Normal range of motion.     Cervical back: Normal range of motion and neck supple. No tenderness.  Skin:    General: Skin is warm and dry.  Neurological:     General: No focal deficit present.     Mental Status: He is alert and oriented to person, place, and time.  Psychiatric:        Mood and Affect: Mood normal.        Behavior: Behavior normal.    ED Results / Procedures / Treatments   Labs (all labs ordered are listed, but only abnormal results are displayed) Labs Reviewed  COMPREHENSIVE METABOLIC PANEL - Abnormal; Notable for the following components:      Result Value   Glucose, Bld 112 (*)    BUN 23 (*)    Creatinine, Ser 1.44 (*)    GFR calc non Af Amer 54 (*)    All other components within normal limits  CBC - Abnormal; Notable for the following components:   RDW 15.6 (*)    All other components within normal limits  URINALYSIS, ROUTINE W REFLEX MICROSCOPIC - Abnormal; Notable for the following components:   Specific Gravity, Urine >1.046 (*)    Hgb urine dipstick SMALL (*)    Protein, ur 100 (*)    All other components within normal limits  LIPASE, BLOOD   EKG None  Radiology CT ABDOMEN PELVIS W CONTRAST  Result Date:  06/10/2020 CLINICAL DATA:  Left lower quadrant abdominal pain. EXAM: CT ABDOMEN AND PELVIS WITH CONTRAST TECHNIQUE: Multidetector CT imaging of the abdomen and pelvis was performed using the standard protocol following bolus administration of  intravenous contrast. CONTRAST:  OMNIPAQUE IOHEXOL 300 MG/ML  SOLN COMPARISON:  03/25/2020 FINDINGS: Lower chest: The visualized lung bases are clear bilaterally. Moderate coronary artery calcification. Global cardiac size within normal limits. Hepatobiliary: Moderate hepatic steatosis. Multiple enhancing lesions are again identified within the right hepatic lobe measuring up to 12 mm which appears stable since prior examination and likely represent several small flash filled hemangioma. No intrahepatic biliary ductal dilation. Gallbladder unremarkable Pancreas: Unremarkable Spleen: An 11 mm hypodense lesion is seen within the inferior pole of the spleen, nonspecific. This may represent a small hemangioma or complex cyst. The spleen is otherwise unremarkable. Adrenals/Urinary Tract: The adrenal glands and kidneys are unremarkable. There is circumferential bladder wall thickening, similar to that noted on a multiple prior examination suggesting changes of chronic bladder outlet obstruction. The bladder is not distended, however. Stomach/Bowel: Unremarkable.  No free intraperitoneal fluid Vascular/Lymphatic: No pathologic adenopathy within the abdomen and pelvis. There is extensive aortoiliac atherosclerotic calcification similar to that noted on prior examinations. No aneurysm or hemodynamically significant stenosis identified. Reproductive: Mild central prostatic hypertrophy. Other: Large left indirect inguinal hernia is present with mesenteric fat herniating into the scrotum beyond the margin of the examination. Moderate right fat containing direct inguinal hernia is also identified. Musculoskeletal: The osseous structures are age-appropriate with advanced degenerative changes noted within the lumbar spine. IMPRESSION: Stable examination with large left inguinal hernia with herniation of mesenteric fat into the scrotum beyond the margin of the examination. Moderate right inguinal hernia also identified.  Circumferential bladder wall thickening in keeping with changes of chronic bladder outlet obstruction. The bladder is not distended. Electronically Signed   By: Helyn Numbers MD   On: 06/10/2020 12:39   Procedures Procedures (including critical care time)  Medications Ordered in ED Medications  sodium chloride (PF) 0.9 % injection (has no administration in time range)  sodium chloride flush (NS) 0.9 % injection 3 mL (3 mLs Intravenous Given 06/10/20 1111)  iohexol (OMNIPAQUE) 300 MG/ML solution 100 mL (100 mLs Intravenous Contrast Given 06/10/20 1151)   ED Course  I have reviewed the triage vital signs and the nursing notes.  Pertinent labs & imaging results that were available during my care of the patient were reviewed by me and considered in my medical decision making (see chart for details).  Clinical Course as of Jun 11 1447  Fri Jun 10, 2020  1311 Stable exam with a large left inguinal hernia with herniation of mesenteric fat to the scrotum beyond the margin of the exam.  Moderate right inguinal hernia also identified.  Circumferential bladder wall thickening in keeping with changes of chronic bladder outlet obstruction.  The bladder is not distended.I discussed this with the patient.  He denies any dysuria or difficulty urinating.  CT ABDOMEN PELVIS W CONTRAST [LJ]  1428 Slightly elevated compared to prior  Creatinine(!): 1.44 [LJ]  1443 Hgb urine dipstick(!): SMALL [LJ]  1443 Protein(!): 100 [LJ]    Clinical Course User Index [LJ] Placido Sou, PA-C   MDM Rules/Calculators/A&P                          Pt is a 59 y.o. male that present with a history, physical exam, ED Clinical Course as noted above.  Patient was discussed with general surgery as well as my attending physician Dr. Benjiman Core.  Reduction was attempted but unsuccessful.  Patient is having some moderate pain in the inguinal region but no erythema.  No swelling in the scrotum.  No leukocytosis today.   Afebrile.  Not tachycardic.  General surgery recommends that he follow-up outpatient next week.  He has been set up with Dr. Magnus Ivan for a follow-up appointment in 5 days.  We will give a short course of Norco.  Discussed safety regarding this medication.  His questions were answered and he was amicable at the time of discharge.  His vital signs are stable.  Patient discharged to home/self care.  Condition at discharge: Stable  Note: Portions of this report may have been transcribed using voice recognition software. Every effort was made to ensure accuracy; however, inadvertent computerized transcription errors may be present.   Final Clinical Impression(s) / ED Diagnoses Final diagnoses:  Bilateral inguinal hernia without obstruction or gangrene, recurrence not specified    Rx / DC Orders ED Discharge Orders         Ordered    HYDROcodone-acetaminophen (NORCO/VICODIN) 5-325 MG tablet  Every 6 hours PRN     Discontinue  Reprint     06/10/20 1445           Placido Sou, PA-C 06/10/20 1449    Benjiman Core, MD 06/10/20 1542

## 2020-06-10 NOTE — Discharge Instructions (Addendum)
You have an appointment with Dr. Magnus Ivan to discuss your hernia next Wednesday.  Please see instructions as well as his contact information below.  I am prescribing you a short course of Norco.  This is a strong narcotic medication.  It can cause constipation, so please stay adequately hydrated and eat fiber rich foods.  You might want to consider taking a stool softener.  You can always return to the emergency department with worsening symptoms.  It was a pleasure to meet you.

## 2020-06-10 NOTE — ED Triage Notes (Signed)
Per EMS- patient was picked up from a clinic. Patient c/o LLQ pain. Patient was given Toradol 60 mg IM prior to EMS arrival. Patient was diagnosed with an Inguinal hernia.

## 2020-06-10 NOTE — ED Notes (Signed)
Transported to CT at this time. 

## 2020-06-10 NOTE — ED Notes (Signed)
ED Provider at bedside. 

## 2020-07-18 ENCOUNTER — Other Ambulatory Visit: Payer: Self-pay | Admitting: Family Medicine

## 2020-07-18 ENCOUNTER — Other Ambulatory Visit: Payer: Self-pay

## 2020-07-18 ENCOUNTER — Ambulatory Visit
Admission: RE | Admit: 2020-07-18 | Discharge: 2020-07-18 | Disposition: A | Discharge status: Home / Self Care | Payer: Medicaid Other | Source: Ambulatory Visit | Attending: Family Medicine | Admitting: Family Medicine

## 2020-07-18 DIAGNOSIS — M545 Low back pain, unspecified: Secondary | ICD-10-CM

## 2020-07-18 IMAGING — DX DG LUMBAR SPINE COMPLETE 4+V
5 series · 5 of 5 positions shown · non-contrast
Comparison: CT abdomen pelvis dated [DATE].

CLINICAL DATA: Low back pain with radiculopathy.

EXAM:
LUMBAR SPINE - COMPLETE 4+ VIEW

[dg lumbar spine complete 4 +v (1 of 5)]
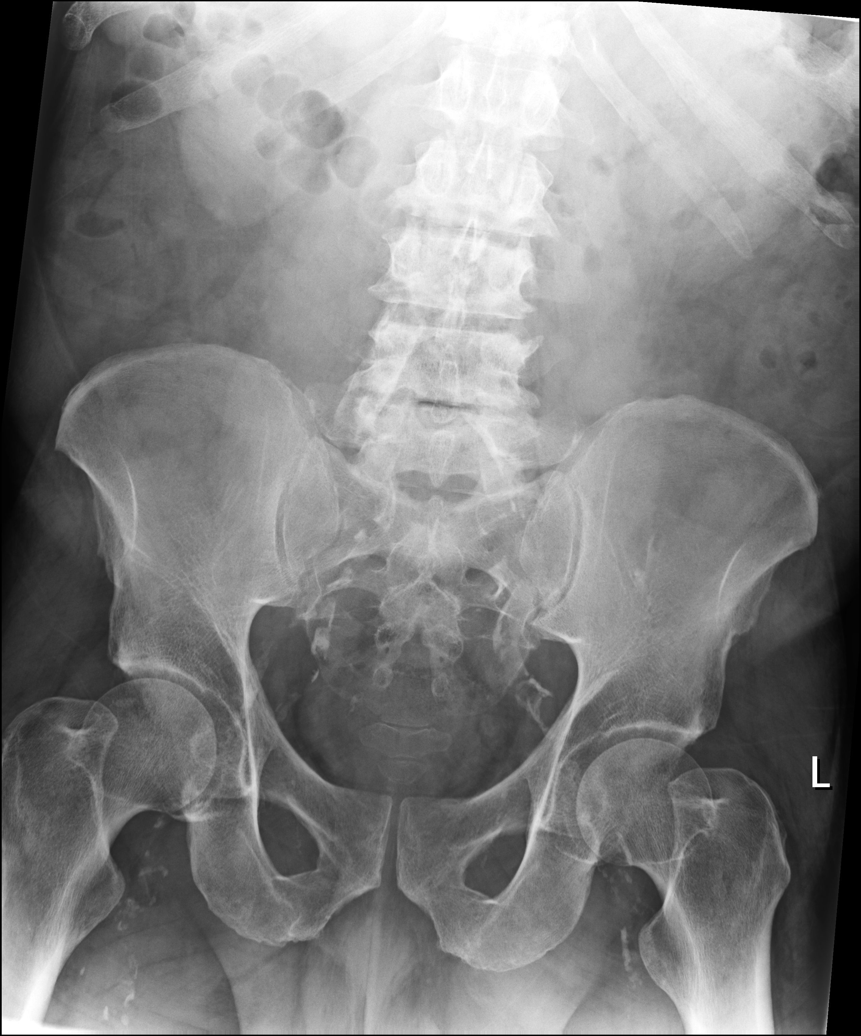

[dg lumbar spine complete 4 +v (2 of 5)]
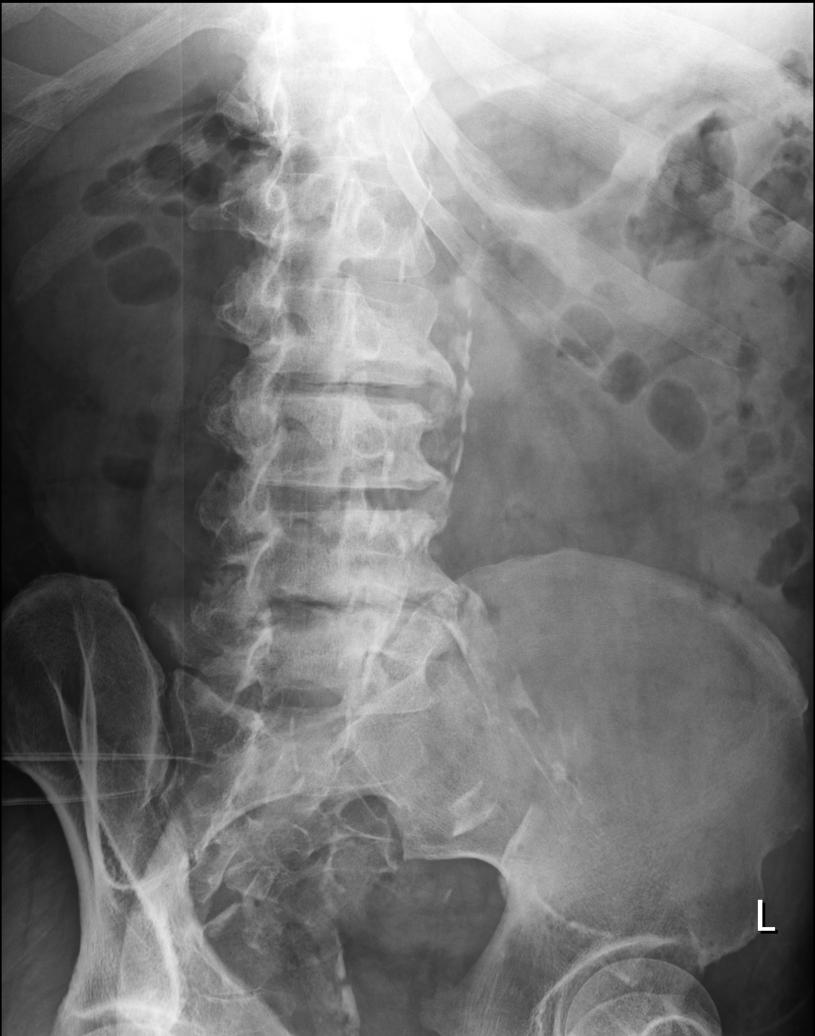

[dg lumbar spine complete 4 +v (3 of 5)]
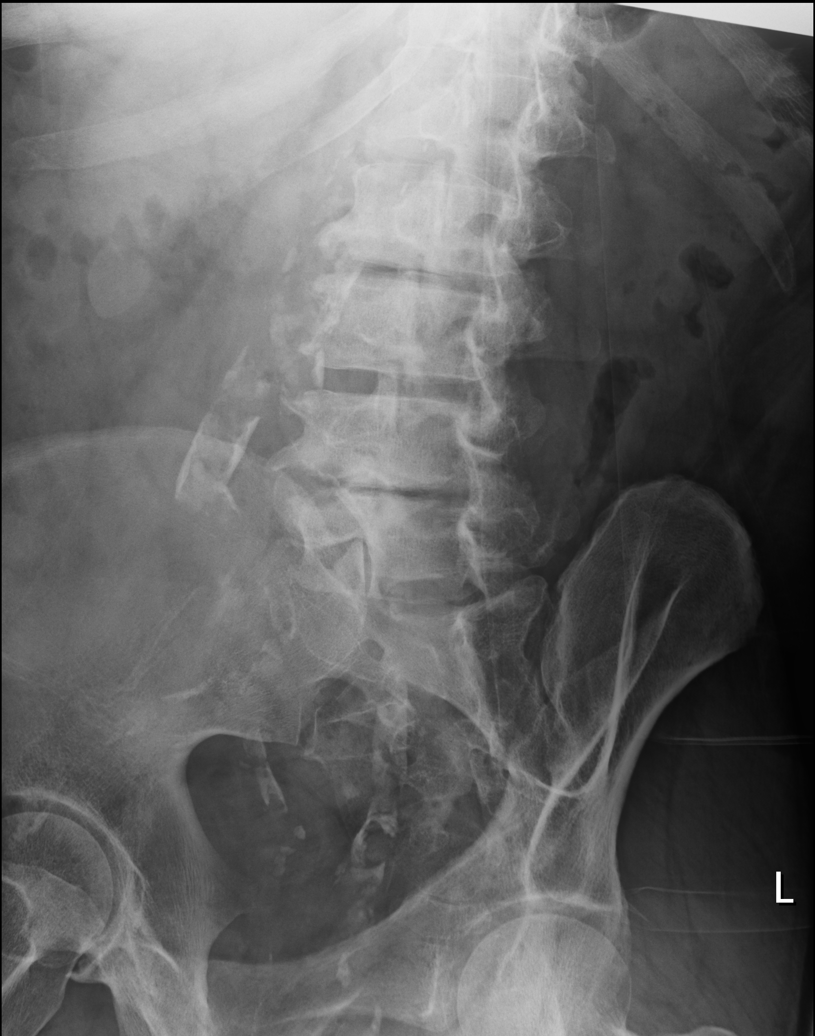

[dg lumbar spine complete 4 +v (4 of 5)]
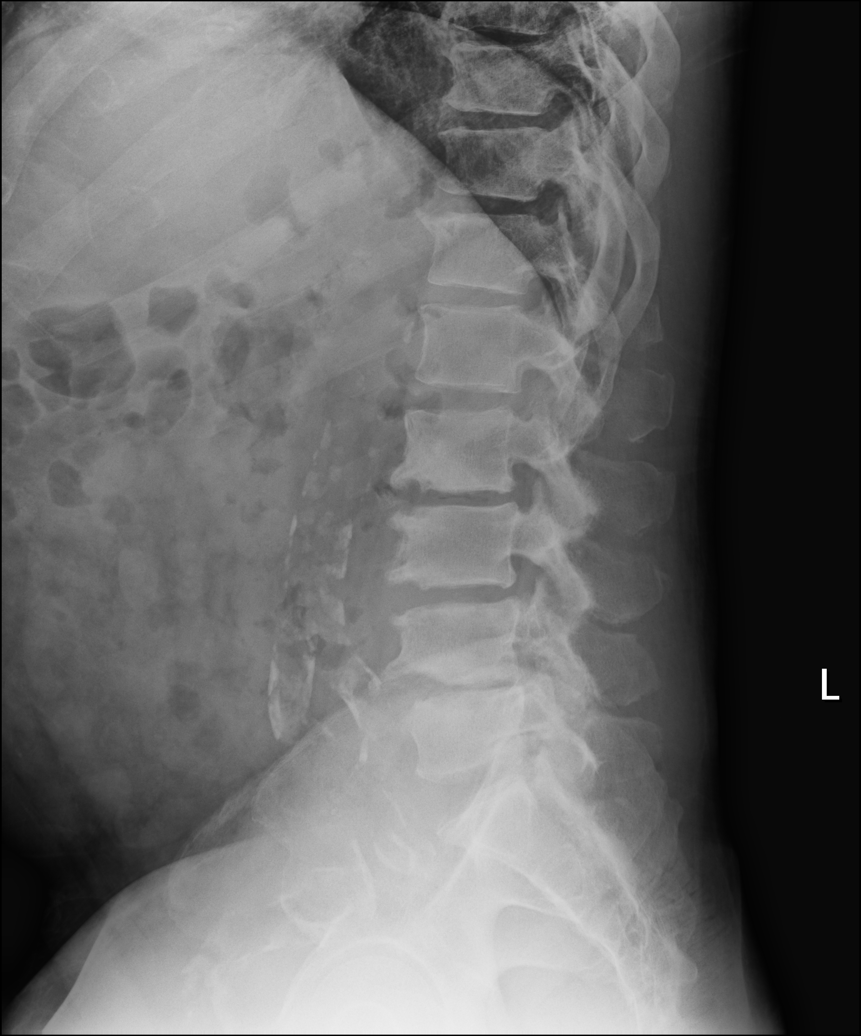

[dg lumbar spine complete 4 +v (5 of 5)]
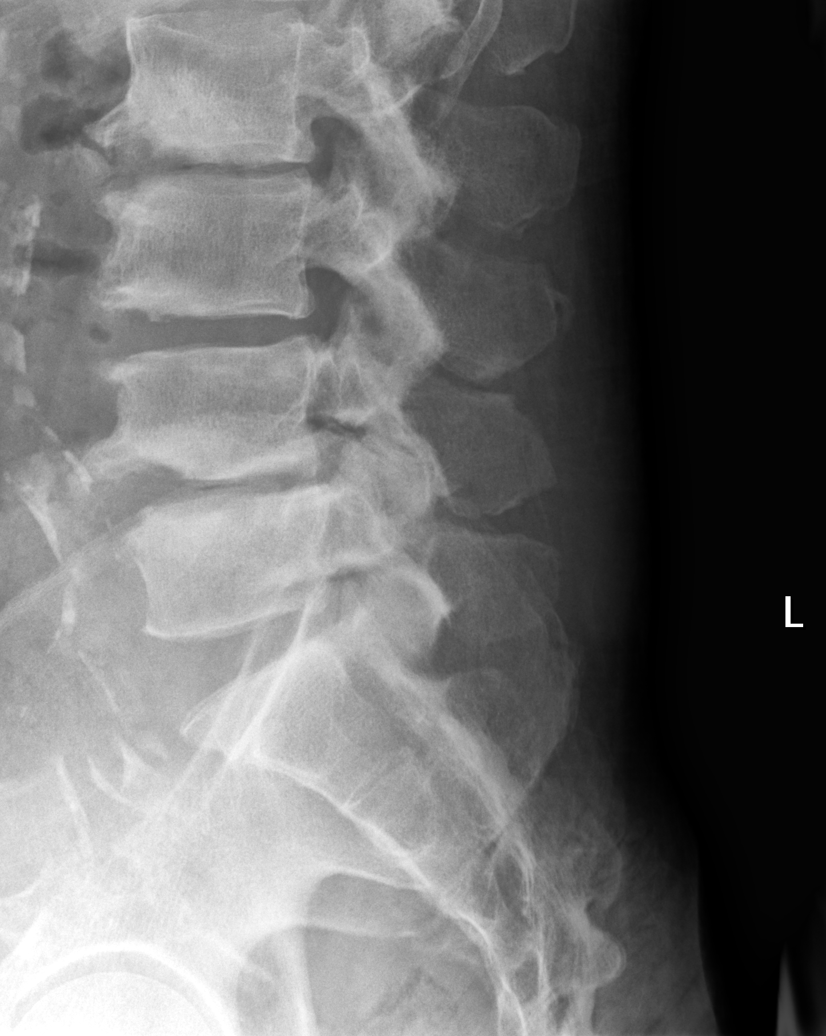

[5 of 5 positions shown; findings below may reference images not displayed]

FINDINGS: Five lumbar type vertebral bodies. No acute fracture or subluxation.
Vertebral body heights are preserved.

Straightening of the normal lumbar lordosis. No listhesis. Unchanged
moderate disc height loss and prominent anterior endplate spurring
from L2-L3 through L4-L5. Mild diffuse facet arthropathy.

Sacroiliac joints are unremarkable. Aortoiliac atherosclerotic
vascular disease.
IMPRESSION: 1. Unchanged moderate lumbar spondylosis.

## 2020-08-12 ENCOUNTER — Other Ambulatory Visit: Payer: Self-pay | Admitting: Surgery

## 2020-08-26 NOTE — Progress Notes (Signed)
Pt's pmh, comorbidities, multiple ER visits in last year c/o sob or chest pain reviewed with Dr Michelle Piper. Pt's surgery scheduled 9/30 should be done at the main hospital. LVM with [facility] surgery scheduler.

## 2020-08-29 ENCOUNTER — Other Ambulatory Visit (HOSPITAL_COMMUNITY)
Admission: RE | Admit: 2020-08-29 | Discharge: 2020-08-29 | Disposition: A | Discharge status: Home / Self Care | Payer: Medicaid Other | Source: Ambulatory Visit | Attending: Surgery | Admitting: Surgery

## 2020-08-31 NOTE — H&P (Signed)
   Darryl Hurley Appointment: 08/12/2020 9:20 AM Location: [facility] Patient #: [phone] DOB: 07/03/1961 Single / Language: Darryl Hurley / Race: Black or African American Male   History of Present Illness (Darryl Hurley A. Magnus Ivan MD; 08/12/2020 9:16 AM) The patient is a 59 year old male who presents with an inguinal hernia.  Chief complaint: Bilateral inguinal hernias  This gentleman was seen in back in 2018 by Dr. Cliffton Hurley with a left inguinal hernia. He has a history of drug and alcohol abuse. Surgery was deferred. He continues to have pain in his left groin. He had another CAT scan in July of this year showing stable left inguinal hernia containing fat. He also had a smaller right inguinal hernia. He does not notice the right inguinal hernia. He has moderate pain in the groin that most of his pain is in his right back.   Allergies Doristine Devoid, CMA; 08/12/2020 9:06 AM) No Known Drug Allergies  [07/26/2017]:  Medication History Doristine Devoid, CMA; 08/12/2020 9:06 AM) MetFORMIN HCl (1000MG  Tablet, Oral) Active. Losartan Potassium (100MG  Tablet, Oral) Active. Allopurinol (100MG  Tablet, Oral) Active. Albuterol (90MCG/ACT Aerosol Soln, Inhalation) Active. Naproxen (500MG  Tablet, Oral) Active. Medications Reconciled  Vitals (Darryl Hurley CMA; 08/12/2020 9:06 AM) 08/12/2020 9:06 AM Weight: 177.4 lb Height: 65in Body Surface Area: 1.88 m Body Mass Index: 29.52 kg/m  Temp.: 98.69F  Pulse: 89 (Regular)  BP: 132/80(Sitting, Left Arm, Standard)       Physical Exam (Darryl Hurley A. MD; 08/12/2020 9:17 AM) The physical exam findings are as follows: Note: Physical exam, he is walking with a cane  He has a large, chronically incarcerated, tender left inguinal hernia. He has a much smaller, easily reducible right inguinal hernia. There is no evidence of umbilical hernia.    Assessment & Plan (Darryl Hurley A. 10/12/2020 MD; 08/12/2020 9:18 AM) BILATERAL  INGUINAL HERNIA (K40.20) Impression: He is now interested inguinal hernia repair. I discussed the diagnosis with him in detail. I would recommend a bilateral laparoscopic inguinal hernia repair with mesh. I discussed procedure with him in detail. I discussed the risk which includes but is not limited to bleeding, infection, use of mesh, nerve entrapment, chronic pain, the need to convert to an open procedure, cardiopulmonary issues, postoperative recovery, etc. I discussed the importance of abstaining from alcohol and any drugs preoperatively.

## 2020-09-01 ENCOUNTER — Encounter (HOSPITAL_COMMUNITY)
Admission: RE | Disposition: A | Discharge status: Home / Self Care | Payer: Self-pay | Source: Home / Self Care | Attending: Surgery

## 2020-09-01 ENCOUNTER — Ambulatory Visit (HOSPITAL_COMMUNITY)
Admission: RE | Admit: 2020-09-01 | Discharge: 2020-09-01 | Disposition: A | Discharge status: Home / Self Care | Payer: Medicaid Other | Attending: Surgery | Admitting: Surgery

## 2020-09-01 ENCOUNTER — Encounter (HOSPITAL_COMMUNITY): Payer: Self-pay | Admitting: Surgery

## 2020-09-01 ENCOUNTER — Ambulatory Visit (HOSPITAL_COMMUNITY): Payer: Medicaid Other | Admitting: Anesthesiology

## 2020-09-01 ENCOUNTER — Other Ambulatory Visit: Payer: Self-pay

## 2020-09-01 DIAGNOSIS — Z7984 Long term (current) use of oral hypoglycemic drugs: Secondary | ICD-10-CM | POA: Insufficient documentation

## 2020-09-01 DIAGNOSIS — K4020 Bilateral inguinal hernia, without obstruction or gangrene, not specified as recurrent: Secondary | ICD-10-CM | POA: Diagnosis present

## 2020-09-01 DIAGNOSIS — Z79899 Other long term (current) drug therapy: Secondary | ICD-10-CM | POA: Insufficient documentation

## 2020-09-01 DIAGNOSIS — Z20822 Contact with and (suspected) exposure to covid-19: Secondary | ICD-10-CM | POA: Insufficient documentation

## 2020-09-01 HISTORY — PX: INSERTION OF MESH: SHX5868

## 2020-09-01 HISTORY — PX: INGUINAL HERNIA REPAIR: SHX194

## 2020-09-01 HISTORY — DX: Acute embolism and thrombosis of unspecified deep veins of unspecified lower extremity: I82.409

## 2020-09-01 LAB — COMPREHENSIVE METABOLIC PANEL
ALT: 12 U/L (ref 0–44)
AST: 21 U/L (ref 15–41)
Albumin: 4 g/dL (ref 3.5–5.0)
Alkaline Phosphatase: 60 U/L (ref 38–126)
Anion gap: 12 (ref 5–15)
BUN: 20 mg/dL (ref 6–20)
CO2: 22 mmol/L (ref 22–32)
Calcium: 9.7 mg/dL (ref 8.9–10.3)
Chloride: 101 mmol/L (ref 98–111)
Creatinine, Ser: 1.41 mg/dL — ABNORMAL HIGH (ref 0.61–1.24)
GFR calc Af Amer: >60 mL/min (ref >60)
GFR calc non Af Amer: 55 mL/min — ABNORMAL LOW (ref >60)
Glucose, Bld: 95 mg/dL (ref 70–99)
Potassium: 4.7 mmol/L (ref 3.5–5.1)
Sodium: 135 mmol/L (ref 135–145)
Total Bilirubin: 0.8 mg/dL (ref 0.3–1.2)
Total Protein: 7.6 g/dL (ref 6.5–8.1)

## 2020-09-01 LAB — RAPID URINE DRUG SCREEN, HOSP PERFORMED
Amphetamines: NOT DETECTED
Barbiturates: NOT DETECTED
Benzodiazepines: NOT DETECTED
Cocaine: NOT DETECTED
Opiates: NOT DETECTED
Tetrahydrocannabinol: NOT DETECTED

## 2020-09-01 LAB — CBC
HCT: 47 % (ref 39.0–52.0)
Hemoglobin: 14.8 g/dL (ref 13.0–17.0)
MCH: 28.8 pg (ref 26.0–34.0)
MCHC: 31.5 g/dL (ref 30.0–36.0)
MCV: 91.6 fL (ref 80.0–100.0)
Platelets: 325 10*3/uL (ref 150–400)
RBC: 5.13 MIL/uL (ref 4.22–5.81)
RDW: 17.9 % — ABNORMAL HIGH (ref 11.5–15.5)
WBC: 6.6 10*3/uL (ref 4.0–10.5)
nRBC: 0 % (ref 0.0–0.2)

## 2020-09-01 LAB — GLUCOSE, CAPILLARY
Glucose-Capillary: 102 mg/dL — ABNORMAL HIGH (ref 70–99)
Glucose-Capillary: 138 mg/dL — ABNORMAL HIGH (ref 70–99)

## 2020-09-01 LAB — SARS CORONAVIRUS 2 BY RT PCR (HOSPITAL ORDER, PERFORMED IN ~~LOC~~ HOSPITAL LAB): SARS Coronavirus 2: NEGATIVE

## 2020-09-01 SURGERY — REPAIR, HERNIA, INGUINAL, LAPAROSCOPIC
Anesthesia: General | Site: Abdomen | Laterality: Bilateral

## 2020-09-01 MED ORDER — CEFAZOLIN SODIUM 1 G IJ SOLR
INTRAMUSCULAR | Status: AC
  Filled 2020-09-01: qty 20

## 2020-09-01 MED ORDER — KETAMINE HCL 10 MG/ML IJ SOLN
INTRAMUSCULAR | Status: DC | PRN
  Administered 2020-09-01: 50 mg via INTRAVENOUS

## 2020-09-01 MED ORDER — GABAPENTIN 300 MG PO CAPS: 300.0000 mg | ORAL_CAPSULE | ORAL | Status: AC

## 2020-09-01 MED ORDER — OXYCODONE HCL 5 MG PO TABS: 5.0000 mg | ORAL_TABLET | Freq: Four times a day (QID) | ORAL | 0 refills | Status: DC | PRN

## 2020-09-01 MED ORDER — CHLORHEXIDINE GLUCONATE 0.12 % MT SOLN
OROMUCOSAL | Status: AC
  Administered 2020-09-01: 15 mL via OROMUCOSAL
  Filled 2020-09-01: qty 15

## 2020-09-01 MED ORDER — OXYCODONE HCL 5 MG/5ML PO SOLN: 5.0000 mg | Freq: Once | ORAL | Status: AC | PRN

## 2020-09-01 MED ORDER — ENSURE PRE-SURGERY PO LIQD: 296.0000 mL | Freq: Once | ORAL | Status: DC

## 2020-09-01 MED ORDER — PROPOFOL 10 MG/ML IV BOLUS
INTRAVENOUS | Status: AC
  Filled 2020-09-01: qty 20

## 2020-09-01 MED ORDER — HYDROMORPHONE HCL 1 MG/ML IJ SOLN
INTRAMUSCULAR | Status: AC
  Filled 2020-09-01: qty 1

## 2020-09-01 MED ORDER — CHLORHEXIDINE GLUCONATE CLOTH 2 % EX PADS: 6.0000 | MEDICATED_PAD | Freq: Once | CUTANEOUS | Status: DC

## 2020-09-01 MED ORDER — DEXAMETHASONE SODIUM PHOSPHATE 10 MG/ML IJ SOLN
INTRAMUSCULAR | Status: DC | PRN
  Administered 2020-09-01: 5 mg via INTRAVENOUS

## 2020-09-01 MED ORDER — ONDANSETRON HCL 4 MG/2ML IJ SOLN
INTRAMUSCULAR | Status: AC
  Filled 2020-09-01: qty 2

## 2020-09-01 MED ORDER — ACETAMINOPHEN 500 MG PO TABS
ORAL_TABLET | ORAL | Status: AC
  Administered 2020-09-01: 1000 mg via ORAL
  Filled 2020-09-01: qty 2

## 2020-09-01 MED ORDER — LIDOCAINE 2% (20 MG/ML) 5 ML SYRINGE
INTRAMUSCULAR | Status: AC
  Filled 2020-09-01: qty 5

## 2020-09-01 MED ORDER — BUPIVACAINE HCL (PF) 0.25 % IJ SOLN
INTRAMUSCULAR | Status: AC
  Filled 2020-09-01: qty 30

## 2020-09-01 MED ORDER — PROPOFOL 10 MG/ML IV BOLUS
INTRAVENOUS | Status: DC | PRN
  Administered 2020-09-01: 140 mg via INTRAVENOUS
  Administered 2020-09-01: 60 mg via INTRAVENOUS

## 2020-09-01 MED ORDER — DEXMEDETOMIDINE (PRECEDEX) IN NS 20 MCG/5ML (4 MCG/ML) IV SYRINGE
PREFILLED_SYRINGE | INTRAVENOUS | Status: DC | PRN
  Administered 2020-09-01 (×2): 20 ug via INTRAVENOUS

## 2020-09-01 MED ORDER — CEFAZOLIN SODIUM-DEXTROSE 2-4 GM/100ML-% IV SOLN
INTRAVENOUS | Status: AC
  Filled 2020-09-01: qty 100

## 2020-09-01 MED ORDER — ACETAMINOPHEN 500 MG PO TABS: 1000.0000 mg | ORAL_TABLET | ORAL | Status: AC

## 2020-09-01 MED ORDER — LABETALOL HCL 5 MG/ML IV SOLN
INTRAVENOUS | Status: AC
  Filled 2020-09-01: qty 4

## 2020-09-01 MED ORDER — MIDAZOLAM HCL 2 MG/2ML IJ SOLN
INTRAMUSCULAR | Status: AC
  Filled 2020-09-01: qty 2

## 2020-09-01 MED ORDER — LIDOCAINE 2% (20 MG/ML) 5 ML SYRINGE
INTRAMUSCULAR | Status: DC | PRN
  Administered 2020-09-01: 80 mg via INTRAVENOUS

## 2020-09-01 MED ORDER — CHLORHEXIDINE GLUCONATE 0.12 % MT SOLN: 15.0000 mL | Freq: Once | OROMUCOSAL | Status: AC

## 2020-09-01 MED ORDER — LACTATED RINGERS IV SOLN: INTRAVENOUS | Status: DC

## 2020-09-01 MED ORDER — SUGAMMADEX SODIUM 200 MG/2ML IV SOLN
INTRAVENOUS | Status: DC | PRN
  Administered 2020-09-01 (×2): 200 mg via INTRAVENOUS

## 2020-09-01 MED ORDER — HYDROMORPHONE HCL 1 MG/ML IJ SOLN
0.2500 mg | INTRAMUSCULAR | Status: DC | PRN
  Administered 2020-09-01: 0.25 mg via INTRAVENOUS

## 2020-09-01 MED ORDER — MIDAZOLAM HCL 2 MG/2ML IJ SOLN
INTRAMUSCULAR | Status: DC | PRN
  Administered 2020-09-01: 2 mg via INTRAVENOUS

## 2020-09-01 MED ORDER — FENTANYL CITRATE (PF) 250 MCG/5ML IJ SOLN
INTRAMUSCULAR | Status: AC
  Filled 2020-09-01: qty 5

## 2020-09-01 MED ORDER — ONDANSETRON HCL 4 MG/2ML IJ SOLN
INTRAMUSCULAR | Status: DC | PRN
  Administered 2020-09-01: 4 mg via INTRAVENOUS

## 2020-09-01 MED ORDER — CEFAZOLIN SODIUM-DEXTROSE 2-4 GM/100ML-% IV SOLN
2.0000 g | INTRAVENOUS | Status: AC
  Administered 2020-09-01: 2 g via INTRAVENOUS

## 2020-09-01 MED ORDER — OXYCODONE HCL 5 MG PO TABS
ORAL_TABLET | ORAL | Status: AC
  Filled 2020-09-01: qty 1

## 2020-09-01 MED ORDER — ROCURONIUM BROMIDE 10 MG/ML (PF) SYRINGE
PREFILLED_SYRINGE | INTRAVENOUS | Status: DC | PRN
  Administered 2020-09-01: 50 mg via INTRAVENOUS
  Administered 2020-09-01: 20 mg via INTRAVENOUS

## 2020-09-01 MED ORDER — DEXAMETHASONE SODIUM PHOSPHATE 10 MG/ML IJ SOLN
INTRAMUSCULAR | Status: AC
  Filled 2020-09-01: qty 1

## 2020-09-01 MED ORDER — OXYCODONE HCL 5 MG PO TABS
5.0000 mg | ORAL_TABLET | Freq: Once | ORAL | Status: AC | PRN
  Administered 2020-09-01: 5 mg via ORAL

## 2020-09-01 MED ORDER — 0.9 % SODIUM CHLORIDE (POUR BTL) OPTIME
TOPICAL | Status: DC | PRN
  Administered 2020-09-01: 1000 mL

## 2020-09-01 MED ORDER — MEPERIDINE HCL 25 MG/ML IJ SOLN: 6.2500 mg | INTRAMUSCULAR | Status: DC | PRN

## 2020-09-01 MED ORDER — GABAPENTIN 100 MG PO CAPS
ORAL_CAPSULE | ORAL | Status: AC
  Administered 2020-09-01: 300 mg via ORAL
  Filled 2020-09-01: qty 3

## 2020-09-01 MED ORDER — KETAMINE HCL 50 MG/5ML IJ SOSY
PREFILLED_SYRINGE | INTRAMUSCULAR | Status: AC
  Filled 2020-09-01: qty 5

## 2020-09-01 MED ORDER — DEXMEDETOMIDINE (PRECEDEX) IN NS 20 MCG/5ML (4 MCG/ML) IV SYRINGE
PREFILLED_SYRINGE | INTRAVENOUS | Status: AC
  Filled 2020-09-01: qty 10

## 2020-09-01 MED ORDER — PROMETHAZINE HCL 25 MG/ML IJ SOLN: 6.2500 mg | INTRAMUSCULAR | Status: DC | PRN

## 2020-09-01 MED ORDER — FENTANYL CITRATE (PF) 250 MCG/5ML IJ SOLN
INTRAMUSCULAR | Status: DC | PRN
Start: 2020-09-01 — End: 2020-09-01
  Administered 2020-09-01 (×2): 50 ug via INTRAVENOUS
  Administered 2020-09-01: 150 ug via INTRAVENOUS

## 2020-09-01 MED ORDER — LABETALOL HCL 5 MG/ML IV SOLN
INTRAVENOUS | Status: DC | PRN
  Administered 2020-09-01 (×2): 5 mg via INTRAVENOUS

## 2020-09-01 MED ORDER — BUPIVACAINE HCL (PF) 0.25 % IJ SOLN
INTRAMUSCULAR | Status: DC | PRN
  Administered 2020-09-01: 20 mL

## 2020-09-01 SURGICAL SUPPLY — 39 items
ADH SKN CLS APL DERMABOND .7 (GAUZE/BANDAGES/DRESSINGS) ×1
APL PRP STRL LF DISP 70% ISPRP (MISCELLANEOUS) ×1
APPLIER CLIP LOGIC TI 5 (MISCELLANEOUS) IMPLANT
APR CLP MED LRG 33X5 (MISCELLANEOUS)
BLADE CLIPPER SURG (BLADE) ×1 IMPLANT
CANISTER SUCT 3000ML PPV (MISCELLANEOUS) IMPLANT
CHLORAPREP W/TINT 26 (MISCELLANEOUS) ×2 IMPLANT
COVER SURGICAL LIGHT HANDLE (MISCELLANEOUS) ×2 IMPLANT
COVER WAND RF STERILE (DRAPES) ×1 IMPLANT
DERMABOND ADVANCED (GAUZE/BANDAGES/DRESSINGS) ×1
DERMABOND ADVANCED .7 DNX12 (GAUZE/BANDAGES/DRESSINGS) IMPLANT
DEVICE SECURE STRAP 25 ABSORB (INSTRUMENTS) ×2 IMPLANT
DISSECT BALLN SPACEMKR + OVL (BALLOONS) ×2
DISSECTOR BALLN SPACEMKR + OVL (BALLOONS) ×1 IMPLANT
DISSECTOR BLUNT TIP ENDO 5MM (MISCELLANEOUS) IMPLANT
DRAPE LAPAROSCOPIC ABDOMINAL (DRAPES) ×1 IMPLANT
ELECT REM PT RETURN 9FT ADLT (ELECTROSURGICAL) ×2
ELECTRODE REM PT RTRN 9FT ADLT (ELECTROSURGICAL) ×1 IMPLANT
GLOVE SURG SIGNA 7.5 PF LTX (GLOVE) ×2 IMPLANT
GOWN STRL REUS W/ TWL LRG LVL3 (GOWN DISPOSABLE) ×2 IMPLANT
GOWN STRL REUS W/ TWL XL LVL3 (GOWN DISPOSABLE) ×1 IMPLANT
GOWN STRL REUS W/TWL LRG LVL3 (GOWN DISPOSABLE) ×4
GOWN STRL REUS W/TWL XL LVL3 (GOWN DISPOSABLE) ×2
KIT BASIN OR (CUSTOM PROCEDURE TRAY) ×2 IMPLANT
KIT TURNOVER KIT B (KITS) ×2 IMPLANT
MESH 3DMAX 4X6 LT LRG (Mesh General) ×1 IMPLANT
MESH 3DMAX 4X6 RT LRG (Mesh General) ×1 IMPLANT
NS IRRIG 1000ML POUR BTL (IV SOLUTION) ×2 IMPLANT
PAD ARMBOARD 7.5X6 YLW CONV (MISCELLANEOUS) ×2 IMPLANT
SET IRRIG TUBING LAPAROSCOPIC (IRRIGATION / IRRIGATOR) IMPLANT
SET TROCAR LAP APPLE-HUNT 5MM (ENDOMECHANICALS) ×1 IMPLANT
SET TUBE SMOKE EVAC HIGH FLOW (TUBING) ×2 IMPLANT
SUT MNCRL AB 4-0 PS2 18 (SUTURE) ×2 IMPLANT
TOWEL GREEN STERILE (TOWEL DISPOSABLE) ×2 IMPLANT
TOWEL GREEN STERILE FF (TOWEL DISPOSABLE) ×2 IMPLANT
TRAY FOLEY W/BAG SLVR 16FR (SET/KITS/TRAYS/PACK)
TRAY FOLEY W/BAG SLVR 16FR ST (SET/KITS/TRAYS/PACK) IMPLANT
TRAY LAPAROSCOPIC MC (CUSTOM PROCEDURE TRAY) ×2 IMPLANT
WATER STERILE IRR 1000ML POUR (IV SOLUTION) ×2 IMPLANT

## 2020-09-01 NOTE — Anesthesia Procedure Notes (Signed)
Procedure Name: Intubation Date/Time: 09/01/2020 3:01 PM Performed by: Janace Litten, CRNA Pre-anesthesia Checklist: Patient identified, Emergency Drugs available, Suction available and Patient being monitored Patient Re-evaluated:Patient Re-evaluated prior to induction Oxygen Delivery Method: Circle System Utilized Preoxygenation: Pre-oxygenation with 100% oxygen Induction Type: IV induction Ventilation: Mask ventilation without difficulty Laryngoscope Size: Mac and 4 Grade View: Grade I Tube type: Oral Tube size: 7.5 mm Number of attempts: 1 Airway Equipment and Method: Stylet Placement Confirmation: ETT inserted through vocal cords under direct vision,  positive ETCO2 and breath sounds checked- equal and bilateral Secured at: 23 cm Tube secured with: Tape Dental Injury: Teeth and Oropharynx as per pre-operative assessment

## 2020-09-01 NOTE — Op Note (Signed)
BILATERAL LAPAROSCOPIC INGUINAL HERNIA REPAIR WITH MESH, INSERTION OF MESH  Procedure Note  NISHANT SCHRECENGOST 09/01/2020   Pre-op Diagnosis: BILATERL INGUINAL HERNIA     Post-op Diagnosis: same  Procedure(s): BILATERAL LAPAROSCOPIC INGUINAL HERNIA REPAIR WITH MESH INSERTION OF MESH  Surgeon(s): Abigail Miyamoto, MD  Anesthesia: General  Staff:  Circulator: Jeronimo Greaves, RN Scrub Person: Doy Mince, RN; Angelena Form; [place], Karrie S, RN  Estimated Blood Loss: Minimal               Findings: The patient was found to have bilateral indirect inguinal hernias.  Left side contained a large amount of omentum.  Each side was repaired with a large piece of Prolene 3 DMax mesh  Procedure: The patient was brought to the operating room identifies correct patient.  He was placed upon the operating table general anesthesia was induced.  His abdomen was then prepped and draped in usual sterile fashion.  I made a small vertical incision below the umbilicus with a scalpel.  I carried this down to the fascia which was opened just to the right of the midline.  The rectus muscle was then elevated.  I passed the dissecting balloon underneath the rectus muscle manipulated toward the pubis.  The dissecting balloon was then insufflated dissect out the preperitoneal space.  Insufflation was begun with carbon oxide.  A small hole tore in the peritoneum at the umbilical site.  After manipulating the trocar I was finally able to get 2 trochars in the midline and the preperitoneal space under direct vision.  I was then finally able to dissect out the preperitoneal space.  I dissected out the right inguinal area and found a small indirect hernia sac which was easily reduced from the cord structures.  I then dissected out the left inguinal area and found a very large indirect hernia with a large amount of omentum going down the scrotum.  After significant dissection I was finally able to get the omentum  to release up out of the hernia and pulled down the rest of the hernia sac.  I then brought a large piece of 3D max Prolene mesh onto the field.  A left-sided piece of mesh was used and placed through the umbilical trocar and opened as an only left inguinal floor.  I then tacked the mesh in place to Cooper's ligament, up the mid abdominal wall, slightly laterally with absorbable tacker.  Another large 3D max piece of right-sided Prolene mesh was brought to the field.  I again placed at the umbilical trocar and opened as an onlay on the right inguinal floor.  I again tacked the mesh to Cooper's ligament up the medial abdominal wall and slightly laterally.  Wide coverage of the internal rings and cord structures appear to be achieved.  At this point hemostasis peer to be achieved as well.  I removed the trochars at the midline under direct vision and watched the preperitoneal space collapse with the mesh lying in the appropriate position.  I then removed the remaining trocar and freed the pneumoperitoneum from the abdominal cavity.  I then closed the fascia of the midline with a figure-of-eight 0 Vicryl suture.  I anesthetized all incisions with Marcaine and performed bilateral ilioinguinal nerve blocks Marcaine as well.  I then closed all incision with 4-0 Monocryl sutures and Dermabond.  The patient tolerated the procedure well.  All the counts were correct at the end of the procedure.  The patient was then extubated in the  operating room and taken in stable condition to the recovery room.          Abigail Miyamoto   Date: 09/01/2020  Time: 3:59 PM

## 2020-09-01 NOTE — Transfer of Care (Signed)
Immediate Anesthesia Transfer of Care Note  Patient: Darryl Hurley  Procedure(s) Performed: BILATERAL LAPAROSCOPIC INGUINAL HERNIA REPAIR WITH MESH (Bilateral Abdomen) INSERTION OF MESH (Bilateral Abdomen)  Patient Location: PACU  Anesthesia Type:General  Level of Consciousness: drowsy, patient cooperative and responds to stimulation  Airway & Oxygen Therapy: Patient Spontanous Breathing and Patient connected to nasal cannula oxygen  Post-op Assessment: Report given to RN and Post -op Vital signs reviewed and stable  Post vital signs: Reviewed and stable  Last Vitals:  Vitals Value Taken Time  BP 174/95 09/01/20 1611  Temp    Pulse 64 09/01/20 1611  Resp 19 09/01/20 1611  SpO2 98 % 09/01/20 1611  Vitals shown include unvalidated device data.  Last Pain:  Vitals:   09/01/20 1329  TempSrc:   PainSc: 7       Patients Stated Pain Goal: 3 (09/01/20 1329)  Complications: No complications documented.

## 2020-09-01 NOTE — Interval H&P Note (Signed)
History and Physical Interval Note: no change in H and P  09/01/2020 2:22 PM  Darryl Hurley  has presented today for surgery, with the diagnosis of BILATERL INGUINAL HERNIA.  The various methods of treatment have been discussed with the patient and family. After consideration of risks, benefits and other options for treatment, the patient has consented to  Procedure(s): BILATERAL LAPAROSCOPIC INGUINAL HERNIA REPAIR WITH MESH (Bilateral) as a surgical intervention.  The patient's history has been reviewed, patient examined, no change in status, stable for surgery.  I have reviewed the patient's chart and labs.  Questions were answered to the patient's satisfaction.     Abigail Miyamoto

## 2020-09-01 NOTE — Anesthesia Preprocedure Evaluation (Signed)
Anesthesia Evaluation  Patient identified by MRN, date of birth, ID band Patient awake    Reviewed: Allergy & Precautions, H&P , NPO status , Patient's Chart, lab work & pertinent test results  Airway Mallampati: I  TM Distance: >3 FB Neck ROM: Full    Dental no notable dental hx. (+) Missing, Dental Advisory Given,    Pulmonary asthma , pneumonia, COPD,  COPD inhaler and oxygen dependent, Current Smoker and Patient abstained from smoking.,    Pulmonary exam normal        Cardiovascular hypertension, Pt. on medications + angina  Rhythm:Regular Rate:Tachycardia     Neuro/Psych  Headaches, Seizures -,  negative psych ROS   GI/Hepatic negative GI ROS, Neg liver ROS,   Endo/Other  diabetes, Type 2, Oral Hypoglycemic Agents  Renal/GU negative Renal ROS  negative genitourinary   Musculoskeletal  (+) Arthritis ,   Abdominal   Peds  Hematology  (+) Blood dyscrasia, anemia ,   Anesthesia Other Findings   Reproductive/Obstetrics negative OB ROS                             Anesthesia Physical  Anesthesia Plan  ASA: III  Anesthesia Plan: General   Post-op Pain Management:    Induction: Intravenous  PONV Risk Score and Plan: 2 and Ondansetron, Midazolam and Dexamethasone  Airway Management Planned: Oral ETT  Additional Equipment: None  Intra-op Plan:   Post-operative Plan: Extubation in OR  Informed Consent: I have reviewed the patients History and Physical, chart, labs and discussed the procedure including the risks, benefits and alternatives for the proposed anesthesia with the patient or authorized representative who has indicated his/her understanding and acceptance.     Dental advisory given  Plan Discussed with: CRNA  Anesthesia Plan Comments:         Anesthesia Quick Evaluation

## 2020-09-01 NOTE — Discharge Instructions (Signed)
[facility] _______[facility]  UMBILICAL OR INGUINAL HERNIA REPAIR: POST OP INSTRUCTIONS  Always review your discharge instruction sheet given to you by the facility where your surgery was performed. IF YOU HAVE DISABILITY OR FAMILY LEAVE FORMS, YOU MUST BRING THEM TO THE OFFICE FOR PROCESSING.   DO NOT GIVE THEM TO YOUR DOCTOR.  1. A  prescription for pain medication may be given to you upon discharge.  Take your pain medication as prescribed, if needed.  If narcotic pain medicine is not needed, then you may take acetaminophen (Tylenol) or ibuprofen (Advil) as needed. 2. Take your usually prescribed medications unless otherwise directed. If you need a refill on your pain medication, please contact your pharmacy.  They will contact our office to request authorization. Prescriptions will not be filled after 5 pm or on week-ends. 3. You should follow a light diet the first 24 hours after arrival home, such as soup and crackers, etc.  Be sure to include lots of fluids daily.  Resume your normal diet the day after surgery. 4.Most patients will experience some swelling and bruising around the umbilicus or in the groin and scrotum.  Ice packs and reclining will help.  Swelling and bruising can take several days to resolve.  6. It is common to experience some constipation if taking pain medication after surgery.  Increasing fluid intake and taking a stool softener (such as Colace) will usually help or prevent this problem from occurring.  A mild laxative (Milk of Magnesia or Miralax) should be taken according to package directions if there are no bowel movements after 48 hours. 7. Unless discharge instructions indicate otherwise, you may remove your bandages 24-48 hours after surgery, and you may shower at that time.  You may have steri-strips (small skin tapes) in place directly over the incision.  These strips should be left on the skin for 7-10 days.  If your surgeon used skin glue on the  incision, you may shower in 24 hours.  The glue will flake off over the next 2-3 weeks.  Any sutures or staples will be removed at the office during your follow-up visit. 8. ACTIVITIES:  You may resume regular (light) daily activities beginning the next day--such as daily self-care, walking, climbing stairs--gradually increasing activities as tolerated.  You may have sexual intercourse when it is comfortable.  Refrain from any heavy lifting or straining until approved by your doctor.  a.You may drive when you are no longer taking prescription pain medication, you can comfortably wear a seatbelt, and you can safely maneuver your car and apply brakes. b.RETURN TO WORK:   _____________________________________________  9.You should see your doctor in the office for a follow-up appointment approximately 2-3 weeks after your surgery.  Make sure that you call for this appointment within a day or two after you arrive home to insure a convenient appointment time. 10.OTHER INSTRUCTIONS: _OK TO SHOWER STARTING TOMORROW ICE PACK, TYLENOL, AND IBUPROFEN ALSO FOR PAIN NO LIFTING MORE THAN 15 POUNDS FOR 4 WEEKS________________________    _____________________________________  WHEN TO CALL YOUR DOCTOR: 1. Fever over 101.0 2. Inability to urinate 3. Nausea and/or vomiting 4. Extreme swelling or bruising 5. Continued bleeding from incision. 6. Increased pain, redness, or drainage from the incision  The clinic staff is available to answer your questions during regular business hours.  Please don't hesitate to call and ask to speak to one of the nurses for clinical concerns.  If you have a medical emergency, go to the nearest   emergency room or call 911.  A surgeon from [facility] is always on call at the hospital   [address], [address], [place], [place]  [address] ?  [address] [address], [place], [place]   [address] [phone] ? [phone] ? FAX [phone] Web site:  [email or web address] 

## 2020-09-02 ENCOUNTER — Encounter (HOSPITAL_COMMUNITY): Payer: Self-pay | Admitting: Surgery

## 2020-09-02 NOTE — Anesthesia Postprocedure Evaluation (Signed)
Anesthesia Post Note  Patient: JANZIEL HOCKETT  Procedure(s) Performed: BILATERAL LAPAROSCOPIC INGUINAL HERNIA REPAIR WITH MESH (Bilateral Abdomen) INSERTION OF MESH (Bilateral Abdomen)     Patient location during evaluation: PACU Anesthesia Type: General Level of consciousness: sedated and patient cooperative Pain management: pain level controlled Vital Signs Assessment: post-procedure vital signs reviewed and stable Respiratory status: spontaneous breathing Cardiovascular status: stable Anesthetic complications: no   No complications documented.  Last Vitals:  Vitals:   09/01/20 1700 09/01/20 1709  BP:  (!) 167/98  Pulse: 63 63  Resp: 16 19  Temp:  36.5 C  SpO2: 96% 96%    Last Pain:  Vitals:   09/01/20 1641  TempSrc:   PainSc: 7                  Lewie Loron

## 2020-09-14 ENCOUNTER — Other Ambulatory Visit: Payer: Self-pay | Admitting: Family Medicine

## 2020-09-14 DIAGNOSIS — M25551 Pain in right hip: Secondary | ICD-10-CM

## 2020-09-14 DIAGNOSIS — M545 Low back pain, unspecified: Secondary | ICD-10-CM

## 2020-09-14 DIAGNOSIS — M5136 Other intervertebral disc degeneration, lumbar region: Secondary | ICD-10-CM

## 2020-10-14 ENCOUNTER — Ambulatory Visit: Payer: Self-pay | Admitting: Podiatry

## 2020-10-17 ENCOUNTER — Other Ambulatory Visit: Payer: Medicaid Other

## 2020-11-07 ENCOUNTER — Other Ambulatory Visit: Payer: Self-pay

## 2020-11-07 ENCOUNTER — Encounter: Payer: Self-pay | Admitting: Podiatry

## 2020-11-07 ENCOUNTER — Ambulatory Visit (INDEPENDENT_AMBULATORY_CARE_PROVIDER_SITE_OTHER): Payer: Medicaid Other | Admitting: Podiatry

## 2020-11-07 DIAGNOSIS — E1142 Type 2 diabetes mellitus with diabetic polyneuropathy: Secondary | ICD-10-CM | POA: Diagnosis not present

## 2020-11-07 DIAGNOSIS — M79674 Pain in right toe(s): Secondary | ICD-10-CM | POA: Diagnosis not present

## 2020-11-07 DIAGNOSIS — B353 Tinea pedis: Secondary | ICD-10-CM

## 2020-11-07 DIAGNOSIS — B351 Tinea unguium: Secondary | ICD-10-CM

## 2020-11-07 DIAGNOSIS — M79675 Pain in left toe(s): Secondary | ICD-10-CM

## 2020-11-10 MED ORDER — KETOCONAZOLE 2 % EX CREA: 1.0000 "application " | TOPICAL_CREAM | Freq: Every day | CUTANEOUS | 0 refills | Status: DC

## 2020-11-10 NOTE — Progress Notes (Signed)
  Subjective:  Patient ID: Darryl Hurley, male    DOB: 1961-04-12,  MRN: [id number]  Chief Complaint  Patient presents with  . Nail Problem    nail trim and DFC  no complaints  . Diabetes    59 y.o. male presents with the above complaint. History confirmed with patient.   Objective:  Physical Exam: warm, good capillary refill, no trophic changes or ulcerative lesions and normal DP and PT pulses.  Reduce the pressure sensation to monofilament and tips of toes and balls of feet.  Onychomycosis x10 is noted.  There is dry scaling tinea rash to moxa distribution.     Assessment:   1. Onychomycosis   2. Pain due to onychomycosis of toenails of both feet   3. Type 2 diabetes mellitus with diabetic polyneuropathy, without long-term current use of insulin (HCC)   4. Tinea pedis of both feet      Plan:  Patient was evaluated and treated and all questions answered.   Discussed the etiology and treatment options for tinea pedis.  Discussed topical and oral treatment.  Recommended topical treatment with 2% ketoconazole cream.  This was sent to the patient's pharmacy.  Also discussed appropriate foot hygiene, use of antifungal spray such as Tinactin in shoes, as well as cleaning foot surfaces such as showers and bathroom floors with bleach.  See him back in 1 month to reveal late for the tinea  Discussed the etiology and treatment options for the condition in detail with the patient. Educated patient on the topical and oral treatment options for mycotic nails. Recommended debridement of the nails today. Sharp and mechanical debridement performed of all painful and mycotic nails today. Nails debrided in length and thickness using a nail nipper and a mechanical burr to level of comfort. Discussed treatment options including appropriate shoe gear. Follow up as needed for painful nails.  Patient educated on diabetes. Discussed proper diabetic foot care and discussed risks and complications of  disease. Educated patient in depth on reasons to return to the office immediately should he/she discover anything concerning or new on the feet. All questions answered. Discussed proper shoes as well.     Return in about 1 month (around 12/08/2020).

## 2020-12-08 ENCOUNTER — Ambulatory Visit: Payer: Medicaid Other | Admitting: Podiatry

## 2020-12-22 ENCOUNTER — Other Ambulatory Visit: Payer: Self-pay

## 2020-12-22 DIAGNOSIS — I739 Peripheral vascular disease, unspecified: Secondary | ICD-10-CM

## 2020-12-27 ENCOUNTER — Encounter: Payer: Medicaid Other | Admitting: Vascular Surgery

## 2020-12-27 ENCOUNTER — Encounter (HOSPITAL_COMMUNITY): Payer: Medicaid Other

## 2021-01-06 ENCOUNTER — Encounter: Payer: Medicaid Other | Admitting: Vascular Surgery

## 2021-01-06 ENCOUNTER — Encounter (HOSPITAL_COMMUNITY): Payer: Medicaid Other

## 2021-01-11 ENCOUNTER — Other Ambulatory Visit: Payer: Self-pay | Admitting: Family Medicine

## 2021-01-11 DIAGNOSIS — M5136 Other intervertebral disc degeneration, lumbar region: Secondary | ICD-10-CM

## 2021-01-11 DIAGNOSIS — M25552 Pain in left hip: Secondary | ICD-10-CM

## 2021-01-11 DIAGNOSIS — M25551 Pain in right hip: Secondary | ICD-10-CM

## 2021-01-11 DIAGNOSIS — M545 Low back pain, unspecified: Secondary | ICD-10-CM

## 2021-01-12 ENCOUNTER — Encounter: Payer: Self-pay | Admitting: Gastroenterology

## 2021-01-17 ENCOUNTER — Encounter: Payer: Medicaid Other | Admitting: Vascular Surgery

## 2021-01-17 ENCOUNTER — Ambulatory Visit (HOSPITAL_COMMUNITY): Payer: Medicaid Other | Attending: Vascular Surgery

## 2021-01-27 ENCOUNTER — Ambulatory Visit: Payer: Medicaid Other | Admitting: Gastroenterology

## 2021-02-02 ENCOUNTER — Other Ambulatory Visit: Payer: Medicaid Other

## 2021-02-11 ENCOUNTER — Other Ambulatory Visit: Payer: Self-pay

## 2021-02-11 ENCOUNTER — Emergency Department (HOSPITAL_COMMUNITY)
Admission: EM | Admit: 2021-02-11 | Discharge: 2021-02-11 | Disposition: A | Discharge status: Home / Self Care | Payer: Medicaid Other | Attending: Emergency Medicine | Admitting: Emergency Medicine

## 2021-02-11 ENCOUNTER — Emergency Department (HOSPITAL_BASED_OUTPATIENT_CLINIC_OR_DEPARTMENT_OTHER): Payer: Medicaid Other

## 2021-02-11 ENCOUNTER — Encounter (HOSPITAL_COMMUNITY): Payer: Self-pay | Admitting: Emergency Medicine

## 2021-02-11 ENCOUNTER — Emergency Department (HOSPITAL_COMMUNITY): Payer: Medicaid Other

## 2021-02-11 DIAGNOSIS — I10 Essential (primary) hypertension: Secondary | ICD-10-CM | POA: Diagnosis not present

## 2021-02-11 DIAGNOSIS — J441 Chronic obstructive pulmonary disease with (acute) exacerbation: Secondary | ICD-10-CM | POA: Insufficient documentation

## 2021-02-11 DIAGNOSIS — R0789 Other chest pain: Secondary | ICD-10-CM | POA: Insufficient documentation

## 2021-02-11 DIAGNOSIS — Z7984 Long term (current) use of oral hypoglycemic drugs: Secondary | ICD-10-CM | POA: Diagnosis not present

## 2021-02-11 DIAGNOSIS — J45909 Unspecified asthma, uncomplicated: Secondary | ICD-10-CM | POA: Diagnosis not present

## 2021-02-11 DIAGNOSIS — M79662 Pain in left lower leg: Secondary | ICD-10-CM | POA: Insufficient documentation

## 2021-02-11 DIAGNOSIS — E119 Type 2 diabetes mellitus without complications: Secondary | ICD-10-CM | POA: Insufficient documentation

## 2021-02-11 DIAGNOSIS — Z79899 Other long term (current) drug therapy: Secondary | ICD-10-CM | POA: Diagnosis not present

## 2021-02-11 DIAGNOSIS — M7989 Other specified soft tissue disorders: Secondary | ICD-10-CM

## 2021-02-11 DIAGNOSIS — F17210 Nicotine dependence, cigarettes, uncomplicated: Secondary | ICD-10-CM | POA: Insufficient documentation

## 2021-02-11 DIAGNOSIS — R030 Elevated blood-pressure reading, without diagnosis of hypertension: Secondary | ICD-10-CM

## 2021-02-11 DIAGNOSIS — R1013 Epigastric pain: Secondary | ICD-10-CM | POA: Diagnosis not present

## 2021-02-11 LAB — CBC WITH DIFFERENTIAL/PLATELET
Abs Immature Granulocytes: 0.02 10*3/uL (ref 0.00–0.07)
Basophils Absolute: 0.1 10*3/uL (ref 0.0–0.1)
Basophils Relative: 1 %
Eosinophils Absolute: 0.1 10*3/uL (ref 0.0–0.5)
Eosinophils Relative: 1 %
HCT: 43.2 % (ref 39.0–52.0)
Hemoglobin: 14.3 g/dL (ref 13.0–17.0)
Immature Granulocytes: 0 %
Lymphocytes Relative: 23 %
Lymphs Abs: 1.5 10*3/uL (ref 0.7–4.0)
MCH: 29.7 pg (ref 26.0–34.0)
MCHC: 33.1 g/dL (ref 30.0–36.0)
MCV: 89.8 fL (ref 80.0–100.0)
Monocytes Absolute: 0.6 10*3/uL (ref 0.1–1.0)
Monocytes Relative: 10 %
Neutro Abs: 4.3 10*3/uL (ref 1.7–7.7)
Neutrophils Relative %: 65 %
Platelets: 306 10*3/uL (ref 150–400)
RBC: 4.81 MIL/uL (ref 4.22–5.81)
RDW: 17.2 % — ABNORMAL HIGH (ref 11.5–15.5)
WBC: 6.7 10*3/uL (ref 4.0–10.5)
nRBC: 0 % (ref 0.0–0.2)

## 2021-02-11 LAB — COMPREHENSIVE METABOLIC PANEL
ALT: 13 U/L (ref 0–44)
AST: 21 U/L (ref 15–41)
Albumin: 3.7 g/dL (ref 3.5–5.0)
Alkaline Phosphatase: 54 U/L (ref 38–126)
Anion gap: 10 (ref 5–15)
BUN: 12 mg/dL (ref 6–20)
CO2: 25 mmol/L (ref 22–32)
Calcium: 9.5 mg/dL (ref 8.9–10.3)
Chloride: 104 mmol/L (ref 98–111)
Creatinine, Ser: 1.28 mg/dL — ABNORMAL HIGH (ref 0.61–1.24)
GFR, Estimated: >60 mL/min (ref >60)
Glucose, Bld: 103 mg/dL — ABNORMAL HIGH (ref 70–99)
Potassium: 3.8 mmol/L (ref 3.5–5.1)
Sodium: 139 mmol/L (ref 135–145)
Total Bilirubin: 1 mg/dL (ref 0.3–1.2)
Total Protein: 7.4 g/dL (ref 6.5–8.1)

## 2021-02-11 LAB — TROPONIN I (HIGH SENSITIVITY)
Troponin I (High Sensitivity): 11 ng/L (ref <18)
Troponin I (High Sensitivity): 15 ng/L (ref <18)

## 2021-02-11 LAB — LIPASE, BLOOD: Lipase: 33 U/L (ref 11–51)

## 2021-02-11 IMAGING — DX DG CHEST 1V PORT
1 series · 1 of 1 positions shown · non-contrast
Comparison: [DATE]

CLINICAL DATA: Chest pain

EXAM:
PORTABLE CHEST 1 VIEW

[chest ap]
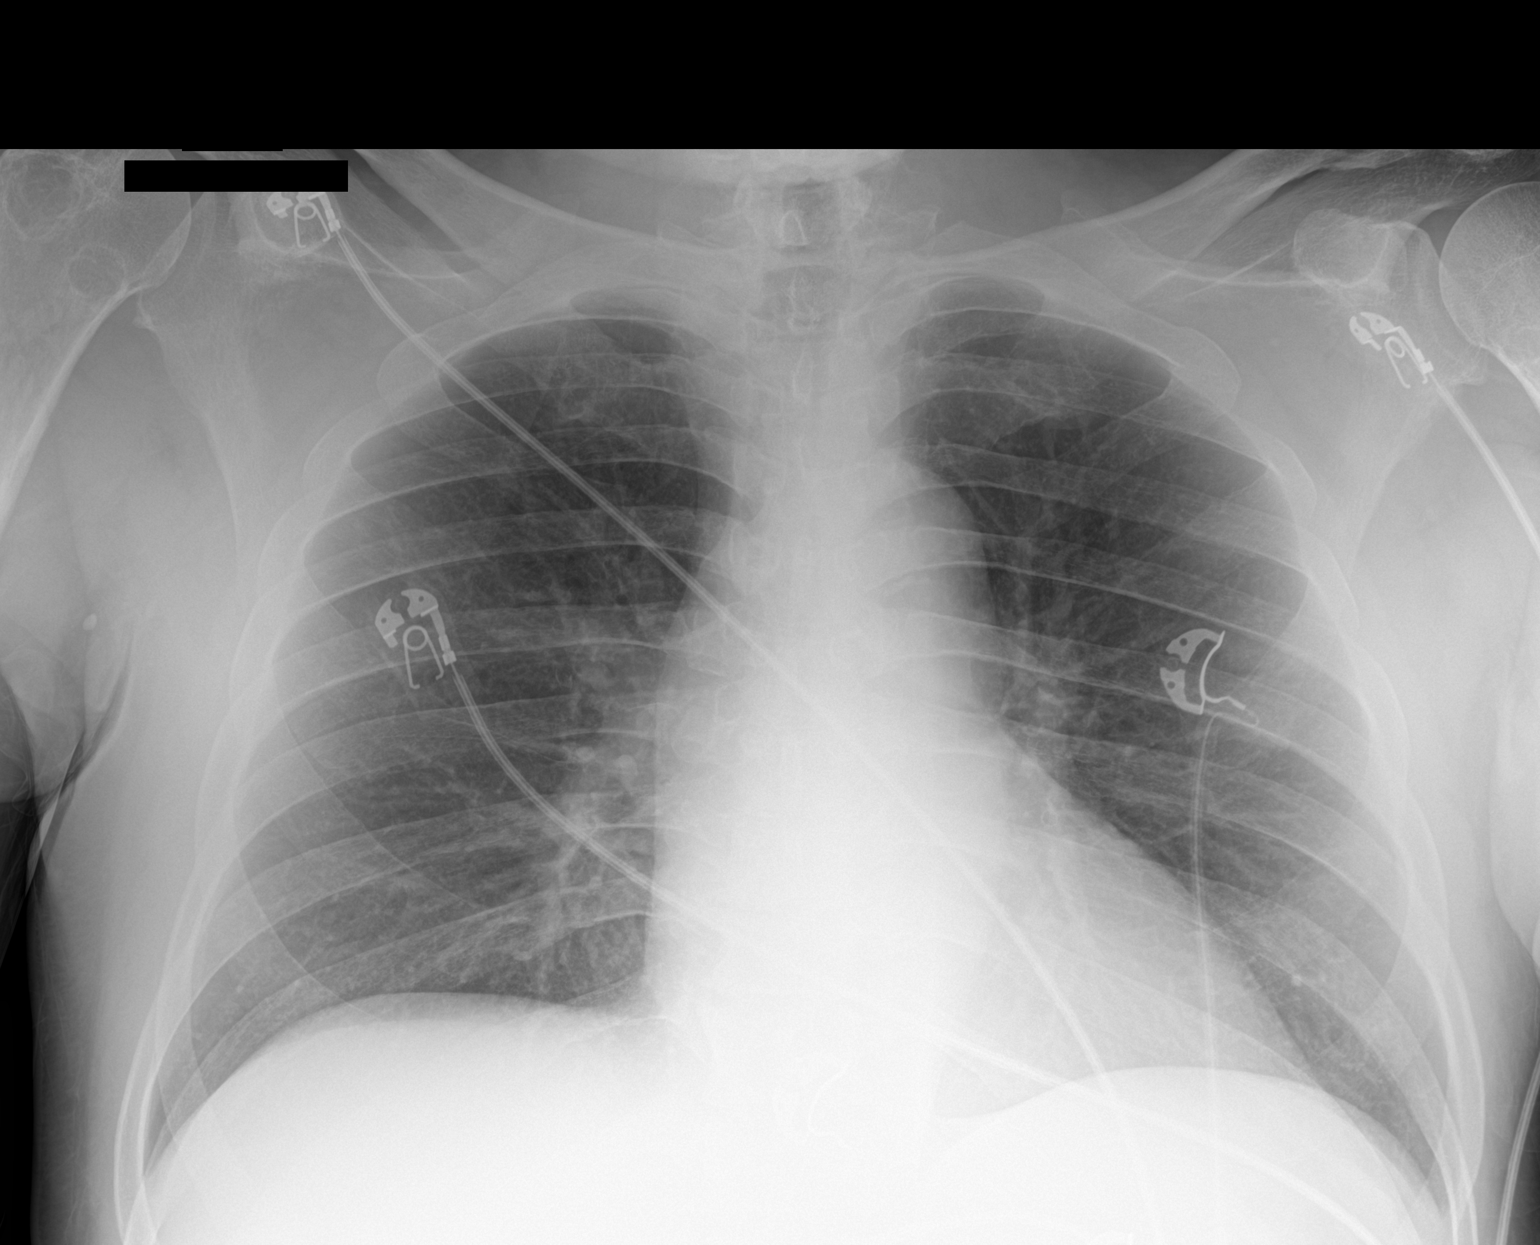

[1 of 1 positions shown; findings below may reference images not displayed]

FINDINGS: Lungs are clear. Heart size and pulmonary vascularity are normal. No
adenopathy. No pneumothorax. No bone lesions.
IMPRESSION: Lungs clear.  Cardiac silhouette normal.

## 2021-02-11 MED ORDER — LOSARTAN POTASSIUM 25 MG PO TABS
100.0000 mg | ORAL_TABLET | Freq: Once | ORAL | Status: AC
  Administered 2021-02-11: 100 mg via ORAL
  Filled 2021-02-11: qty 4

## 2021-02-11 MED ORDER — CLONIDINE HCL 0.1 MG PO TABS
0.1000 mg | ORAL_TABLET | Freq: Once | ORAL | Status: AC
  Administered 2021-02-11: 0.1 mg via ORAL
  Filled 2021-02-11: qty 1

## 2021-02-11 MED ORDER — NITROGLYCERIN 0.4 MG SL SUBL
0.4000 mg | SUBLINGUAL_TABLET | SUBLINGUAL | Status: DC | PRN
  Administered 2021-02-11: 0.4 mg via SUBLINGUAL
  Filled 2021-02-11: qty 1

## 2021-02-11 MED ORDER — NITROGLYCERIN 0.4 MG SL SUBL: 0.4000 mg | SUBLINGUAL_TABLET | SUBLINGUAL | 3 refills | Status: AC | PRN

## 2021-02-11 MED ORDER — AMLODIPINE BESYLATE 5 MG PO TABS
10.0000 mg | ORAL_TABLET | Freq: Once | ORAL | Status: AC
  Administered 2021-02-11: 10 mg via ORAL
  Filled 2021-02-11: qty 2

## 2021-02-11 MED ORDER — ASPIRIN 81 MG PO CHEW
324.0000 mg | CHEWABLE_TABLET | Freq: Once | ORAL | Status: AC
  Administered 2021-02-11: 324 mg via ORAL
  Filled 2021-02-11: qty 4

## 2021-02-11 MED ORDER — LABETALOL HCL 5 MG/ML IV SOLN
20.0000 mg | Freq: Once | INTRAVENOUS | Status: AC
  Administered 2021-02-11: 20 mg via INTRAVENOUS
  Filled 2021-02-11: qty 4

## 2021-02-11 NOTE — ED Provider Notes (Signed)
Darryl Hurley is a 60 y.o. male, presenting to the ED with chest discomfort.  When I asked the patient if he has been taking his home medications, at first he states he has been taking all of his medications, however, he then states, "that is a lot of medications to take, I'm not going to take them all if I do not need to.  I take most of them and then make sure I take all of them when I feel like I need them."  HPI from Claudette Stapler, PA-C: "Darryl Hurley is a 60 y.o. 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  Patient states chest pain feels like he is being hit by a rock in the center of his chest and worse when coughing. Episodes last a few minutes. No association with exertion or change in position. Denies fever and chills. Per chart review, he has a history of cocaine abuse; however, patient denies any drug use. Admits to chronic shortness of breath with no change from baseline.  Smokes 1 pack of cigarettes daily.  Admits to drinking 12 pack of beer daily.  Last alcoholic beverage was yesterday.  Denies recent surgeries, recent long immobilizations, lower extremity edema.  No treatment prior to arrival.  No aggravating or alleviating symptoms.  Chart reviewed. Patient was admitted to the ED on 4/14-4/15/2020 for chest pain rule out. Patient was evaluated by cardiology who thought chest pain appeared atypical for ACS.  Echocardiogram performed on 02/11/2018 demonstrated EF at 45%.  Stress test performed on 01/31/2018 with mild abnormalities.   History obtained from patient and past medical records. No interpreter used during encounter.   HPI: A 60 year old patient with a history of treated diabetes and hypertension presents for evaluation of chest pain. Initial  onset of pain was less than one hour ago. The patient's chest pain is sharp and is not worse with exertion. The patient's chest pain is middle- or left-sided, is not well-localized, is not described as heaviness/pressure/tightness and does not radiate to the arms/jaw/neck. The patient does not complain of nausea and denies diaphoresis. The patient has smoked in the past 90 days. The patient has no history of stroke, has no history of peripheral artery disease, has no relevant family history of coronary artery disease (first degree relative at less than age 59), has no history of hypercholesterolemia and does not have an elevated BMI (>=30)."      Physical Exam  BP (!) 197/112   Pulse (!) 58   Temp 98.8 F (37.1 C) (Oral)   Resp 15   Ht 5\' 5"  (1.651 m)   Wt 72.6 kg   SpO2 98%   BMI 26.63 kg/m   Physical Exam Vitals and nursing note reviewed.  Constitutional:      General: He is not in acute distress.    Appearance: He is well-developed. He is not diaphoretic.  HENT:     Head: Normocephalic and atraumatic.     Mouth/Throat:     Mouth: Mucous membranes are moist.     Pharynx: Oropharynx is clear.  Eyes:     Conjunctiva/sclera: Conjunctivae normal.  Cardiovascular:     Rate and Rhythm: Normal rate and regular rhythm.     Pulses: Normal pulses.          Radial  pulses are 2+ on the right side and 2+ on the left side.       Posterior tibial pulses are 2+ on the right side and 2+ on the left side.     Heart sounds: Normal heart sounds.     Comments: Tactile temperature in the extremities appropriate and equal bilaterally. Pulmonary:     Effort: Pulmonary effort is normal. No respiratory distress.     Breath sounds: Normal breath sounds.  Chest:     Chest wall: Tenderness present.  Abdominal:     Palpations: Abdomen is soft.     Tenderness: There is no abdominal tenderness. There is no guarding.  Musculoskeletal:     Cervical back: Neck supple.     Right lower leg: No edema.      Left lower leg: No edema.  Lymphadenopathy:     Cervical: No cervical adenopathy.  Skin:    General: Skin is warm and dry.  Neurological:     Mental Status: He is alert.     Comments: No noted acute cognitive deficit. Sensation grossly intact to light touch in the extremities.   Grip strengths equal bilaterally.   Strength 5/5 in all extremities.  No gait disturbance.  Coordination intact.  Cranial nerves III-XII grossly intact.  Handles oral secretions without noted difficulty.  No noted phonation or speech deficit. No facial droop.   Psychiatric:        Mood and Affect: Mood and affect normal.        Speech: Speech normal.        Behavior: Behavior normal.     ED Course/Procedures     Procedures  Abnormal Labs Reviewed  CBC WITH DIFFERENTIAL/PLATELET - Abnormal; Notable for the following components:      Result Value   RDW 17.2 (*)    All other components within normal limits  COMPREHENSIVE METABOLIC PANEL - Abnormal; Notable for the following components:   Glucose, Bld 103 (*)    Creatinine, Ser 1.28 (*)    All other components within normal limits    DG Chest Portable 1 View  Result Date: 02/11/2021 CLINICAL DATA:  Chest pain EXAM: PORTABLE CHEST 1 VIEW COMPARISON:  November 19, 2019 FINDINGS: Lungs are clear. Heart size and pulmonary vascularity are normal. No adenopathy. No pneumothorax. No bone lesions. IMPRESSION: Lungs clear.  Cardiac silhouette normal. Electronically Signed   By: Bretta Bang III M.D.   On: 02/11/2021 12:29   VAS [place] LOWER EXTREMITY VENOUS (DVT) ([facility] and [facility] 7a-7p)  Result Date: 02/11/2021  Lower Venous DVT Study Indications: Swelling.  Risk Factors: None identified. Comparison Study: 03/2019 Negative Performing Technologist: Clint Guy RVT  Examination Guidelines: A complete evaluation includes B-mode imaging, spectral Doppler, color Doppler, and power Doppler as needed of all accessible portions of each vessel. Bilateral testing is  considered an integral part of a complete examination. Limited examinations for reoccurring indications may be performed as noted. The reflux portion of the exam is performed with the patient in reverse Trendelenburg.  +---------+---------------+---------+-----------+----------+--------------+ LEFT     CompressibilityPhasicitySpontaneityPropertiesThrombus Aging +---------+---------------+---------+-----------+----------+--------------+ CFV      Full           Yes      Yes                                 +---------+---------------+---------+-----------+----------+--------------+ SFJ      Full                                                        +---------+---------------+---------+-----------+----------+--------------+  FV Prox  Full                                                        +---------+---------------+---------+-----------+----------+--------------+ FV Mid   Full                                                        +---------+---------------+---------+-----------+----------+--------------+ FV DistalFull                                                        +---------+---------------+---------+-----------+----------+--------------+ PFV      Full                                                        +---------+---------------+---------+-----------+----------+--------------+ POP      Full           Yes      Yes                                 +---------+---------------+---------+-----------+----------+--------------+ PTV      Full                                                        +---------+---------------+---------+-----------+----------+--------------+ PERO     Full                                                        +---------+---------------+---------+-----------+----------+--------------+     Summary: LEFT: - There is no evidence of deep vein thrombosis in the lower extremity.  - No cystic structure found in the  popliteal fossa.  *See table(s) above for measurements and observations. Electronically signed by Coral Else MD on 02/11/2021 at 4:17:06 PM.    Final     ED ECG REPORT   Date: 02/11/2021  Rate: 70  Rhythm: Sinus rhythm  QRS Axis: normal  Intervals: normal  ST/T Wave abnormalities: normal  Conduction Disutrbances:none  Narrative Interpretation:   Old EKG Reviewed: unchanged  I have personally reviewed the EKG tracing and disagree with the computerized printout as noted.             MDM   Clinical Course as of 02/11/21 1907  Sat Feb 11, 2021  1157 BP(!): 200/121 [CA]  1700 Patient reevaluated.  He states he is now chest pain-free without onset of additional symptoms following 1 dose of sublingual nitroglycerin.  Repeat blood pressure in the room 170/115. [SJ]  Clinical Course User Index [CA] Mannie StabileAberman, Caroline C, PA-C [SJ] Anselm PancoastJoy, Shawn C, PA-C   Patient care handoff report received from [facility] Aberman, [place]PA-C. Plan: Follow-up on patient's response to blood pressure medications.  Patient presented with chest pain.  His symptoms are nonexertional.  He has no additional symptoms beyond the chest discomfort.  He was having chest pain he described as a soreness in the central chest.  He does have atraumatic tenderness to the chest. His blood pressure was quite high.  He continued to have high blood pressure despite administration of his home medications and labetalol.  HEART score is 4. EKG without evidence of acute ischemia or pathologic/symptomatic arrhythmia.  Delta troponins negative. Wells criteria score is 1.5, indicating low risk for PE.   Dissection was considered, but thought less likely base on: History and description of the pain are not suggestive, patient is not ill-appearing, lack of risk factors, equal bilateral pulses, lack of neurologic deficits, no widened mediastinum on chest x-ray.  I reviewed and interpreted the patient's labs and radiological  studies.  Patient's blood pressure improved further and discomfort resolved following a single dose of sublingual nitroglycerin.  He was then observed thereafter without recurrence of discomfort. He has a follow-up appointment with his PCP scheduled for March 16 where the plan is to address the continued difficulty with controlling his hypertension.  Findings and plan of care discussed with attending physician, Adalberto Coleob Lockwood, MD.   Vitals:   02/11/21 1700 02/11/21 1715 02/11/21 1730 02/11/21 1732  BP: (!) 170/115 (!) 174/121 (!) 169/112   Pulse: 74 66 65   Resp: (!) 22 20 18    Temp:    97.6 F (36.4 C)  TempSrc:    Oral  SpO2: 98% 98% 97%   Weight:      Height:           Concepcion LivingJoy, Shawn C, PA-C 02/11/21 2031    Gerhard MunchLockwood, Robert, MD 02/11/21 2119

## 2021-02-11 NOTE — ED Triage Notes (Addendum)
Patient reports intermittent sharp central chest pain x2 weeks.

## 2021-02-11 NOTE — ED Provider Notes (Signed)
[facility]-EMERGENCY DEPT Provider Note   CSN: [id number] Arrival date & time: 02/11/21  1140     History Chief Complaint  Patient presents with  . Chest Pain    WEI POPLASKI is a 60 y.o. 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  Patient states chest pain feels like he is being hit by a rock in the center of his chest and worse when coughing. Episodes last a few minutes. No association with exertion or change in position. Denies fever and chills. Per chart review, he has a history of cocaine abuse; however, patient denies any drug use. Admits to chronic shortness of breath with no change from baseline.  Smokes 1 pack of cigarettes daily.  Admits to drinking 12 pack of beer daily.  Last alcoholic beverage was yesterday.  Denies recent surgeries, recent long immobilizations, lower extremity edema.  No treatment prior to arrival.  No aggravating or alleviating symptoms.  Chart reviewed. Patient was admitted to the ED on 4/14-4/15/2020 for chest pain rule out. Patient was evaluated by cardiology who thought chest pain appeared atypical for ACS.  Echocardiogram performed on 02/11/2018 demonstrated EF at 45%.  Stress test performed on 01/31/2018 with mild abnormalities.   History obtained from patient and past medical records. No interpreter used during encounter.   HPI: A 60 year old patient with a history of treated diabetes and hypertension presents for evaluation of chest pain. Initial onset of pain was less than one hour ago. The patient's chest pain is sharp and is not worse with exertion. The patient's chest pain is middle- or left-sided, is not well-localized, is not described as heaviness/pressure/tightness and does not radiate to the  arms/jaw/neck. The patient does not complain of nausea and denies diaphoresis. The patient has smoked in the past 90 days. The patient has no history of stroke, has no history of peripheral artery disease, has no relevant family history of coronary artery disease (first degree relative at less than age 63), has no history of hypercholesterolemia and does not have an elevated BMI (>=30).   Past Medical History:  Diagnosis Date  . Alcohol abuse   . Anemia   . Angina pectoris   . Arthritis   . Asthma   . Chronic lower back pain   . COPD (chronic obstructive pulmonary disease) (HCC)    on home O2 2L  . Diabetes mellitus without complication (HCC)   . DVT (deep venous thrombosis) (HCC)    bilateral  . Exertional shortness of breath   . Gout   . Hypertension   . Seizures [facility])     Patient Active Problem List   Diagnosis Date Noted  . Polyarthralgia 11/19/2019  . Sepsis (HCC) 11/19/2019  . Septic arthritis (HCC) 11/19/2019  . 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 Hyponatremia 11/19/2019  . Alcohol abuse 11/19/2019  . Lactic acidosis 01/06/2016  . Lactic acid acidosis 01/06/2016  . Alcohol abuse with intoxication (HCC) 01/06/2016  . COPD with acute exacerbation (HCC) 02/02/2014  . HCAP (healthcare-associated pneumonia) 02/02/2014  . Increased anion gap metabolic acidosis 02/02/2014  . Cocaine abuse (HCC) 11/18/2013  . Fever 11/17/2013  . Anemia 11/17/2013  . Hypokalemia 11/17/2013  . Polyarthritis 11/17/2013  . Dehydration 11/17/2013  . Type 2 diabetes mellitus (HCC) 05/14/2013  . Chronic obstructive pulmonary disease (  COPD) (HCC) 05/12/2013  . Gout 05/12/2013  . Headache 05/12/2013  . Asthma 05/04/2013  . TOBACCO ABUSE 04/27/2009  . Essential hypertension 04/27/2009    Past Surgical History:  Procedure Laterality Date  . INCISION AND DRAINAGE ABSCESS Bilateral 11/19/2019   Procedure: INCISION AND DRAINAGE BILATERAL SEPTIC KNEES, ASPIRATION OF LEFT KNEE;  Surgeon: Roby LoftsHaddix, Kevin  P, MD;  Location: [facility] OR;  Service: Orthopedics;  Laterality: Bilateral;  . INGUINAL HERNIA REPAIR Bilateral 09/01/2020   Procedure: BILATERAL LAPAROSCOPIC INGUINAL HERNIA REPAIR WITH MESH;  Surgeon: Abigail MiyamotoBlackman, Douglas, MD;  [facility];  Service: General;  Laterality: Bilateral;  . INSERTION OF MESH Bilateral 09/01/2020   Procedure: INSERTION OF MESH;  Surgeon: Abigail MiyamotoBlackman, Douglas, MD;  [facility];  Service: General;  Laterality: Bilateral;  . NO PAST SURGERIES         Family History  Problem Relation Age of Onset  . Diabetes type II Mother   . Depression Father   . Suicidality Father   . Asthma Brother     Social History   Tobacco Use  . Smoking status: Current Every Day Smoker    Packs/day: 0.50    Years: 40.00    Pack years: 20.00    Types: Cigarettes  . Smokeless tobacco: Never Used  . Tobacco comment: 05/12/2013 'eased off smoking since last month"  Vaping Use  . Vaping Use: Never used  Substance Use Topics  . Alcohol use: Yes    Comment: 2 pts of beer a night, 2 pts of liquor a week  . Drug use: No    Types: "Crack" cocaine    Home Medications Prior to Admission medications   Medication Sig Start Date End Date Taking? Authorizing Provider  acetaminophen (TYLENOL) 325 MG tablet Take 2 tablets (650 mg total) by mouth every 6 (six) hours as needed. 05/20/19   Lamptey, Britta MccreedyPhilip O, MD  albuterol (PROVENTIL HFA;VENTOLIN HFA) 108 (90 BASE) MCG/ACT inhaler Inhale 1 puff into the lungs every 6 (six) hours as needed for wheezing or shortness of breath.    [provider]  allopurinol (ZYLOPRIM) 100 MG tablet Take 100 mg by mouth daily.    [provider]  amLODipine (NORVASC) 10 MG tablet Take 10 mg by mouth daily. 08/06/18   [provider]  cloNIDine (CATAPRES) 0.1 MG tablet Take 0.1 mg by mouth 2 (two) times daily. 08/15/18   [provider]  colchicine 0.6 MG tablet take 2 tablets (1.2 mg) by oral route initially, then take 1 tab (0.6 mg)  in 1 hr as needed for gout 08/15/20   [provider]  FLOVENT HFA 110 MCG/ACT inhaler Inhale 1 puff into the lungs 2 (two) times daily as needed (shortness of breath).  08/06/18   [provider]  gabapentin (NEURONTIN) 300 MG capsule Take 300 mg by mouth 2 (two) times daily. 03/04/19   [provider]  gabapentin (NEURONTIN) 600 MG tablet Take 600 mg by mouth 3 (three) times daily. 09/12/20   [provider]  hydrochlorothiazide (HYDRODIURIL) 25 MG tablet Take 25 mg by mouth daily. 05/27/20   [provider]  ketoconazole (NIZORAL) 2 % cream Apply 1 application topically daily. 11/10/20   McDonald, Rachelle HoraAdam R, DPM  lisinopril (ZESTRIL) 20 MG tablet Take 20 mg by mouth daily.    [provider]  losartan (COZAAR) 100 MG tablet Take 100 mg by mouth daily. 08/13/20   [provider]  metFORMIN (GLUCOPHAGE) 1000 MG tablet Take 1,000 mg by  mouth 2 (two) times daily. 05/17/20   [provider]  methocarbamol (ROBAXIN) 500 MG tablet Take 1 tablet (500 mg total) by mouth 2 (two) times daily. 06/08/20   Khatri, Hina, PA-C  naproxen (NAPROSYN) 500 MG tablet SMARTSIG:1 Tablet(s) By Mouth Every 12 Hours PRN 10/10/20   [provider]  nitroGLYCERIN (NITROSTAT) 0.4 MG SL tablet Place 1 tablet (0.4 mg total) under the tongue every 5 (five) minutes x 3 doses as needed for chest pain. 03/18/19   Patwardhan, Anabel Bene, MD  oxyCODONE (OXY IR/ROXICODONE) 5 MG immediate release tablet Take 1 tablet (5 mg total) by mouth every 6 (six) hours as needed for moderate pain or severe pain. 09/01/20   Abigail Miyamoto, MD  oxyCODONE (OXY IR/ROXICODONE) 5 MG immediate release tablet Take 1 tablet (5 mg total) by mouth every 6 (six) hours as needed for moderate pain, severe pain or breakthrough pain. 09/01/20   Abigail Miyamoto, MD  oxyCODONE (OXY IR/ROXICODONE) 5 MG immediate release tablet Take 1 tablet (5 mg total) by mouth every 6 (six) hours as needed for  moderate pain, severe pain or breakthrough pain. 09/01/20   Abigail Miyamoto, MD  oxyCODONE (OXY IR/ROXICODONE) 5 MG immediate release tablet Take 1 tablet (5 mg total) by mouth every 6 (six) hours as needed for moderate pain, severe pain or breakthrough pain. 09/01/20   Abigail Miyamoto, MD  rosuvastatin (CRESTOR) 40 MG tablet Take 40 mg by mouth daily.    [provider]  SYMBICORT 160-4.5 MCG/ACT inhaler Inhale 2 puffs into the lungs 2 (two) times daily. 11/05/19   [provider]  tiZANidine (ZANAFLEX) 4 MG capsule Take 4 mg by mouth as needed for muscle spasms.  06/25/18   [provider]  traZODone (DESYREL) 50 MG tablet Take 1 tablet (50 mg total) by mouth at bedtime as needed for sleep. 05/20/19   Lamptey, Britta Mccreedy, MD  labetalol (NORMODYNE) 200 MG tablet Take 1 tablet (200 mg total) by mouth 2 (two) times daily. Patient not taking: Reported on 03/25/2019 02/16/19 05/20/19  Elder Negus, MD    Allergies    Nsaids and Toradol [ketorolac tromethamine]  Review of Systems   Review of Systems  Constitutional: Negative for chills and fever.  Respiratory: Positive for shortness of breath (chronic).   Cardiovascular: Positive for chest pain. Negative for leg swelling.  Gastrointestinal: Negative for abdominal pain, diarrhea, nausea and vomiting.  All other systems reviewed and are negative.   Physical Exam Updated Vital Signs BP (!) 208/126   Pulse 62   Temp 98.8 F (37.1 C) (Oral)   Resp (!) 23   Ht 5\' 5"  (1.651 m)   Wt 72.6 kg   SpO2 95%   BMI 26.63 kg/m   Physical Exam Vitals and nursing note reviewed.  Constitutional:      General: He is not in acute distress.    Appearance: He is not ill-appearing.  HENT:     Head: Normocephalic.  Eyes:     Pupils: Pupils are equal, round, and reactive to light.  Cardiovascular:     Rate and Rhythm: Normal rate and regular rhythm.     Pulses: Normal pulses.     Heart sounds: Normal heart sounds. No  murmur heard. No friction rub. No gallop.   Pulmonary:     Effort: Pulmonary effort is normal.     Breath sounds: Normal breath sounds.  Chest:     Comments: Reproducible anterior chest wall tenderness. No crepitus or deformity.  Abdominal:     General: Abdomen is flat. Bowel sounds are normal. There is no distension.     Palpations: Abdomen is soft.     Tenderness: There is abdominal tenderness. There is guarding. There is no rebound.     Comments: Tenderness in epigastric region with voluntary guarding.   Musculoskeletal:     Cervical back: Neck supple.     Comments: Tenderness to left calf. No lower extremity edema bilaterally.  Skin:    General: Skin is warm and dry.  Neurological:     General: No focal deficit present.  Psychiatric:        Mood and Affect: Mood normal.        Behavior: Behavior normal.     ED Results / Procedures / Treatments   Labs (all labs ordered are listed, but only abnormal results are displayed) Labs Reviewed  CBC WITH DIFFERENTIAL/PLATELET - Abnormal; Notable for the following components:      Result Value   RDW 17.2 (*)    All other components within normal limits  COMPREHENSIVE METABOLIC PANEL - Abnormal; Notable for the following components:   Glucose, Bld 103 (*)    Creatinine, Ser 1.28 (*)    All other components within normal limits  LIPASE, BLOOD  URINALYSIS, ROUTINE W REFLEX MICROSCOPIC  TROPONIN I (HIGH SENSITIVITY)  TROPONIN I (HIGH SENSITIVITY)    EKG None  Radiology DG Chest Portable 1 View  Result Date: 02/11/2021 CLINICAL DATA:  Chest pain EXAM: PORTABLE CHEST 1 VIEW COMPARISON:  November 19, 2019 FINDINGS: Lungs are clear. Heart size and pulmonary vascularity are normal. No adenopathy. No pneumothorax. No bone lesions. IMPRESSION: Lungs clear.  Cardiac silhouette normal. Electronically Signed   By: Bretta Bang III M.D.   On: 02/11/2021 12:29   VAS [place] LOWER EXTREMITY VENOUS (DVT) ([facility] 7a-7p)  Result Date:  02/11/2021  Lower Venous DVT Study Indications: Swelling.  Risk Factors: None identified. Comparison Study: 03/2019 Negative Performing Technologist: Clint Guy RVT  Examination Guidelines: A complete evaluation includes B-mode imaging, spectral Doppler, color Doppler, and power Doppler as needed of all accessible portions of each vessel. Bilateral testing is considered an integral part of a complete examination. Limited examinations for reoccurring indications may be performed as noted. The reflux portion of the exam is performed with the patient in reverse Trendelenburg.  +---------+---------------+---------+-----------+----------+--------------+ LEFT     CompressibilityPhasicitySpontaneityPropertiesThrombus Aging +---------+---------------+---------+-----------+----------+--------------+ CFV      Full           Yes      Yes                                 +---------+---------------+---------+-----------+----------+--------------+ SFJ      Full                                                        +---------+---------------+---------+-----------+----------+--------------+ FV Prox  Full                                                        +---------+---------------+---------+-----------+----------+--------------+ FV Mid   Full                                                        +---------+---------------+---------+-----------+----------+--------------+  FV DistalFull                                                        +---------+---------------+---------+-----------+----------+--------------+ PFV      Full                                                        +---------+---------------+---------+-----------+----------+--------------+ POP      Full           Yes      Yes                                 +---------+---------------+---------+-----------+----------+--------------+ PTV      Full                                                         +---------+---------------+---------+-----------+----------+--------------+ PERO     Full                                                        +---------+---------------+---------+-----------+----------+--------------+     Summary: LEFT: - There is no evidence of deep vein thrombosis in the lower extremity.  - No cystic structure found in the popliteal fossa.  *See table(s) above for measurements and observations.    Preliminary     Procedures Procedures   Medications Ordered in ED Medications  labetalol (NORMODYNE) injection 20 mg (has no administration in time range)  cloNIDine (CATAPRES) tablet 0.1 mg (has no administration in time range)  amLODipine (NORVASC) tablet 10 mg (has no administration in time range)  losartan (COZAAR) tablet 100 mg (has no administration in time range)  aspirin chewable tablet 324 mg (324 mg Oral Given 02/11/21 1218)  labetalol (NORMODYNE) injection 20 mg (20 mg Intravenous Given 02/11/21 1227)    ED Course  I have reviewed the triage vital signs and the nursing notes.  Pertinent labs & imaging results that were available during my care of the patient were reviewed by me and considered in my medical decision making (see chart for details).  Clinical Course as of 02/11/21 1454  Sat Feb 11, 2021  1157 BP(!): 200/121 [CA]    Clinical Course User Index [CA] Mannie Stabile, PA-C   MDM Rules/Calculators/A&P HEAR Score: 38                       60 year old male presents to the ED due to substernal chest pain x 2 weeks. Previous history of DVT not currently on any anticoagulants. Upon arrival, patient hypertensive at 200/121, but otherwise unremarkable vitals. Patient states he has not taken his BP medications today. Patient in no acute distress and non-toxic appearing.  Physical exam significant for reproducible central anterior chest wall tenderness.  No crepitus or deformity.  Tenderness in epigastric region with voluntary guarding.  Tenderness  throughout left calf.  No lower extremity edema bilaterally. Troponin and EKG to rule out ACS. [place] to rule out DVT of LLE due to previous history and calf tenderness. Routine labs to rule out end organ damage. IV labetalol given. ASA given since patient is still having chest pain during my initial evaluation. Discussed case with Dr. Lynelle Doctor who agrees with assessment and plan.   CBC reassuring with no leukocytosis and normal hemoglobin.  Lipase normal at 33.  Doubt pancreatitis.  Troponin normal at 11.  Will obtain delta troponin rule out ACS.  CMP significant for elevated creatinine at 1.28 and hyperglycemia at 103.  No anion gap.  No major electrolyte derangements.  Chest x-ray personally reviewed which is negative for any signs of pneumonia, pneumothorax, or widened mediastinum.  EKG personally reviewed which demonstrates normal sinus rhythm with no signs of acute ischemia. [place] personally reviewed which is negative for DVT.   2:30 PM patient notes he has had a few episodes of chest pain while here in the ED. He notes his chest pain is only when coughing. BP still continues to be elevated. Another dose of labetalol given in addition to patient's home medications per last cardiology note.   Delta troponin flat. Low suspicion for ACS. Doubt PE, dissection, and other emergent causes of chest pain given reproducible nature on exam and pain is only present when coughing.   Patient handed off to [facility], PA-C at shift change pending blood pressure control. If BP is controlled, he may be discharged home with cardiology follow-up. If BP remains elevated and patient continues to have episodes of chest pain, he will need to be admitted to the hospital.  Final Clinical Impression(s) / ED Diagnoses Final diagnoses:  Chest wall pain  Elevated blood pressure reading    Rx / DC Orders ED Discharge Orders    None       Jesusita Oka 02/11/21 1529    Linwood Dibbles, MD 02/12/21 [phone]

## 2021-02-11 NOTE — Progress Notes (Signed)
Left lower extremity venous study completed.   Results given to Physician  Please see CV Proc for preliminary results.   Clint Guy, RVT

## 2021-02-11 NOTE — Discharge Instructions (Addendum)
Please be sure to take each of your prescribed medications, as prescribed, every day. Use the nitroglycerin tablet, placed underneath the tongue, as needed for chest discomfort.  Should chest pain persist after 3 doses of nitroglycerin, please call 911 or come to the emergency department.  Please be sure to keep your already scheduled appointment with your primary care provider on March 16.  Please also make them aware you were seen in the emergency department.  Return to the emergency department for persistent chest pain, dizziness, shortness of breath, abdominal pain, numbness, weakness, or any other major concerns.

## 2021-02-21 ENCOUNTER — Ambulatory Visit: Payer: Medicaid Other | Admitting: Cardiology

## 2021-02-21 ENCOUNTER — Encounter: Payer: Self-pay | Admitting: Cardiology

## 2021-02-21 VITALS — BP 166/100 | HR 74 | Temp 98.0°F | Resp 16 | Ht 65.0 in | Wt 190.0 lb

## 2021-02-21 DIAGNOSIS — I209 Angina pectoris, unspecified: Secondary | ICD-10-CM

## 2021-02-21 DIAGNOSIS — F17200 Nicotine dependence, unspecified, uncomplicated: Secondary | ICD-10-CM

## 2021-02-21 DIAGNOSIS — I10 Essential (primary) hypertension: Secondary | ICD-10-CM

## 2021-02-21 DIAGNOSIS — F1010 Alcohol abuse, uncomplicated: Secondary | ICD-10-CM

## 2021-02-21 DIAGNOSIS — E1142 Type 2 diabetes mellitus with diabetic polyneuropathy: Secondary | ICD-10-CM

## 2021-02-21 NOTE — Patient Instructions (Signed)
Please remind the patient to bring the caridac medication bottles at the next office visit to help facilitate a more accurate medication reconciliation.

## 2021-02-21 NOTE — Progress Notes (Signed)
Date:  02/21/2021   ID:  [name], DOB [birth year], MRN [id number]  PCP:  [facility]  Primary Cardiology Providers: Truett Mainland ,MD   Date: 02/21/21 Last Office Visit: 08/07/2019  Chief Complaint  Patient presents with  . Chest Pain  . Hypertension  . Hospitalization Follow-up  . New Patient (Initial Visit)    HPI  Darryl Hurley is a 60 y.o. male who presents to the office with a chief complaint of " reevaluation of chest pain, status post ER visit." Patient's past medical history and cardiovascular risk factors include: hypertension, type 2 diabetes mellitus, tobacco and alcohol abuse, gout, mild reduced LVEF, history of DVT, hx of cocaine use.   Patient follows Dr. Truett Mainland in our practice and is seen today by myself as a sick visit for reevaluation of chest pain and recent ER visit.  Patient states that approximately 2 weeks ago he started having chest discomfort that was located substernally, intensity 9 out of 10, at times worse with coughing and physical exertion, and improving with resting.  And therefore he went to hospital for further evaluation and management on March 12th 2022.  During the ER visit he had high sensitive troponins which were noted to be within normal limits and EKG did not show any underlying injury pattern and was later discharged home with sublingual nitroglycerin tablets.  Patient states that he took sublingual nitroglycerin tablets for 2 days and 2 pills each day.  And after that his chest pain has resolved.  At the time of the evaluation he has no chest discomfort.  He is not requiring any nitroglycerin tablets since then.  Patient states that he is also decreased the amount of smoking.  He used to smoke 1 pack/day every day until his recent hospitalization.  He now smokes 1 to 2 cigarettes/day.  He continues to have excessive amount of alcohol consumption.  2 days ago on Sunday he states that he had 2  pints of liquor.  No recent use of recreational drugs such as cocaine.  However, in the past he has tried cocaine.  FUNCTIONAL STATUS: No structured exercise program or daily routine.   ALLERGIES: Allergies  Allergen Reactions  . Nsaids Other (See Comments)    Vagale down reaction  . Toradol [Ketorolac Tromethamine]     vagale down reaction    MEDICATION LIST PRIOR TO VISIT: Current Meds  Medication Sig  . acetaminophen (TYLENOL) 325 MG tablet Take 2 tablets (650 mg total) by mouth every 6 (six) hours as needed. (Patient taking differently: Take 325 mg by mouth every 6 (six) hours as needed for moderate pain.)  . albuterol (PROVENTIL HFA;VENTOLIN HFA) 108 (90 BASE) MCG/ACT inhaler Inhale 1 puff into the lungs every 6 (six) hours as needed for wheezing or shortness of breath.  . allopurinol (ZYLOPRIM) 300 MG tablet Take 300 mg by mouth daily.  Marland Kitchen amLODipine (NORVASC) 10 MG tablet Take 10 mg by mouth daily.  . cloNIDine (CATAPRES) 0.1 MG tablet Take 0.1 mg by mouth 2 (two) times daily.  . colchicine 0.6 MG tablet Take 0.6 mg by mouth every other day.  Marland Kitchen FLOVENT HFA 110 MCG/ACT inhaler Inhale 1 puff into the lungs 2 (two) times daily as needed (shortness of breath).   . gabapentin (NEURONTIN) 600 MG tablet Take 600 mg by mouth 3 (three) times daily.  Marland Kitchen ketoconazole (NIZORAL) 2 % cream Apply 1 application topically daily.  . naproxen (NAPROSYN) 500 MG tablet  Take 500 mg by mouth daily.  . nitroGLYCERIN (NITROSTAT) 0.4 MG SL tablet Place 1 tablet (0.4 mg total) under the tongue every 5 (five) minutes x 3 doses as needed for chest pain.  Marland Kitchen oxyCODONE (OXY IR/ROXICODONE) 5 MG immediate release tablet Take 1 tablet (5 mg total) by mouth every 6 (six) hours as needed for moderate pain or severe pain.  . rosuvastatin (CRESTOR) 40 MG tablet Take 40 mg by mouth daily.  . SYMBICORT 160-4.5 MCG/ACT inhaler Inhale 2 puffs into the lungs 2 (two) times daily.  Marland Kitchen tiZANidine (ZANAFLEX) 4 MG capsule Take 4  mg by mouth daily as needed for muscle spasms.  . traZODone (DESYREL) 50 MG tablet Take 1 tablet (50 mg total) by mouth at bedtime as needed for sleep.     PAST MEDICAL HISTORY: Past Medical History:  Diagnosis Date  . Alcohol abuse   . Anemia   . Angina pectoris   . Arthritis   . Asthma   . Chronic lower back pain   . COPD (chronic obstructive pulmonary disease) (HCC)    on home O2 2L  . Diabetes mellitus without complication (HCC)   . DVT (deep venous thrombosis) (HCC)    bilateral  . Exertional shortness of breath   . Gout   . Hypertension   . Seizures (HCC)     PAST SURGICAL HISTORY: Past Surgical History:  Procedure Laterality Date  . INCISION AND DRAINAGE ABSCESS Bilateral 11/19/2019   Procedure: INCISION AND DRAINAGE BILATERAL SEPTIC KNEES, ASPIRATION OF LEFT KNEE;  Surgeon: Roby Lofts, MD;  Location: [facility] OR;  Service: Orthopedics;  Laterality: Bilateral;  . INGUINAL HERNIA REPAIR Bilateral 09/01/2020   Procedure: BILATERAL LAPAROSCOPIC INGUINAL HERNIA REPAIR WITH MESH;  Surgeon: Abigail Miyamoto, MD;  Location[facility] OR;  Service: General;  Laterality: Bilateral;  . INSERTION OF MESH Bilateral 09/01/2020   Procedure: INSERTION OF MESH;  Surgeon: Abigail Miyamoto, MD;  Location: [facility] OR;  Service: General;  Laterality: Bilateral;  . NO PAST SURGERIES      FAMILY HISTORY: The patient family history includes Asthma in his brother; Depression in his father; Diabetes type II in his mother; Suicidality in his father.  SOCIAL HISTORY:  The patient  reports that he has been smoking cigarettes. He has a 20.00 pack-year smoking history. He has never used smokeless tobacco. He reports current alcohol use. He reports that he does not use drugs.  REVIEW OF SYSTEMS: Review of Systems  Constitutional: Negative for chills and fever.  HENT: Negative for hoarse voice and nosebleeds.   Eyes: Negative for discharge, double vision and pain.  Cardiovascular: Negative for chest pain,  claudication, dyspnea on exertion, leg swelling, near-syncope, orthopnea, palpitations, paroxysmal nocturnal dyspnea and syncope.  Respiratory: Negative for hemoptysis and shortness of breath.   Musculoskeletal: Positive for arthritis and joint pain (left knee pain). Negative for muscle cramps and myalgias.  Gastrointestinal: Negative for abdominal pain, constipation, diarrhea, hematemesis, hematochezia, melena, nausea and vomiting.  Neurological: Negative for dizziness and light-headedness.    PHYSICAL EXAM: Vitals with BMI 02/21/2021 02/21/2021 02/11/2021  Height - 5\' 5"  -  Weight - 190 lbs -  BMI - 31.62 -  Systolic 166 168  Diastolic 100 108 431  Pulse 74 79 65    CONSTITUTIONAL: Appears older than stated age, ambulates with a cane, hemodynamically stable, no acute distress.    SKIN: Skin is warm and dry. No rash noted. No cyanosis. No pallor. No jaundice HEAD: Normocephalic and atraumatic.  EYES: No scleral icterus MOUTH/THROAT: Moist oral membranes.  NECK: No JVD present. No thyromegaly noted. No carotid bruits  LYMPHATIC: No visible cervical adenopathy.  CHEST Normal respiratory effort. No intercostal retractions  LUNGS: Clear to auscultation bilaterally.  no stridor. No wheezes. No rales.  CARDIOVASCULAR: Regular rate and rhythm, no murmurs rubs or gallops appreciated. ABDOMINAL: No apparent ascites.  EXTREMITIES: No peripheral edema  HEMATOLOGIC: No significant bruising NEUROLOGIC: Oriented to person, place, and time. Nonfocal. Normal muscle tone.  PSYCHIATRIC: Normal mood and affect. Normal behavior. Cooperative  CARDIAC DATABASE: EKG: 02/21/2021: Normal sinus rhythm, 71 bpm, normal axis, nonspecific T wave abnormality in the high lateral and lateral leads.  Without underlying injury pattern.  Echocardiogram: 03/18/2019:The left ventricle has mildly reduced systolic function, with an ejection fraction of 45-50%. The cavity size was normal. There is mild concentric left  ventricular hypertrophy. Left ventricular diastolic Doppler parameters are consistent with impaired relaxation. No significant valvular abnormality. No significant change compared to previous study on 02/12/2018.    Stress Testing: Lexiscan myoview stress test 01/31/2018:  1. Pharmacologic stress testing was performed with intravenous administration of .4 mg of Lexiscan over a 10-15 seconds infusion. Patient was hypertensive throughout the study, peak BP 200/120 mmHg. Stress symptoms included dyspnea, dizziness.  2. Exercise capacity not assessed. Stress EKG is non diagnostic for ischemia as it is a pharmacologic stress.  3. The overall quality of the study is good. Left ventricular cavity is noted to be normal on the rest and stress studies. Gated SPECT images reveal normal myocardial thickening and wall motion. The left ventricular ejection fraction was calculated or visually estimated to be 41%. SPECT images demonstrate small perfusion abnormality of mild intensity in the mid inferolateral myocardial wall(s) on the stress images with mild reversibility on rest images.  4. Low to intermediate risk study.   Heart Catheterization: None  RLE venous duplex US 09/2018: Final Interpretation: Right: Findings consistent with acute deep vein thrombosis involving the right peroneal vein, and right posterior tibial vein. No cystic structure found in the popliteal fossa. Left: No evidence of common femoral vein obstruction  Left lower extremity venous duplex 02/11/2021: LEFT:  - There is no evidence of deep vein thrombosis in the lower extremity.  - No cystic structure found in the popliteal fossa.   LABORATORY DATA: CBC Latest Ref Rng & Units 02/11/2021 09/01/2020 06/10/2020  WBC 4.0 - 10.5 K/uL 6.7 6.6 10.0  Hemoglobin 13.0 - 17.0 g/dL 27.0 62.3 76.2  Hematocrit 39.0 - 52.0 % 43.2 47.0 43.8  Platelets 150 - 400 K/uL 306 325 362    CMP Latest Ref Rng & Units 02/11/2021 09/01/2020 06/10/2020   Glucose 70 - 99 mg/dL 831(D) 95 176(H)  BUN 6 - 20 mg/dL 12 20 60(V)  Creatinine 0.61 - 1.24 mg/dL 3.71(G) 6.26(R) 4.85(I)  Sodium 135 - 145 mmol/L 139 135 138  Potassium 3.5 - 5.1 mmol/L 3.8 4.7 3.9  Chloride 98 - 111 mmol/L 104 101 104  CO2 22 - 32 mmol/L 25 22 24   Calcium 8.9 - 10.3 mg/dL 9.5 9.7 9.5  Total Protein 6.5 - 8.1 g/dL 7.4 7.6 8.1  Total Bilirubin 0.3 - 1.2 mg/dL 1.0 0.8 1.2  Alkaline Phos 38 - 126 U/L 54 60 59  AST 15 - 41 U/L 21 21 20   ALT 0 - 44 U/L 13 12 14     Lipid Panel     Component Value Date/Time   CHOL 193 03/18/2019 0504   TRIG 145 03/18/2019 0504  HDL 51 03/18/2019 0504   CHOLHDL 3.8 03/18/2019 0504   VLDL 29 03/18/2019 0504   LDLCALC 113 (H) 03/18/2019 0504    No components found for: NTPROBNP No results for input(s): PROBNP in the last 8760 hours. No results for input(s): TSH in the last 8760 hours.  BMP Recent Labs    03/25/20 1022 06/10/20 1039 09/01/20 1229 02/11/21 1200  NA 138 138 135 139  K 4.0 3.9 4.7 3.8  CL 102 104 101 104  CO2 24 24 22 25   GLUCOSE 123* 112* 95 103*  BUN 10 23* 20 12  CREATININE 1.11 1.44* 1.41* 1.28*  CALCIUM 9.7 9.5 9.7 9.5  GFRNONAA >60 54* 55* >60  GFRAA >60 >60 >60  --     HEMOGLOBIN A1C Lab Results  Component Value Date   HGBA1C 6.9 (H) 11/20/2019   MPG 151.33 11/20/2019    IMPRESSION:    ICD-10-CM   1. Angina pectoris (HCC)  I20.9 PCV ECHOCARDIOGRAM COMPLETE    PCV MYOCARDIAL PERFUSION WITH LEXISCAN  2. Uncontrolled hypertension  I10 EKG 12-Lead  3. Alcohol abuse  F10.10   4. Type 2 diabetes mellitus with diabetic polyneuropathy, without long-term current use of insulin (HCC)  E11.42   5. TOBACCO ABUSE  F17.200      RECOMMENDATIONS: Daine Florasnthony R Kyllo is a 60 y.o. male whose past medical history and cardiac risk factors include: hypertension, type 2 diabetes mellitus, tobacco and alcohol abuse, gout, mild reduced LVEF, history of DVT, hx of cocaine use.   Anginal chest pain:  Sick  visit, recent hospitalization  His discomfort 2 weeks ago suggestive of angina pectoris.    Hospital records reviewed.  High sensitive troponins negative x2, EKG did not show underlying injury pattern.  He was given sublingual nitroglycerin tablets to use on as needed basis.  Patient states that he used the sublingual nitroglycerin tablets for 2 days no more than 2 pills/day and his symptoms have resolved.  At today's office visit he is chest pain-free.  Patient also informs me that he has significantly reduced his smoking.  Was smoking 1 pack/day and now down to 1 to 2 cigarettes/day.  He has noticed a significant improvement in his symptoms.  In addition, patient's blood pressure during initial office visit was uncontrolled however by the end of the office visit his blood pressure did improve but still not within acceptable range.  Unable to uptitrate medical therapy as he does not know what medications he takes, does not have a list of medication bottles with him.  Patient also states that he did not take his morning medications prior to coming to the office.  Although I cannot rule out CAD the discomfort 2 weeks ago may have been exacerbated by superimposed vasospasm from the amount of smoking that he does on a regular basis as well as uncontrolled hypertension.  I have educated him on the importance of blood pressure management.  His last ischemic evaluation was in 2019; therefore, we discussed undergoing coronary CTA but patient refuses at the current time but is willing to undergo stress testing.  Plan echocardiogram and nuclear stress test.  I have asked him to bring his medication bottles in at the next office visit for medication reconciliation.  Recommended follow-up in 4 weeks or sooner after the completion of echo cardiogram and stress testing with his primary cardiologist Dr. Truett MainlandManish Patwardhan.  Benign essential hypertension: As discussed above.  Cigarette smoking  Tobacco  cessation counseling:  Currently smoking 1-2 cigarettes/day  Patient was informed of the dangers of tobacco abuse including stroke, cancer, and MI, as well as benefits of tobacco cessation.  Patient is  willing to quit at this time.  Approximately 7 mins were spent counseling patient cessation techniques. We discussed various methods to help quit smoking, including deciding on a date to quit, joining a support group, pharmacological agents- nicotine gum/patch/lozenges, chantix.   I will reassess his progress at the next follow-up visit  FINAL MEDICATION LIST END OF ENCOUNTER: No orders of the defined types were placed in this encounter.   Medications Discontinued During This Encounter  Medication Reason  . oxyCODONE (OXY IR/ROXICODONE) 5 MG immediate release tablet Error  . oxyCODONE (OXY IR/ROXICODONE) 5 MG immediate release tablet Error  . oxyCODONE (OXY IR/ROXICODONE) 5 MG immediate release tablet Error     Current Outpatient Medications:  .  acetaminophen (TYLENOL) 325 MG tablet, Take 2 tablets (650 mg total) by mouth every 6 (six) hours as needed. (Patient taking differently: Take 325 mg by mouth every 6 (six) hours as needed for moderate pain.), Disp:  , Rfl:  .  albuterol (PROVENTIL HFA;VENTOLIN HFA) 108 (90 BASE) MCG/ACT inhaler, Inhale 1 puff into the lungs every 6 (six) hours as needed for wheezing or shortness of breath., Disp: , Rfl:  .  allopurinol (ZYLOPRIM) 300 MG tablet, Take 300 mg by mouth daily., Disp: , Rfl:  .  amLODipine (NORVASC) 10 MG tablet, Take 10 mg by mouth daily., Disp: , Rfl: 2 .  cloNIDine (CATAPRES) 0.1 MG tablet, Take 0.1 mg by mouth 2 (two) times daily., Disp: , Rfl: 3 .  colchicine 0.6 MG tablet, Take 0.6 mg by mouth every other day., Disp: , Rfl:  .  FLOVENT HFA 110 MCG/ACT inhaler, Inhale 1 puff into the lungs 2 (two) times daily as needed (shortness of breath). , Disp: , Rfl: 3 .  gabapentin (NEURONTIN) 600 MG tablet, Take 600 mg by mouth 3  (three) times daily., Disp: , Rfl:  .  ketoconazole (NIZORAL) 2 % cream, Apply 1 application topically daily., Disp: 15 g, Rfl: 0 .  naproxen (NAPROSYN) 500 MG tablet, Take 500 mg by mouth daily., Disp: , Rfl:  .  nitroGLYCERIN (NITROSTAT) 0.4 MG SL tablet, Place 1 tablet (0.4 mg total) under the tongue every 5 (five) minutes x 3 doses as needed for chest pain., Disp: 30 tablet, Rfl: 3 .  oxyCODONE (OXY IR/ROXICODONE) 5 MG immediate release tablet, Take 1 tablet (5 mg total) by mouth every 6 (six) hours as needed for moderate pain or severe pain., Disp: 30 tablet, Rfl: 0 .  rosuvastatin (CRESTOR) 40 MG tablet, Take 40 mg by mouth daily., Disp: , Rfl:  .  SYMBICORT 160-4.5 MCG/ACT inhaler, Inhale 2 puffs into the lungs 2 (two) times daily., Disp: , Rfl:  .  tiZANidine (ZANAFLEX) 4 MG capsule, Take 4 mg by mouth daily as needed for muscle spasms., Disp: , Rfl: 1 .  traZODone (DESYREL) 50 MG tablet, Take 1 tablet (50 mg total) by mouth at bedtime as needed for sleep., Disp: 30 tablet, Rfl: 0 .  hydrochlorothiazide (HYDRODIURIL) 25 MG tablet, Take 25 mg by mouth daily., Disp: , Rfl:  .  losartan (COZAAR) 100 MG tablet, Take 100 mg by mouth daily., Disp: , Rfl:  .  methocarbamol (ROBAXIN) 500 MG tablet, Take 1 tablet (500 mg total) by mouth 2 (two) times daily., Disp: 20 tablet, Rfl: 0  Orders Placed This Encounter  Procedures  . PCV MYOCARDIAL PERFUSION  WITH LEXISCAN  . EKG 12-Lead  . PCV ECHOCARDIOGRAM COMPLETE    Patient Instructions  Please remind the patient to bring the caridac medication bottles at the next office visit to help facilitate a more accurate medication reconciliation.   --Continue cardiac medications as reconciled in final medication list. --Return in about 4 weeks (around 03/21/2021) for Reevaluation of, Chest pain, Review test results. Or sooner if needed. --Continue follow-up with your primary care physician regarding the management of your other chronic comorbid  conditions.  Patient's questions and concerns were addressed to his satisfaction. He voices understanding of the instructions provided during this encounter.   This note was created using a voice recognition software as a result there may be grammatical errors inadvertently enclosed that do not reflect the nature of this encounter. Every attempt is made to correct such errors.  Total time spent: 45 minutes discussing history of present illness, reviewing recent hospitalization records, reviewing prior echocardiogram and stress test results, discussing disease management, discussed the cardiovascular testing ordered during today's encounter, and formulating a plan of care.  Tessa Lerner, [place], [facility]  Pager: [phone] Office: [phone]

## 2021-02-27 ENCOUNTER — Ambulatory Visit: Payer: Medicaid Other

## 2021-02-27 ENCOUNTER — Other Ambulatory Visit: Payer: Self-pay

## 2021-02-27 DIAGNOSIS — I209 Angina pectoris, unspecified: Secondary | ICD-10-CM

## 2021-03-02 NOTE — Progress Notes (Signed)
Called and spoke to pt regarding stress test results. Pt voiced understanding.

## 2021-03-07 ENCOUNTER — Other Ambulatory Visit: Payer: Medicaid Other

## 2021-03-14 NOTE — Progress Notes (Signed)
No show

## 2021-03-15 ENCOUNTER — Ambulatory Visit: Payer: Medicaid Other | Admitting: Cardiology

## 2021-03-15 DIAGNOSIS — I10 Essential (primary) hypertension: Secondary | ICD-10-CM

## 2021-03-15 DIAGNOSIS — E1142 Type 2 diabetes mellitus with diabetic polyneuropathy: Secondary | ICD-10-CM

## 2021-03-15 DIAGNOSIS — I209 Angina pectoris, unspecified: Secondary | ICD-10-CM

## 2021-03-20 NOTE — Progress Notes (Signed)
Attempted to call pt, no answer. Left vm requesting call back.

## 2021-03-21 NOTE — Progress Notes (Signed)
Second attempt to reach patient no answer left a vm

## 2021-03-22 ENCOUNTER — Ambulatory Visit: Payer: Medicaid Other | Admitting: Cardiology

## 2021-03-23 ENCOUNTER — Other Ambulatory Visit: Payer: Medicaid Other

## 2021-03-23 NOTE — Progress Notes (Signed)
Called and spoke to pt, pt was not aware that he had an echo today so it has to be moved. He is working on scheduling a follow-up with [place] right now.

## 2021-03-29 NOTE — Progress Notes (Signed)
Patient is scheduled for his echo tomorrow and scheduled to see Dr Rosemary Holms 05/5

## 2021-03-30 ENCOUNTER — Ambulatory Visit: Payer: Medicaid Other

## 2021-03-30 ENCOUNTER — Other Ambulatory Visit: Payer: Self-pay

## 2021-03-30 DIAGNOSIS — I209 Angina pectoris, unspecified: Secondary | ICD-10-CM

## 2021-04-03 NOTE — Progress Notes (Signed)
Called pt to inform him about his echo. Pt understood

## 2021-04-06 ENCOUNTER — Other Ambulatory Visit: Payer: Self-pay

## 2021-04-06 ENCOUNTER — Encounter: Payer: Self-pay | Admitting: Cardiology

## 2021-04-06 ENCOUNTER — Ambulatory Visit: Payer: Medicaid Other | Admitting: Cardiology

## 2021-04-06 VITALS — BP 156/99 | HR 77 | Temp 98.2°F | Resp 16 | Ht 65.0 in | Wt 190.0 lb

## 2021-04-06 DIAGNOSIS — I2510 Atherosclerotic heart disease of native coronary artery without angina pectoris: Secondary | ICD-10-CM

## 2021-04-06 MED ORDER — METOPROLOL SUCCINATE ER 50 MG PO TB24: 50.0000 mg | ORAL_TABLET | Freq: Every day | ORAL | 3 refills | Status: DC

## 2021-04-06 NOTE — Progress Notes (Signed)
Patient is here for follow up visit.  Subjective:   Darryl Hurley, male    DOB: February 01, 1961, 60 y.o.   MRN: [id number]   Chief Complaint  Patient presents with  . Hypertension  . Results    echo  . Follow-up   5 -year-old African-American male with hypertension, type II diabetes mellitus,nicotine dependence  Patient was recently seen by my partner Dr Terri Skains for chest pain. He underwent stress test, details below. Patient returns for follow up today, denies any chest pain since last visit. Blood pressure elevated. He is trying to quit smoking. He denies any recent cocaine use.    Current Outpatient Medications on File Prior to Visit  Medication Sig Dispense Refill  . acetaminophen (TYLENOL) 325 MG tablet Take 2 tablets (650 mg total) by mouth every 6 (six) hours as needed. (Patient taking differently: Take 325 mg by mouth every 6 (six) hours as needed for moderate pain.)    . albuterol (PROVENTIL HFA;VENTOLIN HFA) 108 (90 BASE) MCG/ACT inhaler Inhale 1 puff into the lungs every 6 (six) hours as needed for wheezing or shortness of breath.    . allopurinol (ZYLOPRIM) 300 MG tablet Take 300 mg by mouth daily.    Marland Kitchen amLODipine (NORVASC) 10 MG tablet Take 10 mg by mouth daily.  2  . cloNIDine (CATAPRES) 0.1 MG tablet Take 0.1 mg by mouth 2 (two) times daily.  3  . colchicine 0.6 MG tablet Take 0.6 mg by mouth daily as needed. Take 1 tablet (0.6 mg) in 1 hr as needed for gout    . fluticasone (FLONASE) 50 MCG/ACT nasal spray Place into both nostrils.    Marland Kitchen gabapentin (NEURONTIN) 600 MG tablet Take 600 mg by mouth 3 (three) times daily.    Marland Kitchen HYDROcodone-acetaminophen (NORCO/VICODIN) 5-325 MG tablet Take 1 tablet by mouth every 6 (six) hours as needed for moderate pain.    Marland Kitchen lidocaine (LIDODERM) 5 % Place 1 patch onto the skin daily.    Marland Kitchen losartan (COZAAR) 100 MG tablet Take 100 mg by mouth daily.    . meloxicam (MOBIC) 7.5 MG tablet Take 7.5 mg by mouth daily.    . nitroGLYCERIN  (NITROSTAT) 0.4 MG SL tablet Place 1 tablet (0.4 mg total) under the tongue every 5 (five) minutes x 3 doses as needed for chest pain. 30 tablet 3  . oxyCODONE (OXY IR/ROXICODONE) 5 MG immediate release tablet Take 5 mg by mouth every 6 (six) hours as needed for severe pain.    . OXYGEN Inhale into the lungs. 2 liters    . rosuvastatin (CRESTOR) 40 MG tablet Take 40 mg by mouth daily.    . SYMBICORT 160-4.5 MCG/ACT inhaler Inhale 2 puffs into the lungs 2 (two) times daily.    . [DISCONTINUED] labetalol (NORMODYNE) 200 MG tablet Take 1 tablet (200 mg total) by mouth 2 (two) times daily. (Patient not taking: Reported on 03/25/2019) 60 tablet 0   No current facility-administered medications on file prior to visit.    Cardiovascular studies:  Lexiscan Tetrofosmin stress test 02/27/2021: 1 Day Rest/Stress Protocol. Stress EKG is non-diagnostic, as this is pharmacological stress test using Lexiscan. Medium size, mild intensity perfusion defect suggestive of reversible ischemia in the LCx/RCA distribution. Small size, mild intensity, fixed perfusion defect involving the basal inferior and inferolateral segments consistent with diaphragmatic attenuation. TID ratio: 0.93 Left ventricular size: Dilated (EDV 129 cc). Calculated LVEF 46% with mild hypokinesis of the inferior and inferolateral segments. Intermediate risk study, clinical  correlation requested. No prior studies available for comparison.   EKG 08/07/2019: Sinus tachycardia 102 bpm. Possible old inferior infarct.  Left atrial enlargement.   RLE venous duplex US 09/2018: Final Interpretation: Right: Findings consistent with acute deep vein thrombosis involving the right peroneal vein, and right posterior tibial vein. No cystic structure found in the popliteal fossa. Left: No evidence of common femoral vein obstruction   Echocardiogram 02/11/2018: Left ventricle cavity is normal in size. Mild concentric hypertrophy of the left  ventricle. Mild decrease in global wall motion. Visual EF is 45-50%. Doppler evidence of grade I (impaired) diastolic dysfunction, normal LAP. Calculated EF 45%. Left atrial cavity is mildly dilated. Inadequate tricuspid regurgitation jet to estimate pulmonary artery pressure IVC is dilated with blunted respiratory response. Estimated RA pressure 10-15 mmHg.  Lexiscan myoview stress test 01/31/2018:  1. Pharmacologic stress testing was performed with intravenous administration of .4 mg of Lexiscan over a 10-15 seconds infusion. Patient was hypertensive throughout the study, peak BP 200/120 mmHg.  Stress symptoms included dyspnea, dizziness.  2. Exercise capacity not assessed. Stress EKG is non diagnostic for ischemia as it is a pharmacologic stress.  3. The overall quality of the study is good.  Left ventricular cavity is noted to be normal on the rest and stress studies.  Gated SPECT images reveal normal myocardial thickening and wall motion.  The left ventricular ejection fraction was calculated or visually estimated to be 41%.  SPECT images demonstrate small perfusion abnormality of mild intensity in the mid inferolateral myocardial wall(s) on the stress images with mild reversibility on rest images.  4. Low to intermediate risk study.  Recent labs: 02/11/2021: Glucose 103, BUN/Cr 12/1.28. EGFR >60. Na/K 139/3.8. Rest of the CMP normal H/H 14/43. MCV 89. Platelets 306 TSH N/A Trop HS 11-->15  11/2019: HbA1C 6.9%  03/2019: Chol 193, TG 145, HDL 51, LDL 113  Review of Systems  Constitutional: Negative for decreased appetite, malaise/fatigue, weight gain and weight loss.  HENT: Negative for congestion.   Eyes: Negative for visual disturbance.  Cardiovascular: Negative for chest pain, dyspnea on exertion, leg swelling, palpitations and syncope.  Respiratory: Negative for shortness of breath.   Endocrine: Negative for cold intolerance.  Hematologic/Lymphatic: Does not bruise/bleed easily.   Skin: Negative for itching and rash.  Musculoskeletal: Negative for myalgias.       H/o DVT  Gastrointestinal: Negative for abdominal pain, nausea and vomiting.  Genitourinary: Negative for dysuria.  Neurological: Negative for dizziness and weakness.  Psychiatric/Behavioral: Positive for substance abuse (Alcohol). The patient is not nervous/anxious.   All other systems reviewed and are negative.      Objective:    Vitals:   04/06/21 1127 04/06/21 1128  BP: (!) 162/88 (!) 156/99  Pulse: 95 77  Resp: 16   Temp: 98.2 F (36.8 C)   SpO2: 96%      Physical Exam Vitals and nursing note reviewed.  Constitutional:      General: He is not in acute distress. Neck:     Vascular: No JVD.  Cardiovascular:     Rate and Rhythm: Normal rate and regular rhythm.     Heart sounds: Normal heart sounds. No murmur heard.   Pulmonary:     Effort: Pulmonary effort is normal.     Breath sounds: Normal breath sounds. No wheezing or rales.         Assessment & Recommendations:   39 -year-old African-American male with hypertension, type II diabetes mellitus, tobacco abuse, gout, midly reduced  LVEF and mildly abnormal stress test (01/2018), h/o DVT.  Chest pain, abnormal stress test: No recurrence since one episode few weeks ago. Stress test abnormality noted. Reasonable to optimize medical therapy and cath only if recurrence of symptoms. Added metoprolol succinate 50 mg daily. Cautioned against any concurrent use of cocaine.  Added Aspirin. Discontinue meloxicam. Continue statin.   Hypertension Uncontrolled. Added metoprolol.  Monitor for any severe bradycardia given concurrent use of clonidine.   F/u in 3 months  [place], MD [facility] Pager: [phone] Office: [phone] If no answer Cell [phone]

## 2021-04-07 ENCOUNTER — Ambulatory Visit: Payer: Medicaid Other | Admitting: Cardiology

## 2021-04-07 ENCOUNTER — Telehealth: Payer: Self-pay

## 2021-04-07 ENCOUNTER — Encounter: Payer: Self-pay | Admitting: Cardiology

## 2021-04-07 MED ORDER — ASPIRIN EC 81 MG PO TBEC: 81.0000 mg | DELAYED_RELEASE_TABLET | Freq: Every day | ORAL | 3 refills | Status: DC

## 2021-04-10 NOTE — Telephone Encounter (Signed)
done

## 2021-04-14 ENCOUNTER — Ambulatory Visit: Payer: Medicaid Other | Admitting: Cardiology

## 2021-05-11 ENCOUNTER — Ambulatory Visit: Payer: Medicaid Other | Admitting: Physical Therapy

## 2021-05-15 ENCOUNTER — Other Ambulatory Visit: Payer: Self-pay

## 2021-05-15 ENCOUNTER — Ambulatory Visit: Payer: Medicaid Other | Attending: Neurosurgery | Admitting: Rehabilitative and Restorative Service Providers"

## 2021-05-15 ENCOUNTER — Encounter: Payer: Self-pay | Admitting: Rehabilitative and Restorative Service Providers"

## 2021-05-15 DIAGNOSIS — M6281 Muscle weakness (generalized): Secondary | ICD-10-CM | POA: Diagnosis present

## 2021-05-15 DIAGNOSIS — G8929 Other chronic pain: Secondary | ICD-10-CM | POA: Diagnosis present

## 2021-05-15 DIAGNOSIS — M545 Low back pain, unspecified: Secondary | ICD-10-CM | POA: Diagnosis not present

## 2021-05-15 NOTE — Therapy (Signed)
[facility] [address] [place], [place], [address] Phone: [phone]   Fax:  [phone]  Physical Therapy Evaluation  Patient Details  Name: Darryl Hurley MRN: [id number] Date of Birth: Oct 02, 1961 Referring Provider (PT): Dr. Maisie Fus   Encounter Date: 05/15/2021   PT End of Session - 05/15/21 1127     Visit Number 1    Number of Visits 12    Date for PT Re-Evaluation 06/26/21    Authorization Type [facility]    Authorization - Number of Visits 27   per notes in chart   Progress Note Due on Visit 10    PT Start Time 1025    PT Stop Time 1113    PT Time Calculation (min) 48 min    Activity Tolerance Patient tolerated treatment well    Behavior During Therapy [facility] for tasks assessed/performed             Past Medical History:  Diagnosis Date   Alcohol abuse    Anemia    Angina pectoris    Arthritis    Asthma    Chronic lower back pain    COPD (chronic obstructive pulmonary disease) (HCC)    on home O2 2L   Diabetes mellitus without complication (HCC)    DVT (deep venous thrombosis) (HCC)    bilateral   Exertional shortness of breath    Gout    Hypertension    Seizures (HCC)     Past Surgical History:  Procedure Laterality Date   INCISION AND DRAINAGE ABSCESS Bilateral 11/19/2019   Procedure: INCISION AND DRAINAGE BILATERAL SEPTIC KNEES, ASPIRATION OF LEFT KNEE;  Surgeon: Roby Lofts, MD;  Location: [facility] OR;  Service: Orthopedics;  Laterality: Bilateral;   INGUINAL HERNIA REPAIR Bilateral 09/01/2020   Procedure: BILATERAL LAPAROSCOPIC INGUINAL HERNIA REPAIR WITH MESH;  Surgeon: Abigail Miyamoto, MD;  Location: [facility] OR;  Service: General;  Laterality: Bilateral;   INSERTION OF MESH Bilateral 09/01/2020   Procedure: INSERTION OF MESH;  Surgeon: Abigail Miyamoto, MD;  Location: [facility] OR;  Service: General;  Laterality: Bilateral;   NO PAST SURGERIES      There were no vitals filed for this visit.    Subjective  Assessment - 05/15/21 1035     Subjective I can't stand up long b/c of my back pain; I need to get me another cane. My back has been hurting me for a long time and has been worse since they operated on my legs. My back pain is constant and always on the left. Pt reports he used to walk with the walker until the knee surgeries and now is with a cane.    Pertinent History Bil knee replacecements 2022 (?)-not in epic but pt has scars    Limitations Sitting;Standing;Walking;House hold activities    How long can you sit comfortably? inconsistently can sit long periods of time    How long can you stand comfortably? an hour    How long can you walk comfortably? can sometimes walk in the store with the cane and other times needs the electric scooter    Diagnostic tests 07/18/20 Xray lumbar: Straightening of the normal lumbar lordosis. No listhesis. Unchanged  moderate disc height loss and prominent anterior endplate spurring  from L2-L3 through L4-L5. Mild diffuse facet arthropathy.    Patient Stated Goals to have less pain    Currently in Pain? Yes    Pain Score 6     Pain Location Back  Pain Orientation Left;Lower    Pain Descriptors / Indicators Tightness    Pain Type Chronic pain    Pain Radiating Towards n/a    Pain Onset More than a month ago    Pain Frequency Intermittent    Aggravating Factors  bending over to put on shoes, sitting straight up without back support    Pain Relieving Factors aspirin, pain pills    Effect of Pain on Daily Activities pt has to modify activities due to pain    Multiple Pain Sites No                [facility] PT Assessment - 05/15/21 0001       Assessment   Medical Diagnosis Back pain    Referring Provider (PT) Dr. Maisie Fus    Onset Date/Surgical Date --   unknown but this year he reports after his knee surgeries   Hand Dominance Right    Next MD Visit TBD    Prior Therapy none      Precautions   Precautions None      Restrictions   Weight Bearing  Restrictions No      Balance Screen   Has the patient fallen in the past 6 months No    Has the patient had a decrease in activity level because of a fear of falling?  No    Is the patient reluctant to leave their home because of a fear of falling?  No      Home [employer] information centre manager residence      Prior Function   Level of Independence Independent   may need occasional help with donning socks/shoes b/c of back pain     Cognition   Overall Cognitive Status Within Functional Limits for tasks assessed      Observation/Other Assessments   Focus on Therapeutic Outcomes (FOTO)  n/a      Sensation   Light Touch Appears Intact      Coordination   Gross Motor Movements are Fluid and Coordinated Yes      Posture/Postural Control   Posture Comments R thoracolumbar paraspinals tight; L glute tighter      ROM / Strength   AROM / PROM / Strength AROM;PROM;Strength      AROM   Overall AROM Comments Lumbar AROM limited: flex 25%, R sidebending 50% and L 75%, bil rot 75%. R knee ext at rest -12, L -18. bil knee flexion WNL and =      PROM   Overall PROM Comments bil hip flex/IR/ER WNL and = bil with pt reporting "feeling good" with hip flexion PROM assessment      Strength   Overall Strength Comments R hip 4+/5 throughout; L hip flex 4/5, L knee ext/flex 3+/5; L hip abdct/addct 3+/5      Palpation   Spinal mobility L2/3 hypomobile    Palpation comment L3/4 pivot point with standing lumbar flexion      Special Tests   Other special tests SLR + for bil hamstring tightness      Ambulation/Gait   Gait Comments amb with cane                        Objective measurements completed on examination: See above findings.                    PT Long Term Goals - 05/15/21 1148       PT LONG TERM  GOAL #1   Title Pt will be indep with advanced HEP for LBP management    Baseline issued at eval    Time 6    Period Weeks    Status New     Target Date 06/26/21      PT LONG TERM GOAL #2   Title pt will report improvement in LBP with donning/doffing shoes/socks x 75%    Baseline 0%    Time 6    Period Weeks    Status New    Target Date 06/26/21      PT LONG TERM GOAL #3   Title Pt will report </= 3/10 L LBP walking around the store x 45 min    Baseline up to 7/10    Time 6    Period Weeks    Status New    Target Date 06/26/21      PT LONG TERM GOAL #4   Title Pt will demo improved L LE strength to assist with reduction in L LBP strain with weightbearing mobility    Baseline L hip flex 4/5, L knee ext/flex 3+/5; L hip abdct/addct 3+/5    Time 6    Period Weeks    Target Date 06/26/21                    Plan - 05/15/21 1142     Clinical Impression Statement Pt presents to PT with complaints of LBP L > R. He also reports bil knee replacements with scar but not in epic. He reports no issues with LBP until after knee replacements. Pt demonstrates tightness in lumbar paraspinals and tight LE musculature. He also is hypomobile L2/3 per spinal assessment but demos L3/4 pivot point with lumbar flexion. Pt reports SKTC as "feeling good." Pt would benefit from PT for manual therapy for spinal hypomobility as tolerated, lumbar/LE flexibility, and lumbar/core strengthening to assist with pain management.    Personal Factors and Comorbidities Comorbidity 1;Comorbidity 2;Comorbidity 3+    Comorbidities bil knee replacements (?), gout, angina, inguinal hernia repair 09/01/20, seizures, UNABLE TO READ    Examination-Activity Limitations Bend;Locomotion Level;Stand;Transfers;Sit;Dressing    Examination-Participation Restrictions Community Activity    Stability/Clinical Decision Making Stable/Uncomplicated    Clinical Decision Making Low    Rehab Potential Good    PT Frequency 2x / week    PT Duration 6 weeks    PT Treatment/Interventions ADLs/Self Care Home Management;Ultrasound;Moist Heat;DME Instruction;Therapeutic  exercise;Therapeutic activities;Functional mobility training;Patient/family education;Manual techniques;Passive range of motion;Taping    PT Next Visit Plan review HEP, spinal mobs L2/3, STW L    PT Home Exercise Plan SKTC, trunk rotation, access code [email or web address]    Consulted and Agree with Plan of Care Patient             Patient will benefit from skilled therapeutic intervention in order to improve the following deficits and impairments:  Decreased endurance, Decreased mobility, Difficulty walking, Hypomobility, Increased muscle spasms, Decreased range of motion, Decreased activity tolerance, Decreased strength, Impaired flexibility, Pain  Visit Diagnosis: Chronic bilateral low back pain without sciatica  Muscle weakness (generalized)     Problem List Patient Active Problem List   Diagnosis Date Noted   Polyarthralgia 11/19/2019   Sepsis (HCC) 11/19/2019   Septic arthritis (HCC) 11/19/2019   Oxygen dependent 11/19/2019   Hyponatremia 11/19/2019   Alcohol abuse 11/19/2019   Lactic acidosis 01/06/2016   Lactic acid acidosis 01/06/2016   Alcohol abuse with intoxication (HCC) 01/06/2016   COPD with acute  exacerbation (HCC) 02/02/2014   HCAP (healthcare-associated pneumonia) 02/02/2014   Increased anion gap metabolic acidosis 02/02/2014   Cocaine abuse (HCC) 11/18/2013   Fever 11/17/2013   Anemia 11/17/2013   Hypokalemia 11/17/2013   Polyarthritis 11/17/2013   Dehydration 11/17/2013   Type 2 diabetes mellitus with diabetic polyneuropathy, without long-term current use of insulin (HCC) 05/14/2013   Chronic obstructive pulmonary disease (COPD) (HCC) 05/12/2013   Gout 05/12/2013   Headache 05/12/2013   Asthma 05/04/2013   TOBACCO ABUSE 04/27/2009   Primary hypertension 04/27/2009   Angina pectoris (HCC) 04/27/2009    Luna FuseNikia Moore, PT, DPT 05/15/2021, 11:57 AM  [facility] [address] [place], [place], [address] Phone:  [phone]   Fax:  [phone]  Name: Daine Florasnthony R Larranaga MRN: [id number] Date of Birth: 05/04/1961  Check all possible CPT codes: 97110- Therapeutic Exercise, 97140 - Manual Therapy, 97530 - Therapeutic Activities, 97535 - Self Care, and [phone] - Ultrasound

## 2021-05-15 NOTE — Patient Instructions (Signed)
  Advised pt to discontinue if pain; discussed benefit of elastic shoelaces and long handled shoe horn to assist with LBP management with donning/doffing shoes.

## 2021-05-24 ENCOUNTER — Other Ambulatory Visit: Payer: Self-pay

## 2021-05-24 ENCOUNTER — Ambulatory Visit: Payer: Medicaid Other

## 2021-05-24 DIAGNOSIS — M545 Low back pain, unspecified: Secondary | ICD-10-CM

## 2021-05-24 DIAGNOSIS — M6281 Muscle weakness (generalized): Secondary | ICD-10-CM

## 2021-05-24 NOTE — Patient Instructions (Signed)
  [id number] 

## 2021-05-24 NOTE — Therapy (Signed)
[facility] [address] [place], [place], [address] Phone: [phone]   Fax:  [phone]  Physical Therapy Treatment  Patient Details  Name: Darryl Hurley MRN: [id number] Date of Birth: 25-Jun-1961 Referring Provider (PT): Dr. Maisie Fus   Encounter Date: 05/24/2021   PT End of Session - 05/24/21 1214     Visit Number 2    Number of Visits 12    Date for PT Re-Evaluation 06/26/21    Authorization Type [place] [employer]    Authorization - Number of Visits 27    Progress Note Due on Visit 10    PT Start Time 1050    PT Stop Time 1130    PT Time Calculation (min) 40 min    Activity Tolerance Patient tolerated treatment well    Behavior During Therapy WFL for tasks assessed/performed             Past Medical History:  Diagnosis Date   Alcohol abuse    Anemia    Angina pectoris    Arthritis    Asthma    Chronic lower back pain    COPD (chronic obstructive pulmonary disease) (HCC)    on home O2 2L   Diabetes mellitus without complication (HCC)    DVT (deep venous thrombosis) (HCC)    bilateral   Exertional shortness of breath    Gout    Hypertension    Seizures (HCC)     Past Surgical History:  Procedure Laterality Date   INCISION AND DRAINAGE ABSCESS Bilateral 11/19/2019   Procedure: INCISION AND DRAINAGE BILATERAL SEPTIC KNEES, ASPIRATION OF LEFT KNEE;  Surgeon: Roby Lofts, MD;  Location: [facility] OR;  Service: Orthopedics;  Laterality: Bilateral;   INGUINAL HERNIA REPAIR Bilateral 09/01/2020   Procedure: BILATERAL LAPAROSCOPIC INGUINAL HERNIA REPAIR WITH MESH;  Surgeon: Abigail Miyamoto, MD;  Location: [facility] OR;  Service: General;  Laterality: Bilateral;   INSERTION OF MESH Bilateral 09/01/2020   Procedure: INSERTION OF MESH;  Surgeon: Abigail Miyamoto, MD;  Location: [facility] OR;  Service: General;  Laterality: Bilateral;   NO PAST SURGERIES      There were no vitals filed for this visit.   Subjective Assessment - 05/24/21  1046     Subjective Pt reports feeling "a little better" since starting PT. He states he has not been able to sleep well lately due to his gout flaring up. He states he has had L elbow, BIL shoulder, finger, and toe pain related to his gout. He states he plans to contact his PCM about medication changes to address this issue. The pt adds that he has not done any exercises at home.    Pertinent History Bil knee replacecements 2022 (?)-not in epic but pt has scars    Limitations Sitting;Standing;Walking;House hold activities    How long can you sit comfortably? inconsistently can sit long periods of time    How long can you stand comfortably? an hour    How long can you walk comfortably? can sometimes walk in the store with the cane and other times needs the electric scooter    Diagnostic tests 07/18/20 Xray lumbar: Straightening of the normal lumbar lordosis. No listhesis. Unchanged  moderate disc height loss and prominent anterior endplate spurring  from L2-L3 through L4-L5. Mild diffuse facet arthropathy.    Patient Stated Goals to have less pain    Currently in Pain? Yes    Pain Score 5     Pain Location Back    Pain  Orientation Left;Lower    Pain Descriptors / Indicators Tightness    Pain Type Chronic pain    Pain Onset More than a month ago    Pain Frequency Intermittent    Multiple Pain Sites No                               [facility] Adult PT Treatment/Exercise - 05/24/21 0001       Lumbar Exercises: Standing   Other Standing Lumbar Exercises Pallof press at cable column 2x10 BIL 7#, 3#      Lumbar Exercises: Supine   Large Ball Abdominal Isometric --   3x30sec     Lumbar Exercises: Quadruped   Madcat/Old Horse 10 reps   Discontinued due to pt elbow discomfort     Manual Therapy   Manual Therapy Joint mobilization;Soft tissue mobilization    Soft tissue mobilization MTPR to Lumbar paraspinals/ QL BIL; Grade 3 CPA's to L2-4                    PT  Education - 05/24/21 1132     Education Details Pt educated on importance of HEP adherence and proper form when performing new exercises.    Person(s) Educated Patient    Methods Explanation;Demonstration;Tactile cues;Verbal cues;Handout    Comprehension Verbal cues required;Tactile cues required;Returned demonstration;Verbalized understanding                 PT Long Term Goals - 05/15/21 1148       PT LONG TERM GOAL #1   Title Pt will be indep with advanced HEP for LBP management    Baseline issued at eval    Time 6    Period Weeks    Status New    Target Date 06/26/21      PT LONG TERM GOAL #2   Title pt will report improvement in LBP with donning/doffing shoes/socks x 75%    Baseline 0%    Time 6    Period Weeks    Status New    Target Date 06/26/21      PT LONG TERM GOAL #3   Title Pt will report </= 3/10 L LBP walking around the store x 45 min    Baseline up to 7/10    Time 6    Period Weeks    Status New    Target Date 06/26/21      PT LONG TERM GOAL #4   Title Pt will demo improved L LE strength to assist with reduction in L LBP strain with weightbearing mobility    Baseline L hip flex 4/5, L knee ext/flex 3+/5; L hip abdct/addct 3+/5    Time 6    Period Weeks    Target Date 06/26/21                   Plan - 05/24/21 1304     Clinical Impression Statement Pt responded well to all treatment today, demonstrating ability to perform all exercises with good form and without increase in pain. The pt reports that the selected exercises provided a good challenge without causing pain. He also reported a highly therapeutic response to manual therapy, stating that he has 0/10 pain when leaving the clinic following MTPR to his lumbar paraspinals. He will continue to benefit from skilled PT to address his primary impairments and help him return to his prior level of function without limitation.  Personal Factors and Comorbidities Comorbidity 1;Comorbidity  2;Comorbidity 3+    Comorbidities bil knee replacements (?), gout, angina, inguinal hernia repair 09/01/20, seizures, UNABLE TO READ    Examination-Activity Limitations Bend;Locomotion Level;Stand;Transfers;Sit;Dressing    Examination-Participation Restrictions Community Activity    Stability/Clinical Decision Making Stable/Uncomplicated    Clinical Decision Making Low    Rehab Potential Good    PT Frequency 2x / week    PT Duration 6 weeks    PT Treatment/Interventions ADLs/Self Care Home Management;Ultrasound;Moist Heat;DME Instruction;Therapeutic exercise;Therapeutic activities;Functional mobility training;Patient/family education;Manual techniques;Passive range of motion;Taping    PT Next Visit Plan Continue progressing core/ LE strengthening with added manual therapy at end of sessions.    PT Home Exercise Plan access code [phone]    Consulted and Agree with Plan of Care Patient             Patient will benefit from skilled therapeutic intervention in order to improve the following deficits and impairments:  Decreased endurance, Decreased mobility, Difficulty walking, Hypomobility, Increased muscle spasms, Decreased range of motion, Decreased activity tolerance, Decreased strength, Impaired flexibility, Pain  Visit Diagnosis: Chronic bilateral low back pain without sciatica  Muscle weakness (generalized)     Problem List Patient Active Problem List   Diagnosis Date Noted   Polyarthralgia 11/19/2019   Sepsis (HCC) 11/19/2019   Septic arthritis (HCC) 11/19/2019   Oxygen dependent 11/19/2019   Hyponatremia 11/19/2019   Alcohol abuse 11/19/2019   Lactic acidosis 01/06/2016   Lactic acid acidosis 01/06/2016   Alcohol abuse with intoxication (HCC) 01/06/2016   COPD with acute exacerbation (HCC) 02/02/2014   HCAP (healthcare-associated pneumonia) 02/02/2014   Increased anion gap metabolic acidosis 02/02/2014   Cocaine abuse (HCC) 11/18/2013   Fever 11/17/2013   Anemia  11/17/2013   Hypokalemia 11/17/2013   Polyarthritis 11/17/2013   Dehydration 11/17/2013   Type 2 diabetes mellitus with diabetic polyneuropathy, without long-term current use of insulin (HCC) 05/14/2013   Chronic obstructive pulmonary disease (COPD) (HCC) 05/12/2013   Gout 05/12/2013   Headache 05/12/2013   Asthma 05/04/2013   TOBACCO ABUSE 04/27/2009   Primary hypertension 04/27/2009   Angina pectoris (HCC) 04/27/2009    Carmelina Dane, PT, DPT 05/24/21 1:07 PM   [facility] [address] [place], [place], [address] Phone: [phone]   Fax:  [phone]  Name: LONDEN BOK MRN: [id number] Date of Birth: 09/21/1961

## 2021-05-26 ENCOUNTER — Ambulatory Visit: Payer: Medicaid Other

## 2021-05-26 ENCOUNTER — Other Ambulatory Visit: Payer: Self-pay

## 2021-05-26 DIAGNOSIS — G8929 Other chronic pain: Secondary | ICD-10-CM

## 2021-05-26 DIAGNOSIS — M545 Low back pain, unspecified: Secondary | ICD-10-CM | POA: Diagnosis not present

## 2021-05-26 DIAGNOSIS — M6281 Muscle weakness (generalized): Secondary | ICD-10-CM

## 2021-05-26 NOTE — Patient Instructions (Signed)
  [id number]

## 2021-05-26 NOTE — Therapy (Signed)
[facility] [address] [place], [place], [address] Phone: [phone]   Fax:  [phone]  Physical Therapy Treatment  Patient Details  Name: Darryl Hurley MRN: [id number] Date of Birth: 11/24/1961 Referring Provider (PT): Dr. Maisie Fus   Encounter Date: 05/26/2021   PT End of Session - 05/26/21 1201     Visit Number 3    Number of Visits 12    Date for PT Re-Evaluation 06/26/21    Authorization Type [place] [employer]    Authorization - Number of Visits 27    Progress Note Due on Visit 10    PT Start Time 1120    PT Stop Time 1200    PT Time Calculation (min) 40 min    Activity Tolerance Patient tolerated treatment well    Behavior During Therapy WFL for tasks assessed/performed             Past Medical History:  Diagnosis Date   Alcohol abuse    Anemia    Angina pectoris    Arthritis    Asthma    Chronic lower back pain    COPD (chronic obstructive pulmonary disease) (HCC)    on home O2 2L   Diabetes mellitus without complication (HCC)    DVT (deep venous thrombosis) (HCC)    bilateral   Exertional shortness of breath    Gout    Hypertension    Seizures (HCC)     Past Surgical History:  Procedure Laterality Date   INCISION AND DRAINAGE ABSCESS Bilateral 11/19/2019   Procedure: INCISION AND DRAINAGE BILATERAL SEPTIC KNEES, ASPIRATION OF LEFT KNEE;  Surgeon: Roby Lofts, MD;  Location: [facility] OR;  Service: Orthopedics;  Laterality: Bilateral;   INGUINAL HERNIA REPAIR Bilateral 09/01/2020   Procedure: BILATERAL LAPAROSCOPIC INGUINAL HERNIA REPAIR WITH MESH;  Surgeon: Abigail Miyamoto, MD;  Location: [facility] OR;  Service: General;  Laterality: Bilateral;   INSERTION OF MESH Bilateral 09/01/2020   Procedure: INSERTION OF MESH;  Surgeon: Abigail Miyamoto, MD;  Location: [facility] OR;  Service: General;  Laterality: Bilateral;   NO PAST SURGERIES      There were no vitals filed for this visit.   Subjective Assessment - 05/26/21  1117     Subjective Pt reports feeling "a little better" since starting PT. He states he has been sleeping better since the last visit. He has yet to discuss his gout pain with his PCM, but he plans to do this soon. The pt adds that he has been doing his home exercises more regularly since his last visit.    Pertinent History Bil knee replacecements 2022 (?)-not in epic but pt has scars    Limitations Sitting;Standing;Walking;House hold activities    Currently in Pain? Yes    Pain Score 5     Pain Location Back    Pain Orientation Left;Lower    Pain Descriptors / Indicators Tightness    Pain Type Chronic pain    Pain Onset More than a month ago    Pain Frequency Intermittent                               [facility] Adult PT Treatment/Exercise - 05/26/21 0001       Lumbar Exercises: Standing   Other Standing Lumbar Exercises Hip extension with GTB 2x10 BIL      Lumbar Exercises: Supine   Dead Bug --   3x30sec with green physioball  Lumbar Exercises: Sidelying   Hip Abduction --   2x10 BIL     Lumbar Exercises: Quadruped   Other Quadruped Lumbar Exercises Donkey kicks 1x10   Discontinued due to pain in pt's knees when performing     Manual Therapy   Manual Therapy Joint mobilization;Soft tissue mobilization    Joint Mobilization Grade 3 CPA's to L2-L5    Soft tissue mobilization MTPR to Lumbar paraspinals/ QL BIL; Grade 3 CPA's to L2-4                    PT Education - 05/26/21 1201     Education Details Pt educated on importance of HEP adherence and proper form when performing new exercises.    Person(s) Educated Patient    Methods Explanation;Demonstration;Tactile cues;Verbal cues;Handout    Comprehension Verbal cues required;Tactile cues required;Returned demonstration;Verbalized understanding                 PT Long Term Goals - 05/15/21 1148       PT LONG TERM GOAL #1   Title Pt will be indep with advanced HEP for LBP management     Baseline issued at eval    Time 6    Period Weeks    Status New    Target Date 06/26/21      PT LONG TERM GOAL #2   Title pt will report improvement in LBP with donning/doffing shoes/socks x 75%    Baseline 0%    Time 6    Period Weeks    Status New    Target Date 06/26/21      PT LONG TERM GOAL #3   Title Pt will report </= 3/10 L LBP walking around the store x 45 min    Baseline up to 7/10    Time 6    Period Weeks    Status New    Target Date 06/26/21      PT LONG TERM GOAL #4   Title Pt will demo improved L LE strength to assist with reduction in L LBP strain with weightbearing mobility    Baseline L hip flex 4/5, L knee ext/flex 3+/5; L hip abdct/addct 3+/5    Time 6    Period Weeks    Target Date 06/26/21                   Plan - 05/26/21 1202     Clinical Impression Statement Pt responded well to all treatment today, demonstrating ability to perform all exercises with good form and without increase in pain. The pt reports that the selected exercises provided a good challenge without causing pain. Donkey kicks increased his knee pain; therfore this exercise was discontinued in favor of standing hip extension with a band to target his weak hip musculature. He reported a highly therapeutic response to manual therapy, stating that he has 0/10 pain when leaving the clinic following MTPR to his lumbar paraspinals. He will continue to benefit from skilled PT to address his primary impairments and help him return to his prior level of function without limitation.    Personal Factors and Comorbidities Comorbidity 1;Comorbidity 2;Comorbidity 3+    Comorbidities bil knee replacements (?), gout, angina, inguinal hernia repair 09/01/20, seizures, UNABLE TO READ    Examination-Activity Limitations Bend;Locomotion Level;Stand;Transfers;Sit;Dressing    Examination-Participation Restrictions Community Activity    Stability/Clinical Decision Making Stable/Uncomplicated     Clinical Decision Making Low    Rehab Potential Good    PT  Frequency 2x / week    PT Duration 6 weeks    PT Treatment/Interventions ADLs/Self Care Home Management;Ultrasound;Moist Heat;DME Instruction;Therapeutic exercise;Therapeutic activities;Functional mobility training;Patient/family education;Manual techniques;Passive range of motion;Taping    PT Next Visit Plan Continue progressing core/ LE strengthening with added manual therapy at end of sessions.    PT Home Exercise Plan access code [phone]    Consulted and Agree with Plan of Care Patient             Patient will benefit from skilled therapeutic intervention in order to improve the following deficits and impairments:  Decreased endurance, Decreased mobility, Difficulty walking, Hypomobility, Increased muscle spasms, Decreased range of motion, Decreased activity tolerance, Decreased strength, Impaired flexibility, Pain  Visit Diagnosis: Chronic bilateral low back pain without sciatica  Muscle weakness (generalized)     Problem List Patient Active Problem List   Diagnosis Date Noted   Polyarthralgia 11/19/2019   Sepsis (HCC) 11/19/2019   Septic arthritis (HCC) 11/19/2019   Oxygen dependent 11/19/2019   Hyponatremia 11/19/2019   Alcohol abuse 11/19/2019   Lactic acidosis 01/06/2016   Lactic acid acidosis 01/06/2016   Alcohol abuse with intoxication (HCC) 01/06/2016   COPD with acute exacerbation (HCC) 02/02/2014   HCAP (healthcare-associated pneumonia) 02/02/2014   Increased anion gap metabolic acidosis 02/02/2014   Cocaine abuse (HCC) 11/18/2013   Fever 11/17/2013   Anemia 11/17/2013   Hypokalemia 11/17/2013   Polyarthritis 11/17/2013   Dehydration 11/17/2013   Type 2 diabetes mellitus with diabetic polyneuropathy, without long-term current use of insulin (HCC) 05/14/2013   Chronic obstructive pulmonary disease (COPD) (HCC) 05/12/2013   Gout 05/12/2013   Headache 05/12/2013   Asthma 05/04/2013   TOBACCO  ABUSE 04/27/2009   Primary hypertension 04/27/2009   Angina pectoris (HCC) 04/27/2009    Carmelina Dane, PT, DPT 05/26/21 12:05 PM   [facility] [address] [place], [place], [address] Phone: [phone]   Fax:  [phone]  Name: Darryl Hurley MRN: [id number] Date of Birth: 1961/07/28

## 2021-05-29 ENCOUNTER — Ambulatory Visit: Payer: Medicaid Other

## 2021-05-29 ENCOUNTER — Telehealth: Payer: Self-pay

## 2021-05-29 NOTE — Telephone Encounter (Signed)
Spoke with pt regarding his first no-show appointment. Discussed the clinic's attendance policy with the pt, including actions taken after 2nd and 3rd no-show's. Pt reported understanding and confirmed his next appointment on Wednesday.

## 2021-05-31 ENCOUNTER — Other Ambulatory Visit: Payer: Self-pay

## 2021-05-31 ENCOUNTER — Ambulatory Visit: Payer: Medicaid Other

## 2021-05-31 DIAGNOSIS — M545 Low back pain, unspecified: Secondary | ICD-10-CM

## 2021-05-31 DIAGNOSIS — M6281 Muscle weakness (generalized): Secondary | ICD-10-CM

## 2021-05-31 NOTE — Patient Instructions (Signed)
  [id number] 

## 2021-05-31 NOTE — Therapy (Signed)
[facility] Outpatient Rehabilitation [facility] [address] [place], [place], [address] Phone: [phone]   Fax:  [phone]  Physical Therapy Treatment  Patient Details  Name: RUDI BUNYARD MRN: [id number] Date of Birth: 10/12/1961 Referring Provider (PT): Dr. Maisie Fus   Encounter Date: 05/31/2021   PT End of Session - 05/31/21 1123     Visit Number 4    Number of Visits 12    Date for PT Re-Evaluation 06/26/21    Authorization Type [place] MCD    Authorization - Number of Visits 27    Progress Note Due on Visit 10    PT Start Time 1045    PT Stop Time 1125    PT Time Calculation (min) 40 min    Activity Tolerance Patient tolerated treatment well    Behavior During Therapy WFL for tasks assessed/performed             Past Medical History:  Diagnosis Date   Alcohol abuse    Anemia    Angina pectoris    Arthritis    Asthma    Chronic lower back pain    COPD (chronic obstructive pulmonary disease) (HCC)    on home O2 2L   Diabetes mellitus without complication (HCC)    DVT (deep venous thrombosis) (HCC)    bilateral   Exertional shortness of breath    Gout    Hypertension    Seizures (HCC)     Past Surgical History:  Procedure Laterality Date   INCISION AND DRAINAGE ABSCESS Bilateral 11/19/2019   Procedure: INCISION AND DRAINAGE BILATERAL SEPTIC KNEES, ASPIRATION OF LEFT KNEE;  Surgeon: Roby Lofts, MD;  [facility] OR;  Service: Orthopedics;  Laterality: Bilateral;   INGUINAL HERNIA REPAIR Bilateral 09/01/2020   Procedure: BILATERAL LAPAROSCOPIC INGUINAL HERNIA REPAIR WITH MESH;  Surgeon: Abigail Miyamoto, MD;  Location: [facility] OR;  Service: General;  Laterality: Bilateral;   INSERTION OF MESH Bilateral 09/01/2020   Procedure: INSERTION OF MESH;  Surgeon: Abigail Miyamoto, MD;  [facility] OR;  Service: General;  Laterality: Bilateral;   NO PAST SURGERIES      There were no vitals filed for this visit.   Subjective Assessment - 05/31/21  1045     Subjective Pt reports he is feeling "pretty good" today. Pt reports he has been adherent to his HEP with no increased pain when performing his exercises. Pt presents with a new cane today and states he has also addressed his gout pain with a visit to his doctor, who prescribed the pt additional gout medication. He states his pain has been improved since taking this medication.    Currently in Pain? No/denies    Pain Score 0-No pain                [facility] PT Assessment - 05/31/21 0001       Special Tests    Special Tests Hip Special Tests    Hip Special Tests  [facility] Test    Findings Positive    Side Right;Left    Comments --   Severely limited BIL                          [facility] Adult PT Treatment/Exercise - 05/31/21 0001       Lumbar Exercises: Prone   Other Prone Lumbar Exercises Prone hip flexor stretch on side of table 3x30sec BIL      Knee/Hip  Exercises: Standing   Other Standing Knee Exercises mini-squat side steps in // bars with GTB around ankles 2x2 laps      Knee/Hip Exercises: Supine   Other Supine Knee/Hip Exercises Supine bicycle kick with YTB around forefeet 3x10      Manual Therapy   Manual Therapy Joint mobilization;Soft tissue mobilization    Joint Mobilization Grade 3 CPA's to L2-L5    Soft tissue mobilization MTPR to Lumbar paraspinals/ QL BIL; Grade 3 CPA's to L2-4                    PT Education - 05/31/21 1123     Education Details Pt educated on importance of HEP adherence and proper form when performing new exercises.    Person(s) Educated Patient    Methods Explanation;Demonstration;Tactile cues;Verbal cues;Handout    Comprehension Tactile cues required;Verbal cues required;Returned demonstration;Verbalized understanding                 PT Long Term Goals - 05/15/21 1148       PT LONG TERM GOAL #1   Title Pt will be indep with advanced HEP for LBP management    Baseline issued  at eval    Time 6    Period Weeks    Status New    Target Date 06/26/21      PT LONG TERM GOAL #2   Title pt will report improvement in LBP with donning/doffing shoes/socks x 75%    Baseline 0%    Time 6    Period Weeks    Status New    Target Date 06/26/21      PT LONG TERM GOAL #3   Title Pt will report </= 3/10 L LBP walking around the store x 45 min    Baseline up to 7/10    Time 6    Period Weeks    Status New    Target Date 06/26/21      PT LONG TERM GOAL #4   Title Pt will demo improved L LE strength to assist with reduction in L LBP strain with weightbearing mobility    Baseline L hip flex 4/5, L knee ext/flex 3+/5; L hip abdct/addct 3+/5    Time 6    Period Weeks    Target Date 06/26/21                   Plan - 05/31/21 1123     Clinical Impression Statement Pt responded well to all treatment today, demonstrating ability to perform all exercises with good form and without increase in pain. The pt reports that the selected exercises provided a good challenge without causing pain.  He reports a challenge when performing prone hip flexor stretches due to shoulder pain; the pt was advised to do this exercise on his elbows instead of his hands to decrease pain. He requests an assessment of his R shoulder to be done in PT in the future as his pain is limiting. He will continue to benefit from skilled PT to address his primary impairments and help him return to his prior level of function without limitation.    Personal Factors and Comorbidities Comorbidity 1;Comorbidity 2;Comorbidity 3+    Comorbidities bil knee replacements (?), gout, angina, inguinal hernia repair 09/01/20, seizures, UNABLE TO READ    Examination-Activity Limitations Bend;Locomotion Level;Stand;Transfers;Sit;Dressing    Examination-Participation Restrictions Community Activity    Stability/Clinical Decision Making Stable/Uncomplicated    Clinical Decision Making Low    Rehab  Potential Good    PT  Frequency 2x / week    PT Duration 6 weeks    PT Treatment/Interventions ADLs/Self Care Home Management;Ultrasound;Moist Heat;DME Instruction;Therapeutic exercise;Therapeutic activities;Functional mobility training;Patient/family education;Manual techniques;Passive range of motion;Taping    PT Next Visit Plan Continue progressing core/ LE strengthening with added manual therapy at end of sessions. Assess R shoulder.    PT Home Exercise Plan access code [id number]    Consulted and Agree with Plan of Care Patient             Patient will benefit from skilled therapeutic intervention in order to improve the following deficits and impairments:  Decreased endurance, Decreased mobility, Difficulty walking, Hypomobility, Increased muscle spasms, Decreased range of motion, Decreased activity tolerance, Decreased strength, Impaired flexibility, Pain  Visit Diagnosis: Muscle weakness (generalized)  Chronic bilateral low back pain without sciatica     Problem List Patient Active Problem List   Diagnosis Date Noted   Polyarthralgia 11/19/2019   Sepsis (HCC) 11/19/2019   Septic arthritis (HCC) 11/19/2019   Oxygen dependent 11/19/2019   Hyponatremia 11/19/2019   Alcohol abuse 11/19/2019   Lactic acidosis 01/06/2016   Lactic acid acidosis 01/06/2016   Alcohol abuse with intoxication (HCC) 01/06/2016   COPD with acute exacerbation (HCC) 02/02/2014   HCAP (healthcare-associated pneumonia) 02/02/2014   Increased anion gap metabolic acidosis 02/02/2014   Cocaine abuse (HCC) 11/18/2013   Fever 11/17/2013   Anemia 11/17/2013   Hypokalemia 11/17/2013   Polyarthritis 11/17/2013   Dehydration 11/17/2013   Type 2 diabetes mellitus with diabetic polyneuropathy, without long-term current use of insulin (HCC) 05/14/2013   Chronic obstructive pulmonary disease (COPD) (HCC) 05/12/2013   Gout 05/12/2013   Headache 05/12/2013   Asthma 05/04/2013   TOBACCO ABUSE 04/27/2009   Primary hypertension  04/27/2009   Angina pectoris (HCC) 04/27/2009    Carmelina Dane, PT, DPT 05/31/21 11:29 AM   [facility] Out[facility] [address] [place], [place], [address] Phone: [phone]   Fax:  [phone]  Name: LEILAND MIHELICH MRN: [id number] Date of Birth: August 29, 1961

## 2021-06-07 ENCOUNTER — Ambulatory Visit: Payer: Medicaid Other

## 2021-06-09 ENCOUNTER — Other Ambulatory Visit: Payer: Self-pay

## 2021-06-09 ENCOUNTER — Ambulatory Visit: Payer: Medicaid Other | Attending: Neurosurgery

## 2021-06-09 DIAGNOSIS — M545 Low back pain, unspecified: Secondary | ICD-10-CM | POA: Diagnosis present

## 2021-06-09 DIAGNOSIS — G8929 Other chronic pain: Secondary | ICD-10-CM | POA: Insufficient documentation

## 2021-06-09 DIAGNOSIS — M6281 Muscle weakness (generalized): Secondary | ICD-10-CM | POA: Insufficient documentation

## 2021-06-09 NOTE — Therapy (Signed)
[facility] Outpatient Rehabilitation [facility] [address] [place], [place], [address] Phone: [phone]   Fax:  [phone]  Physical Therapy Treatment  Patient Details  Name: Darryl Hurley MRN: [id number] Date of Birth: May 31, 1961 Referring Provider (PT): Dr. Maisie Fus   Encounter Date: 06/09/2021   PT End of Session - 06/09/21 1107     Visit Number 5    Number of Visits 12    Date for PT Re-Evaluation 06/26/21    Authorization Type [place] MCD    Authorization - Number of Visits 27    Progress Note Due on Visit 10    PT Start Time 1045    PT Stop Time 1130    PT Time Calculation (min) 45 min    Activity Tolerance Patient tolerated treatment well    Behavior During Therapy WFL for tasks assessed/performed             Past Medical History:  Diagnosis Date   Alcohol abuse    Anemia    Angina pectoris    Arthritis    Asthma    Chronic lower back pain    COPD (chronic obstructive pulmonary disease) (HCC)    on home O2 2L   Diabetes mellitus without complication (HCC)    DVT (deep venous thrombosis) (HCC)    bilateral   Exertional shortness of breath    Gout    Hypertension    Seizures (HCC)     Past Surgical History:  Procedure Laterality Date   INCISION AND DRAINAGE ABSCESS Bilateral 11/19/2019   Procedure: INCISION AND DRAINAGE BILATERAL SEPTIC KNEES, ASPIRATION OF LEFT KNEE;  Surgeon: Roby Lofts, MD;  [facility] OR;  Service: Orthopedics;  Laterality: Bilateral;   INGUINAL HERNIA REPAIR Bilateral 09/01/2020   Procedure: BILATERAL LAPAROSCOPIC INGUINAL HERNIA REPAIR WITH MESH;  Surgeon: Abigail Miyamoto, MD;  Location: [facility] OR;  Service: General;  Laterality: Bilateral;   INSERTION OF MESH Bilateral 09/01/2020   Procedure: INSERTION OF MESH;  Surgeon: Abigail Miyamoto, MD;  [facility] OR;  Service: General;  Laterality: Bilateral;   NO PAST SURGERIES      There were no vitals filed for this visit.   Subjective Assessment - 06/09/21  1047     Subjective Pt reports feeling great today. He says his back has been feeling better, although he reports eratic adherence to his HEP, performing his exercises occasionally. He says his primary complaint at this time is his R shoulder.    Currently in Pain? No/denies    Pain Score 0-No pain                               [facility] Adult PT Treatment/Exercise - 06/09/21 0001       Lumbar Exercises: Standing   Other Standing Lumbar Exercises Resisted trunk side bend with blue TB 2x10 BIL    Other Standing Lumbar Exercises Chops at cable column with 10# 2x10 BIL      Lumbar Exercises: Supine   Other Supine Lumbar Exercises Bicycles 2x15      Lumbar Exercises: Prone   Other Prone Lumbar Exercises Swimmers 2x10 BIL      Modalities   Modalities Moist Heat      Moist Heat Therapy   Number Minutes Moist Heat 10 Minutes    Moist Heat Location Lumbar Spine      Manual Therapy   Manual Therapy Joint mobilization;Soft tissue mobilization    Joint Mobilization  Grade 3 CPA's to L2-L5    Soft tissue mobilization MTPR to Lumbar paraspinals/ QL BIL; Grade 3 CPA's to L2-4                    PT Education - 06/09/21 1107     Education Details Pt educated on importance of HEP adherence and proper form when performing new exercises.    Person(s) Educated Patient    Methods Explanation;Demonstration;Tactile cues;Verbal cues;Handout    Comprehension Tactile cues required;Verbal cues required;Returned demonstration;Verbalized understanding                 PT Long Term Goals - 05/15/21 1148       PT LONG TERM GOAL #1   Title Pt will be indep with advanced HEP for LBP management    Baseline issued at eval    Time 6    Period Weeks    Status New    Target Date 06/26/21      PT LONG TERM GOAL #2   Title pt will report improvement in LBP with donning/doffing shoes/socks x 75%    Baseline 0%    Time 6    Period Weeks    Status New    Target Date  06/26/21      PT LONG TERM GOAL #3   Title Pt will report </= 3/10 L LBP walking around the store x 45 min    Baseline up to 7/10    Time 6    Period Weeks    Status New    Target Date 06/26/21      PT LONG TERM GOAL #4   Title Pt will demo improved L LE strength to assist with reduction in L LBP strain with weightbearing mobility    Baseline L hip flex 4/5, L knee ext/flex 3+/5; L hip abdct/addct 3+/5    Time 6    Period Weeks    Target Date 06/26/21                   Plan - 06/09/21 1108     Clinical Impression Statement Pt responded well to all treatment today, demonstrating ability to perform all exercises with good form and without increase in pain. The pt reports that the selected exercises provided a good challenge without causing pain.  He responded well to manual therapy and moist heat, reporting improved symptoms following treatment. He again requests an assessment of his R shoulder to be done in PT in the future as his pain is limiting. He was instructed to schedule a visit with his PCM to request an additional referral for R shoulder pain. He will continue to benefit from skilled PT to address his primary impairments and help him return to his prior level of function without limitation.    Personal Factors and Comorbidities Comorbidity 1;Comorbidity 2;Comorbidity 3+    Comorbidities bil knee replacements (?), gout, angina, inguinal hernia repair 09/01/20, seizures, UNABLE TO READ    Examination-Activity Limitations Bend;Locomotion Level;Stand;Transfers;Sit;Dressing    Examination-Participation Restrictions Community Activity    Stability/Clinical Decision Making Stable/Uncomplicated    Clinical Decision Making Low    Rehab Potential Good    PT Frequency 2x / week    PT Duration 6 weeks    PT Treatment/Interventions ADLs/Self Care Home Management;Ultrasound;Moist Heat;DME Instruction;Therapeutic exercise;Therapeutic activities;Functional mobility  training;Patient/family education;Manual techniques;Passive range of motion;Taping    PT Next Visit Plan Continue progressing core/ LE strengthening with added manual therapy at end of sessions.  PT Home Exercise Plan access code [phone]    Consulted and Agree with Plan of Care Patient             Patient will benefit from skilled therapeutic intervention in order to improve the following deficits and impairments:  Decreased endurance, Decreased mobility, Difficulty walking, Hypomobility, Increased muscle spasms, Decreased range of motion, Decreased activity tolerance, Decreased strength, Impaired flexibility, Pain  Visit Diagnosis: Muscle weakness (generalized)  Chronic bilateral low back pain without sciatica     Problem List Patient Active Problem List   Diagnosis Date Noted   Polyarthralgia 11/19/2019   Sepsis (HCC) 11/19/2019   Septic arthritis (HCC) 11/19/2019   Oxygen dependent 11/19/2019   Hyponatremia 11/19/2019   Alcohol abuse 11/19/2019   Lactic acidosis 01/06/2016   Lactic acid acidosis 01/06/2016   Alcohol abuse with intoxication (HCC) 01/06/2016   COPD with acute exacerbation (HCC) 02/02/2014   HCAP (healthcare-associated pneumonia) 02/02/2014   Increased anion gap metabolic acidosis 02/02/2014   Cocaine abuse (HCC) 11/18/2013   Fever 11/17/2013   Anemia 11/17/2013   Hypokalemia 11/17/2013   Polyarthritis 11/17/2013   Dehydration 11/17/2013   Type 2 diabetes mellitus with diabetic polyneuropathy, without long-term current use of insulin (HCC) 05/14/2013   Chronic obstructive pulmonary disease (COPD) (HCC) 05/12/2013   Gout 05/12/2013   Headache 05/12/2013   Asthma 05/04/2013   TOBACCO ABUSE 04/27/2009   Primary hypertension 04/27/2009   Angina pectoris (HCC) 04/27/2009    Carmelina Dane, PT, DPT 06/09/21 11:23 AM   [facility] Outpatient Rehabilitation [facility] [address] [place], [place], [address] Phone: [phone]    Fax:  [phone]  Name: Darryl Hurley MRN: [id number] Date of Birth: March 14, 1961

## 2021-06-09 NOTE — Patient Instructions (Signed)
  [id number] 

## 2021-06-12 ENCOUNTER — Ambulatory Visit: Payer: Medicaid Other

## 2021-06-12 ENCOUNTER — Other Ambulatory Visit: Payer: Self-pay

## 2021-06-12 DIAGNOSIS — M6281 Muscle weakness (generalized): Secondary | ICD-10-CM

## 2021-06-12 DIAGNOSIS — G8929 Other chronic pain: Secondary | ICD-10-CM

## 2021-06-12 NOTE — Therapy (Addendum)
[place], [place], [address] Phone: [phone]   Fax:  [phone]  Physical Therapy Treatment/ Discharge Summary  Patient Details  Name: Darryl Hurley MRN: [id number] Date of Birth: [birth year] Referring Provider (PT): Dr. Marcello Moores   Encounter Date: 06/12/2021   PT End of Session - 06/12/21 1120     Visit Number 6    Number of Visits 12    Date for PT Re-Evaluation 06/26/21    Authorization Type [place] MCD    Authorization - Number of Visits 27    Progress Note Due on Visit 10    PT Start Time 1050    PT Stop Time 1130    PT Time Calculation (min) 40 min    Activity Tolerance Patient tolerated treatment well    Behavior During Therapy WFL for tasks assessed/performed             Past Medical History:  Diagnosis Date   Alcohol abuse    Anemia    Angina pectoris    Arthritis    Asthma    Chronic lower back pain    COPD (chronic obstructive pulmonary disease) ([place])    on home O2 2L   Diabetes mellitus without complication ([place])    DVT (deep venous thrombosis) (HCC)    bilateral   Exertional shortness of breath    Gout    Hypertension    Seizures ([place])     Past Surgical History:  Procedure Laterality Date   INCISION AND DRAINAGE ABSCESS Bilateral 11/19/2019   Procedure: INCISION AND DRAINAGE BILATERAL SEPTIC KNEES, ASPIRATION OF LEFT KNEE;  Surgeon: Shona Needles, MD;  Location: [place];  Service: Orthopedics;  Laterality: Bilateral;   INGUINAL HERNIA REPAIR Bilateral 09/01/2020   Procedure: BILATERAL LAPAROSCOPIC INGUINAL HERNIA REPAIR WITH MESH;  Surgeon: Coralie Keens, MD;  Location: [place];  Service: General;  Laterality: Bilateral;   INSERTION OF MESH Bilateral 09/01/2020   Procedure: INSERTION OF MESH;  Surgeon: Coralie Keens, MD;  Location: [place];  Service: General;  Laterality: Bilateral;   NO PAST SURGERIES      There were no vitals filed for this visit.   Subjective  Assessment - 06/12/21 1058     Subjective Pt reports feeling well today with no pain. He states he has tried to stay adherent to his HEP, although he hasn't done all of the exercises. He reports that he can feel himself getting better since starting PT. He states that his back hasn't ben bothering him much recently and that he was able to sleep most of yesterday without any difficulty.    Currently in Pain? No/denies    Pain Score 0-No pain                               [facility] Adult PT Treatment/Exercise - 06/12/21 0001       Lumbar Exercises: Stretches   Lower Trunk Rotation --   2x10 supine     Lumbar Exercises: Supine   Dead Bug --   3x10 BIL with green physioball   Bridge with Ball Squeeze Limitations 2x10 with 3-second hold      Lumbar Exercises: Sidelying   Hip Abduction --   2x10 BIL with 3-sec holds     Lumbar Exercises: Prone   Other Prone Lumbar Exercises Swimmers 2x10 BIL      Moist Heat Therapy   Number Minutes Moist Heat  10 Minutes    Moist Heat Location Lumbar Spine                    PT Education - 06/12/21 1118     Education Details Pt educated on importance of regular HEP adherence, as well as proper form when performing exercises.    Person(s) Educated Patient    Methods Explanation;Demonstration;Tactile cues;Verbal cues;Handout    Comprehension Tactile cues required;Verbal cues required;Returned demonstration;Verbalized understanding                 PT Long Term Goals - 05/15/21 1148       PT LONG TERM GOAL #1   Title Pt will be indep with advanced HEP for LBP management    Baseline issued at eval    Time 6    Period Weeks    Status New    Target Date 06/26/21      PT LONG TERM GOAL #2   Title pt will report improvement in LBP with donning/doffing shoes/socks x 75%    Baseline 0%    Time 6    Period Weeks    Status New    Target Date 06/26/21      PT LONG TERM GOAL #3   Title Pt will report </= 3/10 L  LBP walking around the store x 45 min    Baseline up to 7/10    Time 6    Period Weeks    Status New    Target Date 06/26/21      PT LONG TERM GOAL #4   Title Pt will demo improved L LE strength to assist with reduction in L LBP strain with weightbearing mobility    Baseline L hip flex 4/5, L knee ext/flex 3+/5; L hip abdct/addct 3+/5    Time 6    Period Weeks    Target Date 06/26/21                   Plan - 06/12/21 1121     Clinical Impression Statement Pt responded well to all treatment today, demonstrating ability to perform all exercises with good form and without increase in pain. The pt reports that the selected exercises provided a good challenge without causing pain. He responded well moist heat, reporting improved symptoms following treatment. Pt deferred manual therapy today due to feeling well enough without it. He will continue to benefit from skilled PT to address his primary impairments and help him return to his prior level of function without limitation.    Personal Factors and Comorbidities Comorbidity 1;Comorbidity 2;Comorbidity 3+    Comorbidities bil knee replacements (?), gout, angina, inguinal hernia repair 09/01/20, seizures, UNABLE TO READ    Examination-Activity Limitations Bend;Locomotion Level;Stand;Transfers;Sit;Dressing    Examination-Participation Restrictions Community Activity    Stability/Clinical Decision Making Stable/Uncomplicated    Clinical Decision Making Low    Rehab Potential Good    PT Frequency 2x / week    PT Duration 6 weeks    PT Treatment/Interventions ADLs/Self Care Home Management;Ultrasound;Moist Heat;DME Instruction;Therapeutic exercise;Therapeutic activities;Functional mobility training;Patient/family education;Manual techniques;Passive range of motion;Taping    PT Next Visit Plan Continue progressing core/ LE strengthening with added manual therapy/ moist heat PRN    PT Home Exercise Plan access code [email or web address]    DHRCBULAG  and Agree with Plan of Care Patient             Patient will benefit from skilled therapeutic intervention in order to improve the following deficits  and impairments:  Decreased endurance, Decreased mobility, Difficulty walking, Hypomobility, Increased muscle spasms, Decreased range of motion, Decreased activity tolerance, Decreased strength, Impaired flexibility, Pain  Visit Diagnosis: Muscle weakness (generalized)  Chronic bilateral low back pain without sciatica     Problem List Patient Active Problem List   Diagnosis Date Noted   Polyarthralgia 11/19/2019   Sepsis ([place]) 11/19/2019   Septic arthritis ([place]) 11/19/2019   Oxygen dependent 11/19/2019   Hyponatremia 11/19/2019   Alcohol abuse 11/19/2019   Lactic acidosis 01/06/2016   Lactic acid acidosis 01/06/2016   Alcohol abuse with intoxication ([place]) 01/06/2016   COPD with acute exacerbation ([place]) 02/02/2014   HCAP (healthcare-associated pneumonia) 02/02/2014   Increased anion gap metabolic acidosis 49/70/2637   Cocaine abuse ([place]) 11/18/2013   Fever 11/17/2013   Anemia 11/17/2013   Hypokalemia 11/17/2013   Polyarthritis 11/17/2013   Dehydration 11/17/2013   Type 2 diabetes mellitus with diabetic polyneuropathy, without long-term current use of insulin ([place]) 05/14/2013   Chronic obstructive pulmonary disease (COPD) ([place]) 05/12/2013   Gout 05/12/2013   Headache 05/12/2013   Asthma 05/04/2013   TOBACCO ABUSE 04/27/2009   Primary hypertension 04/27/2009   Angina pectoris ([place]) 04/27/2009    Vanessa [place], PT, DPT 06/12/21 11:25 AM   [place] [address] [place], [place], [address] Phone: [phone]   Fax:  [phone]  Name: MIKEAL WINSTANLEY MRN: [id number] Date of Birth: [birth year]  PHYSICAL THERAPY DISCHARGE SUMMARY  Visits from Start of Care: 6  Current functional level related to goals / functional outcomes: Unable to assess   Remaining  deficits: Unable to assess   Education / Equipment: HEP   Patient agrees to discharge. Patient goals were not met. Patient is being discharged due to not returning since the last visit.  Vanessa , PT, DPT 08/14/21 1:37 PM

## 2021-06-12 NOTE — Patient Instructions (Signed)
  [id number] 

## 2021-06-14 ENCOUNTER — Ambulatory Visit: Payer: Medicaid Other

## 2021-06-14 ENCOUNTER — Other Ambulatory Visit: Payer: Self-pay | Admitting: Nurse Practitioner

## 2021-06-14 ENCOUNTER — Telehealth: Payer: Self-pay

## 2021-06-14 DIAGNOSIS — R109 Unspecified abdominal pain: Secondary | ICD-10-CM

## 2021-06-14 NOTE — Telephone Encounter (Signed)
Spoke with pt regarding his 2nd no-show appointment. He reports that he forgot to mention to the front desk at his last visit that he would not be able to make it today due to having a doctor's appointment at the same time as his scheduled visit. He was given the clinic's phone number and instructed to call to schedule his next visit. He was also informed that he would need to schedule one visit at a time in accordance with the clinic's attendance policy. The pt was also informed that 3 no-show's are grounds for discharge from PT. He reports understanding of this plan.

## 2021-06-15 ENCOUNTER — Ambulatory Visit
Admission: RE | Admit: 2021-06-15 | Discharge: 2021-06-15 | Disposition: A | Discharge status: Home / Self Care | Payer: Medicaid Other | Source: Ambulatory Visit | Attending: Nurse Practitioner | Admitting: Nurse Practitioner

## 2021-06-15 DIAGNOSIS — R109 Unspecified abdominal pain: Secondary | ICD-10-CM

## 2021-06-15 IMAGING — US US ABDOMEN COMPLETE
1 series · 14 of 25 positions shown · non-contrast
Comparison: None.

CLINICAL DATA: Abdominal pain.

EXAM:
ABDOMEN ULTRASOUND COMPLETE

[Series 1: us abdomen complete · 0.19mm/px · 14 of 86 slices shown]
[im 1/86]
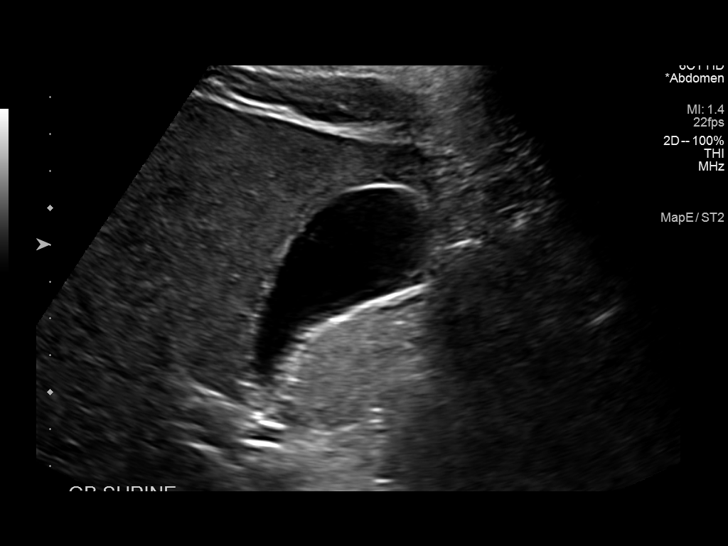
[im 8/86]
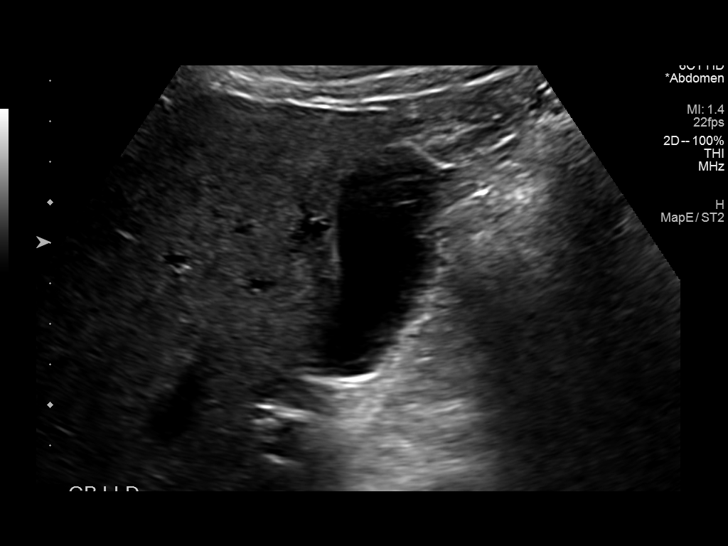
[im 15/86]
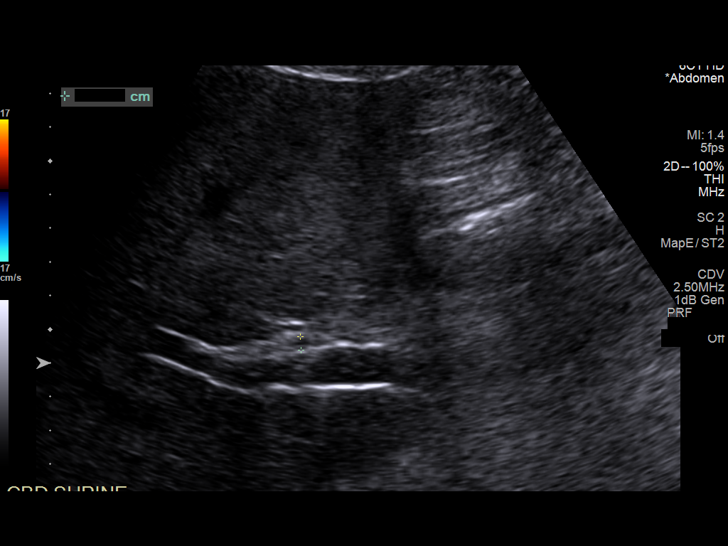
[im 22/86]
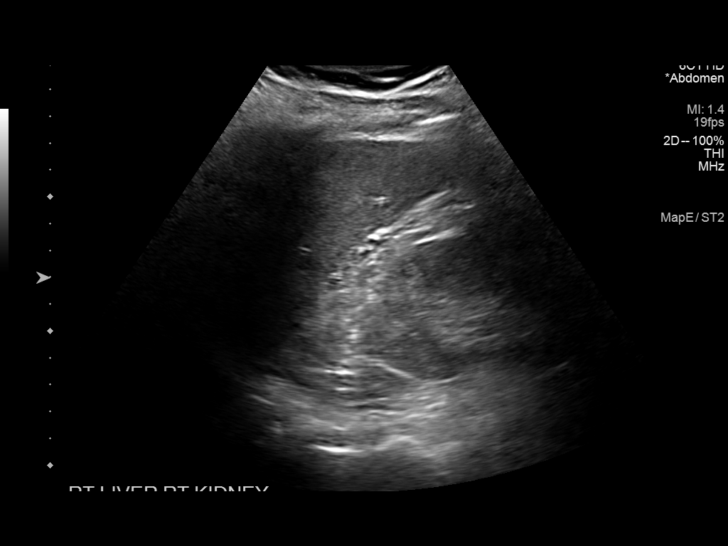
[im 29/86]
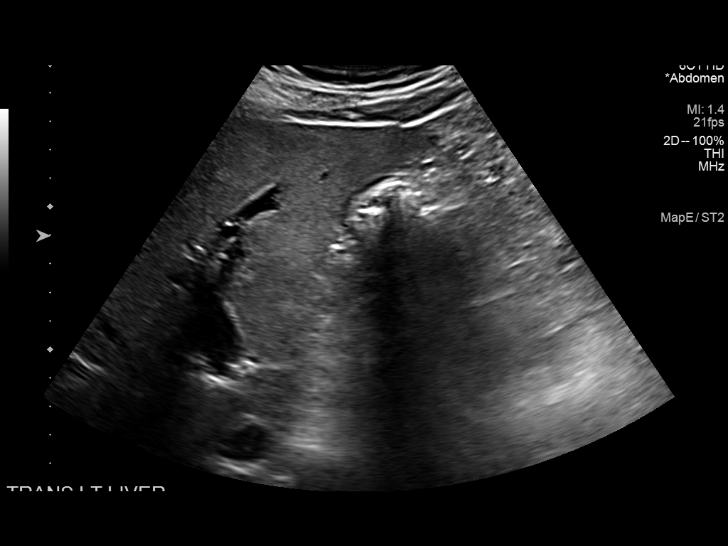
[im 32/86]
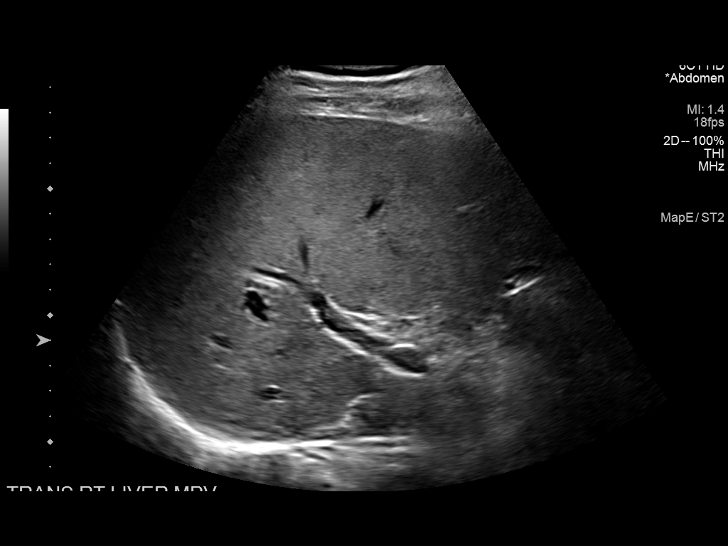
[im 39/86]
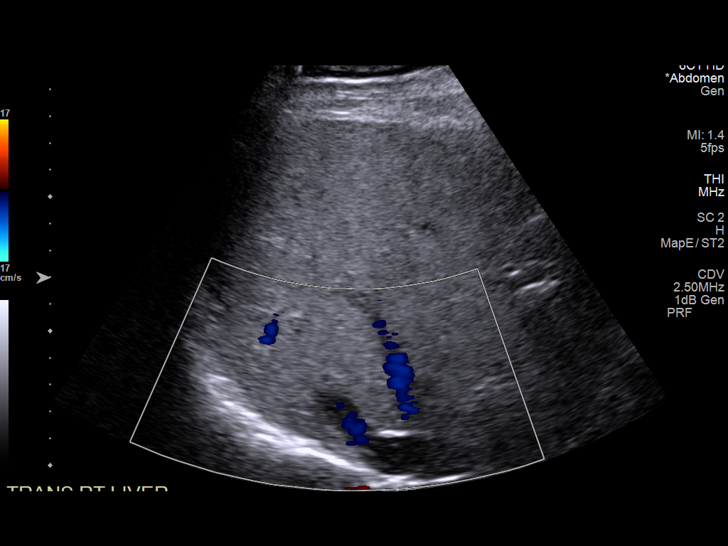
[im 47/86]
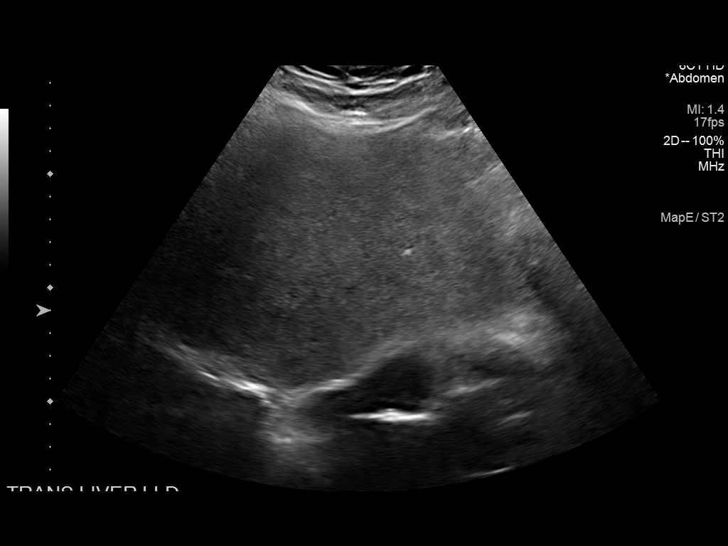
[im 54/86]
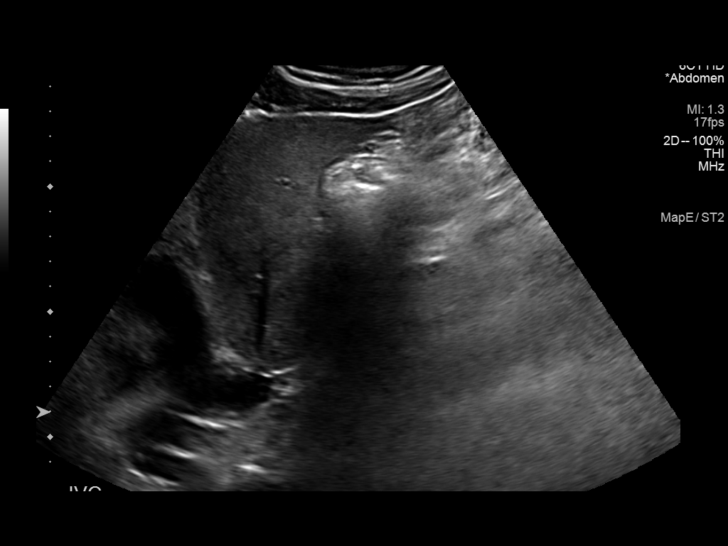
[im 57/86]
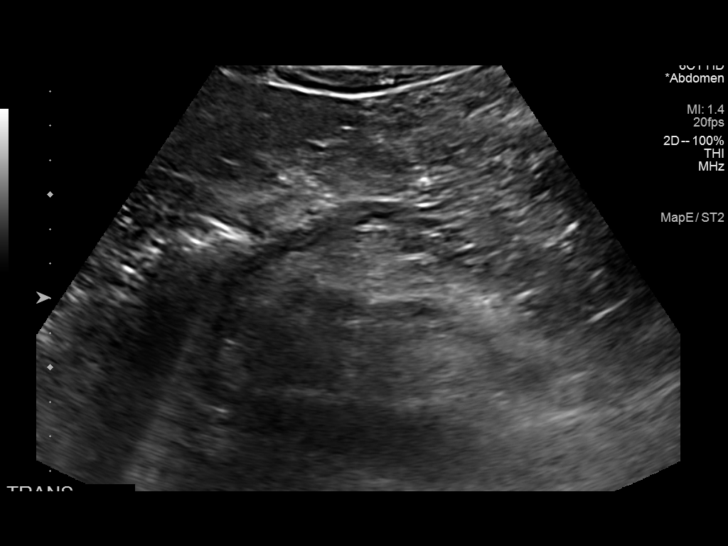
[im 64/86]
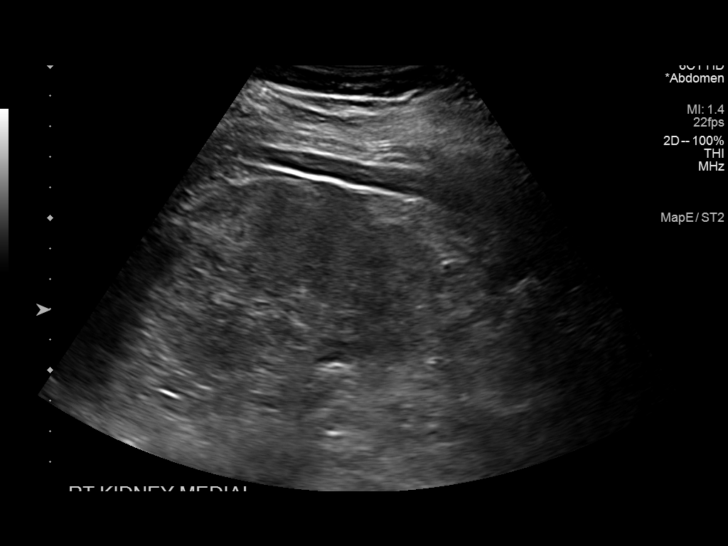
[im 71/86]
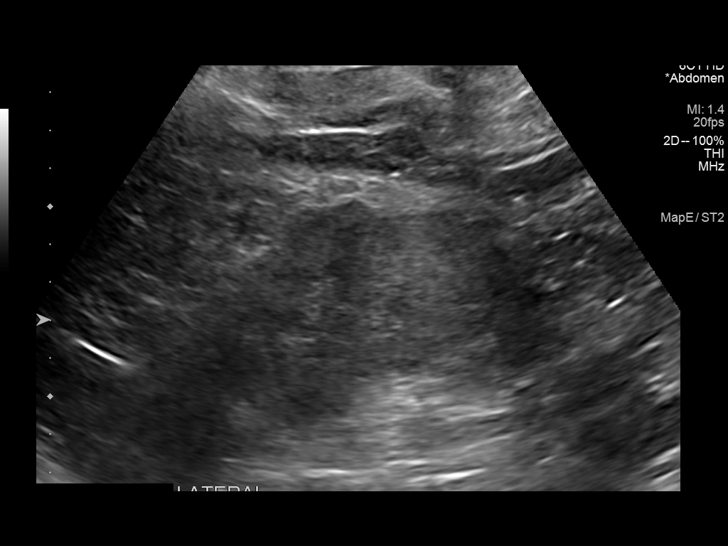
[im 78/86]
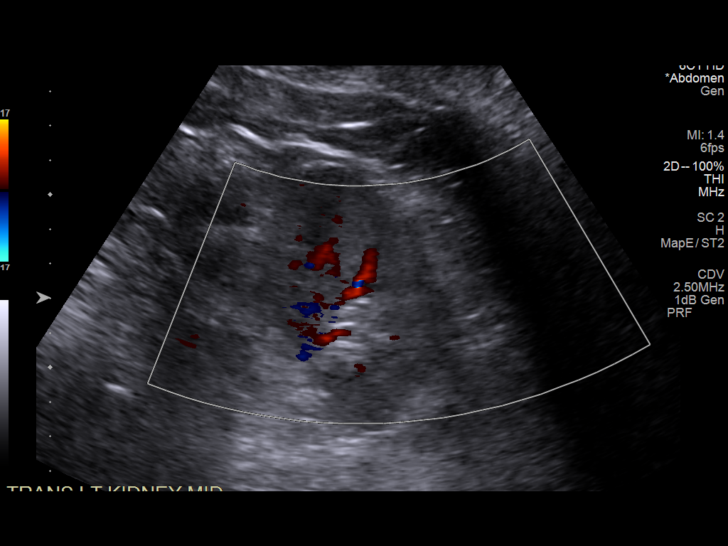
[im 86/86]
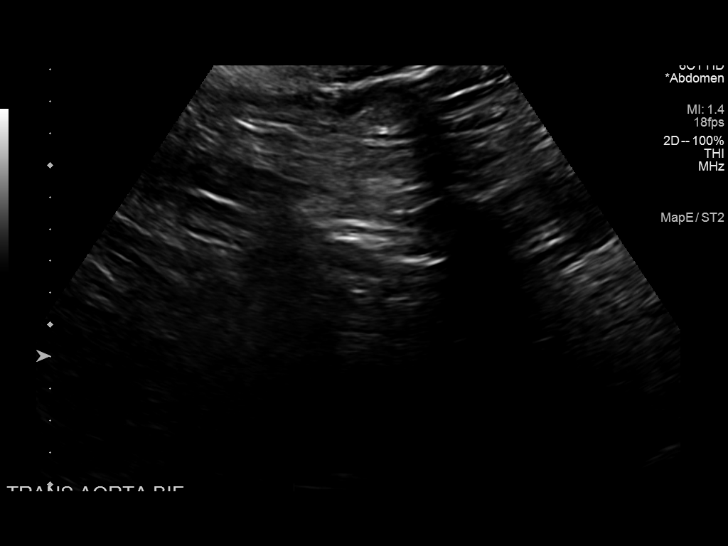

[14 of 25 positions shown; findings below may reference images not displayed]

FINDINGS: Gallbladder: No gallstones or wall thickening visualized. No
sonographic Murphy sign noted by sonographer.

Common bile duct: Diameter: 3 mm, within normal limits.

Liver: No focal lesion identified. Within normal limits in
parenchymal echogenicity. Portal vein is patent on color Doppler
imaging with normal direction of blood flow towards the liver.

IVC: No abnormality visualized.

Pancreas: Visualized portion unremarkable.

Spleen: Not visualized.

Right Kidney: Length: 10.5 cm. Diffusely increased renal parenchymal
echogenicity noted. No mass or hydronephrosis visualized.

Left Kidney: Length: 10.7 cm. Diffusely increased parenchymal
echogenicity noted. No mass or hydronephrosis visualized.

Abdominal aorta: No aneurysm visualized.

Other findings: None.
IMPRESSION: Diffusely increased renal parenchymal echogenicity, consistent with
medical renal disease.

No evidence of hydronephrosis or other significant abnormality.

## 2021-06-29 IMAGING — CR DG SHOULDER 2+V*L*
2 series · 2 of 2 positions shown · non-contrast
Comparison: None.

CLINICAL DATA: Left shoulder pain.

EXAM:
LEFT SHOULDER - 2+ VIEW

[shoulder grashey]
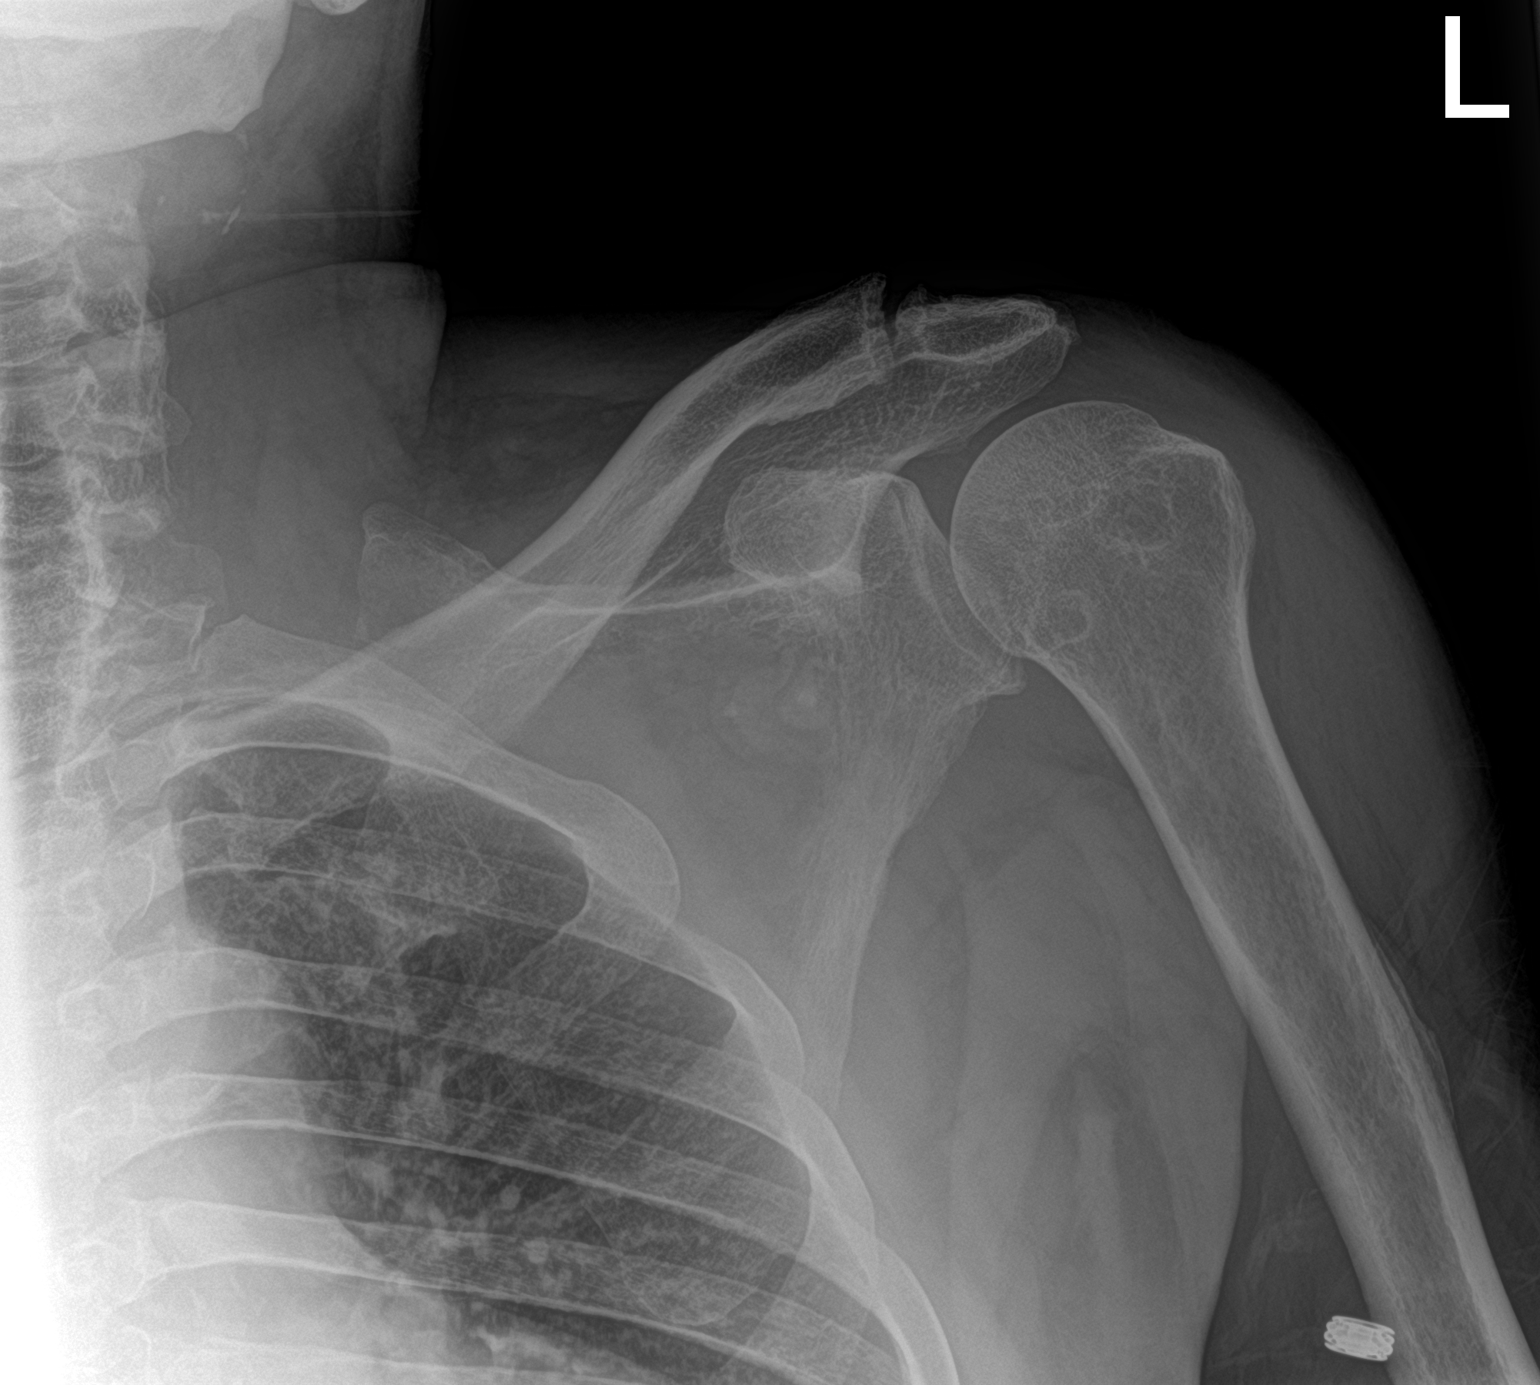

[shoulder y view]
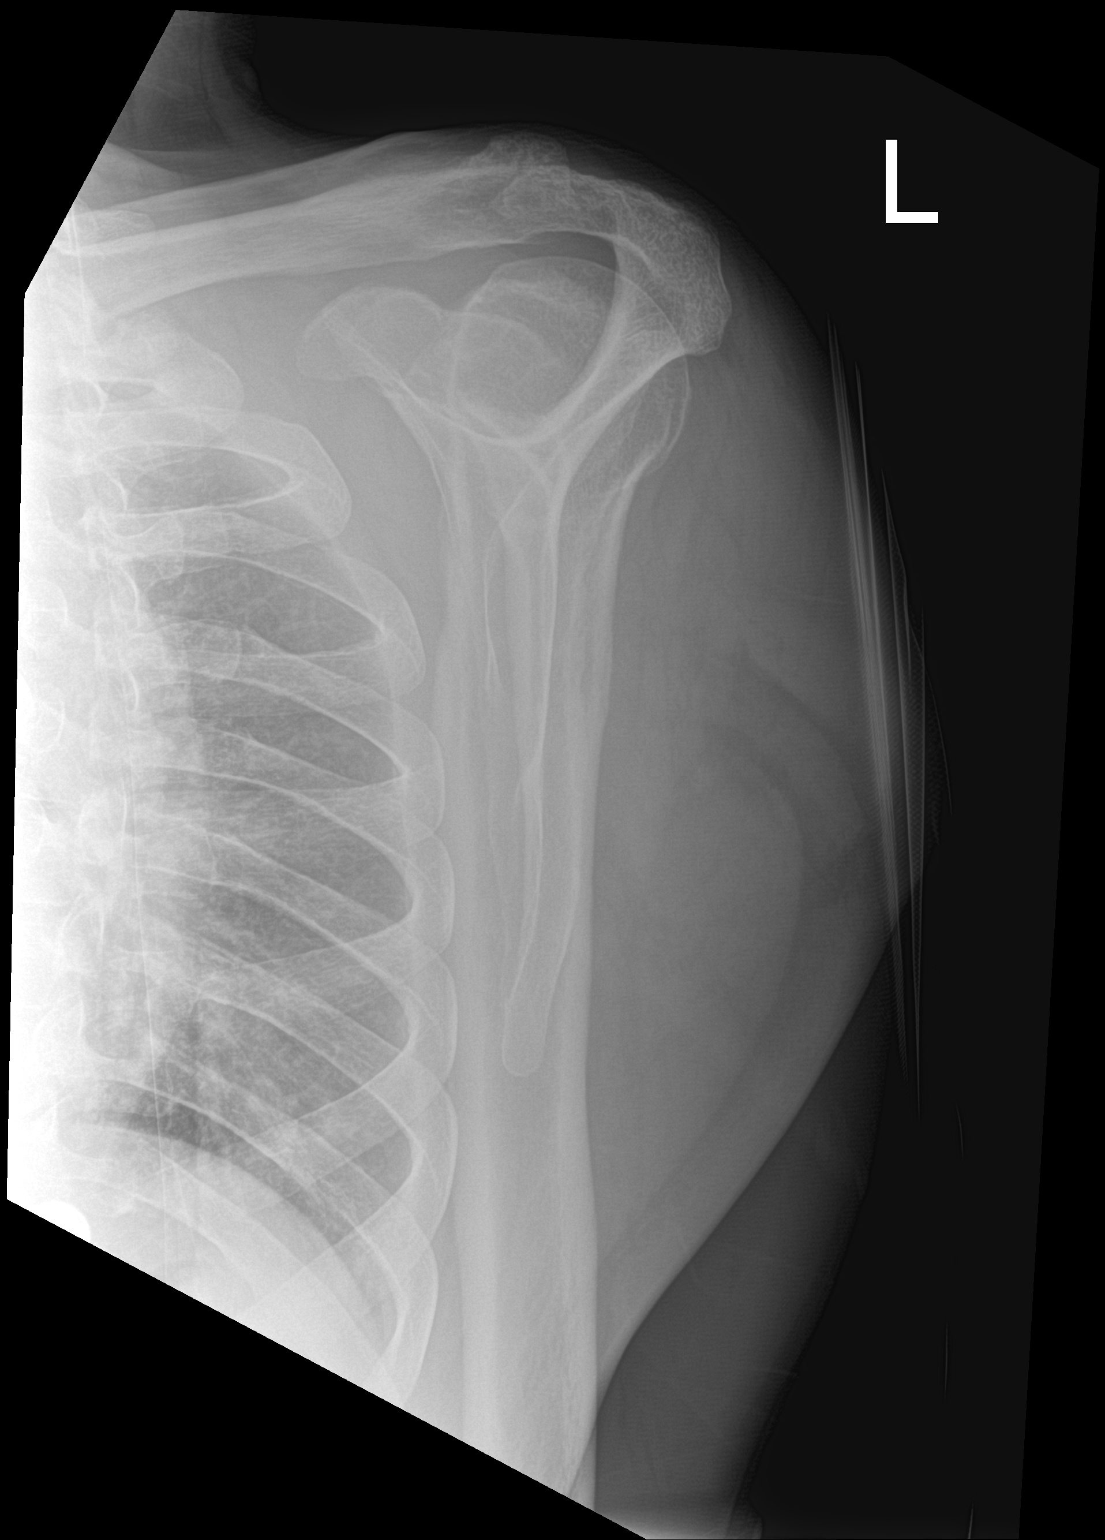

[2 of 2 positions shown; findings below may reference images not displayed]

FINDINGS: No acute fracture or dislocation. No aggressive osseous lesion.
Normal alignment. Generalized osteopenia. Mild arthropathy of the
acromioclavicular joint. Glenohumeral joint space is maintained.

Soft tissue are unremarkable. No radiopaque foreign body or soft
tissue emphysema.
IMPRESSION: 1. No acute osseous injury of the left shoulder.

## 2021-07-05 ENCOUNTER — Ambulatory Visit: Payer: Medicaid Other | Admitting: Podiatry

## 2021-09-06 ENCOUNTER — Ambulatory Visit: Payer: Medicaid Other | Admitting: Cardiology

## 2021-09-09 ENCOUNTER — Emergency Department (HOSPITAL_COMMUNITY)
Admission: EM | Admit: 2021-09-09 | Discharge: 2021-09-10 | Disposition: A | Discharge status: Home / Self Care | Payer: Medicaid Other | Attending: Emergency Medicine | Admitting: Emergency Medicine

## 2021-09-09 ENCOUNTER — Encounter (HOSPITAL_COMMUNITY): Payer: Self-pay | Admitting: Emergency Medicine

## 2021-09-09 ENCOUNTER — Emergency Department (HOSPITAL_COMMUNITY): Payer: Medicaid Other

## 2021-09-09 DIAGNOSIS — S0081XA Abrasion of other part of head, initial encounter: Secondary | ICD-10-CM

## 2021-09-09 DIAGNOSIS — T1490XA Injury, unspecified, initial encounter: Secondary | ICD-10-CM

## 2021-09-09 DIAGNOSIS — F10920 Alcohol use, unspecified with intoxication, uncomplicated: Secondary | ICD-10-CM

## 2021-09-09 DIAGNOSIS — S0181XA Laceration without foreign body of other part of head, initial encounter: Secondary | ICD-10-CM

## 2021-09-09 DIAGNOSIS — S01412A Laceration without foreign body of left cheek and temporomandibular area, initial encounter: Secondary | ICD-10-CM | POA: Insufficient documentation

## 2021-09-09 DIAGNOSIS — F10129 Alcohol abuse with intoxication, unspecified: Secondary | ICD-10-CM | POA: Insufficient documentation

## 2021-09-09 DIAGNOSIS — I209 Angina pectoris, unspecified: Secondary | ICD-10-CM | POA: Insufficient documentation

## 2021-09-09 DIAGNOSIS — Y908 Blood alcohol level of 240 mg/100 ml or more: Secondary | ICD-10-CM | POA: Insufficient documentation

## 2021-09-09 DIAGNOSIS — I10 Essential (primary) hypertension: Secondary | ICD-10-CM | POA: Insufficient documentation

## 2021-09-09 DIAGNOSIS — J45909 Unspecified asthma, uncomplicated: Secondary | ICD-10-CM | POA: Insufficient documentation

## 2021-09-09 DIAGNOSIS — S0990XA Unspecified injury of head, initial encounter: Secondary | ICD-10-CM | POA: Diagnosis present

## 2021-09-09 DIAGNOSIS — Z23 Encounter for immunization: Secondary | ICD-10-CM | POA: Diagnosis not present

## 2021-09-09 DIAGNOSIS — E119 Type 2 diabetes mellitus without complications: Secondary | ICD-10-CM | POA: Diagnosis not present

## 2021-09-09 DIAGNOSIS — F17210 Nicotine dependence, cigarettes, uncomplicated: Secondary | ICD-10-CM | POA: Insufficient documentation

## 2021-09-09 DIAGNOSIS — R4182 Altered mental status, unspecified: Secondary | ICD-10-CM | POA: Insufficient documentation

## 2021-09-09 DIAGNOSIS — J449 Chronic obstructive pulmonary disease, unspecified: Secondary | ICD-10-CM | POA: Insufficient documentation

## 2021-09-09 LAB — RAPID URINE DRUG SCREEN, HOSP PERFORMED
Amphetamines: NOT DETECTED
Barbiturates: NOT DETECTED
Benzodiazepines: NOT DETECTED
Cocaine: NOT DETECTED
Opiates: NOT DETECTED
Tetrahydrocannabinol: NOT DETECTED

## 2021-09-09 LAB — COMPREHENSIVE METABOLIC PANEL
ALT: 22 U/L (ref 0–44)
AST: 34 U/L (ref 15–41)
Albumin: 3.8 g/dL (ref 3.5–5.0)
Alkaline Phosphatase: 55 U/L (ref 38–126)
Anion gap: 10 (ref 5–15)
BUN: 25 mg/dL — ABNORMAL HIGH (ref 6–20)
CO2: 23 mmol/L (ref 22–32)
Calcium: 9.4 mg/dL (ref 8.9–10.3)
Chloride: 103 mmol/L (ref 98–111)
Creatinine, Ser: 1.61 mg/dL — ABNORMAL HIGH (ref 0.61–1.24)
GFR, Estimated: 49 mL/min — ABNORMAL LOW (ref >60)
Glucose, Bld: 131 mg/dL — ABNORMAL HIGH (ref 70–99)
Potassium: 4.4 mmol/L (ref 3.5–5.1)
Sodium: 136 mmol/L (ref 135–145)
Total Bilirubin: 1 mg/dL (ref 0.3–1.2)
Total Protein: 7 g/dL (ref 6.5–8.1)

## 2021-09-09 LAB — URINALYSIS, ROUTINE W REFLEX MICROSCOPIC
Bacteria, UA: NONE SEEN
Bilirubin Urine: NEGATIVE
Glucose, UA: NEGATIVE mg/dL
Hgb urine dipstick: NEGATIVE
Ketones, ur: NEGATIVE mg/dL
Leukocytes,Ua: NEGATIVE
Nitrite: NEGATIVE
Protein, ur: 30 mg/dL — AB
Specific Gravity, Urine: 1.004 — ABNORMAL LOW (ref 1.005–1.030)
pH: 5 (ref 5.0–8.0)

## 2021-09-09 LAB — CBC
HCT: 42.5 % (ref 39.0–52.0)
Hemoglobin: 14 g/dL (ref 13.0–17.0)
MCH: 30.6 pg (ref 26.0–34.0)
MCHC: 32.9 g/dL (ref 30.0–36.0)
MCV: 92.8 fL (ref 80.0–100.0)
Platelets: 261 10*3/uL (ref 150–400)
RBC: 4.58 MIL/uL (ref 4.22–5.81)
RDW: 16.2 % — ABNORMAL HIGH (ref 11.5–15.5)
WBC: 6.7 10*3/uL (ref 4.0–10.5)
nRBC: 0 % (ref 0.0–0.2)

## 2021-09-09 LAB — I-STAT CHEM 8, ED
BUN: 26 mg/dL — ABNORMAL HIGH (ref 6–20)
Calcium, Ion: 1.08 mmol/L — ABNORMAL LOW (ref 1.15–1.40)
Chloride: 105 mmol/L (ref 98–111)
Creatinine, Ser: 2.1 mg/dL — ABNORMAL HIGH (ref 0.61–1.24)
Glucose, Bld: 125 mg/dL — ABNORMAL HIGH (ref 70–99)
HCT: 40 % (ref 39.0–52.0)
Hemoglobin: 13.6 g/dL (ref 13.0–17.0)
Potassium: 4.1 mmol/L (ref 3.5–5.1)
Sodium: 138 mmol/L (ref 135–145)
TCO2: 23 mmol/L (ref 22–32)

## 2021-09-09 LAB — ETHANOL: Alcohol, Ethyl (B): 280 mg/dL — ABNORMAL HIGH (ref <10)

## 2021-09-09 LAB — PROTIME-INR
INR: 0.9 (ref 0.8–1.2)
Prothrombin Time: 12.1 seconds (ref 11.4–15.2)

## 2021-09-09 LAB — LACTIC ACID, PLASMA: Lactic Acid, Venous: 2.3 mmol/L (ref 0.5–1.9)

## 2021-09-09 LAB — SAMPLE TO BLOOD BANK

## 2021-09-09 MED ORDER — TETANUS-DIPHTH-ACELL PERTUSSIS 5-2.5-18.5 LF-MCG/0.5 IM SUSY
0.5000 mL | PREFILLED_SYRINGE | Freq: Once | INTRAMUSCULAR | Status: AC
  Administered 2021-09-09: 0.5 mL via INTRAMUSCULAR
  Filled 2021-09-09: qty 0.5

## 2021-09-09 MED ORDER — LORAZEPAM 2 MG/ML IJ SOLN
1.0000 mg | Freq: Once | INTRAMUSCULAR | Status: AC
  Administered 2021-09-09: 1 mg via INTRAVENOUS
  Filled 2021-09-09: qty 1

## 2021-09-09 MED ORDER — LIDOCAINE-EPINEPHRINE (PF) 2 %-1:200000 IJ SOLN
10.0000 mL | Freq: Once | INTRAMUSCULAR | Status: AC
  Administered 2021-09-09: 10 mL
  Filled 2021-09-09: qty 20

## 2021-09-09 MED ORDER — SODIUM CHLORIDE 0.9 % IV BOLUS
1000.0000 mL | Freq: Once | INTRAVENOUS | Status: AC
  Administered 2021-09-09: 1000 mL via INTRAVENOUS
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
## 2021-09-09 NOTE — ED Notes (Signed)
Patient transported to CT 
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
## 2021-09-09 NOTE — ED Notes (Signed)
Patient account is currently locked, unable to give any information. Eula Fried called asking if patient had arrived, [phone]

## 2021-09-09 NOTE — Progress Notes (Signed)
Orthopedic Tech Progress Note Patient Details:  Darryl Hurley 04/07/1961 [id number]  Patient ID: [name], male   DOB: September 28, 1961, 61 y.o.   MRN: [id number] Attended trauma page Trinna Post 09/09/2021, 9:06 PM
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
## 2021-09-09 NOTE — ED Notes (Addendum)
Darryl Hurley wife Darryl Hurley) [phone] called for a status update--no information given

## 2021-09-10 NOTE — ED Notes (Signed)
Report given to  Sarah, RN.
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
## 2021-09-10 NOTE — ED Notes (Signed)
Pt given breakfast. Lights turned on.

## 2021-09-10 NOTE — ED Notes (Signed)
Pt ambulated to the bathroom.  

## 2021-09-10 NOTE — ED Notes (Signed)
Bacitracin and gauze applied to pt's cleaned facial abrasions.

## 2021-09-10 NOTE — ED Notes (Signed)
Pt finished breakfast. Pt putting on his clothes.

## 2021-09-10 NOTE — ED Notes (Signed)
Called fiance to pick up pt.

## 2021-09-10 NOTE — ED Notes (Signed)
Pt assisted to use urinal.  

## 2021-09-10 NOTE — ED Notes (Signed)
Pt finished his coffee.

## 2021-10-16 ENCOUNTER — Other Ambulatory Visit: Payer: Self-pay

## 2021-10-16 ENCOUNTER — Ambulatory Visit (INDEPENDENT_AMBULATORY_CARE_PROVIDER_SITE_OTHER): Payer: Medicaid Other | Admitting: Podiatry

## 2021-10-16 ENCOUNTER — Encounter: Payer: Self-pay | Admitting: Podiatry

## 2021-10-16 DIAGNOSIS — M79674 Pain in right toe(s): Secondary | ICD-10-CM | POA: Diagnosis not present

## 2021-10-16 DIAGNOSIS — M79675 Pain in left toe(s): Secondary | ICD-10-CM

## 2021-10-16 DIAGNOSIS — B351 Tinea unguium: Secondary | ICD-10-CM

## 2021-10-16 DIAGNOSIS — E1142 Type 2 diabetes mellitus with diabetic polyneuropathy: Secondary | ICD-10-CM | POA: Diagnosis not present

## 2021-10-16 NOTE — Progress Notes (Signed)
This patient returns to my office for at risk foot care.  This patient requires this care by a professional since this patient will be at risk due to having type 2 diabetes.  This patient is unable to cut nails himself since the patient cannot reach his nails.These nails are painful walking and wearing shoes. Patient has not been seen in over 11 months. This patient presents for at risk foot care today.  General Appearance  Alert, conversant and in no acute stress.  Vascular  Dorsalis pedis and posterior tibial  pulses are palpable  bilaterally.  Capillary return is within normal limits  bilaterally. Temperature is within normal limits  bilaterally.  Neurologic  Senn-Weinstein monofilament wire test within normal limits  bilaterally. Muscle power within normal limits bilaterally.  Nails Thick disfigured discolored nails with subungual debris  from hallux to fifth toes bilaterally. No evidence of bacterial infection or drainage bilaterally.  Orthopedic  No limitations of motion  feet .  No crepitus or effusions noted.  No bony pathology or digital deformities noted.  Skin  normotropic skin with no porokeratosis noted bilaterally.  No signs of infections or ulcers noted.     Onychomycosis  Pain in right toes  Pain in left toes  Consent was obtained for treatment procedures.   Mechanical debridement of nails 1-5  bilaterally performed with a nail nipper.  Filed with dremel without incident.    Return office visit   4 months                   Told patient to return for periodic foot care and evaluation due to potential at risk complications.   Helane Gunther DPM

## 2021-11-29 ENCOUNTER — Ambulatory Visit: Payer: Medicaid Other | Admitting: Cardiology

## 2021-11-29 DIAGNOSIS — R9439 Abnormal result of other cardiovascular function study: Secondary | ICD-10-CM | POA: Insufficient documentation

## 2021-11-29 NOTE — Progress Notes (Incomplete)
Patient is here for follow up visit.  Subjective:   Darryl Hurley, male    DOB: [birth year], 60 y.o.   MRN: [id number]   No chief complaint on file.  60 -year-old African-American male with hypertension, type II diabetes mellitus,nicotine dependence  ***Patient was recently seen by my partner Dr Terri Skains for chest pain. He underwent stress test, details below. Patient returns for follow up today, denies any chest pain since last visit. Blood pressure elevated. He is trying to quit smoking. He denies any recent cocaine use.    Current Outpatient Medications on File Prior to Visit  Medication Sig Dispense Refill   acetaminophen (TYLENOL) 325 MG tablet Take 2 tablets (650 mg total) by mouth every 6 (six) hours as needed. (Patient taking differently: Take 325 mg by mouth every 6 (six) hours as needed for moderate pain.)     albuterol (PROVENTIL HFA;VENTOLIN HFA) 108 (90 BASE) MCG/ACT inhaler Inhale 1 puff into the lungs every 6 (six) hours as needed for wheezing or shortness of breath.     allopurinol (ZYLOPRIM) 300 MG tablet Take 300 mg by mouth daily.     amLODipine (NORVASC) 10 MG tablet Take 10 mg by mouth daily.  2   aspirin EC 81 MG tablet Take 1 tablet (81 mg total) by mouth daily. Swallow whole. 90 tablet 3   cloNIDine (CATAPRES) 0.1 MG tablet Take 0.1 mg by mouth 2 (two) times daily.  3   colchicine 0.6 MG tablet Take 0.6 mg by mouth daily as needed. Take 1 tablet (0.6 mg) in 1 hr as needed for gout     fluticasone (FLONASE) 50 MCG/ACT nasal spray Place into both nostrils.     gabapentin (NEURONTIN) 600 MG tablet Take 600 mg by mouth 3 (three) times daily.     HYDROcodone-acetaminophen (NORCO/VICODIN) 5-325 MG tablet Take 1 tablet by mouth every 6 (six) hours as needed for moderate pain. (Patient not taking: Reported on 05/15/2021)     lidocaine (LIDODERM) 5 % Place 1 patch onto the skin daily.     losartan (COZAAR) 100 MG tablet Take 100 mg by mouth daily.      metoprolol succinate (TOPROL-XL) 50 MG 24 hr tablet Take 1 tablet (50 mg total) by mouth daily. Take with or immediately following a meal. 90 tablet 3   nitroGLYCERIN (NITROSTAT) 0.4 MG SL tablet Place 1 tablet (0.4 mg total) under the tongue every 5 (five) minutes x 3 doses as needed for chest pain. 30 tablet 3   oxyCODONE (OXY IR/ROXICODONE) 5 MG immediate release tablet Take 5 mg by mouth every 6 (six) hours as needed for severe pain.     OXYGEN Inhale into the lungs. 2 liters (Patient not taking: Reported on 05/15/2021)     rosuvastatin (CRESTOR) 40 MG tablet Take 40 mg by mouth daily.     SYMBICORT 160-4.5 MCG/ACT inhaler Inhale 2 puffs into the lungs 2 (two) times daily.     [DISCONTINUED] labetalol (NORMODYNE) 200 MG tablet Take 1 tablet (200 mg total) by mouth 2 (two) times daily. (Patient not taking: Reported on 03/25/2019) 60 tablet 0   No current facility-administered medications on file prior to visit.    Cardiovascular studies:  Echocardiogram 03/30/2021:  Normal LV systolic function with visual EF 55-60%. Left ventricle cavity  is normal in size. Normal global wall motion. Normal diastolic filling  pattern, normal LAP.  Structurally normal tricuspid valve. No evidence of tricuspid stenosis.  Mild tricuspid regurgitation.  Compared to  prior study dated 03/18/2019: LVEF improved from 45-50% to  55-60%.   Lexiscan Tetrofosmin stress test 02/27/2021: 1 Day Rest/Stress Protocol. Stress EKG is non-diagnostic, as this is pharmacological stress test using Lexiscan. Medium size, mild intensity perfusion defect suggestive of reversible ischemia in the LCx/RCA distribution. Small size, mild intensity, fixed perfusion defect involving the basal inferior and inferolateral segments consistent with diaphragmatic attenuation. TID ratio: 0.93 Left ventricular size: Dilated (EDV 129 cc). Calculated LVEF 46% with mild hypokinesis of the inferior and inferolateral segments. Intermediate risk  study, clinical correlation requested. No prior studies available for comparison.   EKG 08/07/2019: Sinus tachycardia 102 bpm. Possible old inferior infarct.  Left atrial enlargement.   RLE venous duplex US 09/2018: Final Interpretation: Right: Findings consistent with acute deep vein thrombosis involving the right peroneal vein, and right posterior tibial vein. No cystic structure found in the popliteal fossa. Left: No evidence of common femoral vein obstruction   Echocardiogram 02/11/2018: Left ventricle cavity is normal in size. Mild concentric hypertrophy of the left ventricle. Mild decrease in global wall motion. Visual EF is 45-50%. Doppler evidence of grade I (impaired) diastolic dysfunction, normal LAP. Calculated EF 45%. Left atrial cavity is mildly dilated. Inadequate tricuspid regurgitation jet to estimate pulmonary artery pressure IVC is dilated with blunted respiratory response. Estimated RA pressure 10-15 mmHg.  Lexiscan myoview stress test 01/31/2018:  1. Pharmacologic stress testing was performed with intravenous administration of .4 mg of Lexiscan over a 10-15 seconds infusion. Patient was hypertensive throughout the study, peak BP 200/120 mmHg.  Stress symptoms included dyspnea, dizziness.  2. Exercise capacity not assessed. Stress EKG is non diagnostic for ischemia as it is a pharmacologic stress.  3. The overall quality of the study is good.  Left ventricular cavity is noted to be normal on the rest and stress studies.  Gated SPECT images reveal normal myocardial thickening and wall motion.  The left ventricular ejection fraction was calculated or visually estimated to be 41%.  SPECT images demonstrate small perfusion abnormality of mild intensity in the mid inferolateral myocardial wall(s) on the stress images with mild reversibility on rest images.  4. Low to intermediate risk study.  Recent labs: 02/11/2021: Glucose 103, BUN/Cr 12/1.28. EGFR >60. Na/K 139/3.8. Rest of  the CMP normal H/H 14/43. MCV 89. Platelets 306 TSH N/A Trop HS 11-->15  11/2019: HbA1C 6.9%  03/2019: Chol 193, TG 145, HDL 51, LDL 113  Review of Systems  Constitutional: Negative for decreased appetite, malaise/fatigue, weight gain and weight loss.  HENT:  Negative for congestion.   Eyes:  Negative for visual disturbance.  Cardiovascular:  Negative for chest pain, dyspnea on exertion, leg swelling, palpitations and syncope.  Respiratory:  Negative for shortness of breath.   Endocrine: Negative for cold intolerance.  Hematologic/Lymphatic: Does not bruise/bleed easily.  Skin:  Negative for itching and rash.  Musculoskeletal:  Negative for myalgias.       H/o DVT  Gastrointestinal:  Negative for abdominal pain, nausea and vomiting.  Genitourinary:  Negative for dysuria.  Neurological:  Negative for dizziness and weakness.  Psychiatric/Behavioral:  Positive for substance abuse (Alcohol). The patient is not nervous/anxious.   All other systems reviewed and are negative.     Objective:    There were no vitals filed for this visit.    Physical Exam Vitals and nursing note reviewed.  Constitutional:      General: He is not in acute distress. Neck:     Vascular: No JVD.  Cardiovascular:  Rate and Rhythm: Normal rate and regular rhythm.     Heart sounds: Normal heart sounds. No murmur heard. Pulmonary:     Effort: Pulmonary effort is normal.     Breath sounds: Normal breath sounds. No wheezing or rales.        Assessment & Recommendations:   11 -year-old African-American male with hypertension, type II diabetes mellitus, tobacco abuse, gout, midly reduced LVEF and mildly abnormal stress test (01/2018), h/o DVT.  ***Chest pain, abnormal stress test: No recurrence since one episode few weeks ago. Stress test abnormality noted. Reasonable to optimize medical therapy and cath only if recurrence of symptoms. Added metoprolol succinate 50 mg daily. Cautioned against any  concurrent use of cocaine.  Added Aspirin. Discontinue meloxicam. Continue statin.   ***Hypertension Uncontrolled. Added metoprolol.  Monitor for any severe bradycardia given concurrent use of clonidine.   F/u in 3 months  [place], MD [facility] Pager: [phone] Office: [phone] If no answer Cell [phone]

## 2021-12-25 ENCOUNTER — Encounter: Payer: Self-pay | Admitting: Cardiology

## 2021-12-25 ENCOUNTER — Telehealth: Payer: Self-pay | Admitting: Cardiology

## 2021-12-25 ENCOUNTER — Ambulatory Visit: Payer: Medicaid Other | Admitting: Cardiology

## 2021-12-25 NOTE — Telephone Encounter (Signed)
Patient is being dismissed from [facility] due to multiple No Show visits.  Dismissal letter was sent 12/25/21

## 2022-02-04 ENCOUNTER — Observation Stay (HOSPITAL_COMMUNITY): Payer: Medicaid Other

## 2022-02-04 ENCOUNTER — Other Ambulatory Visit: Payer: Self-pay

## 2022-02-04 ENCOUNTER — Emergency Department (HOSPITAL_COMMUNITY): Payer: Medicaid Other

## 2022-02-04 ENCOUNTER — Encounter (HOSPITAL_COMMUNITY): Payer: Self-pay | Admitting: Internal Medicine

## 2022-02-04 ENCOUNTER — Inpatient Hospital Stay (HOSPITAL_COMMUNITY)
Admission: EM | Admit: 2022-02-04 | Discharge: 2022-02-07 | DRG: 558 | Disposition: A | Discharge status: Home / Self Care | Payer: Medicaid Other | Attending: Internal Medicine | Admitting: Internal Medicine

## 2022-02-04 DIAGNOSIS — I639 Cerebral infarction, unspecified: Secondary | ICD-10-CM | POA: Insufficient documentation

## 2022-02-04 DIAGNOSIS — R531 Weakness: Secondary | ICD-10-CM

## 2022-02-04 DIAGNOSIS — M25462 Effusion, left knee: Secondary | ICD-10-CM | POA: Diagnosis not present

## 2022-02-04 DIAGNOSIS — M25562 Pain in left knee: Secondary | ICD-10-CM | POA: Diagnosis not present

## 2022-02-04 DIAGNOSIS — M545 Low back pain, unspecified: Secondary | ICD-10-CM | POA: Diagnosis present

## 2022-02-04 DIAGNOSIS — M75122 Complete rotator cuff tear or rupture of left shoulder, not specified as traumatic: Secondary | ICD-10-CM | POA: Diagnosis present

## 2022-02-04 DIAGNOSIS — I10 Essential (primary) hypertension: Secondary | ICD-10-CM

## 2022-02-04 DIAGNOSIS — M19012 Primary osteoarthritis, left shoulder: Secondary | ICD-10-CM | POA: Diagnosis present

## 2022-02-04 DIAGNOSIS — G8194 Hemiplegia, unspecified affecting left nondominant side: Secondary | ICD-10-CM | POA: Diagnosis present

## 2022-02-04 DIAGNOSIS — Z20822 Contact with and (suspected) exposure to covid-19: Secondary | ICD-10-CM | POA: Diagnosis present

## 2022-02-04 DIAGNOSIS — M75102 Unspecified rotator cuff tear or rupture of left shoulder, not specified as traumatic: Secondary | ICD-10-CM | POA: Diagnosis present

## 2022-02-04 DIAGNOSIS — J449 Chronic obstructive pulmonary disease, unspecified: Secondary | ICD-10-CM | POA: Diagnosis present

## 2022-02-04 DIAGNOSIS — Z8249 Family history of ischemic heart disease and other diseases of the circulatory system: Secondary | ICD-10-CM

## 2022-02-04 DIAGNOSIS — Z825 Family history of asthma and other chronic lower respiratory diseases: Secondary | ICD-10-CM

## 2022-02-04 DIAGNOSIS — Z886 Allergy status to analgesic agent status: Secondary | ICD-10-CM

## 2022-02-04 DIAGNOSIS — M7502 Adhesive capsulitis of left shoulder: Principal | ICD-10-CM

## 2022-02-04 DIAGNOSIS — M109 Gout, unspecified: Secondary | ICD-10-CM

## 2022-02-04 DIAGNOSIS — Z818 Family history of other mental and behavioral disorders: Secondary | ICD-10-CM

## 2022-02-04 DIAGNOSIS — I129 Hypertensive chronic kidney disease with stage 1 through stage 4 chronic kidney disease, or unspecified chronic kidney disease: Secondary | ICD-10-CM | POA: Diagnosis present

## 2022-02-04 DIAGNOSIS — Z79899 Other long term (current) drug therapy: Secondary | ICD-10-CM

## 2022-02-04 DIAGNOSIS — E119 Type 2 diabetes mellitus without complications: Secondary | ICD-10-CM | POA: Diagnosis not present

## 2022-02-04 DIAGNOSIS — R29810 Facial weakness: Secondary | ICD-10-CM | POA: Diagnosis present

## 2022-02-04 DIAGNOSIS — M1990 Unspecified osteoarthritis, unspecified site: Secondary | ICD-10-CM

## 2022-02-04 DIAGNOSIS — Z9981 Dependence on supplemental oxygen: Secondary | ICD-10-CM

## 2022-02-04 DIAGNOSIS — E1122 Type 2 diabetes mellitus with diabetic chronic kidney disease: Secondary | ICD-10-CM | POA: Diagnosis present

## 2022-02-04 DIAGNOSIS — Z86718 Personal history of other venous thrombosis and embolism: Secondary | ICD-10-CM

## 2022-02-04 DIAGNOSIS — E785 Hyperlipidemia, unspecified: Secondary | ICD-10-CM | POA: Diagnosis present

## 2022-02-04 DIAGNOSIS — R569 Unspecified convulsions: Secondary | ICD-10-CM | POA: Diagnosis present

## 2022-02-04 DIAGNOSIS — M1712 Unilateral primary osteoarthritis, left knee: Secondary | ICD-10-CM | POA: Diagnosis present

## 2022-02-04 DIAGNOSIS — N189 Chronic kidney disease, unspecified: Secondary | ICD-10-CM | POA: Diagnosis present

## 2022-02-04 DIAGNOSIS — M25512 Pain in left shoulder: Secondary | ICD-10-CM | POA: Diagnosis not present

## 2022-02-04 DIAGNOSIS — Z888 Allergy status to other drugs, medicaments and biological substances status: Secondary | ICD-10-CM

## 2022-02-04 DIAGNOSIS — E876 Hypokalemia: Secondary | ICD-10-CM | POA: Diagnosis present

## 2022-02-04 DIAGNOSIS — M12812 Other specific arthropathies, not elsewhere classified, left shoulder: Secondary | ICD-10-CM | POA: Diagnosis present

## 2022-02-04 DIAGNOSIS — I6523 Occlusion and stenosis of bilateral carotid arteries: Secondary | ICD-10-CM | POA: Diagnosis present

## 2022-02-04 DIAGNOSIS — F17210 Nicotine dependence, cigarettes, uncomplicated: Secondary | ICD-10-CM | POA: Diagnosis present

## 2022-02-04 DIAGNOSIS — Z7951 Long term (current) use of inhaled steroids: Secondary | ICD-10-CM

## 2022-02-04 DIAGNOSIS — F1010 Alcohol abuse, uncomplicated: Secondary | ICD-10-CM | POA: Diagnosis present

## 2022-02-04 DIAGNOSIS — M11262 Other chondrocalcinosis, left knee: Secondary | ICD-10-CM | POA: Diagnosis present

## 2022-02-04 DIAGNOSIS — N179 Acute kidney failure, unspecified: Secondary | ICD-10-CM | POA: Diagnosis present

## 2022-02-04 DIAGNOSIS — G8929 Other chronic pain: Secondary | ICD-10-CM | POA: Diagnosis present

## 2022-02-04 DIAGNOSIS — R4701 Aphasia: Secondary | ICD-10-CM | POA: Diagnosis present

## 2022-02-04 DIAGNOSIS — Z833 Family history of diabetes mellitus: Secondary | ICD-10-CM

## 2022-02-04 DIAGNOSIS — Z7982 Long term (current) use of aspirin: Secondary | ICD-10-CM

## 2022-02-04 LAB — COMPREHENSIVE METABOLIC PANEL
ALT: 38 U/L (ref 0–44)
AST: 44 U/L — ABNORMAL HIGH (ref 15–41)
Albumin: 3.3 g/dL — ABNORMAL LOW (ref 3.5–5.0)
Alkaline Phosphatase: 68 U/L (ref 38–126)
Anion gap: 11 (ref 5–15)
BUN: 16 mg/dL (ref 6–20)
CO2: 25 mmol/L (ref 22–32)
Calcium: 9.3 mg/dL (ref 8.9–10.3)
Chloride: 102 mmol/L (ref 98–111)
Creatinine, Ser: 1.26 mg/dL — ABNORMAL HIGH (ref 0.61–1.24)
GFR, Estimated: >60 mL/min (ref >60)
Glucose, Bld: 143 mg/dL — ABNORMAL HIGH (ref 70–99)
Potassium: 3.6 mmol/L (ref 3.5–5.1)
Sodium: 138 mmol/L (ref 135–145)
Total Bilirubin: 0.8 mg/dL (ref 0.3–1.2)
Total Protein: 7 g/dL (ref 6.5–8.1)

## 2022-02-04 LAB — RESP PANEL BY RT-PCR (FLU A&B, COVID) ARPGX2
Influenza A by PCR: NEGATIVE
Influenza B by PCR: NEGATIVE
SARS Coronavirus 2 by RT PCR: NEGATIVE

## 2022-02-04 LAB — CBC WITH DIFFERENTIAL/PLATELET
Abs Immature Granulocytes: 0.03 10*3/uL (ref 0.00–0.07)
Basophils Absolute: 0 10*3/uL (ref 0.0–0.1)
Basophils Relative: 0 %
Eosinophils Absolute: 0 10*3/uL (ref 0.0–0.5)
Eosinophils Relative: 0 %
HCT: 40.1 % (ref 39.0–52.0)
Hemoglobin: 13.4 g/dL (ref 13.0–17.0)
Immature Granulocytes: 0 %
Lymphocytes Relative: 13 %
Lymphs Abs: 1.3 10*3/uL (ref 0.7–4.0)
MCH: 30.5 pg (ref 26.0–34.0)
MCHC: 33.4 g/dL (ref 30.0–36.0)
MCV: 91.3 fL (ref 80.0–100.0)
Monocytes Absolute: 1 10*3/uL (ref 0.1–1.0)
Monocytes Relative: 10 %
Neutro Abs: 7.7 10*3/uL (ref 1.7–7.7)
Neutrophils Relative %: 77 %
Platelets: 243 10*3/uL (ref 150–400)
RBC: 4.39 MIL/uL (ref 4.22–5.81)
RDW: 15.6 % — ABNORMAL HIGH (ref 11.5–15.5)
WBC: 10.1 10*3/uL (ref 4.0–10.5)
nRBC: 0 % (ref 0.0–0.2)

## 2022-02-04 LAB — SYNOVIAL CELL COUNT + DIFF, W/ CRYSTALS
Eosinophils-Synovial: 0 % (ref 0–1)
Lymphocytes-Synovial Fld: 0 % (ref 0–20)
Monocyte-Macrophage-Synovial Fluid: 8 % — ABNORMAL LOW (ref 50–90)
Neutrophil, Synovial: 92 % — ABNORMAL HIGH (ref 0–25)
WBC, Synovial: 17450 /mm3 — ABNORMAL HIGH (ref 0–200)

## 2022-02-04 LAB — CBG MONITORING, ED: Glucose-Capillary: 144 mg/dL — ABNORMAL HIGH (ref 70–99)

## 2022-02-04 LAB — GLUCOSE, CAPILLARY: Glucose-Capillary: 202 mg/dL — ABNORMAL HIGH (ref 70–99)

## 2022-02-04 IMAGING — CR DG CHEST 2V
2 series · 2 of 2 positions shown · non-contrast
Comparison: [DATE]

CLINICAL DATA: Left-sided weakness, slurred speech for 6 days

EXAM:
CHEST - 2 VIEW

[chest lat]
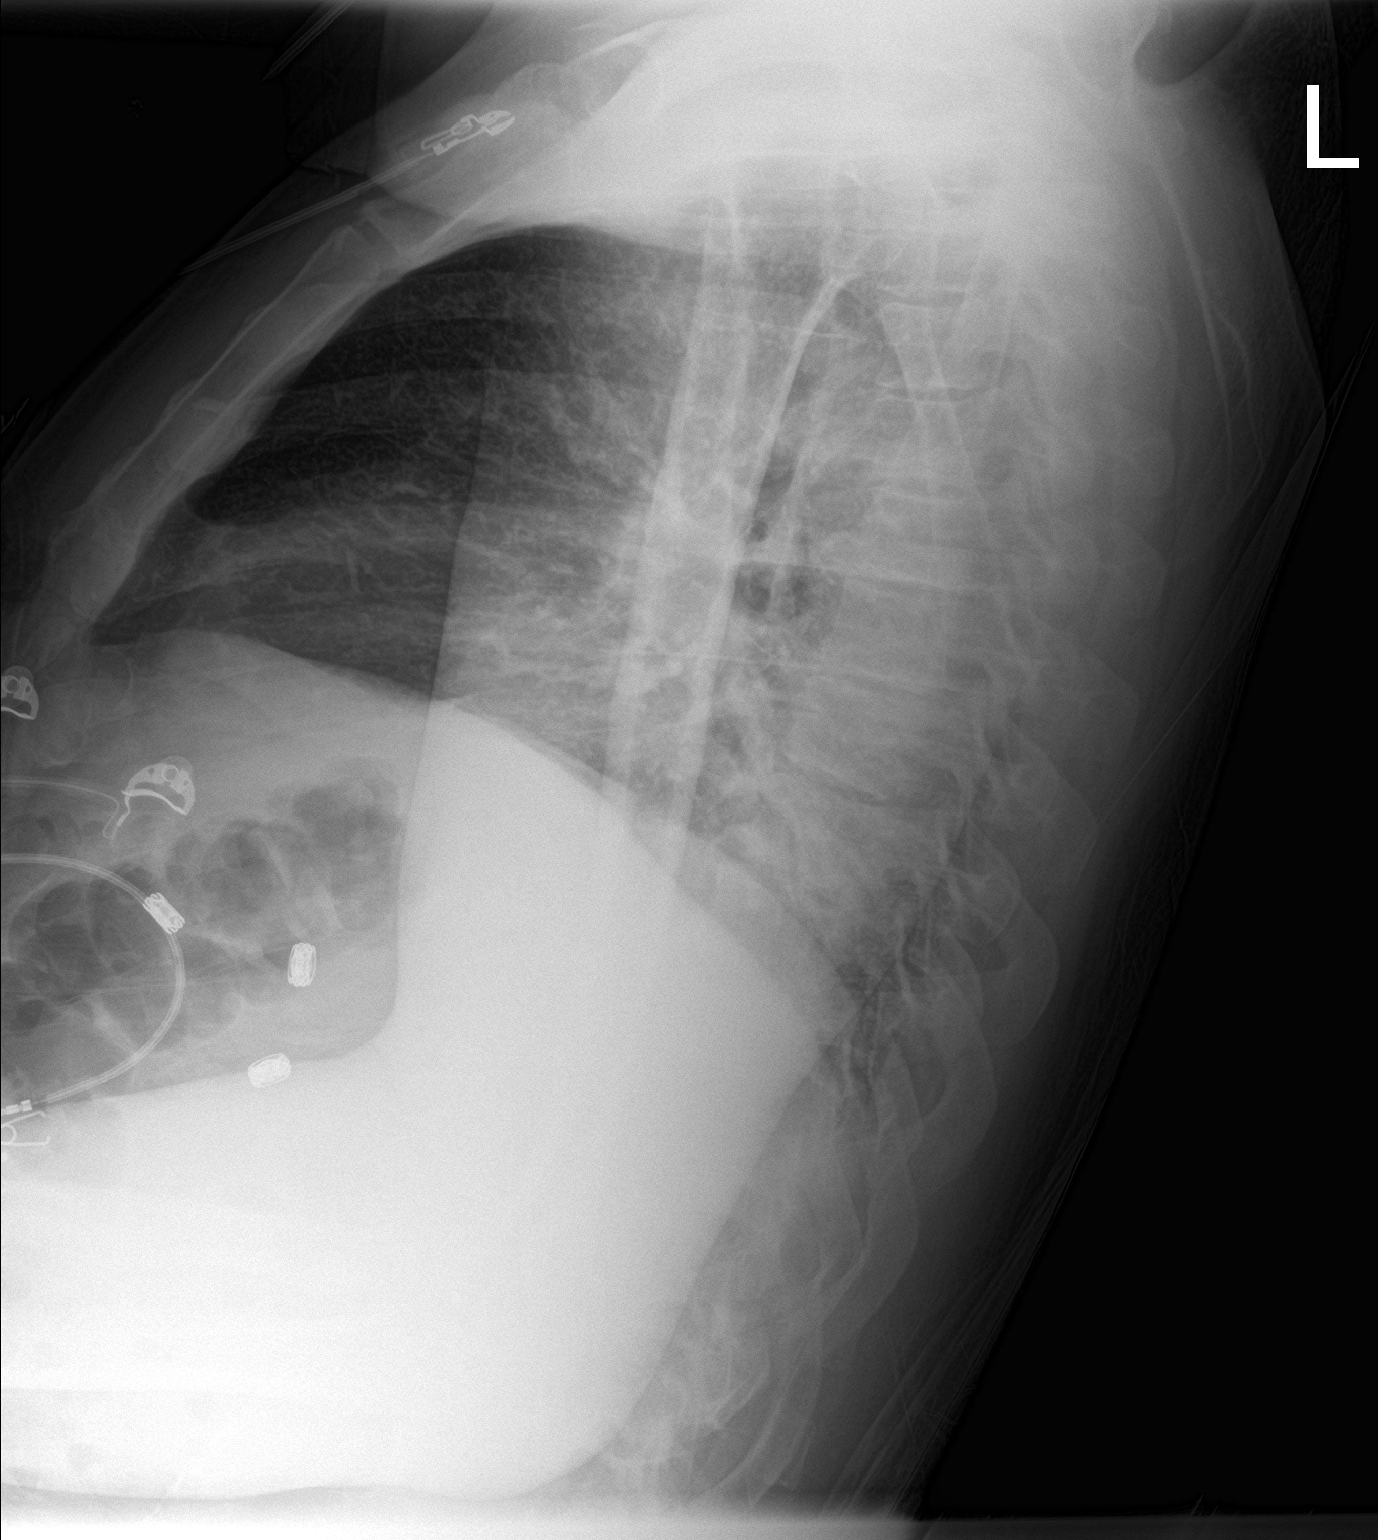

[chest ap]
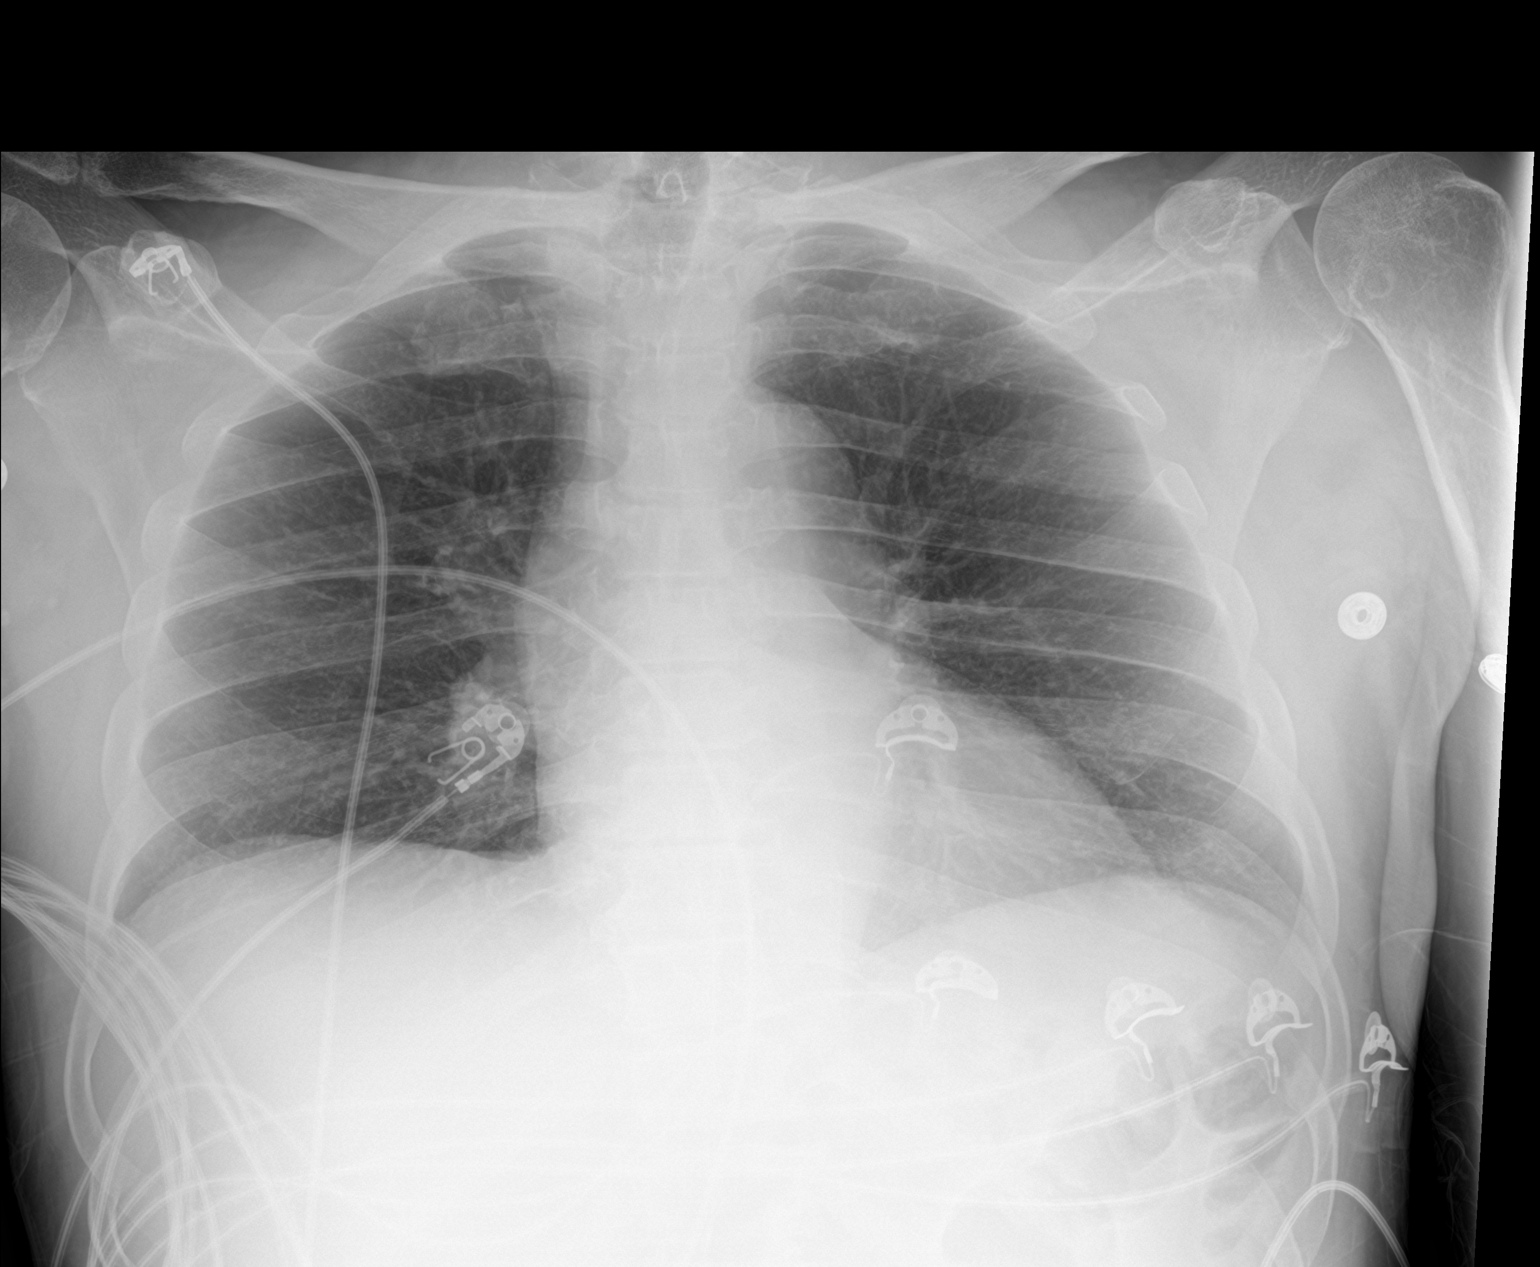

[2 of 2 positions shown; findings below may reference images not displayed]

FINDINGS: The heart size and mediastinal contours are within normal limits.
Both lungs are clear. The visualized skeletal structures are
unremarkable.
IMPRESSION: No active cardiopulmonary disease.

## 2022-02-04 IMAGING — CR DG KNEE COMPLETE 4+V*L*
4 series · 4 of 4 positions shown · non-contrast
Comparison: [DATE]

CLINICAL DATA: Left knee pain

EXAM:
LEFT KNEE - COMPLETE 4+ VIEW

[knee ap]
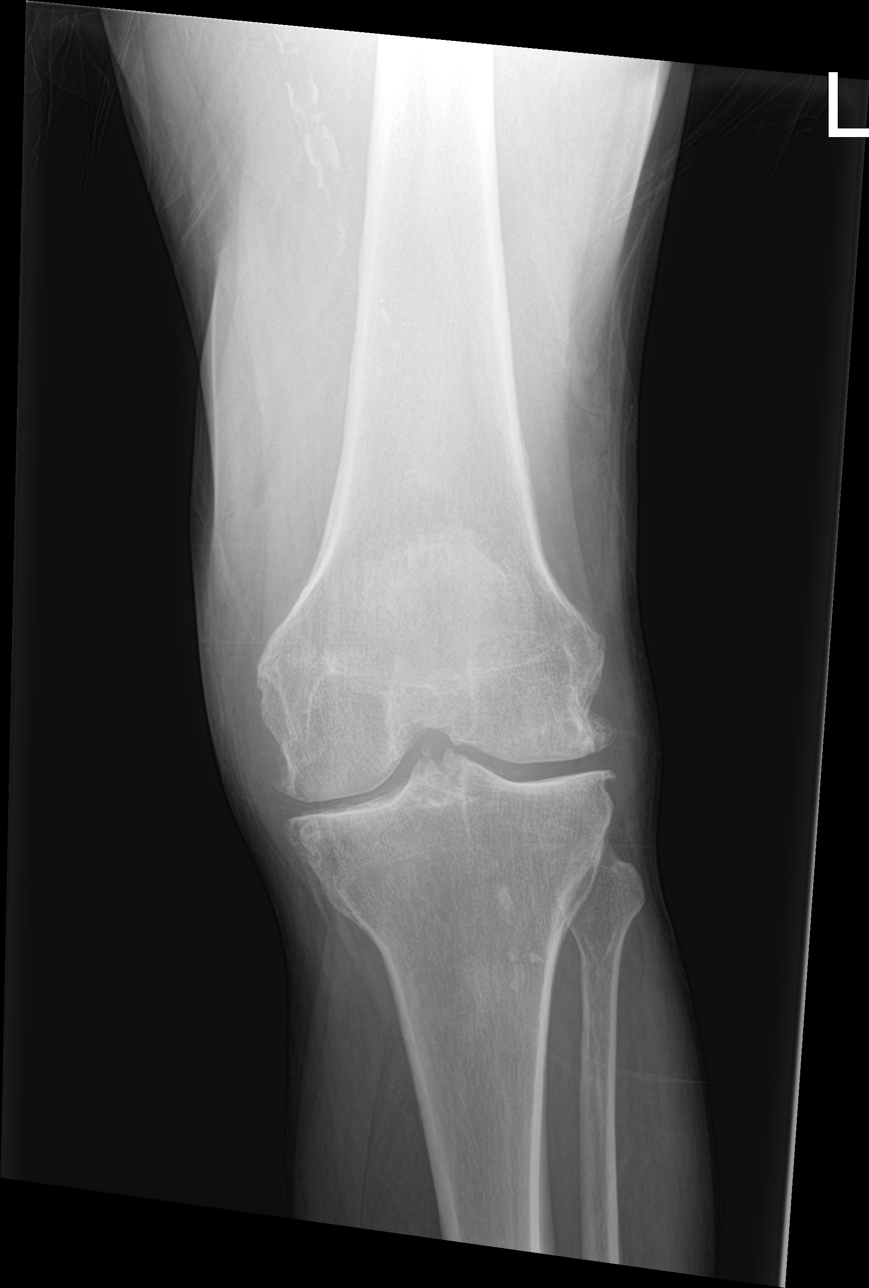

[knee obl (1 of 2)]
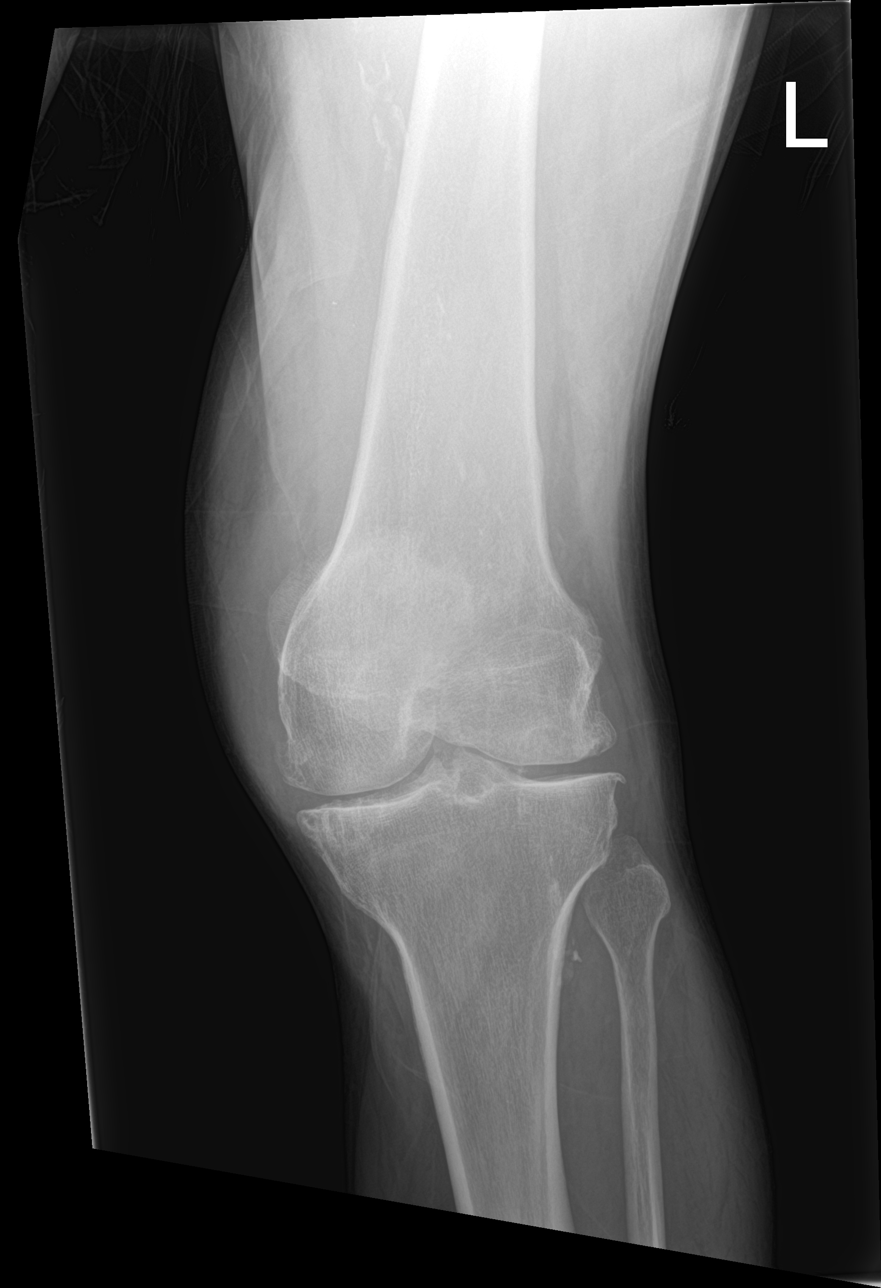

[knee obl (2 of 2)]
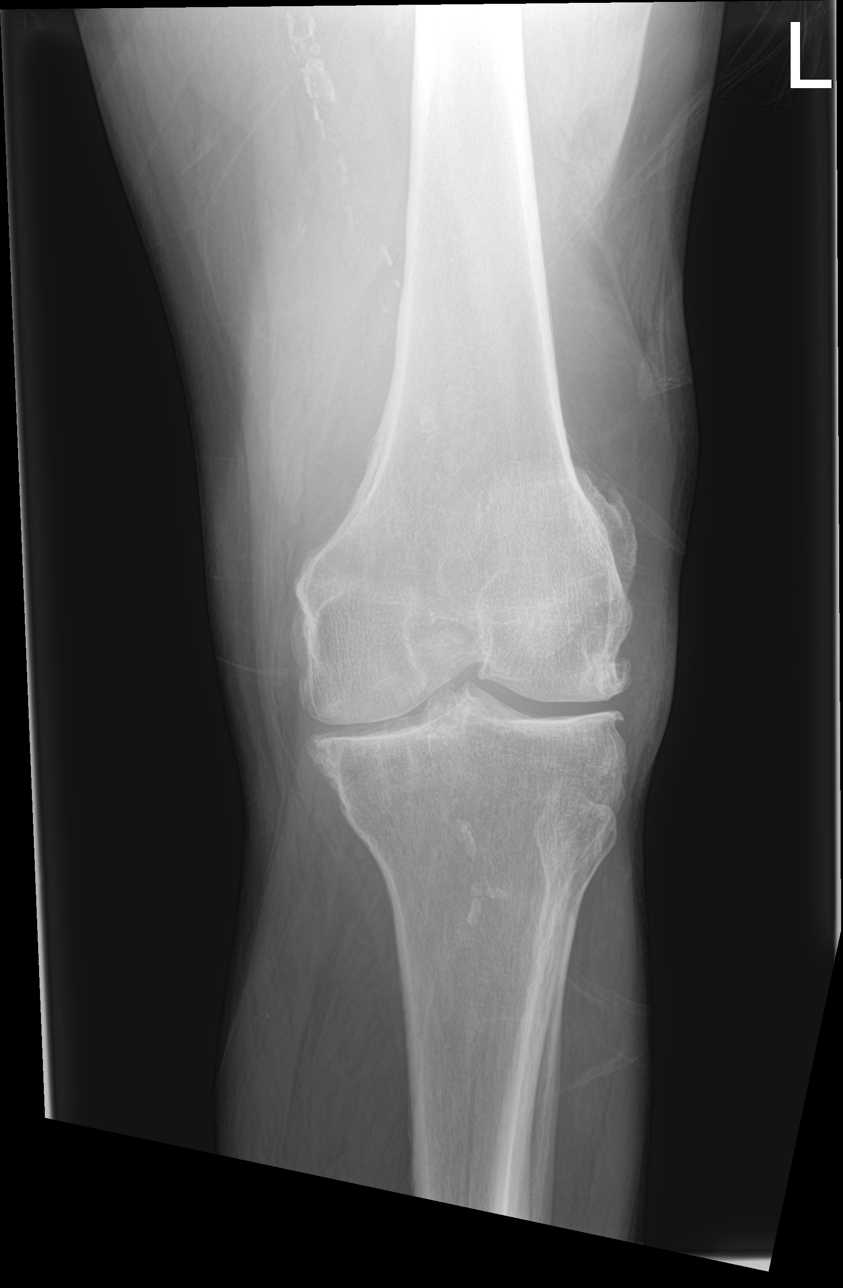

[knee lat]
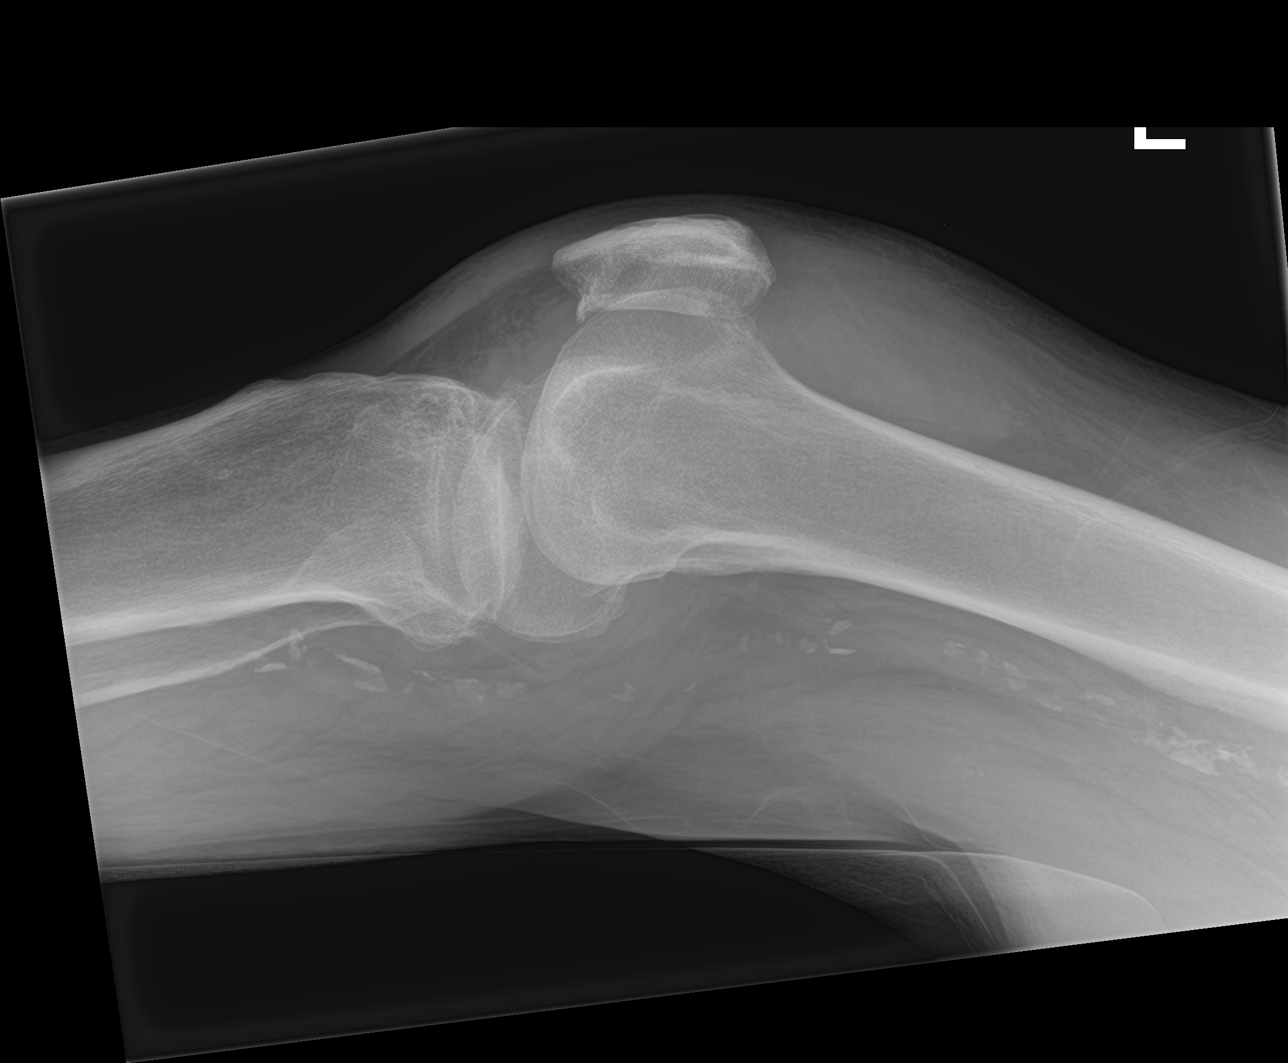

[4 of 4 positions shown; findings below may reference images not displayed]

FINDINGS: No acute fracture or dislocation. No aggressive osseous lesion.
Normal alignment. Generalized osteopenia. Moderate medial
femorotibial compartment osteoarthritis. Mild lateral femorotibial
compartment osteoarthritis. Mild patellofemoral compartment
osteoarthritis. Large joint effusion.

Soft tissue are unremarkable. No radiopaque foreign body or soft
tissue emphysema. Peripheral vascular atherosclerotic disease.
IMPRESSION: 1. Tricompartmental osteoarthritis of the left knee. Large joint
effusion.
2.  No acute osseous injury of the left knee.

## 2022-02-04 IMAGING — CT CT ANGIO HEAD-NECK (W OR W/O PERF)
1 of 11 series · 5 of 33 positions shown · non-contrast
Comparison: Prior head CT examinations [DATE] and earlier.

CLINICAL DATA: Provided history: Neuro deficit, acute, stroke
suspected. Additional history provided: Patient reports 6 days of
left-sided weakness and slurred speech.

EXAM:
CT ANGIOGRAPHY HEAD AND NECK
TECHNIQUE: Multidetector CT imaging of the head and neck was performed using
the standard protocol during bolus administration of intravenous
contrast. Multiplanar CT image reconstructions and MIPs were
obtained to evaluate the vascular anatomy. Carotid stenosis
measurements (when applicable) are obtained utilizing NASCET
criteria, using the distal internal carotid diameter as the
denominator.

[Series 13: ax thins · axial · 0.39mm/px · z∈[-230,-34]mm · 5 of 313 slices shown]
[im 53/313  soft-tissue]
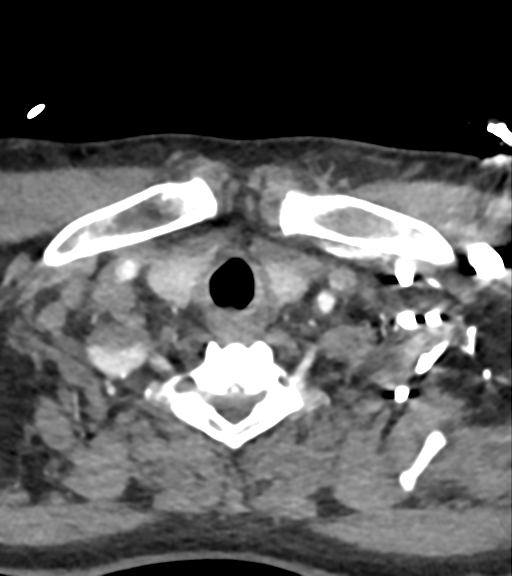
[im 105/313  bone]
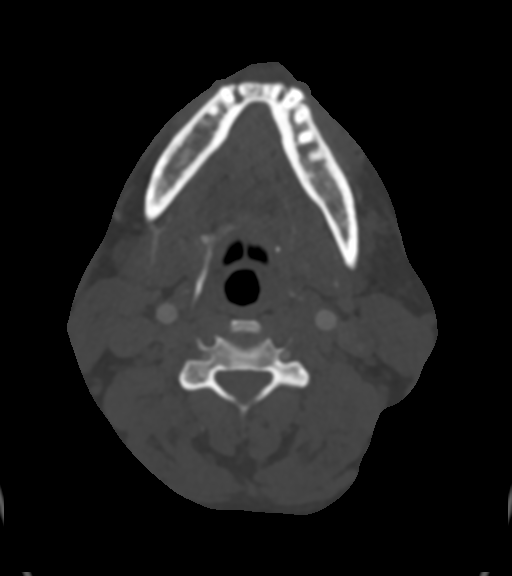
[im 157/313  soft-tissue]
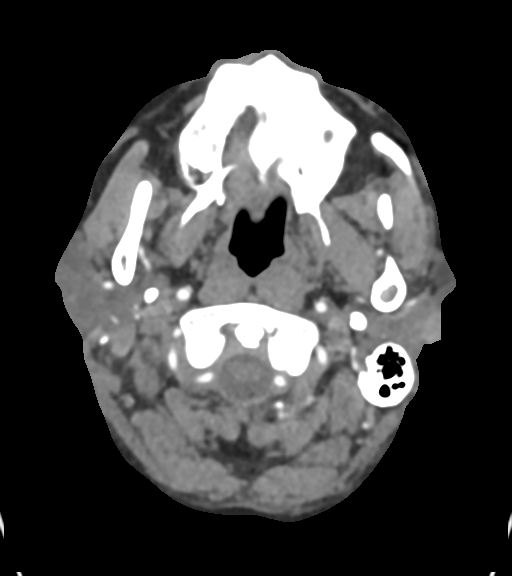
[im 209/313  bone]
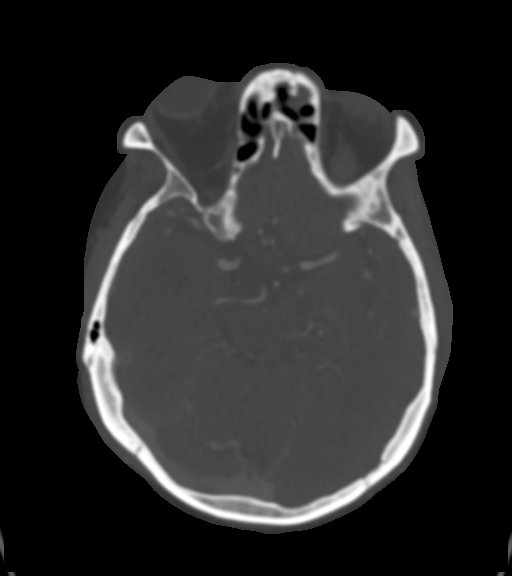
[im 261/313  soft-tissue]
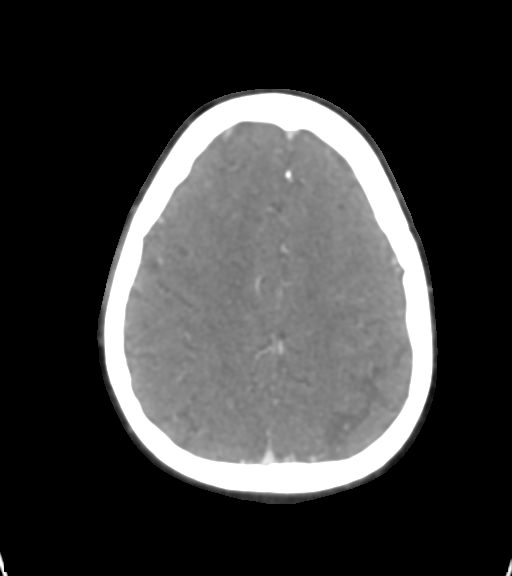

[5 of 33 positions shown; findings below may reference images not displayed]

RADIATION DOSE REDUCTION: This exam was performed according to the
departmental dose-optimization program which includes automated
exposure control, adjustment of the mA and/or kV according to
patient size and/or use of iterative reconstruction technique.

CONTRAST:  75mL OMNIPAQUE IOHEXOL 350 MG/ML SOLN
FINDINGS: CT HEAD FINDINGS

Brain:

Mild generalized parenchymal atrophy.

Mild patchy and ill-defined hypoattenuation within the cerebral
white matter, nonspecific but compatible with chronic small vessel
ischemic disease.

There is no acute intracranial hemorrhage.

No demarcated cortical infarct.

No extra-axial fluid collection.

No evidence of an intracranial mass.

No midline shift.

Vascular: No hyperdense vessel.  Atherosclerotic calcifications.

Skull: Normal. Negative for fracture or focal lesion.

Sinuses: Moderate mucosal thickening within the left maxillary
sinus. Small mucous retention cysts within the right maxillary
sinus. Mild mucosal thickening within the bilateral frontal sinuses.

Orbits: No orbital mass or acute orbital finding. Bilateral
proptosis. Chronic medially displaced fracture deformity of the left
lamina papyracea.

Review of the MIP images confirms the above findings

CTA NECK FINDINGS

Aortic arch: Standard aortic branching. Atherosclerotic plaque
within the visualized aortic arch and proximal major branch vessels
of the neck. Streak and beam hardening artifact arising from a dense
left-sided contrast bolus partially obscures the left subclavian
artery. Within this limitation, there is no appreciable innominate
or proximal subclavian artery stenosis.

Right carotid system: CCA and ICA patent within the neck without
significant stenosis (50% or greater). Moderate soft and calcified
plaque about the carotid bifurcation and within the proximal ICA.

Left carotid system: CCA and ICA patent within the neck without
significant stenosis (50% or greater). Mild soft and calcified
plaque within the CCA, about the carotid bifurcation and within the
proximal ICA.

Vertebral arteries: Vertebral arteries codominant and patent within
the neck without hemodynamically significant stenosis. Mild
calcified plaque within the proximal V2 left vertebral artery.

Skeleton: Cervical spondylosis. Fusion across the C6-C7 disc space.
Reversal of the expected cervical lordosis. No acute bony
abnormality or aggressive osseous lesion.

Other neck: No neck mass or cervical lymphadenopathy.

Upper chest: No consolidation within the imaged lung apices.

Review of the MIP images confirms the above findings

CTA HEAD FINDINGS

Anterior circulation:

The intracranial internal carotid arteries are patent. Moderate
stenosis at the right petrous-cavernous junction. Apparent
moderate-to-severe stenosis within the cavernous left ICA (series
15, image 115). The M1 middle cerebral arteries are patent. No M2
proximal branch occlusion or high-grade proximal stenosis is
identified. The anterior cerebral arteries are patent. Severe
stenosis within the left ACA at the A2/A3 junction. No intracranial
aneurysm is identified. 1-2 mm in thickness-vascular protrusion
arising from the paraclinoid left ICA, which may reflect an
infundibulum or small aneurysm (series 15, image 113).

Posterior circulation:

The intracranial vertebral arteries are patent. Plaque within the
right vertebral artery at the level of the skull base resulting in
at least moderate stenosis. The basilar artery is patent. The
posterior cerebral arteries are patent. Fetal origin right PCA.
Severe stenosis within a right PCA branch at the P2/P3 junction. The
left posterior communicating artery is small or absent.

Venous sinuses: Within the limitations of contrast timing, no
convincing thrombus.

Anatomic variants: As described.

Review of the MIP images confirms the above findings
IMPRESSION: CT head:

1. No evidence of acute intracranial abnormality.
2. Mild chronic small vessel ischemic changes within the cerebral
white matter.
3. Mild generalized parenchymal atrophy.
4. Bilateral proptosis.
5. Paranasal sinus disease, as described.

CTA neck:

1. The common carotid, internal carotid and vertebral arteries are
patent within the neck without hemodynamically significant stenosis.
Atherosclerotic plaque within the common carotid, internal carotid
and left vertebral arteries as described.
2. Cervical spondylosis. Fusion across the C6-C7 disc space
anteriorly.
3. Nonspecific reversal of the expected cervical lordosis.

CTA head:

1. No intracranial large vessel occlusion is identified.
2. Intracranial atherosclerotic disease with multifocal stenoses,
most notably as follows.
3. Moderate stenosis within the right ICA at the petrous-cavernous
junction.
4. Apparent moderate to severe stenosis within the cavernous left
ICA.
5. Severe stenosis within the left anterior cerebral artery at the
A2/A3 junction.
6. Calcified plaque within the right vertebral artery at the level
of the skull base resulting in at least moderate stenosis.
7. Severe stenosis within a right PCA branch at the P2/P3 junction.
8. 1-2 mm inferiorly projecting vascular protrusion arising from the
paraclinoid left ICA, which may reflect an infundibulum or small
aneurysm.

## 2022-02-04 IMAGING — MR MR CERVICAL SPINE W/O CM
6 series · 35 of 48 positions shown · non-contrast
Comparison: No prior MRI, correlation is made with CT cervical
spine [DATE]

CLINICAL DATA: Chronic neck pain

EXAM:
MRI CERVICAL SPINE WITHOUT CONTRAST
TECHNIQUE: Multiplanar, multisequence MR imaging of the cervical spine was
performed. No intravenous contrast was administered.

[Series 5: T2 · sagittal · 3.0mm · 0.69mm/px · 5 of 15 slices shown (1 of 2)]
[im 1/15]
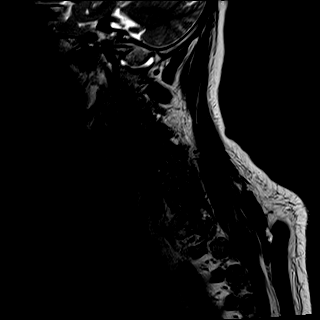
[im 4/15]
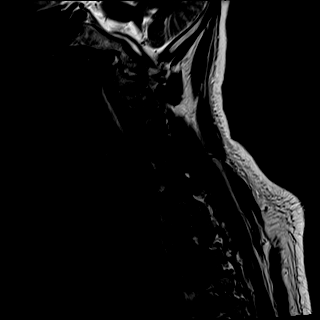
[im 8/15]
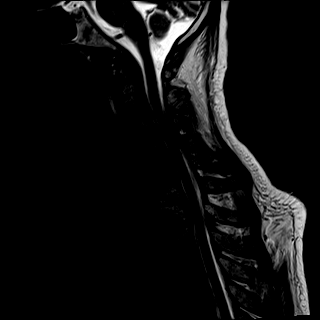
[im 11/15]
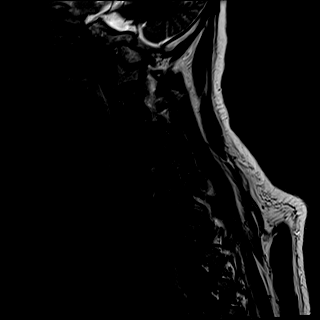
[im 15/15]
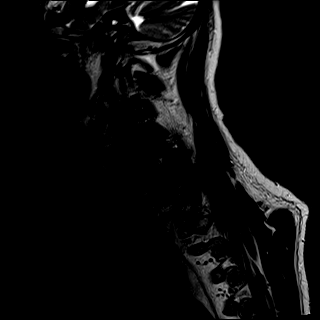

[Series 6: T1 · sagittal · 3.0mm · 0.69mm/px · 5 of 15 slices shown (1 of 2)]
[im 1/15]
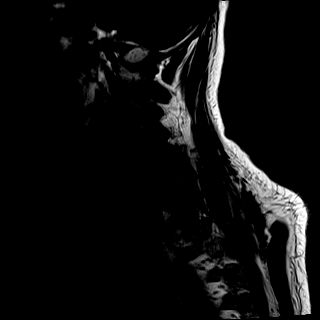
[im 4/15]
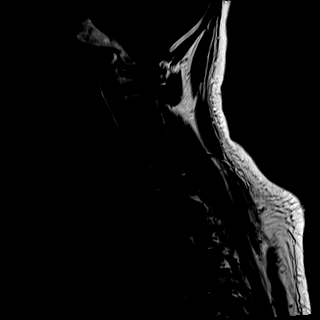
[im 8/15]
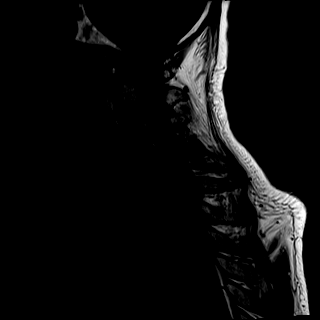
[im 11/15]
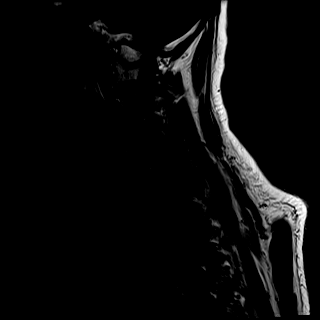
[im 15/15]
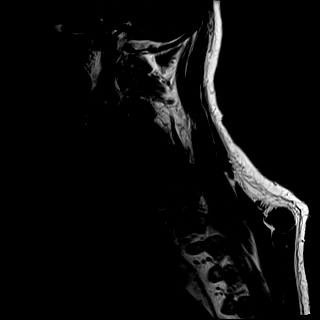

[Series 7: STIR · sagittal · 3.0mm · 0.86mm/px · 5 of 15 slices shown]
[im 1/15]
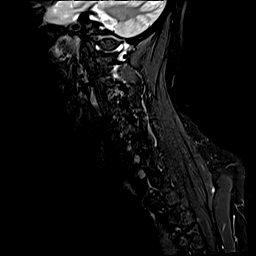
[im 4/15]
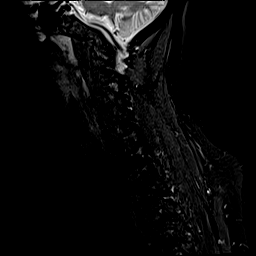
[im 8/15]
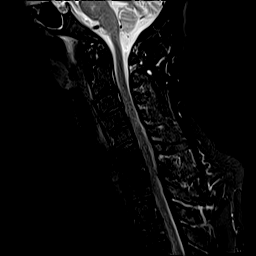
[im 11/15]
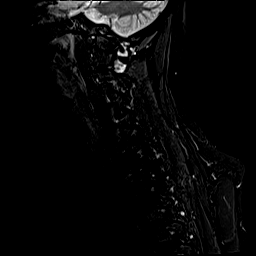
[im 15/15]
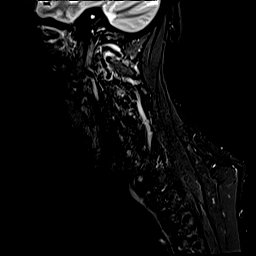

[Series 8: T2 · axial · 3.0mm · 0.66mm/px · z∈[-161,-42]mm · 8 of 40 slices shown (2 of 2)]
[im 1/40]
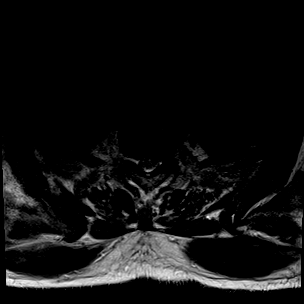
[im 7/40]
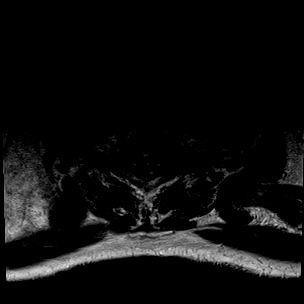
[im 13/40]
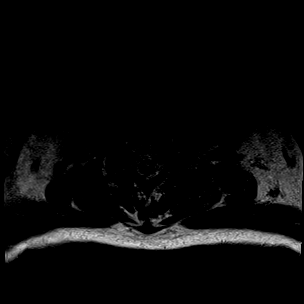
[im 19/40]
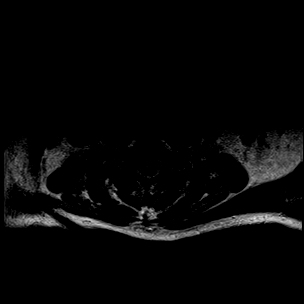
[im 22/40]
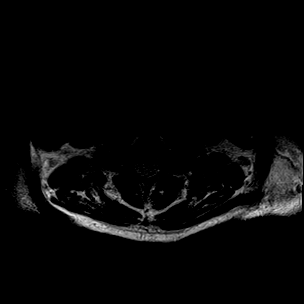
[im 28/40]
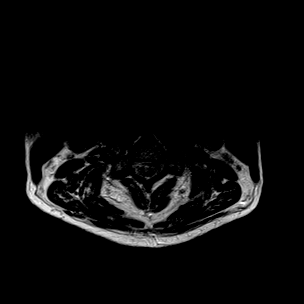
[im 34/40]
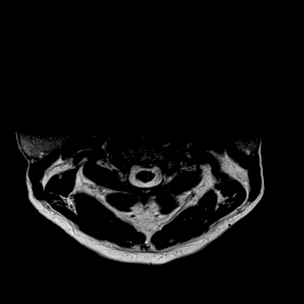
[im 40/40]
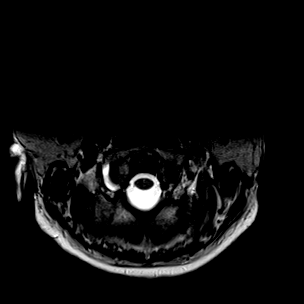

[Series 9: GRE · axial · 3.0mm · 0.39mm/px · z∈[-161,-60]mm · 7 of 40 slices shown]
[im 1/40]
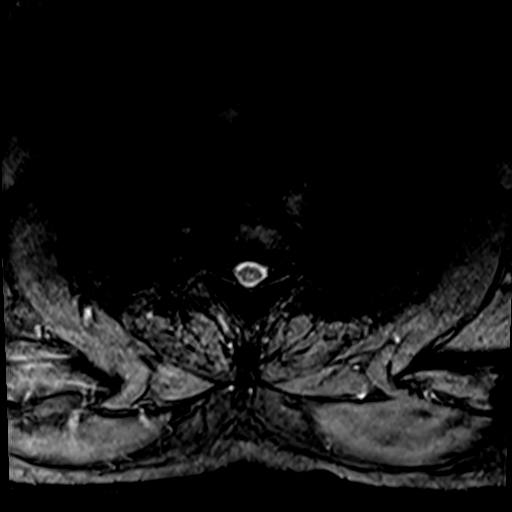
[im 7/40]
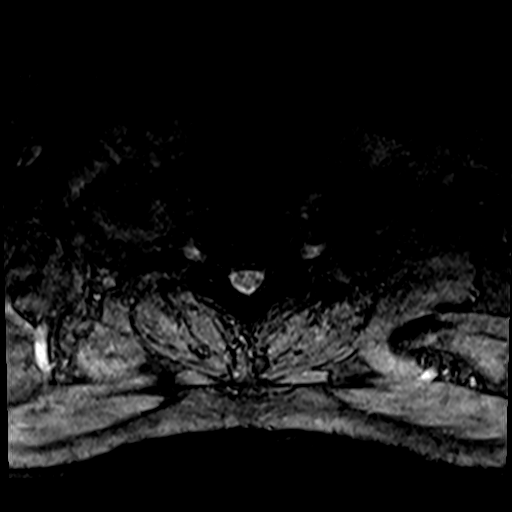
[im 13/40]
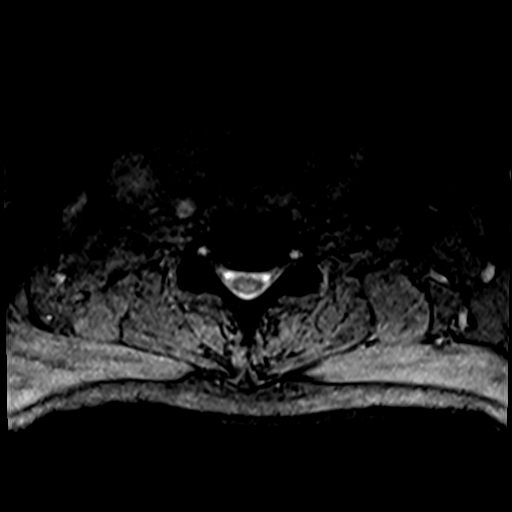
[im 19/40]
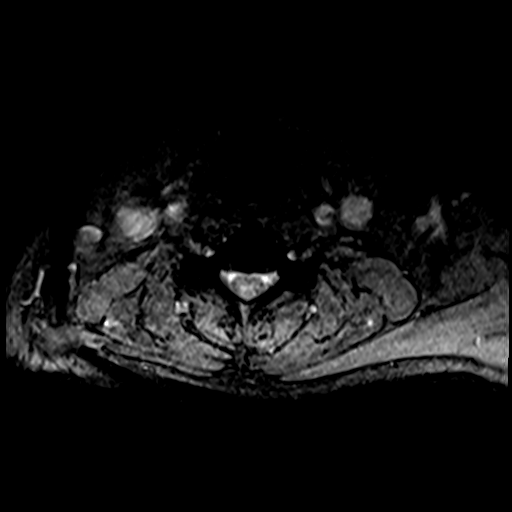
[im 22/40]
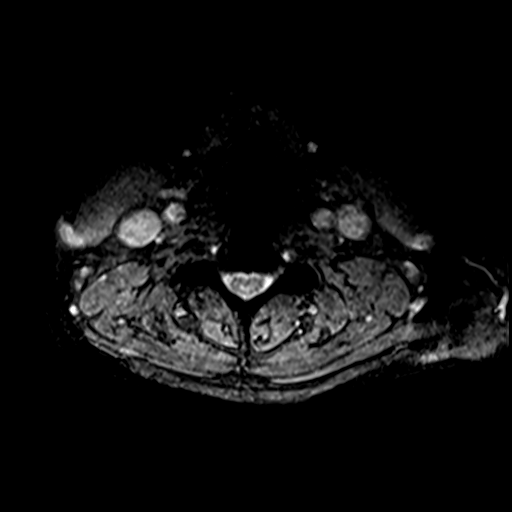
[im 28/40]
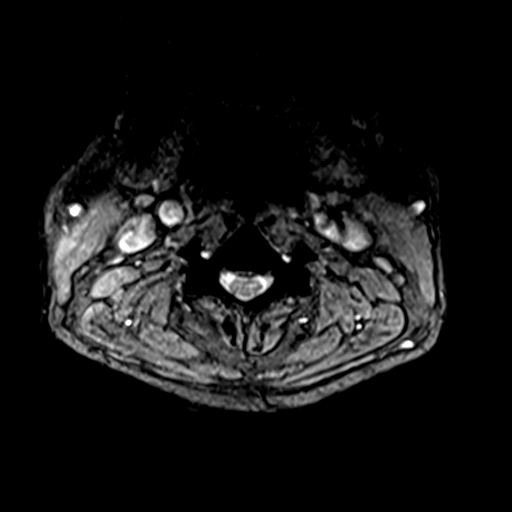
[im 34/40]
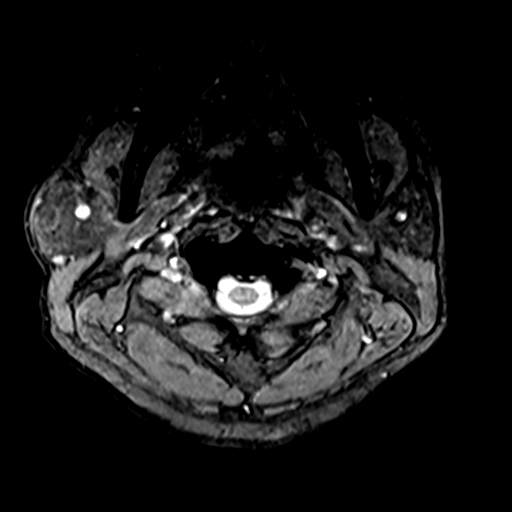

[Series 10: T1 · sagittal · 3.0mm · 0.69mm/px · 5 of 15 slices shown (2 of 2)]
[im 1/15]
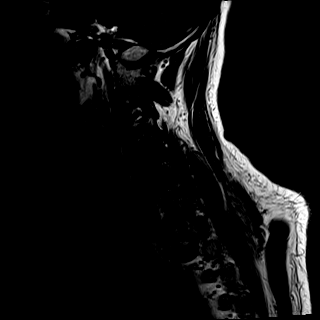
[im 4/15]
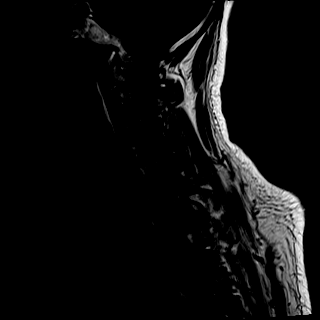
[im 8/15]
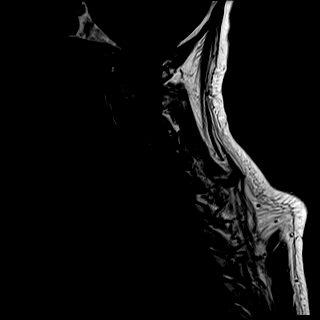
[im 11/15]
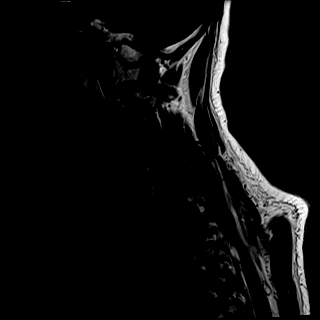
[im 15/15]
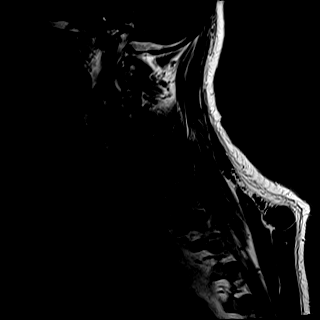

[35 of 48 positions shown; findings below may reference images not displayed]

FINDINGS: Evaluation is somewhat limited by motion artifact.

Alignment: Straightening and mild reversal of the normal cervical
lordosis. No significant listhesis.

Vertebrae: No acute fracture or suspicious osseous lesion.
Degenerative fusion of the anterior aspects of C6 and C7, which is
better seen on the prior CT. Congenitally short pedicles, which
narrow the AP diameter of the spinal canal.

Cord: Normal signal and morphology.

Posterior Fossa, vertebral arteries, paraspinal tissues: Negative.

Disc levels:

C2-C3: No significant disc bulge. Facet and uncovertebral
hypertrophy. No spinal canal stenosis. Mild left neural foraminal
narrowing.

C3-C4: Broad-based disc bulge. Uncovertebral and facet arthropathy.
Mild-to-moderate spinal canal stenosis. Mild-to-moderate left neural
foraminal narrowing.

C4-C5: Mild disc bulge. Facet and uncovertebral hypertrophy. Mild
spinal canal stenosis. Mild left and moderate right neural foraminal
narrowing.

C5-C6: Mild disc bulge. Uncovertebral and facet arthropathy. No
spinal canal stenosis. Mild left neural foraminal narrowing.

C6-C7: No significant disc bulge. Facet and uncovertebral
hypertrophy. Ligamentum flavum hypertrophy. Mild spinal canal
stenosis. Moderate left-greater-than-right neural foraminal
narrowing.

C7-T1: Mild disc bulge. Ligamentum flavum hypertrophy. Facet and
uncovertebral hypertrophy. Mild spinal canal stenosis. Severe
left-greater-than-right neural foraminal narrowing.
IMPRESSION: 1. Evaluation is somewhat limited by motion artifact. Within this
limitation, congenitally short pedicles and superimposed
degenerative changes, which cause mild-to-moderate spinal canal
stenosis at C3-C4 and mild spinal canal stenosis at C4-C5, C6-C7,
and C7-T1. No definite cord signal abnormality.
2. Multilevel uncovertebral and facet arthropathy, which overall
left-greater-than-right and is worst at C7-T1, where it is severe.
There is at least mild left neural foraminal narrowing at every
level in the cervical spine, described in detail above.

## 2022-02-04 MED ORDER — AMLODIPINE BESYLATE 10 MG PO TABS
10.0000 mg | ORAL_TABLET | Freq: Every day | ORAL | Status: DC
  Administered 2022-02-04 – 2022-02-07 (×4): 10 mg via ORAL
  Filled 2022-02-04 (×3): qty 1
  Filled 2022-02-04: qty 2

## 2022-02-04 MED ORDER — DICLOFENAC SODIUM 1 % EX GEL
2.0000 g | Freq: Four times a day (QID) | CUTANEOUS | Status: DC | PRN
  Administered 2022-02-04: 2 g via TOPICAL
  Filled 2022-02-04: qty 100

## 2022-02-04 MED ORDER — IOHEXOL 350 MG/ML SOLN
75.0000 mL | Freq: Once | INTRAVENOUS | Status: AC | PRN
  Administered 2022-02-04: 75 mL via INTRAVENOUS

## 2022-02-04 MED ORDER — METOPROLOL SUCCINATE ER 50 MG PO TB24
50.0000 mg | ORAL_TABLET | Freq: Every day | ORAL | Status: DC
  Administered 2022-02-04 – 2022-02-07 (×4): 50 mg via ORAL
  Filled 2022-02-04 (×2): qty 1
  Filled 2022-02-04: qty 2
  Filled 2022-02-04: qty 1

## 2022-02-04 MED ORDER — ACETAMINOPHEN 500 MG PO TABS: 1000.0000 mg | ORAL_TABLET | Freq: Once | ORAL | Status: DC

## 2022-02-04 MED ORDER — ACETAMINOPHEN 650 MG RE SUPP: 650.0000 mg | Freq: Four times a day (QID) | RECTAL | Status: DC | PRN

## 2022-02-04 MED ORDER — ROSUVASTATIN CALCIUM 20 MG PO TABS
40.0000 mg | ORAL_TABLET | Freq: Every day | ORAL | Status: DC
Start: 2022-02-04 — End: 2022-02-07
  Administered 2022-02-04 – 2022-02-07 (×4): 40 mg via ORAL
  Filled 2022-02-04 (×4): qty 2

## 2022-02-04 MED ORDER — ACETAMINOPHEN 325 MG PO TABS
650.0000 mg | ORAL_TABLET | Freq: Four times a day (QID) | ORAL | Status: DC | PRN
  Administered 2022-02-05: 650 mg via ORAL
  Filled 2022-02-04: qty 2

## 2022-02-04 MED ORDER — HYDROCODONE-ACETAMINOPHEN 5-325 MG PO TABS
1.0000 | ORAL_TABLET | Freq: Four times a day (QID) | ORAL | Status: DC | PRN
  Administered 2022-02-04 – 2022-02-06 (×7): 1 via ORAL
  Filled 2022-02-04 (×7): qty 1

## 2022-02-04 MED ORDER — FLUTICASONE FUROATE-VILANTEROL 200-25 MCG/ACT IN AEPB
1.0000 | INHALATION_SPRAY | Freq: Every day | RESPIRATORY_TRACT | Status: DC
  Administered 2022-02-06: 1 via RESPIRATORY_TRACT
  Filled 2022-02-04: qty 28

## 2022-02-04 MED ORDER — ALBUTEROL SULFATE (2.5 MG/3ML) 0.083% IN NEBU
2.5000 mg | INHALATION_SOLUTION | Freq: Four times a day (QID) | RESPIRATORY_TRACT | Status: DC | PRN
  Administered 2022-02-05: 2.5 mg via RESPIRATORY_TRACT
  Filled 2022-02-04: qty 3

## 2022-02-04 MED ORDER — SENNOSIDES-DOCUSATE SODIUM 8.6-50 MG PO TABS: 1.0000 | ORAL_TABLET | Freq: Every evening | ORAL | Status: DC | PRN

## 2022-02-04 MED ORDER — FENTANYL CITRATE PF 50 MCG/ML IJ SOSY
50.0000 ug | PREFILLED_SYRINGE | Freq: Once | INTRAMUSCULAR | Status: AC
  Administered 2022-02-04: 50 ug via INTRAVENOUS
  Filled 2022-02-04: qty 1

## 2022-02-04 MED ORDER — ALLOPURINOL 100 MG PO TABS
300.0000 mg | ORAL_TABLET | Freq: Every day | ORAL | Status: DC
  Administered 2022-02-04 – 2022-02-07 (×4): 300 mg via ORAL
  Filled 2022-02-04 (×4): qty 3

## 2022-02-04 NOTE — ED Notes (Signed)
Patient transported to CT 

## 2022-02-04 NOTE — ED Provider Notes (Signed)
[facility] EMERGENCY DEPARTMENT Provider Note   CSN: [id number] Arrival date & time: 02/04/22  0945     History  Chief Complaint  Patient presents with   Weakness   Aphasia    Darryl Hurley is a 61 y.o. male.   Weakness Patient presents with left-sided pain and weakness.  Has had for around a week now.  Also states that feels numb at times.  Pain in his shoulder and knee.  States he has had chronic pain since he had surgery for gout on the legs.  States they operate on both sides and they are only supposed to be operating on 1.  Also chronic pain in left shoulder.  States more difficulty on the left side and now cannot walk.  Difficulty moving the arm.  Also some slurred speech   Past Medical History:  Diagnosis Date   Alcohol abuse    Anemia    Angina pectoris    Arthritis    Asthma    Chronic lower back pain    COPD (chronic obstructive pulmonary disease) ([place])    on home O2 2L   Diabetes mellitus without complication ([place])    DVT (deep venous thrombosis) ([facility])    bilateral   Exertional shortness of breath    Gout    Hypertension    Seizures ([place])     Home Medications Prior to Admission medications   Medication Sig Start Date End Date Taking? Authorizing Provider  acetaminophen (TYLENOL) 325 MG tablet Take 2 tablets (650 mg total) by mouth every 6 (six) hours as needed. Patient taking differently: Take 325 mg by mouth every 6 (six) hours as needed for moderate pain. 05/20/19   Lamptey, Myrene Galas, MD  albuterol (PROVENTIL HFA;VENTOLIN HFA) 108 (90 BASE) MCG/ACT inhaler Inhale 1 puff into the lungs every 6 (six) hours as needed for wheezing or shortness of breath.    [provider]  allopurinol (ZYLOPRIM) 300 MG tablet Take 300 mg by mouth daily. 02/02/21   [provider]  amLODipine (NORVASC) 10 MG tablet Take 10 mg by mouth daily. 08/06/18   [provider]  aspirin EC 81 MG tablet Take 1 tablet (81 mg total) by mouth  daily. Swallow whole. 04/07/21   Patwardhan, Reynold Bowen, MD  cloNIDine (CATAPRES) 0.1 MG tablet Take 0.1 mg by mouth 2 (two) times daily. 08/15/18   [provider]  colchicine 0.6 MG tablet Take 0.6 mg by mouth daily as needed. Take 1 tablet (0.6 mg) in 1 hr as needed for gout 08/15/20   [provider]  fluticasone (FLONASE) 50 MCG/ACT nasal spray Place into both nostrils. 02/21/21   [provider]  gabapentin (NEURONTIN) 600 MG tablet Take 600 mg by mouth 3 (three) times daily. 09/12/20   [provider]  HYDROcodone-acetaminophen (NORCO/VICODIN) 5-325 MG tablet Take 1 tablet by mouth every 6 (six) hours as needed for moderate pain. Patient not taking: Reported on 05/15/2021    [provider]  lidocaine (LIDODERM) 5 % Place 1 patch onto the skin daily. 02/21/21   [provider]  losartan (COZAAR) 100 MG tablet Take 100 mg by mouth daily. 08/13/20   [provider]  metoprolol succinate (TOPROL-XL) 50 MG 24 hr tablet Take 1 tablet (50 mg total) by mouth daily. Take with or immediately following a meal. 04/06/21 07/05/21  Patwardhan, Reynold Bowen, MD  nitroGLYCERIN (NITROSTAT) 0.4 MG SL tablet Place 1 tablet (0.4 mg total) under the tongue every  5 (five) minutes x 3 doses as needed for chest pain. 02/11/21   Joy, Shawn C, PA-C  oxyCODONE (OXY IR/ROXICODONE) 5 MG immediate release tablet Take 5 mg by mouth every 6 (six) hours as needed for severe pain.    [provider]  OXYGEN Inhale into the lungs. 2 liters Patient not taking: Reported on 05/15/2021    [provider]  rosuvastatin (CRESTOR) 40 MG tablet Take 40 mg by mouth daily.    [provider]  SYMBICORT 160-4.5 MCG/ACT inhaler Inhale 2 puffs into the lungs 2 (two) times daily. 11/05/19   [provider]  labetalol (NORMODYNE) 200 MG tablet Take 1 tablet (200 mg total) by mouth 2 (two) times daily. Patient not taking: Reported on 03/25/2019 02/16/19 05/20/19   Nigel Mormon, MD      Allergies    Nsaids and Toradol [ketorolac tromethamine]    Review of Systems   Review of Systems  Neurological:  Positive for weakness.   Physical Exam Updated Vital Signs BP (!) 188/115    Pulse 96    Temp 98.5 F (36.9 C)    Resp (!) 29    SpO2 94%  Physical Exam Vitals reviewed.  HENT:     Head:     Comments: May have some mild left-sided facial droop.  Not very compliant with a smile.  Eye movements intact. Eyes:     Pupils: Pupils are equal, round, and reactive to light.  Cardiovascular:     Rate and Rhythm: Regular rhythm.  Chest:     Chest wall: No tenderness.  Musculoskeletal:     Comments: Tenderness to left anterior shoulder.  Decreased range of motion with pain at the shoulder.  Good range of motion in elbow but pain with it.  Sensation grossly intact in hand.  Pulse intact in the wrist.  Effusion on left knee.  Decreased movement of both lower extremities particularly will not move left foot at all.  Skin:    Capillary Refill: Capillary refill takes less than 2 seconds.  Neurological:     Mental Status: He is alert and oriented to person, place, and time.     Comments: Decreased movement of left shoulder.  Decreased movement of both lower extremities particular the left lower extremity.  May have left facial droop and states decreased sensation on left side of face.  Extremities grossly intact in extremities.    ED Results / Procedures / Treatments   Labs (all labs ordered are listed, but only abnormal results are displayed) Labs Reviewed  CBC WITH DIFFERENTIAL/PLATELET - Abnormal; Notable for the following components:      Result Value   RDW 15.6 (*)    All other components within normal limits  COMPREHENSIVE METABOLIC PANEL - Abnormal; Notable for the following components:   Glucose, Bld 143 (*)    Creatinine, Ser 1.26 (*)    Albumin 3.3 (*)    AST 44 (*)    All other components within normal limits  CBG MONITORING, ED -  Abnormal; Notable for the following components:   Glucose-Capillary 144 (*)    All other components within normal limits    EKG EKG Interpretation  Date/Time:  Sunday February 04 2022 09:53:13 EST Ventricular Rate:  98 PR Interval:  140 QRS Duration: 85 QT Interval:  333 QTC Calculation: 426 R Axis:   50 Text Interpretation: Sinus rhythm Minimal ST depression, inferior leads Confirmed by Davonna Belling [phone]) on 02/04/2022 10:13:39 AM  Radiology CT  ANGIO HEAD NECK W WO CM  Result Date: 02/04/2022 CLINICAL DATA:  Provided history: Neuro deficit, acute, stroke suspected. Additional history provided: Patient reports 6 days of left-sided weakness and slurred speech. EXAM: CT ANGIOGRAPHY HEAD AND NECK TECHNIQUE: Multidetector CT imaging of the head and neck was performed using the standard protocol during bolus administration of intravenous contrast. Multiplanar CT image reconstructions and MIPs were obtained to evaluate the vascular anatomy. Carotid stenosis measurements (when applicable) are obtained utilizing NASCET criteria, using the distal internal carotid diameter as the denominator. RADIATION DOSE REDUCTION: This exam was performed according to the departmental dose-optimization program which includes automated exposure control, adjustment of the mA and/or kV according to patient size and/or use of iterative reconstruction technique. CONTRAST:  10mL OMNIPAQUE IOHEXOL 350 MG/ML SOLN COMPARISON:  Prior head CT examinations 12/25/2018 and earlier. FINDINGS: CT HEAD FINDINGS Brain: Mild generalized parenchymal atrophy. Mild patchy and ill-defined hypoattenuation within the cerebral white matter, nonspecific but compatible with chronic small vessel ischemic disease. There is no acute intracranial hemorrhage. No demarcated cortical infarct. No extra-axial fluid collection. No evidence of an intracranial mass. No midline shift. Vascular: No hyperdense vessel.  Atherosclerotic calcifications. Skull:  Normal. Negative for fracture or focal lesion. Sinuses: Moderate mucosal thickening within the left maxillary sinus. Small mucous retention cysts within the right maxillary sinus. Mild mucosal thickening within the bilateral frontal sinuses. Orbits: No orbital mass or acute orbital finding. Bilateral proptosis. Chronic medially displaced fracture deformity of the left lamina papyracea. Review of the MIP images confirms the above findings CTA NECK FINDINGS Aortic arch: Standard aortic branching. Atherosclerotic plaque within the visualized aortic arch and proximal major branch vessels of the neck. Streak and beam hardening artifact arising from a dense left-sided contrast bolus partially obscures the left subclavian artery. Within this limitation, there is no appreciable innominate or proximal subclavian artery stenosis. Right carotid system: CCA and ICA patent within the neck without significant stenosis (50% or greater). Moderate soft and calcified plaque about the carotid bifurcation and within the proximal ICA. Left carotid system: CCA and ICA patent within the neck without significant stenosis (50% or greater). Mild soft and calcified plaque within the CCA, about the carotid bifurcation and within the proximal ICA. Vertebral arteries: Vertebral arteries codominant and patent within the neck without hemodynamically significant stenosis. Mild calcified plaque within the proximal V2 left vertebral artery. Skeleton: Cervical spondylosis. Fusion across the C6-C7 disc space. Reversal of the expected cervical lordosis. No acute bony abnormality or aggressive osseous lesion. Other neck: No neck mass or cervical lymphadenopathy. Upper chest: No consolidation within the imaged lung apices. Review of the MIP images confirms the above findings CTA HEAD FINDINGS Anterior circulation: The intracranial internal carotid arteries are patent. Moderate stenosis at the right petrous-cavernous junction. Apparent moderate-to-severe  stenosis within the cavernous left ICA (series 15, image 115). The M1 middle cerebral arteries are patent. No M2 proximal branch occlusion or high-grade proximal stenosis is identified. The anterior cerebral arteries are patent. Severe stenosis within the left ACA at the A2/A3 junction. No intracranial aneurysm is identified. 1-2 mm in thickness-vascular protrusion arising from the paraclinoid left ICA, which may reflect an infundibulum or small aneurysm (series 15, image 113). Posterior circulation: The intracranial vertebral arteries are patent. Plaque within the right vertebral artery at the level of the skull base resulting in at least moderate stenosis. The basilar artery is patent. The posterior cerebral arteries are patent. Fetal origin right PCA. Severe stenosis within a right PCA branch at the  P2/P3 junction. The left posterior communicating artery is small or absent. Venous sinuses: Within the limitations of contrast timing, no convincing thrombus. Anatomic variants: As described. Review of the MIP images confirms the above findings IMPRESSION: CT head: 1. No evidence of acute intracranial abnormality. 2. Mild chronic small vessel ischemic changes within the cerebral white matter. 3. Mild generalized parenchymal atrophy. 4. Bilateral proptosis. 5. Paranasal sinus disease, as described. CTA neck: 1. The common carotid, internal carotid and vertebral arteries are patent within the neck without hemodynamically significant stenosis. Atherosclerotic plaque within the common carotid, internal carotid and left vertebral arteries as described. 2. Cervical spondylosis. Fusion across the C6-C7 disc space anteriorly. 3. Nonspecific reversal of the expected cervical lordosis. CTA head: 1. No intracranial large vessel occlusion is identified. 2. Intracranial atherosclerotic disease with multifocal stenoses, most notably as follows. 3. Moderate stenosis within the right ICA at the petrous-cavernous junction. 4. Apparent  moderate to severe stenosis within the cavernous left ICA. 5. Severe stenosis within the left anterior cerebral artery at the A2/A3 junction. 6. Calcified plaque within the right vertebral artery at the level of the skull base resulting in at least moderate stenosis. 7. Severe stenosis within a right PCA branch at the P2/P3 junction. 8. 1-2 mm inferiorly projecting vascular protrusion arising from the paraclinoid left ICA, which may reflect an infundibulum or small aneurysm. Electronically Signed   By: Kellie Simmering D.O.   On: 02/04/2022 11:57   DG Chest 2 View  Result Date: 02/04/2022 CLINICAL DATA:  Left-sided weakness, slurred speech for 6 days EXAM: CHEST - 2 VIEW COMPARISON:  09/09/2021 FINDINGS: The heart size and mediastinal contours are within normal limits. Both lungs are clear. The visualized skeletal structures are unremarkable. IMPRESSION: No active cardiopulmonary disease. Electronically Signed   By: Kathreen Devoid M.D.   On: 02/04/2022 10:56   DG Shoulder Left  Result Date: 02/04/2022 CLINICAL DATA:  Left shoulder pain. EXAM: LEFT SHOULDER - 2+ VIEW COMPARISON:  None. FINDINGS: No acute fracture or dislocation. No aggressive osseous lesion. Normal alignment. Generalized osteopenia. Mild arthropathy of the acromioclavicular joint. Glenohumeral joint space is maintained. Soft tissue are unremarkable. No radiopaque foreign body or soft tissue emphysema. IMPRESSION: 1. No acute osseous injury of the left shoulder. Electronically Signed   By: Kathreen Devoid M.D.   On: 02/04/2022 10:54   DG Knee Complete 4 Views Left  Result Date: 02/04/2022 CLINICAL DATA:  Left knee pain EXAM: LEFT KNEE - COMPLETE 4+ VIEW COMPARISON:  11/19/2019 FINDINGS: No acute fracture or dislocation. No aggressive osseous lesion. Normal alignment. Generalized osteopenia. Moderate medial femorotibial compartment osteoarthritis. Mild lateral femorotibial compartment osteoarthritis. Mild patellofemoral compartment osteoarthritis.  Large joint effusion. Soft tissue are unremarkable. No radiopaque foreign body or soft tissue emphysema. Peripheral vascular atherosclerotic disease. IMPRESSION: 1. Tricompartmental osteoarthritis of the left knee. Large joint effusion. 2.  No acute osseous injury of the left knee. Electronically Signed   By: Kathreen Devoid M.D.   On: 02/04/2022 10:55    Procedures Procedures    Medications Ordered in ED Medications  iohexol (OMNIPAQUE) 350 MG/ML injection 75 mL (75 mLs Intravenous Contrast Given 02/04/22 1136)    ED Course/ Medical Decision Making/ A&P                           Medical Decision Making Amount and/or Complexity of Data Reviewed Labs: ordered. Radiology: ordered.  Risk Prescription drug management.  Initial differential diagnosis for weakness and  decreased movement is long and does include pathology such as stroke Patient presents with a week of symptoms.  Pain in left shoulder and left knee but decreased movement on left side.  Has some chronic weakness but appears that this is worse.  Has tenderness on left shoulder and effusion on the left knee however.  Has a history of gout potentially could be doing the pain, however really not moving left foot.  May have some left facial droop and speech is a little slurred.  States decreased sensation on left face.  Knee x-ray reviewed and did show effusion.  Independently interpreted.  CTA done and showed some vascular disease.  No acute hemorrhage.  Potentially some of the difficulty of using the left side could be due to pain from shoulder or knee pathology but not moving foot and with some potential facial involvement I feel if he would need admission to the hospital for further evaluation.  Will discuss with unassigned medicine which appears to be the internal medicine residents at this point.  We will also inform neurology. Not a tPA candidate due to time of onset of about a week ago.  Has had effusions in the knee with gout in the  past but also has had septic joints.  Will get sample of fluid.        Final Clinical Impression(s) / ED Diagnoses Final diagnoses:  Weakness  Left knee pain, unspecified chronicity  Left shoulder pain, unspecified chronicity    Rx / DC Orders ED Discharge Orders     None         Davonna Belling, MD 02/04/22 1219

## 2022-02-04 NOTE — Consult Note (Signed)
NEUROLOGY CONSULTATION NOTE   Date of service: February 04, 2022 Patient Name: Darryl Hurley MRN:  [id number] DOB:  [birth year] Reason for consult: "L sided weakness and pain" Requesting Provider: Sid Falcon, MD _ _ _   _ __   _ __ _ _  __ __   _ __   __ _  History of Present Illness  Darryl Hurley is a 61 y.o. male with PMH significant for alcohol abuse, anemia, angina pectoris, arthritis, asthma, chronic lower back pain, COPD, previously on 2L [place] at home, DM, HTN, previous hx of seizures and bilateral knee surgeries. He presented to [facility] ED with worsened left sided pain&weakness, numbness, slightly slurred speech and ?facial droop.  He has pain at baseline in his back and BL Knees. Reports that his LUE has been weak for a about 7 days now and also endorses pain in his arm and in his L leg. 3 days ago, the pain and the weakness worsened. He also felt like his speech was stuttering. He also reports that his L face is numb but no facial droop.  No headache, no prior hx of similar symptoms. He endorses drinking alcohol for several decades. He smokes. He has a hx of DM2, HTN, HLD.  Endorses Lhermitt's sign, can feel when his bladder is full but cant hold it in for a reasonable amount of time. Can completely empty his bladder when he goes to urinate. Denies any constipation.  His reported seizure history is somewhat unclear. He is not on any AEDs. He has never seen a neurologist for seizures. He has not had one in over 2 years. Notes from ED visit in 2020 mention seizure in the setting of fall after alcohol intake. There is also mention of him being waken up by EMS from a seizure in 2014.    ROS   Constitutional Denies weight loss, fever and chills.   HEENT Denies changes in vision and hearing.   Respiratory Denies SOB and cough.   CV Denies palpitations and CP   GI Denies abdominal pain, nausea, vomiting and diarrhea.   GU Denies dysuria but endorses urinary frequency.  MSK  Endorses significant myalgia and joint pain.  Skin Denies rash and pruritus.  Neurological Denies headache and syncope.  Psychiatric Denies recent changes in mood. Denies anxiety and depression.   Past History   Past Medical History:  Diagnosis Date   Alcohol abuse    Anemia    Angina pectoris    Arthritis    Asthma    Chronic lower back pain    COPD (chronic obstructive pulmonary disease) ([place])    on home O2 2L   Diabetes mellitus without complication ([place])    DVT (deep venous thrombosis) (HCC)    bilateral   Exertional shortness of breath    Gout    Hypertension    Seizures ([place])    Past Surgical History:  Procedure Laterality Date   INCISION AND DRAINAGE ABSCESS Bilateral 11/19/2019   Procedure: INCISION AND DRAINAGE BILATERAL SEPTIC KNEES, ASPIRATION OF LEFT KNEE;  Surgeon: Shona Needles, MD;  Location: [place];  Service: Orthopedics;  Laterality: Bilateral;   INGUINAL HERNIA REPAIR Bilateral 09/01/2020   Procedure: BILATERAL LAPAROSCOPIC INGUINAL HERNIA REPAIR WITH MESH;  Surgeon: Coralie Keens, MD;  Location: [place];  Service: General;  Laterality: Bilateral;   INSERTION OF MESH Bilateral 09/01/2020   Procedure: INSERTION OF MESH;  Surgeon: Coralie Keens, MD;  Location: [place];  Service: General;  Laterality: Bilateral;   NO PAST SURGERIES     Family History  Problem Relation Age of Onset   Diabetes type II Mother    Depression Father    Suicidality Father    Asthma Brother    Social History   Socioeconomic History   Marital status: Single    Spouse name: Not on file   Number of children: 2   Years of education: 11th   Highest education level: Not on file  Occupational History    Employer: OTHER    Comment: disability  Tobacco Use   Smoking status: Not on file   Smokeless tobacco: Never   Tobacco comments:    05/12/2013 'eased off smoking since last month"  Vaping Use   Vaping Use: Never used  Substance and Sexual Activity   Alcohol use: Yes     Comment: 2 pts of beer a night, 2 pts of liquor a week   Drug use: No    Types: "Crack" cocaine   Sexual activity: Never  Other Topics Concern   Not on file  Social History Narrative   ** Merged History Encounter **       Patient lives at home with friend.   Caffeine Use: none   Social Determinants of Radio broadcast assistant Strain: Not on file  Food Insecurity: Not on file  Transportation Needs: Not on file  Physical Activity: Not on file  Stress: Not on file  Social Connections: Not on file   Allergies  Allergen Reactions   Nsaids Other (See Comments)    Vagale down reaction   Toradol [Ketorolac Tromethamine]     vagale down reaction    Medications   Medications Prior to Admission  Medication Sig Dispense Refill Last Dose   acetaminophen (TYLENOL) 325 MG tablet Take 2 tablets (650 mg total) by mouth every 6 (six) hours as needed. (Patient taking differently: Take 325 mg by mouth every 6 (six) hours as needed for moderate pain.)      albuterol (PROVENTIL HFA;VENTOLIN HFA) 108 (90 BASE) MCG/ACT inhaler Inhale 1 puff into the lungs every 6 (six) hours as needed for wheezing or shortness of breath.      allopurinol (ZYLOPRIM) 300 MG tablet Take 300 mg by mouth daily.      amLODipine (NORVASC) 10 MG tablet Take 10 mg by mouth daily.  2    aspirin EC 81 MG tablet Take 1 tablet (81 mg total) by mouth daily. Swallow whole. 90 tablet 3    cloNIDine (CATAPRES) 0.1 MG tablet Take 0.1 mg by mouth 2 (two) times daily.  3    colchicine 0.6 MG tablet Take 0.6 mg by mouth daily as needed. Take 1 tablet (0.6 mg) in 1 hr as needed for gout      fluticasone (FLONASE) 50 MCG/ACT nasal spray Place into both nostrils.      gabapentin (NEURONTIN) 600 MG tablet Take 600 mg by mouth 3 (three) times daily.      HYDROcodone-acetaminophen (NORCO/VICODIN) 5-325 MG tablet Take 1 tablet by mouth every 6 (six) hours as needed for moderate pain. (Patient not taking: Reported on 05/15/2021)       lidocaine (LIDODERM) 5 % Place 1 patch onto the skin daily.      losartan (COZAAR) 100 MG tablet Take 100 mg by mouth daily.      metoprolol succinate (TOPROL-XL) 50 MG 24 hr tablet Take 1 tablet (50 mg total) by mouth daily. Take with or immediately following a  meal. 90 tablet 3    nitroGLYCERIN (NITROSTAT) 0.4 MG SL tablet Place 1 tablet (0.4 mg total) under the tongue every 5 (five) minutes x 3 doses as needed for chest pain. 30 tablet 3    oxyCODONE (OXY IR/ROXICODONE) 5 MG immediate release tablet Take 5 mg by mouth every 6 (six) hours as needed for severe pain.      OXYGEN Inhale into the lungs. 2 liters (Patient not taking: Reported on 05/15/2021)      rosuvastatin (CRESTOR) 40 MG tablet Take 40 mg by mouth daily.      SYMBICORT 160-4.5 MCG/ACT inhaler Inhale 2 puffs into the lungs 2 (two) times daily.        Vitals   Vitals:   02/04/22 1430 02/04/22 1445 02/04/22 1711 02/04/22 1730  BP: (!) 178/107 (!) 179/106 (!) 180/115 114/87  Pulse: (!) 106 (!) 106 96   Resp: (!) 35 (!) 25 (!) 24   Temp:  98.7 F (37.1 C) 99.8 F (37.7 C)   TempSrc:  Oral Oral   SpO2: 94% 94% 95%      There is no height or weight on file to calculate BMI.  Physical Exam   General: Laying comfortably in bed; in no acute distress.  HENT: Normal oropharynx and mucosa. Normal external appearance of ears and nose.  Neck: Supple, no pain or tenderness  CV: No JVD. No peripheral edema.  Pulmonary: Symmetric Chest rise. Normal respiratory effort.  Abdomen: Soft to touch, non-tender.  Ext: No cyanosis, edema, or deformity  Skin: No rash. Normal palpation of skin.   Musculoskeletal: Normal digits and nails by inspection. No clubbing.   Neurologic Examination  Mental status/Cognition: Alert, oriented to self, place, month and year, good attention.  Speech/language: Fluent, comprehension intact, object naming intact, repetition intact.  Cranial nerves:   CN II Pupils equal and reactive to light, no VF  deficits    CN III,IV,VI EOM intact, no gaze preference or deviation, no nystagmus    CN V normal sensation in V1, V2, and V3 segments bilaterally    CN VII no asymmetry, no nasolabial fold flattening    CN VIII normal hearing to speech    CN IX & X normal palatal elevation, no uvular deviation    CN XI 5/5 head turn and 5/5 shoulder shrug bilaterally    CN XII midline tongue protrusion    Motor:  Muscle bulk: poor, tone normal. Mvmt Root Nerve  Muscle Right Left Comments  SA C5/6 Ax Deltoid 5 2   EF C5/6 Mc Biceps 5 2   EE C6/7/8 Rad Triceps 5 2   WF C6/7 Med FCR     WE C7/8 PIN ECU     F Ab C8/T1 U ADM/FDI 5 3   HF L1/2/3 Fem Illopsoas 4 2   KE L2/3/4 Fem Quad 5 2   DF L4/5 D Peron Tib Ant 5 2   PF S1/2 Tibial Grc/Sol 5 2    Reflexes:  Right Left Comments  Pectoralis      Biceps (C5/6) 2 2   Brachioradialis (C5/6) 2 2    Triceps (C6/7) 2 2    Patellar (L3/4) 2 - Patient declined 2/2 L knee swelling and patient   Achilles (S1) 1 1    Hoffman      Plantar     Jaw jerk    Sensation:  Light touch Decreased in L face, L arm but R leg.   Pin prick  Temperature    Vibration   Proprioception    Coordination/Complex Motor:  - Finger to Nose intact in RUE, unable to do with LUE - Heel to shin unable to do - Rapid alternating movement are slowed throughout - Gait: deferred for patient safety.  Labs   CBC:  Recent Labs  Lab 02/04/22 1015  WBC 10.1  NEUTROABS 7.7  HGB 13.4  HCT 40.1  MCV 91.3  PLT 0000000    Basic Metabolic Panel:  Lab Results  Component Value Date   NA 138 02/04/2022   K 3.6 02/04/2022   CO2 25 02/04/2022   GLUCOSE 143 (H) 02/04/2022   BUN 16 02/04/2022   CREATININE 1.26 (H) 02/04/2022   CALCIUM 9.3 02/04/2022   GFRNONAA >60 02/04/2022   GFRAA >60 09/01/2020   Lipid Panel:  Lab Results  Component Value Date   LDLCALC 113 (H) 03/18/2019   HgbA1c:  Lab Results  Component Value Date   HGBA1C 6.9 (H) 11/20/2019   Urine Drug Screen:      Component Value Date/Time   LABOPIA NONE DETECTED 09/09/2021 2256   COCAINSCRNUR NONE DETECTED 09/09/2021 2256   LABBENZ NONE DETECTED 09/09/2021 2256   AMPHETMU NONE DETECTED 09/09/2021 2256   THCU NONE DETECTED 09/09/2021 2256   LABBARB NONE DETECTED 09/09/2021 2256    Alcohol Level     Component Value Date/Time   ETH 280 (H) 09/09/2021 2110    CT Head without contrast(Personally reviewed): No acute intracranial abnormality  CT angio Head and Neck with contrast(Personally reviewed): No LVO but notable for multifocal multivessel stenosis.  MRI Brain(Personally reviewed): No stroke  Impression   Darryl Hurley is a 61 y.o. male with PMH significant for alcohol abuse, anemia, angina pectoris, arthritis, asthma, chronic lower back pain, COPD, previously on 2L [place] at home, DM, HTN, previous hx of seizures and bilateral knee surgeries. He presented to [facility] ED with worsened left sided pain&weakness, numbness, slightly slurred speech. Neuro exam significantly limited by pain but no obviosu facial droop, LUE pain limits assessment severely, L knee swelling and pain limits assessment severely. He reports numbness in L face, L arm but in R leg on my evaluation. Reflexes are symmetric.  Difficult to assess if he truly has weakness out of proportion to pain. I would not expect stroke to present with this degree of pain. Spinal canal stenosis and foraminal narrowing can cause pain in addition to weakness and numbness, given that the spinal trigeminal ganglia extends down upto C4, spinal lesion could also explain his facial numbness. Will get both MRI Brain and C spine.  Recommendations  - MRI C spine without contrast. ______________________________________________________________________   Thank you for the opportunity to take part in the care of this patient. If you have any further questions, please contact the neurology consultation attending.  Signed,  [place] Pager Number [phone] _ _ _   _ __   _ __ _ _  __ __   _ __   __ _

## 2022-02-04 NOTE — H&P (Signed)
Date: 02/04/2022               Patient Name:  Darryl Hurley MRN: [id number]  DOB: Jun 26, 1961 Age / Sex: 61 y.o., male   PCP: Pcp, No         Medical Service: Internal Medicine Teaching Service         Attending Physician: Dr. Sid Falcon, MD    First Contact: Dr. Raymondo Band Pager: [phone]  Second Contact: Dr. Coy Saunas Pager: [phone]       After Hours (After 5p/  First Contact Pager: [phone]  weekends / holidays): Second Contact Pager: [phone]   Chief Complaint: left sided weakness  History of Present Illness: Darryl Hurley is a 61 yo male with COPD previously on 2 L [place], HTN, well controlled type 2 diabetes, previous hx of seizures, and chronic low back pain who presents to the hospital with left sided pain and weakness that started about 1 week ago.  The patient states that he has been having pain in his left shoulder and knee for the last 3 days, although he does also deal with chronic pain. He notes that his left knee has been swollen for about 1 week. He has a history of bilateral knee operations, although he is unsure what surgery he had done. He also has generalized chronic weakness, but states that this feels worse than usual for him. Specifically, he feels that his left arm and left leg are weaker than usual. Over this same time period, the patient has also had numbness in his legs. He has had difficulty walking and also has had difficulty moving his left arm around which he attributes to the amount of pain he is in. In the ED, he was noted to have a left sided facial droop with some slight slurring of his speech. The patient's brother is at the bedside with him and states that he first noticed that the patient's speech was different this morning and he describes that he is stuttering more than usual. The patient is also endorsing a decrease in sensation on the left side of his face and notes that it feels like sandpaper anytime someone touches the left side of his face. He  denies any fevers, chills, dizziness, chest pain, palpitations, SOB, abd pain, n/v/d, dysuria, or hematuria. He does does endorse blurry vision, but has a hx of bilateral cataracts.     Meds: (patient unable to name his meds)  Albuterol Allopurinol 300 mg  Amlodipine 10 mg Aspirin 81 mg Clonidine 0.1 mg bid Colchicine 0.6 mg prn Gabapentin 600 mg tid Norco 5-325 mg q6h prn Losartan 100 mg Metoprolol succinate 50 mg Crestor 40 mg  Symbicort No outpatient medications have been marked as taking for the 02/04/22 encounter [facility] Encounter).     Allergies: Allergies as of 02/04/2022 - Review Complete 02/04/2022  Allergen Reaction Noted   Nsaids Other (See Comments) 08/19/2018   Toradol [ketorolac tromethamine]  08/19/2018   Past Medical History:  Diagnosis Date   Alcohol abuse    Anemia    Angina pectoris    Arthritis    Asthma    Chronic lower back pain    COPD (chronic obstructive pulmonary disease) ([place])    on home O2 2L   Diabetes mellitus without complication ([place])    DVT (deep venous thrombosis) (HCC)    bilateral   Exertional shortness of breath    Gout    Hypertension    Seizures ([place])  Family History: Diabetes (mother), Hypertension in brother  Social History: Lives with a lady friend. Threasa Beards friend helps him with his meds. Usually independent with ADLs and most iADLs. Smokes 1/2 ppd x 5 years. Drinks liquor and beer every day but he is unable to quantify this. Denies any hx of alcohol withdrawal. Last drink yesterday. No other illicit drug use.  Has not seen a PCP in years.   Review of Systems: A complete ROS was negative except as per HPI.   Physical Exam: Blood pressure (!) 181/109, pulse (!) 101, temperature 98.5 F (36.9 C), resp. rate (!) 24, SpO2 94 %. Physical Exam Constitutional:      General: He is not in acute distress.    Appearance: Normal appearance.  Eyes:     Extraocular Movements: Extraocular movements intact.     Pupils: Pupils  are equal, round, and reactive to light.  Cardiovascular:     Rate and Rhythm: Normal rate and regular rhythm.     Pulses: Normal pulses.     Heart sounds: No murmur heard. Pulmonary:     Effort: Pulmonary effort is normal. No respiratory distress.     Breath sounds: Normal breath sounds. No wheezing, rhonchi or rales.  Abdominal:     General: Bowel sounds are normal. There is no distension.     Palpations: Abdomen is soft.     Tenderness: There is no abdominal tenderness.  Musculoskeletal:     Right lower leg: No edema.     Left lower leg: No edema.     Comments: Left knee with palpable joint effusion and significant edema. Significant tenderness to palpation. Bilateral knee scars consistent with surgeries   Skin:    General: Skin is warm and dry.  Neurological:     Mental Status: He is alert. He is disoriented.     Cranial Nerves: Facial asymmetry present.     Motor: Weakness present. No atrophy.     Coordination: Finger-Nose-Finger Test normal.     Comments: Alert and oriented to person and place. Not oriented to time (does not know the month, year, or the President)  Slight left sided facial droop, smile is not equal on both sides  5/5 strength RLE and RUE, 0/5 strength LUE and LLE, although very minimal effort.   Finger to nose test normal on R, unable to lift L arm.  CN XI- unable to shrug shoulder on left; rest of CN intact  Decreased sensation in left face describing that it feels like sandpaper   Psychiatric:        Mood and Affect: Mood normal.        Behavior: Behavior normal.    EKG: personally reviewed my interpretation is normal sinus rhythm  CXR: personally reviewed my interpretation is negative for any acute process  CBC    Component Value Date/Time   WBC 10.1 02/04/2022 1015   RBC 4.39 02/04/2022 1015   HGB 13.4 02/04/2022 1015   HCT 40.1 02/04/2022 1015   PLT 243 02/04/2022 1015   MCV 91.3 02/04/2022 1015   MCH 30.5 02/04/2022 1015   MCHC 33.4  02/04/2022 1015   RDW 15.6 (H) 02/04/2022 1015   LYMPHSABS 1.3 02/04/2022 1015   MONOABS 1.0 02/04/2022 1015   EOSABS 0.0 02/04/2022 1015   BASOSABS 0.0 02/04/2022 1015   BMP Latest Ref Rng & Units 02/04/2022 09/09/2021 09/09/2021  Glucose 70 - 99 mg/dL 143(H) 131(H) 125(H)  BUN 6 - 20 mg/dL 16 25(H) 26(H)  Creatinine 0.61 -  1.24 mg/dL 1.26(H) 1.61(H) 2.10(H)  Sodium 135 - 145 mmol/L 138 136 138  Potassium 3.5 - 5.1 mmol/L 3.6 4.4 4.1  Chloride 98 - 111 mmol/L 102 103 105  CO2 22 - 32 mmol/L 25 23 -  Calcium 8.9 - 10.3 mg/dL 9.3 9.4 -     Assessment & Plan by Problem: Principal Problem:   Stroke (HCC)  #Left sided weakness, rule out cerebral infarction Patient presented with left sided weakness, some mild left sided facial droop, and slight slurring/stuttering of his speech. The weakness has been ongoing for a few days, but the speech changes were first noticed this morning by the patient's brother. CT head in the ED with no acute intracranial abnormality. CTA head with moderate stenosis of R ICA and moderate to severe stenosis of L ICA. Also noted to have severe stenosis of the L ACA and severe stenosis within a R PCA branch. Neurology was consulted from the ED for further evaluation. Of note, BP is significantly elevated, with systolics in the [id number]. Will lower BP slowly in the event that he has sustained a cerebral infarction.  - Neurology consulted, appreciate recs - MR brain pending - Lipid panel pending - A1c pending - Frequent neuro checks - Permissive hypertension until stroke is ruled out - Continue home crestor 40 mg daily  - PT/OT eval and treat  #Left knee effusion #Tricompartmental osteoarthritis of left knee Patient presented with severe left knee pain and generalized decreased movement on his left side. Knee xray did show a large effusion and tricompartmental osteoarthritis of the left knee. Left knee to be tapped in the ED by Dr. Alvino Chapel. The patient did receive 50  mcg of fentanyl in the ED for pain.  - Fluid culture and cell counts pending - Tylenol 650 mg q6h for mild pain  - Norco 5-325 mg q6h prn for moderate pain pain   #Hypertension On losartan 100 mg, amlodipine 10 mg, clonidine 0.1 mg bid, and metoprolol succinate 50 mg daily at home for hypertension. The patient is unable to name these medications but does know that he takes BP meds. He is unsure if he has missed any doses of these. SBP in the 190-200s upon my initial evaluation. Will lower BP slowly in the event of cerebral infarction. - Start amlodipine 10 mg and metoprolol 50 mg daily  - Hold clonidine and losartan   #COPD Patient has a hx of COPD and notes that he does not use any inhalers regularly, although does have Symbicort on his medication list. He notes that he only uses "as needed" inhalers. - Breo Ellpita 1 puff daily - Albuterol q6h prn   #Type 2 diabetes Last A1c 6.9 in 2020. Patient does not take any medications for his diabetes and has not followed with a PCP in years. - A1c pending - CBGs four times daily   #Gout Has history of gout and reports that he usually gets flares in his big toes. On allopurinol 3000 mg daily. Resumed this medication.    Best practices: Code: Full VTE: SCDs Diet: Regular Family contact: Brother, Darryl Hurley, updated at the bedside Therapy: PT/OT recs pending  Dispo: Admit patient to Observation with expected length of stay less than 2 midnights.  SignedDorethea Clan, DO 02/04/2022, 2:09 PM  PagerBX:[phone] After 5pm on weekdays and 1pm on weekends: On Call pager: [phone]

## 2022-02-04 NOTE — ED Notes (Signed)
Verbal report given to RN on 3W. Per RN, ready for patient.  ?

## 2022-02-04 NOTE — ED Provider Notes (Signed)
?.  Joint Aspiration/Arthrocentesis ? ?Date/Time: 02/04/2022 2:33 PM ?Performed by: Drake Leach, MD ?Authorized by: Benjiman Core, MD  ? ?Consent:  ?  Consent obtained:  Verbal ?  Consent given by:  Patient ?  Risks discussed:  Bleeding, infection, incomplete drainage and pain ?  Alternatives discussed:  No treatment and delayed treatment ?Anesthesia:  ?  Anesthesia method:  Local infiltration ?  Local anesthetic:  Lidocaine 1% w/o epi ?Procedure details:  ?  Preparation: Patient was prepped and draped in usual sterile fashion   ?  Needle gauge:  18 G ?  Ultrasound guidance: no   ?  Approach:  Lateral ?  Aspirate amount:  40cc ?  Aspirate characteristics:  Yellow ?  Steroid injected: no   ?Post-procedure details:  ?  Dressing:  Adhesive bandage ?  Procedure completion:  Tolerated ? ?Electronically signed by: Drake Leach, MD on 02/04/2022 at 2:33 PM ? ?  ?Drake Leach, MD ?02/04/22 1435 ? ?  ?Benjiman Core, MD ?02/04/22 1834 ? ?

## 2022-02-04 NOTE — ED Notes (Signed)
Patient transported to X-ray 

## 2022-02-04 NOTE — ED Notes (Signed)
MD X 2 at bedside performing procedure on L knee.  ?

## 2022-02-04 NOTE — ED Triage Notes (Signed)
Pt from home for eval of six days of L sided weakness and slurred speech. Pt has hx of arthritis in L arm and always has significant pain to same but now has little control and significant weakness to L arm and leg, unable to ambulate. Pt disoriented to time and slow to respond, speech slurred.  ?

## 2022-02-05 ENCOUNTER — Observation Stay (HOSPITAL_COMMUNITY): Payer: Medicaid Other

## 2022-02-05 DIAGNOSIS — I6389 Other cerebral infarction: Secondary | ICD-10-CM

## 2022-02-05 DIAGNOSIS — M25462 Effusion, left knee: Secondary | ICD-10-CM | POA: Diagnosis not present

## 2022-02-05 DIAGNOSIS — E119 Type 2 diabetes mellitus without complications: Secondary | ICD-10-CM | POA: Diagnosis not present

## 2022-02-05 DIAGNOSIS — M7502 Adhesive capsulitis of left shoulder: Secondary | ICD-10-CM

## 2022-02-05 DIAGNOSIS — I10 Essential (primary) hypertension: Secondary | ICD-10-CM | POA: Diagnosis not present

## 2022-02-05 DIAGNOSIS — M109 Gout, unspecified: Secondary | ICD-10-CM | POA: Diagnosis not present

## 2022-02-05 LAB — GLUCOSE, CAPILLARY
Glucose-Capillary: 213 mg/dL — ABNORMAL HIGH (ref 70–99)
Glucose-Capillary: 254 mg/dL — ABNORMAL HIGH (ref 70–99)
Glucose-Capillary: 214 mg/dL — ABNORMAL HIGH (ref 70–99)
Glucose-Capillary: 175 mg/dL — ABNORMAL HIGH (ref 70–99)

## 2022-02-05 LAB — BASIC METABOLIC PANEL
Anion gap: 11 (ref 5–15)
BUN: 13 mg/dL (ref 6–20)
CO2: 25 mmol/L (ref 22–32)
Calcium: 9.2 mg/dL (ref 8.9–10.3)
Chloride: 98 mmol/L (ref 98–111)
Creatinine, Ser: 1.28 mg/dL — ABNORMAL HIGH (ref 0.61–1.24)
GFR, Estimated: >60 mL/min (ref >60)
Glucose, Bld: 184 mg/dL — ABNORMAL HIGH (ref 70–99)
Potassium: 3.5 mmol/L (ref 3.5–5.1)
Sodium: 134 mmol/L — ABNORMAL LOW (ref 135–145)

## 2022-02-05 LAB — ECHOCARDIOGRAM COMPLETE BUBBLE STUDY
AR max vel: 2.84 cm2
AV Area VTI: 3.24 cm2
AV Area mean vel: 2.68 cm2
AV Mean grad: 3 mmHg
AV Peak grad: 5.4 mmHg
Ao pk vel: 1.16 m/s
Area-P 1/2: 4.93 cm2
Calc EF: 42.3 %
MV VTI: 3.63 cm2
S' Lateral: 3 cm
Single Plane A2C EF: 1.3 %
Single Plane A4C EF: 66.8 %

## 2022-02-05 LAB — CBC
HCT: 39.4 % (ref 39.0–52.0)
Hemoglobin: 13.2 g/dL (ref 13.0–17.0)
MCH: 30.6 pg (ref 26.0–34.0)
MCHC: 33.5 g/dL (ref 30.0–36.0)
MCV: 91.2 fL (ref 80.0–100.0)
Platelets: 249 10*3/uL (ref 150–400)
RBC: 4.32 MIL/uL (ref 4.22–5.81)
RDW: 15.7 % — ABNORMAL HIGH (ref 11.5–15.5)
WBC: 10.4 10*3/uL (ref 4.0–10.5)
nRBC: 0 % (ref 0.0–0.2)

## 2022-02-05 LAB — HEMOGLOBIN A1C
Hgb A1c MFr Bld: 6.8 % — ABNORMAL HIGH (ref 4.8–5.6)
Mean Plasma Glucose: 148.46 mg/dL

## 2022-02-05 LAB — HIV ANTIBODY (ROUTINE TESTING W REFLEX): HIV Screen 4th Generation wRfx: NONREACTIVE

## 2022-02-05 LAB — TSH: TSH: 5.515 u[IU]/mL — ABNORMAL HIGH (ref 0.350–4.500)

## 2022-02-05 LAB — LIPID PANEL
Cholesterol: 146 mg/dL (ref 0–200)
HDL: 79 mg/dL (ref >40)
LDL Cholesterol: 47 mg/dL (ref 0–99)
Total CHOL/HDL Ratio: 1.8 RATIO
Triglycerides: 102 mg/dL (ref <150)
VLDL: 20 mg/dL (ref 0–40)

## 2022-02-05 IMAGING — DX DG HAND COMPLETE 3+V*R*
1 series · 3 of 3 positions shown · non-contrast
Comparison: None.

CLINICAL DATA: Pain, arthritis

EXAM:
RIGHT HAND - COMPLETE 3+ VIEW

[Series 1: hand · 0.14mm/px · 3 of 3 slices shown]
[im 1/3]
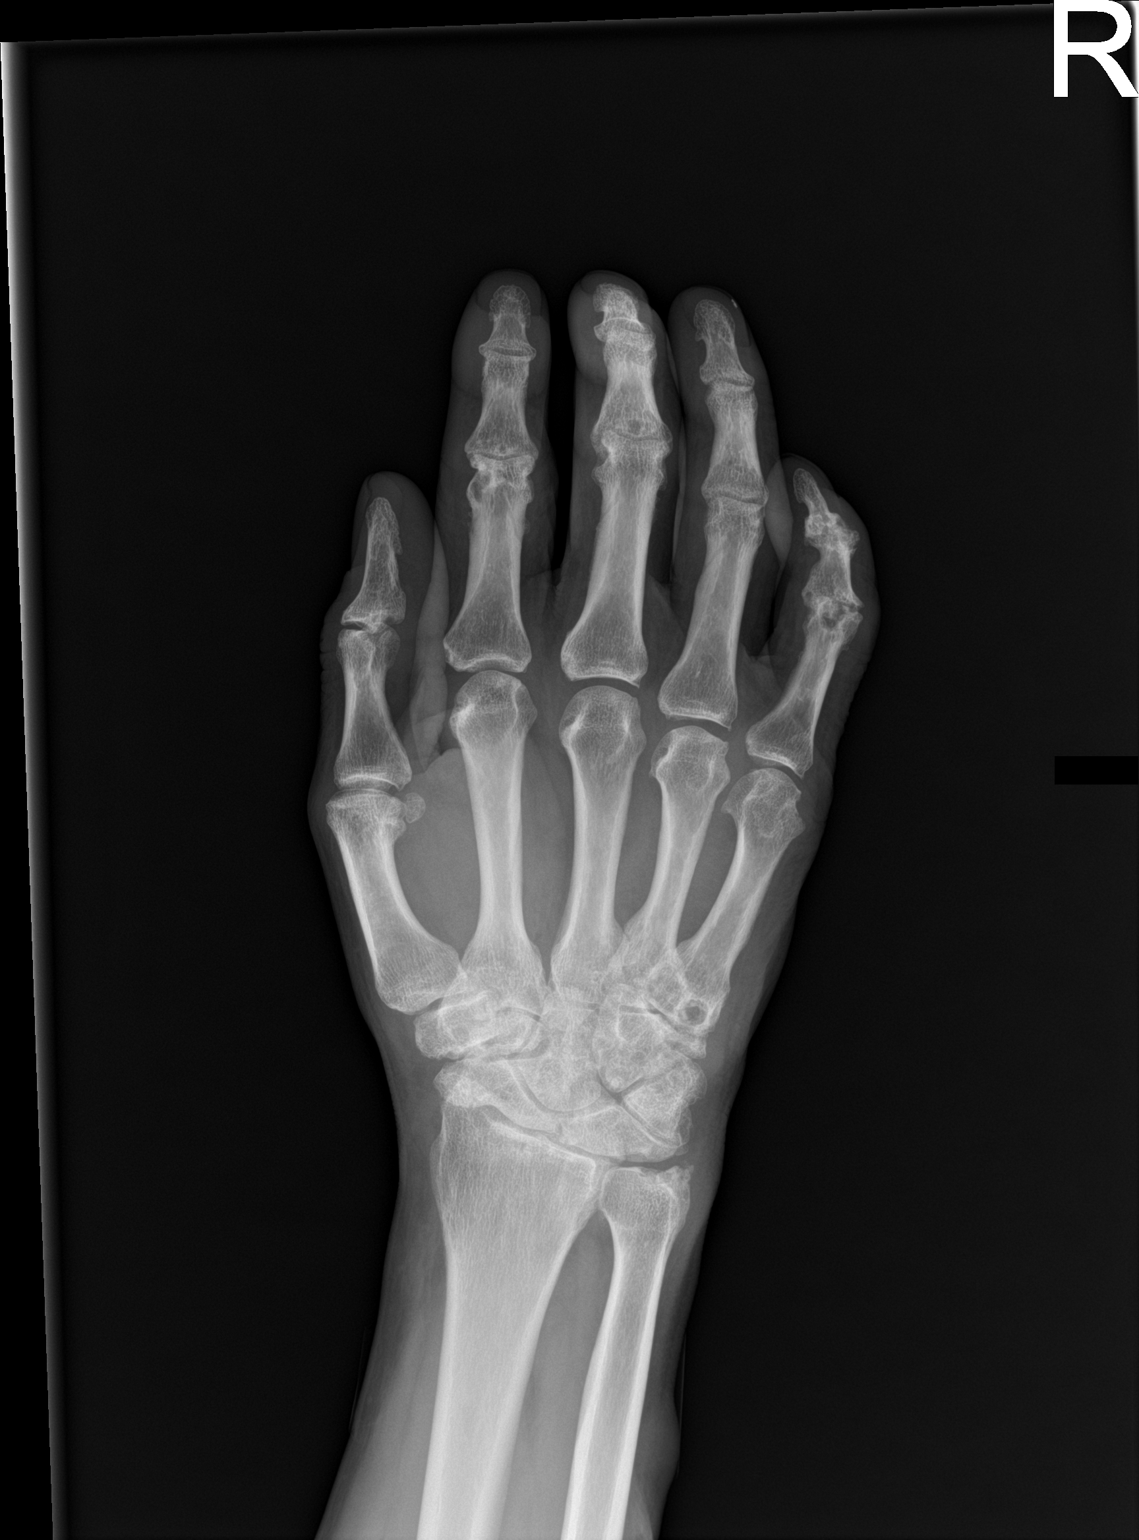
[im 2/3]
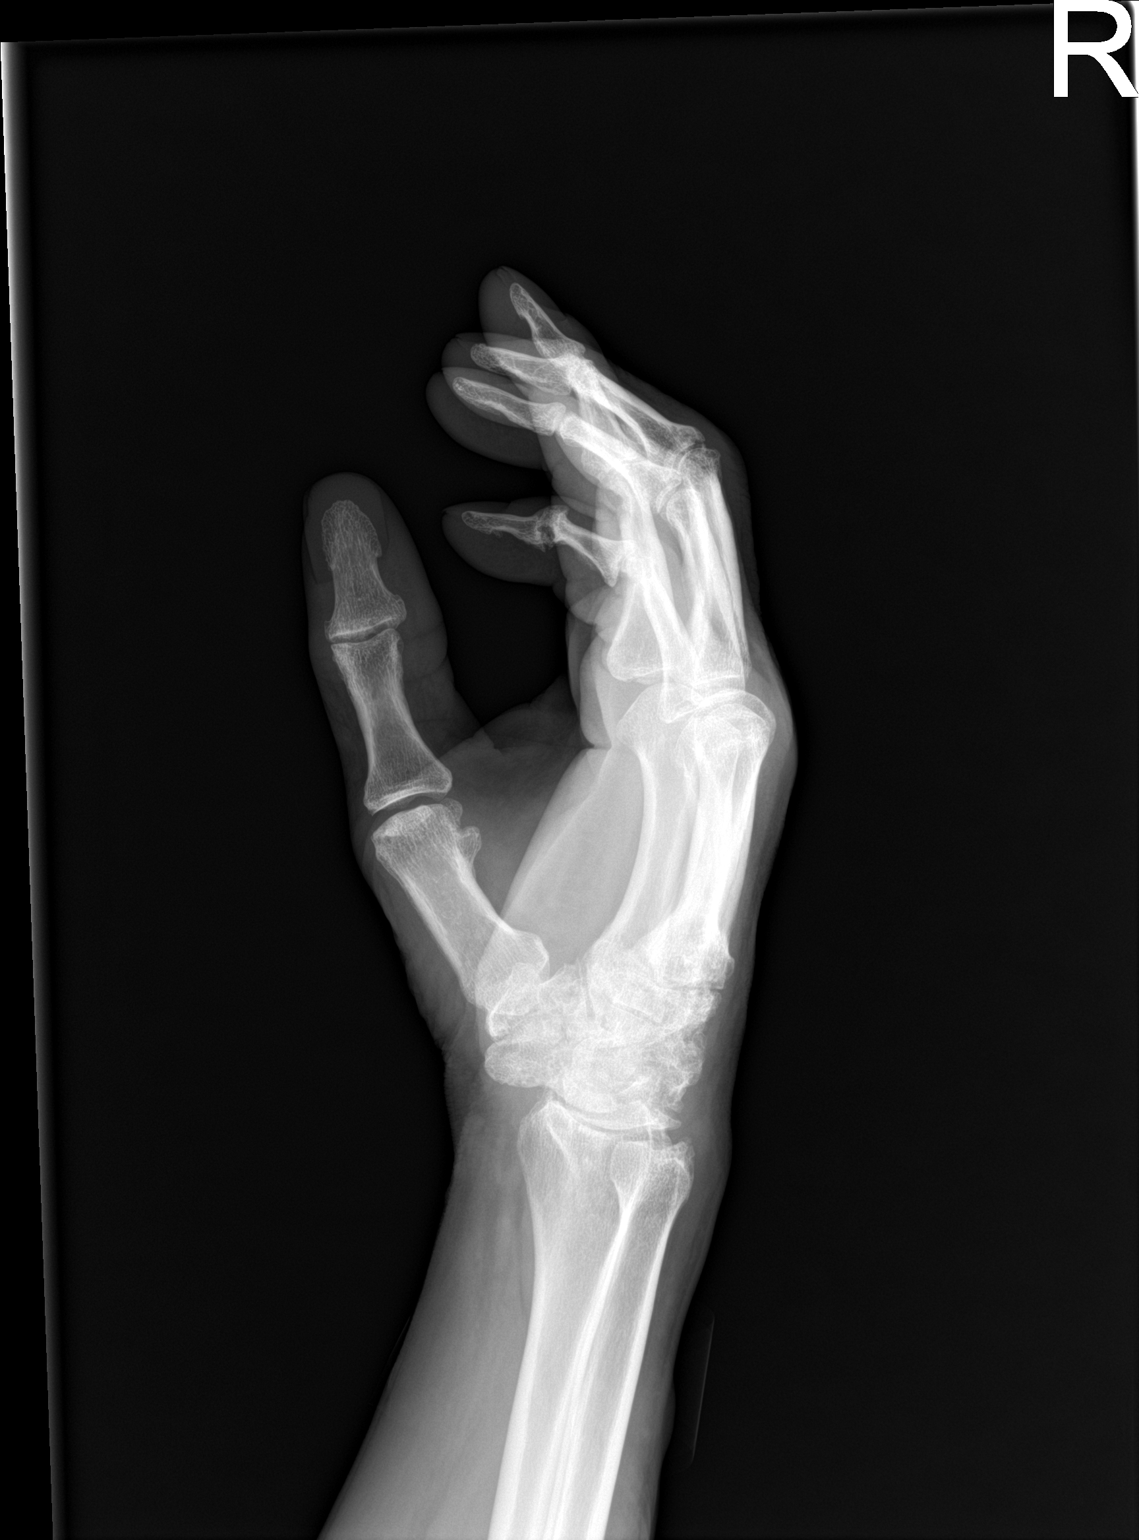
[im 3/3]
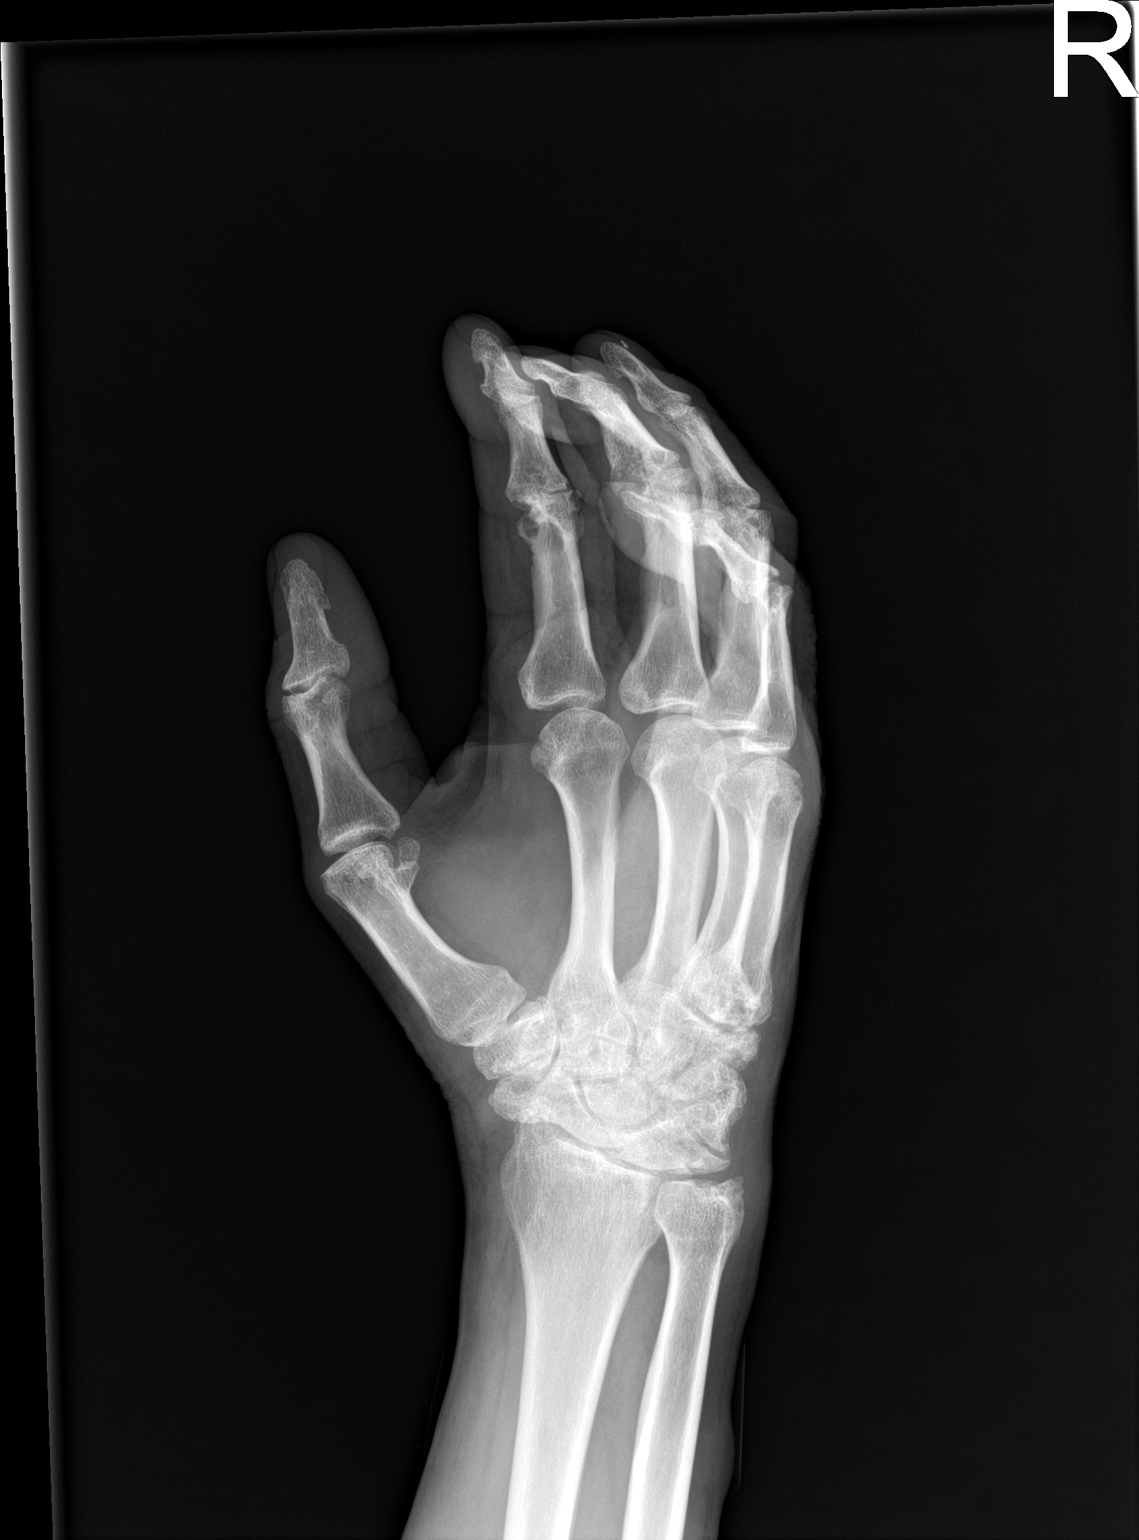

[3 of 3 positions shown; findings below may reference images not displayed]

FINDINGS: No recent fracture or dislocation is seen. Degenerative changes are
noted in the multiple carpometacarpal, metacarpophalangeal and
interphalangeal joints. Degenerative changes are also noted in the
the right wrist. Degenerative changes are particularly severe in the
fifth carpometacarpal joint and PIP and DIP joints of fifth finger.
IMPRESSION: No recent fracture or dislocation is seen. Degenerative changes are
noted in multiple joints as described in the body of the report.

## 2022-02-05 IMAGING — DX DG HAND COMPLETE 3+V*L*
1 series · 3 of 3 positions shown · non-contrast
Comparison: None.

CLINICAL DATA: Pain

EXAM:
LEFT HAND - COMPLETE 3+ VIEW

[Series 1: hand · 0.14mm/px · 3 of 3 slices shown]
[im 1/3]
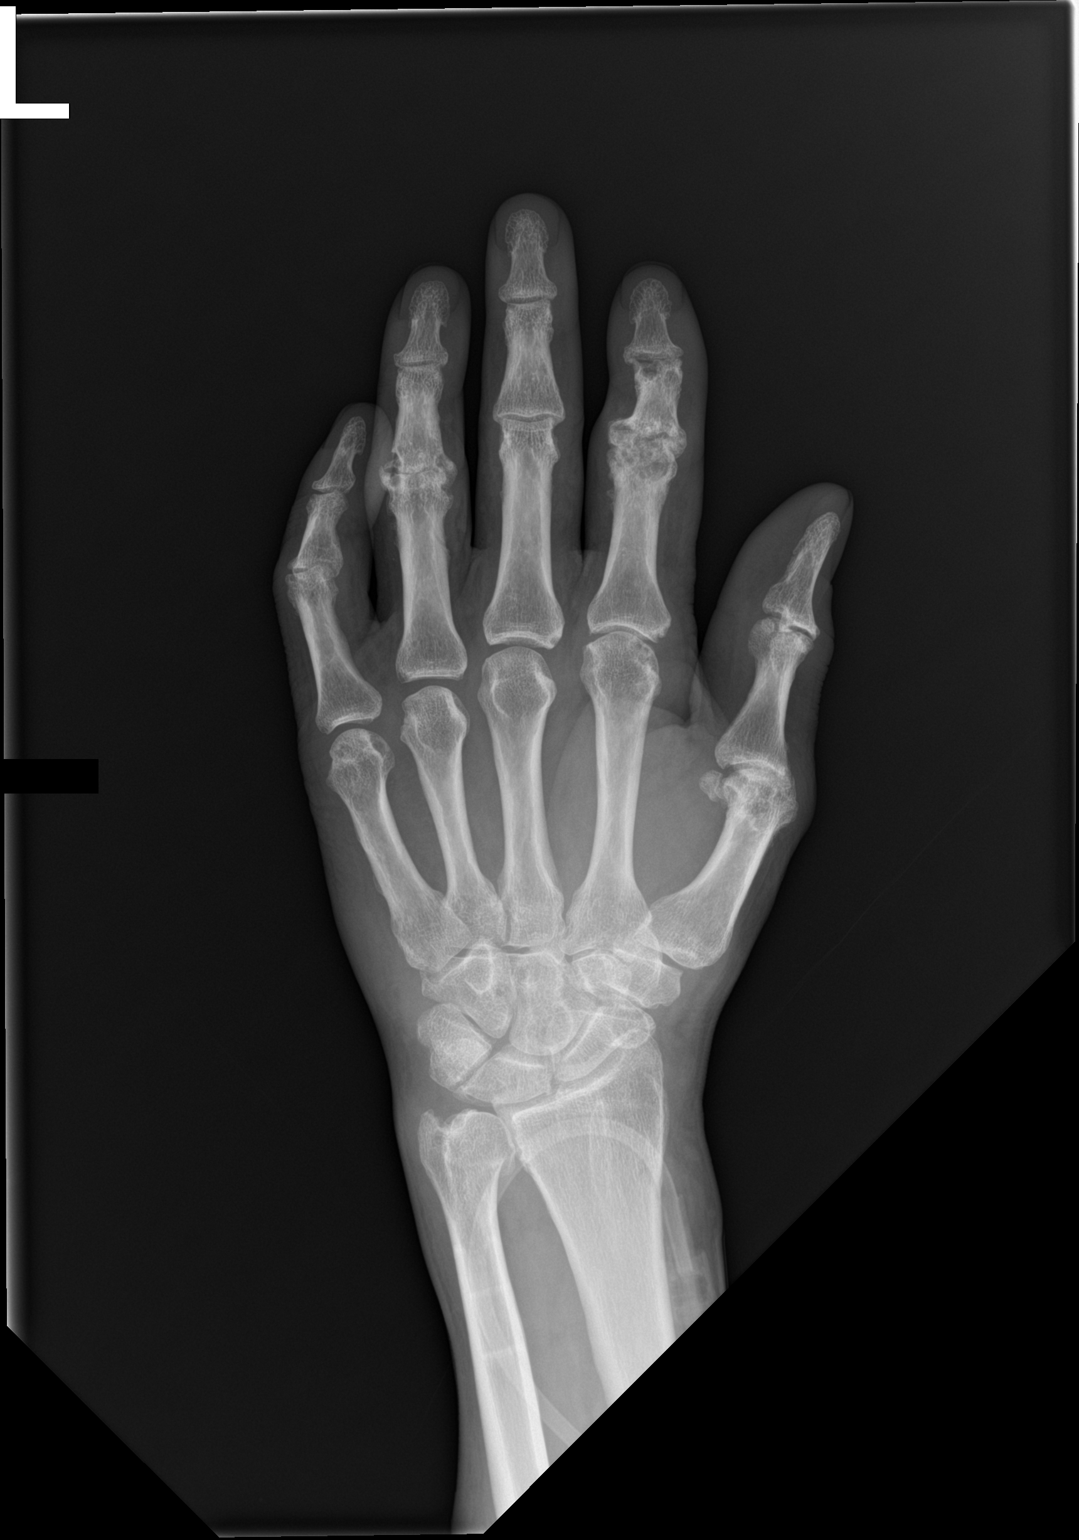
[im 2/3]
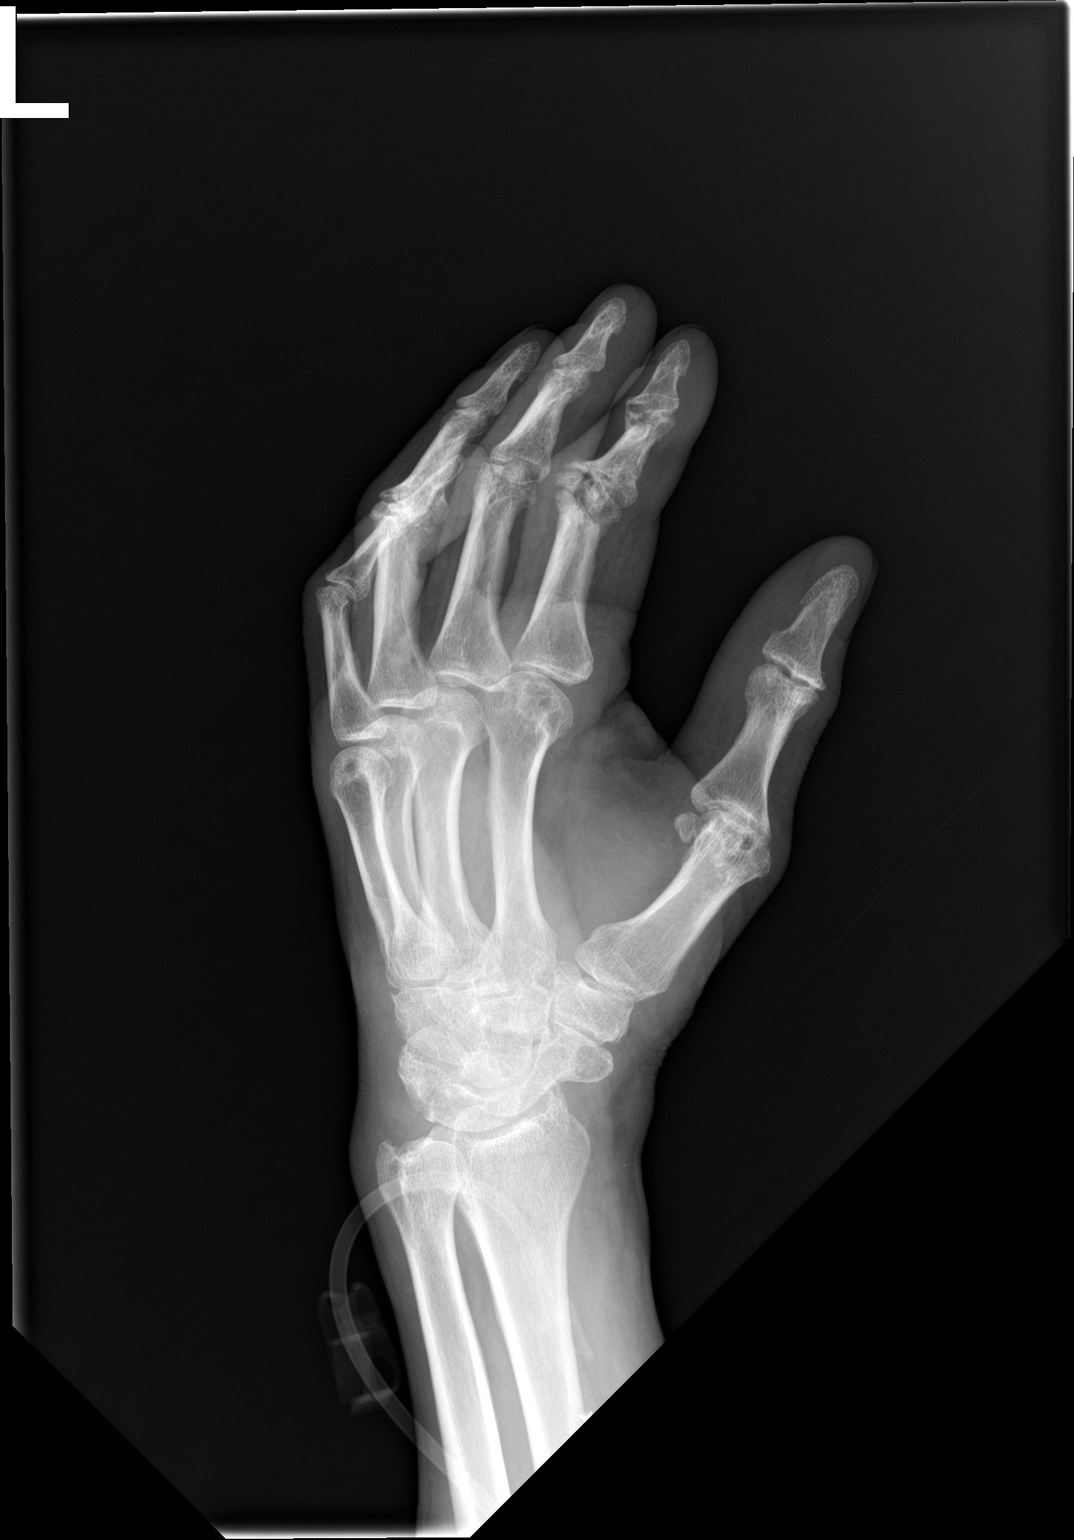
[im 3/3]
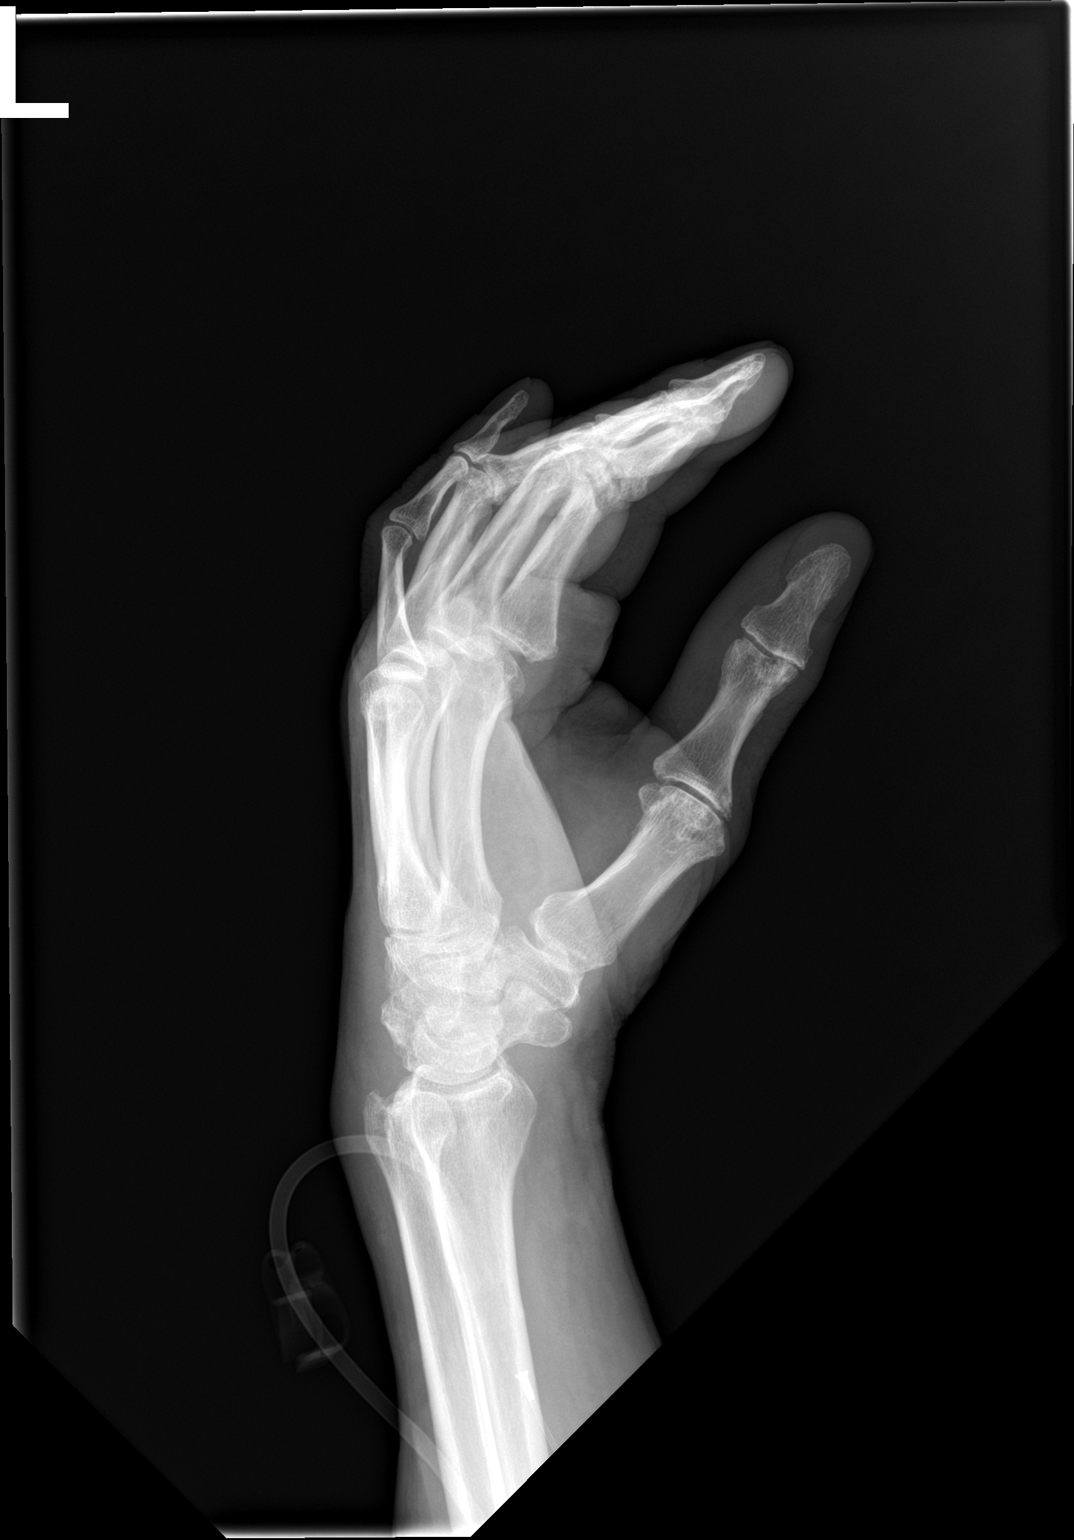

[3 of 3 positions shown; findings below may reference images not displayed]

FINDINGS: No recent fracture or dislocation is seen. Degenerative changes are
noted in multiple carpometacarpal, metacarpophalangeal and
interphalangeal joints. Degenerative changes are particularly severe
in the first metacarpophalangeal joint, distal interphalangeal joint
of index finger, PIP joint of index finger and PIP joint of ring
finger. There are subcortical cysts in multiple phalanges and head
of the first metacarpal, most likely due to degenerative arthritis.
IMPRESSION: No recent fracture or dislocation is seen in the left hand.
Degenerative changes are noted in multiple joints as described in
the body of the report.

## 2022-02-05 IMAGING — MR MR SHOULDER*L* WO/W CM
8 of 9 series · 25 of 40 positions shown · IV contrast (Contrast agent)
Comparison: None.

CLINICAL DATA: Evaluate for labral tear.  Shoulder pain.

EXAM:
MRI OF THE LEFT SHOULDER WITHOUT AND WITH CONTRAST
TECHNIQUE: Multiplanar, multisequence MR imaging of the LEFT shoulder was
performed before and after the administration of intravenous
contrast.
CONTRAST:  8mL GADAVIST GADOBUTROL 1 MMOL/ML IV SOLN

[Series 9: T2 fat-sat · axial · left · 4.0mm · 0.42mm/px · z∈[-184,-54]mm · 4 of 28 slices shown (1 of 3)]
[im 1/28]
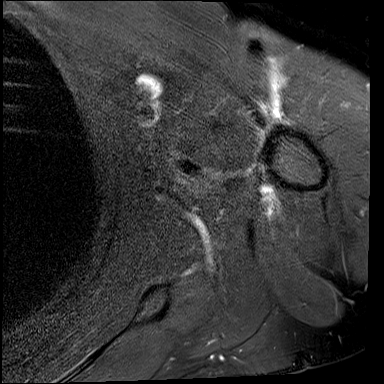
[im 10/28]
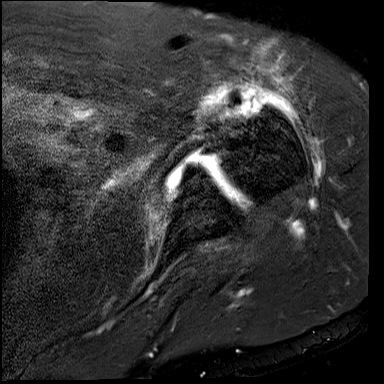
[im 19/28]
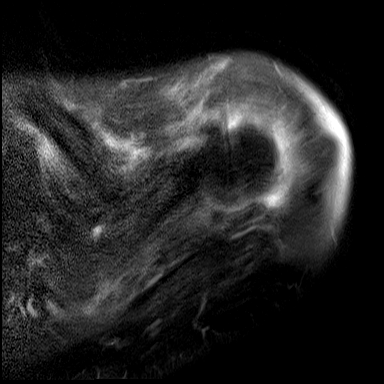
[im 28/28]
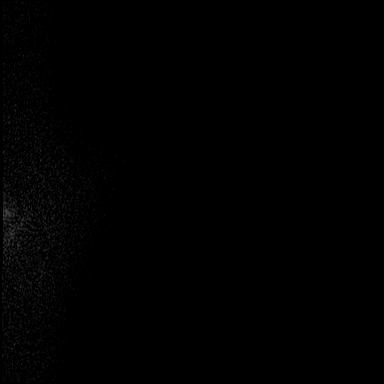

[Series 10: T2 fat-sat · coronal · left · 4.0mm · 0.50mm/px · 3 of 28 slices shown (2 of 3)]
[im 1/28]
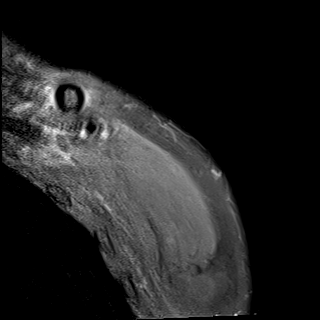
[im 14/28]
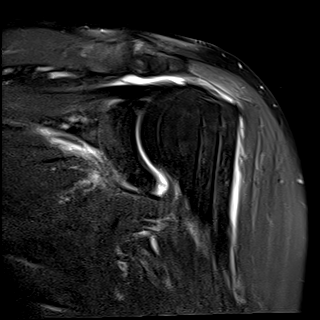
[im 28/28]
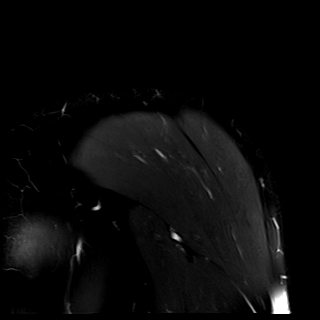

[Series 11: PD fat-sat · coronal · left · 4.0mm · 0.42mm/px · 3 of 28 slices shown]
[im 1/28]
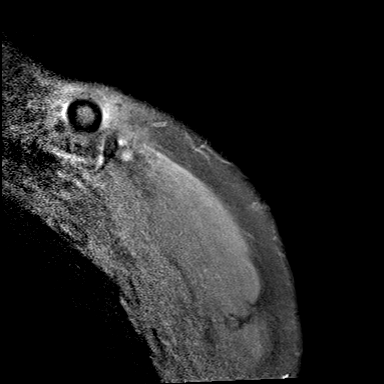
[im 14/28]
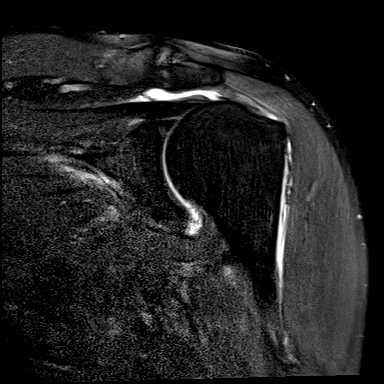
[im 28/28]
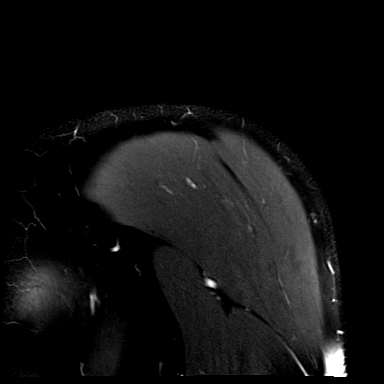

[Series 12: T2 fat-sat · oblique · left · 4.0mm · 0.50mm/px · 3 of 28 slices shown (3 of 3)]
[im 1/28]
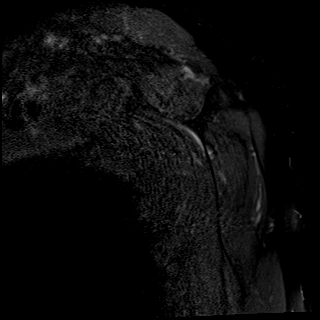
[im 14/28]
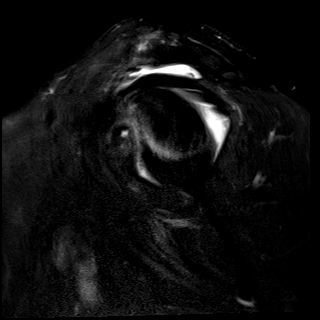
[im 28/28]
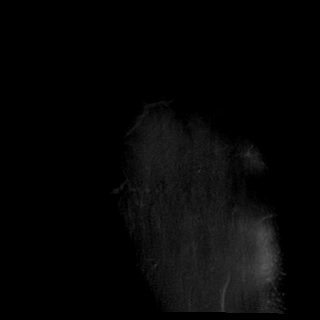

[Series 13: T1 · oblique · left · 4.0mm · 0.42mm/px · 3 of 28 slices shown]
[im 1/28]
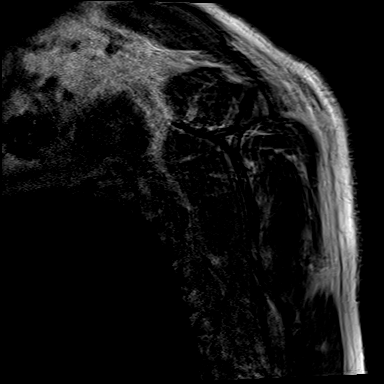
[im 14/28]
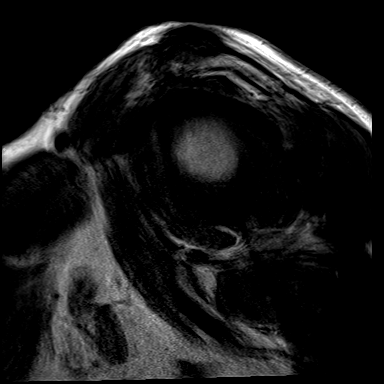
[im 28/28]
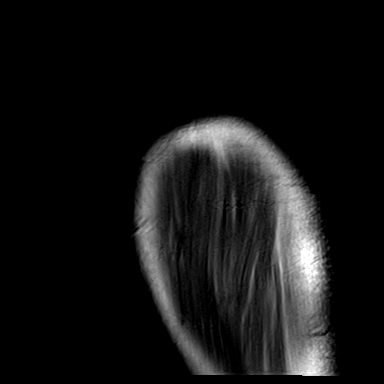

[Series 14: T1 fat-sat · axial · non-contrast · left · 4.0mm · 0.62mm/px · z∈[-184,-54]mm · 3 of 28 slices shown]
[im 1/28]
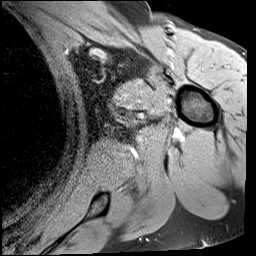
[im 14/28]
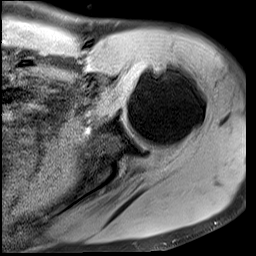
[im 28/28]
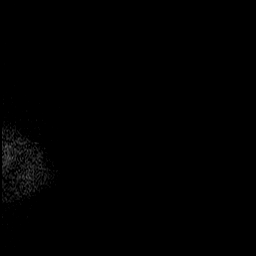

[Series 15: T1 fat-sat post-contrast · axial · left · 4.0mm · 0.62mm/px · z∈[-184,-54]mm · 3 of 28 slices shown (1 of 2)]
[im 1/28]
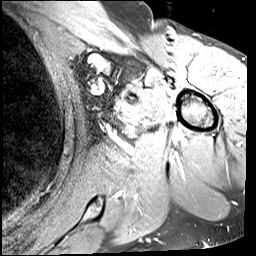
[im 14/28]
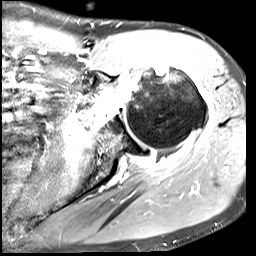
[im 28/28]
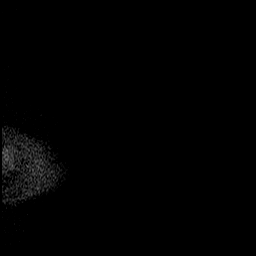

[Series 16: T1 fat-sat post-contrast · coronal · left · 4.0mm · 0.31mm/px · 3 of 28 slices shown (2 of 2)]
[im 1/28]
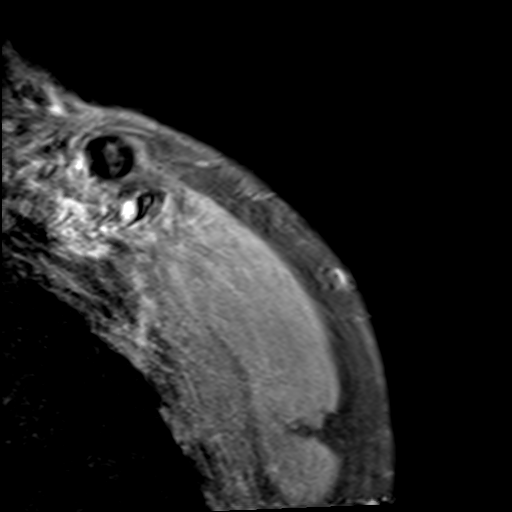
[im 14/28]
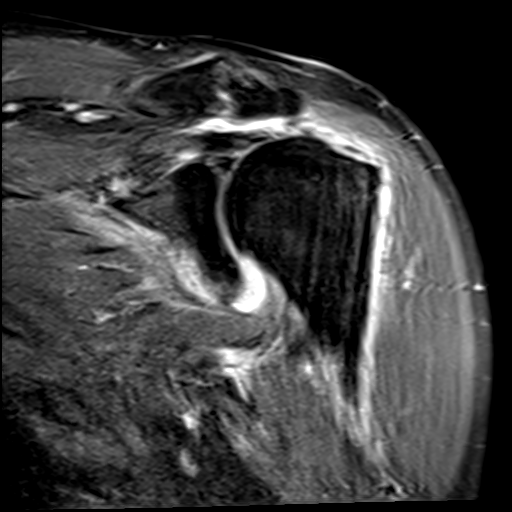
[im 28/28]
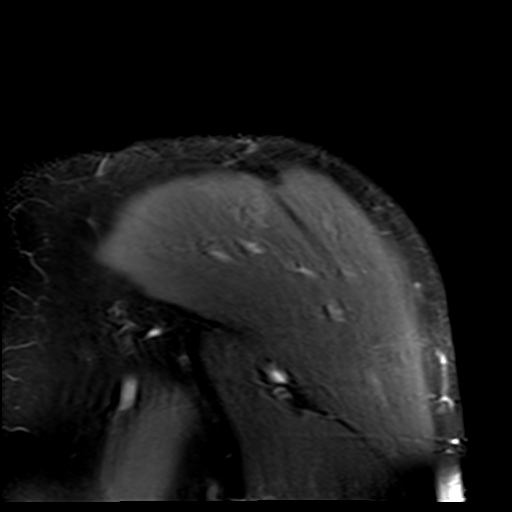

[25 of 40 positions shown; findings below may reference images not displayed]

FINDINGS: Severe patient motion degrades image quality limiting evaluation.

Rotator cuff: Complete tear of the supraspinatus tendon with 2.8 cm
of retraction. Small full-thickness tear of the anterior aspect of
the infraspinatus tendon. Teres minor tendon is intact. Moderate
tendinosis of the subscapularis tendon with a partial-thickness
tear.

Muscles: No muscle atrophy or edema. No intramuscular fluid
collection or hematoma.

Biceps Long Head: Moderate tendinosis of the intra-articular portion
of the long head of the biceps tendon. Medial dislocation of the
proximal extra-articular portion of the long head of the biceps
tendon.

Acromioclavicular Joint: Moderate arthropathy of the
acromioclavicular joint. Small amount of subacromial/subdeltoid
bursal fluid.

Glenohumeral Joint: Moderate joint effusion with synovitis.
Generalized chondral thinning.

Labrum: Grossly intact, but evaluation is limited by lack of
intraarticular fluid/contrast.

Bones: No fracture or dislocation. No aggressive osseous lesion.

Other: No fluid collection or hematoma.
IMPRESSION: 1. Severe patient motion degrades image quality limiting evaluation.
2. Complete tear of the supraspinatus tendon with 2.8 cm of
retraction.
3. Small full-thickness tear of the anterior aspect of the
infraspinatus tendon.
4. Moderate tendinosis of the subscapularis tendon with a
partial-thickness tear.
5. Moderate tendinosis of the intra-articular portion of the long
head of the biceps tendon. Medial dislocation of the proximal
extra-articular portion of the long head of the biceps tendon.
6. Moderate joint effusion with synovitis.

## 2022-02-05 MED ORDER — GADOBUTROL 1 MMOL/ML IV SOLN
8.0000 mL | Freq: Once | INTRAVENOUS | Status: AC | PRN
  Administered 2022-02-05: 8 mL via INTRAVENOUS

## 2022-02-05 MED ORDER — CLONIDINE HCL 0.1 MG PO TABS
0.1000 mg | ORAL_TABLET | Freq: Two times a day (BID) | ORAL | Status: DC
  Administered 2022-02-05 – 2022-02-07 (×4): 0.1 mg via ORAL
  Filled 2022-02-05 (×4): qty 1

## 2022-02-05 MED ORDER — LOSARTAN POTASSIUM 50 MG PO TABS
100.0000 mg | ORAL_TABLET | Freq: Every day | ORAL | Status: DC
  Administered 2022-02-05 – 2022-02-07 (×3): 100 mg via ORAL
  Filled 2022-02-05 (×3): qty 2

## 2022-02-05 NOTE — Evaluation (Signed)
Occupational Therapy Evaluation Patient Details Name: Darryl Hurley MRN: [id number] DOB: 01-Apr-1961 Today's Date: 02/05/2022   History of Present Illness Darryl Hurley is a 61 y.o. male who presented to the ED with left sided pain&weakness, numbness, slightly slurred speech and ?facial droop. CT head with no intracranial abnormality, MRI demonstrated mild-to-moderate spinal canal stenosis at C3-C4; Multilevel uncovertebral and facet arthropathy, worst at C7-T1, where it is severe & left neural foraminal narrowing at every level in the cervical spine. PMH significant for alcohol abuse, anemia, angina pectoris, arthritis, asthma, chronic lower back pain, COPD, previously on 2L  at home, DM, HTN, previous hx of seizures and bilateral knee surgeries.   Clinical Impression   Darryl Hurley was evaluated s/p the above admission list. He reports that he is generally mod I at baseline with use of SPC, he does not work or drive. He lives in a 1 level apartment, 2 STE with his "lady friend" who can assist as needed at d/c. Upon evaluation pt endorsed 10/10 pain in his L extremities and required mod-max fro all mobility. He also required mod-max A for ADLs due to limited strength and mobility of LUE. He is ultimately limited by pain with any movement or weight bearing of L extremities therefore difficult to fully assess strength and functional capabilities. OT to continue to follow acutely to address the limitations listed below. Recommend d/c to AIR to progress back to his mod I baseline.      Recommendations for follow up therapy are one component of a multi-disciplinary discharge planning process, led by the attending physician.  Recommendations may be updated based on patient status, additional functional criteria and insurance authorization.   Follow Up Recommendations  Acute inpatient rehab (3hours/day)    Assistance Recommended at Discharge Frequent or constant Supervision/Assistance  Patient can return  home with the following A lot of help with walking and/or transfers;A lot of help with bathing/dressing/bathroom;Help with stairs or ramp for entrance;Assist for transportation    Functional Status Assessment  Patient has had a recent decline in their functional status and demonstrates the ability to make significant improvements in function in a reasonable and predictable amount of time.  Equipment Recommendations  Tub/shower seat;Wheelchair (measurements OT)    Recommendations for Other Services Rehab consult     Precautions / Restrictions Precautions Precautions: Fall Restrictions Weight Bearing Restrictions: No      Mobility Bed Mobility Overal bed mobility: Needs Assistance Bed Mobility: Supine to Sit     Supine to sit: Mod assist     General bed mobility comments: pt hooks LLE with RLE to move toward EOB    Transfers Overall transfer level: Needs assistance Equipment used: Rolling walker (2 wheels), 1 person hand held assist Transfers: Sit to/from Stand, Bed to chair/wheelchair/BSC Sit to Stand: Max assist Stand pivot transfers: Max assist         General transfer comment: attempted RW, ultimately used face to face to safety SP transfer      Balance Overall balance assessment: Needs assistance Sitting-balance support: Feet supported Sitting balance-Leahy Scale: Fair     Standing balance support: Bilateral upper extremity supported Standing balance-Leahy Scale: Poor                             ADL either performed or assessed with clinical judgement   ADL Overall ADL's : Needs assistance/impaired Eating/Feeding: Set up;Sitting   Grooming: Set up;Sitting   Upper Body Bathing: Moderate  assistance;Sitting   Lower Body Bathing: Maximal assistance   Upper Body Dressing : Moderate assistance;Sitting   Lower Body Dressing: Maximal assistance;With caregiver independent assisting;With adaptive equipment;Sit to/from stand   Toilet Transfer:  Maximal assistance;Stand-pivot;Rolling walker (2 wheels);BSC/3in1   Toileting- Clothing Manipulation and Hygiene: Maximal assistance;Sit to/from stand       Functional mobility during ADLs: Maximal assistance General ADL Comments: limited by L extremity pain adn difficulty sequencing due to internal distraction of pain     Vision Baseline Vision/History: 0 No visual deficits Ability to See in Adequate Light: 0 Adequate Vision Assessment?: No apparent visual deficits     Perception     Praxis      Pertinent Vitals/Pain Pain Assessment Pain Assessment: Faces Faces Pain Scale: Hurts worst Pain Location: L extremeties Pain Descriptors / Indicators: Discomfort, Guarding, Grimacing, Shooting, Sharp Pain Intervention(s): Limited activity within patient's tolerance, Monitored during session     Hand Dominance Right   Extremity/Trunk Assessment Upper Extremity Assessment Upper Extremity Assessment: LUE deficits/detail LUE Deficits / Details: limited movement at hand wrist and elbow, significantly limited by pain. Pt able to elevate shoulders, but unable to move otehrwise against gravity. LUE: Unable to fully assess due to pain LUE Coordination: decreased gross motor   Lower Extremity Assessment Lower Extremity Assessment: Defer to PT evaluation       Communication Communication Communication: No difficulties   Cognition Arousal/Alertness: Awake/alert Behavior During Therapy: WFL for tasks assessed/performed Overall Cognitive Status: No family/caregiver present to determine baseline cognitive functioning Area of Impairment: Following commands, Safety/judgement, Awareness, Problem solving                       Following Commands: Follows one step commands consistently Safety/Judgement: Decreased awareness of safety Awareness: Emergent Problem Solving: Slow processing, Difficulty sequencing, Requires verbal cues General Comments: A&Ox4, limited insight into safety,  fair problem solving with moving LLE wtih hooking method. reuqired one step commands for functional movements     General Comments  VSS on RA, pt reportsed R back pain upon arrival    Exercises     Shoulder Instructions      Home Living Family/patient expects to be discharged to:: Private residence Living Arrangements: Non-relatives/Friends Available Help at Discharge: Friend(s);Available 24 hours/day Type of Home: Apartment Home Access: Stairs to enter     Home Layout: One level     Bathroom Shower/Tub: Teacher, early years/pre: Standard     Home Equipment: Rollator (4 wheels);Cane - single point          Prior Functioning/Environment Prior Level of Function : Independent/Modified Independent             Mobility Comments: SPC for all mobility ADLs Comments: Indep, does not drive        OT Problem List: Decreased range of motion;Decreased strength;Decreased activity tolerance;Impaired balance (sitting and/or standing);Decreased safety awareness;Decreased knowledge of use of DME or AE;Decreased knowledge of precautions;Increased edema      OT Treatment/Interventions: Self-care/ADL training;Therapeutic exercise;Therapeutic activities;Patient/family education;Balance training;DME and/or AE instruction    OT Goals(Current goals can be found in the care plan section) Acute Rehab OT Goals Patient Stated Goal: less pain OT Goal Formulation: With patient Potential to Achieve Goals: Good ADL Goals Pt Will Perform Grooming: with set-up;sitting Pt Will Perform Upper Body Dressing: with set-up;sitting Pt Will Perform Lower Body Dressing: with min guard assist;sit to/from stand Pt Will Transfer to Toilet: with min guard assist;ambulating Pt/caregiver will Perform Home Exercise Program:  Increased ROM;Increased strength;Left upper extremity;Both right and left upper extremity;With written HEP provided  OT Frequency: Min 2X/week    Co-evaluation               AM-PAC OT "6 Clicks" Daily Activity     Outcome Measure Help from another person eating meals?: A Little Help from another person taking care of personal grooming?: A Little Help from another person toileting, which includes using toliet, bedpan, or urinal?: A Lot Help from another person bathing (including washing, rinsing, drying)?: A Lot Help from another person to put on and taking off regular upper body clothing?: A Lot Help from another person to put on and taking off regular lower body clothing?: A Lot 6 Click Score: 14   End of Session Equipment Utilized During Treatment: Gait belt;Rolling walker (2 wheels) Nurse Communication: Mobility status  Activity Tolerance: Patient tolerated treatment well;Patient limited by pain Patient left: in chair;with call bell/phone within reach;with chair alarm set  OT Visit Diagnosis: Unsteadiness on feet (R26.81);Other abnormalities of gait and mobility (R26.89);Muscle weakness (generalized) (M62.81);Pain;Hemiplegia and hemiparesis Hemiplegia - Right/Left: Left Hemiplegia - dominant/non-dominant: Non-Dominant Hemiplegia - caused by: Unspecified                Time: 1001-1025 OT Time Calculation (min): 24 min Charges:  OT General Charges $OT Visit: 1 Visit OT Evaluation $OT Eval Moderate Complexity: 1 Mod OT Treatments $Therapeutic Activity: 8-22 mins   [place] Hippert A Wisdom Seybold 02/05/2022, 10:46 AM

## 2022-02-05 NOTE — Progress Notes (Signed)
Chart review note ? ?Signed out by Dr. Derry Lory to follow-up on MRI of the C-spine which was reviewed.  Report below. ?IMPRESSION: ?1. Evaluation is somewhat limited by motion artifact. Within this ?limitation, congenitally short pedicles and superimposed ?degenerative changes, which cause mild-to-moderate spinal canal ?stenosis at C3-C4 and mild spinal canal stenosis at C4-C5, C6-C7, ?and C7-T1. No definite cord signal abnormality. ?2. Multilevel uncovertebral and facet arthropathy, which overall ?left-greater-than-right and is worst at C7-T1, where it is severe. ?There is at least mild left neural foraminal narrowing at every ?level in the cervical spine, described in detail above. ? ?MRI brain-no stroke ? ?According to the prior examiner, difficult exam-mostly secondary to pain limitation. ? ?I would recommend neurosurgery consultation to evaluate for the C-spine MRI abnormalities-May need outpatient follow-up. ? ?No further inpatient work-up at this time. ? ?Plan relayed to the primary team resident physician via secure chat. ? ?Please call with questions ? ?-- ?Milon Dikes, MD ?Neurologist ?[facility] ?Pager: [phone] ? ?

## 2022-02-05 NOTE — Evaluation (Signed)
Physical Therapy Evaluation ?Patient Details ?Name: Darryl Hurley ?MRN: [id number] ?DOB: 23-Dec-1960 ?Today's Date: 02/05/2022 ? ?History of Present Illness ? Darryl Hurley is a 61 y.o. male who presented to the ED with left sided pain&weakness, numbness, slightly slurred speech and ?facial droop. CT head with no intracranial abnormality, MRI demonstrated mild-to-moderate spinal canal stenosis at C3-C4; Multilevel uncovertebral and facet arthropathy, worst at C7-T1, where it is severe & left neural foraminal narrowing at every level in the cervical spine. PMH significant for alcohol abuse, anemia, angina pectoris, arthritis, asthma, chronic lower back pain, COPD, previously on 2L [place] at home, DM, HTN, previous hx of seizures and bilateral knee surgeries.  ?Clinical Impression ? Pt presents to PT with decr mobility due to lt sided weakness/pain. Unsure what etiology is behind this functional decline but needs more rehab if he is to manage at home.    ?   ? ?Recommendations for follow up therapy are one component of a multi-disciplinary discharge planning process, led by the attending physician.  Recommendations may be updated based on patient status, additional functional criteria and insurance authorization. ? ?Follow Up Recommendations Acute inpatient rehab (3hours/day) (if pt status changed to inpatient) ? ?  ?Assistance Recommended at Discharge Frequent or constant Supervision/Assistance  ?Patient can return home with the following ? Two people to help with walking and/or transfers ? ?  ?Equipment Recommendations Wheelchair (measurements PT);Wheelchair cushion (measurements PT)  ?Recommendations for Other Services ?    ?  ?Functional Status Assessment Patient has had a recent decline in their functional status and demonstrates the ability to make significant improvements in function in a reasonable and predictable amount of time.  ? ?  ?Precautions / Restrictions Precautions ?Precautions: Fall ?Restrictions ?Weight  Bearing Restrictions: No  ? ?  ? ?Mobility ? Bed Mobility ?Overal bed mobility: Needs Assistance ?Bed Mobility: Supine to Sit ?  ?  ?Supine to sit: Mod assist ?  ?  ?General bed mobility comments: Pt up in chair ?  ? ?Transfers ?Overall transfer level: Needs assistance ?Equipment used: Rolling walker (2 wheels), Ambulation equipment used ?Transfers: Sit to/from Stand ?Sit to Stand: Mod assist ?Stand pivot transfers: Max assist ?  ?  ?  ?  ?General transfer comment: Stood from [employer] social research officer and then [employer]. Assist to bring hips up. Pt unable to flex LLE enough to get lt foot under himself to push up to stand. Pt needed assist to place hand on walker or Stedy ?  ? ?Ambulation/Gait ?  ?  ?  ?  ?  ?  ?Pre-gait activities: Pt maintained static standing with min assist with walker or stedy. Verbal/tactile cues to extend hips/knees. ?  ? ?Stairs ?  ?  ?  ?  ?  ? ?Wheelchair Mobility ?  ? ?Modified Rankin (Stroke Patients Only) ?  ? ?  ? ?Balance Overall balance assessment: Needs assistance ?Sitting-balance support: Feet supported ?Sitting balance-Leahy Scale: Fair ?  ?  ?Standing balance support: Bilateral upper extremity supported ?Standing balance-Leahy Scale: Poor ?  ?  ?  ?  ?  ?  ?  ?  ?  ?  ?  ?  ?   ? ? ? ?Pertinent Vitals/Pain Pain Assessment ?Pain Assessment: Faces ?Faces Pain Scale: Hurts even more ?Pain Location: L extremeties ?Pain Descriptors / Indicators: Guarding, Grimacing ?Pain Intervention(s): Monitored during session  ? ? ?Home Living Family/patient expects to be discharged to:: Private residence ?Living Arrangements: Non-relatives/Friends ?Available Help at Discharge: Friend(s);Available 24  hours/day ?Type of Home: Apartment ?Home Access: Stairs to enter ?  ?  ?  ?Home Layout: One level ?Home Equipment: Rollator (4 wheels);Cane - single point ?   ?  ?Prior Function Prior Level of Function : Independent/Modified Independent ?  ?  ?  ?  ?  ?  ?Mobility Comments: SPC for all mobility ?ADLs  Comments: Indep, does not drive ?  ? ? ?Hand Dominance  ? Dominant Hand: Right ? ?  ?Extremity/Trunk Assessment  ? Upper Extremity Assessment ?Upper Extremity Assessment: Defer to OT evaluation ?LUE Deficits / Details: limited movement at hand wrist and elbow, significantly limited by pain. Pt able to elevate shoulders, but unable to move otehrwise against gravity. ?LUE Coordination: decreased gross motor ?  ? ?Lower Extremity Assessment ?Lower Extremity Assessment: LLE deficits/detail ?LLE Deficits / Details: Limited strength and ROM. Difficult to determine if this is due to pain or is a true weakness. ?  ? ?   ?Communication  ? Communication: No difficulties  ?Cognition Arousal/Alertness: Awake/alert ?Behavior During Therapy: [facility] for tasks assessed/performed ?Overall Cognitive Status: No family/caregiver present to determine baseline cognitive functioning ?Area of Impairment: Following commands, Safety/judgement, Awareness, Problem solving ?  ?  ?  ?  ?  ?  ?  ?  ?  ?  ?  ?Following Commands: Follows one step commands consistently ?Safety/Judgement: Decreased awareness of safety ?Awareness: Emergent ?Problem Solving: Slow processing, Difficulty sequencing, Requires verbal cues ?General Comments: A&Ox4, limited insight into safety, fair problem solving with moving LLE wtih hooking method. reuqired one step commands for functional movements ?  ?  ? ?  ?General Comments General comments (skin integrity, edema, etc.): 138/98 ? ?  ?Exercises    ? ?Assessment/Plan  ?  ?PT Assessment Patient needs continued PT services  ?PT Problem List Decreased strength;Decreased activity tolerance;Decreased balance;Decreased mobility;Decreased range of motion;Pain ? ?   ?  ?PT Treatment Interventions DME instruction;Gait training;Stair training;Functional mobility training;Therapeutic activities;Therapeutic exercise;Balance training;Patient/family education   ? ?PT Goals (Current goals can be found in the Care Plan section)  ?Acute  Rehab PT Goals ?Patient Stated Goal: go home ?PT Goal Formulation: With patient ?Time For Goal Achievement: 02/19/22 ?Potential to Achieve Goals: Good ? ?  ?Frequency Min 3X/week ?  ? ? ?Co-evaluation   ?  ?  ?  ?  ? ? ?  ?AM-PAC PT "6 Clicks" Mobility  ?Outcome Measure Help needed turning from your back to your side while in a flat bed without using bedrails?: A Little ?Help needed moving from lying on your back to sitting on the side of a flat bed without using bedrails?: A Lot ?Help needed moving to and from a bed to a chair (including a wheelchair)?: A Lot ?Help needed standing up from a chair using your arms (e.g., wheelchair or bedside chair)?: A Lot ?Help needed to walk in hospital room?: Total ?Help needed climbing 3-5 steps with a railing? : Total ?6 Click Score: 11 ? ?  ?End of Session Equipment Utilized During Treatment: Gait belt ?Activity Tolerance: Patient tolerated treatment well ?Patient left: in chair;with call bell/phone within reach;with chair alarm set ?Nurse Communication: Mobility status ?PT Visit Diagnosis: Other abnormalities of gait and mobility (R26.89) ?  ? ?Time: 1206-1223 ?PT Time Calculation (min) (ACUTE ONLY): 17 min ? ? ?Charges:   PT Evaluation ?$PT Eval Moderate Complexity: 1 Mod ?  ?  ?   ? ? ?[facility] PT ?Acute Rehabilitation Services ?Pager [phone] ?Office [phone] ? ? ?Angelina Ok  [place] ?02/05/2022, 2:30 PM ? ?

## 2022-02-05 NOTE — Progress Notes (Signed)
?  Transition of Care (TOC) Screening Note ? ? ?Patient Details  ?Name: Darryl Hurley ?Date of Birth: 01/02/1961 ? ? ?Transition of Care (TOC) CM/SW Contact:    ?Kermit Balo, RN ?Phone Number: ?02/05/2022, 2:53 PM ? ? ? ?Transition of Care Department [facility]) has reviewed patient. We will continue to monitor patient advancement through interdisciplinary progression rounds. If new patient transition needs arise, please place a TOC consult. ?  ?

## 2022-02-05 NOTE — Progress Notes (Signed)
Inpatient Diabetes Program Recommendations ? ?AACE/ADA: New Consensus Statement on Inpatient Glycemic Control (2015) ? ?Target Ranges:  Prepandial:   less than 140 mg/dL ?     Peak postprandial:   less than 180 mg/dL (1-2 hours) ?     Critically ill patients:  140 - 180 mg/dL  ? ?Lab Results  ?Component Value Date  ? GLUCAP 213 (H) 02/05/2022  ? HGBA1C 6.8 (H) 02/05/2022  ? ? ?Review of Glycemic Control ? Latest Reference Range & Units 02/04/22 09:50 02/04/22 21:23 02/05/22 06:13  ?Glucose-Capillary 70 - 99 mg/dL 001 (H) 749 (H) 449 (H)  ?(H): Data is abnormally high ? ?Diabetes history: DM2 ?Outpatient Diabetes medications: None ?Current orders for Inpatient glycemic control: None ? ?Inpatient Diabetes Program Recommendations:   ?While in hospital: ?-Add Novolog correction 0-9 units tid ?Secure chat sent to resident Rayann Atway. ? ?Thank you, ?Billy Fischer Becker Christopher, RN, MSN, CDE  ?Diabetes Coordinator ?Inpatient Glycemic Control Team ?Team Pager [phone] (8am-5pm) ?02/05/2022 9:41 AM ? ? ? ? ?

## 2022-02-05 NOTE — Progress Notes (Addendum)
? ?HD#1 ?SUBJECTIVE:  ?Patient Summary: Darryl Hurley is a 61 y.o. with a pertinent PMH of COPD, HTN, type 2 diabetes, and previous hx of seizures, who presented with left sided pain and weakness and admitted for stroke rule out.  ? ?Overnight Events: No acute events overnight. ? ?Interim History: This is hospital day 1 for [name] who was seen and evaluated at the bedside this morning. Knee feels a little better after fluid was pulled off, but he feels like his range of motion is limited because of the pain.  ? ?OBJECTIVE:  ?Vital Signs: ?Vitals:  ? 02/04/22 2324 02/05/22 0311 02/05/22 0840 02/05/22 1209  ?BP: (!) 164/99 (!) 174/103 (!) 151/113 (!) 138/98  ?Pulse: 90 90 95 (!) 104  ?Resp: 20 20 18 18   ?Temp: 99.8 ?F (37.7 ?C) 98.4 ?F (36.9 ?C)  98.3 ?F (36.8 ?C)  ?TempSrc: Oral Oral    ?SpO2: 93% 92% 93%   ? ?Supplemental O2: Room Air ?SpO2: 93 % ? ?There were no vitals filed for this visit. ? ? ?Intake/Output Summary (Last 24 hours) at 02/05/2022 1413 ?Last data filed at 02/05/2022 1200 ?Gross per 24 hour  ?Intake 240 ml  ?Output 925 ml  ?Net -685 ml  ? ?Net IO Since Admission: -1,085 mL [02/05/22 1413] ? ?Physical Exam: ?General: No acute distress.  ?CV: RRR. No murmurs, rubs, or gallops.  ?Pulmonary: Lungs CTAB. Normal effort. No wheezing or rales. ?Abdominal: Soft, nontender, nondistended. Normal bowel sounds. ?Extremities/MSK: Palpable radial and DP pulses. Left knee with less edema today, although still has significant tenderness to palpation. PIP and DIP on bilateral hands are edematous, consistent with arthritic changes. ?Skin: Warm and dry.  ?Neuro: Alert and oriented. Sensation intact. 3/5 LUE and LLE strength. 5/5 strength RUE and RLE.  ?Psych: Normal mood and affect ? ? ? ? ?ASSESSMENT/PLAN:  ?Assessment: ?Principal Problem: ?  Adhesive capsulitis of left shoulder ?Active Problems: ?  Primary hypertension ?  Gout ?  Effusion of left knee ? ? ?Plan: ?#Left sided weakness, cerebral infarction ruled  out ?#Possible left adhesive capsulitis ?#Left knee effusion s/p arthrocentesis  ?Left sided weakness is likely secondary to severe facet arthropathy of C7-T1, but also moreso likely related to possible adhesive capsulitis of left shoulder and also left knee effusion s/p arthrocentesis (40 cc of cloudy yellow fluid was removed from the left knee and WBC was 17,450 with 92% neutrophils). MRI brain ruled out any acute intracranial abnormality. LDL is 47, A1c slightly elevated at 6.8. Will further evaluate shoulder with MRI and also will evaluate possible arthritis with bilateral and xrays today.  ?- PT/OT eval and treat ?- Bilateral hand xrays ?- MRI L shoulder  ?- Consider glucocorticoid injection in left knee ? ?#Hypertension ?SBP consistently elevated in the 170s. Patient is on losartan 100 mg, amlodipine 10 mg, clonidine 0.1 mg bid, and metoprolol succinate 50 mg daily. Resumed medications slowly yesterday, as cerebral infarciton had not yet been ruled out.  ?- Continue amlodipine 10 mg ?- Contiue metoprolol 50 mg daily ?- Start losartan 100 mg  ?- Start clonidine 0.1 mg bid ? ?#Type 2 diabetes ?A1c 6.8, not on any medication.  ? ?#Gout ?Has history of gout and reports that he usually gets flares in his big toes. On allopurinol 3000 mg daily. Resumed this medication.  ? ?Best Practice: ?Diet: Regular diet ?IVF: Fluids: none ?VTE: SCDs Start: 02/04/22 1341 ?Code: Full ?AB: none ?Therapy Recs: Pending ?DISPO: Anticipated discharge to Home pending Medical stability. ? ?  Signature: ?Elza Rafter, D.O.  ?Internal Medicine Resident, PGY-1 ?[facility]  ?Pager: [phone] ?2:13 PM, 02/05/2022  ? ?Please contact the on call pager after 5 pm and on weekends at [phone]. ? ?

## 2022-02-05 NOTE — Progress Notes (Signed)
Patient ID: Darryl Hurley, male   DOB: 1961-07-05, 61 y.o.   MRN: [id number] ?BP (!) 138/98 (BP Location: Left Arm)   Pulse (!) 104   Temp 98.3 ?F (36.8 ?C)   Resp 18   SpO2 93%  ?Films reviewed.  ?Allergies  ?Allergen Reactions  ? Nsaids Other (See Comments)  ?  Vagale down reaction  ? Toradol [Ketorolac Tromethamine]   ?  vagale down reaction  ? ?Past Medical History:  ?Diagnosis Date  ? Alcohol abuse   ? Anemia   ? Angina pectoris   ? Arthritis   ? Asthma   ? Chronic lower back pain   ? COPD (chronic obstructive pulmonary disease) (HCC)   ? on home O2 2L  ? Diabetes mellitus without complication (HCC)   ? DVT (deep venous thrombosis) (HCC)   ? bilateral  ? Exertional shortness of breath   ? Gout   ? Hypertension   ? Seizures (HCC)   ? ?Past Surgical History:  ?Procedure Laterality Date  ? INCISION AND DRAINAGE ABSCESS Bilateral 11/19/2019  ? Procedure: INCISION AND DRAINAGE BILATERAL SEPTIC KNEES, ASPIRATION OF LEFT KNEE;  Surgeon: Roby Lofts, MD;  Location: [facility] OR;  Service: Orthopedics;  Laterality: Bilateral;  ? INGUINAL HERNIA REPAIR Bilateral 09/01/2020  ? Procedure: BILATERAL LAPAROSCOPIC INGUINAL HERNIA REPAIR WITH MESH;  Surgeon: Abigail Miyamoto, MD;  Location: [facility] OR;  Service: General;  Laterality: Bilateral;  ? INSERTION OF MESH Bilateral 09/01/2020  ? Procedure: INSERTION OF MESH;  Surgeon: Abigail Miyamoto, MD;  Location: [facility] OR;  Service: General;  Laterality: Bilateral;  ? NO PAST SURGERIES    ? ?Family History  ?Problem Relation Age of Onset  ? Diabetes type II Mother   ? Depression Father   ? Suicidality Father   ? Asthma Brother   ? ?Social History  ? ?Socioeconomic History  ? Marital status: Single  ?  Spouse name: Not on file  ? Number of children: 2  ? Years of education: 11th  ? Highest education level: Not on file  ?Occupational History  ?  Employer: OTHER  ?  Comment: disability  ?Tobacco Use  ? Smoking status: Not on file  ? Smokeless tobacco: Never  ? Tobacco comments:  ?   05/12/2013 'eased off smoking since last month"  ?Vaping Use  ? Vaping Use: Never used  ?Substance and Sexual Activity  ? Alcohol use: Yes  ?  Comment: 2 pts of beer a night, 2 pts of liquor a week  ? Drug use: No  ?  Types: "Crack" cocaine  ? Sexual activity: Never  ?Other Topics Concern  ? Not on file  ?Social History Narrative  ? ** Merged History Encounter **  ?    ? Patient lives at home with friend.  ? Caffeine Use: none  ? ?Social Determinants of Health  ? ?Financial Resource Strain: Not on file  ?Food Insecurity: Not on file  ?Transportation Needs: Not on file  ?Physical Activity: Not on file  ?Stress: Not on file  ?Social Connections: Not on file  ?Intimate Partner Violence: Not on file  ? ?Prior to Admission medications   ?Medication Sig Start Date End Date Taking? Authorizing Provider  ?acetaminophen (TYLENOL) 325 MG tablet Take 2 tablets (650 mg total) by mouth every 6 (six) hours as needed. ?Patient taking differently: Take 325 mg by mouth every 6 (six) hours as needed for moderate pain. 05/20/19   Merrilee Jansky, MD  ?albuterol (PROVENTIL HFA;VENTOLIN HFA) 108 (  90 BASE) MCG/ACT inhaler Inhale 1 puff into the lungs every 6 (six) hours as needed for wheezing or shortness of breath.    [provider]  ?allopurinol (ZYLOPRIM) 300 MG tablet Take 300 mg by mouth daily. 02/02/21   [provider]  ?amLODipine (NORVASC) 10 MG tablet Take 10 mg by mouth daily. 08/06/18   [provider]  ?aspirin EC 81 MG tablet Take 1 tablet (81 mg total) by mouth daily. Swallow whole. 04/07/21   Patwardhan, Anabel Bene, MD  ?cloNIDine (CATAPRES) 0.1 MG tablet Take 0.1 mg by mouth 2 (two) times daily. 08/15/18   [provider]  ?colchicine 0.6 MG tablet Take 0.6 mg by mouth daily as needed (gout flare). 08/15/20   [provider]  ?fluticasone (FLONASE) 50 MCG/ACT nasal spray Place 1 spray into both nostrils daily. 02/21/21   [provider]  ?gabapentin (NEURONTIN) 600 MG tablet  Take 600 mg by mouth 3 (three) times daily. 09/12/20   [provider]  ?lidocaine (LIDODERM) 5 % Place 1 patch onto the skin daily. 02/21/21   [provider]  ?losartan (COZAAR) 100 MG tablet Take 100 mg by mouth daily. 08/13/20   [provider]  ?metoprolol succinate (TOPROL-XL) 50 MG 24 hr tablet Take 1 tablet (50 mg total) by mouth daily. Take with or immediately following a meal. 04/06/21 04/07/22  Patwardhan, Anabel Bene, MD  ?nitroGLYCERIN (NITROSTAT) 0.4 MG SL tablet Place 1 tablet (0.4 mg total) under the tongue every 5 (five) minutes x 3 doses as needed for chest pain. 02/11/21   Joy, Shawn C, PA-C  ?oxyCODONE (OXY IR/ROXICODONE) 5 MG immediate release tablet Take 5 mg by mouth every 6 (six) hours as needed for severe pain.    [provider]  ?rosuvastatin (CRESTOR) 40 MG tablet Take 40 mg by mouth daily.    [provider]  ?SYMBICORT 160-4.5 MCG/ACT inhaler Inhale 2 puffs into the lungs 2 (two) times daily. 11/05/19   [provider]  ?labetalol (NORMODYNE) 200 MG tablet Take 1 tablet (200 mg total) by mouth 2 (two) times daily. ?Patient not taking: Reported on 03/25/2019 02/16/19 05/20/19  Elder Negus, MD  ?Mr. Sark states he has had weakness in the left upper extremity since thanksgiving 2022.Pain in his left lower extremity and in his left upper extremity led to his visit to the ED yesterday.  ?Physical Exam ?Constitutional:   ?   Appearance: He is ill-appearing.  ?HENT:  ?   Head: Normocephalic and atraumatic.  ?   Right Ear: External ear normal.  ?   Left Ear: External ear normal.  ?   Nose: Nose normal.  ?   Mouth/Throat:  ?   Mouth: Mucous membranes are moist.  ?   Pharynx: Oropharynx is clear.  ?Eyes:  ?   Extraocular Movements: Extraocular movements intact.  ?   Pupils: Pupils are equal, round, and reactive to light.  ?Pulmonary:  ?   Effort: Pulmonary effort is normal.  ?Abdominal:  ?   General: Abdomen is flat.  ?   Palpations: Abdomen is  soft.  ?Musculoskeletal:  ?   Cervical back: Normal range of motion.  ?   Comments: Decreased rom left shoulder.  ?Decreased rom left elbow, both joints painful with passive movement.  ?Skin: ?   General: Skin is warm and dry.  ?Neurological:  ?   Mental Status: He is alert and oriented to person, place, and time.  ?   Motor: Weakness  present.  ?Psychiatric:     ?   Mood and Affect: Mood normal.     ?   Behavior: Behavior normal.     ?   Thought Content: Thought content normal.  ?MRI reviewed cervical spine. There is no anatomical findings to explain the shoulder pain and weakness. Agree with mri left shoulder, since pain is severe with passive movement.  ?Left lower extremity is exquisitely tender to touch and passive movement. Left knee edematous.  ?Do not believe Mr. Roethler is an operative candidate ? ?

## 2022-02-05 NOTE — Progress Notes (Signed)
Inpatient Rehab Admissions Coordinator:  ? ?Per therapy recommendation,  patient was screened for CIR candidacy by Megan Salon, MS, CCC-SLP. At this time, Pt. Remains observation status with neurosurgery consult pending. I will not place a consult at this time but will follow and place a consult if status is changed to inpatient and Pt. Appears to be an appropriate candidate. Please contact me any with questions. ? ?Megan Salon, MS, CCC-SLP ?Rehab Admissions Coordinator  ?[phone] (celll) ?[phone] (office) ? ? ?

## 2022-02-06 ENCOUNTER — Inpatient Hospital Stay (HOSPITAL_COMMUNITY): Payer: Medicaid Other

## 2022-02-06 DIAGNOSIS — J449 Chronic obstructive pulmonary disease, unspecified: Secondary | ICD-10-CM | POA: Diagnosis present

## 2022-02-06 DIAGNOSIS — M109 Gout, unspecified: Secondary | ICD-10-CM | POA: Diagnosis present

## 2022-02-06 DIAGNOSIS — I6523 Occlusion and stenosis of bilateral carotid arteries: Secondary | ICD-10-CM | POA: Diagnosis present

## 2022-02-06 DIAGNOSIS — F17210 Nicotine dependence, cigarettes, uncomplicated: Secondary | ICD-10-CM | POA: Diagnosis present

## 2022-02-06 DIAGNOSIS — E785 Hyperlipidemia, unspecified: Secondary | ICD-10-CM | POA: Diagnosis present

## 2022-02-06 DIAGNOSIS — Z833 Family history of diabetes mellitus: Secondary | ICD-10-CM | POA: Diagnosis not present

## 2022-02-06 DIAGNOSIS — M12812 Other specific arthropathies, not elsewhere classified, left shoulder: Secondary | ICD-10-CM | POA: Diagnosis present

## 2022-02-06 DIAGNOSIS — E876 Hypokalemia: Secondary | ICD-10-CM | POA: Diagnosis present

## 2022-02-06 DIAGNOSIS — N189 Chronic kidney disease, unspecified: Secondary | ICD-10-CM | POA: Diagnosis present

## 2022-02-06 DIAGNOSIS — M19012 Primary osteoarthritis, left shoulder: Secondary | ICD-10-CM | POA: Diagnosis present

## 2022-02-06 DIAGNOSIS — M7502 Adhesive capsulitis of left shoulder: Secondary | ICD-10-CM | POA: Diagnosis present

## 2022-02-06 DIAGNOSIS — F1010 Alcohol abuse, uncomplicated: Secondary | ICD-10-CM | POA: Diagnosis present

## 2022-02-06 DIAGNOSIS — R4701 Aphasia: Secondary | ICD-10-CM | POA: Diagnosis present

## 2022-02-06 DIAGNOSIS — M75122 Complete rotator cuff tear or rupture of left shoulder, not specified as traumatic: Secondary | ICD-10-CM | POA: Diagnosis present

## 2022-02-06 DIAGNOSIS — M75102 Unspecified rotator cuff tear or rupture of left shoulder, not specified as traumatic: Secondary | ICD-10-CM | POA: Diagnosis present

## 2022-02-06 DIAGNOSIS — Z818 Family history of other mental and behavioral disorders: Secondary | ICD-10-CM | POA: Diagnosis not present

## 2022-02-06 DIAGNOSIS — M11262 Other chondrocalcinosis, left knee: Secondary | ICD-10-CM | POA: Diagnosis present

## 2022-02-06 DIAGNOSIS — R569 Unspecified convulsions: Secondary | ICD-10-CM | POA: Diagnosis present

## 2022-02-06 DIAGNOSIS — G8194 Hemiplegia, unspecified affecting left nondominant side: Secondary | ICD-10-CM | POA: Diagnosis present

## 2022-02-06 DIAGNOSIS — N179 Acute kidney failure, unspecified: Secondary | ICD-10-CM

## 2022-02-06 DIAGNOSIS — Z8249 Family history of ischemic heart disease and other diseases of the circulatory system: Secondary | ICD-10-CM | POA: Diagnosis not present

## 2022-02-06 DIAGNOSIS — Z20822 Contact with and (suspected) exposure to covid-19: Secondary | ICD-10-CM | POA: Diagnosis present

## 2022-02-06 DIAGNOSIS — M25462 Effusion, left knee: Secondary | ICD-10-CM | POA: Diagnosis present

## 2022-02-06 DIAGNOSIS — E1122 Type 2 diabetes mellitus with diabetic chronic kidney disease: Secondary | ICD-10-CM | POA: Diagnosis present

## 2022-02-06 DIAGNOSIS — I129 Hypertensive chronic kidney disease with stage 1 through stage 4 chronic kidney disease, or unspecified chronic kidney disease: Secondary | ICD-10-CM | POA: Diagnosis present

## 2022-02-06 DIAGNOSIS — M1712 Unilateral primary osteoarthritis, left knee: Secondary | ICD-10-CM | POA: Diagnosis present

## 2022-02-06 LAB — BASIC METABOLIC PANEL
Anion gap: 11 (ref 5–15)
BUN: 17 mg/dL (ref 6–20)
CO2: 25 mmol/L (ref 22–32)
Calcium: 8.9 mg/dL (ref 8.9–10.3)
Chloride: 95 mmol/L — ABNORMAL LOW (ref 98–111)
Creatinine, Ser: 1.74 mg/dL — ABNORMAL HIGH (ref 0.61–1.24)
GFR, Estimated: 45 mL/min — ABNORMAL LOW (ref >60)
Glucose, Bld: 198 mg/dL — ABNORMAL HIGH (ref 70–99)
Potassium: 3.3 mmol/L — ABNORMAL LOW (ref 3.5–5.1)
Sodium: 131 mmol/L — ABNORMAL LOW (ref 135–145)

## 2022-02-06 LAB — GLUCOSE, CAPILLARY
Glucose-Capillary: 197 mg/dL — ABNORMAL HIGH (ref 70–99)
Glucose-Capillary: 243 mg/dL — ABNORMAL HIGH (ref 70–99)
Glucose-Capillary: 344 mg/dL — ABNORMAL HIGH (ref 70–99)

## 2022-02-06 LAB — TROPONIN I (HIGH SENSITIVITY)
Troponin I (High Sensitivity): 13 ng/L (ref <18)
Troponin I (High Sensitivity): 12 ng/L (ref <18)

## 2022-02-06 IMAGING — US US RENAL
1 series · 14 of 25 positions shown · non-contrast
Comparison: [DATE].

CLINICAL DATA: Acute kidney injury.

EXAM:
RENAL / URINARY TRACT ULTRASOUND COMPLETE

[Series 1: us renal · 14 of 42 slices shown]
[im 1/42]
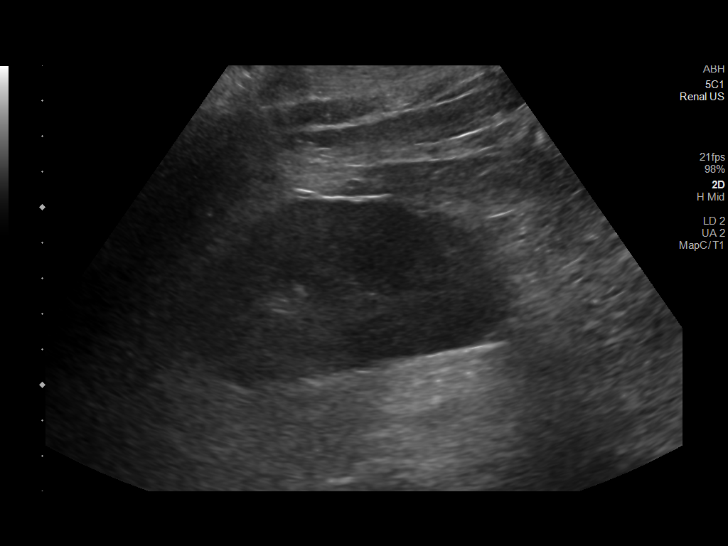
[im 4/42]
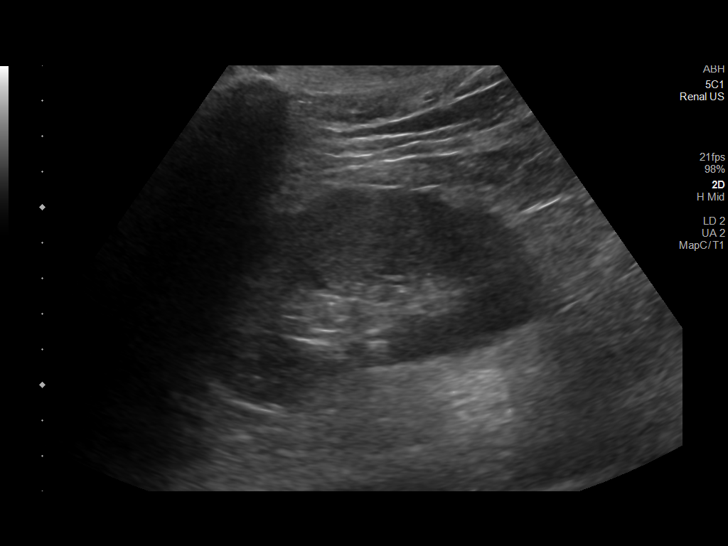
[im 7/42]
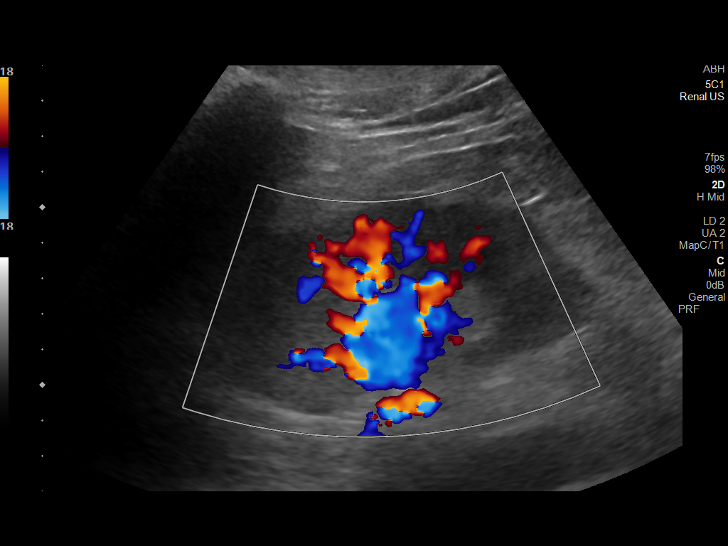
[im 11/42]
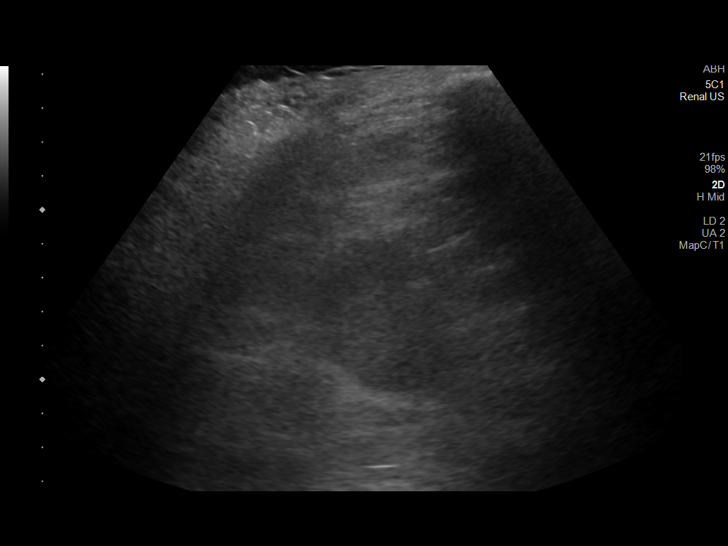
[im 14/42]
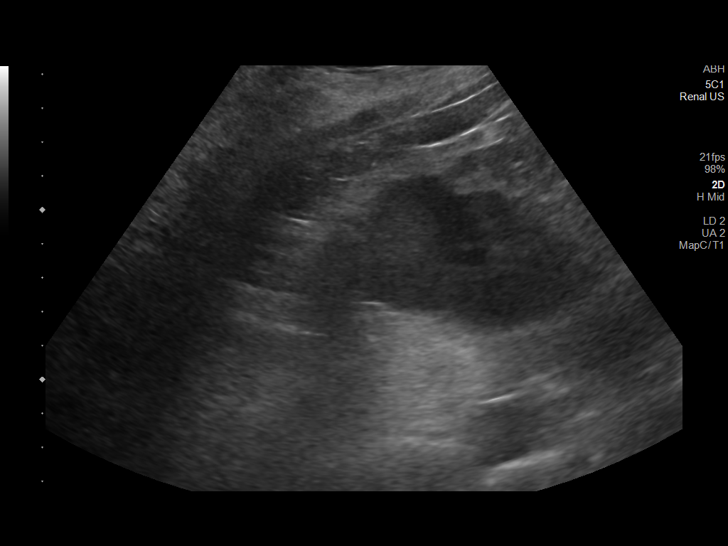
[im 16/42]
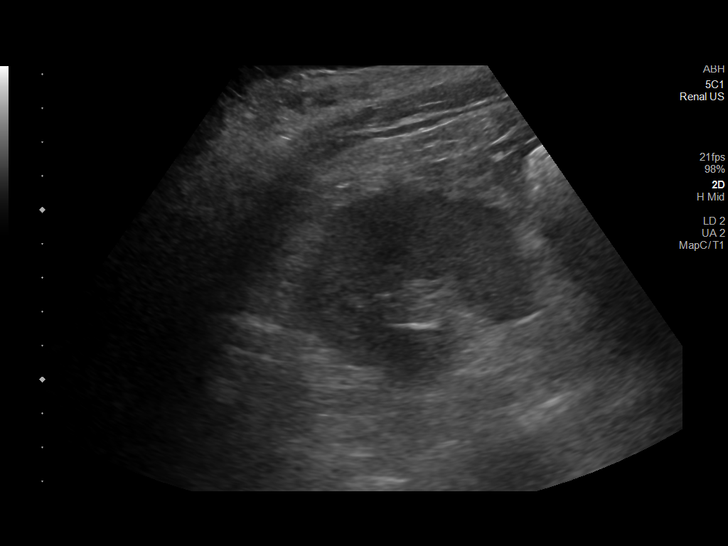
[im 19/42]
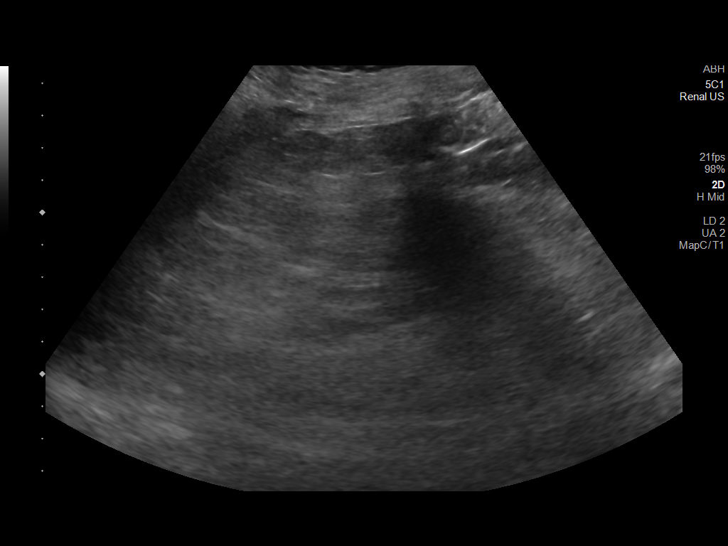
[im 23/42]
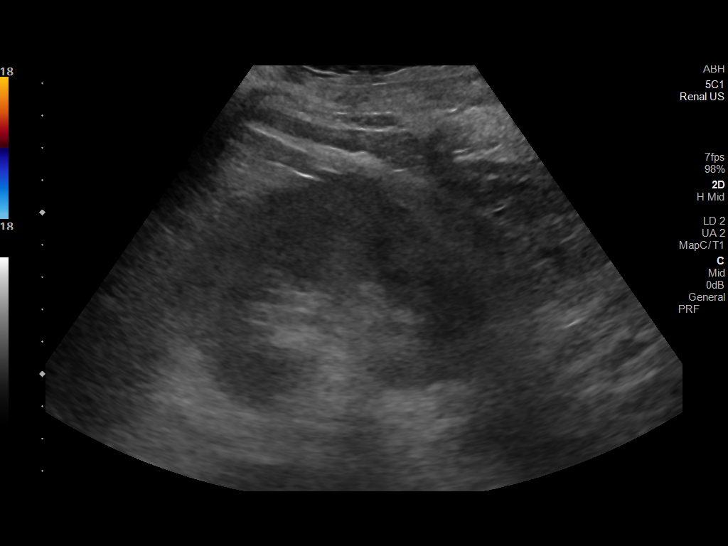
[im 26/42]
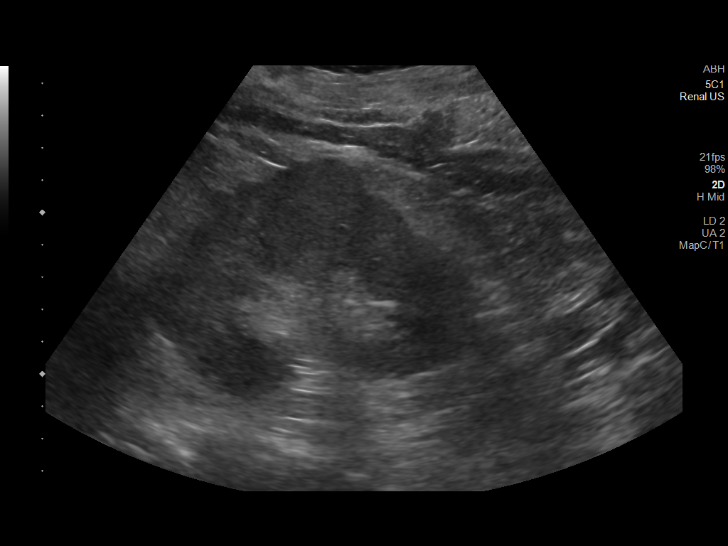
[im 28/42]
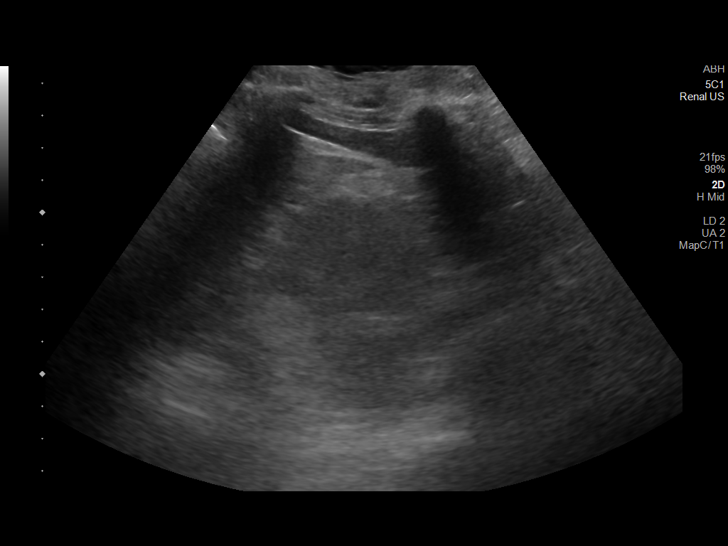
[im 31/42]
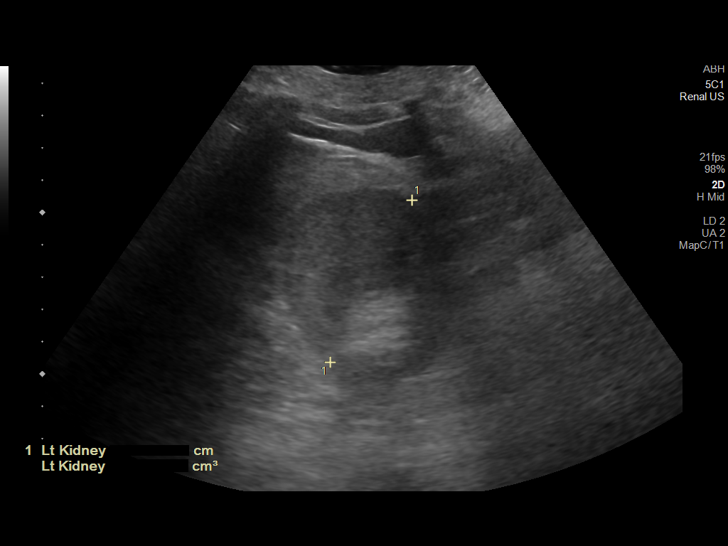
[im 35/42]
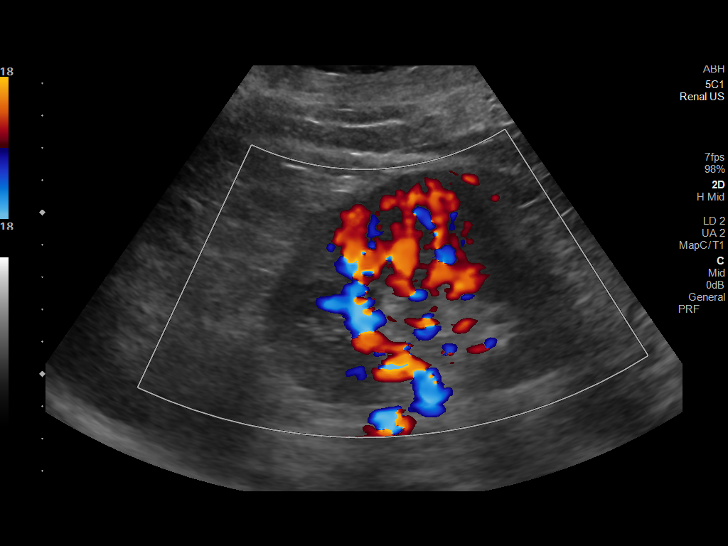
[im 38/42]
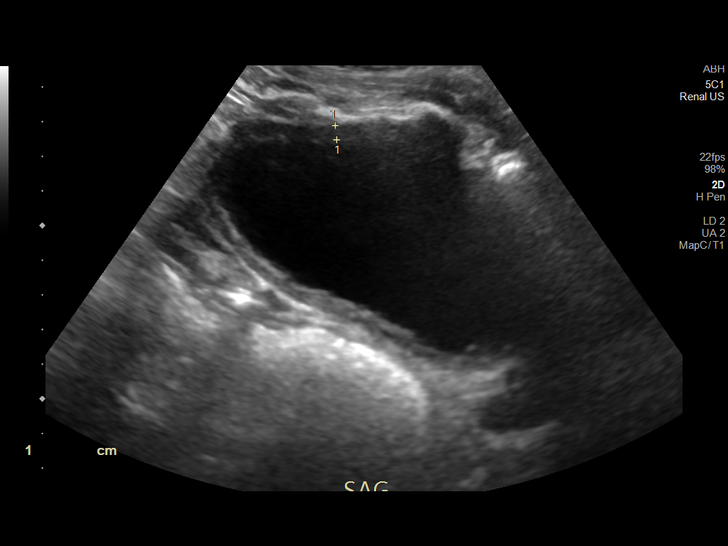
[im 42/42]
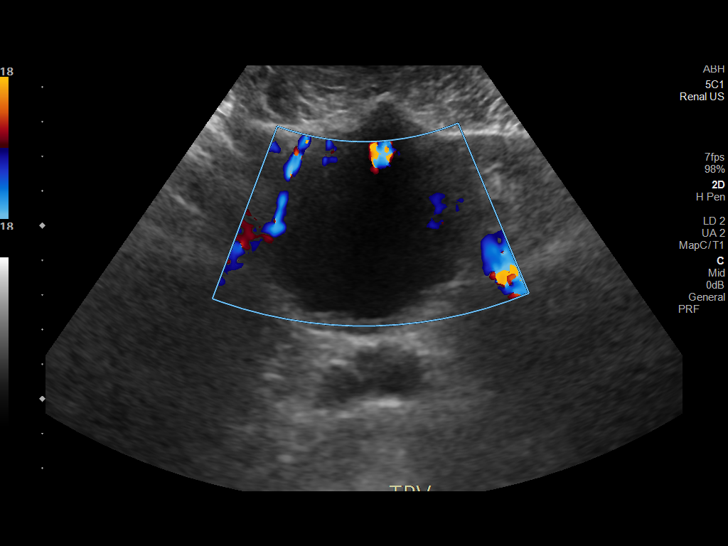

[14 of 25 positions shown; findings below may reference images not displayed]

FINDINGS: Right Kidney:

Renal measurements: 10.2 x 5.8 x 5.2 cm = volume: 160 mL. Increased
echogenicity of renal parenchyma is noted suggesting medical renal
disease. No mass or hydronephrosis visualized.

Left Kidney:

Renal measurements: 10.8 x 6.0 x 5.6 cm = volume: 191 mL. Increased
echogenicity of renal parenchyma is noted suggesting medical renal
disease. No mass or hydronephrosis visualized.

Bladder:

Appears normal for degree of bladder distention. Ureteral jets are
not visualized.

Other:

None.
IMPRESSION: Increased echogenicity of renal parenchyma is noted bilaterally
suggesting medical renal disease. No hydronephrosis or renal
obstruction is noted.

## 2022-02-06 MED ORDER — INSULIN ASPART 100 UNIT/ML IJ SOLN
0.0000 [IU] | Freq: Three times a day (TID) | INTRAMUSCULAR | Status: DC
  Administered 2022-02-07: 8 [IU] via SUBCUTANEOUS

## 2022-02-06 MED ORDER — PREDNISONE 5 MG PO TABS: 10.0000 mg | ORAL_TABLET | Freq: Every day | ORAL | Status: DC

## 2022-02-06 MED ORDER — PREDNISONE 5 MG PO TABS: 30.0000 mg | ORAL_TABLET | Freq: Every day | ORAL | Status: DC

## 2022-02-06 MED ORDER — PREDNISONE 20 MG PO TABS
40.0000 mg | ORAL_TABLET | Freq: Every day | ORAL | Status: DC
  Administered 2022-02-06 – 2022-02-07 (×2): 40 mg via ORAL
  Filled 2022-02-06 (×2): qty 2

## 2022-02-06 MED ORDER — ENOXAPARIN SODIUM 40 MG/0.4ML IJ SOSY
40.0000 mg | PREFILLED_SYRINGE | INTRAMUSCULAR | Status: DC
  Administered 2022-02-06: 40 mg via SUBCUTANEOUS
  Filled 2022-02-06 (×2): qty 0.4

## 2022-02-06 MED ORDER — POTASSIUM CHLORIDE 20 MEQ PO PACK
40.0000 meq | PACK | Freq: Two times a day (BID) | ORAL | Status: AC
  Administered 2022-02-06 (×2): 40 meq via ORAL
  Filled 2022-02-06 (×2): qty 2

## 2022-02-06 MED ORDER — DULOXETINE HCL 30 MG PO CPEP
30.0000 mg | ORAL_CAPSULE | Freq: Every day | ORAL | Status: DC
  Administered 2022-02-07: 30 mg via ORAL
  Filled 2022-02-06: qty 1

## 2022-02-06 MED ORDER — PREDNISONE 20 MG PO TABS: 20.0000 mg | ORAL_TABLET | Freq: Every day | ORAL | Status: DC

## 2022-02-06 MED ORDER — INSULIN ASPART 100 UNIT/ML IJ SOLN
0.0000 [IU] | Freq: Every day | INTRAMUSCULAR | Status: DC
  Administered 2022-02-06: 4 [IU] via SUBCUTANEOUS

## 2022-02-06 MED ORDER — NITROGLYCERIN 0.4 MG SL SUBL: 0.4000 mg | SUBLINGUAL_TABLET | SUBLINGUAL | Status: DC | PRN

## 2022-02-06 NOTE — TOC Initial Note (Signed)
Transition of Care (TOC) - Initial/Assessment Note  ? ? ?Patient Details  ?Name: Darryl Hurley ?MRN: [id number] ?Date of Birth: [birth year] ? ?Transition of Care (TOC) CM/SW Contact:    ?Kermit Balo, RN ?Phone Number: ?02/06/2022, 1:30 PM ? ?Clinical Narrative:                 ?Patient is from home with his girlfriend. Per patient she is able to provide supervision and some assistance.  ?Pt had a cane at home that he says her brought to the hospital but has gone missing since admission.  ?Girlfriend assists with medications at home.  ?Pt's brother provides needed transportation. ?Pt asking for new cane and shower seat for home.  ?PCP: [facility]--appt on the AVS. ?CIR to assess. CM following. ? ? ?Expected Discharge Plan: IP Rehab Facility ?Barriers to Discharge: Continued Medical Work up ? ? ?Patient Goals and CMS Choice ?  ?  ?Choice offered to / list presented to : Patient ? ?Expected Discharge Plan and Services ?Expected Discharge Plan: IP Rehab Facility ?  ?Discharge Planning Services: CM Consult ?Post Acute Care Choice: IP Rehab ?Living arrangements for the past 2 months: Apartment ?                ?  ?  ?  ?  ?  ?  ?  ?  ?  ?  ? ?Prior Living Arrangements/Services ?Living arrangements for the past 2 months: Apartment ?Lives with:: Significant Other ?Patient language and need for interpreter reviewed:: Yes ?Do you feel safe going back to the place where you live?: Yes      ?  ?Care giver support system in place?: Yes (comment) ?Current home services: DME (walker) ?Criminal Activity/Legal Involvement Pertinent to Current Situation/Hospitalization: No - Comment as needed ? ?Activities of Daily Living ?Home Assistive Devices/Equipment: Cane (specify quad or straight), Walker (specify type), CBG Meter ?ADL Screening (condition at time of admission) ?Patient's cognitive ability adequate to safely complete daily activities?: Yes ?Is the patient deaf or have difficulty hearing?: No ?Does the  patient have difficulty seeing, even when wearing glasses/contacts?: No ?Does the patient have difficulty concentrating, remembering, or making decisions?: No ?Patient able to express need for assistance with ADLs?: Yes ?Does the patient have difficulty dressing or bathing?: Yes ?Independently performs ADLs?: No ?Communication: Independent ?Dressing (OT): Needs assistance ?Is this a change from baseline?: Change from baseline, expected to last <3days ?Grooming: Needs assistance ?Is this a change from baseline?: Change from baseline, expected to last >3 days ?Feeding: Independent ?Bathing: Needs assistance ?Is this a change from baseline?: Change from baseline, expected to last >3 days ?In/Out Bed: Needs assistance ?Does the patient have difficulty walking or climbing stairs?: Yes ?Weakness of Legs: Left ?Weakness of Arms/Hands: Left ? ?Permission Sought/Granted ?  ?  ?   ?   ?   ?   ? ?Emotional Assessment ?Appearance:: Appears stated age ?Attitude/Demeanor/Rapport: Engaged ?Affect (typically observed): Accepting ?Orientation: : Oriented to Self, Oriented to Place, Oriented to  Time, Oriented to Situation ?  ?Psych Involvement: No (comment) ? ?Admission diagnosis:  Weakness [R53.1] ?Stroke [facility]) [I63.9] ?Left knee pain, unspecified chronicity [M25.562] ?Left shoulder pain, unspecified chronicity [M25.512] ?Rotator cuff tear arthropathy of left shoulder [M75.102, M12.812] ?Patient Active Problem List  ? Diagnosis Date Noted  ? Rotator cuff tear arthropathy of left shoulder 02/06/2022  ? Adhesive capsulitis of left shoulder 02/05/2022  ? Effusion of left knee 02/05/2022  ? Stroke [facility]) 02/04/2022  ?  Abnormal stress test 11/29/2021  ? Polyarthralgia 11/19/2019  ? Sepsis (HCC) 11/19/2019  ? Septic arthritis (HCC) 11/19/2019  ? 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 Hyponatremia 11/19/2019  ? Alcohol abuse 11/19/2019  ? Lactic acidosis 01/06/2016  ? Lactic acid acidosis 01/06/2016  ? Alcohol abuse with intoxication (HCC)  01/06/2016  ? COPD with acute exacerbation (HCC) 02/02/2014  ? HCAP (healthcare-associated pneumonia) 02/02/2014  ? Increased anion gap metabolic acidosis 02/02/2014  ? Cocaine abuse (HCC) 11/18/2013  ? Fever 11/17/2013  ? Anemia 11/17/2013  ? Hypokalemia 11/17/2013  ? Polyarthritis 11/17/2013  ? Dehydration 11/17/2013  ? Type 2 diabetes mellitus with diabetic polyneuropathy, without long-term current use of insulin (HCC) 05/14/2013  ? Chronic obstructive pulmonary disease (COPD) (HCC) 05/12/2013  ? Gout 05/12/2013  ? Headache 05/12/2013  ? Asthma 05/04/2013  ? TOBACCO ABUSE 04/27/2009  ? Primary hypertension 04/27/2009  ? Angina pectoris (HCC) 04/27/2009  ? ?PCP:  Pcp, No ?Pharmacy:   ?[facility] [place], [place] - [address] ?[phone]  [address] ?[place] [address] ?Phone: [phone] Fax: [phone] ? ? ? ? ?Social Determinants of Health (SDOH) Interventions ?  ? ?Readmission Risk Interventions ?No flowsheet data found. ? ? ?

## 2022-02-06 NOTE — Progress Notes (Signed)
Inpatient Rehab Admissions Coordinator:  ? ?At this time we are recommending a CIR consult and I will place an order per our protocol.   ? ?Estill Dooms, PT, DPT ?Admissions Coordinator ?[phone] ?02/06/22  ?11:55 AM ? ?

## 2022-02-06 NOTE — Consult Note (Signed)
Reason for Consult:Left shoulder/knee pain Referring Physician: Debe CoderEmily Mullen Time called: 1007 Time at bedside: 1056   Daine Florasnthony R Patti is an 61 y.o. male.  HPI: Ethelene Brownsnthony was admitted with left-sided weakness a couple of days ago. He ruled out for stroke. He notes left shoulder and knee pain for almost the past month (though reviewing notes he is quite inconsistent on this). Workup has shown rotator cuff pathology of unknown chronicity and knee arthrocentesis showed pseudogout and orthopedic surgery was consulted. He is RHD.  Past Medical History:  Diagnosis Date   Alcohol abuse    Anemia    Angina pectoris    Arthritis    Asthma    Chronic lower back pain    COPD (chronic obstructive pulmonary disease) (HCC)    on home O2 2L   Diabetes mellitus without complication (HCC)    DVT (deep venous thrombosis) (HCC)    bilateral   Exertional shortness of breath    Gout    Hypertension    Seizures (HCC)     Past Surgical History:  Procedure Laterality Date   INCISION AND DRAINAGE ABSCESS Bilateral 11/19/2019   Procedure: INCISION AND DRAINAGE BILATERAL SEPTIC KNEES, ASPIRATION OF LEFT KNEE;  Surgeon: Roby LoftsHaddix, Kevin P, MD;  Location: [facility] OR;  Service: Orthopedics;  Laterality: Bilateral;   INGUINAL HERNIA REPAIR Bilateral 09/01/2020   Procedure: BILATERAL LAPAROSCOPIC INGUINAL HERNIA REPAIR WITH MESH;  Surgeon: Abigail MiyamotoBlackman, Douglas, MD;  [facility];  Service: General;  Laterality: Bilateral;   INSERTION OF MESH Bilateral 09/01/2020   Procedure: INSERTION OF MESH;  Surgeon: Abigail MiyamotoBlackman, Douglas, MD;  Location: [facility] OR;  Service: General;  Laterality: Bilateral;   NO PAST SURGERIES      Family History  Problem Relation Age of Onset   Diabetes type II Mother    Depression Father    Suicidality Father    Asthma Brother     Social History:  reports that he does not have a smoking history on file. He has never used smokeless tobacco. He reports current alcohol use. He reports that he does not  use drugs.  Allergies:  Allergies  Allergen Reactions   Nsaids Other (See Comments)    Vagale down reaction   Toradol [Ketorolac Tromethamine]     vagale down reaction    Medications: I have reviewed the patient's current medications.  Results for orders placed or performed during the hospital encounter of 02/04/22 (from the past 48 hour(s))  Body fluid culture w Gram Stain     Status: None (Preliminary result)   Collection Time: 02/04/22 12:20 PM   Specimen: Synovium; Body Fluid  Result Value Ref Range   Specimen Description SYNOVIAL    Special Requests KNEE    Gram Stain      MODERATE WBC PRESENT, PREDOMINANTLY PMN NO ORGANISMS SEEN    Culture      NO GROWTH 2 DAYS Performed at [facility], [address] [place], [place] [address]    Report Status PENDING   Synovial cell count + diff, w/ crystals     Status: Abnormal   Collection Time: 02/04/22 12:21 PM  Result Value Ref Range   Color, Synovial YELLOW YELLOW   Appearance-Synovial CLOUDY (A) CLEAR   Crystals, Fluid CA PYROPHOS DIHYDRATE    WBC, Synovial 17,450 (H) 0 - 200 /cu mm   Neutrophil, Synovial 92 (H) 0 - 25 %   Lymphocytes-Synovial Fld 0 0 - 20 %   Monocyte-Macrophage-Synovial Fluid 8 (L) 50 -  90 %   Eosinophils-Synovial 0 0 - 1 %    Comment: Performed at [facility], [place] [address] [place], [place] [address]  Resp Panel by RT-PCR (Flu A&B, Covid) Nasopharyngeal Swab     Status: None   Collection Time: 02/04/22  2:40 PM   Specimen: Nasopharyngeal Swab; Nasopharyngeal(NP) swabs in vial transport medium  Result Value Ref Range   SARS Coronavirus 2 by RT PCR NEGATIVE NEGATIVE    Comment: (NOTE) SARS-CoV-2 target nucleic acids are NOT DETECTED.  The SARS-CoV-2 RNA is generally detectable in upper respiratory specimens during the acute phase of infection. The lowest concentration of SARS-CoV-2 viral copies this assay can detect is 138 copies/mL. A negative result does not preclude  SARS-Cov-2 infection and should not be used as the sole basis for treatment or other patient management decisions. A negative result may occur with  improper specimen collection/handling, submission of specimen other than nasopharyngeal swab, presence of viral mutation(s) within the areas targeted by this assay, and inadequate number of viral copies(<138 copies/mL). A negative result must be combined with clinical observations, patient history, and epidemiological information. The expected result is Negative.  Fact Sheet for Patients:  EntrepreneurPulse.com.au  Fact Sheet for Healthcare Providers:  IncredibleEmployment.be  This test is no t yet approved or cleared by the [place] FDA and  has been authorized for detection and/or diagnosis of SARS-CoV-2 by FDA under an Emergency Use Authorization (EUA). This EUA will remain  in effect (meaning this test can be used) for the duration of the COVID-19 declaration under Section 564(b)(1) of the Act, 21 U.S.C.section 360bbb-3(b)(1), unless the authorization is terminated  or revoked sooner.       Influenza A by PCR NEGATIVE NEGATIVE   Influenza B by PCR NEGATIVE NEGATIVE    Comment: (NOTE) The Xpert Xpress SARS-CoV-2/FLU/RSV plus assay is intended as an aid in the diagnosis of influenza from Nasopharyngeal swab specimens and should not be used as a sole basis for treatment. Nasal washings and aspirates are unacceptable for Xpert Xpress SARS-CoV-2/FLU/RSV testing.  Fact Sheet for Patients: EntrepreneurPulse.com.au  Fact Sheet for Healthcare Providers: IncredibleEmployment.be  This test is not yet approved or cleared by the [place] FDA and has been authorized for detection and/or diagnosis of SARS-CoV-2 by FDA under an Emergency Use Authorization (EUA). This EUA will remain in effect (meaning this test can be used) for the duration of the COVID-19  declaration under Section 564(b)(1) of the Act, 21 U.S.C. section 360bbb-3(b)(1), unless the authorization is terminated or revoked.  Performed at [facility], [place] [address] [place], [place] [address]   Glucose, capillary     Status: Abnormal   Collection Time: 02/04/22  9:23 PM  Result Value Ref Range   Glucose-Capillary 202 (H) 70 - 99 mg/dL    Comment: Glucose reference range applies only to samples taken after fasting for at least 8 hours.  Hemoglobin A1c     Status: Abnormal   Collection Time: 02/05/22  3:37 AM  Result Value Ref Range   Hgb A1c MFr Bld 6.8 (H) 4.8 - 5.6 %    Comment: (NOTE) Pre diabetes:          5.7%-6.4%  Diabetes:              >6.4%  Glycemic control for   <7.0% adults with diabetes    Mean Plasma Glucose 148.46 mg/dL    Comment: Performed at [facility] [place],  [place] [address]  Lipid panel     Status: None   Collection Time: 02/05/22  3:37 AM  Result Value Ref Range   Cholesterol 146 0 - 200 mg/dL   Triglycerides 102 <150 mg/dL   HDL 79 >40 mg/dL   Total CHOL/HDL Ratio 1.8 RATIO   VLDL 20 0 - 40 mg/dL   LDL Cholesterol 47 0 - 99 mg/dL    Comment:        Total Cholesterol/HDL:CHD Risk Coronary Heart Disease Risk Table                     Men   Women  1/2 Average Risk   3.4   3.3  Average Risk       5.0   4.4  2 X Average Risk   9.6   7.1  3 X Average Risk  23.4   11.0        Use the calculated Patient Ratio above and the CHD Risk Table to determine the patient's CHD Risk.        ATP III CLASSIFICATION (LDL):  <100     mg/dL   Optimal  100-129  mg/dL   Near or Above                    Optimal  130-159  mg/dL   Borderline  160-189  mg/dL   High  >190     mg/dL   Very High Performed at [facility] [address], [place], [place] [address]   TSH     Status: Abnormal   Collection Time: 02/05/22  3:37 AM  Result Value Ref Range   TSH 5.515 (H) 0.350 - 4.500 uIU/mL    Comment: Performed by a  3rd Generation assay with a functional sensitivity of <=0.01 uIU/mL. Performed at [facility], [place] [address], [place], [place] [address]   HIV Antibody (routine testing w rflx)     Status: None   Collection Time: 02/05/22  3:37 AM  Result Value Ref Range   HIV Screen 4th Generation wRfx Non Reactive Non Reactive    Comment: Performed at [facility], [place] [address], [place], [place] [id number]  Basic metabolic panel     Status: Abnormal   Collection Time: 02/05/22  3:37 AM  Result Value Ref Range   Sodium 134 (L) 135 - 145 mmol/L   Potassium 3.5 3.5 - 5.1 mmol/L   Chloride 98 98 - 111 mmol/L   CO2 25 22 - 32 mmol/L   Glucose, Bld 184 (H) 70 - 99 mg/dL    Comment: Glucose reference range applies only to samples taken after fasting for at least 8 hours.   BUN 13 6 - 20 mg/dL   Creatinine, Ser 1.28 (H) 0.61 - 1.24 mg/dL   Calcium 9.2 8.9 - 10.3 mg/dL   GFR, Estimated >60 >60 mL/min    Comment: (NOTE) Calculated using the CKD-EPI Creatinine Equation (2021)    Anion gap 11 5 - 15    Comment: Performed at [facility] [address], [place] [address]  CBC     Status: Abnormal   Collection Time: 02/05/22  3:37 AM  Result Value Ref Range   WBC 10.4 4.0 - 10.5 K/uL   RBC 4.32 4.22 - 5.81 MIL/uL   Hemoglobin 13.2 13.0 - 17.0 g/dL   HCT 39.4 39.0 - 52.0 %   MCV 91.2 80.0 - 100.0  fL   MCH 30.6 26.0 - 34.0 pg   MCHC 33.5 30.0 - 36.0 g/dL   RDW 15.7 (H) 11.5 - 15.5 %   Platelets 249 150 - 400 K/uL   nRBC 0.0 0.0 - 0.2 %    Comment: Performed at [facility] [address] [place], [place] [address]  Glucose, capillary     Status: Abnormal   Collection Time: 02/05/22  6:13 AM  Result Value Ref Range   Glucose-Capillary 213 (H) 70 - 99 mg/dL    Comment: Glucose reference range applies only to samples taken after fasting for at least 8 hours.  Glucose, capillary     Status: Abnormal   Collection Time: 02/05/22 11:41 AM  Result Value Ref Range    Glucose-Capillary 254 (H) 70 - 99 mg/dL    Comment: Glucose reference range applies only to samples taken after fasting for at least 8 hours.  Glucose, capillary     Status: Abnormal   Collection Time: 02/05/22  4:27 PM  Result Value Ref Range   Glucose-Capillary 214 (H) 70 - 99 mg/dL    Comment: Glucose reference range applies only to samples taken after fasting for at least 8 hours.   Comment 1 Notify RN    Comment 2 Document in Chart   Glucose, capillary     Status: Abnormal   Collection Time: 02/05/22  9:22 PM  Result Value Ref Range   Glucose-Capillary 175 (H) 70 - 99 mg/dL    Comment: Glucose reference range applies only to samples taken after fasting for at least 8 hours.  Basic metabolic panel     Status: Abnormal   Collection Time: 02/06/22  1:06 AM  Result Value Ref Range   Sodium 131 (L) 135 - 145 mmol/L   Potassium 3.3 (L) 3.5 - 5.1 mmol/L   Chloride 95 (L) 98 - 111 mmol/L   CO2 25 22 - 32 mmol/L   Glucose, Bld 198 (H) 70 - 99 mg/dL    Comment: Glucose reference range applies only to samples taken after fasting for at least 8 hours.   BUN 17 6 - 20 mg/dL   Creatinine, Ser 1.74 (H) 0.61 - 1.24 mg/dL   Calcium 8.9 8.9 - 10.3 mg/dL   GFR, Estimated 45 (L) >60 mL/min    Comment: (NOTE) Calculated using the CKD-EPI Creatinine Equation (2021)    Anion gap 11 5 - 15    Comment: Performed at [facility] [address], [place], [place] [address]  Glucose, capillary     Status: Abnormal   Collection Time: 02/06/22  6:33 AM  Result Value Ref Range   Glucose-Capillary 197 (H) 70 - 99 mg/dL    Comment: Glucose reference range applies only to samples taken after fasting for at least 8 hours.   Comment 1 Notify RN    Comment 2 Document in Chart   Troponin I (High Sensitivity)     Status: None   Collection Time: 02/06/22  7:19 AM  Result Value Ref Range   Troponin I (High Sensitivity) 13 <18 ng/L    Comment: (NOTE) Elevated high sensitivity troponin I (hsTnI) values  and significant  changes across serial measurements may suggest ACS but many other  chronic and acute conditions are known to elevate hsTnI results.  Refer to the "Links" section for chest pain algorithms and additional  guidance. Performed at [facility], [place] [address], [place], [place] [address]   Troponin I (High  Sensitivity)     Status: None   Collection Time: 02/06/22  9:18 AM  Result Value Ref Range   Troponin I (High Sensitivity) 12 <18 ng/L    Comment: (NOTE) Elevated high sensitivity troponin I (hsTnI) values and significant  changes across serial measurements may suggest ACS but many other  chronic and acute conditions are known to elevate hsTnI results.  Refer to the "Links" section for chest pain algorithms and additional  guidance. Performed at [facility], [place] [address] [place], [place] [address]     CT ANGIO HEAD NECK W WO CM  Result Date: 02/04/2022 CLINICAL DATA:  Provided history: Neuro deficit, acute, stroke suspected. Additional history provided: Patient reports 6 days of left-sided weakness and slurred speech. EXAM: CT ANGIOGRAPHY HEAD AND NECK TECHNIQUE: Multidetector CT imaging of the head and neck was performed using the standard protocol during bolus administration of intravenous contrast. Multiplanar CT image reconstructions and MIPs were obtained to evaluate the vascular anatomy. Carotid stenosis measurements (when applicable) are obtained utilizing NASCET criteria, using the distal internal carotid diameter as the denominator. RADIATION DOSE REDUCTION: This exam was performed according to the departmental dose-optimization program which includes automated exposure control, adjustment of the mA and/or kV according to patient size and/or use of iterative reconstruction technique. CONTRAST:  48mL OMNIPAQUE IOHEXOL 350 MG/ML SOLN COMPARISON:  Prior head CT examinations 12/25/2018 and earlier. FINDINGS: CT HEAD FINDINGS Brain: Mild generalized  parenchymal atrophy. Mild patchy and ill-defined hypoattenuation within the cerebral white matter, nonspecific but compatible with chronic small vessel ischemic disease. There is no acute intracranial hemorrhage. No demarcated cortical infarct. No extra-axial fluid collection. No evidence of an intracranial mass. No midline shift. Vascular: No hyperdense vessel.  Atherosclerotic calcifications. Skull: Normal. Negative for fracture or focal lesion. Sinuses: Moderate mucosal thickening within the left maxillary sinus. Small mucous retention cysts within the right maxillary sinus. Mild mucosal thickening within the bilateral frontal sinuses. Orbits: No orbital mass or acute orbital finding. Bilateral proptosis. Chronic medially displaced fracture deformity of the left lamina papyracea. Review of the MIP images confirms the above findings CTA NECK FINDINGS Aortic arch: Standard aortic branching. Atherosclerotic plaque within the visualized aortic arch and proximal major branch vessels of the neck. Streak and beam hardening artifact arising from a dense left-sided contrast bolus partially obscures the left subclavian artery. Within this limitation, there is no appreciable innominate or proximal subclavian artery stenosis. Right carotid system: CCA and ICA patent within the neck without significant stenosis (50% or greater). Moderate soft and calcified plaque about the carotid bifurcation and within the proximal ICA. Left carotid system: CCA and ICA patent within the neck without significant stenosis (50% or greater). Mild soft and calcified plaque within the CCA, about the carotid bifurcation and within the proximal ICA. Vertebral arteries: Vertebral arteries codominant and patent within the neck without hemodynamically significant stenosis. Mild calcified plaque within the proximal V2 left vertebral artery. Skeleton: Cervical spondylosis. Fusion across the C6-C7 disc space. Reversal of the expected cervical lordosis. No  acute bony abnormality or aggressive osseous lesion. Other neck: No neck mass or cervical lymphadenopathy. Upper chest: No consolidation within the imaged lung apices. Review of the MIP images confirms the above findings CTA HEAD FINDINGS Anterior circulation: The intracranial internal carotid arteries are patent. Moderate stenosis at the right petrous-cavernous junction. Apparent moderate-to-severe stenosis within the cavernous left ICA (series 15, image 115). The M1 middle cerebral arteries are patent. No M2 proximal branch occlusion or high-grade proximal stenosis  is identified. The anterior cerebral arteries are patent. Severe stenosis within the left ACA at the A2/A3 junction. No intracranial aneurysm is identified. 1-2 mm in thickness-vascular protrusion arising from the paraclinoid left ICA, which may reflect an infundibulum or small aneurysm (series 15, image 113). Posterior circulation: The intracranial vertebral arteries are patent. Plaque within the right vertebral artery at the level of the skull base resulting in at least moderate stenosis. The basilar artery is patent. The posterior cerebral arteries are patent. Fetal origin right PCA. Severe stenosis within a right PCA branch at the P2/P3 junction. The left posterior communicating artery is small or absent. Venous sinuses: Within the limitations of contrast timing, no convincing thrombus. Anatomic variants: As described. Review of the MIP images confirms the above findings IMPRESSION: CT head: 1. No evidence of acute intracranial abnormality. 2. Mild chronic small vessel ischemic changes within the cerebral white matter. 3. Mild generalized parenchymal atrophy. 4. Bilateral proptosis. 5. Paranasal sinus disease, as described. CTA neck: 1. The common carotid, internal carotid and vertebral arteries are patent within the neck without hemodynamically significant stenosis. Atherosclerotic plaque within the common carotid, internal carotid and left  vertebral arteries as described. 2. Cervical spondylosis. Fusion across the C6-C7 disc space anteriorly. 3. Nonspecific reversal of the expected cervical lordosis. CTA head: 1. No intracranial large vessel occlusion is identified. 2. Intracranial atherosclerotic disease with multifocal stenoses, most notably as follows. 3. Moderate stenosis within the right ICA at the petrous-cavernous junction. 4. Apparent moderate to severe stenosis within the cavernous left ICA. 5. Severe stenosis within the left anterior cerebral artery at the A2/A3 junction. 6. Calcified plaque within the right vertebral artery at the level of the skull base resulting in at least moderate stenosis. 7. Severe stenosis within a right PCA branch at the P2/P3 junction. 8. 1-2 mm inferiorly projecting vascular protrusion arising from the paraclinoid left ICA, which may reflect an infundibulum or small aneurysm. Electronically Signed   By: Jackey Loge D.O.   On: 02/04/2022 11:57   MR BRAIN WO CONTRAST  Result Date: 02/04/2022 CLINICAL DATA:  Neuro deficit, acute, stroke suspected. EXAM: MRI HEAD WITHOUT CONTRAST TECHNIQUE: Multiplanar, multiecho pulse sequences of the brain and surrounding structures were obtained without intravenous contrast. COMPARISON:  Noncontrast head CT and CT angiogram head/neck performed earlier today 02/04/2022. Prior head CT examinations 12/25/2018 and earlier FINDINGS: Moderate motion degradation of the coronal T2 TSE sequence. No more than mild motion degradation of the remaining cord sequences. Brain: Mild generalized cerebral and cerebellar atrophy. Moderate multifocal T2 FLAIR hyperintense signal abnormality within the cerebral white matter, nonspecific but compatible with chronic small vessel ischemic disease. Punctate chronic microhemorrhages within the right thalamus and within the pons. There is no acute infarct. No evidence of an intracranial mass. No extra-axial fluid collection. No midline shift. Vascular:  Maintained flow voids within the proximal large arterial vessels. Skull and upper cervical spine: No focal suspicious marrow lesion. Sinuses/Orbits: Visualized orbits show no acute finding. Moderate mucosal thickening within the left maxillary sinus. Small mucous retention cyst within the right maxillary sinus. Mild mucosal thickening within the left sphenoid sinus. Mild mucosal thickening within the bilateral ethmoid and frontal sinuses. Chronic medially displaced fracture deformity of the left lamina papyracea. IMPRESSION: No evidence of acute intracranial abnormality. Moderate chronic small vessel ischemic changes within the cerebral white matter. Mild generalized parenchymal atrophy. Paranasal sinus disease, as described. Electronically Signed   By: Jackey Loge D.O.   On: 02/04/2022 16:32   MR CERVICAL  SPINE WO CONTRAST  Result Date: 02/05/2022 CLINICAL DATA:  Chronic neck pain EXAM: MRI CERVICAL SPINE WITHOUT CONTRAST TECHNIQUE: Multiplanar, multisequence MR imaging of the cervical spine was performed. No intravenous contrast was administered. COMPARISON:  No prior MRI, correlation is made with CT cervical spine 09/09/2021 FINDINGS: Evaluation is somewhat limited by motion artifact. Alignment: Straightening and mild reversal of the normal cervical lordosis. No significant listhesis. Vertebrae: No acute fracture or suspicious osseous lesion. Degenerative fusion of the anterior aspects of C6 and C7, which is better seen on the prior CT. Congenitally short pedicles, which narrow the AP diameter of the spinal canal. Cord: Normal signal and morphology. Posterior Fossa, vertebral arteries, paraspinal tissues: Negative. Disc levels: C2-C3: No significant disc bulge. Facet and uncovertebral hypertrophy. No spinal canal stenosis. Mild left neural foraminal narrowing. C3-C4: Broad-based disc bulge. Uncovertebral and facet arthropathy. Mild-to-moderate spinal canal stenosis. Mild-to-moderate left neural foraminal  narrowing. C4-C5: Mild disc bulge. Facet and uncovertebral hypertrophy. Mild spinal canal stenosis. Mild left and moderate right neural foraminal narrowing. C5-C6: Mild disc bulge. Uncovertebral and facet arthropathy. No spinal canal stenosis. Mild left neural foraminal narrowing. C6-C7: No significant disc bulge. Facet and uncovertebral hypertrophy. Ligamentum flavum hypertrophy. Mild spinal canal stenosis. Moderate left-greater-than-right neural foraminal narrowing. C7-T1: Mild disc bulge. Ligamentum flavum hypertrophy. Facet and uncovertebral hypertrophy. Mild spinal canal stenosis. Severe left-greater-than-right neural foraminal narrowing. IMPRESSION: 1. Evaluation is somewhat limited by motion artifact. Within this limitation, congenitally short pedicles and superimposed degenerative changes, which cause mild-to-moderate spinal canal stenosis at C3-C4 and mild spinal canal stenosis at C4-C5, C6-C7, and C7-T1. No definite cord signal abnormality. 2. Multilevel uncovertebral and facet arthropathy, which overall left-greater-than-right and is worst at C7-T1, where it is severe. There is at least mild left neural foraminal narrowing at every level in the cervical spine, described in detail above. Electronically Signed   By: Merilyn Baba M.D.   On: 02/05/2022 00:47   MR Shoulder Left W Wo Contrast  Result Date: 02/06/2022 CLINICAL DATA:  Evaluate for labral tear.  Shoulder pain. EXAM: MRI OF THE LEFT SHOULDER WITHOUT AND WITH CONTRAST TECHNIQUE: Multiplanar, multisequence MR imaging of the LEFT shoulder was performed before and after the administration of intravenous contrast. CONTRAST:  25mL GADAVIST GADOBUTROL 1 MMOL/ML IV SOLN COMPARISON:  None. FINDINGS: Severe patient motion degrades image quality limiting evaluation. Rotator cuff: Complete tear of the supraspinatus tendon with 2.8 cm of retraction. Small full-thickness tear of the anterior aspect of the infraspinatus tendon. Teres minor tendon is intact.  Moderate tendinosis of the subscapularis tendon with a partial-thickness tear. Muscles: No muscle atrophy or edema. No intramuscular fluid collection or hematoma. Biceps Long Head: Moderate tendinosis of the intra-articular portion of the long head of the biceps tendon. Medial dislocation of the proximal extra-articular portion of the long head of the biceps tendon. Acromioclavicular Joint: Moderate arthropathy of the acromioclavicular joint. Small amount of subacromial/subdeltoid bursal fluid. Glenohumeral Joint: Moderate joint effusion with synovitis. Generalized chondral thinning. Labrum: Grossly intact, but evaluation is limited by lack of intraarticular fluid/contrast. Bones: No fracture or dislocation. No aggressive osseous lesion. Other: No fluid collection or hematoma. IMPRESSION: 1. Severe patient motion degrades image quality limiting evaluation. 2. Complete tear of the supraspinatus tendon with 2.8 cm of retraction. 3. Small full-thickness tear of the anterior aspect of the infraspinatus tendon. 4. Moderate tendinosis of the subscapularis tendon with a partial-thickness tear. 5. Moderate tendinosis of the intra-articular portion of the long head of the biceps tendon. Medial dislocation of the  proximal extra-articular portion of the long head of the biceps tendon. 6. Moderate joint effusion with synovitis. Electronically Signed   By: Kathreen Devoid M.D.   On: 02/06/2022 06:36   DG Hand Complete Left  Result Date: 02/05/2022 CLINICAL DATA:  Pain EXAM: LEFT HAND - COMPLETE 3+ VIEW COMPARISON:  None. FINDINGS: No recent fracture or dislocation is seen. Degenerative changes are noted in multiple carpometacarpal, metacarpophalangeal and interphalangeal joints. Degenerative changes are particularly severe in the first metacarpophalangeal joint, distal interphalangeal joint of index finger, PIP joint of index finger and PIP joint of ring finger. There are subcortical cysts in multiple phalanges and head of the  first metacarpal, most likely due to degenerative arthritis. IMPRESSION: No recent fracture or dislocation is seen in the left hand. Degenerative changes are noted in multiple joints as described in the body of the report. Electronically Signed   By: Elmer Picker M.D.   On: 02/05/2022 12:49   DG Hand Complete Right  Result Date: 02/05/2022 CLINICAL DATA:  Pain, arthritis EXAM: RIGHT HAND - COMPLETE 3+ VIEW COMPARISON:  None. FINDINGS: No recent fracture or dislocation is seen. Degenerative changes are noted in the multiple carpometacarpal, metacarpophalangeal and interphalangeal joints. Degenerative changes are also noted in the the right wrist. Degenerative changes are particularly severe in the fifth carpometacarpal joint and PIP and DIP joints of fifth finger. IMPRESSION: No recent fracture or dislocation is seen. Degenerative changes are noted in multiple joints as described in the body of the report. Electronically Signed   By: Elmer Picker M.D.   On: 02/05/2022 12:51   ECHOCARDIOGRAM COMPLETE BUBBLE STUDY  Result Date: 02/05/2022    ECHOCARDIOGRAM REPORT   Patient Name:   ORVIL MONTAGUE Date of Exam: 02/05/2022 Medical Rec #:  [id number]       Height:       69.0 in Accession #:    [id number]      Weight:       180.0 lb Date of Birth:  [birth year]       BSA:          1.976 m Patient Age:    41 years        BP:           174/103 mmHg Patient Gender: M               HR:           127 bpm. Exam Location:  Inpatient Procedure: 2D Echo, Cardiac Doppler, Color Doppler, 3D Echo, Strain Analysis and            Saline Contrast Bubble Study Indications:    CVA  History:        Patient has prior history of Echocardiogram examinations, most                 recent 03/30/2021. COPD, Signs/Symptoms:Shortness of Breath; Risk                 Factors:Hypertension and Diabetes.  Sonographer:    Luisa Hart RDCS Referring Phys: [place]  1. Left ventricular ejection fraction, by estimation,  is 55 to 60%. The left ventricle has normal function. The left ventricle demonstrates regional wall motion abnormalities with basal inferior hypokinesis. There is mild left ventricular hypertrophy. Left ventricular diastolic parameters are consistent with Grade I diastolic dysfunction (impaired relaxation).  2. Right ventricular systolic function is normal. The right ventricular size is normal. There is normal pulmonary artery systolic  pressure. The estimated right ventricular systolic pressure is 0000000 mmHg.  3. The aortic valve is tricuspid. Aortic valve regurgitation is not visualized. No aortic stenosis is present.  4. The mitral valve is normal in structure. No evidence of mitral valve regurgitation. No evidence of mitral stenosis.  5. The inferior vena cava is normal in size with greater than 50% respiratory variability, suggesting right atrial pressure of 3 mmHg.  6. Negative bubble study, no evidence for PFO or ASD. FINDINGS  Left Ventricle: Left ventricular ejection fraction, by estimation, is 55 to 60%. The left ventricle has normal function. The left ventricle demonstrates regional wall motion abnormalities. The left ventricular internal cavity size was normal in size. There is mild left ventricular hypertrophy. Left ventricular diastolic parameters are consistent with Grade I diastolic dysfunction (impaired relaxation). Right Ventricle: The right ventricular size is normal. No increase in right ventricular wall thickness. Right ventricular systolic function is normal. There is normal pulmonary artery systolic pressure. The tricuspid regurgitant velocity is 2.06 m/s, and  with an assumed right atrial pressure of 3 mmHg, the estimated right ventricular systolic pressure is 0000000 mmHg. Left Atrium: Left atrial size was normal in size. Right Atrium: Right atrial size was normal in size. Pericardium: There is no evidence of pericardial effusion. Mitral Valve: The mitral valve is normal in structure. No evidence  of mitral valve regurgitation. No evidence of mitral valve stenosis. MV peak gradient, 3.7 mmHg. The mean mitral valve gradient is 1.0 mmHg. Tricuspid Valve: The tricuspid valve is normal in structure. Tricuspid valve regurgitation is trivial. Aortic Valve: The aortic valve is tricuspid. Aortic valve regurgitation is not visualized. No aortic stenosis is present. Aortic valve mean gradient measures 3.0 mmHg. Aortic valve peak gradient measures 5.4 mmHg. Aortic valve area, by VTI measures 3.24 cm. Pulmonic Valve: The pulmonic valve was normal in structure. Pulmonic valve regurgitation is not visualized. Aorta: The aortic root is normal in size and structure. Venous: The inferior vena cava is normal in size with greater than 50% respiratory variability, suggesting right atrial pressure of 3 mmHg. IAS/Shunts: Negative bubble study, no evidence for PFO or ASD. Agitated saline contrast was given intravenously to evaluate for intracardiac shunting.  LEFT VENTRICLE PLAX 2D LVIDd:         3.90 cm      Diastology LVIDs:         3.00 cm      LV e' medial:    4.46 cm/s LV PW:         1.40 cm      LV E/e' medial:  10.7 LV IVS:        1.60 cm      LV e' lateral:   6.48 cm/s LVOT diam:     2.20 cm      LV E/e' lateral: 7.4 LV SV:         55 LV SV Index:   28 LVOT Area:     3.80 cm  LV Volumes (MOD) LV vol d, MOD A2C: 31.0 ml LV vol d, MOD A4C: 112.0 ml LV vol s, MOD A2C: 30.6 ml LV vol s, MOD A4C: 37.2 ml LV SV MOD A2C:     0.4 ml LV SV MOD A4C:     112.0 ml LV SV MOD BP:      25.2 ml RIGHT VENTRICLE RV Basal diam:  4.20 cm RV Mid diam:    2.50 cm RV S prime:     22.90 cm/s TAPSE (M-mode):  2.4 cm LEFT ATRIUM             Index LA diam:        3.40 cm 1.72 cm/m LA Vol (A2C):   38.7 ml 19.59 ml/m LA Vol (A4C):   24.2 ml 12.25 ml/m LA Biplane Vol: 31.3 ml 15.84 ml/m  AORTIC VALVE AV Area (Vmax):    2.84 cm AV Area (Vmean):   2.68 cm AV Area (VTI):     3.24 cm AV Vmax:           116.00 cm/s AV Vmean:          78.800 cm/s AV  VTI:            0.170 m AV Peak Grad:      5.4 mmHg AV Mean Grad:      3.0 mmHg LVOT Vmax:         86.70 cm/s LVOT Vmean:        55.500 cm/s LVOT VTI:          0.145 m LVOT/AV VTI ratio: 0.85  AORTA Ao Root diam: 2.90 cm Ao Asc diam:  3.40 cm MITRAL VALVE               TRICUSPID VALVE MV Area (PHT): 4.93 cm    TR Peak grad:   17.0 mmHg MV Area VTI:   3.63 cm    TR Vmax:        206.00 cm/s MV Peak grad:  3.7 mmHg MV Mean grad:  1.0 mmHg    SHUNTS MV Vmax:       0.96 m/s    Systemic VTI:  0.14 m MV Vmean:      52.4 cm/s   Systemic Diam: 2.20 cm MV Decel Time: 154 msec MV E velocity: 47.85 cm/s MV A velocity: 89.20 cm/s MV E/A ratio:  0.54 Dalton McleanMD Electronically signed by Wilfred Lacy Signature Date/Time: 02/05/2022/3:36:38 PM    Final     Review of Systems  HENT:  Negative for ear discharge, ear pain, hearing loss and tinnitus.   Eyes:  Negative for photophobia and pain.  Respiratory:  Negative for cough and shortness of breath.   Cardiovascular:  Negative for chest pain.  Gastrointestinal:  Negative for abdominal pain, nausea and vomiting.  Genitourinary:  Negative for dysuria, flank pain, frequency and urgency.  Musculoskeletal:  Positive for arthralgias (Left shoulder, left knee). Negative for back pain, myalgias and neck pain.  Neurological:  Positive for weakness (LUE). Negative for dizziness and headaches.  Hematological:  Does not bruise/bleed easily.  Psychiatric/Behavioral:  The patient is not nervous/anxious.   Blood pressure 130/78, pulse (!) 102, temperature 98.6 F (37 C), resp. rate 18, SpO2 96 %. Physical Exam Constitutional:      General: He is not in acute distress.    Appearance: He is well-developed. He is not diaphoretic.  HENT:     Head: Normocephalic and atraumatic.  Eyes:     General: No scleral icterus.       Right eye: No discharge.        Left eye: No discharge.     Conjunctiva/sclera: Conjunctivae normal.  Cardiovascular:     Rate and Rhythm: Normal rate  and regular rhythm.  Pulmonary:     Effort: Pulmonary effort is normal. No respiratory distress.  Musculoskeletal:     Cervical back: Normal range of motion.     Comments: Left shoulder, elbow, wrist, digits- no skin wounds, mod TTP diffuse shoulder,  no instability, no blocks to motion  Sens  Ax/R/M/U intact  Mot   Ax/ R/ PIN/ M/ AIN/ U intact but 3/5  Rad 2+  LLE No traumatic wounds, ecchymosis, or rash  Mild TTP  Mod knee effusion  Sens DPN, SPN, TN intact  Motor EHL, ext, flex, evers 5/5  DP 2+, PT 2+, No significant edema  Skin:    General: Skin is warm and dry.  Neurological:     Mental Status: He is alert.  Psychiatric:        Mood and Affect: Mood normal.        Behavior: Behavior normal.    Assessment/Plan: Left rotator cuff tear -- Needs a multiweek course of consistent PT, may very well be able to treat non-operatively. F/u with Dr. Stann Mainland upon discharge. May WBAT, no movement restrictions. I'm not sure pt's weakness, which includes the hand/wrist, is explained by the rotator cuff. May need EMG studies. Left knee pseudogout -- Offered steroid injection but pt refused, says he's been stuck enough and it feels better since tap. Would strongly consider course of oral steroids for both joints.    Lisette Abu, PA-C Orthopedic Surgery [phone] 02/06/2022, 11:06 AM

## 2022-02-06 NOTE — Plan of Care (Signed)
02/06/22 0700 ? ?Contacted by RN staff for patient having 10/10 chest pain. EKG obtained.  ? ?Patient seen at bedside. Patient reports 10/10 substernal chest pain that is stabbing in nature. Denies burning pain, denies pain radiation to shoulder or jaw. Reports this began ago when patient was sitting up to order breakfast. Patient states when he has chest pain at home he takes nitroglycerin which helps. Has hs of heartburn though reports this feels different. ? ?EKG reviewed and personal interpretation is NSR, tiny ST depression inferior leads similar to prior, minimal STE in V2 similar to prior EKG from 02/04/22. No other ischemic changes. ? ?Notable patient also found to have torn rotator cuff (supraspinatus, infraspinatus, subscapularis). ? ?Updated: patient now no longer endorsing chest pain per RN. ? ?On assessment, patient having atypical chest pain with some features concerning for cardiac chest pain though EKG unchanged from prior. Pain may also be related to torn rotator cuff though will check troponins.  ? ?Plan: ?-F/u troponins ?-Sublingual nitroglycerin as needed ?-CTM for cardiac chest pain ? ?Ellison Carwin, MD ?02/06/22,  7:18 AM ?Pager: [phone] ?Internal Medicine Resident, PGY-1 ?[facility]  ?  ? ?

## 2022-02-06 NOTE — Progress Notes (Signed)
Physical Therapy Treatment ?Patient Details ?Name: Darryl Hurley ?MRN: [id number] ?DOB: 1961-06-28 ?Today's Date: 02/06/2022 ? ? ?History of Present Illness Darryl Hurley is a 61 y.o. male who presented to the ED with left sided pain&weakness, numbness, slightly slurred speech and ?facial droop. CT head with no intracranial abnormality, MRI demonstrated mild-to-moderate spinal canal stenosis at C3-C4; Multilevel uncovertebral and facet arthropathy, worst at C7-T1, where it is severe & left neural foraminal narrowing at every level in the cervical spine. PMH significant for alcohol abuse, anemia, angina pectoris, arthritis, asthma, chronic lower back pain, COPD, previously on 2L [place] at home, DM, HTN, previous hx of seizures and bilateral knee surgeries. ? ?  ?PT Comments  ? ? Pt making slow progress and was able to amb very short distance in room with assist. Pt denies pain today and states his LLE and knee are feeling somewhat better but LLE is still not moving well with pt having to assist it to move off of the bed. Continue to feel pt can benefit from inpatient rehab.    ?Recommendations for follow up therapy are one component of a multi-disciplinary discharge planning process, led by the attending physician.  Recommendations may be updated based on patient status, additional functional criteria and insurance authorization. ? ?Follow Up Recommendations ? Acute inpatient rehab (3hours/day) ?  ?  ?Assistance Recommended at Discharge Frequent or constant Supervision/Assistance  ?Patient can return home with the following A little help with walking and/or transfers;Help with stairs or ramp for entrance ?  ?Equipment Recommendations ? Wheelchair (measurements PT);Wheelchair cushion (measurements PT)  ?  ?Recommendations for Other Services   ? ? ?  ?Precautions / Restrictions Precautions ?Precautions: Fall ?Restrictions ?Weight Bearing Restrictions: No  ?  ? ?Mobility ? Bed Mobility ?Overal bed mobility: Needs  Assistance ?Bed Mobility: Supine to Sit ?  ?  ?Supine to sit: Supervision, HOB elevated ?  ?  ?General bed mobility comments: Incr time and pt using RUE and RLE to assist LLE off of the bed. ?  ? ?Transfers ?Overall transfer level: Needs assistance ?Equipment used: Rolling walker (2 wheels), Ambulation equipment used ?Transfers: Sit to/from Stand ?Sit to Stand: Min assist ?  ?  ?  ?  ?  ?General transfer comment: Assist to bring hips up and to stabilize while moving hands to walker ?  ? ?Ambulation/Gait ?Ambulation/Gait assistance: Min assist ?Gait Distance (Feet): 10 Feet (x 2) ?Assistive device: Rolling walker (2 wheels) ?Gait Pattern/deviations: Step-to pattern, Decreased step length - right, Decreased step length - left, Knee flexed in stance - left, Trunk flexed, Decreased dorsiflexion - left ?Gait velocity: decr ?Gait velocity interpretation: <1.31 ft/sec, indicative of household ambulator ?  ?General Gait Details: Assist for balance and support. Verbal cues to keep feet inside legs of walker to give more support. Pt with incr effort to bring LLE forward. ? ? ?Stairs ?  ?  ?  ?  ?  ? ? ?Wheelchair Mobility ?  ? ?Modified Rankin (Stroke Patients Only) ?  ? ? ?  ?Balance Overall balance assessment: Needs assistance ?Sitting-balance support: Feet supported ?Sitting balance-Leahy Scale: Fair ?  ?  ?Standing balance support: Bilateral upper extremity supported ?Standing balance-Leahy Scale: Poor ?Standing balance comment: walker and min guard for static standing ?  ?  ?  ?  ?  ?  ?  ?  ?  ?  ?  ?  ? ?  ?Cognition Arousal/Alertness: Awake/alert ?Behavior During Therapy: [facility] for tasks assessed/performed ?Overall Cognitive Status:  No family/caregiver present to determine baseline cognitive functioning ?Area of Impairment: Following commands, Safety/judgement, Awareness, Problem solving ?  ?  ?  ?  ?  ?  ?  ?  ?  ?  ?  ?Following Commands: Follows one step commands consistently ?Safety/Judgement: Decreased awareness of  safety ?Awareness: Emergent ?Problem Solving: Slow processing ?  ?  ?  ? ?  ?Exercises   ? ?  ?General Comments   ?  ?  ? ?Pertinent Vitals/Pain Pain Assessment ?Pain Assessment: No/denies pain  ? ? ?Home Living   ?  ?  ?  ?  ?  ?  ?  ?  ?  ?   ?  ?Prior Function    ?  ?  ?   ? ?PT Goals (current goals can now be found in the care plan section) Acute Rehab PT Goals ?Patient Stated Goal: go home ?Progress towards PT goals: Progressing toward goals;Goals met and updated - see care plan ? ?  ?Frequency ? ? ? Min 3X/week ? ? ? ?  ?PT Plan Current plan remains appropriate  ? ? ?Co-evaluation   ?  ?  ?  ?  ? ?  ?AM-PAC PT "6 Clicks" Mobility   ?Outcome Measure ? Help needed turning from your back to your side while in a flat bed without using bedrails?: A Little ?Help needed moving from lying on your back to sitting on the side of a flat bed without using bedrails?: A Little ?Help needed moving to and from a bed to a chair (including a wheelchair)?: A Little ?Help needed standing up from a chair using your arms (e.g., wheelchair or bedside chair)?: A Little ?Help needed to walk in hospital room?: Total ?Help needed climbing 3-5 steps with a railing? : Total ?6 Click Score: 14 ? ?  ?End of Session Equipment Utilized During Treatment: Gait belt ?Activity Tolerance: Patient tolerated treatment well ?Patient left: in chair;with call bell/phone within reach;with chair alarm set ?Nurse Communication: Mobility status ?PT Visit Diagnosis: Other abnormalities of gait and mobility (R26.89) ?  ? ? ?Time: 7564-3329 ?PT Time Calculation (min) (ACUTE ONLY): 17 min ? ?Charges:  $Gait Training: 8-22 mins          ?          ? ?[facility] PT ?Acute Rehabilitation Services ?Pager [phone] ?Office [phone] ? ? ? ?Shary Decamp [facility] ?02/06/2022, 1:39 PM ? ?

## 2022-02-06 NOTE — Progress Notes (Signed)
@  [phone] Pt endorsed chest pain 10/10 associated with shortness of breath. 2L [place] applied, 12 lead EKG obtained, and Dr. Vinetta Bergamo, on-call for attending notified. MD endorsed they would visit pt. EKG indicated NSR. By [id number], pt endorsed pain had subsided. Will continue to monitor. ?

## 2022-02-06 NOTE — Progress Notes (Addendum)
? ?HD#2 ?SUBJECTIVE:  ?Patient Summary: Darryl Hurley is a 61 y.o. with a pertinent PMH of COPD, HTN, type 2 diabetes, and previous hx of seizures, who presented with left sided pain and weakness and admitted for stroke rule out.  ? ?Overnight Events: No acute events overnight. ? ?Interim History: This is hospital day 2 for this patient who was seen and evaluated at the bedside this morning. He states that he still feels weak, but feels a little bit better. He denies any trauma, recent injuries, or shoulder injuries as a kid.  ? ?OBJECTIVE:  ?Vital Signs: ?Vitals:  ? 02/06/22 0502 02/06/22 0825 02/06/22 0847 02/06/22 1144  ?BP: (!) 144/91  130/78 132/88  ?Pulse: 92  (!) 102 93  ?Resp: 16  18 16   ?Temp: 98.5 ?F (36.9 ?C)  98.6 ?F (37 ?C) 98.2 ?F (36.8 ?C)  ?TempSrc: Oral     ?SpO2: 94% 95% 96% 91%  ? ?Supplemental O2: Room Air ?SpO2: 91 % ? ?There were no vitals filed for this visit. ? ? ?Intake/Output Summary (Last 24 hours) at 02/06/2022 1344 ?Last data filed at 02/06/2022 0502 ?Gross per 24 hour  ?Intake --  ?Output 250 ml  ?Net -250 ml  ? ?Net IO Since Admission: -1,335 mL [02/06/22 1344] ? ?Physical Exam: ?General: No acute distress. Appears stated age. ?CV: RRR. No murmurs, rubs, or gallops.  ?Pulmonary: Lungs CTAB. Normal effort. No wheezing or rales. ?Abdominal: Soft, nontender, nondistended. Normal bowel sounds. ?Extremities: Left knee with less edema today, although still tender to palpation. DIP and PIP on bilateral hands are edematous.  ?Skin: Warm and dry.  ?Neuro: A&Ox3. 3/5 LUE and LLE strength.  ?Psych: Normal mood and affect ? ? ? ? ?ASSESSMENT/PLAN:  ?Assessment: ?Principal Problem: ?  Adhesive capsulitis of left shoulder ?Active Problems: ?  Primary hypertension ?  Gout ?  Effusion of left knee ?  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 ? ?Plan: ?#Left sided weakness, cerebral infarction ruled out ?#Left rotator cuff tear ?#Osteoarthritis ?Left shoulder is exquisitely tender and has very  limited ROM, which very likely could be contributing to this patient's upper extremity weakness. The patient's hands also show moderate-severe degeneration in multiple joints, which is consistent with long-standing, untreated arthritis which could be also contributing to this patient's pain and subsequent weakness. MRI L shoulder showed complete tear of supraspinatus tender with retraction, small tear of infraspinatus, and moderate tear of subscapularis. Will start patient on duloxetine for his neuropathic pain. ?- Ortho consulted appreciate recs ?- Continue to arthrocentesis cultures ?- Duloxetine 30 mg daily ?- PT/OT ?- Possible placement to CIR? ? ?#Left knee effusion s/p arthrocentesis  ?#Pseudogout ?Left knee had a large effusion which is now s/p arthrocentesis in the ED. Aspirated fluid showed >17,000 WBC although culture is with no growth to date at 24 hrs- findings are consistent with pseudogout. Patient declined steroid injection as offered by ortho. Ortho recommended oral course of steroids in place of this. ?- Prednisone 40 mg x 3 days, followed by 10-14 day taper ? ?#AKI ?Unclear renal function at baseline, however, Cr increased from 1.28 yesterday to 1.74 today. GFR 45.  ?- Renal ultrasound ?- Urinalysis  ?- Daily BMP ?- Avoid nephrotoxins ? ?#Hypokalemia ?K slightly low at 3.3 today. ?- Kclor 40 mEq x 2 doses ?- Daily BMP ? ?#Hypertension ?BP 144/91 this morning, overall improved from yesterday where SBP was in the 170s. Patient is on losartan 100 mg, amlodipine 10 mg, clonidine 0.1 mg  bid, and metoprolol succinate 50 mg daily. All of these medications were resumed. ? ?#Type 2 diabetes ?A1c 6.8, not on any medication.  ?  ?#Gout ?Has history of gout and reports that he usually gets flares in his big toes. On allopurinol 300 mg daily. Resumed this medication.  ? ? ?Best Practice: ?Diet: Regular diet ?IVF: Fluids: none ?VTE: enoxaparin (LOVENOX) injection 40 mg Start: 02/06/22 0800 ?SCDs Start: 02/04/22  1341 ?Code: Full ?AB: none ?Therapy Recs: CIR ?DISPO: Anticipated discharge to  Home vs CIR  pending Medical stability and placement . ? ?Signature: ?Elza Rafter, D.O.  ?Internal Medicine Resident, PGY-1 ?[facility]  ?Pager: [phone] ?1:44 PM, 02/06/2022  ? ?Please contact the on call pager after 5 pm and on weekends at [phone]. ? ?

## 2022-02-07 ENCOUNTER — Other Ambulatory Visit (HOSPITAL_COMMUNITY): Payer: Self-pay

## 2022-02-07 LAB — BASIC METABOLIC PANEL
Anion gap: 10 (ref 5–15)
BUN: 31 mg/dL — ABNORMAL HIGH (ref 6–20)
CO2: 23 mmol/L (ref 22–32)
Calcium: 9 mg/dL (ref 8.9–10.3)
Chloride: 97 mmol/L — ABNORMAL LOW (ref 98–111)
Creatinine, Ser: 1.78 mg/dL — ABNORMAL HIGH (ref 0.61–1.24)
GFR, Estimated: 43 mL/min — ABNORMAL LOW (ref >60)
Glucose, Bld: 320 mg/dL — ABNORMAL HIGH (ref 70–99)
Potassium: 4.5 mmol/L (ref 3.5–5.1)
Sodium: 130 mmol/L — ABNORMAL LOW (ref 135–145)

## 2022-02-07 LAB — GLUCOSE, CAPILLARY
Glucose-Capillary: 282 mg/dL — ABNORMAL HIGH (ref 70–99)
Glucose-Capillary: 151 mg/dL — ABNORMAL HIGH (ref 70–99)

## 2022-02-07 MED ORDER — PREDNISONE 20 MG PO TABS
ORAL_TABLET | ORAL | 0 refills | Status: AC
  Filled 2022-02-07: qty 11, 10d supply, fill #0

## 2022-02-07 MED ORDER — DULOXETINE HCL 30 MG PO CPEP
30.0000 mg | ORAL_CAPSULE | Freq: Every day | ORAL | 0 refills | Status: AC
Start: 2022-02-08 — End: 2022-03-10
  Filled 2022-02-07: qty 30, 30d supply, fill #0

## 2022-02-07 MED ORDER — DICLOFENAC SODIUM 1 % EX GEL
2.0000 g | Freq: Four times a day (QID) | CUTANEOUS | 0 refills | Status: AC | PRN
Start: 2022-02-07 — End: ?
  Filled 2022-02-07: qty 100, 7d supply, fill #0

## 2022-02-07 NOTE — Discharge Summary (Signed)
Name: Darryl Hurley MRN: [id number] DOB: [birth year] 61 y.o. PCP: Pcp, No  Date of Admission: 02/04/2022  9:45 AM Date of Discharge:  02/07/2022 Attending Physician: Dr. 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 What changed:  how much to take reasons to take this   albuterol 108 (90 Base) MCG/ACT inhaler Commonly known as: VENTOLIN HFA Inhale 1 puff into the lungs every 6 (six) hours as needed for wheezing or shortness of breath.   allopurinol 300 MG tablet Commonly known as: ZYLOPRIM Take 300 mg by mouth daily.   amLODipine 10 MG tablet Commonly known as: NORVASC Take 10 mg by mouth daily.   aspirin EC 81 MG tablet Take 1 tablet (81 mg total) by mouth daily. Swallow whole. What changed: additional instructions   cloNIDine 0.1 MG tablet Commonly known as: CATAPRES Take 0.1 mg by mouth 2 (two) times daily.   colchicine 0.6 MG tablet Take 0.6 mg by mouth daily as needed (gout flare).   diclofenac Sodium 1 % Gel Commonly known as: VOLTAREN Apply 2 g topically 4 (four) times daily as needed (For Knee Pain).   DULoxetine 30 MG capsule Commonly known as: CYMBALTA Take 1 capsule (30 mg total) by mouth daily. Start taking on: February 08, 2022   fluorometholone 0.1 % ophthalmic suspension Commonly known as: FML Place 1 drop into both eyes daily.    fluticasone 50 MCG/ACT nasal spray Commonly known as: FLONASE Place 1 spray into both nostrils daily.   gabapentin 600 MG tablet Commonly known as: NEURONTIN Take 600 mg by mouth 3 (three) times daily.   lidocaine 5 % Commonly known as: LIDODERM Place 1 patch onto the skin daily.   losartan 100 MG tablet Commonly known as: COZAAR Take 100 mg by mouth daily.   metoprolol succinate 50 MG 24 hr tablet Commonly known as: TOPROL-XL Take 1 tablet (50 mg total) by mouth daily. Take with or immediately following a meal. What changed: additional instructions   nitroGLYCERIN 0.4 MG SL tablet Commonly known as: NITROSTAT Place 1 tablet (0.4 mg total) under the tongue every 5 (five) minutes x 3 doses as needed for chest pain.   oxyCODONE 5 MG immediate release tablet Commonly known as: Oxy IR/ROXICODONE Take 5 mg by mouth every 6 (six) hours as needed for severe pain.   predniSONE 20 MG tablet Commonly known as: DELTASONE Take 2 tabs (40 mg total) daily with breakfast for 1 day, THEN 1.5 tabs (30 mg total) daily for 3 days, THEN 1 tab (20 mg total) daily for 3 days, THEN 1/2 tab (10 mg total) daily for 3 days. Start taking on: February 08, 2022   Restasis 0.05 % ophthalmic emulsion Generic drug: cycloSPORINE Place 1 drop into both eyes daily.   rosuvastatin 40 MG  tablet Commonly known as: CRESTOR Take 40 mg by mouth daily.   Symbicort 160-4.5 MCG/ACT inhaler Generic drug: budesonide-formoterol Inhale 2 puffs into the lungs 2 (two) times daily.               Durable Medical Equipment  (From admission, onward)           Start     Ordered   02/06/22 1053  For home use only DME Cane  Once        02/06/22 1053   02/06/22 1053  For home use only DME Shower stool  Once        02/06/22 1053            DISPOSITION AND FOLLOW-UP:  Mr.Lanson R Buecker was discharged from [facility] in Stable condition. At the hospital follow up visit please  address:  Left rotator cuff tear: Patient will need ortho follow up. Also will need extensive PT (referral placed).  Left knee pseudogout: Patient declined steroid injection. On prednisone taper- would check blood sugars because of this. Assess symptoms and patient's gait.  Arthritis: Started on duloxetine 30 mg for neuropathic/MSK pain. Assess symptoms, and increase dose if needed  CKD: Unclear renal function baseline, although suspect he has underlying CKD.   Follow-up Recommendations: Consults: Ortho Labs: Basic Metabolic Profile, Blood Sugar, and Hgb A1C (on steroids) Studies: none New Medications:  Prednisone taper Duloxetine 30 mg daily Voltaren gel prn   Follow-up Appointments:  Follow-up Information     [facility] Follow up on 02/16/2022.   Why: Your appointment is at 9:30 am. Please arrive early and bring your insurance card, current medications and picture ID Contact information: [address], [place] Phone: [phone] Mon & Thurs : 8:00am - 5:00pm  Appointment is with Dr Lot        Stann Mainland, Elly Modena, MD. Call.   Specialty: Orthopedic Surgery Why: Call to make an appointment regarding your knee and shoulder (rotator cuff tear) Contact information: [address] [place] [address] [phone]                 HOSPITAL COURSE:  Patient Summary: #Left sided weakness, cerebral infarction ruled out #Left rotator cuff tear #Osteoarthritis Patient presented with left sided weakness, some mild left sided facial droop, and slight slurring/stuttering of his speech. The weakness has been ongoing for a few days, but the speech changes were first noticed this morning by the patient's brother. CT head in the ED with no acute intracranial abnormality. CTA head with moderate stenosis of R ICA and moderate to severe stenosis of L ICA. Also noted to have severe stenosis of the L ACA and severe  stenosis within a R PCA branch. Neurology was consulted from the ED for further evaluation. Neurology had signed off after MRI Brain ruled out acute intracranial abnormality. MRI cervical spine was performed and showed severe facet arthropathy of C7-T1, although neurosurgery deemed that the patient is not a candidate for any interventions. With continued left shoulder pain, an MRI was obtained. Left shoulder is exquisitely tender and has very limited ROM, which very likely could be contributing to this patient's upper extremity weakness. MRI L shoulder showed complete tear of supraspinatus tender with retraction, small tear of infraspinatus, and moderate tear of subscapularis. The patient's hands also show moderate-severe degeneration in multiple joints as seen on xray, which is consistent with long-standing, untreated arthritis which could be also contributing to  this patient's pain and subsequent weakness. Started patient on duloxetine for his neuropathic pain. He will require extensive outpatient physical therapy for rehabilitation of his shoulder.   #Left knee effusion s/p arthrocentesis  #Pseudogout Patient presented with severe left knee pain and generalized decreased movement on his left side. Knee xray did show a large effusion and tricompartmental osteoarthritis of the left knee. Left is now s/p arthrocentesis in the ED. Aspirated fluid showed >17,000 WBC although culture is with no growth to date at 24 hrs- findings are consistent with pseudogout. Patient declined steroid injection as offered by ortho. Ortho recommended oral course of steroids in place of this and the patient was started on prednisone 40 mg x 3 days, followed by 30 mg x 3 days, then 20 mg x 3 days, and then 10 mg x 3 days. Patient will need to follow up with Dr. Stann Mainland (ortho) as an outpatient for both this, and his rotator cuff tear.   #CKD Unclear renal function at baseline, however, Cr increased from 1.28 to 1.74 during  hospitalization. GFR 45. Renal ultrasound showed medical renal disease and no hydronephrosis or obstruction- suspect that the patient has underlying CKD.   #Hypertension Well controlled on home losartan 100 mg, amlodipine 10 mg, clonidine 0.1 mg bid, and metoprolol succinate 50 mg daily.   #Type 2 diabetes A1c 6.8, not on any medication.    #Gout Has history of gout and reports that he usually gets flares in his big toes. On allopurinol 300 mg daily. Resumed this medication.    DISCHARGE INSTRUCTIONS:   Discharge Instructions     Ambulatory referral to Physical Therapy   Complete by: As directed    Left rotator cuff tear- needs extensive PT   Call MD for:  difficulty breathing, headache or visual disturbances   Complete by: As directed    Call MD for:  severe uncontrolled pain   Complete by: As directed    Diet - low sodium heart healthy   Complete by: As directed    Discharge instructions   Complete by: As directed    Dear Mr. Fechner,  You were hospitalized for left sided pain and weakness. You did not have a stroke, but rather, we found that you tore your rotator cuff muscles in your left shoulder. You also had fluid in your left knee that was removed in the emergency room, but you will need to take steroids (prednisone) for this.   We also did xrays of your hands which showed extensive arthritis. You should take duloxetine 30 mg daily to help with this pain. You can also continue to use the voltaren gel on your shoulder and on your knee, as needed, for pain.   We have referred you to get physical therapy so you can rehabilitate your left shoulder. Additionally, you will need to call the orthopedist (Dr. Victorino December) to set up an appointment with him. We are glad you are feeling better and stronger!   Increase activity slowly   Complete by: As directed        SUBJECTIVE:  JOHNCHRISTOPHER YAMAZAKI was seen and evaluated on the day of discharge. He is feeling a lot stronger today and  his pain is manageable. He is eager to go home.   Discharge Vitals:   BP (!) 140/92 (BP Location: Left Arm)    Pulse 80    Temp 97.8 F (36.6 C) (Oral)    Resp 14    SpO2 96%   OBJECTIVE:  General: Pleasant, middle aged male sitting in bedside chair. No acute distress. CV: RRR. No murmurs, rubs, or gallops.  Pulmonary: Lungs CTAB. Normal effort. No wheezing or rales. Abdominal: Soft, nontender, nondistended. Normal bowel sounds. Extremities: Left knee with less edema, less tenderness to palpation. Left shoulder with slightly improved ROM.  Skin: Warm and dry. No obvious rash or lesions. Neuro: A&Ox3. 4/5 LUE and LLE strength, improved. Seen ambulating with PT/OT and did well.  Psych: Normal mood and affect    Pertinent Labs, Studies, and Procedures:  CBC Latest Ref Rng & Units 02/05/2022 02/04/2022 09/09/2021  WBC 4.0 - 10.5 K/uL 10.4 10.1 6.7  Hemoglobin 13.0 - 17.0 g/dL 13.2 13.4 14.0  Hematocrit 39.0 - 52.0 % 39.4 40.1 42.5  Platelets 150 - 400 K/uL 249 243 261    CMP Latest Ref Rng & Units 02/07/2022 02/06/2022 02/05/2022  Glucose 70 - 99 mg/dL 320(H) 198(H) 184(H)  BUN 6 - 20 mg/dL 31(H) 17 13  Creatinine 0.61 - 1.24 mg/dL 1.78(H) 1.74(H) 1.28(H)  Sodium 135 - 145 mmol/L 130(L) 131(L) 134(L)  Potassium 3.5 - 5.1 mmol/L 4.5 3.3(L) 3.5  Chloride 98 - 111 mmol/L 97(L) 95(L) 98  CO2 22 - 32 mmol/L 23 25 25   Calcium 8.9 - 10.3 mg/dL 9.0 8.9 9.2  Total Protein 6.5 - 8.1 g/dL - - -  Total Bilirubin 0.3 - 1.2 mg/dL - - -  Alkaline Phos 38 - 126 U/L - - -  AST 15 - 41 U/L - - -  ALT 0 - 44 U/L - - -    CT ANGIO HEAD NECK W WO CM  Result Date: 02/04/2022 CLINICAL DATA:  Provided history: Neuro deficit, acute, stroke suspected. Additional history provided: Patient reports 6 days of left-sided weakness and slurred speech. EXAM: CT ANGIOGRAPHY HEAD AND NECK TECHNIQUE: Multidetector CT imaging of the head and neck was performed using the standard protocol during bolus administration of  intravenous contrast. Multiplanar CT image reconstructions and MIPs were obtained to evaluate the vascular anatomy. Carotid stenosis measurements (when applicable) are obtained utilizing NASCET criteria, using the distal internal carotid diameter as the denominator. RADIATION DOSE REDUCTION: This exam was performed according to the departmental dose-optimization program which includes automated exposure control, adjustment of the mA and/or kV according to patient size and/or use of iterative reconstruction technique. CONTRAST:  24mL OMNIPAQUE IOHEXOL 350 MG/ML SOLN COMPARISON:  Prior head CT examinations 12/25/2018 and earlier. FINDINGS: CT HEAD FINDINGS Brain: Mild generalized parenchymal atrophy. Mild patchy and ill-defined hypoattenuation within the cerebral white matter, nonspecific but compatible with chronic small vessel ischemic disease. There is no acute intracranial hemorrhage. No demarcated cortical infarct. No extra-axial fluid collection. No evidence of an intracranial mass. No midline shift. Vascular: No hyperdense vessel.  Atherosclerotic calcifications. Skull: Normal. Negative for fracture or focal lesion. Sinuses: Moderate mucosal thickening within the left maxillary sinus. Small mucous retention cysts within the right maxillary sinus. Mild mucosal thickening within the bilateral frontal sinuses. Orbits: No orbital mass or acute orbital finding. Bilateral proptosis. Chronic medially displaced fracture deformity of the left lamina papyracea. Review of the MIP images confirms the above findings CTA NECK FINDINGS Aortic arch: Standard aortic branching. Atherosclerotic plaque within the visualized aortic arch and proximal major branch vessels of the neck. Streak and beam hardening artifact arising from a dense left-sided contrast bolus partially obscures the left subclavian artery. Within this limitation, there is no appreciable innominate or proximal subclavian artery stenosis. Right carotid system: CCA  and ICA patent  within the neck without significant stenosis (50% or greater). Moderate soft and calcified plaque about the carotid bifurcation and within the proximal ICA. Left carotid system: CCA and ICA patent within the neck without significant stenosis (50% or greater). Mild soft and calcified plaque within the CCA, about the carotid bifurcation and within the proximal ICA. Vertebral arteries: Vertebral arteries codominant and patent within the neck without hemodynamically significant stenosis. Mild calcified plaque within the proximal V2 left vertebral artery. Skeleton: Cervical spondylosis. Fusion across the C6-C7 disc space. Reversal of the expected cervical lordosis. No acute bony abnormality or aggressive osseous lesion. Other neck: No neck mass or cervical lymphadenopathy. Upper chest: No consolidation within the imaged lung apices. Review of the MIP images confirms the above findings CTA HEAD FINDINGS Anterior circulation: The intracranial internal carotid arteries are patent. Moderate stenosis at the right petrous-cavernous junction. Apparent moderate-to-severe stenosis within the cavernous left ICA (series 15, image 115). The M1 middle cerebral arteries are patent. No M2 proximal branch occlusion or high-grade proximal stenosis is identified. The anterior cerebral arteries are patent. Severe stenosis within the left ACA at the A2/A3 junction. No intracranial aneurysm is identified. 1-2 mm in thickness-vascular protrusion arising from the paraclinoid left ICA, which may reflect an infundibulum or small aneurysm (series 15, image 113). Posterior circulation: The intracranial vertebral arteries are patent. Plaque within the right vertebral artery at the level of the skull base resulting in at least moderate stenosis. The basilar artery is patent. The posterior cerebral arteries are patent. Fetal origin right PCA. Severe stenosis within a right PCA branch at the P2/P3 junction. The left posterior  communicating artery is small or absent. Venous sinuses: Within the limitations of contrast timing, no convincing thrombus. Anatomic variants: As described. Review of the MIP images confirms the above findings IMPRESSION: CT head: 1. No evidence of acute intracranial abnormality. 2. Mild chronic small vessel ischemic changes within the cerebral white matter. 3. Mild generalized parenchymal atrophy. 4. Bilateral proptosis. 5. Paranasal sinus disease, as described. CTA neck: 1. The common carotid, internal carotid and vertebral arteries are patent within the neck without hemodynamically significant stenosis. Atherosclerotic plaque within the common carotid, internal carotid and left vertebral arteries as described. 2. Cervical spondylosis. Fusion across the C6-C7 disc space anteriorly. 3. Nonspecific reversal of the expected cervical lordosis. CTA head: 1. No intracranial large vessel occlusion is identified. 2. Intracranial atherosclerotic disease with multifocal stenoses, most notably as follows. 3. Moderate stenosis within the right ICA at the petrous-cavernous junction. 4. Apparent moderate to severe stenosis within the cavernous left ICA. 5. Severe stenosis within the left anterior cerebral artery at the A2/A3 junction. 6. Calcified plaque within the right vertebral artery at the level of the skull base resulting in at least moderate stenosis. 7. Severe stenosis within a right PCA branch at the P2/P3 junction. 8. 1-2 mm inferiorly projecting vascular protrusion arising from the paraclinoid left ICA, which may reflect an infundibulum or small aneurysm. Electronically Signed   By: Kellie Simmering D.O.   On: 02/04/2022 11:57   DG Chest 2 View  Result Date: 02/04/2022 CLINICAL DATA:  Left-sided weakness, slurred speech for 6 days EXAM: CHEST - 2 VIEW COMPARISON:  09/09/2021 FINDINGS: The heart size and mediastinal contours are within normal limits. Both lungs are clear. The visualized skeletal structures are  unremarkable. IMPRESSION: No active cardiopulmonary disease. Electronically Signed   By: Kathreen Devoid M.D.   On: 02/04/2022 10:56   MR BRAIN WO CONTRAST  Result Date: 02/04/2022  CLINICAL DATA:  Neuro deficit, acute, stroke suspected. EXAM: MRI HEAD WITHOUT CONTRAST TECHNIQUE: Multiplanar, multiecho pulse sequences of the brain and surrounding structures were obtained without intravenous contrast. COMPARISON:  Noncontrast head CT and CT angiogram head/neck performed earlier today 02/04/2022. Prior head CT examinations 12/25/2018 and earlier FINDINGS: Moderate motion degradation of the coronal T2 TSE sequence. No more than mild motion degradation of the remaining cord sequences. Brain: Mild generalized cerebral and cerebellar atrophy. Moderate multifocal T2 FLAIR hyperintense signal abnormality within the cerebral white matter, nonspecific but compatible with chronic small vessel ischemic disease. Punctate chronic microhemorrhages within the right thalamus and within the pons. There is no acute infarct. No evidence of an intracranial mass. No extra-axial fluid collection. No midline shift. Vascular: Maintained flow voids within the proximal large arterial vessels. Skull and upper cervical spine: No focal suspicious marrow lesion. Sinuses/Orbits: Visualized orbits show no acute finding. Moderate mucosal thickening within the left maxillary sinus. Small mucous retention cyst within the right maxillary sinus. Mild mucosal thickening within the left sphenoid sinus. Mild mucosal thickening within the bilateral ethmoid and frontal sinuses. Chronic medially displaced fracture deformity of the left lamina papyracea. IMPRESSION: No evidence of acute intracranial abnormality. Moderate chronic small vessel ischemic changes within the cerebral white matter. Mild generalized parenchymal atrophy. Paranasal sinus disease, as described. Electronically Signed   By: Kellie Simmering D.O.   On: 02/04/2022 16:32   MR CERVICAL SPINE WO  CONTRAST  Result Date: 02/05/2022 CLINICAL DATA:  Chronic neck pain EXAM: MRI CERVICAL SPINE WITHOUT CONTRAST TECHNIQUE: Multiplanar, multisequence MR imaging of the cervical spine was performed. No intravenous contrast was administered. COMPARISON:  No prior MRI, correlation is made with CT cervical spine 09/09/2021 FINDINGS: Evaluation is somewhat limited by motion artifact. Alignment: Straightening and mild reversal of the normal cervical lordosis. No significant listhesis. Vertebrae: No acute fracture or suspicious osseous lesion. Degenerative fusion of the anterior aspects of C6 and C7, which is better seen on the prior CT. Congenitally short pedicles, which narrow the AP diameter of the spinal canal. Cord: Normal signal and morphology. Posterior Fossa, vertebral arteries, paraspinal tissues: Negative. Disc levels: C2-C3: No significant disc bulge. Facet and uncovertebral hypertrophy. No spinal canal stenosis. Mild left neural foraminal narrowing. C3-C4: Broad-based disc bulge. Uncovertebral and facet arthropathy. Mild-to-moderate spinal canal stenosis. Mild-to-moderate left neural foraminal narrowing. C4-C5: Mild disc bulge. Facet and uncovertebral hypertrophy. Mild spinal canal stenosis. Mild left and moderate right neural foraminal narrowing. C5-C6: Mild disc bulge. Uncovertebral and facet arthropathy. No spinal canal stenosis. Mild left neural foraminal narrowing. C6-C7: No significant disc bulge. Facet and uncovertebral hypertrophy. Ligamentum flavum hypertrophy. Mild spinal canal stenosis. Moderate left-greater-than-right neural foraminal narrowing. C7-T1: Mild disc bulge. Ligamentum flavum hypertrophy. Facet and uncovertebral hypertrophy. Mild spinal canal stenosis. Severe left-greater-than-right neural foraminal narrowing. IMPRESSION: 1. Evaluation is somewhat limited by motion artifact. Within this limitation, congenitally short pedicles and superimposed degenerative changes, which cause  mild-to-moderate spinal canal stenosis at C3-C4 and mild spinal canal stenosis at C4-C5, C6-C7, and C7-T1. No definite cord signal abnormality. 2. Multilevel uncovertebral and facet arthropathy, which overall left-greater-than-right and is worst at C7-T1, where it is severe. There is at least mild left neural foraminal narrowing at every level in the cervical spine, described in detail above. Electronically Signed   By: Merilyn Baba M.D.   On: 02/05/2022 00:47   MR Shoulder Left W Wo Contrast  Result Date: 02/06/2022 CLINICAL DATA:  Evaluate for labral tear.  Shoulder pain. EXAM: MRI OF THE  LEFT SHOULDER WITHOUT AND WITH CONTRAST TECHNIQUE: Multiplanar, multisequence MR imaging of the LEFT shoulder was performed before and after the administration of intravenous contrast. CONTRAST:  81mL GADAVIST GADOBUTROL 1 MMOL/ML IV SOLN COMPARISON:  None. FINDINGS: Severe patient motion degrades image quality limiting evaluation. Rotator cuff: Complete tear of the supraspinatus tendon with 2.8 cm of retraction. Small full-thickness tear of the anterior aspect of the infraspinatus tendon. Teres minor tendon is intact. Moderate tendinosis of the subscapularis tendon with a partial-thickness tear. Muscles: No muscle atrophy or edema. No intramuscular fluid collection or hematoma. Biceps Long Head: Moderate tendinosis of the intra-articular portion of the long head of the biceps tendon. Medial dislocation of the proximal extra-articular portion of the long head of the biceps tendon. Acromioclavicular Joint: Moderate arthropathy of the acromioclavicular joint. Small amount of subacromial/subdeltoid bursal fluid. Glenohumeral Joint: Moderate joint effusion with synovitis. Generalized chondral thinning. Labrum: Grossly intact, but evaluation is limited by lack of intraarticular fluid/contrast. Bones: No fracture or dislocation. No aggressive osseous lesion. Other: No fluid collection or hematoma. IMPRESSION: 1. Severe patient  motion degrades image quality limiting evaluation. 2. Complete tear of the supraspinatus tendon with 2.8 cm of retraction. 3. Small full-thickness tear of the anterior aspect of the infraspinatus tendon. 4. Moderate tendinosis of the subscapularis tendon with a partial-thickness tear. 5. Moderate tendinosis of the intra-articular portion of the long head of the biceps tendon. Medial dislocation of the proximal extra-articular portion of the long head of the biceps tendon. 6. Moderate joint effusion with synovitis. Electronically Signed   By: Kathreen Devoid M.D.   On: 02/06/2022 06:36   DG Shoulder Left  Result Date: 02/04/2022 CLINICAL DATA:  Left shoulder pain. EXAM: LEFT SHOULDER - 2+ VIEW COMPARISON:  None. FINDINGS: No acute fracture or dislocation. No aggressive osseous lesion. Normal alignment. Generalized osteopenia. Mild arthropathy of the acromioclavicular joint. Glenohumeral joint space is maintained. Soft tissue are unremarkable. No radiopaque foreign body or soft tissue emphysema. IMPRESSION: 1. No acute osseous injury of the left shoulder. Electronically Signed   By: Kathreen Devoid M.D.   On: 02/04/2022 10:54   DG Knee Complete 4 Views Left  Result Date: 02/04/2022 CLINICAL DATA:  Left knee pain EXAM: LEFT KNEE - COMPLETE 4+ VIEW COMPARISON:  11/19/2019 FINDINGS: No acute fracture or dislocation. No aggressive osseous lesion. Normal alignment. Generalized osteopenia. Moderate medial femorotibial compartment osteoarthritis. Mild lateral femorotibial compartment osteoarthritis. Mild patellofemoral compartment osteoarthritis. Large joint effusion. Soft tissue are unremarkable. No radiopaque foreign body or soft tissue emphysema. Peripheral vascular atherosclerotic disease. IMPRESSION: 1. Tricompartmental osteoarthritis of the left knee. Large joint effusion. 2.  No acute osseous injury of the left knee. Electronically Signed   By: Kathreen Devoid M.D.   On: 02/04/2022 10:55   DG Hand Complete  Left  Result Date: 02/05/2022 CLINICAL DATA:  Pain EXAM: LEFT HAND - COMPLETE 3+ VIEW COMPARISON:  None. FINDINGS: No recent fracture or dislocation is seen. Degenerative changes are noted in multiple carpometacarpal, metacarpophalangeal and interphalangeal joints. Degenerative changes are particularly severe in the first metacarpophalangeal joint, distal interphalangeal joint of index finger, PIP joint of index finger and PIP joint of ring finger. There are subcortical cysts in multiple phalanges and head of the first metacarpal, most likely due to degenerative arthritis. IMPRESSION: No recent fracture or dislocation is seen in the left hand. Degenerative changes are noted in multiple joints as described in the body of the report. Electronically Signed   By: Elmer Picker M.D.   On:  02/05/2022 12:49   DG Hand Complete Right  Result Date: 02/05/2022 CLINICAL DATA:  Pain, arthritis EXAM: RIGHT HAND - COMPLETE 3+ VIEW COMPARISON:  None. FINDINGS: No recent fracture or dislocation is seen. Degenerative changes are noted in the multiple carpometacarpal, metacarpophalangeal and interphalangeal joints. Degenerative changes are also noted in the the right wrist. Degenerative changes are particularly severe in the fifth carpometacarpal joint and PIP and DIP joints of fifth finger. IMPRESSION: No recent fracture or dislocation is seen. Degenerative changes are noted in multiple joints as described in the body of the report. Electronically Signed   By: Elmer Picker M.D.   On: 02/05/2022 12:51   ECHOCARDIOGRAM COMPLETE BUBBLE STUDY  Result Date: 02/05/2022    ECHOCARDIOGRAM REPORT   Patient Name:   JHAIR FEICK Date of Exam: 02/05/2022 Medical Rec #:  [id number]       Height:       69.0 in Accession #:    [id number]      Weight:       180.0 lb Date of Birth:  1961-06-13       BSA:          1.976 m Patient Age:    20 years        BP:           174/103 mmHg Patient Gender: M               HR:           127  bpm. Exam Location:  Inpatient Procedure: 2D Echo, Cardiac Doppler, Color Doppler, 3D Echo, Strain Analysis and            Saline Contrast Bubble Study Indications:    CVA  History:        Patient has prior history of Echocardiogram examinations, most                 recent 03/30/2021. COPD, Signs/Symptoms:Shortness of Breath; Risk                 Factors:Hypertension and Diabetes.  Sonographer:    Luisa Hart RDCS Referring Phys: [place]  1. Left ventricular ejection fraction, by estimation, is 55 to 60%. The left ventricle has normal function. The left ventricle demonstrates regional wall motion abnormalities with basal inferior hypokinesis. There is mild left ventricular hypertrophy. Left ventricular diastolic parameters are consistent with Grade I diastolic dysfunction (impaired relaxation).  2. Right ventricular systolic function is normal. The right ventricular size is normal. There is normal pulmonary artery systolic pressure. The estimated right ventricular systolic pressure is 0000000 mmHg.  3. The aortic valve is tricuspid. Aortic valve regurgitation is not visualized. No aortic stenosis is present.  4. The mitral valve is normal in structure. No evidence of mitral valve regurgitation. No evidence of mitral stenosis.  5. The inferior vena cava is normal in size with greater than 50% respiratory variability, suggesting right atrial pressure of 3 mmHg.  6. Negative bubble study, no evidence for PFO or ASD. FINDINGS  Left Ventricle: Left ventricular ejection fraction, by estimation, is 55 to 60%. The left ventricle has normal function. The left ventricle demonstrates regional wall motion abnormalities. The left ventricular internal cavity size was normal in size. There is mild left ventricular hypertrophy. Left ventricular diastolic parameters are consistent with Grade I diastolic dysfunction (impaired relaxation). Right Ventricle: The right ventricular size is normal. No increase in  right ventricular wall thickness. Right ventricular systolic function is  normal. There is normal pulmonary artery systolic pressure. The tricuspid regurgitant velocity is 2.06 m/s, and  with an assumed right atrial pressure of 3 mmHg, the estimated right ventricular systolic pressure is 0000000 mmHg. Left Atrium: Left atrial size was normal in size. Right Atrium: Right atrial size was normal in size. Pericardium: There is no evidence of pericardial effusion. Mitral Valve: The mitral valve is normal in structure. No evidence of mitral valve regurgitation. No evidence of mitral valve stenosis. MV peak gradient, 3.7 mmHg. The mean mitral valve gradient is 1.0 mmHg. Tricuspid Valve: The tricuspid valve is normal in structure. Tricuspid valve regurgitation is trivial. Aortic Valve: The aortic valve is tricuspid. Aortic valve regurgitation is not visualized. No aortic stenosis is present. Aortic valve mean gradient measures 3.0 mmHg. Aortic valve peak gradient measures 5.4 mmHg. Aortic valve area, by VTI measures 3.24 cm. Pulmonic Valve: The pulmonic valve was normal in structure. Pulmonic valve regurgitation is not visualized. Aorta: The aortic root is normal in size and structure. Venous: The inferior vena cava is normal in size with greater than 50% respiratory variability, suggesting right atrial pressure of 3 mmHg. IAS/Shunts: Negative bubble study, no evidence for PFO or ASD. Agitated saline contrast was given intravenously to evaluate for intracardiac shunting.  LEFT VENTRICLE PLAX 2D LVIDd:         3.90 cm      Diastology LVIDs:         3.00 cm      LV e' medial:    4.46 cm/s LV PW:         1.40 cm      LV E/e' medial:  10.7 LV IVS:        1.60 cm      LV e' lateral:   6.48 cm/s LVOT diam:     2.20 cm      LV E/e' lateral: 7.4 LV SV:         55 LV SV Index:   28 LVOT Area:     3.80 cm  LV Volumes (MOD) LV vol d, MOD A2C: 31.0 ml LV vol d, MOD A4C: 112.0 ml LV vol s, MOD A2C: 30.6 ml LV vol s, MOD A4C: 37.2 ml LV SV  MOD A2C:     0.4 ml LV SV MOD A4C:     112.0 ml LV SV MOD BP:      25.2 ml RIGHT VENTRICLE RV Basal diam:  4.20 cm RV Mid diam:    2.50 cm RV S prime:     22.90 cm/s TAPSE (M-mode): 2.4 cm LEFT ATRIUM             Index LA diam:        3.40 cm 1.72 cm/m LA Vol (A2C):   38.7 ml 19.59 ml/m LA Vol (A4C):   24.2 ml 12.25 ml/m LA Biplane Vol: 31.3 ml 15.84 ml/m  AORTIC VALVE AV Area (Vmax):    2.84 cm AV Area (Vmean):   2.68 cm AV Area (VTI):     3.24 cm AV Vmax:           116.00 cm/s AV Vmean:          78.800 cm/s AV VTI:            0.170 m AV Peak Grad:      5.4 mmHg AV Mean Grad:      3.0 mmHg LVOT Vmax:         86.70 cm/s LVOT Vmean:  55.500 cm/s LVOT VTI:          0.145 m LVOT/AV VTI ratio: 0.85  AORTA Ao Root diam: 2.90 cm Ao Asc diam:  3.40 cm MITRAL VALVE               TRICUSPID VALVE MV Area (PHT): 4.93 cm    TR Peak grad:   17.0 mmHg MV Area VTI:   3.63 cm    TR Vmax:        206.00 cm/s MV Peak grad:  3.7 mmHg MV Mean grad:  1.0 mmHg    SHUNTS MV Vmax:       0.96 m/s    Systemic VTI:  0.14 m MV Vmean:      52.4 cm/s   Systemic Diam: 2.20 cm MV Decel Time: 154 msec MV E velocity: 47.85 cm/s MV A velocity: 89.20 cm/s MV E/A ratio:  0.54 Dalton McleanMD Electronically signed by Franki Monte Signature Date/Time: 02/05/2022/3:36:38 PM    Final      Signed: Buddy Duty, D.O.  Internal Medicine Resident, PGY-1 [facility]  Pager: [phone] 12:47 PM, 02/07/2022

## 2022-02-07 NOTE — Progress Notes (Signed)
Occupational Therapy Treatment ?Patient Details ?Name: SAFAL HALDERMAN ?MRN: [id number] ?DOB: 09/11/1961 ?Today's Date: 02/07/2022 ? ? ?History of present illness DRE GAMINO is a 61 y.o. male who presented to the ED with left sided pain&weakness, numbness, slightly slurred speech and ?facial droop. CT head with no intracranial abnormality, MRI demonstrated mild-to-moderate spinal canal stenosis at C3-C4; Multilevel uncovertebral and facet arthropathy, worst at C7-T1, where it is severe & left neural foraminal narrowing at every level in the cervical spine. PMH significant for alcohol abuse, anemia, angina pectoris, arthritis, asthma, chronic lower back pain, COPD, previously on 2L [place] at home, DM, HTN, previous hx of seizures and bilateral knee surgeries. ?  ?OT comments ? Aasim is making excellent progress. He was not as limited by pain this session. Overall he was min guard for functional ambulation and transfers including toileting with SPC. He has good functional use of his L elbow, wrist and hand but continues to be limited by painful shoulder ROM. Pt is adamant about d/c home. Due to functional improvement toward goals this date, recommend d/c home with 24/7 supervision and OP OT.   ? ?Recommendations for follow up therapy are one component of a multi-disciplinary discharge planning process, led by the attending physician.  Recommendations may be updated based on patient status, additional functional criteria and insurance authorization. ?   ?Follow Up Recommendations ? Outpatient OT  ?  ?Assistance Recommended at Discharge Frequent or constant Supervision/Assistance  ?Patient can return home with the following ? A little help with walking and/or transfers;A little help with bathing/dressing/bathroom;Assistance with cooking/housework;Assist for transportation;Help with stairs or ramp for entrance ?  ?Equipment Recommendations ? Tub/shower seat;Other (comment) [facility])  ?  ?   ?Precautions / Restrictions  Precautions ?Precautions: Fall ?Restrictions ?Weight Bearing Restrictions: No  ? ? ?  ? ?Mobility Bed Mobility ?Overal bed mobility: Needs Assistance ?  ?  ?  ?  ?  ?  ?General bed mobility comments: in chair upon arrival ?  ? ?Transfers ?Overall transfer level: Needs assistance ?Equipment used: Straight cane ?Transfers: Sit to/from Stand ?Sit to Stand: Min guard ?  ?  ?  ?  ?  ?General transfer comment: cues for positioning of L extremeties for pain management ?  ?  ?Balance Overall balance assessment: Needs assistance ?Sitting-balance support: Feet supported ?Sitting balance-Leahy Scale: Good ?  ?  ?Standing balance support: During functional activity ?Standing balance-Leahy Scale: Fair ?  ?  ?  ?  ?  ?  ?  ?  ?  ?  ?  ?  ?   ? ?ADL either performed or assessed with clinical judgement  ? ?ADL Overall ADL's : Needs assistance/impaired ?  ?  ?Grooming: Supervision/safety;Standing ?Grooming Details (indicate cue type and reason): at the sink, able to reach LUE to sink with incr time and effort ?  ?  ?  ?Lower Body Bathing Details (indicate cue type and reason): edcuated on using the danlge method to bathe L UE ?  ?Upper Body Dressing Details (indicate cue type and reason): edcuated on placing LUE into shirt first, taking out last ?  ?  ?Toilet Transfer: Min guard;Ambulation [facility]) ?  ?  ?  ?  ?  ?Functional mobility during ADLs: Min guard;Cane ?General ADL Comments: pt moving much better this date, continues to have a messy gait but no overt LOB. SPC in RUE. LUE limited functionally due to pain ?  ? ?Extremity/Trunk Assessment Upper Extremity Assessment ?Upper Extremity Assessment: LUE deficits/detail ?LUE  Deficits / Details: full range at elbow, wrist and hand. continues to have poor strength. Pt able to elevate shoulders, but unable to move otehrwise against gravity. ?LUE Sensation: WNL ?LUE Coordination: decreased gross motor ?  ?Lower Extremity Assessment ?Lower Extremity Assessment: Defer to PT evaluation ?  ?  ?   ? ?Vision   ?Vision Assessment?: No apparent visual deficits ?  ?Perception Perception ?Perception: Within Functional Limits ?  ?Praxis Praxis ?Praxis: Intact ?  ? ?Cognition Arousal/Alertness: Awake/alert ?Behavior During Therapy: [facility] for tasks assessed/performed ?Overall Cognitive Status: No family/caregiver present to determine baseline cognitive functioning ?Area of Impairment: Following commands, Safety/judgement, Awareness, Problem solving ?  ?  ?  ?  ?  ?  ?  ?  ?  ?  ?  ?Following Commands: Follows one step commands consistently ?Safety/Judgement: Decreased awareness of safety ?Awareness: Emergent ?Problem Solving: Slow processing ?General Comments: A&Ox4. continues to have limiterd insight to deficits and safety, requires simple cues. unable to carry out 3 step directional task. Adamant on d/c to home. ?  ?  ?   ?   ?   ?General Comments VSS on RA, pt adamant on d/c home  ? ? ?Pertinent Vitals/ Pain       Pain Assessment ?Pain Assessment: Faces ?Faces Pain Scale: Hurts a little bit ?Pain Location: L extremeties ?Pain Descriptors / Indicators: Guarding, Grimacing ?Pain Intervention(s): Limited activity within patient's tolerance, Monitored during session ? ? ?Frequency ? Min 2X/week  ? ? ? ? ?  ?Progress Toward Goals ? ?OT Goals(current goals can now be found in the care plan section) ? Progress towards OT goals: Progressing toward goals ? ?Acute Rehab OT Goals ?Patient Stated Goal: home ?OT Goal Formulation: With patient ?Potential to Achieve Goals: Good ?ADL Goals ?Pt Will Perform Grooming: with set-up;sitting ?Pt Will Perform Upper Body Dressing: with set-up;sitting ?Pt Will Perform Lower Body Dressing: with min guard assist;sit to/from stand ?Pt Will Transfer to Toilet: with min guard assist;ambulating ?Pt/caregiver will Perform Home Exercise Program: Increased ROM;Increased strength;Left upper extremity;Both right and left upper extremity;With written HEP provided  ?Plan Discharge plan needs to be  updated   ? ?   ?AM-PAC OT "6 Clicks" Daily Activity     ?Outcome Measure ? ? Help from another person eating meals?: None ?Help from another person taking care of personal grooming?: A Little ?Help from another person toileting, which includes using toliet, bedpan, or urinal?: A Little ?Help from another person bathing (including washing, rinsing, drying)?: A Little ?Help from another person to put on and taking off regular upper body clothing?: A Little ?Help from another person to put on and taking off regular lower body clothing?: A Little ?6 Click Score: 19 ? ?  ?End of Session Equipment Utilized During Treatment: Gait belt [facility]) ? ?OT Visit Diagnosis: Unsteadiness on feet (R26.81);Other abnormalities of gait and mobility (R26.89);Muscle weakness (generalized) (M62.81);Pain;Hemiplegia and hemiparesis ?Pain - Right/Left: Left ?  ?Activity Tolerance Patient tolerated treatment well ?  ?Patient Left in chair;with call bell/phone within reach;with chair alarm set ?  ?Nurse Communication Mobility status ?  ? ?   ? ?Time: 9735-3299 ?OT Time Calculation (min): 22 min ? ?Charges: OT General Charges ?$OT Visit: 1 Visit ? ? ? ?Nyesha Cliff A Jaeceon Michelin ?02/07/2022, 10:59 AM ?

## 2022-02-07 NOTE — Hospital Course (Addendum)
#  Left sided weakness, cerebral infarction ruled out ?#Left rotator cuff tear ?#Osteoarthritis ?Patient presented with left sided weakness, some mild left sided facial droop, and slight slurring/stuttering of his speech. The weakness has been ongoing for a few days, but the speech changes were first noticed this morning by the patient's brother. CT head in the ED with no acute intracranial abnormality. CTA head with moderate stenosis of R ICA and moderate to severe stenosis of L ICA. Also noted to have severe stenosis of the L ACA and severe stenosis within a R PCA branch. Neurology was consulted from the ED for further evaluation. Neurology had signed off after MRI Brain ruled out acute intracranial abnormality. MRI cervical spine was performed and showed severe facet arthropathy of C7-T1, although neurosurgery deemed that the patient is not a candidate for any interventions. With continued left shoulder pain, an MRI was obtained. Left shoulder is exquisitely tender and has very limited ROM, which very likely could be contributing to this patient's upper extremity weakness. MRI L shoulder showed complete tear of supraspinatus tender with retraction, small tear of infraspinatus, and moderate tear of subscapularis. The patient's hands also show moderate-severe degeneration in multiple joints as seen on xray, which is consistent with long-standing, untreated arthritis which could be also contributing to this patient's pain and subsequent weakness. Started patient on duloxetine for his neuropathic pain. He will require extensive outpatient physical therapy for rehabilitation of his shoulder.  ? ?#Left knee effusion s/p arthrocentesis  ?#Pseudogout ?Patient presented with severe left knee pain and generalized decreased movement on his left side. Knee xray did show a large effusion and tricompartmental osteoarthritis of the left knee. Left is now s/p arthrocentesis in the ED. Aspirated fluid showed >17,000 WBC although  culture is with no growth to date at 24 hrs- findings are consistent with pseudogout. Patient declined steroid injection as offered by ortho. Ortho recommended oral course of steroids in place of this and the patient was started on prednisone 40 mg x 3 days, followed by 30 mg x 3 days, then 20 mg x 3 days, and then 10 mg x 3 days. Patient will need to follow up with Dr. Aundria Rud (ortho) as an outpatient for both this, and his rotator cuff tear.  ? ?#CKD ?Unclear renal function at baseline, however, Cr increased from 1.28 to 1.74 during hospitalization. GFR 45. Renal ultrasound showed medical renal disease and no hydronephrosis or obstruction- suspect that the patient has underlying CKD.  ? ?#Hypertension ?Well controlled on home losartan 100 mg, amlodipine 10 mg, clonidine 0.1 mg bid, and metoprolol succinate 50 mg daily.  ? ?#Type 2 diabetes ?A1c 6.8, not on any medication.  ?  ?#Gout ?Has history of gout and reports that he usually gets flares in his big toes. On allopurinol 300 mg daily. Resumed this medication.  ?

## 2022-02-07 NOTE — TOC Transition Note (Signed)
Transition of Care (TOC) - CM/SW Discharge Note ? ? ?Patient Details  ?Name: Darryl Hurley ?MRN: [id number] ?Date of Birth: 1961/07/11 ? ?Transition of Care (TOC) CM/SW Contact:  ?Carles Collet, RN ?Phone Number: ?02/07/2022, 1:43 PM ? ? ?Clinical Narrative:    ?Spoke w patient and nurse at bedside. He states that he has a cane at home. He wants to get a shower seat through his doctor's office, although offered repeatedly to get him one before he leaves. He states that he is active with [facility] and will continue with therapy established prior to admission.  ? ? ? ?  ?Barriers to Discharge: Continued Medical Work up ? ? ?Patient Goals and CMS Choice ?  ?  ?Choice offered to / list presented to : Patient ? ?Discharge Placement ?  ?           ?  ?  ?  ?  ? ?Discharge Plan and Services ?  ?Discharge Planning Services: CM Consult ?Post Acute Care Choice: IP Rehab          ?  ?  ?  ?  ?  ?  ?  ?  ?  ?  ? ?Social Determinants of Health (SDOH) Interventions ?  ? ? ?Readmission Risk Interventions ?No flowsheet data found. ? ? ? ? ?

## 2022-02-07 NOTE — Progress Notes (Signed)
Physical Therapy Treatment ?Patient Details ?Name: Darryl Hurley ?MRN: [id number] ?DOB: 22-Aug-1961 ?Today's Date: 02/07/2022 ? ? ?History of Present Illness Darryl Hurley is a 61 y.o. male who presented to the ED with left sided pain&weakness, numbness, slightly slurred speech and ?facial droop. CT head with no intracranial abnormality, MRI demonstrated mild-to-moderate spinal canal stenosis at C3-C4; Multilevel uncovertebral and facet arthropathy, worst at C7-T1, where it is severe & left neural foraminal narrowing at every level in the cervical spine. PMH significant for alcohol abuse, anemia, angina pectoris, arthritis, asthma, chronic lower back pain, COPD, previously on 2L [place] at home, DM, HTN, previous hx of seizures and bilateral knee surgeries. ? ?  ?PT Comments  ? ? Continuing work on functional mobility and activity tolerance;  Darryl Hurley is making excellent progress. He was not as limited by pain this session. Overall he was min guard for functional ambulation and transfers; walked in the hallway with RW and then with cane; Reports he will have adequate assist at thome and that he has rides to Outpt PT; Updated dc plan to home with Outpt PT follow up; Notified Care Team via secure chat   ?Recommendations for follow up therapy are one component of a multi-disciplinary discharge planning process, led by the attending physician.  Recommendations may be updated based on patient status, additional functional criteria and insurance authorization. ? ?Follow Up Recommendations ? Outpatient PT ?  ?  ?Assistance Recommended at Discharge Intermittent Supervision/Assistance  ?Patient can return home with the following   ?  ?Equipment Recommendations ? Cane  ?  ?Recommendations for Other Services   ? ? ?  ?Precautions / Restrictions Precautions ?Precautions: Fall  ?  ? ?Mobility ? Bed Mobility ?  ?  ?  ?  ?  ?  ?  ?  ?  ? ?Transfers ?Overall transfer level: Needs assistance ?Equipment used: Rolling walker (2 wheels) ?   ?Sit to Stand: Min guard ?  ?  ?  ?  ?  ?General transfer comment: cues for positioning of L extremeties for pain management ?  ? ?Ambulation/Gait ?Ambulation/Gait assistance: Min guard (without physical contact) ?Gait Distance (Feet): 150 Feet ?Assistive device: Rolling walker (2 wheels), Straight cane ?Gait Pattern/deviations: Step-to pattern, Decreased step length - right, Decreased step length - left, Knee flexed in stance - left, Trunk flexed, Decreased dorsiflexion - left ?Gait velocity: decr ?  ?  ?General Gait Details: Better able to accept weight on L LE, though still antalgic; Trunk flexed, but able to stand more erect when cued; walked to the gym with RW and back to room with straight cane; no overt loss of balance ? ? ?Stairs ?Stairs: Yes ?Stairs assistance: Min guard ?Stair Management: One rail Right, One rail Left, With cane ?Number of Stairs: 3 ?General stair comments: slow, but able to manage ? ? ?Wheelchair Mobility ?  ? ?Modified Rankin (Stroke Patients Only) ?  ? ? ?  ?Balance   ?  ?Sitting balance-Leahy Scale: Good ?  ?  ?  ?Standing balance-Leahy Scale: Fair ?  ?  ?  ?  ?  ?  ?  ?  ?  ?  ?  ?  ?  ? ?  ?Cognition Arousal/Alertness: Awake/alert ?Behavior During Therapy: [facility] for tasks assessed/performed ?Overall Cognitive Status: No family/caregiver present to determine baseline cognitive functioning ?  ?  ?  ?  ?  ?  ?  ?  ?  ?  ?  ?  ?  ?  ?  ?  ?  General Comments: A&Ox4. continues to have limiterd insight to deficits and safety, requires simple cues. unable to carry out 3 step directional task. Adamant on d/c to home. ?  ?  ? ?  ?Exercises   ? ?  ?General Comments General comments (skin integrity, edema, etc.): VSS on RA, pt adamant on d/c home ?  ?  ? ?Pertinent Vitals/Pain Pain Assessment ?Pain Assessment: Faces ?Faces Pain Scale: Hurts a little bit ?Pain Location: L extremeties ?Pain Descriptors / Indicators: Guarding, Grimacing ?Pain Intervention(s): Monitored during session  ? ? ?Home  Living   ?  ?  ?  ?  ?  ?  ?  ?  ?  ?   ?  ?Prior Function    ?  ?  ?   ? ?PT Goals (current goals can now be found in the care plan section) Acute Rehab PT Goals ?Patient Stated Goal: go home ?PT Goal Formulation: With patient ?Time For Goal Achievement: 02/19/22 ?Potential to Achieve Goals: Good ?Progress towards PT goals: Progressing toward goals ? ?  ?Frequency ? ? ? Min 3X/week ? ? ? ?  ?PT Plan Discharge plan needs to be updated;Equipment recommendations need to be updated  ? ? ?Co-evaluation   ?  ?  ?  ?  ? ?  ?AM-PAC PT "6 Clicks" Mobility   ?Outcome Measure ? Help needed turning from your back to your side while in a flat bed without using bedrails?: None ?Help needed moving from lying on your back to sitting on the side of a flat bed without using bedrails?: None ?Help needed moving to and from a bed to a chair (including a wheelchair)?: None ?Help needed standing up from a chair using your arms (e.g., wheelchair or bedside chair)?: None ?Help needed to walk in hospital room?: A Little ?Help needed climbing 3-5 steps with a railing? : A Little ?6 Click Score: 22 ? ?  ?End of Session Equipment Utilized During Treatment: Gait belt ?Activity Tolerance: Patient tolerated treatment well ?Patient left: in chair;with call bell/phone within reach;with chair alarm set ?Nurse Communication: Mobility status ?PT Visit Diagnosis: Other abnormalities of gait and mobility (R26.89) ?  ? ? ?Time: 0950 (in and out times are approximate)-1010 ?PT Time Calculation (min) (ACUTE ONLY): 20 min ? ?Charges:  $Gait Training: 8-22 mins          ?          ? ?Van Clines, PT  ?Acute Rehabilitation Services ?Pager [phone] ?Office [phone] ? ? ? ?Darryl Hurley ?02/07/2022, 4:03 PM ? ?

## 2022-02-07 NOTE — Progress Notes (Signed)
Inpatient Rehab Admissions Coordinator:  ? ?Pt. Declining CIR, wishes to go home and do outpatient therapy. PT and OT agree with this plan. CIR will sign off. ? ?Megan Salon, MS, CCC-SLP ?Rehab Admissions Coordinator  ?[phone] (celll) ?[phone] (office) ? ?

## 2022-02-08 LAB — BODY FLUID CULTURE W GRAM STAIN: Culture: NO GROWTH

## 2022-02-21 ENCOUNTER — Ambulatory Visit: Payer: Medicaid Other | Admitting: Podiatry

## 2022-03-07 ENCOUNTER — Emergency Department (HOSPITAL_COMMUNITY)
Admission: EM | Admit: 2022-03-07 | Discharge: 2022-03-08 | Disposition: A | Discharge status: Home / Self Care | Payer: Medicaid Other | Attending: Emergency Medicine | Admitting: Emergency Medicine

## 2022-03-07 ENCOUNTER — Other Ambulatory Visit: Payer: Self-pay

## 2022-03-07 DIAGNOSIS — Z7982 Long term (current) use of aspirin: Secondary | ICD-10-CM | POA: Insufficient documentation

## 2022-03-07 DIAGNOSIS — Z79899 Other long term (current) drug therapy: Secondary | ICD-10-CM | POA: Diagnosis not present

## 2022-03-07 DIAGNOSIS — E119 Type 2 diabetes mellitus without complications: Secondary | ICD-10-CM | POA: Insufficient documentation

## 2022-03-07 DIAGNOSIS — F10129 Alcohol abuse with intoxication, unspecified: Secondary | ICD-10-CM | POA: Diagnosis present

## 2022-03-07 DIAGNOSIS — J45909 Unspecified asthma, uncomplicated: Secondary | ICD-10-CM | POA: Diagnosis not present

## 2022-03-07 DIAGNOSIS — J449 Chronic obstructive pulmonary disease, unspecified: Secondary | ICD-10-CM | POA: Diagnosis not present

## 2022-03-07 DIAGNOSIS — F10920 Alcohol use, unspecified with intoxication, uncomplicated: Secondary | ICD-10-CM

## 2022-03-07 DIAGNOSIS — Y908 Blood alcohol level of 240 mg/100 ml or more: Secondary | ICD-10-CM | POA: Diagnosis not present

## 2022-03-07 DIAGNOSIS — Z7951 Long term (current) use of inhaled steroids: Secondary | ICD-10-CM | POA: Insufficient documentation

## 2022-03-07 DIAGNOSIS — R944 Abnormal results of kidney function studies: Secondary | ICD-10-CM | POA: Diagnosis not present

## 2022-03-07 LAB — CBC WITH DIFFERENTIAL/PLATELET
Abs Immature Granulocytes: 0.02 10*3/uL (ref 0.00–0.07)
Basophils Absolute: 0.1 10*3/uL (ref 0.0–0.1)
Basophils Relative: 1 %
Eosinophils Absolute: 0.2 10*3/uL (ref 0.0–0.5)
Eosinophils Relative: 3 %
HCT: 42.6 % (ref 39.0–52.0)
Hemoglobin: 14.1 g/dL (ref 13.0–17.0)
Immature Granulocytes: 0 %
Lymphocytes Relative: 26 %
Lymphs Abs: 2 10*3/uL (ref 0.7–4.0)
MCH: 30.7 pg (ref 26.0–34.0)
MCHC: 33.1 g/dL (ref 30.0–36.0)
MCV: 92.6 fL (ref 80.0–100.0)
Monocytes Absolute: 0.7 10*3/uL (ref 0.1–1.0)
Monocytes Relative: 9 %
Neutro Abs: 4.7 10*3/uL (ref 1.7–7.7)
Neutrophils Relative %: 61 %
Platelets: 421 10*3/uL — ABNORMAL HIGH (ref 150–400)
RBC: 4.6 MIL/uL (ref 4.22–5.81)
RDW: 15.4 % (ref 11.5–15.5)
WBC: 7.7 10*3/uL (ref 4.0–10.5)
nRBC: 0 % (ref 0.0–0.2)

## 2022-03-07 NOTE — ED Provider Notes (Signed)
?[facility] DEPT ?Provider Note ? ? ?CSN: [id number] ?Arrival date & time: 03/07/22  2305 ? ?  ? ?History ? ?Chief Complaint  ?Patient presents with  ? Alcohol Intoxication  ? ? ?Darryl Hurley is a 61 y.o. male. ? ?The history is provided by the patient and medical records.  ?Alcohol Intoxication ? ?61 y.o. M with hx of alchol abuse, gout, asthma, COPD, cocaine abuse, prior stroke, DM2, presenting to the ED acutely intoxicated.  Wife reports that patient has 2 seizures at home which tends to happen when he drinks but she was intoxicated as well so unsure if this is true or not.   ? ?Home Medications ?Prior to Admission medications   ?Medication Sig Start Date End Date Taking? Authorizing Provider  ?acetaminophen (TYLENOL) 325 MG tablet Take 2 tablets (650 mg total) by mouth every 6 (six) hours as needed. ?Patient taking differently: Take 325 mg by mouth every 6 (six) hours as needed for moderate pain. 05/20/19   Lamptey, Myrene Galas, MD  ?albuterol (PROVENTIL HFA;VENTOLIN HFA) 108 (90 BASE) MCG/ACT inhaler Inhale 1 puff into the lungs every 6 (six) hours as needed for wheezing or shortness of breath.    [provider]  ?allopurinol (ZYLOPRIM) 300 MG tablet Take 300 mg by mouth daily. 02/02/21   [provider]  ?amLODipine (NORVASC) 10 MG tablet Take 10 mg by mouth daily. 08/06/18   [provider]  ?aspirin EC 81 MG tablet Take 1 tablet (81 mg total) by mouth daily. Swallow whole. ?Patient taking differently: Take 81 mg by mouth daily. 04/07/21   Patwardhan, Reynold Bowen, MD  ?cloNIDine (CATAPRES) 0.1 MG tablet Take 0.1 mg by mouth 2 (two) times daily. 08/15/18   [provider]  ?colchicine 0.6 MG tablet Take 0.6 mg by mouth daily as needed (gout flare). 08/15/20   [provider]  ?diclofenac Sodium (VOLTAREN) 1 % GEL Apply 2 g topically 4 (four) times daily as needed (For Knee Pain). 02/07/22   Dorethea Clan, DO  ?DULoxetine (CYMBALTA) 30 MG capsule  Take 1 capsule (30 mg total) by mouth daily. 02/08/22 03/10/22  Atway, Rayann N, DO  ?fluorometholone (FML) 0.1 % ophthalmic suspension Place 1 drop into both eyes daily. 12/01/21   [provider]  ?fluticasone (FLONASE) 50 MCG/ACT nasal spray Place 1 spray into both nostrils daily. 02/21/21   [provider]  ?gabapentin (NEURONTIN) 600 MG tablet Take 600 mg by mouth 3 (three) times daily. 09/12/20   [provider]  ?lidocaine (LIDODERM) 5 % Place 1 patch onto the skin daily. 02/21/21   [provider]  ?losartan (COZAAR) 100 MG tablet Take 100 mg by mouth daily. 08/13/20   [provider]  ?metoprolol succinate (TOPROL-XL) 50 MG 24 hr tablet Take 1 tablet (50 mg total) by mouth daily. Take with or immediately following a meal. ?Patient taking differently: Take 50 mg by mouth daily. 04/06/21 04/07/22  Patwardhan, Reynold Bowen, MD  ?nitroGLYCERIN (NITROSTAT) 0.4 MG SL tablet Place 1 tablet (0.4 mg total) under the tongue every 5 (five) minutes x 3 doses as needed for chest pain. 02/11/21   Joy, Shawn C, PA-C  ?oxyCODONE (OXY IR/ROXICODONE) 5 MG immediate release tablet Take 5 mg by mouth every 6 (six) hours as needed for severe pain.    [provider]  ?RESTASIS 0.05 % ophthalmic emulsion Place 1 drop into both eyes daily. 12/01/21   [provider]  ?rosuvastatin (CRESTOR) 40 MG tablet Take  40 mg by mouth daily.    [provider]  ?SYMBICORT 160-4.5 MCG/ACT inhaler Inhale 2 puffs into the lungs 2 (two) times daily. 11/05/19   [provider]  ?labetalol (NORMODYNE) 200 MG tablet Take 1 tablet (200 mg total) by mouth 2 (two) times daily. ?Patient not taking: Reported on 03/25/2019 02/16/19 05/20/19  Nigel Mormon, MD  ?   ? ?Allergies    ?Nsaids and Toradol [ketorolac tromethamine]   ? ?Review of Systems   ?Review of Systems  ?Unable to perform ROS: Other  ? ?Physical Exam ?Updated Vital Signs ?BP (!) 150/98 (BP Location: Left Arm)   Pulse 81    Temp 97.6 ?F (36.4 ?C) (Oral)   Resp 15   Ht 5\' 9"  (1.753 m)   Wt 81.6 kg   SpO2 100%   BMI 26.57 kg/m?  ? ?Physical Exam ?Vitals and nursing note reviewed.  ?Constitutional:   ?   Appearance: He is well-developed.  ?   Comments: Awake, intoxicated, breath smells heavily of EtOH  ?HENT:  ?   Head: Normocephalic and atraumatic.  ?Eyes:  ?   Conjunctiva/sclera: Conjunctivae normal.  ?   Pupils: Pupils are equal, round, and reactive to light.  ?Cardiovascular:  ?   Rate and Rhythm: Normal rate and regular rhythm.  ?   Heart sounds: Normal heart sounds.  ?Pulmonary:  ?   Effort: Pulmonary effort is normal. No respiratory distress.  ?   Breath sounds: Normal breath sounds. No rhonchi.  ?Abdominal:  ?   General: Bowel sounds are normal.  ?   Palpations: Abdomen is soft.  ?Musculoskeletal:     ?   General: Normal range of motion.  ?   Cervical back: Normal range of motion.  ?Skin: ?   General: Skin is warm and dry.  ?Neurological:  ?   Mental Status: He is alert.  ?   Comments: Intoxicated, no tremors or seizure activity noted, moving extremities spontaneously  ? ? ?ED Results / Procedures / Treatments   ?Labs ?(all labs ordered are listed, but only abnormal results are displayed) ?Labs Reviewed  ?ETHANOL - Abnormal; Notable for the following components:  ?    Result Value  ? Alcohol, Ethyl (B) 244 (*)   ? All other components within normal limits  ?CBC WITH DIFFERENTIAL/PLATELET - Abnormal; Notable for the following components:  ? Platelets 421 (*)   ? All other components within normal limits  ?COMPREHENSIVE METABOLIC PANEL - Abnormal; Notable for the following components:  ? Glucose, Bld 150 (*)   ? BUN 38 (*)   ? Creatinine, Ser 2.00 (*)   ? Total Protein 8.5 (*)   ? GFR, Estimated 38 (*)   ? All other components within normal limits  ?MAGNESIUM - Abnormal; Notable for the following components:  ? Magnesium 2.5 (*)   ? All other components within normal limits  ? ? ?EKG ?None ? ?Radiology ?No results  found. ? ?Procedures ?Procedures  ? ? ?Medications Ordered in ED ?Medications  ?acetaminophen (TYLENOL) tablet 1,000 mg (1,000 mg Oral Given 03/08/22 0342)  ? ? ?ED Course/ Medical Decision Making/ A&P ?  ?                        ?Medical Decision Making ?Amount and/or Complexity of Data Reviewed ?Labs: ordered. ? ?Risk ?OTC drugs. ? ? ?61 year old male presenting to the ED acutely intoxicated.  Wife at home reported seizures, however per EMS she  is severely intoxicated as well so unclear.  No other witnesses.  Patient is awake and alert on arrival, he does appear heavily intoxicated and breath smells of alcohol.  He is moving his extremities well without focal deficits.  I do not visualize any tremors or seizure activity.  Will check labs, monitor. ? ?Labs as above, ethanol 244.  Serum creatinine elevated but appears around his baseline when compared with prior values.  Patient was observed here in the ED for several hours and has gradually metabolized.  He was able to get up and ambulate to restroom with assistance as he does not currently have his cane.  Remains without tremors or seizure activity.  Stable for discharge.  Was able to call nephew on cell phone to come pick him up.  Encouraged to modify his alcohol intake as heavy drinking in this manner can be detrimental to his health.  Given resources for OP detox/rehab if desired.  Can follow-up with PCP.  Return here for new concerns. ? ?Final Clinical Impression(s) / ED Diagnoses ?Final diagnoses:  ?Alcoholic intoxication without complication ([place])  ? ? ?Rx / DC Orders ?ED Discharge Orders   ? ? None  ? ?  ? ? ?  ?Larene Pickett, PA-C ?03/08/22 0535 ? ?  ?Quintella Reichert, MD ?03/08/22 [phone] ? ?

## 2022-03-07 NOTE — ED Triage Notes (Signed)
Pt BIB EMS with reports of ETOH. Wife was on the scene intoxicated as well. Wife states that pt had 2 seizures and states that this happens when he drinks. Pt does not know what is going on.  ?

## 2022-03-08 ENCOUNTER — Encounter (HOSPITAL_COMMUNITY): Payer: Self-pay | Admitting: Emergency Medicine

## 2022-03-08 LAB — COMPREHENSIVE METABOLIC PANEL
ALT: 23 U/L (ref 0–44)
AST: 35 U/L (ref 15–41)
Albumin: 4.1 g/dL (ref 3.5–5.0)
Alkaline Phosphatase: 72 U/L (ref 38–126)
Anion gap: 13 (ref 5–15)
BUN: 38 mg/dL — ABNORMAL HIGH (ref 6–20)
CO2: 24 mmol/L (ref 22–32)
Calcium: 9.6 mg/dL (ref 8.9–10.3)
Chloride: 101 mmol/L (ref 98–111)
Creatinine, Ser: 2 mg/dL — ABNORMAL HIGH (ref 0.61–1.24)
GFR, Estimated: 38 mL/min — ABNORMAL LOW (ref >60)
Glucose, Bld: 150 mg/dL — ABNORMAL HIGH (ref 70–99)
Potassium: 3.8 mmol/L (ref 3.5–5.1)
Sodium: 138 mmol/L (ref 135–145)
Total Bilirubin: 0.5 mg/dL (ref 0.3–1.2)
Total Protein: 8.5 g/dL — ABNORMAL HIGH (ref 6.5–8.1)

## 2022-03-08 LAB — ETHANOL: Alcohol, Ethyl (B): 244 mg/dL — ABNORMAL HIGH (ref <10)

## 2022-03-08 LAB — MAGNESIUM: Magnesium: 2.5 mg/dL — ABNORMAL HIGH (ref 1.7–2.4)

## 2022-03-08 MED ORDER — ACETAMINOPHEN 500 MG PO TABS
1000.0000 mg | ORAL_TABLET | Freq: Once | ORAL | Status: AC
Start: 2022-03-08 — End: 2022-03-08
  Administered 2022-03-08: 1000 mg via ORAL
  Filled 2022-03-08: qty 2

## 2022-03-08 NOTE — Discharge Instructions (Addendum)
Please modify your drinking.  Drinking to excess is harmful to your health. ?I have attached resources for detox facility/rehab if desired. ?

## 2022-04-13 ENCOUNTER — Other Ambulatory Visit: Payer: Self-pay | Admitting: Cardiology

## 2022-04-13 DIAGNOSIS — I2510 Atherosclerotic heart disease of native coronary artery without angina pectoris: Secondary | ICD-10-CM

## 2022-04-19 ENCOUNTER — Other Ambulatory Visit: Payer: Self-pay | Admitting: Cardiology

## 2022-04-19 DIAGNOSIS — I2510 Atherosclerotic heart disease of native coronary artery without angina pectoris: Secondary | ICD-10-CM

## 2022-05-11 ENCOUNTER — Other Ambulatory Visit: Payer: Self-pay | Admitting: Cardiology

## 2022-05-11 DIAGNOSIS — I2510 Atherosclerotic heart disease of native coronary artery without angina pectoris: Secondary | ICD-10-CM

## 2022-05-17 ENCOUNTER — Ambulatory Visit: Payer: Medicaid Other | Admitting: Surgical

## 2022-05-25 ENCOUNTER — Ambulatory Visit: Payer: Medicaid Other | Admitting: Surgical

## 2022-06-08 ENCOUNTER — Other Ambulatory Visit: Payer: Self-pay | Admitting: Cardiology

## 2022-06-08 DIAGNOSIS — I2510 Atherosclerotic heart disease of native coronary artery without angina pectoris: Secondary | ICD-10-CM

## 2022-10-20 ENCOUNTER — Emergency Department (HOSPITAL_COMMUNITY)
Admission: EM | Admit: 2022-10-20 | Discharge: 2022-10-21 | Disposition: A | Discharge status: Home / Self Care | Payer: Medicaid Other | Attending: Emergency Medicine | Admitting: Emergency Medicine

## 2022-10-20 DIAGNOSIS — Z7982 Long term (current) use of aspirin: Secondary | ICD-10-CM | POA: Diagnosis not present

## 2022-10-20 DIAGNOSIS — F10921 Alcohol use, unspecified with intoxication delirium: Secondary | ICD-10-CM | POA: Diagnosis present

## 2022-10-20 DIAGNOSIS — R400 Somnolence: Secondary | ICD-10-CM | POA: Insufficient documentation

## 2022-10-20 DIAGNOSIS — Z7984 Long term (current) use of oral hypoglycemic drugs: Secondary | ICD-10-CM | POA: Insufficient documentation

## 2022-10-20 DIAGNOSIS — Y906 Blood alcohol level of 120-199 mg/100 ml: Secondary | ICD-10-CM | POA: Diagnosis not present

## 2022-10-20 LAB — BASIC METABOLIC PANEL
Anion gap: 12 (ref 5–15)
BUN: 25 mg/dL — ABNORMAL HIGH (ref 6–20)
CO2: 23 mmol/L (ref 22–32)
Calcium: 8.8 mg/dL — ABNORMAL LOW (ref 8.9–10.3)
Chloride: 103 mmol/L (ref 98–111)
Creatinine, Ser: 1.87 mg/dL — ABNORMAL HIGH (ref 0.61–1.24)
GFR, Estimated: 41 mL/min — ABNORMAL LOW (ref >60)
Glucose, Bld: 116 mg/dL — ABNORMAL HIGH (ref 70–99)
Potassium: 3.8 mmol/L (ref 3.5–5.1)
Sodium: 138 mmol/L (ref 135–145)

## 2022-10-20 LAB — CBC
HCT: 37.8 % — ABNORMAL LOW (ref 39.0–52.0)
Hemoglobin: 12.6 g/dL — ABNORMAL LOW (ref 13.0–17.0)
MCH: 31.8 pg (ref 26.0–34.0)
MCHC: 33.3 g/dL (ref 30.0–36.0)
MCV: 95.5 fL (ref 80.0–100.0)
Platelets: 256 10*3/uL (ref 150–400)
RBC: 3.96 MIL/uL — ABNORMAL LOW (ref 4.22–5.81)
RDW: 14.4 % (ref 11.5–15.5)
WBC: 8.2 10*3/uL (ref 4.0–10.5)
nRBC: 0 % (ref 0.0–0.2)

## 2022-10-20 NOTE — ED Triage Notes (Signed)
Patient BIB [facility] from home where his family state he had a seizure after drinking ETOH heavily today. Patient is alert, intoxicated, and in no apparent distress at this time.  BP 140/90, HR 90, CBG 200

## 2022-10-21 ENCOUNTER — Other Ambulatory Visit: Payer: Self-pay

## 2022-10-21 ENCOUNTER — Encounter (HOSPITAL_COMMUNITY): Payer: Self-pay

## 2022-10-21 ENCOUNTER — Emergency Department (HOSPITAL_COMMUNITY): Payer: Medicaid Other

## 2022-10-21 LAB — ETHANOL: Alcohol, Ethyl (B): 145 mg/dL — ABNORMAL HIGH (ref <10)

## 2022-10-21 NOTE — ED Provider Notes (Signed)
[facility] EMERGENCY DEPARTMENT Provider Note   CSN: [id number] Arrival date & time: 10/20/22  1738     History {Add pertinent medical, surgical, social history, OB history to HPI:1} Chief Complaint  Patient presents with   Seizures    Darryl Hurley is a 61 y.o. male.  Patient presents to the urgency department with possible seizure.  Patient's family reported at time of triage that patient had a seizure at home.  He has been heavily drinking today, has a history of chronic drinking.  Patient has had seizures in the past he drinks heavily.  At time of my evaluation, he is intoxicated, sleeping comfortably.  He has no complaints.       Home Medications Prior to Admission medications   Medication Sig Start Date End Date Taking? Authorizing Provider  fluticasone furoate-vilanterol (BREO ELLIPTA) 100-25 MCG/ACT AEPB Inhale into the lungs. 06/14/22  Yes [provider]  metFORMIN (GLUCOPHAGE) 500 MG tablet Take by mouth. 07/02/22  Yes [provider]  acetaminophen (TYLENOL) 325 MG tablet Take 2 tablets (650 mg total) by mouth every 6 (six) hours as needed. Patient taking differently: Take 325 mg by mouth every 6 (six) hours as needed for moderate pain. 05/20/19   Lamptey, Britta Mccreedy, MD  albuterol (PROVENTIL HFA;VENTOLIN HFA) 108 (90 BASE) MCG/ACT inhaler Inhale 1 puff into the lungs every 6 (six) hours as needed for wheezing or shortness of breath.    [provider]  allopurinol (ZYLOPRIM) 300 MG tablet Take 300 mg by mouth daily. 02/02/21   [provider]  amLODipine (NORVASC) 10 MG tablet Take 10 mg by mouth daily. 08/06/18   [provider]  ASPIRIN LOW DOSE 81 MG tablet Take 1 tablet (81 mg total) by mouth daily. Swallow whole. 06/08/22   Patwardhan, Anabel Bene, MD  cloNIDine (CATAPRES) 0.1 MG tablet Take 0.1 mg by mouth 2 (two) times daily. 08/15/18   [provider]  colchicine 0.6 MG tablet Take 0.6 mg by mouth  daily as needed (gout flare). 08/15/20   [provider]  diclofenac Sodium (VOLTAREN) 1 % GEL Apply 2 g topically 4 (four) times daily as needed (For Knee Pain). 02/07/22   Atway, Rayann N, DO  DULoxetine (CYMBALTA) 30 MG capsule Take 1 capsule (30 mg total) by mouth daily. 02/08/22 03/10/22  Atway, Rayann N, DO  fluorometholone (FML) 0.1 % ophthalmic suspension Place 1 drop into both eyes daily. 12/01/21   [provider]  fluticasone (FLONASE) 50 MCG/ACT nasal spray Place 1 spray into both nostrils daily. 02/21/21   [provider]  gabapentin (NEURONTIN) 600 MG tablet Take 600 mg by mouth 3 (three) times daily. 09/12/20   [provider]  HYDROcodone-acetaminophen (NORCO/VICODIN) 5-325 MG tablet Take by mouth.    [provider]  lidocaine (LIDODERM) 5 % Place 1 patch onto the skin daily. 02/21/21   [provider]  losartan (COZAAR) 100 MG tablet Take 100 mg by mouth daily. 08/13/20   [provider]  metoprolol succinate (TOPROL-XL) 50 MG 24 hr tablet Take 1 tablet (50 mg total) by mouth daily. Take with or immediately following a meal. Patient taking differently: Take 50 mg by mouth daily. 04/06/21 04/07/22  Patwardhan, Anabel Bene, MD  metoprolol tartrate (LOPRESSOR) 50 MG tablet Take 50 mg by mouth 2 (two) times daily.    [provider]  nitroGLYCERIN (NITROSTAT) 0.4 MG SL tablet Place 1 tablet (0.4 mg total) under the tongue every 5 (five)  minutes x 3 doses as needed for chest pain. 02/11/21   Joy, Shawn C, PA-C  oxyCODONE (OXY IR/ROXICODONE) 5 MG immediate release tablet Take 5 mg by mouth every 6 (six) hours as needed for severe pain.    [provider]  pantoprazole (PROTONIX) 40 MG tablet Take 40 mg by mouth daily.    [provider]  RESTASIS 0.05 % ophthalmic emulsion Place 1 drop into both eyes daily. 12/01/21   [provider]  rosuvastatin (CRESTOR) 40 MG tablet Take 40 mg by mouth daily.    [provider]  SYMBICORT 160-4.5 MCG/ACT inhaler Inhale 2 puffs into the lungs 2 (two) times daily. 11/05/19   [provider]  labetalol (NORMODYNE) 200 MG tablet Take 1 tablet (200 mg total) by mouth 2 (two) times daily. Patient not taking: Reported on 03/25/2019 02/16/19 05/20/19  Elder Negus, MD      Allergies    Nsaids and Toradol [ketorolac tromethamine]    Review of Systems   Review of Systems  Physical Exam Updated Vital Signs BP (!) 141/87 (BP Location: Right Arm)   Pulse 73   Temp 97.9 F (36.6 C) (Oral)   Resp 18   SpO2 95%  Physical Exam Vitals and nursing note reviewed.  Constitutional:      General: He is not in acute distress.    Appearance: He is well-developed.  HENT:     Head: Normocephalic and atraumatic.     Mouth/Throat:     Mouth: Mucous membranes are moist.  Eyes:     General: Vision grossly intact. Gaze aligned appropriately.     Extraocular Movements: Extraocular movements intact.     Conjunctiva/sclera: Conjunctivae normal.  Cardiovascular:     Rate and Rhythm: Normal rate and regular rhythm.     Pulses: Normal pulses.     Heart sounds: Normal heart sounds, S1 normal and S2 normal. No murmur heard.    No friction rub. No gallop.  Pulmonary:     Effort: Pulmonary effort is normal. No respiratory distress.     Breath sounds: Normal breath sounds.  Abdominal:     Palpations: Abdomen is soft.     Tenderness: There is no abdominal tenderness. There is no guarding or rebound.     Hernia: No hernia is present.  Musculoskeletal:        General: No swelling.     Cervical back: Full passive range of motion without pain, normal range of motion and neck supple. No pain with movement, spinous process tenderness or muscular tenderness. Normal range of motion.     Right lower leg: No edema.     Left lower leg: No edema.  Skin:    General: Skin is warm and dry.     Capillary Refill: Capillary refill takes less than 2 seconds.     Findings:  No ecchymosis, erythema, lesion or wound.  Neurological:     Cranial Nerves: Cranial nerves 2-12 are intact.     Sensory: Sensation is intact.     Motor: Motor function is intact. No weakness or abnormal muscle tone.     Coordination: Coordination is intact.     Comments: Somnolent, no focal deficit  Psychiatric:        Mood and Affect: Mood normal.        Speech: Speech is slurred.        Behavior: Behavior normal.     ED Results / Procedures / Treatments   Labs (all labs  ordered are listed, but only abnormal results are displayed) Labs Reviewed  BASIC METABOLIC PANEL - Abnormal; Notable for the following components:      Result Value   Glucose, Bld 116 (*)    BUN 25 (*)    Creatinine, Ser 1.87 (*)    Calcium 8.8 (*)    GFR, Estimated 41 (*)    All other components within normal limits  CBC - Abnormal; Notable for the following components:   RBC 3.96 (*)    Hemoglobin 12.6 (*)    HCT 37.8 (*)    All other components within normal limits  ETHANOL    EKG None  Radiology CT HEAD WO CONTRAST (5MM)  Result Date: 10/21/2022 CLINICAL DATA:  61 year old male with history of altered mental status. EXAM: CT HEAD WITHOUT CONTRAST TECHNIQUE: Contiguous axial images were obtained from the base of the skull through the vertex without intravenous contrast. RADIATION DOSE REDUCTION: This exam was performed according to the departmental dose-optimization program which includes automated exposure control, adjustment of the mA and/or kV according to patient size and/or use of iterative reconstruction technique. COMPARISON:  Head CT 02/04/2022. FINDINGS: Brain: Patchy and confluent areas of decreased attenuation are noted throughout the deep and periventricular white matter of the cerebral hemispheres bilaterally, compatible with chronic microvascular ischemic disease. No evidence of acute infarction, hemorrhage, hydrocephalus, extra-axial collection or mass lesion/mass effect. Vascular: No  hyperdense vessel or unexpected calcification. Skull: Normal. Negative for fracture or focal lesion. Sinuses/Orbits: No acute finding. Old fracture of the medial left orbital wall with chronic posttraumatic deformity of the left lamina papyracea and obscuration of several left ethmoid sinuses, similar to the prior study. Other: None. IMPRESSION: 1. No acute intracranial abnormalities. 2. Chronic microvascular ischemic changes in the cerebral white matter, similar to the prior study, as above. Electronically Signed   By: Vinnie Langton M.D.   On: 10/21/2022 05:40    Procedures Procedures  {Document cardiac monitor, telemetry assessment procedure when appropriate:1}  Medications Ordered in ED Medications - No data to display  ED Course/ Medical Decision Making/ A&P                           Medical Decision Making Amount and/or Complexity of Data Reviewed Independent Historian: spouse External Data Reviewed: labs, ECG and notes. Labs: ordered. Decision-making details documented in ED Course. Radiology: ordered and independent interpretation performed. Decision-making details documented in ED Course.   To the emergency department by family after he reportedly had a seizure at home.  Reviewing his records, this has happened in the past.  He has not had witnessed seizures here in the emergency department but has reportedly had seizures at home when he becomes very intoxicated.  He is not on any antiseizure medications.  Patient obviously intoxicated on arrival.  He has been monitored through the night.  Patient has done well.  He went from fairly intoxicated to now awake and alert.  Work-up unremarkable.  Unclear if this was a true seizure, if it was it was provoked by alcohol, likely does not require medication.  Discussed with on-call neurology, agree with not starting antiepileptics.    Recommend patient get help for his alcohol abuse.  {Document critical care time when  appropriate:1} {Document review of labs and clinical decision tools ie heart score, Chads2Vasc2 etc:1}  {Document your independent review of radiology images, and any outside records:1} {Document your discussion with family members, caretakers, and with consultants:1} {Document  social determinants of health affecting pt's care:1} {Document your decision making why or why not admission, treatments were needed:1} Final Clinical Impression(s) / ED Diagnoses Final diagnoses:  Alcohol intoxication with delirium ([place])    Rx / DC Orders ED Discharge Orders     None

## 2022-10-21 NOTE — ED Notes (Signed)
Fiance Ellouise Newer [phone] wants an update immediately or she's coming up here!

## 2022-11-07 ENCOUNTER — Emergency Department (HOSPITAL_COMMUNITY)
Admission: EM | Admit: 2022-11-07 | Discharge: 2022-11-08 | Disposition: A | Discharge status: Home / Self Care | Payer: Medicaid Other | Attending: Emergency Medicine | Admitting: Emergency Medicine

## 2022-11-07 ENCOUNTER — Other Ambulatory Visit: Payer: Self-pay

## 2022-11-07 ENCOUNTER — Emergency Department (HOSPITAL_COMMUNITY): Payer: Medicaid Other

## 2022-11-07 DIAGNOSIS — Z1152 Encounter for screening for COVID-19: Secondary | ICD-10-CM | POA: Insufficient documentation

## 2022-11-07 DIAGNOSIS — I129 Hypertensive chronic kidney disease with stage 1 through stage 4 chronic kidney disease, or unspecified chronic kidney disease: Secondary | ICD-10-CM | POA: Insufficient documentation

## 2022-11-07 DIAGNOSIS — N1832 Chronic kidney disease, stage 3b: Secondary | ICD-10-CM

## 2022-11-07 DIAGNOSIS — I10 Essential (primary) hypertension: Secondary | ICD-10-CM

## 2022-11-07 DIAGNOSIS — J069 Acute upper respiratory infection, unspecified: Secondary | ICD-10-CM | POA: Diagnosis not present

## 2022-11-07 DIAGNOSIS — R059 Cough, unspecified: Secondary | ICD-10-CM | POA: Diagnosis present

## 2022-11-07 NOTE — ED Provider Triage Note (Signed)
  Emergency Medicine Provider Triage Evaluation Note  MRN:  [id number]  Arrival date & time: 11/07/22    Medically screening exam initiated at 11:47 PM.   CC:   Cough   HPI:  Darryl Hurley is a 61 y.o. year-old male presents to the ED with chief complaint of cough and congestion.  Reports cough up some blood.  Hx of COPD.  Reports bad headache as well.  History provided by patient. ROS:  -As included in HPI PE:   Vitals:   11/07/22 2307  BP: (!) 150/83  Pulse: 79  Resp: 20  Temp: 98.6 F (37 C)  SpO2: 91%    Non-toxic appearing No respiratory distress  MDM:  Based on signs and symptoms, flue is highest on my differential, followed by URI. I've ordered labs and imaging in triage to expedite lab/diagnostic workup.  Patient was informed that the remainder of the evaluation will be completed by another provider, this initial triage assessment does not replace that evaluation, and the importance of remaining in the ED until their evaluation is complete.    Deshon, Hsiao, PA-C 11/08/22 [phone]

## 2022-11-07 NOTE — ED Triage Notes (Signed)
BIB [facility] from home called out for "coughing up blood"  Pt has COPD.  On arrival reports headache as well.  VS 93%, 96.8, 160/84, HR 95.

## 2022-11-08 LAB — BASIC METABOLIC PANEL
Anion gap: 13 (ref 5–15)
BUN: 25 mg/dL — ABNORMAL HIGH (ref 6–20)
CO2: 21 mmol/L — ABNORMAL LOW (ref 22–32)
Calcium: 9 mg/dL (ref 8.9–10.3)
Chloride: 110 mmol/L (ref 98–111)
Creatinine, Ser: 1.52 mg/dL — ABNORMAL HIGH (ref 0.61–1.24)
GFR, Estimated: 52 mL/min — ABNORMAL LOW (ref >60)
Glucose, Bld: 116 mg/dL — ABNORMAL HIGH (ref 70–99)
Potassium: 3.6 mmol/L (ref 3.5–5.1)
Sodium: 144 mmol/L (ref 135–145)

## 2022-11-08 LAB — TROPONIN I (HIGH SENSITIVITY)
Troponin I (High Sensitivity): 11 ng/L (ref <18)
Troponin I (High Sensitivity): 10 ng/L (ref <18)

## 2022-11-08 LAB — RESP PANEL BY RT-PCR (FLU A&B, COVID) ARPGX2
Influenza A by PCR: NEGATIVE
Influenza B by PCR: NEGATIVE
SARS Coronavirus 2 by RT PCR: NEGATIVE

## 2022-11-08 LAB — CBC
HCT: 37.7 % — ABNORMAL LOW (ref 39.0–52.0)
Hemoglobin: 12.3 g/dL — ABNORMAL LOW (ref 13.0–17.0)
MCH: 31.2 pg (ref 26.0–34.0)
MCHC: 32.6 g/dL (ref 30.0–36.0)
MCV: 95.7 fL (ref 80.0–100.0)
Platelets: 289 10*3/uL (ref 150–400)
RBC: 3.94 MIL/uL — ABNORMAL LOW (ref 4.22–5.81)
RDW: 14.7 % (ref 11.5–15.5)
WBC: 6.8 10*3/uL (ref 4.0–10.5)
nRBC: 0 % (ref 0.0–0.2)

## 2022-11-08 LAB — ETHANOL: Alcohol, Ethyl (B): 15 mg/dL — ABNORMAL HIGH (ref <10)

## 2022-11-08 MED ORDER — ACETAMINOPHEN 500 MG PO TABS
1000.0000 mg | ORAL_TABLET | Freq: Once | ORAL | Status: AC
  Administered 2022-11-08: 1000 mg via ORAL
  Filled 2022-11-08: qty 2

## 2022-11-08 NOTE — ED Notes (Signed)
Called micro. Per [facility] COVID swab received at 0920 and running on the machine.

## 2022-11-08 NOTE — ED Notes (Signed)
Patient given [place] sandwhich and water at this time.

## 2022-11-08 NOTE — Discharge Instructions (Addendum)
It was our pleasure to provide your ER care today - we hope that you feel better.  Your covid and flu tests are negative.    Drink plenty of fluids/stay well hydrated. Avoid any smoking. You may try mucinex, nyquil or similar over the counter medication for symptom relief.   Your blood pressure is high today - continue meds, limit salt intake - for recent symptoms as well as your blood pressure, follow up closely with primary care doctor in the next couple weeks.  Return to ER if worse, new symptoms, high fevers, chest pain, trouble breathing, or other emergency concern.

## 2022-11-08 NOTE — ED Notes (Signed)
Pt in hall bed at this time. Covid swab obtained and resent to lab.

## 2022-11-08 NOTE — ED Provider Notes (Signed)
[facility] EMERGENCY DEPARTMENT Provider Note   CSN: [id number] Arrival date & time: 11/07/22  2300     History  Chief Complaint  Patient presents with   Cough    Darryl Hurley is a 61 y.o. male.  Pt c/o non prod cough, nasal congestion/runny nose, in  the past few days. Symptoms acute onset, moderate, persistent. No specific known ill contacts. No fevers. No chest pain or sob. No abd pain or nvd. No extremity pain or swelling.   The history is provided by the patient and medical records.  Cough Associated symptoms: rhinorrhea   Associated symptoms: no chest pain, no chills, no fever, no headaches, no rash, no shortness of breath and no sore throat        Home Medications Prior to Admission medications   Medication Sig Start Date End Date Taking? Authorizing Provider  acetaminophen (TYLENOL) 325 MG tablet Take 2 tablets (650 mg total) by mouth every 6 (six) hours as needed. Patient taking differently: Take 325 mg by mouth every 6 (six) hours as needed for moderate pain. 05/20/19   Lamptey, Myrene Galas, MD  albuterol (PROVENTIL HFA;VENTOLIN HFA) 108 (90 BASE) MCG/ACT inhaler Inhale 1 puff into the lungs every 6 (six) hours as needed for wheezing or shortness of breath.    [provider]  allopurinol (ZYLOPRIM) 300 MG tablet Take 300 mg by mouth daily. 02/02/21   [provider]  amLODipine (NORVASC) 10 MG tablet Take 10 mg by mouth daily. 08/06/18   [provider]  ASPIRIN LOW DOSE 81 MG tablet Take 1 tablet (81 mg total) by mouth daily. Swallow whole. 06/08/22   Patwardhan, Reynold Bowen, MD  cloNIDine (CATAPRES) 0.1 MG tablet Take 0.1 mg by mouth 2 (two) times daily. 08/15/18   [provider]  colchicine 0.6 MG tablet Take 0.6 mg by mouth daily as needed (gout flare). 08/15/20   [provider]  diclofenac Sodium (VOLTAREN) 1 % GEL Apply 2 g topically 4 (four) times daily as needed (For Knee Pain). 02/07/22   Atway, Rayann N, DO   DULoxetine (CYMBALTA) 30 MG capsule Take 1 capsule (30 mg total) by mouth daily. 02/08/22 03/10/22  Atway, Rayann N, DO  fluorometholone (FML) 0.1 % ophthalmic suspension Place 1 drop into both eyes daily. 12/01/21   [provider]  fluticasone (FLONASE) 50 MCG/ACT nasal spray Place 1 spray into both nostrils daily. 02/21/21   [provider]  fluticasone furoate-vilanterol (BREO ELLIPTA) 100-25 MCG/ACT AEPB Inhale into the lungs. 06/14/22   [provider]  gabapentin (NEURONTIN) 600 MG tablet Take 600 mg by mouth 3 (three) times daily. 09/12/20   [provider]  HYDROcodone-acetaminophen (NORCO/VICODIN) 5-325 MG tablet Take by mouth.    [provider]  lidocaine (LIDODERM) 5 % Place 1 patch onto the skin daily. 02/21/21   [provider]  losartan (COZAAR) 100 MG tablet Take 100 mg by mouth daily. 08/13/20   [provider]  metFORMIN (GLUCOPHAGE) 500 MG tablet Take by mouth. 07/02/22   [provider]  metoprolol succinate (TOPROL-XL) 50 MG 24 hr tablet Take 1 tablet (50 mg total) by mouth daily. Take with or immediately following a meal. Patient taking differently: Take 50 mg by mouth daily. 04/06/21 04/07/22  Patwardhan, Reynold Bowen, MD  metoprolol tartrate (LOPRESSOR) 50 MG tablet Take 50 mg by mouth 2 (two) times daily.    [provider]  nitroGLYCERIN (NITROSTAT) 0.4 MG SL tablet Place 1 tablet (  0.4 mg total) under the tongue every 5 (five) minutes x 3 doses as needed for chest pain. 02/11/21   Joy, Shawn C, PA-C  oxyCODONE (OXY IR/ROXICODONE) 5 MG immediate release tablet Take 5 mg by mouth every 6 (six) hours as needed for severe pain.    [provider]  pantoprazole (PROTONIX) 40 MG tablet Take 40 mg by mouth daily.    [provider]  RESTASIS 0.05 % ophthalmic emulsion Place 1 drop into both eyes daily. 12/01/21   [provider]  rosuvastatin (CRESTOR) 40 MG tablet Take 40 mg by mouth  daily.    [provider]  SYMBICORT 160-4.5 MCG/ACT inhaler Inhale 2 puffs into the lungs 2 (two) times daily. 11/05/19   [provider]  labetalol (NORMODYNE) 200 MG tablet Take 1 tablet (200 mg total) by mouth 2 (two) times daily. Patient not taking: Reported on 03/25/2019 02/16/19 05/20/19  Nigel Mormon, MD      Allergies    Nsaids and Toradol [ketorolac tromethamine]    Review of Systems   Review of Systems  Constitutional:  Negative for chills and fever.  HENT:  Positive for congestion and rhinorrhea. Negative for sore throat.   Eyes:  Negative for redness.  Respiratory:  Positive for cough. Negative for shortness of breath.   Cardiovascular:  Negative for chest pain and leg swelling.  Gastrointestinal:  Negative for abdominal pain, diarrhea and vomiting.  Genitourinary:  Negative for dysuria and flank pain.  Musculoskeletal:  Negative for neck pain and neck stiffness.  Skin:  Negative for rash.  Neurological:  Negative for headaches.  Hematological:  Does not bruise/bleed easily.  Psychiatric/Behavioral:  Negative for confusion.     Physical Exam Updated Vital Signs BP (!) 157/84 (BP Location: Left Arm)   Pulse 64   Temp 98.2 F (36.8 C)   Resp 19   SpO2 96%  Physical Exam Vitals and nursing note reviewed.  Constitutional:      Appearance: Normal appearance. He is well-developed.  HENT:     Head: Atraumatic.     Comments: No sinus or temporal tenderness.     Nose: Congestion and rhinorrhea present.     Mouth/Throat:     Mouth: Mucous membranes are moist.     Pharynx: Oropharynx is clear. No oropharyngeal exudate or posterior oropharyngeal erythema.  Eyes:     General: No scleral icterus.    Conjunctiva/sclera: Conjunctivae normal.     Pupils: Pupils are equal, round, and reactive to light.  Neck:     Trachea: No tracheal deviation.     Comments: No stiffness or rigidity.  Cardiovascular:     Rate and Rhythm: Normal rate and regular  rhythm.     Pulses: Normal pulses.     Heart sounds: Normal heart sounds. No murmur heard.    No friction rub. No gallop.  Pulmonary:     Effort: Pulmonary effort is normal. No accessory muscle usage or respiratory distress.     Breath sounds: Normal breath sounds.     Comments: Non prod cough noted.  Abdominal:     General: Bowel sounds are normal. There is no distension.     Palpations: Abdomen is soft.     Tenderness: There is no abdominal tenderness.  Genitourinary:    Comments: No cva tenderness. Musculoskeletal:        General: No swelling or tenderness.     Cervical back: Normal range of motion and neck supple. No rigidity.  Right lower leg: No edema.     Left lower leg: No edema.  Lymphadenopathy:     Cervical: No cervical adenopathy.  Skin:    General: Skin is warm and dry.     Findings: No rash.  Neurological:     Mental Status: He is alert.     Comments: Alert, speech clear. Motor/sens grossly intact. Steady gait.   Psychiatric:        Mood and Affect: Mood normal.     ED Results / Procedures / Treatments   Labs (all labs ordered are listed, but only abnormal results are displayed) Results for orders placed or performed during the hospital encounter of 11/07/22  Resp Panel by RT-PCR (Flu A&B, Covid) Anterior Nasal Swab   Specimen: Anterior Nasal Swab  Result Value Ref Range   SARS Coronavirus 2 by RT PCR NEGATIVE NEGATIVE   Influenza A by PCR NEGATIVE NEGATIVE   Influenza B by PCR NEGATIVE NEGATIVE  Basic metabolic panel  Result Value Ref Range   Sodium 144 135 - 145 mmol/L   Potassium 3.6 3.5 - 5.1 mmol/L   Chloride 110 98 - 111 mmol/L   CO2 21 (L) 22 - 32 mmol/L   Glucose, Bld 116 (H) 70 - 99 mg/dL   BUN 25 (H) 6 - 20 mg/dL   Creatinine, Ser 1.61 (H) 0.61 - 1.24 mg/dL   Calcium 9.0 8.9 - 09.6 mg/dL   GFR, Estimated 52 (L) >60 mL/min   Anion gap 13 5 - 15  CBC  Result Value Ref Range   WBC 6.8 4.0 - 10.5 K/uL   RBC 3.94 (L) 4.22 - 5.81 MIL/uL    Hemoglobin 12.3 (L) 13.0 - 17.0 g/dL   HCT 04.5 (L) 40.9 - 81.1 %   MCV 95.7 80.0 - 100.0 fL   MCH 31.2 26.0 - 34.0 pg   MCHC 32.6 30.0 - 36.0 g/dL   RDW 91.4 78.2 - 95.6 %   Platelets 289 150 - 400 K/uL   nRBC 0.0 0.0 - 0.2 %  Ethanol  Result Value Ref Range   Alcohol, Ethyl (B) 15 (H) <10 mg/dL  Troponin I (High Sensitivity)  Result Value Ref Range   Troponin I (High Sensitivity) 11 <18 ng/L  Troponin I (High Sensitivity)  Result Value Ref Range   Troponin I (High Sensitivity) 10 <18 ng/L   DG Chest 2 View  Result Date: 11/08/2022 CLINICAL DATA:  Shortness of breath. EXAM: CHEST - 2 VIEW COMPARISON:  Chest radiograph dated 02/04/2022. FINDINGS: No focal consolidation, pleural effusion, or pneumothorax. The cardiac silhouette is within limits. No acute osseous pathology. Degenerative changes of spine. IMPRESSION: No active cardiopulmonary disease. Electronically Signed   By: Elgie Collard M.D.   On: 11/08/2022 00:47   CT HEAD WO CONTRAST ( )  Result Date: 10/21/2022 CLINICAL DATA:  61 year old male with history of altered mental status. EXAM: CT HEAD WITHOUT CONTRAST TECHNIQUE: Contiguous axial images were obtained from the base of the skull through the vertex without intravenous contrast. RADIATION DOSE REDUCTION: This exam was performed according to the departmental dose-optimization program which includes automated exposure control, adjustment of the mA and/or kV according to patient size and/or use of iterative reconstruction technique. COMPARISON:  Head CT 02/04/2022. FINDINGS: Brain: Patchy and confluent areas of decreased attenuation are noted throughout the deep and periventricular white matter of the cerebral hemispheres bilaterally, compatible with chronic microvascular ischemic disease. No evidence of acute infarction, hemorrhage, hydrocephalus, extra-axial collection or mass lesion/mass effect. Vascular:  No hyperdense vessel or unexpected calcification. Skull: Normal.  Negative for fracture or focal lesion. Sinuses/Orbits: No acute finding. Old fracture of the medial left orbital wall with chronic posttraumatic deformity of the left lamina papyracea and obscuration of several left ethmoid sinuses, similar to the prior study. Other: None. IMPRESSION: 1. No acute intracranial abnormalities. 2. Chronic microvascular ischemic changes in the cerebral white matter, similar to the prior study, as above. Electronically Signed   By: Vinnie Langton M.D.   On: 10/21/2022 05:40     EKG EKG Interpretation  Date/Time:  Wednesday November 07 2022 23:24:43 EST Ventricular Rate:  85 PR Interval:  142 QRS Duration: 84 QT Interval:  368 QTC Calculation: 437 R Axis:   23 Text Interpretation: Normal sinus rhythm Non-specific ST-t changes No significant change since last tracing Confirmed by Lajean Saver [id number]) on 11/08/2022 7:02:56 AM  Radiology DG Chest 2 View  Result Date: 11/08/2022 CLINICAL DATA:  Shortness of breath. EXAM: CHEST - 2 VIEW COMPARISON:  Chest radiograph dated 02/04/2022. FINDINGS: No focal consolidation, pleural effusion, or pneumothorax. The cardiac silhouette is within limits. No acute osseous pathology. Degenerative changes of spine. IMPRESSION: No active cardiopulmonary disease. Electronically Signed   By: Anner Crete M.D.   On: 11/08/2022 00:47    Procedures Procedures    Medications Ordered in ED Medications - No data to display  ED Course/ Medical Decision Making/ A&P                           Medical Decision Making Problems Addressed: Essential hypertension: chronic illness or injury with exacerbation, progression, or side effects of treatment that poses a threat to life or bodily functions Stage 3b chronic kidney disease ([place]): chronic illness or injury that poses a threat to life or bodily functions Viral URI with cough: acute illness or injury with systemic symptoms that poses a threat to life or bodily functions  Amount and/or  Complexity of Data Reviewed Independent Historian: EMS    Details: hx External Data Reviewed: notes. Labs: ordered. Decision-making details documented in ED Course. Radiology: ordered and independent interpretation performed. Decision-making details documented in ED Course. ECG/medicine tests: ordered and independent interpretation performed. Decision-making details documented in ED Course.  Risk OTC drugs. Decision regarding hospitalization.   Iv ns. Continuous pulse ox and cardiac monitoring. Labs ordered/sent. Imaging ordered.   Diff dx includes uri, covid, pna, etc - dispo decision including potential need for admission considered if significant pna - will get labs and imaging and reassess.   Reviewed nursing notes and prior charts for additional history. External reports reviewed. Additional history from: EMS  Cardiac monitor: sinus rhythm, rate 66.  Labs reviewed/interpreted by me - wbc normal. Trop normal x 2. Covid neg.   Xrays reviewed/interpreted by me - no pna.   Pt asking for food/drink - provided.   Acetaminophen po.   No increased wob. No distress. Pt comfortable appearing.   Pt currently appears stable for d/c.   Return precautions provided.           Final Clinical Impression(s) / ED Diagnoses Final diagnoses:  None    Rx / DC Orders ED Discharge Orders     None         Lajean Saver, MD 11/08/22 1041

## 2023-01-07 ENCOUNTER — Other Ambulatory Visit: Payer: Self-pay

## 2023-01-07 ENCOUNTER — Encounter (HOSPITAL_COMMUNITY): Payer: Self-pay

## 2023-01-07 ENCOUNTER — Emergency Department (HOSPITAL_COMMUNITY)
Admission: EM | Admit: 2023-01-07 | Discharge: 2023-01-07 | Disposition: A | Discharge status: Home / Self Care | Payer: Medicaid Other | Attending: Emergency Medicine | Admitting: Emergency Medicine

## 2023-01-07 ENCOUNTER — Emergency Department (HOSPITAL_COMMUNITY): Payer: Medicaid Other

## 2023-01-07 DIAGNOSIS — Z7982 Long term (current) use of aspirin: Secondary | ICD-10-CM | POA: Diagnosis not present

## 2023-01-07 DIAGNOSIS — W230XXA Caught, crushed, jammed, or pinched between moving objects, initial encounter: Secondary | ICD-10-CM | POA: Insufficient documentation

## 2023-01-07 DIAGNOSIS — Z79899 Other long term (current) drug therapy: Secondary | ICD-10-CM | POA: Diagnosis not present

## 2023-01-07 DIAGNOSIS — Z7984 Long term (current) use of oral hypoglycemic drugs: Secondary | ICD-10-CM | POA: Insufficient documentation

## 2023-01-07 DIAGNOSIS — S6992XA Unspecified injury of left wrist, hand and finger(s), initial encounter: Secondary | ICD-10-CM | POA: Diagnosis present

## 2023-01-07 DIAGNOSIS — E119 Type 2 diabetes mellitus without complications: Secondary | ICD-10-CM | POA: Insufficient documentation

## 2023-01-07 MED ORDER — HYDROCODONE-ACETAMINOPHEN 5-325 MG PO TABS
1.0000 | ORAL_TABLET | Freq: Once | ORAL | Status: AC
  Administered 2023-01-07: 1 via ORAL
  Filled 2023-01-07: qty 1

## 2023-01-07 MED ORDER — CEPHALEXIN 500 MG PO CAPS: 500.0000 mg | ORAL_CAPSULE | Freq: Four times a day (QID) | ORAL | 0 refills | Status: AC

## 2023-01-07 NOTE — Discharge Instructions (Addendum)
We placed your left middle finger on a splint, you will need to keep this in place until you follow-up with the hand specialist.  Placed on antibiotics today in order to help treat a likely infection.  Please take 1 tablet 4 times a day for the next 7 days.  You will need to follow-up with Dr. Dallie Piles hand specialist in order to obtain further follow-up for your hand pain.

## 2023-01-07 NOTE — ED Triage Notes (Signed)
Pt arrived POV from home stating he smashed his middle finger sometime last week not sure when changing the brakes and was sent for xray.

## 2023-01-07 NOTE — ED Provider Notes (Signed)
[facility] Provider Note   CSN: [id number] Arrival date & time: 01/07/23  1251     History  Chief Complaint  Patient presents with   Finger Injury    Darryl Hurley is a 62 y.o. male.  62 year old male with a past medical history of diabetes presents to the ED status post left middle finger injury which occurred approximately 1 week ago.  He reports he accidentally smashed his finger while working on a car.  Has been cleaning this with peroxide, evaluated by PCP today and noted a malodorous smell coming from the wound.  Is not been running any fevers, no chills.  He was sent by PCP in order to obtain an x-ray.  He has not been taking any medication for pain control.  No systemic signs such as fever, or chills.  Blood sugars have been running at his norm at home.  The history is provided by the patient and medical records.       Home Medications Prior to Admission medications   Medication Sig Start Date End Date Taking? Authorizing Provider  cephALEXin (KEFLEX) 500 MG capsule Take 1 capsule (500 mg total) by mouth 4 (four) times daily for 7 days. 01/07/23 01/14/23 Yes Mackenzie Groom, Beverley Fiedler, PA-C  acetaminophen (TYLENOL) 325 MG tablet Take 2 tablets (650 mg total) by mouth every 6 (six) hours as needed. Patient taking differently: Take 325 mg by mouth every 6 (six) hours as needed for moderate pain. 05/20/19   Lamptey, Myrene Galas, MD  albuterol (PROVENTIL HFA;VENTOLIN HFA) 108 (90 BASE) MCG/ACT inhaler Inhale 1 puff into the lungs every 6 (six) hours as needed for wheezing or shortness of breath.    [provider]  allopurinol (ZYLOPRIM) 300 MG tablet Take 300 mg by mouth daily. 02/02/21   [provider]  amLODipine (NORVASC) 10 MG tablet Take 10 mg by mouth daily. 08/06/18   [provider]  ASPIRIN LOW DOSE 81 MG tablet Take 1 tablet (81 mg total) by mouth daily. Swallow whole. 06/08/22   Patwardhan, Reynold Bowen, MD  cloNIDine  (CATAPRES) 0.1 MG tablet Take 0.1 mg by mouth 2 (two) times daily. 08/15/18   [provider]  colchicine 0.6 MG tablet Take 0.6 mg by mouth daily as needed (gout flare). 08/15/20   [provider]  diclofenac Sodium (VOLTAREN) 1 % GEL Apply 2 g topically 4 (four) times daily as needed (For Knee Pain). 02/07/22   Atway, Rayann N, DO  DULoxetine (CYMBALTA) 30 MG capsule Take 1 capsule (30 mg total) by mouth daily. 02/08/22 03/10/22  Atway, Rayann N, DO  fluorometholone (FML) 0.1 % ophthalmic suspension Place 1 drop into both eyes daily. 12/01/21   [provider]  fluticasone (FLONASE) 50 MCG/ACT nasal spray Place 1 spray into both nostrils daily. 02/21/21   [provider]  fluticasone furoate-vilanterol (BREO ELLIPTA) 100-25 MCG/ACT AEPB Inhale into the lungs. 06/14/22   [provider]  gabapentin (NEURONTIN) 600 MG tablet Take 600 mg by mouth 3 (three) times daily. 09/12/20   [provider]  HYDROcodone-acetaminophen (NORCO/VICODIN) 5-325 MG tablet Take by mouth.    [provider]  lidocaine (LIDODERM) 5 % Place 1 patch onto the skin daily. 02/21/21   [provider]  losartan (COZAAR) 100 MG tablet Take 100 mg by mouth daily. 08/13/20   [provider]  metFORMIN (GLUCOPHAGE) 500 MG tablet Take by mouth. 07/02/22   [provider]  metoprolol succinate (TOPROL-XL)  50 MG 24 hr tablet Take 1 tablet (50 mg total) by mouth daily. Take with or immediately following a meal. Patient taking differently: Take 50 mg by mouth daily. 04/06/21 04/07/22  Patwardhan, Reynold Bowen, MD  metoprolol tartrate (LOPRESSOR) 50 MG tablet Take 50 mg by mouth 2 (two) times daily.    [provider]  nitroGLYCERIN (NITROSTAT) 0.4 MG SL tablet Place 1 tablet (0.4 mg total) under the tongue every 5 (five) minutes x 3 doses as needed for chest pain. 02/11/21   Joy, Shawn C, PA-C  oxyCODONE (OXY IR/ROXICODONE) 5 MG immediate release tablet Take 5 mg  by mouth every 6 (six) hours as needed for severe pain.    [provider]  pantoprazole (PROTONIX) 40 MG tablet Take 40 mg by mouth daily.    [provider]  RESTASIS 0.05 % ophthalmic emulsion Place 1 drop into both eyes daily. 12/01/21   [provider]  rosuvastatin (CRESTOR) 40 MG tablet Take 40 mg by mouth daily.    [provider]  SYMBICORT 160-4.5 MCG/ACT inhaler Inhale 2 puffs into the lungs 2 (two) times daily. 11/05/19   [provider]  labetalol (NORMODYNE) 200 MG tablet Take 1 tablet (200 mg total) by mouth 2 (two) times daily. Patient not taking: Reported on 03/25/2019 02/16/19 05/20/19  Nigel Mormon, MD      Allergies    Nsaids and Toradol [ketorolac tromethamine]    Review of Systems   Review of Systems  Constitutional:  Negative for fever.  Gastrointestinal:  Negative for abdominal pain.  Musculoskeletal:  Negative for back pain.  Skin:  Positive for wound.    Physical Exam Updated Vital Signs BP (!) 163/95   Pulse (!) 114   Temp 98.7 F (37.1 C) (Oral)   Resp (!) 22   Ht 5\' 5"  (1.651 m)   Wt 72.6 kg   SpO2 98%   BMI 26.63 kg/m  Physical Exam Vitals and nursing note reviewed.  Constitutional:      Appearance: Normal appearance.  HENT:     Head: Normocephalic and atraumatic.  Eyes:     Pupils: Pupils are equal, round, and reactive to light.  Cardiovascular:     Rate and Rhythm: Normal rate.  Pulmonary:     Effort: Pulmonary effort is normal.  Abdominal:     General: Abdomen is flat.  Musculoskeletal:     Left hand: Swelling and tenderness present.     Cervical back: Normal range of motion and neck supple.     Comments: Swelling to the left middle DIP noted with slight laceration which appears to have scabbed over.  Skin:    General: Skin is warm and dry.     Findings: Erythema present.  Neurological:     Mental Status: He is alert.     ED Results / Procedures / Treatments   Labs (all labs  ordered are listed, but only abnormal results are displayed) Labs Reviewed - No data to display  EKG None  Radiology DG Hand Complete Left  Result Date: 01/07/2023 CLINICAL DATA:  Left third digit finger tip injury. EXAM: LEFT HAND - COMPLETE 3+ VIEW COMPARISON:  02/05/2022. FINDINGS: Mildly comminuted third distal phalangeal tuft fracture. 3 mm volar displacement of the distal fracture fragment. 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 IMPRESSION: 1. Mildly displaced third distal phalangeal tuft fracture. 2. 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 Electronically Signed   By: Lorin Picket M.D.   On: 01/07/2023 13:46    Procedures Procedures    Medications Ordered in ED Medications  HYDROcodone-acetaminophen (NORCO/VICODIN) 5-325 MG per tablet 1 tablet (1 tablet Oral Given 01/07/23 1429)    ED Course/ Medical Decision Making/ A&P Clinical Course as of 01/07/23 1507  Mon Jan 07, 2023  1414 DG Hand Complete Left [JS]    Clinical Course User Index [JS] Janeece Fitting, PA-C                             Medical Decision Making Amount and/or Complexity of Data Reviewed Radiology:  Decision-making details documented in ED Course.  Risk Prescription drug management.     Presents to the ED status post left finger injury approximately 1 week ago.  Sent in by PCP due to malodorous wound.  Patient is a diabetic at baseline, blood sugars have been running within normal at home.  He has been cleaning the wound with peroxide.  Arrived to the ED tachycardic with a heart rate in the 114, denies any fever or chills.  On evaluation there is swelling significant to the DIP of the left middle finger.  Pulses are present, sensation is intact however there is swelling along with decreased range of motion  due to pain.  X-ray 1. Mildly displaced third distal phalangeal tuft fracture.  2. Advanced osteoarthritic changes in the first metacarpophalangeal  joint, second PIP and DIP joints and fourth PIP joint.     These results were discussed with patient, he was given Norco for pain control. Rechecked vitals HR 90. We discussed antibiotics and her to help prevent any further infection.  Patient is hemodynamically stable for discharge.  Portions of this note were generated with [employer]. Dictation errors may occur despite best attempts at proofreading.   Final Clinical Impression(s) / ED Diagnoses Final diagnoses:  Injury of finger of left hand, initial encounter    Rx / DC Orders ED Discharge Orders          Ordered    cephALEXin (KEFLEX) 500 MG capsule  4 times daily        01/07/23 1507              Janeece Fitting, PA-C 01/07/23 1507    Malvin Johns, MD 01/07/23 1559

## 2023-01-07 NOTE — ED Notes (Signed)
Pt verbalizes understanding of discharge instructions. Opportunity for questions and answers were provided. Pt discharged from the ED.   ?

## 2023-01-07 NOTE — ED Provider Triage Note (Signed)
Emergency Medicine Provider Triage Evaluation Note  Darryl Hurley , a 63 y.o. male  was evaluated in triage.  Pt complains of left middle finger pain after smashing it a week ago.  States that he accidentally smashed it when working on his car but did not fall, hit his head, or sustain any other injury.  States that there is no significant bleeding from the wound at that time but he has had pain and swelling since and went to his PCP for evaluation this morning and was told to come to the ED for an x-ray.  Review of Systems  Positive: See HPI Negative: See HPI  Physical Exam  BP (!) 163/95   Pulse (!) 114   Temp 98.7 F (37.1 C) (Oral)   Resp (!) 22   Ht 5\' 5"  (1.651 m)   Wt 72.6 kg   SpO2 98%   BMI 26.63 kg/m  Gen:   Awake, no distress   Resp:  Normal effort  MSK:   Diffuse swelling of the left middle finger worse distally with small superficial abrasion lateral to intact nail, diffuse tenderness from the PIP joint distally down the finger, patient able to flex and extend at the PIP joint but unable to do so at DIP joint Other:  Medical Decision Making  Medically screening exam initiated at 1:24 PM.  Appropriate orders placed.  Darryl Hurley was informed that the remainder of the evaluation will be completed by another provider, this initial triage assessment does not replace that evaluation, and the importance of remaining in the ED until their evaluation is complete.     Suzzette Righter, PA-C 01/07/23 1326

## 2023-01-18 ENCOUNTER — Other Ambulatory Visit: Payer: Self-pay

## 2023-01-18 ENCOUNTER — Emergency Department (HOSPITAL_COMMUNITY)
Admission: EM | Admit: 2023-01-18 | Discharge: 2023-01-18 | Disposition: A | Discharge status: Home / Self Care | Payer: Medicaid Other | Attending: Emergency Medicine | Admitting: Emergency Medicine

## 2023-01-18 DIAGNOSIS — F10920 Alcohol use, unspecified with intoxication, uncomplicated: Secondary | ICD-10-CM

## 2023-01-18 DIAGNOSIS — S62633S Displaced fracture of distal phalanx of left middle finger, sequela: Secondary | ICD-10-CM

## 2023-01-18 DIAGNOSIS — X58XXXS Exposure to other specified factors, sequela: Secondary | ICD-10-CM | POA: Diagnosis not present

## 2023-01-18 DIAGNOSIS — F10120 Alcohol abuse with intoxication, uncomplicated: Secondary | ICD-10-CM | POA: Insufficient documentation

## 2023-01-18 DIAGNOSIS — Z7982 Long term (current) use of aspirin: Secondary | ICD-10-CM | POA: Insufficient documentation

## 2023-01-18 MED ORDER — LIDOCAINE 5 % EX PTCH: 1.0000 | MEDICATED_PATCH | CUTANEOUS | 0 refills | Status: DC

## 2023-01-18 NOTE — ED Notes (Signed)
Pt was walking around. Redirect pt back to strecher to await safe ride home. Pt complaint at this time. Charge Quarry manager informed.

## 2023-01-18 NOTE — ED Notes (Signed)
Pt departed ED without allowing staff to take vs. Pt exited via EMS bay.

## 2023-01-18 NOTE — ED Triage Notes (Addendum)
Pt was BIB [facility] d/t alcohol intoxication. Wife called out d/t concern for pt having a broom stick. Pt has been agitated with EMS, redirectable, No violent behavior in route.

## 2023-01-18 NOTE — ED Notes (Signed)
Pt was placed for discharge by EDP. Was awaiting ride home. Pt called daughter and son for ride on personal phone. Pt stated he no longer wants to wait, will be taking the bus, and walked out the EMS bay.

## 2023-01-18 NOTE — ED Notes (Signed)
Pt ambulated to the bathroom, now laying down sleeping

## 2023-01-18 NOTE — ED Notes (Signed)
Pt is aware he is cleared for discharge. Pt to call family for safe ride home. Pt has personal phone at bedside

## 2023-01-18 NOTE — ED Provider Notes (Signed)
[place] EMERGENCY DEPARTMENT AT [facility] Provider Note   CSN: [id number] Arrival date & time: 01/18/23  0206     History  Chief Complaint  Patient presents with   Alcohol Intoxication    Darryl Hurley is a 62 y.o. male.  The history is provided by the patient.  Alcohol Intoxication This is a new problem. The current episode started 6 to 12 hours ago (drank half a pint of liquor to get drunk, not SI or HI.  No trauma no falls). The problem occurs constantly. The problem has been gradually improving. Pertinent negatives include no chest pain, no abdominal pain, no headaches and no shortness of breath. Nothing aggravates the symptoms. Nothing relieves the symptoms. He has tried nothing for the symptoms. The treatment provided moderate relief.       Home Medications Prior to Admission medications   Medication Sig Start Date End Date Taking? Authorizing Provider  lidocaine (LIDODERM) 5 % Place 1 patch onto the skin daily. Remove & Discard patch within 12 hours or as directed by MD 01/18/23  Yes Tawanna Funk, MD  acetaminophen (TYLENOL) 325 MG tablet Take 2 tablets (650 mg total) by mouth every 6 (six) hours as needed. Patient taking differently: Take 325 mg by mouth every 6 (six) hours as needed for moderate pain. 05/20/19   Lamptey, Myrene Galas, MD  albuterol (PROVENTIL HFA;VENTOLIN HFA) 108 (90 BASE) MCG/ACT inhaler Inhale 1 puff into the lungs every 6 (six) hours as needed for wheezing or shortness of breath.    [provider]  allopurinol (ZYLOPRIM) 300 MG tablet Take 300 mg by mouth daily. 02/02/21   [provider]  amLODipine (NORVASC) 10 MG tablet Take 10 mg by mouth daily. 08/06/18   [provider]  ASPIRIN LOW DOSE 81 MG tablet Take 1 tablet (81 mg total) by mouth daily. Swallow whole. 06/08/22   Patwardhan, Reynold Bowen, MD  cloNIDine (CATAPRES) 0.1 MG tablet Take 0.1 mg by mouth 2 (two) times daily. 08/15/18   [provider]   colchicine 0.6 MG tablet Take 0.6 mg by mouth daily as needed (gout flare). 08/15/20   [provider]  diclofenac Sodium (VOLTAREN) 1 % GEL Apply 2 g topically 4 (four) times daily as needed (For Knee Pain). 02/07/22   Atway, Rayann N, DO  DULoxetine (CYMBALTA) 30 MG capsule Take 1 capsule (30 mg total) by mouth daily. 02/08/22 03/10/22  Atway, Rayann N, DO  fluorometholone (FML) 0.1 % ophthalmic suspension Place 1 drop into both eyes daily. 12/01/21   [provider]  fluticasone (FLONASE) 50 MCG/ACT nasal spray Place 1 spray into both nostrils daily. 02/21/21   [provider]  fluticasone furoate-vilanterol (BREO ELLIPTA) 100-25 MCG/ACT AEPB Inhale into the lungs. 06/14/22   [provider]  gabapentin (NEURONTIN) 600 MG tablet Take 600 mg by mouth 3 (three) times daily. 09/12/20   [provider]  HYDROcodone-acetaminophen (NORCO/VICODIN) 5-325 MG tablet Take by mouth.    [provider]  lidocaine (LIDODERM) 5 % Place 1 patch onto the skin daily. 02/21/21   [provider]  losartan (COZAAR) 100 MG tablet Take 100 mg by mouth daily. 08/13/20   [provider]  metFORMIN (GLUCOPHAGE) 500 MG tablet Take by mouth. 07/02/22   [provider]  metoprolol succinate (TOPROL-XL) 50 MG 24 hr tablet Take 1 tablet (50 mg total) by mouth daily. Take with or immediately following a meal. Patient taking differently: Take 50 mg by mouth daily.  04/06/21 04/07/22  Patwardhan, Reynold Bowen, MD  metoprolol tartrate (LOPRESSOR) 50 MG tablet Take 50 mg by mouth 2 (two) times daily.    [provider]  nitroGLYCERIN (NITROSTAT) 0.4 MG SL tablet Place 1 tablet (0.4 mg total) under the tongue every 5 (five) minutes x 3 doses as needed for chest pain. 02/11/21   Joy, Shawn C, PA-C  oxyCODONE (OXY IR/ROXICODONE) 5 MG immediate release tablet Take 5 mg by mouth every 6 (six) hours as needed for severe pain.    [provider]  pantoprazole  (PROTONIX) 40 MG tablet Take 40 mg by mouth daily.    [provider]  RESTASIS 0.05 % ophthalmic emulsion Place 1 drop into both eyes daily. 12/01/21   [provider]  rosuvastatin (CRESTOR) 40 MG tablet Take 40 mg by mouth daily.    [provider]  SYMBICORT 160-4.5 MCG/ACT inhaler Inhale 2 puffs into the lungs 2 (two) times daily. 11/05/19   [provider]  labetalol (NORMODYNE) 200 MG tablet Take 1 tablet (200 mg total) by mouth 2 (two) times daily. Patient not taking: Reported on 03/25/2019 02/16/19 05/20/19  Nigel Mormon, MD      Allergies    Nsaids and Toradol [ketorolac tromethamine]    Review of Systems   Review of Systems  Constitutional:  Negative for fever.  HENT:  Negative for facial swelling.   Eyes:  Negative for redness.  Respiratory:  Negative for shortness of breath.   Cardiovascular:  Negative for chest pain.  Gastrointestinal:  Negative for abdominal pain.  Neurological:  Negative for headaches.  Psychiatric/Behavioral:  Negative for dysphoric mood, hallucinations, self-injury and suicidal ideas. The patient is not nervous/anxious and is not hyperactive.   All other systems reviewed and are negative.   Physical Exam Updated Vital Signs BP (!) 154/88 (BP Location: Left Arm)   Pulse 94   Temp 98 F (36.7 C) (Oral)   Resp (!) 21   SpO2 98%  Physical Exam Vitals and nursing note reviewed. Exam conducted with a chaperone present.  Constitutional:      General: He is not in acute distress.    Appearance: He is well-developed. He is not diaphoretic.  HENT:     Head: Normocephalic and atraumatic.  Eyes:     Conjunctiva/sclera: Conjunctivae normal.     Pupils: Pupils are equal, round, and reactive to light.  Cardiovascular:     Rate and Rhythm: Normal rate and regular rhythm.  Pulmonary:     Effort: Pulmonary effort is normal.     Breath sounds: Normal breath sounds. No wheezing or rales.  Abdominal:     General:  Bowel sounds are normal.     Palpations: Abdomen is soft.     Tenderness: There is no abdominal tenderness. There is no guarding or rebound.  Musculoskeletal:        General: Normal range of motion.     Cervical back: Normal range of motion and neck supple.     Comments: Middle finger taped no palpable deformity   Skin:    General: Skin is warm and dry.     Capillary Refill: Capillary refill takes less than 2 seconds.  Neurological:     Mental Status: He is alert and oriented to person, place, and time.     ED Results / Procedures / Treatments   Labs (all labs ordered are listed, but only abnormal results are displayed) Labs Reviewed - No data to display  EKG None  Radiology No results found.  Procedures Procedures    Medications Ordered in ED Medications - No data to display  ED Course/ Medical Decision Making/ A&P                             Medical Decision Making Patient wh drank to get drunk but was sent to the ED because he was carrying a broom stick but did not threaten nor injure anyone   Amount and/or Complexity of Data Reviewed Independent Historian: EMS    Details: See above  External Data Reviewed: notes.    Details: Previous notes reviewed   Risk Prescription drug management.    Final Clinical Impression(s) / ED Diagnoses Final diagnoses:  Alcoholic intoxication without complication ([place])  Closed displaced fracture of distal phalanx of left middle finger, sequela   Return for intractable cough, coughing up blood, fevers > 100.4 unrelieved by medication, shortness of breath, intractable vomiting, chest pain, shortness of breath, weakness, numbness, changes in speech, facial asymmetry, abdominal pain, passing out, Inability to tolerate liquids or food, cough, altered mental status or any concerns. No signs of systemic illness or infection. The patient is nontoxic-appearing on exam and vital signs are within normal limits.  I have reviewed the triage  vital signs and the nursing notes. Pertinent labs & imaging results that were available during my care of the patient were reviewed by me and considered in my medical decision making (see chart for details). After history, exam, and medical workup I feel the patient has been appropriately medically screened and is safe for discharge home. Pertinent diagnoses were discussed with the patient. Patient was given return precautions.  Rx / DC Orders ED Discharge Orders          Ordered    lidocaine (LIDODERM) 5 %  Every 24 hours        01/18/23 0251              Maison Agrusa, MD 01/18/23 [id number]

## 2023-02-27 ENCOUNTER — Emergency Department (HOSPITAL_COMMUNITY)
Admission: EM | Admit: 2023-02-27 | Discharge: 2023-02-27 | Disposition: A | Discharge status: Home / Self Care | Payer: Medicaid Other | Attending: Emergency Medicine | Admitting: Emergency Medicine

## 2023-02-27 ENCOUNTER — Other Ambulatory Visit: Payer: Self-pay

## 2023-02-27 ENCOUNTER — Encounter (HOSPITAL_COMMUNITY): Payer: Self-pay | Admitting: *Deleted

## 2023-02-27 ENCOUNTER — Emergency Department (HOSPITAL_BASED_OUTPATIENT_CLINIC_OR_DEPARTMENT_OTHER): Payer: Medicaid Other

## 2023-02-27 ENCOUNTER — Telehealth: Payer: Self-pay | Admitting: Cardiology

## 2023-02-27 ENCOUNTER — Emergency Department (HOSPITAL_COMMUNITY): Payer: Medicaid Other

## 2023-02-27 DIAGNOSIS — I10 Essential (primary) hypertension: Secondary | ICD-10-CM | POA: Diagnosis not present

## 2023-02-27 DIAGNOSIS — E119 Type 2 diabetes mellitus without complications: Secondary | ICD-10-CM | POA: Diagnosis not present

## 2023-02-27 DIAGNOSIS — Z79899 Other long term (current) drug therapy: Secondary | ICD-10-CM | POA: Insufficient documentation

## 2023-02-27 DIAGNOSIS — Z7951 Long term (current) use of inhaled steroids: Secondary | ICD-10-CM | POA: Diagnosis not present

## 2023-02-27 DIAGNOSIS — E1165 Type 2 diabetes mellitus with hyperglycemia: Secondary | ICD-10-CM | POA: Diagnosis present

## 2023-02-27 DIAGNOSIS — R079 Chest pain, unspecified: Secondary | ICD-10-CM

## 2023-02-27 DIAGNOSIS — Z7984 Long term (current) use of oral hypoglycemic drugs: Secondary | ICD-10-CM | POA: Diagnosis not present

## 2023-02-27 DIAGNOSIS — R0789 Other chest pain: Secondary | ICD-10-CM | POA: Insufficient documentation

## 2023-02-27 DIAGNOSIS — Z7982 Long term (current) use of aspirin: Secondary | ICD-10-CM | POA: Insufficient documentation

## 2023-02-27 DIAGNOSIS — F17210 Nicotine dependence, cigarettes, uncomplicated: Secondary | ICD-10-CM

## 2023-02-27 DIAGNOSIS — Z91199 Patient's noncompliance with other medical treatment and regimen due to unspecified reason: Secondary | ICD-10-CM

## 2023-02-27 DIAGNOSIS — J449 Chronic obstructive pulmonary disease, unspecified: Secondary | ICD-10-CM | POA: Insufficient documentation

## 2023-02-27 DIAGNOSIS — I169 Hypertensive crisis, unspecified: Secondary | ICD-10-CM

## 2023-02-27 LAB — ECHOCARDIOGRAM COMPLETE
AR max vel: 2.59 cm2
AV Area VTI: 2.66 cm2
AV Area mean vel: 2.59 cm2
AV Mean grad: 4 mmHg
AV Peak grad: 8.1 mmHg
Ao pk vel: 1.42 m/s
Area-P 1/2: 2.91 cm2
Calc EF: 58.4 %
Height: 65 in
S' Lateral: 2.9 cm
Single Plane A2C EF: 61 %
Single Plane A4C EF: 56.8 %
Weight: 2560 oz

## 2023-02-27 LAB — CBC WITH DIFFERENTIAL/PLATELET
Abs Immature Granulocytes: 0.03 10*3/uL (ref 0.00–0.07)
Basophils Absolute: 0 10*3/uL (ref 0.0–0.1)
Basophils Relative: 1 %
Eosinophils Absolute: 0.1 10*3/uL (ref 0.0–0.5)
Eosinophils Relative: 2 %
HCT: 42.1 % (ref 39.0–52.0)
Hemoglobin: 13.5 g/dL (ref 13.0–17.0)
Immature Granulocytes: 0 %
Lymphocytes Relative: 17 %
Lymphs Abs: 1.1 10*3/uL (ref 0.7–4.0)
MCH: 30.2 pg (ref 26.0–34.0)
MCHC: 32.1 g/dL (ref 30.0–36.0)
MCV: 94.2 fL (ref 80.0–100.0)
Monocytes Absolute: 0.8 10*3/uL (ref 0.1–1.0)
Monocytes Relative: 11 %
Neutro Abs: 4.7 10*3/uL (ref 1.7–7.7)
Neutrophils Relative %: 69 %
Platelets: 224 10*3/uL (ref 150–400)
RBC: 4.47 MIL/uL (ref 4.22–5.81)
RDW: 15.8 % — ABNORMAL HIGH (ref 11.5–15.5)
WBC: 6.8 10*3/uL (ref 4.0–10.5)
nRBC: 0 % (ref 0.0–0.2)

## 2023-02-27 LAB — COMPREHENSIVE METABOLIC PANEL
ALT: 30 U/L (ref 0–44)
AST: 51 U/L — ABNORMAL HIGH (ref 15–41)
Albumin: 3.6 g/dL (ref 3.5–5.0)
Alkaline Phosphatase: 70 U/L (ref 38–126)
Anion gap: 8 (ref 5–15)
BUN: 19 mg/dL (ref 6–20)
CO2: 27 mmol/L (ref 22–32)
Calcium: 9.2 mg/dL (ref 8.9–10.3)
Chloride: 105 mmol/L (ref 98–111)
Creatinine, Ser: 1.11 mg/dL (ref 0.61–1.24)
GFR, Estimated: >60 mL/min (ref >60)
Glucose, Bld: 155 mg/dL — ABNORMAL HIGH (ref 70–99)
Potassium: 3.6 mmol/L (ref 3.5–5.1)
Sodium: 140 mmol/L (ref 135–145)
Total Bilirubin: 0.7 mg/dL (ref 0.3–1.2)
Total Protein: 7 g/dL (ref 6.5–8.1)

## 2023-02-27 LAB — RAPID URINE DRUG SCREEN, HOSP PERFORMED
Amphetamines: NOT DETECTED
Barbiturates: NOT DETECTED
Benzodiazepines: NOT DETECTED
Cocaine: NOT DETECTED
Opiates: NOT DETECTED
Tetrahydrocannabinol: NOT DETECTED

## 2023-02-27 LAB — TROPONIN I (HIGH SENSITIVITY)
Troponin I (High Sensitivity): 11 ng/L (ref <18)
Troponin I (High Sensitivity): 12 ng/L (ref <18)

## 2023-02-27 LAB — BRAIN NATRIURETIC PEPTIDE: B Natriuretic Peptide: 136.5 pg/mL — ABNORMAL HIGH (ref 0.0–100.0)

## 2023-02-27 LAB — LIPASE, BLOOD: Lipase: 76 U/L — ABNORMAL HIGH (ref 11–51)

## 2023-02-27 MED ORDER — ISOSORB DINITRATE-HYDRALAZINE 20-37.5 MG PO TABS: 1.0000 | ORAL_TABLET | Freq: Three times a day (TID) | ORAL | 0 refills | Status: AC

## 2023-02-27 MED ORDER — ISOSORB DINITRATE-HYDRALAZINE 20-37.5 MG PO TABS
1.0000 | ORAL_TABLET | Freq: Three times a day (TID) | ORAL | Status: DC
  Filled 2023-02-27: qty 1

## 2023-02-27 MED ORDER — NITROGLYCERIN 0.4 MG SL SUBL
0.4000 mg | SUBLINGUAL_TABLET | Freq: Once | SUBLINGUAL | Status: AC
  Administered 2023-02-27: 0.4 mg via SUBLINGUAL
  Filled 2023-02-27: qty 1

## 2023-02-27 MED ORDER — ALUM & MAG HYDROXIDE-SIMETH 200-200-20 MG/5ML PO SUSP
30.0000 mL | Freq: Once | ORAL | Status: AC
  Administered 2023-02-27: 30 mL via ORAL
  Filled 2023-02-27: qty 30

## 2023-02-27 NOTE — ED Notes (Signed)
Patient was going to leave AMA.  Explained risk of leaving AMA such as worsening condition, heart attack, and death.  Patient verbalized understanding.  As I was letting him leave ECHO showed up to so ECHO is being performed at this time.

## 2023-02-27 NOTE — Discharge Instructions (Signed)
Discontinue your Lopressor and metoprolol.  Start taking BiDil which had been prescribed to you

## 2023-02-27 NOTE — ED Notes (Signed)
Ambulatory to bathroom with steady gait

## 2023-02-27 NOTE — ED Provider Notes (Addendum)
[place] AT [facility] Provider Note   CSN: [id number] Arrival date & time: 02/27/23  [id number]     History  Chief Complaint  Darryl Hurley presents with   Chest Pain    Darryl Hurley is a 62 y.o. male.  Darryl Hurley is here with chest pain.  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  Darryl Hurley states that pain started after eating some greasy food last night.  Pain is right lower chest, right upper quadrant/epigastric.  Denies any nausea or vomiting but feels a little bit of sharpness and burning even in his throat at times.  Darryl Hurley denies any weakness or numbness or chills.  Denies any exertional chest pain or shortness of breath.  In the last few weeks.  Darryl Hurley does note some alcohol use as well.  Denies any recent surgery or travel.  Denies any leg swelling.  The history is provided by the Darryl Hurley.       Home Medications Prior to Admission medications   Medication Sig Start Date End Date Taking? Authorizing Provider  acetaminophen (TYLENOL) 325 MG tablet Take 2 tablets (650 mg total) by mouth every 6 (six) hours as needed. Darryl Hurley taking differently: Take 325 mg by mouth every 6 (six) hours as needed for moderate pain. 05/20/19   Lamptey, Myrene Galas, MD  albuterol (PROVENTIL HFA;VENTOLIN HFA) 108 (90 BASE) MCG/ACT inhaler Inhale 1 puff into the lungs every 6 (six) hours as needed for wheezing or shortness of breath.    [provider]  allopurinol (ZYLOPRIM) 300 MG tablet Take 300 mg by mouth daily. 02/02/21   [provider]  amLODipine (NORVASC) 10 MG tablet Take 10 mg by mouth daily. 08/06/18   [provider]  ASPIRIN LOW DOSE 81 MG tablet Take 1 tablet (81 mg total) by mouth daily. Swallow whole. 06/08/22   Patwardhan, Reynold Bowen, MD  cloNIDine (CATAPRES) 0.1 MG tablet Take 0.1 mg by mouth 2 (two) times daily. 08/15/18   [provider]  colchicine 0.6 MG tablet Take 0.6 mg by  mouth daily as needed (gout flare). 08/15/20   [provider]  diclofenac Sodium (VOLTAREN) 1 % GEL Apply 2 g topically 4 (four) times daily as needed (For Knee Pain). 02/07/22   Atway, Rayann N, DO  DULoxetine (CYMBALTA) 30 MG capsule Take 1 capsule (30 mg total) by mouth daily. 02/08/22 03/10/22  Atway, Rayann N, DO  fluorometholone (FML) 0.1 % ophthalmic suspension Place 1 drop into both eyes daily. 12/01/21   [provider]  fluticasone (FLONASE) 50 MCG/ACT nasal spray Place 1 spray into both nostrils daily. 02/21/21   [provider]  fluticasone furoate-vilanterol (BREO ELLIPTA) 100-25 MCG/ACT AEPB Inhale into the lungs. 06/14/22   [provider]  gabapentin (NEURONTIN) 600 MG tablet Take 600 mg by mouth 3 (three) times daily. 09/12/20   [provider]  HYDROcodone-acetaminophen (NORCO/VICODIN) 5-325 MG tablet Take by mouth.    [provider]  lidocaine (LIDODERM) 5 % Place 1 patch onto the skin daily. 02/21/21   [provider]  lidocaine (LIDODERM) 5 % Place 1 patch onto the skin daily. Remove & Discard patch within 12 hours or as directed by MD 01/18/23   Randal Buba, April, MD  losartan (COZAAR) 100 MG tablet Take 100 mg by mouth daily. 08/13/20   [provider]  metFORMIN (GLUCOPHAGE) 500 MG tablet Take by mouth. 07/02/22   [provider]  metoprolol succinate (TOPROL-XL)  50 MG 24 hr tablet Take 1 tablet (50 mg total) by mouth daily. Take with or immediately following a meal. Darryl Hurley taking differently: Take 50 mg by mouth daily. 04/06/21 04/07/22  Patwardhan, Reynold Bowen, MD  metoprolol tartrate (LOPRESSOR) 50 MG tablet Take 50 mg by mouth 2 (two) times daily.    [provider]  nitroGLYCERIN (NITROSTAT) 0.4 MG SL tablet Place 1 tablet (0.4 mg total) under the tongue every 5 (five) minutes x 3 doses as needed for chest pain. 02/11/21   Joy, Shawn C, PA-C  oxyCODONE (OXY IR/ROXICODONE) 5 MG immediate release tablet  Take 5 mg by mouth every 6 (six) hours as needed for severe pain.    [provider]  pantoprazole (PROTONIX) 40 MG tablet Take 40 mg by mouth daily.    [provider]  RESTASIS 0.05 % ophthalmic emulsion Place 1 drop into both eyes daily. 12/01/21   [provider]  rosuvastatin (CRESTOR) 40 MG tablet Take 40 mg by mouth daily.    [provider]  SYMBICORT 160-4.5 MCG/ACT inhaler Inhale 2 puffs into the lungs 2 (two) times daily. 11/05/19   [provider]  labetalol (NORMODYNE) 200 MG tablet Take 1 tablet (200 mg total) by mouth 2 (two) times daily. Darryl Hurley not taking: Reported on 03/25/2019 02/16/19 05/20/19  Nigel Mormon, MD      Allergies    Nsaids and Toradol [ketorolac tromethamine]    Review of Systems   Review of Systems  Physical Exam Updated Vital Signs BP (!) 167/115   Pulse 74   Temp 98.4 F (36.9 C) (Oral)   Resp 19   Ht 5\' 5"  (1.651 m)   Wt 72.6 kg   SpO2 93%   BMI 26.63 kg/m  Physical Exam Vitals and nursing note reviewed.  Constitutional:      General: Darryl Hurley is not in acute distress.    Appearance: Darryl Hurley is well-developed. Darryl Hurley is not ill-appearing.  HENT:     Head: Normocephalic and atraumatic.  Eyes:     Extraocular Movements: Extraocular movements intact.     Conjunctiva/sclera: Conjunctivae normal.     Pupils: Pupils are equal, round, and reactive to light.  Cardiovascular:     Rate and Rhythm: Normal rate and regular rhythm.     Pulses:          Radial pulses are 2+ on the right side and 2+ on the left side.     Heart sounds: Normal heart sounds. No murmur heard. Pulmonary:     Effort: Pulmonary effort is normal. No respiratory distress.     Breath sounds: Normal breath sounds. No decreased breath sounds or wheezing.  Chest:     Chest wall: Tenderness present.  Abdominal:     Palpations: Abdomen is soft.     Tenderness: There is no abdominal tenderness.  Musculoskeletal:        General: No swelling.  Normal range of motion.     Cervical back: Normal range of motion and neck supple.     Right lower leg: No edema.     Left lower leg: No edema.  Skin:    General: Skin is warm and dry.     Capillary Refill: Capillary refill takes less than 2 seconds.  Neurological:     General: No focal deficit present.     Mental Status: Darryl Hurley is alert.  Psychiatric:        Mood and Affect: Mood normal.     ED Results /  Procedures / Treatments   Labs (all labs ordered are listed, but only abnormal results are displayed) Labs Reviewed  CBC WITH DIFFERENTIAL/PLATELET - Abnormal; Notable for the following components:      Result Value   RDW 15.8 (*)    All other components within normal limits  COMPREHENSIVE METABOLIC PANEL - Abnormal; Notable for the following components:   Glucose, Bld 155 (*)    AST 51 (*)    All other components within normal limits  LIPASE, BLOOD - Abnormal; Notable for the following components:   Lipase 76 (*)    All other components within normal limits  TROPONIN I (HIGH SENSITIVITY)  TROPONIN I (HIGH SENSITIVITY)    EKG EKG Interpretation  Date/Time:  Wednesday February 27 2023 07:02:11 EDT Ventricular Rate:  85 PR Interval:  145 QRS Duration: 84 QT Interval:  354 QTC Calculation: 421 R Axis:   63 Text Interpretation: Sinus rhythm Confirmed by Lennice Sites ([id number]) on 02/27/2023 7:13:53 AM  Radiology DG Chest Portable 1 View  Result Date: 02/27/2023 CLINICAL DATA:  Chest pain EXAM: PORTABLE CHEST 1 VIEW COMPARISON:  11/07/2022 FINDINGS: Normal heart size and mediastinal contours. No acute infiltrate or edema. No effusion or pneumothorax. No acute osseous findings. IMPRESSION: No active disease. Electronically Signed   By: Jorje Guild M.D.   On: 02/27/2023 07:20    Procedures Procedures    Medications Ordered in ED Medications  alum & mag hydroxide-simeth (MAALOX/MYLANTA) 200-200-20 MG/5ML suspension 30 mL (30 mLs Oral Given 02/27/23 0814)  nitroGLYCERIN  (NITROSTAT) SL tablet 0.4 mg (0.4 mg Sublingual Given 02/27/23 [id number])    ED Course/ Medical Decision Making/ A&P             HEART Score: 3                Medical Decision Making Amount and/or Complexity of Data Reviewed Labs: ordered. Radiology: ordered.  Risk OTC drugs. Prescription drug management. Decision regarding hospitalization.   Darryl Hurley is here with chest pain.  History of hypertension, diabetes, alcohol use.  Unremarkable vitals except for mild high blood pressure.  Pain started overnight.  Thought may be associated to eating.  Does not admit to any heavy alcohol use.  Darryl Hurley feels the pain is sharp and dull.  May be got better with nitroglycerin.  Heart score is 3/4.  EKG shows sinus rhythm.  No ischemic changes.  Differential diagnosis ACS versus MSK pain versus reflux versus pancreatitis or cholecystitis.  Have no concern for dissection or PE.  Will get CBC, BMP, troponin, hepatic function panel, lipase, chest x-ray.  Per my review and interpretation of chest x-ray there is no pneumonia or pneumothorax.  No significant anemia or electrolyte abnormality or kidney injury per my review interpretation of labs.  Troponin within normal limits.  Talked with Dr. Shellia Carwin with cardiology team and we reviewed his past cardiac workup.  Darryl Hurley has had some akinesis of the inferior left ventricle most recently an echocardiogram back in March 2023.  This seems to have been during a neurologic admission.  Has not seen cardiology since 2022.  Darryl Hurley had abnormal echocardiogram then as well but decision was made for medical management instead of heart catheterization as Darryl Hurley was not having any ongoing anginal or chest pain episodes.  Ultimately decision was made to admit to hospitalist.  Cardiology recommended and actually do an MRI with and without contrast of the heart to further evaluate.    Talked with hospitalist team.  Will confirm  plan with cardiology team prior to medicine admit.  Plan is  for echocardiogram and dispo per that.  Repeat troponin normal.  Cardiology and hospitalist have both evaluated the Darryl Hurley.  Echocardiogram unremarkable per radiology.  Will start Darryl Hurley on BiDil.  Will follow-up outpatient. This chart was dictated using voice recognition software.  Despite best efforts to proofread,  errors can occur which can change the documentation meaning.       Final Clinical Impression(s) / ED Diagnoses Final diagnoses:  Chest pain, unspecified type    Rx / DC Orders ED Discharge Orders     None         Lennice Sites, DO 02/27/23 0846    Lennice Sites, DO 02/27/23 1100    Rossy Virag, [place], DO 02/27/23 1228    Lennice Sites, DO 02/27/23 1315

## 2023-02-27 NOTE — Final Consult Note (Addendum)
Patient seen and examined independently in the emergency room after initial consultation by Dr. Ronnald Nian of the ED   62 year old black male HTN, COPD with chronic respiratory failure/2 L oxygen at home DM TY 2 Gout Prior DVT supposed to be on Xarelto-not taking previously Abnormal stress test 01/2018--Echo not visualized on the regular area showed EF 55-60% however some wall motion abnormalities with basal inferior hypokinesis grade 1 diastolic dysfunction Chronic low back pain Chronic EtOH-drinks multiple 40 ounce beers/Day patient shares with me that he had a pint of liquor on 3/26 with his brother and another friend and completed entire 5 This is a habitual occurrence for him he also smokes at least 1 pack/day of [place] cigarettes . 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   Septic arthritis on admission 1216-12/21 after this had washout   hospitalization 3/5 through 02/07/2022-at the time had left-sided weakness with some droop and speech changes-neurological workup showed MRI brain with no acute abnormality MRI left shoulder showed complete supraspinatus tear and had some degeneration     Came to emergency room for CP on 3/27-chest pain was reproducible and seems to be more severe when he first came to the emergency room Patient was given nitro x 1 as well as a GI cocktail and received another nitro before 9 AM and had no chest pain whatsoever at the time of my evaluation of the patient  Workup-sodium 140 potassium 3.6 BUN/creatinine 19/1.1 lipase 76 BNP 136 troponin 11-->12 WBC 6.8 hemoglobin 13 platelet 224 Chest x-ray no active disease    O BP (!) 189/131   Pulse 66   Temp 98.1 F (36.7 C) (Oral)   Resp 20   Ht 5\' 5"  (1.651 m)   Wt 72.6 kg   SpO2 98%   BMI 26.63 kg/m   Coherent black male in no distress looks about stated age Significant  restriction ROM right shoulder with inability to abduct >40 degrees S1-S2 no murmur regular sinus on exam abdomen slightly distended no rebound no guarding no tenderness Patient does have point tenderness to the right lower chest as well as restricted ROM as mentioned and passive ROM to the right shoulder causes some discomfort in the lower chest wall  DDx moderate to severe gastritis,  mild pancreatitis [able to keep down food fluids in the ED] versus musculoskeletal sprain-first troponin 11   Troponins were cycled by the emergency room and second troponin was 21 [place] cardiology who had seen the patient before was consulted-they reviewed the old notes and recommended initially MRI chest because there was some concern in the outpatient setting of this being cardiac sarcoid We obtained echocardiogram unfortunately patient decided to leave AMA and it is hoped that he will represent back if he continues to have chest pain although it is unlikely that this is cardiac I doubt his pancreatitis is severe enough to warrant hospital admission-he was asking to eat and drink and had no episodes of vomiting and I felt he was reasonably stabilized in the emergency room   I appreciate Dr. Ova Freshwater input in this case and I coordinated with him with regards to patient care time and discussed the case personally with Dr. Ronnald Nian   > 85 minutes

## 2023-02-27 NOTE — ED Notes (Signed)
Patient has been to bathroom 3 times so still waiting on UDS since it was added after the bathroom trips

## 2023-02-27 NOTE — Consult Note (Signed)
CARDIOLOGY CONSULT NOTE  Patient ID: Darryl Hurley MRN: [id number] DOB/AGE: February 17, 1961 62 y.o.  Admit date: 02/27/2023 Attending physician: No att. providers found Primary Physician:  Vassie Moment, MD (Inactive) Outpatient Cardiology Provider: Dr. Vernell Leep Inpatient Cardiologist: Rex Kras, DO, [facility]  Reason of consultation: Chest Pain Referring physician: No att. providers found  Chief complaint: right sided chest pain.  HPI:  Darryl Hurley is a 62 y.o. African-American male who presents with a chief complaint of " right-sided chest pain." His past medical history and cardiovascular risk factors include: Hypertension, type 2 diabetes mellitus, cigarette smoking, noncompliance, alcohol abuse.  Started experiencing right-sided chest pain over the anterior precordium at 4 PM on 02/26/2023.  He did not think much of went to sleep.  This morning when he woke up he continued to have right-sided chest pain so he came to the hospital for further evaluation and management.  Initially the intensity was 10 out of 10, reproducible with palpation over the right precordium, not brought on by effort related activities, did not get better with resting.  When he was given sublingual nitroglycerin tablets the symptoms did not improve.  Symptoms have resolved since receiving the GI cocktail this morning.  Patient is adamant to leave AMA.  However, he decided to wait for the echocardiogram.  Patient states that he takes his blood pressure medications on a daily basis.  He does not check his blood pressures at home.  He smokes at least 1 pack/day and drinks 0.5-1 point   ALLERGIES: Allergies  Allergen Reactions   Nsaids Other (See Comments)    Vagale down reaction   Toradol [Ketorolac Tromethamine]     vagale down reaction    PAST MEDICAL HISTORY: Past Medical History:  Diagnosis Date   Alcohol abuse    Anemia    Angina pectoris    Arthritis    Asthma    Chronic lower back pain     COPD (chronic obstructive pulmonary disease) (HCC)    on home O2 2L   Diabetes mellitus without complication (HCC)    DVT (deep venous thrombosis) (HCC)    bilateral   Exertional shortness of breath    Gout    Hypertension    Seizures ([place])     PAST SURGICAL HISTORY: Past Surgical History:  Procedure Laterality Date   INCISION AND DRAINAGE ABSCESS Bilateral 11/19/2019   Procedure: INCISION AND DRAINAGE BILATERAL SEPTIC KNEES, ASPIRATION OF LEFT KNEE;  Surgeon: Shona Needles, MD;  Location: [place];  Service: Orthopedics;  Laterality: Bilateral;   INGUINAL HERNIA REPAIR Bilateral 09/01/2020   Procedure: BILATERAL LAPAROSCOPIC INGUINAL HERNIA REPAIR WITH MESH;  Surgeon: Coralie Keens, MD;  Location: [place];  Service: General;  Laterality: Bilateral;   INSERTION OF MESH Bilateral 09/01/2020   Procedure: INSERTION OF MESH;  Surgeon: Coralie Keens, MD;  Location: [place];  Service: General;  Laterality: Bilateral;   NO PAST SURGERIES      FAMILY HISTORY: The patient's family history includes Asthma in his brother; Depression in his father; Diabetes type II in his mother; Suicidality in his father.   SOCIAL HISTORY:  The patient  reports that he has been smoking cigarettes. He has a 20.00 pack-year smoking history. He has never used smokeless tobacco. He reports current alcohol use. He reports that he does not use drugs.  MEDICATIONS: Current Outpatient Medications  Medication Instructions   acetaminophen (TYLENOL) 650 mg, Oral, Every 6 hours PRN   albuterol (PROVENTIL HFA;VENTOLIN HFA) 108 (90 BASE)  MCG/ACT inhaler 1 puff, Inhalation, Every 6 hours PRN   allopurinol (ZYLOPRIM) 300 mg, Oral, Daily   amLODipine (NORVASC) 10 mg, Oral, Daily   ASPIRIN LOW DOSE 81 MG tablet Take 1 tablet (81 mg total) by mouth daily. Swallow whole.   cloNIDine (CATAPRES) 0.1 mg, Oral, 2 times daily   colchicine 0.6 mg, Oral, Daily PRN   diclofenac Sodium (VOLTAREN) 2 g, Topical, 4 times daily PRN    DULoxetine (CYMBALTA) 30 mg, Oral, Daily   fluorometholone (FML) 0.1 % ophthalmic suspension 1 drop, Both Eyes, Daily   fluticasone (FLONASE) 50 MCG/ACT nasal spray 1 spray, Each Nare, Daily   fluticasone furoate-vilanterol (BREO ELLIPTA) 100-25 MCG/ACT AEPB Inhalation   gabapentin (NEURONTIN) 600 mg, Oral, 3 times daily   HYDROcodone-acetaminophen (NORCO/VICODIN) 5-325 MG tablet Oral   isosorbide-hydrALAZINE (BIDIL) 20-37.5 MG tablet 1 tablet, Oral, 3 times daily   lidocaine (LIDODERM) 5 % 1 patch, Transdermal, Daily   lidocaine (LIDODERM) 5 % 1 patch, Transdermal, Every 24 hours, Remove & Discard patch within 12 hours or as directed by MD   losartan (COZAAR) 100 mg, Oral, Daily   metFORMIN (GLUCOPHAGE) 500 MG tablet Oral   nitroGLYCERIN (NITROSTAT) 0.4 mg, Sublingual, Every 5 min x3 PRN   oxyCODONE (OXY IR/ROXICODONE) 5 mg, Oral, Every 6 hours PRN   pantoprazole (PROTONIX) 40 mg, Oral, Daily   RESTASIS 0.05 % ophthalmic emulsion 1 drop, Both Eyes, Daily   rosuvastatin (CRESTOR) 40 mg, Oral, Daily   SYMBICORT 160-4.5 MCG/ACT inhaler 2 puffs, Inhalation, 2 times daily    REVIEW OF SYSTEMS: Review of Systems  Cardiovascular:  Negative for chest pain, claudication, dyspnea on exertion, irregular heartbeat, leg swelling, near-syncope, orthopnea, palpitations, paroxysmal nocturnal dyspnea and syncope.  Respiratory:  Negative for shortness of breath.   Hematologic/Lymphatic: Negative for bleeding problem.  Musculoskeletal:  Negative for muscle cramps and myalgias.  Neurological:  Negative for dizziness and light-headedness.  All other systems reviewed and are negative.   PHYSICAL EXAMINATION: PHYSICAL EXAM: Temp:  [98.1 F (36.7 C)-98.4 F (36.9 C)] 98.1 F (36.7 C) (03/27 1259) Pulse Rate:  [60-84] 76 (03/27 1259) Resp:  [15-23] 16 (03/27 1259) BP: (147-198)/(105-131) 147/105 (03/27 1259) SpO2:  [93 %-99 %] 99 % (03/27 1259) Weight:  [72.6 kg] 72.6 kg (03/27  0720)  Intake/Output: No intake or output data in the 24 hours ending 02/27/23 1351   Net IO Since Admission: No IO data has been entered for this period [02/27/23 1351]  Weights:     02/27/2023    7:20 AM 01/07/2023    1:17 PM 03/08/2022   12:13 AM  Last 3 Weights  Weight (lbs) 160 lb 160 lb 179 lb 14.3 oz  Weight (kg) 72.576 kg 72.576 kg 81.6 kg   Today's Vitals   02/27/23 1139 02/27/23 1200 02/27/23 1259 02/27/23 1328  BP:  (!) 189/131 (!) 147/105   Pulse:  66 76   Resp:  20 16   Temp: 98.1 F (36.7 C)  98.1 F (36.7 C)   TempSrc: Oral  Oral   SpO2:  98% 99%   Weight:      Height:      PainSc:    0-No pain   Body mass index is 26.63 kg/m.  Physical Exam  Constitutional: No distress.  Age appropriate, hemodynamically stable.   Neck: No JVD present.  Cardiovascular: Normal rate, regular rhythm, S1 normal, S2 normal, intact distal pulses and normal pulses. Exam reveals no gallop, no S3 and no S4.  No murmur heard. Pulmonary/Chest: Effort normal and breath sounds normal. No stridor. He has no wheezes. He has no rales.  Abdominal: Soft. Bowel sounds are normal. He exhibits no distension. There is no abdominal tenderness.  Musculoskeletal:        General: No edema.     Cervical back: Neck supple.  Neurological: He is alert and oriented to person, place, and time. He has intact cranial nerves (2-12).  Skin: Skin is warm and moist.    LAB RESULTS: Chemistry Recent Labs  Lab 02/27/23 0703  NA 140  K 3.6  CL 105  CO2 27  GLUCOSE 155*  BUN 19  CREATININE 1.11  CALCIUM 9.2  PROT 7.0  ALBUMIN 3.6  AST 51*  ALT 30  ALKPHOS 70  BILITOT 0.7  GFRNONAA >60  ANIONGAP 8    Hematology Recent Labs  Lab 02/27/23 0703  WBC 6.8  RBC 4.47  HGB 13.5  HCT 42.1  MCV 94.2  MCH 30.2  MCHC 32.1  RDW 15.8*  PLT 224   High Sensitivity Troponin:   Recent Labs  Lab 02/27/23 0703 02/27/23 0903  TROPONINIHS 11 12     Cardiac EnzymesNo results for input(s):  "TROPONINI" in the last 168 hours. No results for input(s): "TROPIPOC" in the last 168 hours.  BNP Recent Labs  Lab 02/27/23 0703  BNP 136.5*    DDimer No results for input(s): "DDIMER" in the last 168 hours.  Hemoglobin A1c:  Lab Results  Component Value Date   HGBA1C 6.8 (H) 02/05/2022   MPG 148.46 02/05/2022   TSH No results for input(s): "TSH" in the last 8760 hours. Lipid Panel  Lab Results  Component Value Date   CHOL 146 02/05/2022   HDL 79 02/05/2022   LDLCALC 47 02/05/2022   TRIG 102 02/05/2022   CHOLHDL 1.8 02/05/2022   Drugs of Abuse     Component Value Date/Time   LABOPIA NONE DETECTED 02/27/2023 1123   COCAINSCRNUR NONE DETECTED 02/27/2023 1123   LABBENZ NONE DETECTED 02/27/2023 1123   AMPHETMU NONE DETECTED 02/27/2023 1123   THCU NONE DETECTED 02/27/2023 1123   LABBARB NONE DETECTED 02/27/2023 1123      CARDIAC DATABASE: EKG: 02/27/2023: Sinus rhythm, 85 bpm, subtle lateral ST depression cannot entirely rule out ischemia.  Echocardiogram: 02/05/2022  1. Left ventricular ejection fraction, by estimation, is 55 to 60%. The  left ventricle has normal function. The left ventricle demonstrates  regional wall motion abnormalities with basal inferior hypokinesis. There  is mild left ventricular hypertrophy.  Left ventricular diastolic parameters are consistent with Grade I  diastolic dysfunction (impaired relaxation).   2. Right ventricular systolic function is normal. The right ventricular  size is normal. There is normal pulmonary artery systolic pressure. The  estimated right ventricular systolic pressure is 0000000 mmHg.   3. The aortic valve is tricuspid. Aortic valve regurgitation is not  visualized. No aortic stenosis is present.   4. The mitral valve is normal in structure. No evidence of mitral valve  regurgitation. No evidence of mitral stenosis.   5. The inferior vena cava is normal in size with greater than 50%  respiratory variability, suggesting  right atrial pressure of 3 mmHg.   6. Negative bubble study, no evidence for PFO or ASD.   02/27/2023: Pending  Stress Testing:  Lexiscan Tetrofosmin stress test 02/27/2021: 1 Day Rest/Stress Protocol. Stress EKG is non-diagnostic, as this is pharmacological stress test using Lexiscan. Medium size, mild intensity perfusion defect suggestive of reversible  ischemia in the LCx/RCA distribution. Small size, mild intensity, fixed perfusion defect involving the basal inferior and inferolateral segments consistent with diaphragmatic attenuation. TID ratio: 0.93 Left ventricular size: Dilated (EDV 129 cc). Calculated LVEF 46% with mild hypokinesis of the inferior and inferolateral segments. Intermediate risk study, clinical correlation requested. No prior studies available for comparison.  IMPRESSION & RECOMMENDATIONS: Darryl Hurley is a 62 y.o. African-American male whose past medical history and cardiovascular risk factors include: Hypertension, type 2 diabetes mellitus, cigarette smoking, noncompliance, alcohol abuse.  Impression:  Precordial pain. Hypertensive crisis. Diabetes mellitus type 2. Cigarette smoking. Noncompliance. Alcohol abuse  Plan:  Precordial pain Based on symptoms likely noncardiac EKG does not show underlying myocardial injury pattern Echocardiogram performed this morning notes preserved LVEF without any obvious regional wall motion abnormalities, no significant valvular heart disease. High sensitive troponin negative x 2 Last point consumed yesterday night, lipase elevated, cannot entirely rule out pancreatitis or gallbladder pathology given right anterior precordial pain/RUQ.  Chest pain could also be secondary to esophageal spasms and/or pathology given the extensive alcohol consumption. Most recent myocardial perfusion imaging studies from March 2022 independently reviewed. Educated him on importance of better blood pressure management. Patient does not  want to stay for additional testing. Will arrange outpatient follow-up. Patient is aware of coming back to the ED if symptoms resurface or worsen.  Hypertensive crisis: Medications reconciled. Discontinue metoprolol. Start BiDil 20/37.5 mg p.o. 3 times daily Have asked him to keep a log of his blood pressures. Follow-up outpatient with PCP or primary cardiology for further up titration of medical therapy  Alcohol abuse: Drinks 0.5-1 pint per day. Consumed 1 pint yesterday. Will check urine drug screen. Blood alcohol level added to morning labs Reemphasized the importance of complete cessation of alcohol.  Cigarette smoking: Consumes 1 pack/day. Reemphasized importance of complete smoking cessation.  Noncompliance: Last follow-up on 04/06/2021 at which time 20-month follow-up was requested.  Patient is asked to follow-up with Dr. Virgina Jock in the next 2 weeks.  If the office did not reach out to him within a week he is encouraged to call us back to set up this appointment.  Coronation of care with ED provider Dr. Ronnald Nian, medicine provider Dr. Verlon Au, nursing staff and echo tech.   Total encounter time 88 minutes. *Total Encounter Time as defined by the Centers for Medicare and Medicaid Services includes, in addition to the face-to-face time of a patient visit (documented in the note above) non-face-to-face time: obtaining and reviewing outside history, ordering and reviewing medications, tests or procedures, care coordination (communications with other health care professionals or caregivers) and documentation in the medical record.  Patient's questions and concerns were addressed to his satisfaction. He voices understanding of the instructions provided during this encounter.   This note was created using a voice recognition software as a result there may be grammatical errors inadvertently enclosed that do not reflect the nature of this encounter. Every attempt is made to correct  such errors.  Mechele Claude [facility]  Pager:  [phone] Office: [phone] 02/27/2023, 1:51 PM

## 2023-02-27 NOTE — ED Triage Notes (Signed)
Pt arrived with [facility] for R sided chest pain, intermittent radiation to arm and back  Took own nitro pta; EMS gave 324mg  ASA Emesis enroute , given 4mg  zofran   18g IV L AC  Bp 186/120 Cbg 171, 99% RA

## 2023-02-27 NOTE — ED Notes (Signed)
Snacks provided per patient request.  Called echo no time given when they will be down.

## 2023-03-07 ENCOUNTER — Ambulatory Visit: Payer: Medicaid Other | Admitting: Internal Medicine

## 2023-03-07 NOTE — Progress Notes (Signed)
No show

## 2023-03-23 ENCOUNTER — Emergency Department (HOSPITAL_COMMUNITY)
Admission: EM | Admit: 2023-03-23 | Discharge: 2023-03-23 | Disposition: A | Discharge status: Home / Self Care | Payer: Medicaid Other | Attending: Emergency Medicine | Admitting: Emergency Medicine

## 2023-03-23 ENCOUNTER — Encounter (HOSPITAL_COMMUNITY): Payer: Self-pay

## 2023-03-23 ENCOUNTER — Other Ambulatory Visit: Payer: Self-pay

## 2023-03-23 ENCOUNTER — Emergency Department (HOSPITAL_COMMUNITY): Payer: Medicaid Other

## 2023-03-23 DIAGNOSIS — J029 Acute pharyngitis, unspecified: Secondary | ICD-10-CM | POA: Diagnosis present

## 2023-03-23 DIAGNOSIS — M25572 Pain in left ankle and joints of left foot: Secondary | ICD-10-CM | POA: Insufficient documentation

## 2023-03-23 DIAGNOSIS — K0889 Other specified disorders of teeth and supporting structures: Secondary | ICD-10-CM | POA: Diagnosis not present

## 2023-03-23 DIAGNOSIS — Z7982 Long term (current) use of aspirin: Secondary | ICD-10-CM | POA: Insufficient documentation

## 2023-03-23 LAB — GROUP A STREP BY PCR: Group A Strep by PCR: NOT DETECTED

## 2023-03-23 MED ORDER — ACETAMINOPHEN 500 MG PO TABS
1000.0000 mg | ORAL_TABLET | Freq: Once | ORAL | Status: AC
  Administered 2023-03-23: 1000 mg via ORAL
  Filled 2023-03-23: qty 2

## 2023-03-23 MED ORDER — AMOXICILLIN 500 MG PO CAPS: 500.0000 mg | ORAL_CAPSULE | Freq: Three times a day (TID) | ORAL | 0 refills | Status: AC

## 2023-03-23 NOTE — ED Notes (Signed)
Sandwich provided per pt request

## 2023-03-23 NOTE — ED Triage Notes (Signed)
Patient complains of 1 day of sore throat and ongoing left ankle pain that he thinks related to his gout. Patient able to handle secretions, NAD

## 2023-03-23 NOTE — Discharge Instructions (Signed)
Return for any problem.  ?

## 2023-03-23 NOTE — ED Provider Notes (Signed)
[place] EMERGENCY DEPARTMENT AT [facility] Provider Note   CSN: [id number] Arrival date & time: 03/23/23  1015     History  No chief complaint on file.   Darryl Hurley is a 62 y.o. male.  62 year old male with prior medical history as detailed below presents for evaluation.  Patient complains of sore throat.  Symptoms began earlier today.  He also feels somewhat hoarse.  He denies fever.  He denies any difficulty swallowing.  He also complains of mild pain in his left ankle.  This is consistent with his prior episodes of gout.    The history is provided by the patient and medical records.       Home Medications Prior to Admission medications   Medication Sig Start Date End Date Taking? Authorizing Provider  amoxicillin (AMOXIL) 500 MG capsule Take 1 capsule (500 mg total) by mouth 3 (three) times daily. 03/23/23  Yes Wynetta Fines, MD  acetaminophen (TYLENOL) 325 MG tablet Take 2 tablets (650 mg total) by mouth every 6 (six) hours as needed. Patient taking differently: Take 325 mg by mouth every 6 (six) hours as needed for moderate pain. 05/20/19   Lamptey, Britta Mccreedy, MD  albuterol (PROVENTIL HFA;VENTOLIN HFA) 108 (90 BASE) MCG/ACT inhaler Inhale 1 puff into the lungs every 6 (six) hours as needed for wheezing or shortness of breath.    [provider]  allopurinol (ZYLOPRIM) 300 MG tablet Take 300 mg by mouth daily. 02/02/21   [provider]  amLODipine (NORVASC) 10 MG tablet Take 10 mg by mouth daily. 08/06/18   [provider]  ASPIRIN LOW DOSE 81 MG tablet Take 1 tablet (81 mg total) by mouth daily. Swallow whole. 06/08/22   Patwardhan, Anabel Bene, MD  cloNIDine (CATAPRES) 0.1 MG tablet Take 0.1 mg by mouth 2 (two) times daily. 08/15/18   [provider]  colchicine 0.6 MG tablet Take 0.6 mg by mouth daily as needed (gout flare). 08/15/20   [provider]  diclofenac Sodium (VOLTAREN) 1 % GEL Apply 2 g topically 4 (four)  times daily as needed (For Knee Pain). 02/07/22   Atway, Rayann N, DO  DULoxetine (CYMBALTA) 30 MG capsule Take 1 capsule (30 mg total) by mouth daily. 02/08/22 03/10/22  Atway, Rayann N, DO  fluorometholone (FML) 0.1 % ophthalmic suspension Place 1 drop into both eyes daily. 12/01/21   [provider]  fluticasone (FLONASE) 50 MCG/ACT nasal spray Place 1 spray into both nostrils daily. 02/21/21   [provider]  fluticasone furoate-vilanterol (BREO ELLIPTA) 100-25 MCG/ACT AEPB Inhale into the lungs. 06/14/22   [provider]  gabapentin (NEURONTIN) 600 MG tablet Take 600 mg by mouth 3 (three) times daily. 09/12/20   [provider]  HYDROcodone-acetaminophen (NORCO/VICODIN) 5-325 MG tablet Take by mouth.    [provider]  isosorbide-hydrALAZINE (BIDIL) 20-37.5 MG tablet Take 1 tablet by mouth 3 (three) times daily. 02/27/23 03/29/23  Curatolo, Adam, DO  lidocaine (LIDODERM) 5 % Place 1 patch onto the skin daily. 02/21/21   [provider]  lidocaine (LIDODERM) 5 % Place 1 patch onto the skin daily. Remove & Discard patch within 12 hours or as directed by MD 01/18/23   Nicanor Alcon, April, MD  losartan (COZAAR) 100 MG tablet Take 100 mg by mouth daily. 08/13/20   [provider]  metFORMIN (GLUCOPHAGE) 500 MG tablet Take by mouth. 07/02/22   [provider]  nitroGLYCERIN (NITROSTAT) 0.4 MG SL tablet Place 1  tablet (0.4 mg total) under the tongue every 5 (five) minutes x 3 doses as needed for chest pain. 02/11/21   Joy, Shawn C, PA-C  oxyCODONE (OXY IR/ROXICODONE) 5 MG immediate release tablet Take 5 mg by mouth every 6 (six) hours as needed for severe pain.    [provider]  pantoprazole (PROTONIX) 40 MG tablet Take 40 mg by mouth daily.    [provider]  RESTASIS 0.05 % ophthalmic emulsion Place 1 drop into both eyes daily. 12/01/21   [provider]  rosuvastatin (CRESTOR) 40 MG tablet Take 40 mg by mouth daily.     [provider]  SYMBICORT 160-4.5 MCG/ACT inhaler Inhale 2 puffs into the lungs 2 (two) times daily. 11/05/19   [provider]  labetalol (NORMODYNE) 200 MG tablet Take 1 tablet (200 mg total) by mouth 2 (two) times daily. Patient not taking: Reported on 03/25/2019 02/16/19 05/20/19  Elder Negus, MD      Allergies    Nsaids and Toradol [ketorolac tromethamine]    Review of Systems   Review of Systems  All other systems reviewed and are negative.   Physical Exam Updated Vital Signs BP (!) 155/98 (BP Location: Right Arm)   Pulse 60   Temp 98 F (36.7 C) (Oral)   Resp 16   SpO2 98%  Physical Exam Vitals and nursing note reviewed.  Constitutional:      General: He is not in acute distress.    Appearance: Normal appearance. He is well-developed.  HENT:     Head: Normocephalic and atraumatic.     Mouth/Throat:     Mouth: Mucous membranes are moist.     Pharynx: Oropharynx is clear. No oropharyngeal exudate or posterior oropharyngeal erythema.     Comments: Normal posterior pharynx.  No tonsillar exudate or mass.  No evidence of PTA.  No erythema.  Normal phonation except for mild hoarseness.  Patient is handling of secretions without difficulty.  Patient taking p.o. well during evaluation. Eyes:     Conjunctiva/sclera: Conjunctivae normal.     Pupils: Pupils are equal, round, and reactive to light.  Neck:     Vascular: No carotid bruit.  Cardiovascular:     Rate and Rhythm: Normal rate and regular rhythm.     Heart sounds: Normal heart sounds.  Pulmonary:     Effort: Pulmonary effort is normal. No respiratory distress.     Breath sounds: Normal breath sounds.  Abdominal:     General: There is no distension.     Palpations: Abdomen is soft.     Tenderness: There is no abdominal tenderness.  Musculoskeletal:        General: No deformity. Normal range of motion.     Cervical back: Normal range of motion and neck supple.  Skin:    General: Skin  is warm and dry.  Neurological:     General: No focal deficit present.     Mental Status: He is alert and oriented to person, place, and time.     ED Results / Procedures / Treatments   Labs (all labs ordered are listed, but only abnormal results are displayed) Labs Reviewed  GROUP A STREP BY PCR    EKG None  Radiology DG Chest 2 View  Result Date: 03/23/2023 CLINICAL DATA:  Sore throat and difficulty swallowing. EXAM: CHEST - 2 VIEW COMPARISON:  02/27/2023 FINDINGS: The cardiac silhouette, mediastinal and hilar contours are within normal limits. The lungs are clear. No pleural effusions  or pulmonary lesions. No pneumothorax. The bony thorax is intact. IMPRESSION: No acute cardiopulmonary findings. Electronically Signed   By: Rudie Meyer M.D.   On: 03/23/2023 11:05    Procedures Procedures    Medications Ordered in ED Medications  acetaminophen (TYLENOL) tablet 1,000 mg (1,000 mg Oral Given 03/23/23 1218)    ED Course/ Medical Decision Making/ A&P                             Medical Decision Making Amount and/or Complexity of Data Reviewed Radiology: ordered.  Risk OTC drugs. Prescription drug management.    Medical Screen Complete  This patient presented to the ED with complaint of sore throat   This complaint involves an extensive number of treatment options. The initial differential diagnosis includes, but is not limited to, viral pharyngitis,.  This presentation is: Acute, Self-Limited, Previously Undiagnosed, Uncertain Prognosis, and Complicated  Patient is complaining of mild sore throat entirely consistent with likely viral pharyngitis.  Rapid strep is negative.  Patient is taking p.o. well and appears to be nontoxic.  Patient does have poor dentition.  Midway through evaluation he is requesting course of antibiotics to treat possible early dental infection.  Patient complains of increasing pain to the incisors on the left upper jaw.  This has been an  ongoing issue apparently for several days.  Patient denies fever or other complaint related to this.  Importance of close follow-up was stressed.  Strict return precautions given and understood.  Additional history obtained:  External records from outside sources obtained and reviewed including prior ED visits and prior Inpatient records.   Problem List / ED Course:  Sore throat, dental pain   Reevaluation:  After the interventions noted above, I reevaluated the patient and found that they have: improved   Disposition:  After consideration of the diagnostic results and the patients response to treatment, I feel that the patent would benefit from close outpatient follow-up.          Final Clinical Impression(s) / ED Diagnoses Final diagnoses:  Sore throat  Pain, dental    Rx / DC Orders ED Discharge Orders          Ordered    amoxicillin (AMOXIL) 500 MG capsule  3 times daily        03/23/23 1307              Wynetta Fines, MD 03/23/23 1525

## 2023-07-17 NOTE — Telephone Encounter (Signed)
done

## 2023-07-30 NOTE — Progress Notes (Signed)
No action done

## 2023-08-10 NOTE — Progress Notes (Signed)
This encounter was created in error - please disregard.

## 2024-02-19 ENCOUNTER — Other Ambulatory Visit: Payer: Self-pay | Admitting: Family

## 2024-02-19 DIAGNOSIS — F17200 Nicotine dependence, unspecified, uncomplicated: Secondary | ICD-10-CM

## 2024-03-06 ENCOUNTER — Ambulatory Visit: Admitting: Podiatry

## 2024-03-06 ENCOUNTER — Encounter: Payer: Self-pay | Admitting: Podiatry

## 2024-03-06 DIAGNOSIS — M79675 Pain in left toe(s): Secondary | ICD-10-CM

## 2024-03-06 DIAGNOSIS — E1165 Type 2 diabetes mellitus with hyperglycemia: Secondary | ICD-10-CM | POA: Diagnosis not present

## 2024-03-06 DIAGNOSIS — M79674 Pain in right toe(s): Secondary | ICD-10-CM

## 2024-03-06 DIAGNOSIS — B351 Tinea unguium: Secondary | ICD-10-CM | POA: Diagnosis not present

## 2024-03-06 NOTE — Progress Notes (Signed)
 This patient returns to my office for at risk foot care.  This patient requires this care by a professional since this patient will be at risk due to having type 2 diabetes and CVA. This patient is unable to cut nails himself since the patient cannot reach his nails.These nails are painful walking and wearing shoes.  This patient presents for at risk foot care today.  General Appearance  Alert, conversant and in no acute stress.  Vascular  Dorsalis pedis and posterior tibial  pulses are palpable  bilaterally.  Capillary return is within normal limits  bilaterally. Temperature is within normal limits  bilaterally.  Neurologic  Senn-Weinstein monofilament wire test within normal limits  bilaterally. Muscle power within normal limits bilaterally.  Nails Thick disfigured discolored nails with subungual debris  from hallux to fifth toes bilaterally. No evidence of bacterial infection or drainage bilaterally.  Orthopedic  No limitations of motion  feet .  No crepitus or effusions noted.  No bony pathology or digital deformities noted.  Skin  normotropic skin with no porokeratosis noted bilaterally.  No signs of infections or ulcers noted.     Onychomycosis  Pain in right toes  Pain in left toes  Consent was obtained for treatment procedures.   Mechanical debridement of nails 1-5  bilaterally performed with a nail nipper.  Filed with dremel without incident.    Return office visit    3 months                  Told patient to return for periodic foot care and evaluation due to potential at risk complications.   Helane Gunther DPM

## 2024-03-11 ENCOUNTER — Other Ambulatory Visit: Payer: Self-pay | Admitting: Family

## 2024-03-11 DIAGNOSIS — F17200 Nicotine dependence, unspecified, uncomplicated: Secondary | ICD-10-CM

## 2024-03-12 ENCOUNTER — Inpatient Hospital Stay: Admission: RE | Admit: 2024-03-12 | Source: Ambulatory Visit

## 2024-03-17 ENCOUNTER — Ambulatory Visit
Admission: RE | Admit: 2024-03-17 | Discharge: 2024-03-17 | Disposition: A | Discharge status: Home / Self Care | Source: Ambulatory Visit | Attending: Family | Admitting: Family

## 2024-03-17 DIAGNOSIS — F17200 Nicotine dependence, unspecified, uncomplicated: Secondary | ICD-10-CM

## 2024-06-12 ENCOUNTER — Ambulatory Visit: Admitting: Podiatrist

## 2024-06-16 ENCOUNTER — Ambulatory Visit: Admitting: Orthopaedic Surgery

## 2024-06-25 ENCOUNTER — Emergency Department (HOSPITAL_COMMUNITY)
Admission: EM | Admit: 2024-06-25 | Discharge: 2024-06-25 | Disposition: A | Discharge status: Home / Self Care | Attending: Emergency Medicine | Admitting: Emergency Medicine

## 2024-06-25 ENCOUNTER — Other Ambulatory Visit: Payer: Self-pay

## 2024-06-25 ENCOUNTER — Encounter (HOSPITAL_COMMUNITY): Payer: Self-pay

## 2024-06-25 ENCOUNTER — Emergency Department (HOSPITAL_COMMUNITY)

## 2024-06-25 DIAGNOSIS — X58XXXA Exposure to other specified factors, initial encounter: Secondary | ICD-10-CM | POA: Diagnosis not present

## 2024-06-25 DIAGNOSIS — M25511 Pain in right shoulder: Secondary | ICD-10-CM | POA: Diagnosis present

## 2024-06-25 DIAGNOSIS — Z7982 Long term (current) use of aspirin: Secondary | ICD-10-CM | POA: Insufficient documentation

## 2024-06-25 DIAGNOSIS — E119 Type 2 diabetes mellitus without complications: Secondary | ICD-10-CM | POA: Diagnosis not present

## 2024-06-25 DIAGNOSIS — Z7984 Long term (current) use of oral hypoglycemic drugs: Secondary | ICD-10-CM | POA: Insufficient documentation

## 2024-06-25 DIAGNOSIS — S46911A Strain of unspecified muscle, fascia and tendon at shoulder and upper arm level, right arm, initial encounter: Secondary | ICD-10-CM | POA: Diagnosis not present

## 2024-06-25 MED ORDER — LIDOCAINE 5 % EX PTCH
1.0000 | MEDICATED_PATCH | CUTANEOUS | Status: DC
  Administered 2024-06-25: 1 via TRANSDERMAL
  Filled 2024-06-25: qty 1

## 2024-06-25 MED ORDER — LIDOCAINE 5 % EX PTCH: 1.0000 | MEDICATED_PATCH | CUTANEOUS | 0 refills | Status: AC

## 2024-06-25 MED ORDER — METHOCARBAMOL 500 MG PO TABS: 500.0000 mg | ORAL_TABLET | Freq: Two times a day (BID) | ORAL | 0 refills | Status: AC

## 2024-06-25 MED ORDER — METHOCARBAMOL 500 MG PO TABS
500.0000 mg | ORAL_TABLET | Freq: Once | ORAL | Status: AC
  Administered 2024-06-25: 500 mg via ORAL
  Filled 2024-06-25: qty 1

## 2024-06-25 MED ORDER — OXYCODONE-ACETAMINOPHEN 5-325 MG PO TABS
1.0000 | ORAL_TABLET | Freq: Once | ORAL | Status: AC
  Administered 2024-06-25: 1 via ORAL
  Filled 2024-06-25: qty 1

## 2024-06-25 NOTE — ED Triage Notes (Signed)
 Arrives [facility] from home with nontraumatic right shoulder pain. Says he has no idea what happened to it.   EMS says probable intoxication that he denies.

## 2024-06-25 NOTE — Discharge Instructions (Signed)
 It appears that you sprained your shoulder from pulling on the weedeater string.  The x-ray looks okay but it may be very sore for the next few days.  In addition to the Tylenol  you take at home as needed a prescription for a muscle relaxer was sent to your pharmacy as well.  It will be important for you to follow-up with a specialist if you continue to have pain as they may have to do an injection in your shoulder.

## 2024-06-25 NOTE — ED Provider Notes (Signed)
 [place] EMERGENCY DEPARTMENT AT [facility] Provider Note   CSN: [id number] Arrival date & time: 06/25/24  8061     Patient presents with: Shoulder Pain   Darryl Hurley is a 63 y.o. male.   Patient is a 63 year old male with a history of diabetes, gout, alcohol use who is presenting today with complaints of right shoulder pain.  Reports that it hurts him intermittently but today he was starting a weedeater and the pain became severe.  It is painful anytime he tries to move his right arm but he denies any numbness or tingling in the hand or fingers.  Normal left shoulder pain.  No swelling or redness.  He reports every time he moves his shoulder it just hurts really bad.  He has not taken any medications prior to arrival.  The history is provided by the patient.  Shoulder Pain      Prior to Admission medications   Medication Sig Start Date End Date Taking? Authorizing Provider  lidocaine  (LIDODERM ) 5 % Place 1 patch onto the skin daily. Remove & Discard patch within 12 hours or as directed by MD 06/25/24  Yes Doretha Folks, MD  methocarbamol  (ROBAXIN ) 500 MG tablet Take 1 tablet (500 mg total) by mouth 2 (two) times daily. 06/25/24  Yes Doretha Folks, MD  acetaminophen  (TYLENOL ) 325 MG tablet Take 2 tablets (650 mg total) by mouth every 6 (six) hours as needed. Patient taking differently: Take 325 mg by mouth every 6 (six) hours as needed for moderate pain. 05/20/19   Lamptey, Aleene KIDD, MD  albuterol  (PROVENTIL  HFA;VENTOLIN  HFA) 108 (90 BASE) MCG/ACT inhaler Inhale 1 puff into the lungs every 6 (six) hours as needed for wheezing or shortness of breath.    [provider]  allopurinol  (ZYLOPRIM ) 300 MG tablet Take 300 mg by mouth daily. 02/02/21   [provider]  amLODipine  (NORVASC ) 10 MG tablet Take 10 mg by mouth daily. 08/06/18   [provider]  amoxicillin  (AMOXIL ) 500 MG capsule Take 1 capsule (500 mg total) by mouth 3 (three) times  daily. 03/23/23   Laurice Maude BROCKS, MD  ASPIRIN  LOW DOSE 81 MG tablet Take 1 tablet (81 mg total) by mouth daily. Swallow whole. 06/08/22   Patwardhan, Newman PARAS, MD  cloNIDine  (CATAPRES ) 0.1 MG tablet Take 0.1 mg by mouth 2 (two) times daily. 08/15/18   [provider]  colchicine  0.6 MG tablet Take 0.6 mg by mouth daily as needed (gout flare). 08/15/20   [provider]  diclofenac  Sodium (VOLTAREN ) 1 % GEL Apply 2 g topically 4 (four) times daily as needed (For Knee Pain). 02/07/22   Atway, Rayann N, DO  DULoxetine  (CYMBALTA ) 30 MG capsule Take 1 capsule (30 mg total) by mouth daily. 02/08/22 03/10/22  Atway, Rayann N, DO  fluorometholone (FML) 0.1 % ophthalmic suspension Place 1 drop into both eyes daily. 12/01/21   [provider]  fluticasone  (FLONASE) 50 MCG/ACT nasal spray Place 1 spray into both nostrils daily. 02/21/21   [provider]  fluticasone  furoate-vilanterol (BREO ELLIPTA ) 100-25 MCG/ACT AEPB Inhale into the lungs. 06/14/22   [provider]  gabapentin  (NEURONTIN ) 600 MG tablet Take 600 mg by mouth 3 (three) times daily. 09/12/20   [provider]  HYDROcodone -acetaminophen  (NORCO/VICODIN) 5-325 MG tablet Take by mouth.    [provider]  losartan  (COZAAR ) 100 MG tablet Take 100 mg by mouth daily. 08/13/20   [provider]  metFORMIN  (GLUCOPHAGE ) 500 MG  tablet Take by mouth. 07/02/22   [provider]  nitroGLYCERIN  (NITROSTAT ) 0.4 MG SL tablet Place 1 tablet (0.4 mg total) under the tongue every 5 (five) minutes x 3 doses as needed for chest pain. 02/11/21   Joy, Shawn C, PA-C  oxyCODONE  (OXY IR/ROXICODONE ) 5 MG immediate release tablet Take 5 mg by mouth every 6 (six) hours as needed for severe pain.    [provider]  pantoprazole  (PROTONIX ) 40 MG tablet Take 40 mg by mouth daily.    [provider]  RESTASIS 0.05 % ophthalmic emulsion Place 1 drop into both eyes daily. 12/01/21   [provider]  rosuvastatin  (CRESTOR ) 40 MG tablet Take 40 mg by mouth daily.    [provider]  SYMBICORT 160-4.5 MCG/ACT inhaler Inhale 2 puffs into the lungs 2 (two) times daily. 11/05/19   [provider]  labetalol  (NORMODYNE ) 200 MG tablet Take 1 tablet (200 mg total) by mouth 2 (two) times daily. Patient not taking: Reported on 03/25/2019 02/16/19 05/20/19  Elmira Newman PARAS, MD    Allergies: Nsaids and Toradol  [ketorolac  tromethamine ]    Review of Systems  Updated Vital Signs BP (!) 164/90 (BP Location: Left Arm)   Pulse 62   Temp 98.4 F (36.9 C)   Resp 18   Ht 5' 5 (1.651 m)   Wt 72.6 kg   SpO2 99%   BMI 26.63 kg/m   Physical Exam Vitals and nursing note reviewed.  Constitutional:      General: He is not in acute distress.    Appearance: He is well-developed.  HENT:     Head: Normocephalic and atraumatic.  Eyes:     Conjunctiva/sclera: Conjunctivae normal.     Pupils: Pupils are equal, round, and reactive to light.  Cardiovascular:     Rate and Rhythm: Normal rate and regular rhythm.     Pulses: Normal pulses.     Heart sounds: No murmur heard. Pulmonary:     Effort: Pulmonary effort is normal. No respiratory distress.     Breath sounds: Normal breath sounds. No wheezing or rales.  Abdominal:     General: There is no distension.     Palpations: Abdomen is soft.     Tenderness: There is no abdominal tenderness. There is no guarding or rebound.  Musculoskeletal:        General: Tenderness present.     Right shoulder: Tenderness present. Decreased range of motion.     Cervical back: Normal range of motion and neck supple.     Comments: Tenderness with palpation in the right rotator cuff and with extension, abduction, adduction both actively and passively.  Pain with internal and external passive range of motion but patient will not do it actively due to pain.  No redness or swelling noted of the shoulder.  Bicep is intact  Skin:    General:  Skin is warm and dry.     Findings: No erythema or rash.  Neurological:     Mental Status: He is alert and oriented to person, place, and time. Mental status is at baseline.  Psychiatric:        Behavior: Behavior normal.     (all labs ordered are listed, but only abnormal results are displayed) Labs Reviewed - No data to display  EKG: None  Radiology: DG Shoulder Right Result Date: 06/25/2024 CLINICAL DATA:  pain EXAM: RIGHT SHOULDER - 2+ VIEW COMPARISON:  None Available. FINDINGS: There is no evidence of fracture or  dislocation. Humeral head cystic degenerative changes. Acromioclavicular joint degenerative changes. There is no evidence of severe arthropathy or other focal bone abnormality. Soft tissues are unremarkable. IMPRESSION: No acute displaced fracture or dislocation. Electronically Signed   By: Morgane  Naveau M.D.   On: 06/25/2024 20:56     Procedures   Medications Ordered in the ED  lidocaine  (LIDODERM ) 5 % 1 patch (has no administration in time range)  methocarbamol  (ROBAXIN ) tablet 500 mg (500 mg Oral Given 06/25/24 2209)  oxyCODONE -acetaminophen  (PERCOCET/ROXICET) 5-325 MG per tablet 1 tablet (1 tablet Oral Given 06/25/24 2209)                                    Medical Decision Making Amount and/or Complexity of Data Reviewed Radiology: ordered and independent interpretation performed. Decision-making details documented in ED Course.  Risk Prescription drug management.   Patient presenting today with complaint of right shoulder pain.  No evidence concerning for septic joint, gout.  Pulses are normal and neurologically intact.  Suspect an issue with the rotator cuff due to pulling the weedeater string.  No evidence of tendon tear. I have independently visualized and interpreted pt's images today.  Plain film is negative for fracture or dislocation.  Discussed findings with patient and his need to follow-up with orthopedics in the future if he has ongoing pain and  avoid any heavy lifting with that arm.      Final diagnoses:  Strain of right shoulder, initial encounter    ED Discharge Orders          Ordered    methocarbamol  (ROBAXIN ) 500 MG tablet  2 times daily        06/25/24 2248    lidocaine  (LIDODERM ) 5 %  Every 24 hours        06/25/24 2251               Doretha Folks, MD 06/25/24 2251

## 2024-06-30 ENCOUNTER — Ambulatory Visit: Admitting: Orthopaedic Surgery

## 2024-09-01 ENCOUNTER — Ambulatory Visit

## 2024-09-04 ENCOUNTER — Ambulatory Visit (INDEPENDENT_AMBULATORY_CARE_PROVIDER_SITE_OTHER)

## 2024-09-04 ENCOUNTER — Ambulatory Visit

## 2024-09-04 VITALS — Ht 65.0 in | Wt 160.0 lb

## 2024-09-04 DIAGNOSIS — M79674 Pain in right toe(s): Secondary | ICD-10-CM

## 2024-09-04 DIAGNOSIS — M7752 Other enthesopathy of left foot: Secondary | ICD-10-CM

## 2024-09-04 DIAGNOSIS — S90112A Contusion of left great toe without damage to nail, initial encounter: Secondary | ICD-10-CM | POA: Diagnosis not present

## 2024-09-04 DIAGNOSIS — B351 Tinea unguium: Secondary | ICD-10-CM | POA: Diagnosis not present

## 2024-09-04 DIAGNOSIS — M79675 Pain in left toe(s): Secondary | ICD-10-CM

## 2024-09-04 DIAGNOSIS — E1165 Type 2 diabetes mellitus with hyperglycemia: Secondary | ICD-10-CM

## 2024-09-04 NOTE — Progress Notes (Signed)
 Subjective:  Patient ID: Darryl Hurley, male    DOB: 1961/09/21,  MRN: [id number]  Chief Complaint  Patient presents with   Toe Injury    RM 10 Stomp 1st toe L foot, whole foot hurting also RFC    63 y.o. male presents with the above complaint. He states that he stubbed his left 1st toe approximately 10 days ago and has continued pain.  He denies any treatment to the area.  He denies any open wounds, lesions, signs of infection to the area.  He does state that his toenails are elongated, painful.  Review of Systems: Negative except as noted in the HPI. Denies N/V/F/Ch.  Past Medical History:  Diagnosis Date   Alcohol abuse    Anemia    Angina pectoris    Arthritis    Asthma    Chronic lower back pain    COPD (chronic obstructive pulmonary disease) (HCC)    on home O2 2L   Diabetes mellitus without complication (HCC)    DVT (deep venous thrombosis) (HCC)    bilateral   Exertional shortness of breath    Gout    Hypertension    Seizures (HCC)     Current Outpatient Medications:    acetaminophen  (TYLENOL ) 325 MG tablet, Take 2 tablets (650 mg total) by mouth every 6 (six) hours as needed. (Patient taking differently: Take 325 mg by mouth every 6 (six) hours as needed for moderate pain (pain score 4-6).), Disp:  , Rfl:    albuterol  (PROVENTIL  HFA;VENTOLIN  HFA) 108 (90 BASE) MCG/ACT inhaler, Inhale 1 puff into the lungs every 6 (six) hours as needed for wheezing or shortness of breath., Disp: , Rfl:    allopurinol  (ZYLOPRIM ) 300 MG tablet, Take 300 mg by mouth daily., Disp: , Rfl:    amLODipine  (NORVASC ) 10 MG tablet, Take 10 mg by mouth daily., Disp: , Rfl: 2   amoxicillin  (AMOXIL ) 500 MG capsule, Take 1 capsule (500 mg total) by mouth 3 (three) times daily., Disp: 21 capsule, Rfl: 0   ASPIRIN  LOW DOSE 81 MG tablet, Take 1 tablet (81 mg total) by mouth daily. Swallow whole., Disp: 90 tablet, Rfl: 3   cloNIDine  (CATAPRES ) 0.1 MG tablet, Take 0.1 mg by mouth 2 (two) times daily.,  Disp: , Rfl: 3   colchicine  0.6 MG tablet, Take 0.6 mg by mouth daily as needed (gout flare)., Disp: , Rfl:    diclofenac  Sodium (VOLTAREN ) 1 % GEL, Apply 2 g topically 4 (four) times daily as needed (For Knee Pain)., Disp: 100 g, Rfl: 0   fluorometholone (FML) 0.1 % ophthalmic suspension, Place 1 drop into both eyes daily., Disp: , Rfl:    fluticasone  (FLONASE) 50 MCG/ACT nasal spray, Place 1 spray into both nostrils daily., Disp: , Rfl:    fluticasone  furoate-vilanterol (BREO ELLIPTA ) 100-25 MCG/ACT AEPB, Inhale into the lungs., Disp: , Rfl:    gabapentin  (NEURONTIN ) 600 MG tablet, Take 600 mg by mouth 3 (three) times daily., Disp: , Rfl:    HYDROcodone -acetaminophen  (NORCO/VICODIN) 5-325 MG tablet, Take by mouth., Disp: , Rfl:    lidocaine  (LIDODERM ) 5 %, Place 1 patch onto the skin daily. Remove & Discard patch within 12 hours or as directed by MD, Disp: 30 patch, Rfl: 0   losartan  (COZAAR ) 100 MG tablet, Take 100 mg by mouth daily., Disp: , Rfl:    metFORMIN  (GLUCOPHAGE ) 500 MG tablet, Take by mouth., Disp: , Rfl:    methocarbamol  (ROBAXIN ) 500 MG tablet, Take 1 tablet (500  mg total) by mouth 2 (two) times daily., Disp: 20 tablet, Rfl: 0   nitroGLYCERIN  (NITROSTAT ) 0.4 MG SL tablet, Place 1 tablet (0.4 mg total) under the tongue every 5 (five) minutes x 3 doses as needed for chest pain., Disp: 30 tablet, Rfl: 3   oxyCODONE  (OXY IR/ROXICODONE ) 5 MG immediate release tablet, Take 5 mg by mouth every 6 (six) hours as needed for severe pain., Disp: , Rfl:    pantoprazole  (PROTONIX ) 40 MG tablet, Take 40 mg by mouth daily., Disp: , Rfl:    RESTASIS 0.05 % ophthalmic emulsion, Place 1 drop into both eyes daily., Disp: , Rfl:    rosuvastatin  (CRESTOR ) 40 MG tablet, Take 40 mg by mouth daily., Disp: , Rfl:    SYMBICORT 160-4.5 MCG/ACT inhaler, Inhale 2 puffs into the lungs 2 (two) times daily., Disp: , Rfl:    DULoxetine  (CYMBALTA ) 30 MG capsule, Take 1 capsule (30 mg total) by mouth daily., Disp: 30  capsule, Rfl: 0  Social History   Tobacco Use  Smoking Status Every Day   Current packs/day: 0.50   Average packs/day: 0.5 packs/day for 40.0 years (20.0 ttl pk-yrs)   Types: Cigarettes  Smokeless Tobacco Never  Tobacco Comments   05/12/2013 'eased off smoking since last month    Allergies  Allergen Reactions   Nsaids Other (See Comments)    Vagale down reaction   Toradol  [Ketorolac  Tromethamine ]     vagale down reaction   Objective:  There were no vitals filed for this visit. Body mass index is 26.63 kg/m. Constitutional Well developed. Well nourished. Oriented to person, place, and time.  Vascular Dorsalis pedis pulses palpable bilaterally. Posterior tibial pulses palpable bilaterally. Capillary refill normal to all digits.  No cyanosis or clubbing noted. Pedal hair growth normal.  Neurologic Normal speech. Epicritic sensation to light touch grossly present bilaterally. Negative tinel sign at tarsal tunnel bilaterally.   Dermatologic Skin texture and turgor are within normal limits.  No open wounds. Nails 1 through 5 bilaterally are thickened, dystrophic, crumbly and painful to palpation.  Musculoskeletal: 5 out of 5 muscle strength all major pedal muscle groups.  No major contributing deformity.  His left first toe is tender to palpation at the metatarsophalangeal joint, proximal phalanx, interphalangeal joint.  There is no change in pain between palpation and range of motion.  Minimal edema, erythema consistent with injury.   Radiographs: Taken and reviewed.  3 views of the left foot were taken today.  These do not demonstrate any fracture, acute pathology to the big toe.  There are erosions at the first metatarsophalangeal joint consistent with chronic gout.  Joint space maintained.  Rectus foot shape.  Assessment:   1. Capsulitis of metatarsophalangeal (MTP) joint of left foot   2. Pain due to onychomycosis of toenails of both feet   3. Type 2 diabetes mellitus  with hyperglycemia, without long-term current use of insulin  (HCC)   4. Contusion of left great toe without damage to nail, initial encounter    Plan:  - Patient was evaluated and treated and all questions answered.  Contusion of toe - Based on imaging, clinical presentation and history believe that his pathology is most consistent with a contusion of his left hallux following it being stubbed.  I do not see any signs of fracture or opening.  I discussed that this will likely improve.  -Due to continued pain, including with range of motion, I dispensed the patient a surgical shoe today to limit range of  motion at the first metatarsophalangeal joint.  He is going to wear the surgical shoe until he feels more comfortable ambulating with standard shoe gear.  Onychomycosis -Patient has pain to palpation of toenails 1 through 5 bilaterally.  These are thickened, dystrophic, elongated, crumbly and texture.  They were sharply debrided today without incident.  The patient did express satisfaction following debridement.   RTC 3 months with Dr. Loreda Prentice Ovens, DPM AACFAS Fellowship Trained Podiatric Surgeon [facility]

## 2024-09-18 NOTE — Progress Notes (Deleted)
 Cardiology Office Note:    Date:  09/18/2024   ID:  Darryl Hurley, DOB Jan 01, 1961, MRN [id number]  PCP:  Duwaine Annabella SAILOR, FNP   [facility] Providers Cardiologist:  None { Click to update primary MD,subspecialty MD or APP then REFRESH:1}    Referring MD: Duwaine Annabella SAILOR, FNP   No chief complaint on file. ***  History of Present Illness:    Darryl Hurley is a 64 y.o. male is seen at the request of Annabella Duwaine for evaluation of aortic and coronary atherosclerosis noted on CT. He has a history of DM, COPD on home oxygent, DVT, HTN, Seizures, and tobacco and Etoh abuse.   Past Medical History:  Diagnosis Date   Alcohol abuse    Anemia    Angina pectoris    Arthritis    Asthma    Chronic lower back pain    COPD (chronic obstructive pulmonary disease) (HCC)    on home O2 2L   Diabetes mellitus without complication (HCC)    DVT (deep venous thrombosis) (HCC)    bilateral   Exertional shortness of breath    Gout    Hypertension    Seizures (HCC)     Past Surgical History:  Procedure Laterality Date   INCISION AND DRAINAGE ABSCESS Bilateral 11/19/2019   Procedure: INCISION AND DRAINAGE BILATERAL SEPTIC KNEES, ASPIRATION OF LEFT KNEE;  Surgeon: Kendal Franky SQUIBB, MD;  Location: [facility] OR;  Service: Orthopedics;  Laterality: Bilateral;   INGUINAL HERNIA REPAIR Bilateral 09/01/2020   Procedure: BILATERAL LAPAROSCOPIC INGUINAL HERNIA REPAIR WITH MESH;  Surgeon: Vernetta Berg, MD;  Location: [facility] OR;  Service: General;  Laterality: Bilateral;   INSERTION OF MESH Bilateral 09/01/2020   Procedure: INSERTION OF MESH;  Surgeon: Vernetta Berg, MD;  Location: [facility] OR;  Service: General;  Laterality: Bilateral;   NO PAST SURGERIES      Current Medications: No outpatient medications have been marked as taking for the 09/22/24 encounter (Appointment) with [place], Tevin Shillingford M, MD.     Allergies:   Nsaids and Toradol  [ketorolac  tromethamine ]   Social History    Socioeconomic History   Marital status: Single    Spouse name: Not on file   Number of children: 2   Years of education: 11th   Highest education level: Not on file  Occupational History    Employer: OTHER    Comment: disability  Tobacco Use   Smoking status: Every Day    Current packs/day: 0.50    Average packs/day: 0.5 packs/day for 40.0 years (20.0 ttl pk-yrs)    Types: Cigarettes   Smokeless tobacco: Never   Tobacco comments:    05/12/2013 'eased off smoking since last month  Vaping Use   Vaping status: Never Used  Substance and Sexual Activity   Alcohol use: Yes    Comment: 2 pts of beer a night, 2 pts of liquor a week   Drug use: No    Types: Crack cocaine   Sexual activity: Never  Other Topics Concern   Not on file  Social History Narrative   ** Merged History Encounter **       Patient lives at home with friend.   Caffeine Use: none   Social Drivers of Corporate investment banker Strain: Not on File (03/22/2022)   Received from [employer]    Financial Resource Strain: 0  Food Insecurity: At Risk (10/03/2023)   Received from [employer]  Within the past 12 months, you worried that your food would run out before you got money to buy more.: 2  Transportation Needs: At Risk (10/03/2023)   Received from [facility] Needs    In the past 12 months, has lack of transportation kept you from medical appointments, meetings, work or from getting things needed for daily living?: 2  Physical Activity: Not on File (03/22/2022)   Received from [facility]   Physical Activity    Physical Activity: 0  Stress: Not on File (03/22/2022)   Received from [facility]   Stress    Stress: 0  Social Connections: Not on File (08/22/2023)   Received from [employer]   Social Connections    Connectedness: 0     Family History: The patient's ***family history includes Asthma in his brother; Depression in his father; Diabetes type II in his  mother; Suicidality in his father.  ROS:   Please see the history of present illness.    *** All other systems reviewed and are negative.  EKGs/Labs/Other Studies Reviewed:    The following studies were reviewed today: ***      Recent Labs: No results found for requested labs within last 365 days.  Recent Lipid Panel    Component Value Date/Time   CHOL 146 02/05/2022 0337   TRIG 102 02/05/2022 0337   HDL 79 02/05/2022 0337   CHOLHDL 1.8 02/05/2022 0337   VLDL 20 02/05/2022 0337   LDLCALC 47 02/05/2022 0337     Risk Assessment/Calculations:   {Does this patient have ATRIAL FIBRILLATION?[phone]}  No BP recorded.  {Refresh Note OR Click here to enter BP  :1}***         Physical Exam:    VS:  There were no vitals taken for this visit.    Wt Readings from Last 3 Encounters:  09/04/24 160 lb (72.6 kg)  06/25/24 160 lb (72.6 kg)  02/27/23 160 lb (72.6 kg)     GEN: *** Well nourished, well developed in no acute distress HEENT: Normal NECK: No JVD; No carotid bruits LYMPHATICS: No lymphadenopathy CARDIAC: ***RRR, no murmurs, rubs, gallops RESPIRATORY:  Clear to auscultation without rales, wheezing or rhonchi  ABDOMEN: Soft, non-tender, non-distended MUSCULOSKELETAL:  No edema; No deformity  SKIN: Warm and dry NEUROLOGIC:  Alert and oriented x 3 PSYCHIATRIC:  Normal affect   ASSESSMENT:    No diagnosis found. PLAN:    In order of problems listed above:  ***      {Are you ordering a CV Procedure (e.g. stress test, cath, DCCV, TEE, etc)?   Press F2        :[phone]}    Medication Adjustments/Labs and Tests Ordered: Current medicines are reviewed at length with the patient today.  Concerns regarding medicines are outlined above.  No orders of the defined types were placed in this encounter.  No orders of the defined types were placed in this encounter.   There are no Patient Instructions on file for this visit.   Signed, Kyl Givler [place], MD   09/18/2024 4:48 PM    [facility]

## 2024-09-22 ENCOUNTER — Ambulatory Visit: Attending: Cardiology | Admitting: Cardiology

## 2024-09-23 ENCOUNTER — Encounter: Payer: Self-pay | Admitting: Cardiology

## 2024-12-04 ENCOUNTER — Ambulatory Visit

## 2024-12-11 ENCOUNTER — Ambulatory Visit

## 2024-12-31 ENCOUNTER — Other Ambulatory Visit: Payer: Self-pay | Admitting: Family

## 2024-12-31 DIAGNOSIS — Z139 Encounter for screening, unspecified: Secondary | ICD-10-CM

## 2024-12-31 DIAGNOSIS — Z122 Encounter for screening for malignant neoplasm of respiratory organs: Secondary | ICD-10-CM

## 2025-01-22 ENCOUNTER — Ambulatory Visit: Admitting: Cardiology
